# Patient Record
Sex: Male | Born: 1975 | Race: Black or African American | Hispanic: No | Marital: Married | Smoking: Former smoker
Health system: Southern US, Community
[De-identification: ages and names within clinical notes are randomized; demographics above are authoritative.]

## PROBLEM LIST (undated history)

## (undated) DIAGNOSIS — N189 Chronic kidney disease, unspecified: Secondary | ICD-10-CM

## (undated) DIAGNOSIS — M549 Dorsalgia, unspecified: Secondary | ICD-10-CM

## (undated) DIAGNOSIS — I499 Cardiac arrhythmia, unspecified: Secondary | ICD-10-CM

## (undated) DIAGNOSIS — E11621 Type 2 diabetes mellitus with foot ulcer: Secondary | ICD-10-CM

## (undated) DIAGNOSIS — G4730 Sleep apnea, unspecified: Secondary | ICD-10-CM

## (undated) DIAGNOSIS — R51 Headache: Secondary | ICD-10-CM

## (undated) DIAGNOSIS — H409 Unspecified glaucoma: Secondary | ICD-10-CM

## (undated) DIAGNOSIS — I10 Essential (primary) hypertension: Secondary | ICD-10-CM

## (undated) DIAGNOSIS — G709 Myoneural disorder, unspecified: Secondary | ICD-10-CM

## (undated) DIAGNOSIS — K219 Gastro-esophageal reflux disease without esophagitis: Secondary | ICD-10-CM

## (undated) DIAGNOSIS — E785 Hyperlipidemia, unspecified: Secondary | ICD-10-CM

## (undated) DIAGNOSIS — M1990 Unspecified osteoarthritis, unspecified site: Secondary | ICD-10-CM

## (undated) HISTORY — DX: Essential (primary) hypertension: I10

## (undated) HISTORY — DX: Dorsalgia, unspecified: M54.9

## (undated) HISTORY — DX: Myoneural disorder, unspecified: G70.9

## (undated) HISTORY — DX: Type 2 diabetes mellitus with foot ulcer: E11.621

## (undated) HISTORY — DX: Hyperlipidemia, unspecified: E78.5

## (undated) HISTORY — DX: Sleep apnea, unspecified: G47.30

## (undated) HISTORY — DX: Unspecified glaucoma: H40.9

## (undated) HISTORY — PX: OTHER SURGICAL HISTORY: SHX169

## (undated) HISTORY — DX: Chronic kidney disease, unspecified: N18.9

## (undated) HISTORY — DX: Headache: R51

## (undated) HISTORY — DX: Cardiac arrhythmia, unspecified: I49.9

## (undated) HISTORY — DX: Unspecified osteoarthritis, unspecified site: M19.90

## (undated) HISTORY — DX: Gastro-esophageal reflux disease without esophagitis: K21.9

---

## 1998-02-25 ENCOUNTER — Emergency Department (HOSPITAL_COMMUNITY): Admission: EM | Admit: 1998-02-25 | Discharge: 1998-02-25 | Payer: Self-pay | Admitting: Emergency Medicine

## 1998-09-29 ENCOUNTER — Emergency Department (HOSPITAL_COMMUNITY): Admission: EM | Admit: 1998-09-29 | Discharge: 1998-09-29 | Payer: Self-pay | Admitting: Emergency Medicine

## 1999-05-07 ENCOUNTER — Encounter (HOSPITAL_COMMUNITY): Admit: 1999-05-07 | Discharge: 1999-05-09 | Payer: Self-pay | Admitting: Pediatrics

## 1999-06-29 ENCOUNTER — Emergency Department (HOSPITAL_COMMUNITY): Admission: EM | Admit: 1999-06-29 | Discharge: 1999-06-29 | Payer: Self-pay | Admitting: Emergency Medicine

## 1999-10-18 ENCOUNTER — Emergency Department (HOSPITAL_COMMUNITY): Admission: EM | Admit: 1999-10-18 | Discharge: 1999-10-18 | Payer: Self-pay | Admitting: *Deleted

## 1999-10-19 ENCOUNTER — Emergency Department (HOSPITAL_COMMUNITY): Admission: EM | Admit: 1999-10-19 | Discharge: 1999-10-19 | Payer: Self-pay | Admitting: Emergency Medicine

## 2000-01-27 ENCOUNTER — Emergency Department (HOSPITAL_COMMUNITY): Admission: EM | Admit: 2000-01-27 | Discharge: 2000-01-27 | Payer: Self-pay | Admitting: Emergency Medicine

## 2000-02-25 ENCOUNTER — Emergency Department (HOSPITAL_COMMUNITY): Admission: EM | Admit: 2000-02-25 | Discharge: 2000-02-25 | Payer: Self-pay | Admitting: Emergency Medicine

## 2000-02-25 ENCOUNTER — Encounter: Payer: Self-pay | Admitting: Emergency Medicine

## 2000-08-01 ENCOUNTER — Emergency Department (HOSPITAL_COMMUNITY): Admission: EM | Admit: 2000-08-01 | Discharge: 2000-08-02 | Payer: Self-pay | Admitting: Emergency Medicine

## 2000-09-29 ENCOUNTER — Encounter: Payer: Self-pay | Admitting: Emergency Medicine

## 2000-09-29 ENCOUNTER — Emergency Department (HOSPITAL_COMMUNITY): Admission: EM | Admit: 2000-09-29 | Discharge: 2000-09-29 | Payer: Self-pay | Admitting: Emergency Medicine

## 2000-11-25 ENCOUNTER — Emergency Department (HOSPITAL_COMMUNITY): Admission: EM | Admit: 2000-11-25 | Discharge: 2000-11-25 | Payer: Self-pay | Admitting: *Deleted

## 2001-01-30 ENCOUNTER — Emergency Department (HOSPITAL_COMMUNITY): Admission: EM | Admit: 2001-01-30 | Discharge: 2001-01-30 | Payer: Self-pay | Admitting: Emergency Medicine

## 2001-02-01 ENCOUNTER — Emergency Department (HOSPITAL_COMMUNITY): Admission: EM | Admit: 2001-02-01 | Discharge: 2001-02-02 | Payer: Self-pay | Admitting: Emergency Medicine

## 2001-02-01 ENCOUNTER — Encounter: Payer: Self-pay | Admitting: Internal Medicine

## 2001-02-02 ENCOUNTER — Encounter: Payer: Self-pay | Admitting: Internal Medicine

## 2001-09-06 ENCOUNTER — Emergency Department (HOSPITAL_COMMUNITY): Admission: EM | Admit: 2001-09-06 | Discharge: 2001-09-06 | Payer: Self-pay | Admitting: Emergency Medicine

## 2002-01-28 ENCOUNTER — Emergency Department (HOSPITAL_COMMUNITY): Admission: EM | Admit: 2002-01-28 | Discharge: 2002-01-28 | Payer: Self-pay | Admitting: *Deleted

## 2002-11-24 ENCOUNTER — Encounter: Payer: Self-pay | Admitting: Emergency Medicine

## 2002-11-24 ENCOUNTER — Emergency Department (HOSPITAL_COMMUNITY): Admission: EM | Admit: 2002-11-24 | Discharge: 2002-11-24 | Payer: Self-pay | Admitting: Emergency Medicine

## 2003-08-05 ENCOUNTER — Emergency Department (HOSPITAL_COMMUNITY): Admission: EM | Admit: 2003-08-05 | Discharge: 2003-08-05 | Payer: Self-pay | Admitting: Emergency Medicine

## 2003-08-14 ENCOUNTER — Encounter: Admission: RE | Admit: 2003-08-14 | Discharge: 2003-08-14 | Payer: Self-pay | Admitting: Internal Medicine

## 2004-05-16 ENCOUNTER — Emergency Department (HOSPITAL_COMMUNITY): Admission: EM | Admit: 2004-05-16 | Discharge: 2004-05-16 | Payer: Self-pay | Admitting: Emergency Medicine

## 2004-05-18 ENCOUNTER — Ambulatory Visit: Payer: Self-pay | Admitting: Family Medicine

## 2004-05-24 ENCOUNTER — Ambulatory Visit: Payer: Self-pay | Admitting: Family Medicine

## 2004-06-07 ENCOUNTER — Ambulatory Visit: Payer: Self-pay | Admitting: Family Medicine

## 2004-06-21 ENCOUNTER — Encounter: Admission: RE | Admit: 2004-06-21 | Discharge: 2004-09-19 | Payer: Self-pay | Admitting: Family Medicine

## 2004-11-14 ENCOUNTER — Ambulatory Visit: Payer: Self-pay | Admitting: Family Medicine

## 2005-05-26 ENCOUNTER — Ambulatory Visit: Payer: Self-pay | Admitting: Family Medicine

## 2006-01-22 ENCOUNTER — Ambulatory Visit: Payer: Self-pay | Admitting: Family Medicine

## 2006-11-21 ENCOUNTER — Ambulatory Visit: Payer: Self-pay | Admitting: Family Medicine

## 2006-11-21 DIAGNOSIS — S335XXA Sprain of ligaments of lumbar spine, initial encounter: Secondary | ICD-10-CM | POA: Insufficient documentation

## 2006-11-21 DIAGNOSIS — E1140 Type 2 diabetes mellitus with diabetic neuropathy, unspecified: Secondary | ICD-10-CM

## 2006-11-21 DIAGNOSIS — Z794 Long term (current) use of insulin: Secondary | ICD-10-CM

## 2006-11-21 DIAGNOSIS — I10 Essential (primary) hypertension: Secondary | ICD-10-CM

## 2007-06-25 ENCOUNTER — Emergency Department (HOSPITAL_COMMUNITY): Admission: EM | Admit: 2007-06-25 | Discharge: 2007-06-26 | Payer: Self-pay | Admitting: Emergency Medicine

## 2008-04-15 ENCOUNTER — Telehealth: Payer: Self-pay | Admitting: Family Medicine

## 2008-04-17 ENCOUNTER — Ambulatory Visit: Payer: Self-pay | Admitting: Family Medicine

## 2008-04-17 LAB — CONVERTED CEMR LAB
Bilirubin Urine: NEGATIVE
Nitrite: NEGATIVE
Protein, U semiquant: NEGATIVE
Urobilinogen, UA: 0.2

## 2008-04-21 ENCOUNTER — Ambulatory Visit: Payer: Self-pay | Admitting: Family Medicine

## 2008-04-21 DIAGNOSIS — J45909 Unspecified asthma, uncomplicated: Secondary | ICD-10-CM | POA: Insufficient documentation

## 2008-04-21 DIAGNOSIS — R51 Headache: Secondary | ICD-10-CM | POA: Insufficient documentation

## 2008-04-21 DIAGNOSIS — R519 Headache, unspecified: Secondary | ICD-10-CM | POA: Insufficient documentation

## 2008-04-21 DIAGNOSIS — K219 Gastro-esophageal reflux disease without esophagitis: Secondary | ICD-10-CM | POA: Insufficient documentation

## 2008-04-21 LAB — CONVERTED CEMR LAB
ALT: 25 units/L (ref 0–53)
AST: 18 units/L (ref 0–37)
Alkaline Phosphatase: 81 units/L (ref 39–117)
Basophils Absolute: 0 10*3/uL (ref 0.0–0.1)
Bilirubin, Direct: 0.3 mg/dL (ref 0.0–0.3)
CO2: 30 meq/L (ref 19–32)
Chloride: 94 meq/L — ABNORMAL LOW (ref 96–112)
Cholesterol: 137 mg/dL (ref 0–200)
LDL Cholesterol: 71 mg/dL (ref 0–99)
Lymphocytes Relative: 33 % (ref 12.0–46.0)
MCHC: 35.6 g/dL (ref 30.0–36.0)
Microalb, Ur: 0.9 mg/dL (ref 0.0–1.9)
Monocytes Relative: 5.1 % (ref 3.0–12.0)
Neutrophils Relative %: 61.4 % (ref 43.0–77.0)
RBC: 5.27 M/uL (ref 4.22–5.81)
RDW: 11.7 % (ref 11.5–14.6)
Sodium: 134 meq/L — ABNORMAL LOW (ref 135–145)
TSH: 1.38 microintl units/mL (ref 0.35–5.50)
Total Bilirubin: 1.2 mg/dL (ref 0.3–1.2)
Total CHOL/HDL Ratio: 5.2
Triglycerides: 197 mg/dL — ABNORMAL HIGH (ref 0–149)
VLDL: 39 mg/dL (ref 0–40)

## 2008-05-07 ENCOUNTER — Ambulatory Visit: Payer: Self-pay | Admitting: Family Medicine

## 2008-05-07 LAB — CONVERTED CEMR LAB: Blood Glucose, Fingerstick: 381

## 2009-08-09 ENCOUNTER — Emergency Department (HOSPITAL_COMMUNITY): Admission: EM | Admit: 2009-08-09 | Discharge: 2009-08-09 | Payer: Self-pay | Admitting: Emergency Medicine

## 2010-04-15 ENCOUNTER — Telehealth: Payer: Self-pay | Admitting: Hospitalist

## 2010-04-15 NOTE — Telephone Encounter (Signed)
Pt wants to have cpx in march..... Can't wait till next available in April..... Okay to work into schedule?Marland Kitchen... Pts contact # [phone].

## 2010-04-18 ENCOUNTER — Other Ambulatory Visit: Payer: Self-pay | Admitting: Family Medicine

## 2010-04-18 DIAGNOSIS — Z0000 Encounter for general adult medical examination without abnormal findings: Secondary | ICD-10-CM

## 2010-04-18 LAB — POCT URINALYSIS DIPSTICK
Leukocytes, UA: NEGATIVE
Nitrite, UA: NEGATIVE
Urobilinogen, UA: 0.2

## 2010-04-18 LAB — HEPATIC FUNCTION PANEL
ALT: 16 U/L (ref 0–53)
Bilirubin, Direct: 0.2 mg/dL (ref 0.0–0.3)
Total Bilirubin: 0.5 mg/dL (ref 0.3–1.2)

## 2010-04-18 LAB — LIPID PANEL
Total CHOL/HDL Ratio: 4
Triglycerides: 124 mg/dL (ref 0.0–149.0)

## 2010-04-18 LAB — CBC WITH DIFFERENTIAL/PLATELET
Basophils Relative: 0.3 % (ref 0.0–3.0)
Eosinophils Absolute: 0.1 10*3/uL (ref 0.0–0.7)
MCHC: 34 g/dL (ref 30.0–36.0)
MCV: 94.2 fl (ref 78.0–100.0)
Monocytes Absolute: 0.5 10*3/uL (ref 0.1–1.0)
Neutro Abs: 4.9 10*3/uL (ref 1.4–7.7)
Neutrophils Relative %: 50.5 % (ref 43.0–77.0)
RBC: 5.22 Mil/uL (ref 4.22–5.81)

## 2010-04-18 LAB — BASIC METABOLIC PANEL
Chloride: 98 mEq/L (ref 96–112)
Creatinine, Ser: 0.9 mg/dL (ref 0.4–1.5)
Potassium: 4.5 mEq/L (ref 3.5–5.1)
Sodium: 133 mEq/L — ABNORMAL LOW (ref 135–145)

## 2010-04-18 LAB — MICROALBUMIN / CREATININE URINE RATIO: Microalb, Ur: 5.3 mg/dL — ABNORMAL HIGH (ref 0.0–1.9)

## 2010-04-18 LAB — TSH: TSH: 0.99 u[IU]/mL (ref 0.35–5.50)

## 2010-04-18 NOTE — Telephone Encounter (Signed)
Okay to set this up

## 2010-04-21 ENCOUNTER — Encounter: Payer: Self-pay | Admitting: Family Medicine

## 2010-04-22 ENCOUNTER — Ambulatory Visit (INDEPENDENT_AMBULATORY_CARE_PROVIDER_SITE_OTHER): Payer: Self-pay | Admitting: Family Medicine

## 2010-04-22 ENCOUNTER — Encounter: Payer: Self-pay | Admitting: Family Medicine

## 2010-04-22 VITALS — BP 120/84 | Temp 98.2°F | Ht 69.0 in | Wt 238.0 lb

## 2010-04-22 DIAGNOSIS — E119 Type 2 diabetes mellitus without complications: Secondary | ICD-10-CM

## 2010-04-22 DIAGNOSIS — Z0000 Encounter for general adult medical examination without abnormal findings: Secondary | ICD-10-CM

## 2010-04-22 MED ORDER — CYCLOBENZAPRINE HCL 10 MG PO TABS: 10.0000 mg | ORAL_TABLET | Freq: Three times a day (TID) | ORAL | Status: AC | PRN

## 2010-04-22 MED ORDER — METFORMIN HCL 1000 MG PO TABS: 1000.0000 mg | ORAL_TABLET | Freq: Two times a day (BID) | ORAL | Status: DC

## 2010-04-22 MED ORDER — GLIPIZIDE 10 MG PO TABS: 10.0000 mg | ORAL_TABLET | Freq: Two times a day (BID) | ORAL | Status: DC

## 2010-04-22 NOTE — Progress Notes (Signed)
  Subjective:    Patient ID: Danny Fuller, male    DOB: January 09, 1976, 35 y.o.   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 I have not seen him for 2 years. At our last visit his DM was out of control, and his A1c on 05-07-08 was 13.6. He was not following any sort of diet at that time. He now wants to re-establish with [name] and to work hard to get his diabetes under control. His labs on 04-18-10 showed an A1c of 12.6, although otherwise they were normal. He has made some dietary changes with the help[ of his wife, and he has lost a lot of weight in the past 6 months. He feels fine. He has not taken any meds except Flexeril for the past 2 years.    Review of Systems  Constitutional: Negative.   HENT: Negative.   Eyes: Negative.   Respiratory: Negative.   Cardiovascular: Negative.   Gastrointestinal: Negative.   Genitourinary: Negative.   Musculoskeletal: Negative.   Skin: Negative.   Neurological: Negative.   Hematological: Negative.   Psychiatric/Behavioral: Negative.        Objective:   Physical Exam  Constitutional: He is oriented to person, place, and time. He appears well-developed and well-nourished. No distress.  HENT:  Head: Normocephalic and atraumatic.  Right Ear: External ear normal.  Left Ear: External ear normal.  Nose: Nose normal.  Mouth/Throat: Oropharynx is clear and moist. No oropharyngeal exudate.  Eyes: Conjunctivae and EOM are normal. Pupils are equal, round, and reactive to light. Right eye exhibits no discharge. Left eye exhibits no discharge. No scleral icterus.  Neck: Neck supple. No JVD present. No tracheal deviation present. No thyromegaly present.  Cardiovascular: Normal rate, regular rhythm, normal heart sounds and intact distal pulses.  Exam reveals no gallop and no friction rub.   No murmur heard. Pulmonary/Chest: Effort normal and breath sounds normal. No respiratory distress. He has no wheezes. He has no  rales. He exhibits no tenderness.  Abdominal: Soft. Bowel sounds are normal. He exhibits no distension and no mass. There is no tenderness. There is no rebound and no guarding.  Musculoskeletal: Normal range of motion. He exhibits no edema and no tenderness.  Lymphadenopathy:    He has no cervical adenopathy.  Neurological: He is alert and oriented to person, place, and time. He has normal reflexes. No cranial nerve deficit. He exhibits normal muscle tone. Coordination normal.  Skin: Skin is warm and dry. No rash noted. He is not diaphoretic. No erythema. No pallor.  Psychiatric: He has a normal mood and affect. His behavior is normal. Judgment and thought content normal.          Assessment & Plan:  He is passed for 2 years on the DOT certificate. He will continue diet and exercise. We will start on metformin and glipizide. Recheck in one month

## 2010-05-01 LAB — URINE CULTURE
Colony Count: NO GROWTH
Culture: NO GROWTH

## 2010-05-01 LAB — URINE MICROSCOPIC-ADD ON

## 2010-05-01 LAB — URINALYSIS, ROUTINE W REFLEX MICROSCOPIC
Glucose, UA: >1000 mg/dL — AB
Hgb urine dipstick: NEGATIVE
Leukocytes, UA: NEGATIVE
Specific Gravity, Urine: >1.046 — ABNORMAL HIGH (ref 1.005–1.030)
pH: 6 (ref 5.0–8.0)

## 2010-05-27 ENCOUNTER — Ambulatory Visit: Payer: Self-pay | Admitting: Family Medicine

## 2010-05-31 ENCOUNTER — Ambulatory Visit (INDEPENDENT_AMBULATORY_CARE_PROVIDER_SITE_OTHER): Payer: Self-pay | Admitting: Family Medicine

## 2010-05-31 ENCOUNTER — Encounter: Payer: Self-pay | Admitting: Family Medicine

## 2010-05-31 VITALS — BP 140/96 | HR 98 | Temp 98.4°F | Wt 241.0 lb

## 2010-05-31 DIAGNOSIS — E119 Type 2 diabetes mellitus without complications: Secondary | ICD-10-CM

## 2010-05-31 NOTE — Progress Notes (Signed)
  Subjective:    Patient ID: Danny Fuller, male    DOB: [birth year], 35 y.o.   MRN: [id number]  HPI Here to follow up on diabetes. We saw him 6 weeks ago after an absence of 2 years. His A1c was 12.6. We started him on Metformin and Glipizide at that time. He has been taking these regularly. He has not been able to afford a glucometer, so his glucose has not been checked since he was here. He feels fine except for some sleepiness at times. After talking to him, he has not made any real changes to his diet.    Review of Systems  Constitutional: Negative.   Respiratory: Negative.   Cardiovascular: Negative.        Objective:   Physical Exam  Constitutional: He appears well-developed and well-nourished.  Cardiovascular: Normal rate, regular rhythm, normal heart sounds and intact distal pulses.   Pulmonary/Chest: Effort normal and breath sounds normal.          Assessment & Plan:  I spent 30 minutes talking to him about the serious nature of his diabetes. He needs to make some major changes in his diet and exercise. 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 He will work on this and see [name] in 2 months.

## 2010-08-05 ENCOUNTER — Ambulatory Visit: Payer: Self-pay | Admitting: Family Medicine

## 2010-09-02 ENCOUNTER — Ambulatory Visit: Payer: Self-pay | Admitting: Family Medicine

## 2010-09-02 DIAGNOSIS — Z0289 Encounter for other administrative examinations: Secondary | ICD-10-CM

## 2010-11-09 ENCOUNTER — Emergency Department (HOSPITAL_COMMUNITY)
Admission: EM | Admit: 2010-11-09 | Discharge: 2010-11-09 | Disposition: A | Discharge status: Home / Self Care | Payer: Self-pay | Attending: Emergency Medicine | Admitting: Emergency Medicine

## 2010-11-09 ENCOUNTER — Emergency Department (HOSPITAL_COMMUNITY): Payer: Self-pay

## 2010-11-09 DIAGNOSIS — IMO0002 Reserved for concepts with insufficient information to code with codable children: Secondary | ICD-10-CM | POA: Insufficient documentation

## 2010-11-09 DIAGNOSIS — X500XXA Overexertion from strenuous movement or load, initial encounter: Secondary | ICD-10-CM | POA: Insufficient documentation

## 2011-07-05 ENCOUNTER — Telehealth: Payer: Self-pay | Admitting: Family Medicine

## 2011-07-07 ENCOUNTER — Encounter: Payer: Self-pay | Admitting: Family Medicine

## 2011-07-07 ENCOUNTER — Ambulatory Visit (INDEPENDENT_AMBULATORY_CARE_PROVIDER_SITE_OTHER): Payer: Self-pay | Admitting: Family Medicine

## 2011-07-07 VITALS — BP 146/86 | HR 105 | Temp 98.2°F | Wt 232.0 lb

## 2011-07-07 DIAGNOSIS — M79609 Pain in unspecified limb: Secondary | ICD-10-CM

## 2011-07-07 DIAGNOSIS — E119 Type 2 diabetes mellitus without complications: Secondary | ICD-10-CM

## 2011-07-07 DIAGNOSIS — M79673 Pain in unspecified foot: Secondary | ICD-10-CM

## 2011-07-07 NOTE — Progress Notes (Signed)
  Subjective:    Patient ID: Danny Fuller, male    DOB: 1975/09/18, 36 y.o.   MRN: [id number]  HPI Here for painful feet, the worse one being the left foot. This has bothered him for a year or more. He has several painful spots on the sole of the foot, and it hurts to walk on it. OTC arch supports do not help. He has not had labs to check his DM for over a year.    Review of Systems  Constitutional: Negative.   Respiratory: Negative.   Cardiovascular: Negative.        Objective:   Physical Exam  Constitutional: He appears well-developed and well-nourished.  Cardiovascular: Normal rate, regular rhythm, normal heart sounds and intact distal pulses.   Pulmonary/Chest: Effort normal and breath sounds normal.  Musculoskeletal:       The left foot has a tender bunion under the 1st metatarsal head as well as a tender wart near the heel           Assessment & Plan:  Refer to Podiatry. Set up fasting labs soon

## 2011-07-14 ENCOUNTER — Other Ambulatory Visit (INDEPENDENT_AMBULATORY_CARE_PROVIDER_SITE_OTHER): Payer: Self-pay

## 2011-07-14 DIAGNOSIS — E119 Type 2 diabetes mellitus without complications: Secondary | ICD-10-CM

## 2011-07-14 LAB — MICROALBUMIN / CREATININE URINE RATIO
Creatinine,U: 156.2 mg/dL
Microalb, Ur: 2.9 mg/dL — ABNORMAL HIGH (ref 0.0–1.9)

## 2011-07-14 LAB — HEPATIC FUNCTION PANEL
AST: 13 U/L (ref 0–37)
Albumin: 3.9 g/dL (ref 3.5–5.2)
Alkaline Phosphatase: 72 U/L (ref 39–117)
Bilirubin, Direct: 0.1 mg/dL (ref 0.0–0.3)
Total Protein: 7.1 g/dL (ref 6.0–8.3)

## 2011-07-14 LAB — CBC WITH DIFFERENTIAL/PLATELET
Basophils Absolute: 0 10*3/uL (ref 0.0–0.1)
Basophils Relative: 0.4 % (ref 0.0–3.0)
Eosinophils Absolute: 0.1 10*3/uL (ref 0.0–0.7)
Lymphocytes Relative: 47.7 % — ABNORMAL HIGH (ref 12.0–46.0)
MCHC: 33.3 g/dL (ref 30.0–36.0)
MCV: 92.6 fl (ref 78.0–100.0)
Monocytes Absolute: 0.5 10*3/uL (ref 0.1–1.0)
Neutro Abs: 4.2 10*3/uL (ref 1.4–7.7)
Neutrophils Relative %: 45.9 % (ref 43.0–77.0)
RBC: 5.02 Mil/uL (ref 4.22–5.81)
RDW: 12.3 % (ref 11.5–14.6)

## 2011-07-14 LAB — LIPID PANEL: Total CHOL/HDL Ratio: 4

## 2011-07-14 LAB — BASIC METABOLIC PANEL
CO2: 26 mEq/L (ref 19–32)
Chloride: 103 mEq/L (ref 96–112)
Glucose, Bld: 315 mg/dL — ABNORMAL HIGH (ref 70–99)
Potassium: 3.9 mEq/L (ref 3.5–5.1)
Sodium: 137 mEq/L (ref 135–145)

## 2011-07-17 NOTE — Progress Notes (Signed)
Quick Note:  I left voice message for pt to return my call. ______ 

## 2011-07-18 ENCOUNTER — Encounter: Payer: Self-pay | Admitting: Family Medicine

## 2011-07-18 MED ORDER — LISINOPRIL 10 MG PO TABS: 10.0000 mg | ORAL_TABLET | Freq: Every day | ORAL | Status: DC

## 2011-07-18 MED ORDER — GLIPIZIDE 10 MG PO TABS: 10.0000 mg | ORAL_TABLET | Freq: Two times a day (BID) | ORAL | Status: DC

## 2011-07-18 NOTE — Progress Notes (Signed)
Addended by: Aniceto Boss A on: 07/18/2011 05:08 PM   Modules accepted: Orders

## 2011-07-18 NOTE — Progress Notes (Signed)
Quick Note:  I tried to reach pt again today, no answer. I left a voice message with below information, sent in 2 scripts e-scribe, put a copy of results in mail to pt and advised him to call us when he gets this in the mail and we can go over this information. ______

## 2011-07-19 ENCOUNTER — Telehealth: Payer: Self-pay | Admitting: Family Medicine

## 2011-07-19 NOTE — Telephone Encounter (Signed)
Pt wanted to change pharmacies in computer to [facility], which I did.

## 2011-07-24 ENCOUNTER — Encounter (HOSPITAL_COMMUNITY): Payer: Self-pay | Admitting: *Deleted

## 2011-07-24 ENCOUNTER — Emergency Department (HOSPITAL_COMMUNITY)
Admission: EM | Admit: 2011-07-24 | Discharge: 2011-07-25 | Disposition: A | Discharge status: Home / Self Care | Payer: Self-pay | Attending: Emergency Medicine | Admitting: Emergency Medicine

## 2011-07-24 ENCOUNTER — Emergency Department (HOSPITAL_COMMUNITY): Payer: Self-pay

## 2011-07-24 DIAGNOSIS — S6980XA Other specified injuries of unspecified wrist, hand and finger(s), initial encounter: Secondary | ICD-10-CM | POA: Insufficient documentation

## 2011-07-24 DIAGNOSIS — Z79899 Other long term (current) drug therapy: Secondary | ICD-10-CM | POA: Insufficient documentation

## 2011-07-24 DIAGNOSIS — Y92009 Unspecified place in unspecified non-institutional (private) residence as the place of occurrence of the external cause: Secondary | ICD-10-CM | POA: Insufficient documentation

## 2011-07-24 DIAGNOSIS — I10 Essential (primary) hypertension: Secondary | ICD-10-CM | POA: Insufficient documentation

## 2011-07-24 DIAGNOSIS — S6992XA Unspecified injury of left wrist, hand and finger(s), initial encounter: Secondary | ICD-10-CM

## 2011-07-24 DIAGNOSIS — IMO0002 Reserved for concepts with insufficient information to code with codable children: Secondary | ICD-10-CM | POA: Insufficient documentation

## 2011-07-24 DIAGNOSIS — F17200 Nicotine dependence, unspecified, uncomplicated: Secondary | ICD-10-CM | POA: Insufficient documentation

## 2011-07-24 DIAGNOSIS — E119 Type 2 diabetes mellitus without complications: Secondary | ICD-10-CM | POA: Insufficient documentation

## 2011-07-24 DIAGNOSIS — S6990XA Unspecified injury of unspecified wrist, hand and finger(s), initial encounter: Secondary | ICD-10-CM | POA: Insufficient documentation

## 2011-07-24 DIAGNOSIS — K219 Gastro-esophageal reflux disease without esophagitis: Secondary | ICD-10-CM | POA: Insufficient documentation

## 2011-07-24 NOTE — ED Notes (Signed)
The pt injured his lt thumb earlier today on a paper towel rack.  He has had a break in that thumb 2 years ago

## 2011-07-25 ENCOUNTER — Emergency Department (HOSPITAL_COMMUNITY): Payer: Self-pay

## 2011-07-25 MED ORDER — IBUPROFEN 800 MG PO TABS
800.0000 mg | ORAL_TABLET | Freq: Once | ORAL | Status: AC
  Administered 2011-07-25: 800 mg via ORAL
  Filled 2011-07-25: qty 1

## 2011-07-25 MED ORDER — HYDROCODONE-ACETAMINOPHEN 5-325 MG PO TABS: 1.0000 | ORAL_TABLET | ORAL | Status: AC | PRN

## 2011-07-25 MED ORDER — HYDROCODONE-ACETAMINOPHEN 5-325 MG PO TABS
1.0000 | ORAL_TABLET | Freq: Once | ORAL | Status: AC
  Administered 2011-07-25: 1 via ORAL
  Filled 2011-07-25: qty 1

## 2011-07-25 NOTE — ED Notes (Signed)
Pt discharge home.Pain improved but still present.Discharge instruction given.GCS 15.Ambulating well.

## 2011-07-25 NOTE — ED Notes (Signed)
Pt presented to ED with injury of the left thumb.GCS 15

## 2011-07-25 NOTE — Discharge Instructions (Signed)
Wear splint for the next week. Followup with orthopedist listed above if you have continued pain after one week.

## 2011-07-26 NOTE — ED Provider Notes (Signed)
History     CSN: [id number]  Arrival date & time 07/24/11  2224   First MD Initiated Contact with Patient 07/25/11 0009      Chief Complaint  Patient presents with  . thumb injury     (Consider location/radiation/quality/duration/timing/severity/associated sxs/prior treatment) HPI 36 year old male presents to emergency room complaining of left thumb pain. Patient reports he struck it earlier on the side of a door, now is having pain to index finger as well. Patient is right-hand dominant. He reports previous fracture to his left thumb. No other injury or complaints at this time  Past Medical History  Diagnosis Date  . Diabetes mellitus   . Hypertension   . Asthma   . GERD (gastroesophageal reflux disease)   . Headache     Past Surgical History  Procedure Date  . Bilateral hip pins placed     Family History  Problem Relation Age of Onset  . Arthritis    . Diabetes    . Hypertension    . Hyperlipidemia    . Stroke    . Sudden death      History  Substance Use Topics  . Smoking status: Current Everyday Smoker -- 0.5 packs/day    Types: Cigarettes  . Smokeless tobacco: Never Used  . Alcohol Use: Yes     once or twice a month      Review of Systems  All other systems reviewed and are negative.   other than listed in history of present illness  Allergies  Review of patient's allergies indicates no known allergies.  Home Medications   Current Outpatient Rx  Name Route Sig Dispense Refill  . IBUPROFEN 200 MG PO TABS Oral Take 600 mg by mouth every 6 (six) hours as needed. As needed for pain.    Marland Kitchen METFORMIN HCL 1000 MG PO TABS Oral Take 1 tablet (1,000 mg total) by mouth 2 (two) times daily with a meal. 60 tablet 11  . GLIPIZIDE 10 MG PO TABS Oral Take 1 tablet (10 mg total) by mouth 2 (two) times daily. 60 tablet 11  . HYDROCODONE-ACETAMINOPHEN 5-325 MG PO TABS Oral Take 1 tablet by mouth every 4 (four) hours as needed for pain. 15 tablet 0    BP 131/90   Pulse 86  Temp 99.6 F (37.6 C) (Oral)  Resp 18  SpO2 99%  Physical Exam  Constitutional: He is oriented to person, place, and time. He appears well-developed and well-nourished. Distressed: uncomfortable-appearing.  HENT:  Head: Normocephalic and atraumatic.  Musculoskeletal: Normal range of motion. He exhibits tenderness (tenderness with palp over right thenar eminence, also along metacarpal joint and carpal joint. No snuffbox tenderness. Pt also w tenderness to palp over index finger, MCP and carpal joints of first finger. No effusion, crepitus or deformity noted). He exhibits no edema.  Neurological: He is alert and oriented to person, place, and time.  Skin: Skin is warm and dry. No rash noted. No erythema. No pallor.  Psychiatric: He has a normal mood and affect. His behavior is normal. Judgment and thought content normal.    ED Course  Procedures (including critical care time)  Labs Reviewed - No data to display Dg Hand Complete Left  07/25/2011  *RADIOLOGY REPORT*  Clinical Data: Hand pain  LEFT HAND - COMPLETE 3+ VIEW  Comparison: 07/24/2011 thumb radiographs  Findings: Mild irregularity at the first metacarpal head along the metacarpal phalangeal joint is again noted though less concerning. May be sequelae of prior trauma or  degenerative change.  No acute fracture or dislocation identified.  No aggressive osseous lesion.  IMPRESSION: No acute osseous abnormality identified. If clinical concern for a fracture persists, recommend a repeat radiograph in 5-10 days to evaluate for interval change or callus formation.  Original Report Authenticated By: Waneta Martins, M.D.   Dg Finger Thumb Left  07/25/2011  *RADIOLOGY REPORT*  Clinical Data: Left thumb pain status post trauma.  LEFT THUMB 2+V  Comparison: None.  Findings:  Mild irregularity of the first metacarpal head along its radial margin abutting the metacarpal phalangeal joint as seen on the oblique view. Otherwise, no  definite acute fracture or dislocation identified.  IMPRESSION: Mild irregularity of the first metacarpal head along its radial margin abutting the metacarpal phalangeal joint. This may be secondary to prior trauma however correlate with point tenderness to exclude acute injury.   Original Report Authenticated By: Waneta Martins, M.D.     1. Injury of left thumb       MDM  Suspect contusion or mild strain to left thumb. We'll place in thumb spica for comfort and followup with orthopedics as needed.        Olivia Mackie, MD 07/26/11 [phone]

## 2011-08-30 NOTE — Telephone Encounter (Signed)
test

## 2012-02-08 ENCOUNTER — Emergency Department (HOSPITAL_COMMUNITY): Payer: Self-pay

## 2012-02-08 ENCOUNTER — Emergency Department (HOSPITAL_COMMUNITY)
Admission: EM | Admit: 2012-02-08 | Discharge: 2012-02-08 | Disposition: A | Discharge status: Home / Self Care | Payer: Self-pay | Attending: Emergency Medicine | Admitting: Emergency Medicine

## 2012-02-08 ENCOUNTER — Encounter (HOSPITAL_COMMUNITY): Payer: Self-pay | Admitting: Emergency Medicine

## 2012-02-08 DIAGNOSIS — IMO0001 Reserved for inherently not codable concepts without codable children: Secondary | ICD-10-CM | POA: Insufficient documentation

## 2012-02-08 DIAGNOSIS — J111 Influenza due to unidentified influenza virus with other respiratory manifestations: Secondary | ICD-10-CM | POA: Insufficient documentation

## 2012-02-08 DIAGNOSIS — R0789 Other chest pain: Secondary | ICD-10-CM | POA: Insufficient documentation

## 2012-02-08 DIAGNOSIS — Z79899 Other long term (current) drug therapy: Secondary | ICD-10-CM | POA: Insufficient documentation

## 2012-02-08 DIAGNOSIS — R51 Headache: Secondary | ICD-10-CM | POA: Insufficient documentation

## 2012-02-08 DIAGNOSIS — I10 Essential (primary) hypertension: Secondary | ICD-10-CM | POA: Insufficient documentation

## 2012-02-08 DIAGNOSIS — K219 Gastro-esophageal reflux disease without esophagitis: Secondary | ICD-10-CM | POA: Insufficient documentation

## 2012-02-08 DIAGNOSIS — F17200 Nicotine dependence, unspecified, uncomplicated: Secondary | ICD-10-CM | POA: Insufficient documentation

## 2012-02-08 DIAGNOSIS — J3489 Other specified disorders of nose and nasal sinuses: Secondary | ICD-10-CM | POA: Insufficient documentation

## 2012-02-08 DIAGNOSIS — E119 Type 2 diabetes mellitus without complications: Secondary | ICD-10-CM | POA: Insufficient documentation

## 2012-02-08 DIAGNOSIS — R509 Fever, unspecified: Secondary | ICD-10-CM | POA: Insufficient documentation

## 2012-02-08 DIAGNOSIS — H9209 Otalgia, unspecified ear: Secondary | ICD-10-CM | POA: Insufficient documentation

## 2012-02-08 LAB — COMPREHENSIVE METABOLIC PANEL
Albumin: 3.7 g/dL (ref 3.5–5.2)
Alkaline Phosphatase: 78 U/L (ref 39–117)
BUN: 10 mg/dL (ref 6–23)
Calcium: 9.4 mg/dL (ref 8.4–10.5)
Creatinine, Ser: 0.64 mg/dL (ref 0.50–1.35)
GFR calc Af Amer: >90 mL/min (ref >90)
Glucose, Bld: 275 mg/dL — ABNORMAL HIGH (ref 70–99)
Total Protein: 7.5 g/dL (ref 6.0–8.3)

## 2012-02-08 LAB — CBC WITH DIFFERENTIAL/PLATELET
Basophils Relative: 1 % (ref 0–1)
Eosinophils Absolute: 0 10*3/uL (ref 0.0–0.7)
Eosinophils Relative: 0 % (ref 0–5)
Hemoglobin: 15.6 g/dL (ref 13.0–17.0)
Lymphs Abs: 1.4 10*3/uL (ref 0.7–4.0)
MCH: 30.6 pg (ref 26.0–34.0)
MCHC: 35.8 g/dL (ref 30.0–36.0)
MCV: 85.5 fL (ref 78.0–100.0)
Monocytes Relative: 12 % (ref 3–12)
RBC: 5.1 MIL/uL (ref 4.22–5.81)

## 2012-02-08 MED ORDER — OSELTAMIVIR PHOSPHATE 75 MG PO CAPS: 75.0000 mg | ORAL_CAPSULE | Freq: Two times a day (BID) | ORAL | Status: DC

## 2012-02-08 MED ORDER — ACETAMINOPHEN 325 MG PO TABS
650.0000 mg | ORAL_TABLET | Freq: Once | ORAL | Status: DC
  Filled 2012-02-08: qty 2

## 2012-02-08 NOTE — ED Notes (Signed)
C/o chest and throat pain, states cough but hurts to cough, denies nausea, vomiting and diarrhea, coughing up yellow mucus

## 2012-02-08 NOTE — ED Provider Notes (Addendum)
History     CSN: [id number]  Arrival date & time 02/08/12  1401   First MD Initiated Contact with Patient 02/08/12 1503      Chief Complaint  Patient presents with  . Cough  . Chest Pain  . Fever    (Consider location/radiation/quality/duration/timing/severity/associated sxs/prior treatment) Patient is a 36 y.o. male presenting with cough, chest pain, and fever. The history is provided by the patient.  Cough This is a new problem. The current episode started yesterday. The problem occurs constantly. The cough is non-productive. The maximum temperature recorded prior to his arrival was 101 to 101.9 F. The fever has been present for less than 1 day. Associated symptoms include chest pain, chills, ear pain, headaches, rhinorrhea and myalgias. Pertinent negatives include no shortness of breath. He has tried nothing for the symptoms. The treatment provided no relief. He is a smoker. Past medical history comments: DM.  Chest Pain Primary symptoms include a fever and cough. Pertinent negatives for primary symptoms include no shortness of breath, no nausea and no vomiting. Past medical history comments: DM    Fever Primary symptoms of the febrile illness include fever, headaches, cough and myalgias. Primary symptoms do not include shortness of breath, nausea, vomiting, diarrhea or dysuria.    Past Medical History  Diagnosis Date  . Diabetes mellitus   . Hypertension   . Asthma   . GERD (gastroesophageal reflux disease)   . Headache     Past Surgical History  Procedure Date  . Bilateral hip pins placed     Family History  Problem Relation Age of Onset  . Arthritis    . Diabetes    . Hypertension    . Hyperlipidemia    . Stroke    . Sudden death      History  Substance Use Topics  . Smoking status: Current Every Day Smoker -- 0.5 packs/day    Types: Cigarettes  . Smokeless tobacco: Never Used  . Alcohol Use: Yes     Comment: once or twice a month      Review of  Systems  Constitutional: Positive for fever and chills.  HENT: Positive for ear pain and rhinorrhea.   Respiratory: Positive for cough. Negative for shortness of breath.   Cardiovascular: Positive for chest pain.       Pain in the chest as well as the restof the body.  Pleuritic in nature and worse with coughing  Gastrointestinal: Negative for nausea, vomiting and diarrhea.  Genitourinary: Negative for dysuria.  Musculoskeletal: Positive for myalgias.  Neurological: Positive for headaches.  All other systems reviewed and are negative.    Allergies  Review of patient's allergies indicates no known allergies.  Home Medications   Current Outpatient Rx  Name  Route  Sig  Dispense  Refill  . IBUPROFEN 200 MG PO TABS   Oral   Take 600 mg by mouth every 6 (six) hours as needed. As needed for pain.         Marland Kitchen METFORMIN HCL 1000 MG PO TABS   Oral   Take 1 tablet (1,000 mg total) by mouth 2 (two) times daily with a meal.   60 tablet   11   . GLIPIZIDE 10 MG PO TABS   Oral   Take 1 tablet (10 mg total) by mouth 2 (two) times daily.   60 tablet   11     BP 136/69  Pulse 109  Temp 101.9 F (38.8 C)  Resp 16  SpO2 100%  Physical Exam  Nursing note and vitals reviewed. Constitutional: He is oriented to person, place, and time. He appears well-developed and well-nourished. No distress.  HENT:  Head: Normocephalic and atraumatic.  Right Ear: Tympanic membrane and ear canal normal.  Left Ear: Tympanic membrane and ear canal normal.  Mouth/Throat: Mucous membranes are normal. Posterior oropharyngeal erythema present. No oropharyngeal exudate, posterior oropharyngeal edema or tonsillar abscesses.  Eyes: Conjunctivae normal and EOM are normal. Pupils are equal, round, and reactive to light.  Neck: Normal range of motion. Neck supple. No spinous process tenderness and no muscular tenderness present. No Brudzinski's sign and no Kernig's sign noted.  Cardiovascular: Normal rate,  regular rhythm and intact distal pulses.   No murmur heard. Pulmonary/Chest: Effort normal and breath sounds normal. No respiratory distress. He has no wheezes. He has no rales.  Abdominal: Soft. He exhibits no distension. There is no tenderness. There is no rebound and no guarding.  Musculoskeletal: Normal range of motion. He exhibits no edema and no tenderness.  Lymphadenopathy:    He has no cervical adenopathy.  Neurological: He is alert and oriented to person, place, and time.  Skin: Skin is warm and dry. No rash noted. No erythema.  Psychiatric: He has a normal mood and affect. His behavior is normal.    ED Course  Procedures (including critical care time)   Labs Reviewed  CBC WITH DIFFERENTIAL  COMPREHENSIVE METABOLIC PANEL   Dg Chest 2 View  02/08/2012  *RADIOLOGY REPORT*  Clinical Data: Cough, congestion, fever and weakness.  CHEST - 2 VIEW  Comparison: None.  Findings: The cardiac silhouette, mediastinal and hilar contours are normal.  The lungs are clear.  Streaky band of subsegmental atelectasis noted of the left lung base.  No effusions or edema. The bony thorax is intact.  IMPRESSION: No acute cardiopulmonary findings.   Original Report Authenticated By: Rudie Meyer, M.D.      1. Influenza       MDM   Pt with symptoms consistent with influenza.  Normal exam here but is febrile.  No signs of breathing difficulty  No signs of strep pharyngitis, otitis or abnormal abdominal findings.   CXR wnl .  Will continue antipyretica, tamiflu (due to early onset and hx of DM) and rest and fluids and return for any further problems.         Gwyneth Sprout, MD 02/08/12 1523  Gwyneth Sprout, MD 02/08/12 [phone]

## 2012-02-08 NOTE — ED Notes (Signed)
Pt c/o cough, congestion and chest pain since last pm, denies nausea, vomiting, diarrhea, started last pm

## 2012-04-16 ENCOUNTER — Ambulatory Visit: Payer: Self-pay | Admitting: Family Medicine

## 2012-04-21 ENCOUNTER — Ambulatory Visit: Payer: Self-pay | Admitting: Internal Medicine

## 2012-04-21 VITALS — BP 162/92 | HR 84 | Temp 98.1°F | Resp 16 | Ht 69.5 in | Wt 230.0 lb

## 2012-04-21 DIAGNOSIS — Z0289 Encounter for other administrative examinations: Secondary | ICD-10-CM

## 2012-04-21 DIAGNOSIS — Z6833 Body mass index (BMI) 33.0-33.9, adult: Secondary | ICD-10-CM | POA: Insufficient documentation

## 2012-04-21 NOTE — Progress Notes (Signed)
  Subjective:    Patient ID: UNNAMED ZEIEN, male    DOB: Feb 08, 1976, 37 y.o.   MRN: [id number]  HPI here for DOT exam Patient Active Problem List  Diagnosis  . DIABETES MELLITUS, TYPE II  . HYPERTENSION  . ASTHMA  . GERD  . HEADACHE  . LUMBAR STRAIN  . BMI 33.0-33.9,adult   Dr. Clent Ridges has recently discontinued blood pressure medicine and reduced diabetes medicines to only a low dose of metformin due to his dramatic weight loss from diet control He feels great    Review of Systems Review of systems otherwise negative    Objective:   Physical Exam Physical exam as noted on Department of Transportation form Repeat blood pressure 140/90       Assessment & Plan:  DOT exam-cleared for one year

## 2012-04-22 ENCOUNTER — Encounter: Payer: Self-pay | Admitting: Internal Medicine

## 2013-04-23 ENCOUNTER — Telehealth: Payer: Self-pay | Admitting: Family Medicine

## 2013-04-23 ENCOUNTER — Ambulatory Visit (INDEPENDENT_AMBULATORY_CARE_PROVIDER_SITE_OTHER): Payer: Self-pay | Admitting: Family Medicine

## 2013-04-23 ENCOUNTER — Encounter: Payer: Self-pay | Admitting: Family Medicine

## 2013-04-23 VITALS — BP 150/100 | Temp 98.3°F | Wt 221.0 lb

## 2013-04-23 DIAGNOSIS — I10 Essential (primary) hypertension: Secondary | ICD-10-CM

## 2013-04-23 DIAGNOSIS — E119 Type 2 diabetes mellitus without complications: Secondary | ICD-10-CM

## 2013-04-23 LAB — HEMOGLOBIN A1C: HEMOGLOBIN A1C: 13 % — AB (ref 4.6–6.5)

## 2013-04-23 LAB — CBC WITH DIFFERENTIAL/PLATELET
BASOS PCT: 0.3 % (ref 0.0–3.0)
Basophils Absolute: 0 10*3/uL (ref 0.0–0.1)
EOS ABS: 0.1 10*3/uL (ref 0.0–0.7)
EOS PCT: 0.6 % (ref 0.0–5.0)
HCT: 45.7 % (ref 39.0–52.0)
HEMOGLOBIN: 15.4 g/dL (ref 13.0–17.0)
LYMPHS PCT: 41.1 % (ref 12.0–46.0)
Lymphs Abs: 4.1 10*3/uL — ABNORMAL HIGH (ref 0.7–4.0)
MCHC: 33.8 g/dL (ref 30.0–36.0)
MCV: 90.6 fl (ref 78.0–100.0)
Monocytes Absolute: 0.6 10*3/uL (ref 0.1–1.0)
Monocytes Relative: 6.5 % (ref 3.0–12.0)
NEUTROS ABS: 5.2 10*3/uL (ref 1.4–7.7)
Neutrophils Relative %: 51.5 % (ref 43.0–77.0)
Platelets: 319 10*3/uL (ref 150.0–400.0)
RBC: 5.05 Mil/uL (ref 4.22–5.81)
RDW: 12.7 % (ref 11.5–14.6)
WBC: 10 10*3/uL (ref 4.5–10.5)

## 2013-04-23 LAB — BASIC METABOLIC PANEL
BUN: 11 mg/dL (ref 6–23)
CALCIUM: 9.6 mg/dL (ref 8.4–10.5)
CHLORIDE: 102 meq/L (ref 96–112)
CO2: 26 meq/L (ref 19–32)
Creatinine, Ser: 0.7 mg/dL (ref 0.4–1.5)
GFR: 154.11 mL/min (ref >60.00)
Glucose, Bld: 355 mg/dL — ABNORMAL HIGH (ref 70–99)
Potassium: 3.9 mEq/L (ref 3.5–5.1)
SODIUM: 135 meq/L (ref 135–145)

## 2013-04-23 LAB — POCT URINALYSIS DIPSTICK
Bilirubin, UA: NEGATIVE
Leukocytes, UA: NEGATIVE
NITRITE UA: NEGATIVE
PH UA: 5.5
RBC UA: NEGATIVE
SPEC GRAV UA: 1.015
UROBILINOGEN UA: 0.2

## 2013-04-23 LAB — LIPID PANEL
CHOL/HDL RATIO: 4
Cholesterol: 147 mg/dL (ref 0–200)
HDL: 36.7 mg/dL — ABNORMAL LOW (ref >39.00)
LDL CALC: 93 mg/dL (ref 0–99)
Triglycerides: 89 mg/dL (ref 0.0–149.0)
VLDL: 17.8 mg/dL (ref 0.0–40.0)

## 2013-04-23 LAB — HEPATIC FUNCTION PANEL
ALBUMIN: 4.3 g/dL (ref 3.5–5.2)
ALK PHOS: 75 U/L (ref 39–117)
ALT: 20 U/L (ref 0–53)
AST: 16 U/L (ref 0–37)
BILIRUBIN DIRECT: 0 mg/dL (ref 0.0–0.3)
TOTAL PROTEIN: 7.4 g/dL (ref 6.0–8.3)
Total Bilirubin: 0.7 mg/dL (ref 0.3–1.2)

## 2013-04-23 LAB — TSH: TSH: 1.02 u[IU]/mL (ref 0.35–5.50)

## 2013-04-23 MED ORDER — METFORMIN HCL 1000 MG PO TABS: 1000.0000 mg | ORAL_TABLET | Freq: Two times a day (BID) | ORAL | Status: DC

## 2013-04-23 MED ORDER — LISINOPRIL 20 MG PO TABS: 20.0000 mg | ORAL_TABLET | Freq: Every day | ORAL | Status: DC

## 2013-04-23 NOTE — Progress Notes (Signed)
Pre visit review using our clinic review tool, if applicable. No additional management support is needed unless otherwise documented below in the visit note. 

## 2013-04-23 NOTE — Telephone Encounter (Signed)
Relevant patient education assigned to patient using Emmi. ° °

## 2013-04-23 NOTE — Telephone Encounter (Signed)
Relevant patient education mailed to patient.  

## 2013-04-23 NOTE — Progress Notes (Signed)
   Subjective:    Patient ID: Danny Fuller, male    DOB: Apr 23, 1975, 38 y.o.   MRN: [id number]  HPI Here to follow up after an absence of 2 years. He feels well. He has been off all meds for 7 months and he knows his BP has been running high. He has not checked his glucoses for a long while.    Review of Systems  Constitutional: Negative.   Respiratory: Negative.   Cardiovascular: Negative.        Objective:   Physical Exam  Constitutional: He appears well-developed and well-nourished.  Cardiovascular: Normal rate, regular rhythm, normal heart sounds and intact distal pulses.   Pulmonary/Chest: Effort normal and breath sounds normal.          Assessment & Plan:  Get back on Lisinopril and Metformin. Get labs today.

## 2013-04-29 MED ORDER — GLIPIZIDE 10 MG PO TABS: 10.0000 mg | ORAL_TABLET | Freq: Two times a day (BID) | ORAL | Status: DC

## 2013-04-29 NOTE — Addendum Note (Signed)
Addended by: Aggie Hacker A on: 04/29/2013 01:49 PM   Modules accepted: Orders

## 2013-05-30 ENCOUNTER — Encounter: Payer: Self-pay | Admitting: Family Medicine

## 2013-05-30 ENCOUNTER — Ambulatory Visit (INDEPENDENT_AMBULATORY_CARE_PROVIDER_SITE_OTHER): Payer: Self-pay | Admitting: Family Medicine

## 2013-05-30 VITALS — BP 158/90 | HR 87 | Temp 98.2°F | Ht 69.5 in | Wt 234.0 lb

## 2013-05-30 DIAGNOSIS — I10 Essential (primary) hypertension: Secondary | ICD-10-CM

## 2013-05-30 DIAGNOSIS — E119 Type 2 diabetes mellitus without complications: Secondary | ICD-10-CM

## 2013-05-30 MED ORDER — CYCLOBENZAPRINE HCL 10 MG PO TABS: 10.0000 mg | ORAL_TABLET | Freq: Three times a day (TID) | ORAL | Status: DC | PRN

## 2013-05-30 NOTE — Progress Notes (Signed)
   Subjective:    Patient ID: Danny Fuller, male    DOB: 1975/05/27, 38 y.o.   MRN: [id number]  HPI He has been back on meds for the past month and he feels much better. His BP at home is normal and his glucoses run from 90 to 120 most of this time. He is quitting smoking as well.    Review of Systems  Constitutional: Negative.   Respiratory: Negative.   Cardiovascular: Negative.        Objective:   Physical Exam  Constitutional: He appears well-developed and well-nourished.  Cardiovascular: Normal rate, regular rhythm, normal heart sounds and intact distal pulses.   Pulmonary/Chest: Effort normal and breath sounds normal.          Assessment & Plan:  Recheck in 2 months

## 2013-05-30 NOTE — Progress Notes (Signed)
Pre visit review using our clinic review tool, if applicable. No additional management support is needed unless otherwise documented below in the visit note. 

## 2013-06-11 ENCOUNTER — Telehealth: Payer: Self-pay

## 2013-06-11 ENCOUNTER — Telehealth: Payer: Self-pay | Admitting: Family Medicine

## 2013-06-11 NOTE — Telephone Encounter (Signed)
Relevant patient education mailed to patient.  

## 2013-06-11 NOTE — Telephone Encounter (Signed)
I left a voice message for pt to return my call.  

## 2013-06-11 NOTE — Telephone Encounter (Signed)
Pleas get more info. Who is this note going to? And what is it for?

## 2013-06-11 NOTE — Telephone Encounter (Signed)
Pt need a letter stating that his blood pressure  and diabetes is controlled by medication

## 2013-06-12 NOTE — Telephone Encounter (Signed)
I spoke with pt and he needs the letter for [facility], this is where pt is getting his check up for his DOT card, please call pt when ready for pick up.

## 2013-06-13 NOTE — Telephone Encounter (Signed)
Note is ready for pick up and I left a voice message for pt.

## 2013-06-13 NOTE — Telephone Encounter (Signed)
The note is ready to be picked up

## 2013-07-15 ENCOUNTER — Ambulatory Visit (INDEPENDENT_AMBULATORY_CARE_PROVIDER_SITE_OTHER): Payer: 59 | Admitting: Family Medicine

## 2013-07-15 ENCOUNTER — Encounter: Payer: Self-pay | Admitting: Family Medicine

## 2013-07-15 VITALS — BP 153/121 | HR 92 | Temp 98.2°F | Ht 69.5 in | Wt 232.0 lb

## 2013-07-15 DIAGNOSIS — E119 Type 2 diabetes mellitus without complications: Secondary | ICD-10-CM

## 2013-07-15 DIAGNOSIS — I10 Essential (primary) hypertension: Secondary | ICD-10-CM

## 2013-07-15 LAB — HEMOGLOBIN A1C: HEMOGLOBIN A1C: 9.2 % — AB (ref 4.6–6.5)

## 2013-07-15 MED ORDER — AMLODIPINE BESYLATE 5 MG PO TABS: 5.0000 mg | ORAL_TABLET | Freq: Every day | ORAL | Status: DC

## 2013-07-15 NOTE — Progress Notes (Signed)
Pre visit review using our clinic review tool, if applicable. No additional management support is needed unless otherwise documented below in the visit note. 

## 2013-07-15 NOTE — Progress Notes (Signed)
   Subjective:    Patient ID: Danny Fuller, male    DOB: 12-14-1975, 38 y.o.   MRN: [id number]  HPI Here to follow up. He feels great and has quit smoking. He did not pass his recent DOT exam however. They said he needs to submit another A1c result and he needs to get the BP down.    Review of Systems  Constitutional: Negative.   Respiratory: Negative.   Cardiovascular: Negative.        Objective:   Physical Exam  Constitutional: He appears well-developed and well-nourished.  Cardiovascular: Normal rate, regular rhythm, normal heart sounds and intact distal pulses.   Pulmonary/Chest: Effort normal and breath sounds normal.          Assessment & Plan:  Get an A1c today. Add Amlodipine 5 mg daily.

## 2013-07-21 MED ORDER — SITAGLIPTIN PHOSPHATE 100 MG PO TABS: 100.0000 mg | ORAL_TABLET | Freq: Every day | ORAL | Status: DC

## 2013-07-21 NOTE — Addendum Note (Signed)
Addended by: Aggie Hacker A on: 07/21/2013 12:19 PM   Modules accepted: Orders

## 2013-07-22 ENCOUNTER — Telehealth: Payer: Self-pay | Admitting: Family Medicine

## 2013-07-22 NOTE — Telephone Encounter (Signed)
[employer] [facility] denied Januvia.  Pt must have a history of a 3 month trial of, contraindication to, or intolerance to all of the alternatives covered by their plan:  Onglyza, Tradjenta, Nesina.

## 2013-07-22 NOTE — Telephone Encounter (Signed)
Okay. Stop Januvia and instead send in Onglyza 5 mg daily, #90 with 3 rf

## 2013-07-23 NOTE — Telephone Encounter (Signed)
I left a voice message for pt to return my call. Does pt want this to go to a mail order or local pharmacy?

## 2013-07-25 MED ORDER — SAXAGLIPTIN HCL 5 MG PO TABS: 5.0000 mg | ORAL_TABLET | Freq: Every day | ORAL | Status: DC

## 2013-07-25 NOTE — Telephone Encounter (Signed)
I called pt and left another voice message with the below information.

## 2013-07-25 NOTE — Telephone Encounter (Signed)
I spoke with pt, cancelled the [place] and sent script for Onglyza e-scribe.

## 2013-07-30 ENCOUNTER — Ambulatory Visit: Payer: Self-pay | Admitting: Family Medicine

## 2013-09-08 ENCOUNTER — Other Ambulatory Visit: Payer: Self-pay | Admitting: Family Medicine

## 2013-09-08 DIAGNOSIS — E119 Type 2 diabetes mellitus without complications: Secondary | ICD-10-CM

## 2013-10-14 ENCOUNTER — Ambulatory Visit: Payer: 59 | Admitting: Family Medicine

## 2014-05-12 ENCOUNTER — Encounter (HOSPITAL_COMMUNITY): Payer: Self-pay | Admitting: Emergency Medicine

## 2014-05-12 ENCOUNTER — Emergency Department (HOSPITAL_COMMUNITY): Payer: Self-pay

## 2014-05-12 ENCOUNTER — Emergency Department (HOSPITAL_COMMUNITY)
Admission: EM | Admit: 2014-05-12 | Discharge: 2014-05-12 | Disposition: A | Discharge status: Home / Self Care | Payer: Self-pay | Attending: Emergency Medicine | Admitting: Emergency Medicine

## 2014-05-12 DIAGNOSIS — X58XXXA Exposure to other specified factors, initial encounter: Secondary | ICD-10-CM | POA: Insufficient documentation

## 2014-05-12 DIAGNOSIS — Y998 Other external cause status: Secondary | ICD-10-CM | POA: Insufficient documentation

## 2014-05-12 DIAGNOSIS — I10 Essential (primary) hypertension: Secondary | ICD-10-CM | POA: Insufficient documentation

## 2014-05-12 DIAGNOSIS — Z79899 Other long term (current) drug therapy: Secondary | ICD-10-CM | POA: Insufficient documentation

## 2014-05-12 DIAGNOSIS — Y9289 Other specified places as the place of occurrence of the external cause: Secondary | ICD-10-CM | POA: Insufficient documentation

## 2014-05-12 DIAGNOSIS — Z87891 Personal history of nicotine dependence: Secondary | ICD-10-CM | POA: Insufficient documentation

## 2014-05-12 DIAGNOSIS — S4991XA Unspecified injury of right shoulder and upper arm, initial encounter: Secondary | ICD-10-CM | POA: Insufficient documentation

## 2014-05-12 DIAGNOSIS — Y9389 Activity, other specified: Secondary | ICD-10-CM | POA: Insufficient documentation

## 2014-05-12 DIAGNOSIS — Z8719 Personal history of other diseases of the digestive system: Secondary | ICD-10-CM | POA: Insufficient documentation

## 2014-05-12 DIAGNOSIS — J45909 Unspecified asthma, uncomplicated: Secondary | ICD-10-CM | POA: Insufficient documentation

## 2014-05-12 DIAGNOSIS — M25511 Pain in right shoulder: Secondary | ICD-10-CM

## 2014-05-12 DIAGNOSIS — E119 Type 2 diabetes mellitus without complications: Secondary | ICD-10-CM | POA: Insufficient documentation

## 2014-05-12 MED ORDER — OXYCODONE-ACETAMINOPHEN 5-325 MG PO TABS
1.0000 | ORAL_TABLET | Freq: Once | ORAL | Status: AC
  Administered 2014-05-12: 1 via ORAL
  Filled 2014-05-12: qty 1

## 2014-05-12 MED ORDER — OXYCODONE-ACETAMINOPHEN 5-325 MG PO TABS: 1.0000 | ORAL_TABLET | ORAL | Status: DC | PRN

## 2014-05-12 MED ORDER — NAPROXEN 250 MG PO TABS
500.0000 mg | ORAL_TABLET | Freq: Once | ORAL | Status: AC
  Administered 2014-05-12: 500 mg via ORAL
  Filled 2014-05-12: qty 2

## 2014-05-12 MED ORDER — NAPROXEN 500 MG PO TABS: 500.0000 mg | ORAL_TABLET | Freq: Two times a day (BID) | ORAL | Status: DC

## 2014-05-12 NOTE — ED Notes (Signed)
Pt c/o R shoulder pain x 1.5 weeks. 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 Pain increases in axillae on movement, described as sharp pains. Pt hs tried ibuprofen and muscle relaxers at home without relief

## 2014-05-12 NOTE — Discharge Instructions (Signed)
Please follow the directions provided. Be sure to follow-up with orthopedic doctor for further management of the shoulder pain. Please use the naproxen twice a day to help with inflammation. You may use the Percocet as needed for pain not relieved by the naproxen. Where your sling for comfort be sure to continue to try to move your arm throughout the day to maintain mobility. Don't hesitate to return for any new, worsening, or concerning symptoms.   SEEK MEDICAL CARE IF:  Your pain and swelling increase.  You develop new, unexplained symptoms, especially increased numbness in the hands.

## 2014-05-12 NOTE — ED Notes (Signed)
Pt c/o right shoulder pain x 1 week; pt unsure of injury

## 2014-05-12 NOTE — ED Provider Notes (Signed)
CSN: [id number]     Arrival date & time 05/12/14  1751 History  This chart was scribed for Britt Bottom, NP, working with Orlie Dakin, MD by Starleen Arms, ED Scribe. This patient was seen in room TR06C/TR06C and the patient's care was started at 6:40 PM.   Chief Complaint  Patient presents with  . Shoulder Pain   The history is provided by the patient. No language interpreter was used.   HPI Comments: Danny Fuller is a 39 y.o. male who presents to the Emergency Department complaining of worsening right shoulder pain onset 1 week ago.  He describes the pain is aching and states that within the past 2 days he has developed the inability to move abduct his arm above horizontal without sharp pain when he performs the motion quickly.   He has taken ibuprofen without relief.   Patient denies previous similar episode.  Patient denies injury.  Patient denies fever, chills, n/v, CP, SOB.   Past Medical History  Diagnosis Date  . Diabetes mellitus   . Hypertension   . Asthma   . GERD (gastroesophageal reflux disease)   . GMWNUUVO(536.6)    Past Surgical History  Procedure Laterality Date  . Bilateral hip pins placed     Family History  Problem Relation Age of Onset  . Arthritis    . Diabetes    . Hypertension    . Hyperlipidemia    . Stroke    . Sudden death     History  Substance Use Topics  . Smoking status: Former Smoker    Types: Cigarettes  . Smokeless tobacco: Never Used  . Alcohol Use: Yes     Comment: once or twice a month    Review of Systems  Constitutional: Negative for fever and chills.  Respiratory: Negative for shortness of breath.   Cardiovascular: Negative for chest pain.  Gastrointestinal: Negative for nausea and vomiting.  Musculoskeletal: Positive for arthralgias.      Allergies  Review of patient's allergies indicates no known allergies.  Home Medications   Prior to Admission medications   Medication Sig Start Date End Date Taking?  Authorizing Provider  amLODipine (NORVASC) 5 MG tablet Take 1 tablet (5 mg total) by mouth daily. 07/15/13   [name], MD  cyclobenzaprine (FLEXERIL) 10 MG tablet Take 1 tablet (10 mg total) by mouth 3 (three) times daily as needed for muscle spasms. 05/30/13   [name], MD  glipiZIDE (GLUCOTROL) 10 MG tablet Take 1 tablet (10 mg total) by mouth 2 (two) times daily before a meal. 04/29/13   [name], MD  ibuprofen (ADVIL,MOTRIN) 200 MG tablet Take 600 mg by mouth every 6 (six) hours as needed. As needed for pain.    Historical Provider, MD  lisinopril (PRINIVIL,ZESTRIL) 20 MG tablet Take 1 tablet (20 mg total) by mouth daily. 04/23/13   [name], MD  metFORMIN (GLUCOPHAGE) 1000 MG tablet Take 1 tablet (1,000 mg total) by mouth 2 (two) times daily with a meal. 04/23/13   [name], MD  saxagliptin HCl (ONGLYZA) 5 MG TABS tablet Take 1 tablet (5 mg total) by mouth daily. 07/25/13   [name], MD   BP 147/83 mmHg  Pulse 99  Temp(Src) 98.1 F (36.7 C) (Oral)  Resp 18  SpO2 97% Physical Exam  Constitutional: He is oriented to person, place, and time. He appears well-developed and well-nourished. No distress.  HENT:  Head: Normocephalic and atraumatic.  Eyes:  Conjunctivae and EOM are normal. Right eye exhibits no discharge. Left eye exhibits no discharge. No scleral icterus.  Neck: Neck supple. No tracheal deviation present.  Cardiovascular: Normal rate and intact distal pulses.   Pulmonary/Chest: Effort normal. No respiratory distress.  Musculoskeletal: He exhibits tenderness.       Right shoulder: He exhibits decreased range of motion and tenderness. He exhibits no bony tenderness, no swelling, no deformity and normal strength.  Right shoulder ROM limited to 45 degrees with adduction.  5/5 strength with flexion and extension  Neurological: He is alert and oriented to person, place, and time. Coordination normal.  Skin: Skin is warm and dry. He is not diaphoretic.   Psychiatric: He has a normal mood and affect. His behavior is normal.  Nursing note and vitals reviewed.   ED Course  Procedures (including critical care time)  DIAGNOSTIC STUDIES: Oxygen Saturation is 97% on RA, normal by my interpretation.    COORDINATION OF CARE:  6:48 PM Discussed treatment plan with patient at bedside.  Patient acknowledges and agrees with plan.    Labs Review Labs Reviewed - No data to display  Imaging Review Dg Shoulder Right  05/12/2014   CLINICAL DATA:  39 year old male with right-sided shoulder pain for the past week, with inability to abduct his arm above horizontal past 2 days.  EXAM: RIGHT SHOULDER - 2+ VIEW  COMPARISON:  No priors.  FINDINGS: Multiple views of the right shoulder demonstrate no acute displaced fracture, subluxation, dislocation, or soft tissue abnormality.  IMPRESSION: No acute radiographic abnormality of the right shoulder.   Electronically Signed   By: Vinnie Langton M.D.   On: 05/12/2014 19:42     EKG Interpretation None      MDM   Final diagnoses:  Right shoulder pain   39 yo with shoulder pain and stiffness with possible overuse injury. His x-ray is negative for obvious fracture or dislocation. His pain and mobility improved after meds. He was given a sling for comfort.  Pt advised to follow up with orthopedics if symptoms persist. Conservative therapy recommended and discussed. Pt is well-appearing, in no acute distress and vital signs reviewed and not concerning. He appears safe to be discharged.  Return precautions provided. Patient will be dc home & is agreeable with above plan.   I personally performed the services described in this documentation, which was scribed in my presence. The recorded information has been reviewed and is accurate.  Filed Vitals:   05/12/14 1806 05/12/14 2007  BP: 147/83 128/80  Pulse: 99 74  Temp: 98.1 F (36.7 C) 97.5 F (36.4 C)  TempSrc: Oral Oral  Resp: 18 22  SpO2: 97% 99%    Meds given in ED:  Medications  oxyCODONE-acetaminophen (PERCOCET/ROXICET) 5-325 MG per tablet 1 tablet (1 tablet Oral Given 05/12/14 1856)  naproxen (NAPROSYN) tablet 500 mg (500 mg Oral Given 05/12/14 1856)    Discharge Medication List as of 05/12/2014  7:52 PM    START taking these medications   Details  naproxen (NAPROSYN) 500 MG tablet Take 1 tablet (500 mg total) by mouth 2 (two) times daily., Starting 05/12/2014, Until Discontinued, Print    oxyCODONE-acetaminophen (PERCOCET/ROXICET) 5-325 MG per tablet Take 1 tablet by mouth every 4 (four) hours as needed for severe pain., Starting 05/12/2014, Until Discontinued, Print         Britt Bottom, NP 05/14/14 [place], MD 05/15/14 0111

## 2014-10-18 ENCOUNTER — Emergency Department (HOSPITAL_COMMUNITY)
Admission: EM | Admit: 2014-10-18 | Discharge: 2014-10-18 | Disposition: A | Discharge status: Home / Self Care | Payer: Self-pay | Attending: Emergency Medicine | Admitting: Emergency Medicine

## 2014-10-18 ENCOUNTER — Encounter (HOSPITAL_COMMUNITY): Payer: Self-pay

## 2014-10-18 ENCOUNTER — Emergency Department (HOSPITAL_COMMUNITY): Payer: Self-pay

## 2014-10-18 DIAGNOSIS — J45909 Unspecified asthma, uncomplicated: Secondary | ICD-10-CM | POA: Insufficient documentation

## 2014-10-18 DIAGNOSIS — W228XXA Striking against or struck by other objects, initial encounter: Secondary | ICD-10-CM | POA: Insufficient documentation

## 2014-10-18 DIAGNOSIS — S86811A Strain of other muscle(s) and tendon(s) at lower leg level, right leg, initial encounter: Secondary | ICD-10-CM | POA: Insufficient documentation

## 2014-10-18 DIAGNOSIS — S199XXA Unspecified injury of neck, initial encounter: Secondary | ICD-10-CM | POA: Insufficient documentation

## 2014-10-18 DIAGNOSIS — M25561 Pain in right knee: Secondary | ICD-10-CM

## 2014-10-18 DIAGNOSIS — Y998 Other external cause status: Secondary | ICD-10-CM | POA: Insufficient documentation

## 2014-10-18 DIAGNOSIS — Z79899 Other long term (current) drug therapy: Secondary | ICD-10-CM | POA: Insufficient documentation

## 2014-10-18 DIAGNOSIS — Z8739 Personal history of other diseases of the musculoskeletal system and connective tissue: Secondary | ICD-10-CM | POA: Insufficient documentation

## 2014-10-18 DIAGNOSIS — Z720 Tobacco use: Secondary | ICD-10-CM | POA: Insufficient documentation

## 2014-10-18 DIAGNOSIS — K029 Dental caries, unspecified: Secondary | ICD-10-CM | POA: Insufficient documentation

## 2014-10-18 DIAGNOSIS — I10 Essential (primary) hypertension: Secondary | ICD-10-CM | POA: Insufficient documentation

## 2014-10-18 DIAGNOSIS — K047 Periapical abscess without sinus: Secondary | ICD-10-CM | POA: Insufficient documentation

## 2014-10-18 DIAGNOSIS — E119 Type 2 diabetes mellitus without complications: Secondary | ICD-10-CM | POA: Insufficient documentation

## 2014-10-18 DIAGNOSIS — S8391XA Sprain of unspecified site of right knee, initial encounter: Secondary | ICD-10-CM | POA: Insufficient documentation

## 2014-10-18 DIAGNOSIS — Y9389 Activity, other specified: Secondary | ICD-10-CM | POA: Insufficient documentation

## 2014-10-18 DIAGNOSIS — Y9289 Other specified places as the place of occurrence of the external cause: Secondary | ICD-10-CM | POA: Insufficient documentation

## 2014-10-18 MED ORDER — NAPROXEN 500 MG PO TABS: 500.0000 mg | ORAL_TABLET | Freq: Two times a day (BID) | ORAL | Status: DC | PRN

## 2014-10-18 MED ORDER — HYDROCODONE-ACETAMINOPHEN 5-325 MG PO TABS: 1.0000 | ORAL_TABLET | Freq: Four times a day (QID) | ORAL | Status: DC | PRN

## 2014-10-18 MED ORDER — DOXYCYCLINE HYCLATE 100 MG PO CAPS: 100.0000 mg | ORAL_CAPSULE | Freq: Two times a day (BID) | ORAL | Status: DC

## 2014-10-18 MED ORDER — HYDROCODONE-ACETAMINOPHEN 5-325 MG PO TABS
1.0000 | ORAL_TABLET | Freq: Once | ORAL | Status: AC
  Administered 2014-10-18: 1 via ORAL
  Filled 2014-10-18: qty 1

## 2014-10-18 NOTE — Discharge Instructions (Signed)
Apply warm compresses to jaw throughout the day. Take antibiotic until finished. Take naprosyn and norco as directed, as needed for pain but do not drive or operate machinery with pain medication use. Followup with a dentist is very important for ongoing evaluation and management of recurrent dental pain. Call the dentist listed above, or use the list below to find one. STOP SMOKING!  Wear knee sleeve and use crutches as needed for comfort. Ice and elevate knee throughout the day. Call orthopedic follow up today or tomorrow to schedule followup appointment for recheck of ongoing knee pain in one to two weeks that can be canceled with a 24-48 hour notice if complete resolution of pain. Return to emergency department for emergent changing or worsening symptoms.   Dental Caries Dental caries is tooth decay. This decay can cause a hole in teeth (cavity) that can get bigger and deeper over time. HOME CARE  Brush and floss your teeth. Do this at least two times a day.  Use a fluoride toothpaste.  Use a mouth rinse if told by your dentist or doctor.  Eat less sugary and starchy foods. Drink less sugary drinks.  Avoid snacking often on sugary and starchy foods. Avoid sipping often on sugary drinks.  Keep regular checkups and cleanings with your dentist.  Use fluoride supplements if told by your dentist or doctor.  Allow fluoride to be applied to teeth if told by your dentist or doctor. Document Released: 11/09/2007 Document Revised: 06/16/2013 Document Reviewed: 02/02/2012 [facility] Patient Information 2015 [place], [place]. This information is not intended to replace advice given to you by your health care provider. Make sure you discuss any questions you have with your health care provider.  Dental Abscess A dental abscess is a collection of infected fluid (pus) from a bacterial infection in the inner part of the tooth (pulp). It usually occurs at the end of the tooth's root.  CAUSES   Severe  tooth decay.  Trauma to the tooth that allows bacteria to enter into the pulp, such as a broken or chipped tooth. SYMPTOMS   Severe pain in and around the infected tooth.  Swelling and redness around the abscessed tooth or in the mouth or face.  Tenderness.  Pus drainage.  Bad breath.  Bitter taste in the mouth.  Difficulty swallowing.  Difficulty opening the mouth.  Nausea.  Vomiting.  Chills.  Swollen neck glands. DIAGNOSIS   A medical and dental history will be taken.  An examination will be performed by tapping on the abscessed tooth.  X-rays may be taken of the tooth to identify the abscess. TREATMENT The goal of treatment is to eliminate the infection. You may be prescribed antibiotic medicine to stop the infection from spreading. A root canal may be performed to save the tooth. If the tooth cannot be saved, it may be pulled (extracted) and the abscess may be drained.  HOME CARE INSTRUCTIONS  Only take over-the-counter or prescription medicines for pain, fever, or discomfort as directed by your caregiver.  Rinse your mouth (gargle) often with salt water ( tsp salt in 8 oz [250 ml] of warm water) to relieve pain or swelling.  Do not drive after taking pain medicine (narcotics).  Do not apply heat to the outside of your face.  Return to your dentist for further treatment as directed. SEEK MEDICAL CARE IF:  Your pain is not helped by medicine.  Your pain is getting worse instead of better. SEEK IMMEDIATE MEDICAL CARE IF:  You  have a fever or persistent symptoms for more than 2-3 days.  You have a fever and your symptoms suddenly get worse.  You have chills or a very bad headache.  You have problems breathing or swallowing.  You have trouble opening your mouth.  You have swelling in the neck or around the eye. Document Released: 01/30/2005 Document Revised: 10/25/2011 Document Reviewed: 05/10/2010 [facility] Patient Information 2015 [place], [place].  This information is not intended to replace advice given to you by your health care provider. Make sure you discuss any questions you have with your health care provider.  Knee Sprain A knee sprain is a tear in the strong bands of tissue that connect the bones (ligaments) of your knee. HOME CARE  Raise (elevate) your injured knee to lessen puffiness (swelling).  To ease pain and puffiness, put ice on the injured area.  Put ice in a plastic bag.  Place a towel between your skin and the bag.  Leave the ice on for 20 minutes, 2-3 times a day.  Only take medicine as told by your doctor.  Do not leave your knee unprotected until pain and stiffness go away (usually 4-6 weeks).  If you have a cast or splint, do not get it wet. If your doctor told you to not take it off, cover it with a plastic bag when you shower or bathe. Do not swim.  Your doctor may have you do exercises to prevent or limit permanent weakness and stiffness. GET HELP RIGHT AWAY IF:   Your cast or splint becomes damaged.  Your pain gets worse.  You have a lot of pain, puffiness, or numbness below the cast or splint. MAKE SURE YOU:   Understand these instructions.  Will watch your condition.  Will get help right away if you are not doing well or get worse. Document Released: 01/18/2009 Document Revised: 02/04/2013 Document Reviewed: 10/08/2012 [facility] Patient Information 2015 [place], [place]. This information is not intended to replace advice given to you by your health care provider. Make sure you discuss any questions you have with your health care provider.  Smoking Cessation Quitting smoking is important to your health and has many advantages. However, it is not always easy to quit since nicotine is a very addictive drug. Oftentimes, people try 3 times or more before being able to quit. This document explains the best ways for you to prepare to quit smoking. Quitting takes hard work and a lot of effort, but you  can do it. ADVANTAGES OF QUITTING SMOKING  You will live longer, feel better, and live better.  Your body will feel the impact of quitting smoking almost immediately.  Within 20 minutes, blood pressure decreases. Your pulse returns to its normal level.  After 8 hours, carbon monoxide levels in the blood return to normal. Your oxygen level increases.  After 24 hours, the chance of having a heart attack starts to decrease. Your breath, hair, and body stop smelling like smoke.  After 48 hours, damaged nerve endings begin to recover. Your sense of taste and smell improve.  After 72 hours, the body is virtually free of nicotine. Your bronchial tubes relax and breathing becomes easier.  After 2 to 12 weeks, lungs can hold more air. Exercise becomes easier and circulation improves.  The risk of having a heart attack, stroke, cancer, or lung disease is greatly reduced.  After 1 year, the risk of coronary heart disease is cut in half.  After 5 years, the risk of stroke  falls to the same as a nonsmoker.  After 10 years, the risk of lung cancer is cut in half and the risk of other cancers decreases significantly.  After 15 years, the risk of coronary heart disease drops, usually to the level of a nonsmoker.  If you are pregnant, quitting smoking will improve your chances of having a healthy baby.  The people you live with, especially any children, will be healthier.  You will have extra money to spend on things other than cigarettes. QUESTIONS TO THINK ABOUT BEFORE ATTEMPTING TO QUIT You may want to talk about your answers with your health care provider.  Why do you want to quit?  If you tried to quit in the past, what helped and what did not?  What will be the most difficult situations for you after you quit? How will you plan to handle them?  Who can help you through the tough times? Your family? Friends? A health care provider?  What pleasures do you get from smoking? What ways  can you still get pleasure if you quit? Here are some questions to ask your health care provider:  How can you help me to be successful at quitting?  What medicine do you think would be best for me and how should I take it?  What should I do if I need more help?  What is smoking withdrawal like? How can I get information on withdrawal? GET READY  Set a quit date.  Change your environment by getting rid of all cigarettes, ashtrays, matches, and lighters in your home, car, or work. Do not let people smoke in your home.  Review your past attempts to quit. Think about what worked and what did not. GET SUPPORT AND ENCOURAGEMENT You have a better chance of being successful if you have help. You can get support in many ways.  Tell your family, friends, and coworkers that you are going to quit and need their support. Ask them not to smoke around you.  Get individual, group, or telephone counseling and support. Programs are available at [employer] and health centers. Call your local health department for information about programs in your area.  Spiritual beliefs and practices may help some smokers quit.  Download a "quit meter" on your computer to keep track of quit statistics, such as how long you have gone without smoking, cigarettes not smoked, and money saved.  Get a self-help book about quitting smoking and staying off tobacco. [place] yourself from urges to smoke. Talk to someone, go for a walk, or occupy your time with a task.  Change your normal routine. Take a different route to work. Drink tea instead of coffee. Eat breakfast in a different place.  Reduce your stress. Take a hot bath, exercise, or read a book.  Plan something enjoyable to do every day. Reward yourself for not smoking.  Explore interactive web-based programs that specialize in helping you quit. GET MEDICINE AND USE IT CORRECTLY Medicines can help you stop smoking and  decrease the urge to smoke. Combining medicine with the above behavioral methods and support can greatly increase your chances of successfully quitting smoking.  Nicotine replacement therapy helps deliver nicotine to your body without the negative effects and risks of smoking. Nicotine replacement therapy includes nicotine gum, lozenges, inhalers, nasal sprays, and skin patches. Some may be available over-the-counter and others require a prescription.  Antidepressant medicine helps people abstain from smoking, but how this works is  unknown. This medicine is available by prescription.  Nicotinic receptor partial agonist medicine simulates the effect of nicotine in your brain. This medicine is available by prescription. Ask your health care provider for advice about which medicines to use and how to use them based on your health history. Your health care provider will tell you what side effects to look out for if you choose to be on a medicine or therapy. Carefully read the information on the package. Do not use any other product containing nicotine while using a nicotine replacement product.  RELAPSE OR DIFFICULT SITUATIONS Most relapses occur within the first 3 months after quitting. Do not be discouraged if you start smoking again. Remember, most people try several times before finally quitting. You may have symptoms of withdrawal because your body is used to nicotine. You may crave cigarettes, be irritable, feel very hungry, cough often, get headaches, or have difficulty concentrating. The withdrawal symptoms are only temporary. They are strongest when you first quit, but they will go away within 10-14 days. To reduce the chances of relapse, try to:  Avoid drinking alcohol. Drinking lowers your chances of successfully quitting.  Reduce the amount of caffeine you consume. Once you quit smoking, the amount of caffeine in your body increases and can give you symptoms, such as a rapid heartbeat,  sweating, and anxiety.  Avoid smokers because they can make you want to smoke.  Do not let weight gain distract you. Many smokers will gain weight when they quit, usually less than 10 pounds. Eat a healthy diet and stay active. You can always lose the weight gained after you quit.  Find ways to improve your mood other than smoking. FOR MORE INFORMATION  www.smokefree.gov  Document Released: 01/24/2001 Document Revised: 06/16/2013 Document Reviewed: 05/11/2011 [facility] Patient Information 2015 [place], [place]. This information is not intended to replace advice given to you by your health care provider. Make sure you discuss any questions you have with your health care provider.  Smoking Cessation, Tips for Success If you are ready to quit smoking, congratulations! You have chosen to help yourself be healthier. Cigarettes bring nicotine, tar, carbon monoxide, and other irritants into your body. Your lungs, heart, and blood vessels will be able to work better without these poisons. There are many different ways to quit smoking. Nicotine gum, nicotine patches, a nicotine inhaler, or nicotine nasal spray can help with physical craving. Hypnosis, support groups, and medicines help break the habit of smoking. WHAT THINGS CAN I DO TO MAKE QUITTING EASIER?  Here are some tips to help you quit for good:  Pick a date when you will quit smoking completely. Tell all of your friends and family about your plan to quit on that date.  Do not try to slowly cut down on the number of cigarettes you are smoking. Pick a quit date and quit smoking completely starting on that day.  Throw away all cigarettes.   Clean and remove all ashtrays from your home, work, and car.  On a card, write down your reasons for quitting. Carry the card with you and read it when you get the urge to smoke.  Cleanse your body of nicotine. Drink enough water and fluids to keep your urine clear or pale yellow. Do this after quitting to  flush the nicotine from your body.  Learn to predict your moods. Do not let a bad situation be your excuse to have a cigarette. Some situations in your life might tempt you into wanting  a cigarette.  Never have "just one" cigarette. It leads to wanting another and another. Remind yourself of your decision to quit.  Change habits associated with smoking. If you smoked while driving or when feeling stressed, try other activities to replace smoking. Stand up when drinking your coffee. Brush your teeth after eating. Sit in a different chair when you read the paper. Avoid alcohol while trying to quit, and try to drink fewer caffeinated beverages. Alcohol and caffeine may urge you to smoke.  Avoid foods and drinks that can trigger a desire to smoke, such as sugary or spicy foods and alcohol.  Ask people who smoke not to smoke around you.  Have something planned to do right after eating or having a cup of coffee. For example, plan to take a walk or exercise.  Try a relaxation exercise to calm you down and decrease your stress. Remember, you may be tense and nervous for the first 2 weeks after you quit, but this will pass.  Find new activities to keep your hands busy. Play with a pen, coin, or rubber band. Doodle or draw things on paper.  Brush your teeth right after eating. This will help cut down on the craving for the taste of tobacco after meals. You can also try mouthwash.   Use oral substitutes in place of cigarettes. Try using lemon drops, carrots, cinnamon sticks, or chewing gum. Keep them handy so they are available when you have the urge to smoke.  When you have the urge to smoke, try deep breathing.  Designate your home as a nonsmoking area.  If you are a heavy smoker, ask your health care provider about a prescription for nicotine chewing gum. It can ease your withdrawal from nicotine.  Reward yourself. Set aside the cigarette money you save and buy yourself something nice.  Look  for support from others. Join a support group or smoking cessation program. Ask someone at home or at work to help you with your plan to quit smoking.  Always ask yourself, "Do I need this cigarette or is this just a reflex?" Tell yourself, "Today, I choose not to smoke," or "I do not want to smoke." You are reminding yourself of your decision to quit.  Do not replace cigarette smoking with electronic cigarettes (commonly called e-cigarettes). The safety of e-cigarettes is unknown, and some may contain harmful chemicals.  If you relapse, do not give up! Plan ahead and think about what you will do the next time you get the urge to smoke. HOW WILL I FEEL WHEN I QUIT SMOKING? You may have symptoms of withdrawal because your body is used to nicotine (the addictive substance in cigarettes). You may crave cigarettes, be irritable, feel very hungry, cough often, get headaches, or have difficulty concentrating. The withdrawal symptoms are only temporary. They are strongest when you first quit but will go away within 10-14 days. When withdrawal symptoms occur, stay in control. Think about your reasons for quitting. Remind yourself that these are signs that your body is healing and getting used to being without cigarettes. Remember that withdrawal symptoms are easier to treat than the major diseases that smoking can cause.  Even after the withdrawal is over, expect periodic urges to smoke. However, these cravings are generally short lived and will go away whether you smoke or not. Do not smoke! WHAT RESOURCES ARE AVAILABLE TO HELP ME QUIT SMOKING? Your health care provider can direct you to community resources or hospitals for support, which may  include:  Group support.  Education.  Hypnosis.  Therapy. Document Released: 10/29/2003 Document Revised: 06/16/2013 Document Reviewed: 07/18/2012 [facility] Patient Information 2015 [place], [place]. This information is not intended to replace advice given to you by  your health care provider. Make sure you discuss any questions you have with your health care provider.    Emergency Department Resource Guide 1) Find a Doctor and Pay Out of Pocket Although you won't have to find out who is covered by your insurance plan, it is a good idea to ask around and get recommendations. You will then need to call the office and see if the doctor you have chosen will accept you as a new patient and what types of options they offer for patients who are self-pay. Some doctors offer discounts or will set up payment plans for their patients who do not have insurance, but you will need to ask so you aren't surprised when you get to your appointment.  2) Contact Your Local Health Department Not all health departments have doctors that can see patients for sick visits, but many do, so it is worth a call to see if yours does. If you don't know where your local health department is, you can check in your phone book. The CDC also has a tool to help you locate your state's health department, and many state websites also have listings of all of their local health departments.  3) Find a [facility] If your illness is not likely to be very severe or complicated, you may want to try a walk in clinic. These are popping up all over the country in pharmacies, drugstores, and shopping centers. They're usually staffed by nurse practitioners or physician assistants that have been trained to treat common illnesses and complaints. They're usually fairly quick and inexpensive. However, if you have serious medical issues or chronic medical problems, these are probably not your best option.  No Primary Care Doctor: - Call [facility] Connect at  [phone] - they can help you locate a primary care doctor that  accepts your insurance, provides certain services, etc. - Physician Referral Service- [phone]  Chronic Pain Problems: Organization         Address  Phone   Notes  [facility]  [phone] Patients need to be referred by their primary care doctor.   Medication Assistance: Organization         Address  Phone   Notes  [facility] Medication [facility] [address] [place], [place] [address] [phone] --Must be a resident of [facility] -- Must have NO insurance coverage whatsoever (no Medicaid/ Medicare, etc.) -- The pt. MUST have a primary care doctor that directs their care regularly and follows them in the community   [facility]  [phone]   [place]  [phone]     Dental Care: Organization         Address  Phone  Notes  [facility] Department of [facility] [place] [phone] Accepts children up to age 89 who are enrolled in [place] or [place]; pregnant women with a Medicaid card; and children who have applied for Medicaid or [place] Health Choice, but were declined, whose parents can pay a reduced fee at time of service.  [facility] Department of [facility]  [address], [place] [phone] Accepts children up to age 41 who are enrolled  in Medicaid or [place] Health Choice; pregnant women with a Medicaid card; and children who have applied for Medicaid or [place] Health Choice, but were declined, whose parents can pay a reduced fee at time of service.  [facility]  [place] [phone] Patients are seen by appointment only. Walk-ins are not accepted. [place] will see patients 42 years of age and older. Monday - Tuesday (8am-5pm) Most Wednesdays (8:30-5pm) $30 per visit, cash only  [facility] Adult Dental Access PROGRAM  [address], [facility] [phone] Patients are seen by appointment only. Walk-ins are not accepted. [place] will see patients 85 years of age and older. One Wednesday Evening (Monthly: Volunteer Based).  $30 per visit, cash  only  [place]  [phone] for adults; Children under age 7, call Graduate Pediatric Dentistry at [phone]. Children aged 76-14, please call [phone] to request a pediatric application.  Dental services are provided in all areas of dental care including fillings, crowns and bridges, complete and partial dentures, implants, gum treatment, root canals, and extractions. Preventive care is also provided. Treatment is provided to both adults and children. Patients are selected via a lottery and there is often a waiting list.   [facility] [address] [place]  [phone] [email or web address]   [facility] [address] [place], [place] [phone], Ext. [phone] Second and Fourth Thursday of each month, opens at 6:30 AM; Clinic ends at 9 AM.  Patients are seen on a first-come first-served basis, and a limited number are seen during each clinic.   [facility]  [address] [place]ard Danker [place], [place] [phone]   Eligibility Requirements You must have lived in [place], [place], or [place] counties for at least the last three months.   You cannot be eligible for state or federal sponsored [employer], including [employer], [place], or [employer].   You generally cannot be eligible for healthcare insurance through your employer.    How to apply: Eligibility screenings are held every Tuesday and Wednesday afternoon from 1:00 pm until 4:00 pm. You do not need an appointment for the interview!  [facility] [address] [place], [place]   [place]  [place]  [place]  [phone]

## 2014-10-18 NOTE — ED Notes (Signed)
Patient transported to X-ray 

## 2014-10-18 NOTE — ED Notes (Signed)
He c/o right upper rear toothache for about a week.  He also states he injured his right knee while assisting in pushing a car today.  He is in no distress.

## 2014-10-18 NOTE — ED Provider Notes (Signed)
CSN: [id number]     Arrival date & time 10/18/14  1218 History   First MD Initiated Contact with Patient 10/18/14 1300     Chief Complaint  Patient presents with  . Dental Pain  . Knee Injury     (Consider location/radiation/quality/duration/timing/severity/associated sxs/prior Treatment) HPI Comments: KHRIS JANSSON is a 39 y.o. male with a PMHx of DM2, HTN, asthma, GERD, and chronic headaches, who presents to the ED with complaints of right upper molar pain 4 days. He describes the pain is 6/10 constant throbbing nonradiating pain worse with chewing and somewhat relieved with Anbesol and ibuprofen. Associated symptoms include right-sided facial swelling. He also reports that earlier today he was pushing a car and slipped on some oil, hitting his knee on the concrete, and complains of right knee pain and swelling. This occurred at 10 AM.  He denies any fevers, chills, URI symptoms, ear pain/drainage, neck pain/swelling, gum drainage/bleeding, trismus, drooling, CP, SOB, abd pain, N/V/D/C, hematuria, dysuria, myalgias, numbness, tingling, weakness, or erythema/warmth of R knee.  He does endorse smoking tobacco products, 1/2ppd.   Patient is a 39 y.o. male presenting with tooth pain. The history is provided by the patient. No language interpreter was used.  Dental Pain Location:  Upper Upper teeth location:  2/RU 2nd molar Quality:  Throbbing Severity:  Moderate Onset quality:  Gradual Duration:  4 days Timing:  Constant Progression:  Unchanged Chronicity:  Recurrent Context: dental caries   Relieved by:  Topical anesthetic gel and NSAIDs Worsened by:  Touching Ineffective treatments:  None tried Associated symptoms: facial swelling (R sided)   Associated symptoms: no difficulty swallowing, no drooling, no fever, no gum swelling, no neck pain, no neck swelling, no oral bleeding and no trismus   Risk factors: diabetes and smoking     Past Medical History  Diagnosis Date  .  Diabetes mellitus   . Hypertension   . Asthma   . GERD (gastroesophageal reflux disease)   . MLYYTKPT(465.6)    Past Surgical History  Procedure Laterality Date  . Bilateral hip pins placed     Family History  Problem Relation Age of Onset  . Arthritis    . Diabetes    . Hypertension    . Hyperlipidemia    . Stroke    . Sudden death     Social History  Substance Use Topics  . Smoking status: Former Smoker    Types: Cigarettes  . Smokeless tobacco: Never Used  . Alcohol Use: Yes     Comment: once or twice a month    Review of Systems  Constitutional: Negative for fever and chills.  HENT: Positive for dental problem and facial swelling (R sided). Negative for drooling, ear discharge, ear pain, rhinorrhea, sore throat and trouble swallowing.   Respiratory: Negative for shortness of breath.   Cardiovascular: Negative for chest pain.  Gastrointestinal: Negative for nausea, vomiting, abdominal pain, diarrhea and constipation.  Genitourinary: Negative for dysuria and hematuria.  Musculoskeletal: Positive for joint swelling (R knee) and arthralgias (R knee). Negative for myalgias and neck pain.  Skin: Negative for color change.  Allergic/Immunologic: Positive for immunocompromised state (diabetic).  Neurological: Negative for weakness and numbness.  Psychiatric/Behavioral: Negative for confusion.   10 Systems reviewed and are negative for acute change except as noted in the HPI.    Allergies  Review of patient's allergies indicates no known allergies.  Home Medications   Prior to Admission medications   Medication Sig Start Date End Date  Taking? Authorizing Provider  cyclobenzaprine (FLEXERIL) 10 MG tablet Take 1 tablet (10 mg total) by mouth 3 (three) times daily as needed for muscle spasms. 05/30/13  Yes [name], MD  glipiZIDE (GLUCOTROL) 10 MG tablet Take 1 tablet (10 mg total) by mouth 2 (two) times daily before a meal. 04/29/13  Yes [name], MD  ibuprofen  (ADVIL,MOTRIN) 200 MG tablet Take 600 mg by mouth every 6 (six) hours as needed. As needed for pain.   Yes Historical Provider, MD  lisinopril (PRINIVIL,ZESTRIL) 20 MG tablet Take 1 tablet (20 mg total) by mouth daily. 04/23/13  Yes [name], MD  metFORMIN (GLUCOPHAGE) 1000 MG tablet Take 1 tablet (1,000 mg total) by mouth 2 (two) times daily with a meal. 04/23/13  Yes [name], MD  saxagliptin HCl (ONGLYZA) 5 MG TABS tablet Take 1 tablet (5 mg total) by mouth daily. 07/25/13  Yes [name], MD  amLODipine (NORVASC) 5 MG tablet Take 1 tablet (5 mg total) by mouth daily. Patient not taking: Reported on 10/18/2014 07/15/13   [name], MD  naproxen (NAPROSYN) 500 MG tablet Take 1 tablet (500 mg total) by mouth 2 (two) times daily. Patient not taking: Reported on 10/18/2014 05/12/14   Britt Bottom, NP  oxyCODONE-acetaminophen (PERCOCET/ROXICET) 5-325 MG per tablet Take 1 tablet by mouth every 4 (four) hours as needed for severe pain. Patient not taking: Reported on 10/18/2014 05/12/14   Britt Bottom, NP   BP 164/98 mmHg  Pulse 89  Temp(Src) 98.1 F (36.7 C) (Oral)  Resp 20  SpO2 100% Physical Exam  Constitutional: He is oriented to person, place, and time. Vital signs are normal. He appears well-developed and well-nourished.  Non-toxic appearance. No distress.  Afebrile, nontoxic, NAD  HENT:  Head: Normocephalic and atraumatic.  Nose: Nose normal.  Mouth/Throat: Uvula is midline, oropharynx is clear and moist and mucous membranes are normal. No trismus in the jaw. Dental abscesses and dental caries present. No uvula swelling.    R upper molar #2 decayed with mild surrounding erythema and gingival swelling, slight facial swelling in this area, no obvious abscess defined yet but appears to be early abscess. Nose clear. Oropharynx clear and moist, without uvular swelling or deviation, no trismus or drooling, no tonsillar swelling or erythema, no exudates.    Eyes: Conjunctivae  and EOM are normal. Right eye exhibits no discharge. Left eye exhibits no discharge.  Neck: Normal range of motion. Neck supple.  Cardiovascular: Normal rate, regular rhythm, normal heart sounds and intact distal pulses.  Exam reveals no gallop and no friction rub.   No murmur heard. Pulmonary/Chest: Effort normal and breath sounds normal. No respiratory distress. He has no decreased breath sounds. He has no wheezes. He has no rhonchi. He has no rales.  Abdominal: Soft. Normal appearance and bowel sounds are normal. He exhibits no distension. There is no tenderness. There is no rigidity, no rebound and no guarding.  Musculoskeletal:       Right knee: He exhibits decreased range of motion (due to pain). He exhibits no swelling, no effusion, no deformity, no laceration, no erythema, normal alignment, no LCL laxity, normal patellar mobility and no MCL laxity. Tenderness found. Medial joint line tenderness noted.  R knee with mildly diminished ROM due to pain, mild medial joint line TTP, no swelling/effusion/deformity, no bruising or abrasions, no abnormal alignment or patellar mobility, no varus/valgus laxity, neg anterior drawer test, no crepitus. Strength and sensation grossly intact,  distal pulses intact.   Lymphadenopathy:       Head (right side): Submandibular adenopathy present.    He has cervical adenopathy.       Right cervical: Superficial cervical adenopathy present.  Mild R sided submandibular and superficial cervical LAD with mild TTP, no other LAD  Neurological: He is alert and oriented to person, place, and time. He has normal strength. No sensory deficit.  Skin: Skin is warm, dry and intact. No rash noted.  Psychiatric: He has a normal mood and affect.  Nursing note and vitals reviewed.   ED Course  Procedures (including critical care time) Labs Review Labs Reviewed - No data to display  Imaging Review Dg Knee Complete 4 Views Right  10/18/2014   CLINICAL DATA:  39 year old male  with history of trauma from a fall earlier today complaining of medial right knee pain.  EXAM: RIGHT KNEE - COMPLETE 4+ VIEW  COMPARISON:  11/09/2010.  FINDINGS: Multiple views of the right knee demonstrate no acute displaced fracture, subluxation, dislocation, or soft tissue abnormality. Mild joint space narrowing, subchondral sclerosis and osteophyte formation, most evident in the lateral compartment, compatible with mild osteoarthritis.  IMPRESSION: No acute radiographic abnormality of the right knee.   Electronically Signed   By: Vinnie Langton M.D.   On: 10/18/2014 14:01   I have personally reviewed and evaluated these images and lab results as part of my medical decision-making.   EKG Interpretation None      MDM   Final diagnoses:  Pain due to dental caries  Dental abscess  Right knee pain  Knee sprain and strain, right, initial encounter  HTN (hypertension), benign  Tobacco abuse    39 y.o. male here with Dental pain associated with dental decay and possible dental infection with patient afebrile, non toxic appearing and swallowing secretions well. Will give pain meds. Will give patient referral to dentist and discussed the importance of dental follow up for ultimate management of dental pain.  I have also discussed reasons to return immediately to the ER.  Patient expresses understanding and agrees with plan.  I will also give doxycycline and pain control to go home with.   Also complains of R knee pain after falling on it this morning, mild tenderness to medial joint line. NVI with soft compartments. Will obtain imaging. Will give pain meds and reassess.  2:23 PM Pain improving. Xray neg. Will apply knee sleeve and give crutches. Discussed RICE therapy, and pain meds given. Discussed dental f/up in 1-2 days, and ortho f/up in 1-2wks for ongoing pain. Smoking cessation discussed. I explained the diagnosis and have given explicit precautions to return to the ER including for any  other new or worsening symptoms. The patient understands and accepts the medical plan as it's been dictated and I have answered their questions. Discharge instructions concerning home care and prescriptions have been given. The patient is STABLE and is discharged to home in good condition.  BP 164/98 mmHg  Pulse 89  Temp(Src) 98.1 F (36.7 C) (Oral)  Resp 20  SpO2 100%  Meds ordered this encounter  Medications  . HYDROcodone-acetaminophen (NORCO/VICODIN) 5-325 MG per tablet 1 tablet    Sig:   . naproxen (NAPROSYN) 500 MG tablet    Sig: Take 1 tablet (500 mg total) by mouth 2 (two) times daily as needed for mild pain, moderate pain or headache (TAKE WITH MEALS.).    Dispense:  20 tablet    Refill:  0  Order Specific Question:  Supervising Provider    Answer:  Noemi Chapel [[id number]]  . HYDROcodone-acetaminophen (NORCO) 5-325 MG per tablet    Sig: Take 1 tablet by mouth every 6 (six) hours as needed for severe pain.    Dispense:  6 tablet    Refill:  0    Order Specific Question:  Supervising Provider    Answer:  MILLER, BRIAN [[id number]]  . doxycycline (VIBRAMYCIN) 100 MG capsule    Sig: Take 1 capsule (100 mg total) by mouth 2 (two) times daily. One po bid x 7 days    Dispense:  14 capsule    Refill:  0    Order Specific Question:  Supervising Provider    Answer:  Noemi Chapel [[id number]]     Hannalee Castor Camprubi-Soms, PA-C 10/18/14 1426  Daleen Bo, MD 10/18/14 1553

## 2015-03-26 ENCOUNTER — Encounter: Payer: Self-pay | Admitting: Family Medicine

## 2015-03-26 ENCOUNTER — Ambulatory Visit (INDEPENDENT_AMBULATORY_CARE_PROVIDER_SITE_OTHER): Payer: Self-pay | Admitting: Family Medicine

## 2015-03-26 VITALS — BP 161/90 | HR 93 | Temp 98.3°F | Ht 69.5 in | Wt 224.0 lb

## 2015-03-26 DIAGNOSIS — E119 Type 2 diabetes mellitus without complications: Secondary | ICD-10-CM

## 2015-03-26 DIAGNOSIS — I10 Essential (primary) hypertension: Secondary | ICD-10-CM

## 2015-03-26 MED ORDER — METFORMIN HCL 1000 MG PO TABS: 1000.0000 mg | ORAL_TABLET | Freq: Two times a day (BID) | ORAL | Status: DC

## 2015-03-26 MED ORDER — GLIPIZIDE 10 MG PO TABS: 10.0000 mg | ORAL_TABLET | Freq: Two times a day (BID) | ORAL | Status: DC

## 2015-03-26 MED ORDER — AMLODIPINE BESYLATE 5 MG PO TABS: 5.0000 mg | ORAL_TABLET | Freq: Every day | ORAL | Status: DC

## 2015-03-26 MED ORDER — SAXAGLIPTIN HCL 5 MG PO TABS: 5.0000 mg | ORAL_TABLET | Freq: Every day | ORAL | Status: DC

## 2015-03-26 MED ORDER — LISINOPRIL 20 MG PO TABS: 20.0000 mg | ORAL_TABLET | Freq: Every day | ORAL | Status: DC

## 2015-03-26 NOTE — Progress Notes (Signed)
Pre visit review using our clinic review tool, if applicable. No additional management support is needed unless otherwise documented below in the visit note. 

## 2015-03-26 NOTE — Progress Notes (Signed)
   Subjective:    Patient ID: Danny Fuller, male    DOB: [birth year], 40 y.o.   MRN: [id number]  HPI Here to follow up on HTN and diabetes. He has been very non-compliant in the past with medications and continues to be so. We added Amlodipine to his Lisinopril about one and 1/2 years ago, but apparently he never took it. He stayed on Lisinopril until he ran out of this about 2 weeks ago. He was also to have been taking Onglyza along with Glipizide and Metformin, but he never took the [place]. 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 He was then told to see me to be cleared as to whether he can work. He feels fine and has no complaints. He has not checked a glucose for many months.    Review of Systems  Constitutional: Negative.   Respiratory: Negative.   Cardiovascular: Negative.   Endocrine: Negative.   Neurological: Negative.        Objective:   Physical Exam  Constitutional: He is oriented to person, place, and time. He appears well-developed and well-nourished.  Neck: No thyromegaly present.  Cardiovascular: Normal rate, regular rhythm, normal heart sounds and intact distal pulses.   Pulmonary/Chest: Effort normal and breath sounds normal.  Lymphadenopathy:    He has no cervical adenopathy.  Neurological: He is alert and oriented to person, place, and time. No cranial nerve deficit. He exhibits normal muscle tone. Coordination normal.          Assessment & Plan:  His HTN is poorly controlled, so I wrote for him to start on Amlodipine 5 mg daily along with the Lisinopril. His diabetes is most likely poorly controlled, so I wrote for him to start on Onglyza 5 mg daily along with the Glipizide and the Metformin. We will check a BMET and an A1c today. He will return for a recheck in 3-4 weeks and also for a cpx.

## 2015-03-27 LAB — BASIC METABOLIC PANEL
BUN: 9 mg/dL (ref 7–25)
CO2: 26 mmol/L (ref 20–31)
Calcium: 9.7 mg/dL (ref 8.6–10.3)
Chloride: 102 mmol/L (ref 98–110)
Creat: 0.82 mg/dL (ref 0.60–1.35)
GLUCOSE: 319 mg/dL — AB (ref 65–99)
POTASSIUM: 4.2 mmol/L (ref 3.5–5.3)
Sodium: 136 mmol/L (ref 135–146)

## 2015-03-27 LAB — HEMOGLOBIN A1C
Hgb A1c MFr Bld: 12.9 % — ABNORMAL HIGH (ref <5.7)
Mean Plasma Glucose: 324 mg/dL — ABNORMAL HIGH (ref <117)

## 2015-04-23 ENCOUNTER — Ambulatory Visit (INDEPENDENT_AMBULATORY_CARE_PROVIDER_SITE_OTHER): Payer: Self-pay | Admitting: Family Medicine

## 2015-04-23 ENCOUNTER — Encounter: Payer: Self-pay | Admitting: Family Medicine

## 2015-04-23 VITALS — BP 144/91 | HR 87 | Temp 98.2°F | Ht 69.5 in | Wt 222.0 lb

## 2015-04-23 DIAGNOSIS — N521 Erectile dysfunction due to diseases classified elsewhere: Secondary | ICD-10-CM | POA: Insufficient documentation

## 2015-04-23 DIAGNOSIS — E119 Type 2 diabetes mellitus without complications: Secondary | ICD-10-CM

## 2015-04-23 DIAGNOSIS — I10 Essential (primary) hypertension: Secondary | ICD-10-CM

## 2015-04-23 MED ORDER — SILDENAFIL CITRATE 100 MG PO TABS: 100.0000 mg | ORAL_TABLET | Freq: Every day | ORAL | Status: DC | PRN

## 2015-04-23 NOTE — Progress Notes (Signed)
   Subjective:    Patient ID: Danny Fuller, male    DOB: March 12, 1975, 40 y.o.   MRN: [id number]  HPI Here to follow up. He feels better in general and he is proud to say he quit smoking 5 days ago. His BP is coming down. His fasting glucoses have come down to the 120-140 range. His A1c a month ago was 12.9. He also complains of problems getting and maintaining erections. His libido remains high.    Review of Systems  Constitutional: Negative.   Respiratory: Negative.   Cardiovascular: Negative.   Genitourinary: Negative.   Neurological: Negative.        Objective:   Physical Exam  Constitutional: He is oriented to person, place, and time. He appears well-developed and well-nourished.  Neck: No thyromegaly present.  Cardiovascular: Normal rate, regular rhythm, normal heart sounds and intact distal pulses.   Pulmonary/Chest: Effort normal and breath sounds normal.  Lymphadenopathy:    He has no cervical adenopathy.  Neurological: He is alert and oriented to person, place, and time.          Assessment & Plan:  His HTN is improving and his diabetic control is improving. Try Viagra for ED. Recheck with another A1c in 2 months

## 2015-04-23 NOTE — Progress Notes (Signed)
Pre visit review using our clinic review tool, if applicable. No additional management support is needed unless otherwise documented below in the visit note. 

## 2015-04-26 ENCOUNTER — Telehealth: Payer: Self-pay | Admitting: Family Medicine

## 2015-04-26 NOTE — Telephone Encounter (Signed)
Pt states his insurance will pay for cialis. Pt needs a new rx sent to: [facility]/ [address]

## 2015-04-27 MED ORDER — TADALAFIL 20 MG PO TABS: 20.0000 mg | ORAL_TABLET | Freq: Every day | ORAL | Status: DC | PRN

## 2015-04-27 NOTE — Telephone Encounter (Signed)
I sent script e-scribe. 

## 2015-04-27 NOTE — Telephone Encounter (Signed)
Call in Cialis 20 mg prn, #10 with 11 rf 

## 2015-06-13 ENCOUNTER — Encounter (HOSPITAL_COMMUNITY): Payer: Self-pay | Admitting: *Deleted

## 2015-06-13 ENCOUNTER — Emergency Department (HOSPITAL_COMMUNITY)
Admission: EM | Admit: 2015-06-13 | Discharge: 2015-06-13 | Disposition: A | Discharge status: Home / Self Care | Payer: Self-pay | Attending: Emergency Medicine | Admitting: Emergency Medicine

## 2015-06-13 DIAGNOSIS — J45909 Unspecified asthma, uncomplicated: Secondary | ICD-10-CM | POA: Insufficient documentation

## 2015-06-13 DIAGNOSIS — M62830 Muscle spasm of back: Secondary | ICD-10-CM | POA: Insufficient documentation

## 2015-06-13 DIAGNOSIS — Z8719 Personal history of other diseases of the digestive system: Secondary | ICD-10-CM | POA: Insufficient documentation

## 2015-06-13 DIAGNOSIS — I10 Essential (primary) hypertension: Secondary | ICD-10-CM | POA: Insufficient documentation

## 2015-06-13 DIAGNOSIS — Z7984 Long term (current) use of oral hypoglycemic drugs: Secondary | ICD-10-CM | POA: Insufficient documentation

## 2015-06-13 DIAGNOSIS — M25511 Pain in right shoulder: Secondary | ICD-10-CM | POA: Insufficient documentation

## 2015-06-13 DIAGNOSIS — Z79899 Other long term (current) drug therapy: Secondary | ICD-10-CM | POA: Insufficient documentation

## 2015-06-13 DIAGNOSIS — Z87891 Personal history of nicotine dependence: Secondary | ICD-10-CM | POA: Insufficient documentation

## 2015-06-13 DIAGNOSIS — E119 Type 2 diabetes mellitus without complications: Secondary | ICD-10-CM | POA: Insufficient documentation

## 2015-06-13 MED ORDER — DIAZEPAM 5 MG/ML IJ SOLN
5.0000 mg | Freq: Once | INTRAMUSCULAR | Status: AC
  Administered 2015-06-13: 5 mg via INTRAMUSCULAR
  Filled 2015-06-13: qty 2

## 2015-06-13 MED ORDER — HYDROCODONE-ACETAMINOPHEN 5-325 MG PO TABS
2.0000 | ORAL_TABLET | Freq: Once | ORAL | Status: AC
  Administered 2015-06-13: 2 via ORAL
  Filled 2015-06-13: qty 2

## 2015-06-13 MED ORDER — METHOCARBAMOL 500 MG PO TABS: 500.0000 mg | ORAL_TABLET | Freq: Two times a day (BID) | ORAL | Status: DC

## 2015-06-13 NOTE — Discharge Instructions (Signed)

## 2015-06-13 NOTE — ED Provider Notes (Signed)
CSN: [id number]     Arrival date & time 06/13/15  2024 History   First MD Initiated Contact with Patient 06/13/15 2158     Chief Complaint  Patient presents with  . Neck Pain  . Shoulder Pain     (Consider location/radiation/quality/duration/timing/severity/associated sxs/prior Treatment) HPI Comments: Patient presents to the emergency department with chief complaint of upper back and shoulder stiffness. He states that he first noticed the symptoms when he was bending to get into the car 2 days ago. He states that his symptoms are worsened with movement and palpation. He is tried using muscle rub with no relief. He denies any fevers or chills. Denies any other mechanism of injury. Denies any other symptoms.  The history is provided by the patient. No language interpreter was used.    Past Medical History  Diagnosis Date  . Diabetes mellitus   . Hypertension   . Asthma   . GERD (gastroesophageal reflux disease)   . [id number]    Past Surgical History  Procedure Laterality Date  . Bilateral hip pins placed     Family History  Problem Relation Age of Onset  . Arthritis    . Diabetes    . Hypertension    . Hyperlipidemia    . Stroke    . Sudden death     Social History  Substance Use Topics  . Smoking status: Former Smoker    Types: Cigarettes  . Smokeless tobacco: Never Used     Comment: quit 5 days ago  . Alcohol Use: 0.0 oz/week    0 Standard drinks or equivalent per week     Comment: once or twice a month    Review of Systems  Constitutional: Negative for fever and chills.  Gastrointestinal:       No bowel incontinence  Genitourinary:       No urinary incontinence  Musculoskeletal: Positive for myalgias and back pain.  Neurological:       No saddle anesthesia  All other systems reviewed and are negative.     Allergies  Review of patient's allergies indicates no known allergies.  Home Medications   Prior to Admission medications   Medication Sig  Start Date End Date Taking? Authorizing Provider  amLODipine (NORVASC) 5 MG tablet Take 1 tablet (5 mg total) by mouth daily. 03/26/15  Yes [name], MD  glipiZIDE (GLUCOTROL) 10 MG tablet Take 1 tablet (10 mg total) by mouth 2 (two) times daily before a meal. 03/26/15  Yes [name], MD  lisinopril (PRINIVIL,ZESTRIL) 20 MG tablet Take 1 tablet (20 mg total) by mouth daily. 03/26/15  Yes [name], MD  metFORMIN (GLUCOPHAGE) 1000 MG tablet Take 1 tablet (1,000 mg total) by mouth 2 (two) times daily with a meal. 03/26/15  Yes [name], MD  sildenafil (VIAGRA) 100 MG tablet Take 1 tablet (100 mg total) by mouth daily as needed for erectile dysfunction. Patient not taking: Reported on 06/13/2015 04/23/15   [name], MD  tadalafil (CIALIS) 20 MG tablet Take 1 tablet (20 mg total) by mouth daily as needed for erectile dysfunction. Patient not taking: Reported on 06/13/2015 04/27/15   [name], MD   BP 142/92 mmHg  Pulse 110  Temp(Src) 98.2 F (36.8 C) (Oral)  Resp 20  SpO2 100% Physical Exam  Constitutional: He is oriented to person, place, and time. He appears well-developed and well-nourished. No distress.  HENT:  Head: Normocephalic and atraumatic.  Eyes: Conjunctivae  and EOM are normal. Right eye exhibits no discharge. Left eye exhibits no discharge. No scleral icterus.  Neck: Normal range of motion. Neck supple. No tracheal deviation present.  Cardiovascular: Normal rate, regular rhythm and normal heart sounds.  Exam reveals no gallop and no friction rub.   No murmur heard. Pulmonary/Chest: Effort normal and breath sounds normal. No respiratory distress. He has no wheezes.  Abdominal: Soft. He exhibits no distension. There is no tenderness.  Musculoskeletal: Normal range of motion.  Left upper trap and rhomboids tender to palpation, no bony tenderness, step-offs, or gross abnormality or deformity of spine, patient is able to ambulate, moves all extremities     Neurological: He is alert and oriented to person, place, and time.  Sensation and strength intact bilaterally   Skin: Skin is warm. He is not diaphoretic.  Psychiatric: He has a normal mood and affect. His behavior is normal. Judgment and thought content normal.  Nursing note and vitals reviewed.   ED Course  Procedures (including critical care time)   MDM   Final diagnoses:  Muscle spasm of back    Patient with symptoms consistent with muscle spasm versus strain of left upper rhomboid/trapezius. Lungs are clear.   Will treat pain in the ED, and discharge home with a muscle relaxer. Recommend stretching, and rice therapy. Primary care follow-up. Vital signs stable.    Montine Circle, PA-C 06/13/15 2248  Leo Grosser, MD 06/14/15 [phone]

## 2015-06-13 NOTE — ED Notes (Signed)
Pt complains of neck/shoulder pain and stiffness for the past 2 days. Pt denies injury to neck. Pt states the pain is worse on his left side. Pt tried muscle rub with no relief.

## 2015-06-23 ENCOUNTER — Telehealth: Payer: Self-pay | Admitting: Family Medicine

## 2015-06-23 ENCOUNTER — Ambulatory Visit: Payer: Self-pay | Admitting: Family Medicine

## 2015-06-23 DIAGNOSIS — Z0289 Encounter for other administrative examinations: Secondary | ICD-10-CM

## 2015-06-23 NOTE — Telephone Encounter (Signed)
Pt was on schedule to see Dr. Sarajane Jews today. I called and spoke with pt, he forgot about appointment, he will reschedule this.

## 2015-07-20 ENCOUNTER — Emergency Department (HOSPITAL_COMMUNITY)
Admission: EM | Admit: 2015-07-20 | Discharge: 2015-07-20 | Disposition: A | Discharge status: Home / Self Care | Payer: BC Managed Care – PPO

## 2015-07-23 ENCOUNTER — Emergency Department (HOSPITAL_COMMUNITY): Payer: BC Managed Care – PPO

## 2015-07-23 ENCOUNTER — Inpatient Hospital Stay (HOSPITAL_COMMUNITY)
Admission: EM | Admit: 2015-07-23 | Discharge: 2015-07-29 | DRG: 854 | Disposition: A | Discharge status: Home / Self Care | Payer: BC Managed Care – PPO | Attending: Internal Medicine | Admitting: Internal Medicine

## 2015-07-23 ENCOUNTER — Encounter (HOSPITAL_COMMUNITY): Payer: Self-pay

## 2015-07-23 DIAGNOSIS — E1165 Type 2 diabetes mellitus with hyperglycemia: Secondary | ICD-10-CM | POA: Diagnosis present

## 2015-07-23 DIAGNOSIS — L0390 Cellulitis, unspecified: Secondary | ICD-10-CM | POA: Diagnosis present

## 2015-07-23 DIAGNOSIS — E669 Obesity, unspecified: Secondary | ICD-10-CM | POA: Diagnosis present

## 2015-07-23 DIAGNOSIS — E08621 Diabetes mellitus due to underlying condition with foot ulcer: Secondary | ICD-10-CM | POA: Diagnosis not present

## 2015-07-23 DIAGNOSIS — E11621 Type 2 diabetes mellitus with foot ulcer: Secondary | ICD-10-CM | POA: Diagnosis present

## 2015-07-23 DIAGNOSIS — I10 Essential (primary) hypertension: Secondary | ICD-10-CM | POA: Diagnosis present

## 2015-07-23 DIAGNOSIS — E11622 Type 2 diabetes mellitus with other skin ulcer: Secondary | ICD-10-CM | POA: Diagnosis present

## 2015-07-23 DIAGNOSIS — K219 Gastro-esophageal reflux disease without esophagitis: Secondary | ICD-10-CM | POA: Diagnosis present

## 2015-07-23 DIAGNOSIS — M869 Osteomyelitis, unspecified: Secondary | ICD-10-CM | POA: Diagnosis not present

## 2015-07-23 DIAGNOSIS — L97529 Non-pressure chronic ulcer of other part of left foot with unspecified severity: Secondary | ICD-10-CM | POA: Diagnosis present

## 2015-07-23 DIAGNOSIS — E1140 Type 2 diabetes mellitus with diabetic neuropathy, unspecified: Secondary | ICD-10-CM | POA: Diagnosis present

## 2015-07-23 DIAGNOSIS — E43 Unspecified severe protein-calorie malnutrition: Secondary | ICD-10-CM | POA: Diagnosis not present

## 2015-07-23 DIAGNOSIS — E119 Type 2 diabetes mellitus without complications: Secondary | ICD-10-CM

## 2015-07-23 DIAGNOSIS — M86679 Other chronic osteomyelitis, unspecified ankle and foot: Secondary | ICD-10-CM | POA: Diagnosis present

## 2015-07-23 DIAGNOSIS — R519 Headache, unspecified: Secondary | ICD-10-CM | POA: Diagnosis present

## 2015-07-23 DIAGNOSIS — M79609 Pain in unspecified limb: Secondary | ICD-10-CM | POA: Diagnosis not present

## 2015-07-23 DIAGNOSIS — Z7984 Long term (current) use of oral hypoglycemic drugs: Secondary | ICD-10-CM | POA: Diagnosis not present

## 2015-07-23 DIAGNOSIS — L97509 Non-pressure chronic ulcer of other part of unspecified foot with unspecified severity: Secondary | ICD-10-CM | POA: Diagnosis not present

## 2015-07-23 DIAGNOSIS — R739 Hyperglycemia, unspecified: Secondary | ICD-10-CM

## 2015-07-23 DIAGNOSIS — Z794 Long term (current) use of insulin: Secondary | ICD-10-CM | POA: Diagnosis not present

## 2015-07-23 DIAGNOSIS — J45909 Unspecified asthma, uncomplicated: Secondary | ICD-10-CM | POA: Diagnosis present

## 2015-07-23 DIAGNOSIS — Z6834 Body mass index (BMI) 34.0-34.9, adult: Secondary | ICD-10-CM | POA: Diagnosis not present

## 2015-07-23 DIAGNOSIS — M79672 Pain in left foot: Secondary | ICD-10-CM | POA: Diagnosis present

## 2015-07-23 DIAGNOSIS — M86172 Other acute osteomyelitis, left ankle and foot: Secondary | ICD-10-CM

## 2015-07-23 DIAGNOSIS — R609 Edema, unspecified: Secondary | ICD-10-CM

## 2015-07-23 DIAGNOSIS — Z87891 Personal history of nicotine dependence: Secondary | ICD-10-CM

## 2015-07-23 DIAGNOSIS — IMO0001 Reserved for inherently not codable concepts without codable children: Secondary | ICD-10-CM

## 2015-07-23 DIAGNOSIS — K5900 Constipation, unspecified: Secondary | ICD-10-CM | POA: Diagnosis present

## 2015-07-23 DIAGNOSIS — A419 Sepsis, unspecified organism: Principal | ICD-10-CM | POA: Diagnosis present

## 2015-07-23 DIAGNOSIS — E871 Hypo-osmolality and hyponatremia: Secondary | ICD-10-CM | POA: Diagnosis present

## 2015-07-23 DIAGNOSIS — E1169 Type 2 diabetes mellitus with other specified complication: Secondary | ICD-10-CM | POA: Diagnosis present

## 2015-07-23 DIAGNOSIS — G8929 Other chronic pain: Secondary | ICD-10-CM | POA: Diagnosis present

## 2015-07-23 DIAGNOSIS — R51 Headache: Secondary | ICD-10-CM

## 2015-07-23 DIAGNOSIS — L03116 Cellulitis of left lower limb: Secondary | ICD-10-CM | POA: Diagnosis present

## 2015-07-23 DIAGNOSIS — B9689 Other specified bacterial agents as the cause of diseases classified elsewhere: Secondary | ICD-10-CM | POA: Diagnosis not present

## 2015-07-23 LAB — CBC WITH DIFFERENTIAL/PLATELET
BASOS ABS: 0 10*3/uL (ref 0.0–0.1)
BASOS PCT: 0 %
EOS PCT: 0 %
Eosinophils Absolute: 0 10*3/uL (ref 0.0–0.7)
HCT: 39.9 % (ref 39.0–52.0)
Hemoglobin: 14.2 g/dL (ref 13.0–17.0)
Lymphocytes Relative: 14 %
Lymphs Abs: 2.5 10*3/uL (ref 0.7–4.0)
MCH: 30.1 pg (ref 26.0–34.0)
MCHC: 35.6 g/dL (ref 30.0–36.0)
MCV: 84.7 fL (ref 78.0–100.0)
MONO ABS: 1.3 10*3/uL — AB (ref 0.1–1.0)
Monocytes Relative: 7 %
Neutro Abs: 13.8 10*3/uL — ABNORMAL HIGH (ref 1.7–7.7)
Neutrophils Relative %: 79 %
PLATELETS: 330 10*3/uL (ref 150–400)
RBC: 4.71 MIL/uL (ref 4.22–5.81)
RDW: 11.8 % (ref 11.5–15.5)
WBC: 17.6 10*3/uL — AB (ref 4.0–10.5)

## 2015-07-23 LAB — COMPREHENSIVE METABOLIC PANEL
ALT: 14 U/L — AB (ref 17–63)
ANION GAP: 14 (ref 5–15)
AST: 18 U/L (ref 15–41)
Albumin: 3.7 g/dL (ref 3.5–5.0)
Alkaline Phosphatase: 99 U/L (ref 38–126)
BUN: 10 mg/dL (ref 6–20)
CALCIUM: 9.6 mg/dL (ref 8.9–10.3)
CO2: 21 mmol/L — ABNORMAL LOW (ref 22–32)
CREATININE: 0.95 mg/dL (ref 0.61–1.24)
Chloride: 93 mmol/L — ABNORMAL LOW (ref 101–111)
GFR calc Af Amer: >60 mL/min (ref >60)
Glucose, Bld: 443 mg/dL — ABNORMAL HIGH (ref 65–99)
Potassium: 4 mmol/L (ref 3.5–5.1)
Sodium: 128 mmol/L — ABNORMAL LOW (ref 135–145)
Total Bilirubin: 1.2 mg/dL (ref 0.3–1.2)
Total Protein: 7.9 g/dL (ref 6.5–8.1)

## 2015-07-23 LAB — URINALYSIS, ROUTINE W REFLEX MICROSCOPIC
Bilirubin Urine: NEGATIVE
KETONES UR: 15 mg/dL — AB
Leukocytes, UA: NEGATIVE
Nitrite: NEGATIVE
PH: 5.5 (ref 5.0–8.0)
PROTEIN: 30 mg/dL — AB
Specific Gravity, Urine: 1.045 — ABNORMAL HIGH (ref 1.005–1.030)

## 2015-07-23 LAB — URINE MICROSCOPIC-ADD ON: BACTERIA UA: NONE SEEN

## 2015-07-23 LAB — I-STAT CG4 LACTIC ACID, ED
Lactic Acid, Venous: 2.54 mmol/L (ref 0.5–2.0)
Lactic Acid, Venous: 1.96 mmol/L (ref 0.5–2.0)
Lactic Acid, Venous: 1.18 mmol/L (ref 0.5–2.0)

## 2015-07-23 LAB — GLUCOSE, CAPILLARY: Glucose-Capillary: 297 mg/dL — ABNORMAL HIGH (ref 65–99)

## 2015-07-23 MED ORDER — ENOXAPARIN SODIUM 40 MG/0.4ML ~~LOC~~ SOLN
40.0000 mg | Freq: Every day | SUBCUTANEOUS | Status: DC
  Administered 2015-07-24 – 2015-07-29 (×6): 40 mg via SUBCUTANEOUS
  Filled 2015-07-23 (×7): qty 0.4

## 2015-07-23 MED ORDER — HYDROCODONE-ACETAMINOPHEN 5-325 MG PO TABS
1.0000 | ORAL_TABLET | ORAL | Status: DC | PRN
  Administered 2015-07-24 – 2015-07-29 (×15): 2 via ORAL
  Filled 2015-07-23 (×6): qty 2
  Filled 2015-07-23: qty 1
  Filled 2015-07-23 (×10): qty 2

## 2015-07-23 MED ORDER — ACETAMINOPHEN 325 MG PO TABS
650.0000 mg | ORAL_TABLET | Freq: Four times a day (QID) | ORAL | Status: DC | PRN
  Administered 2015-07-25: 650 mg via ORAL
  Filled 2015-07-23: qty 2

## 2015-07-23 MED ORDER — ACETAMINOPHEN 650 MG RE SUPP: 650.0000 mg | Freq: Four times a day (QID) | RECTAL | Status: DC | PRN

## 2015-07-23 MED ORDER — PIPERACILLIN-TAZOBACTAM 3.375 G IVPB 30 MIN
3.3750 g | Freq: Once | INTRAVENOUS | Status: AC
  Administered 2015-07-23: 3.375 g via INTRAVENOUS
  Filled 2015-07-23: qty 50

## 2015-07-23 MED ORDER — VANCOMYCIN HCL 10 G IV SOLR
2000.0000 mg | Freq: Once | INTRAVENOUS | Status: AC
  Administered 2015-07-23: 2000 mg via INTRAVENOUS
  Filled 2015-07-23: qty 2000

## 2015-07-23 MED ORDER — POLYETHYLENE GLYCOL 3350 17 G PO PACK
17.0000 g | PACK | Freq: Every day | ORAL | Status: DC | PRN
  Administered 2015-07-28: 17 g via ORAL
  Filled 2015-07-23 (×2): qty 1

## 2015-07-23 MED ORDER — SODIUM CHLORIDE 0.9% FLUSH
3.0000 mL | Freq: Two times a day (BID) | INTRAVENOUS | Status: DC
  Administered 2015-07-23 – 2015-07-29 (×10): 3 mL via INTRAVENOUS

## 2015-07-23 MED ORDER — SODIUM CHLORIDE 0.9 % IV BOLUS (SEPSIS)
1000.0000 mL | Freq: Once | INTRAVENOUS | Status: AC
  Administered 2015-07-23: 1000 mL via INTRAVENOUS

## 2015-07-23 MED ORDER — HYDRALAZINE HCL 20 MG/ML IJ SOLN
10.0000 mg | Freq: Three times a day (TID) | INTRAMUSCULAR | Status: DC | PRN
Start: 2015-07-23 — End: 2015-07-29

## 2015-07-23 MED ORDER — SODIUM CHLORIDE 0.9 % IV SOLN
INTRAVENOUS | Status: AC
  Administered 2015-07-23 – 2015-07-24 (×2): via INTRAVENOUS

## 2015-07-23 MED ORDER — PIPERACILLIN-TAZOBACTAM 3.375 G IVPB
3.3750 g | Freq: Three times a day (TID) | INTRAVENOUS | Status: DC
  Administered 2015-07-24 – 2015-07-25 (×4): 3.375 g via INTRAVENOUS
  Filled 2015-07-23 (×6): qty 50

## 2015-07-23 MED ORDER — VANCOMYCIN HCL IN DEXTROSE 1-5 GM/200ML-% IV SOLN: 1000.0000 mg | Freq: Once | INTRAVENOUS | Status: DC

## 2015-07-23 MED ORDER — VANCOMYCIN HCL IN DEXTROSE 1-5 GM/200ML-% IV SOLN
1000.0000 mg | Freq: Three times a day (TID) | INTRAVENOUS | Status: DC
Start: 2015-07-24 — End: 2015-07-25
  Administered 2015-07-24 – 2015-07-25 (×6): 1000 mg via INTRAVENOUS
  Filled 2015-07-23 (×7): qty 200

## 2015-07-23 MED ORDER — AMLODIPINE BESYLATE 5 MG PO TABS
5.0000 mg | ORAL_TABLET | Freq: Every day | ORAL | Status: DC
  Administered 2015-07-24 – 2015-07-29 (×6): 5 mg via ORAL
  Filled 2015-07-23 (×8): qty 1

## 2015-07-23 MED ORDER — TRAZODONE HCL 50 MG PO TABS
25.0000 mg | ORAL_TABLET | Freq: Every evening | ORAL | Status: DC | PRN
  Filled 2015-07-23: qty 1

## 2015-07-23 MED ORDER — LISINOPRIL 20 MG PO TABS
20.0000 mg | ORAL_TABLET | Freq: Every day | ORAL | Status: DC
  Administered 2015-07-24 – 2015-07-29 (×6): 20 mg via ORAL
  Filled 2015-07-23 (×6): qty 1

## 2015-07-23 MED ORDER — SODIUM CHLORIDE 0.9 % IV BOLUS (SEPSIS)
500.0000 mL | Freq: Once | INTRAVENOUS | Status: AC
  Administered 2015-07-23: 500 mL via INTRAVENOUS

## 2015-07-23 MED ORDER — MORPHINE SULFATE (PF) 4 MG/ML IV SOLN
4.0000 mg | Freq: Once | INTRAVENOUS | Status: AC
  Administered 2015-07-23: 4 mg via INTRAVENOUS
  Filled 2015-07-23: qty 1

## 2015-07-23 NOTE — ED Notes (Signed)
Lt. Foot pedal pulse +by doppler.

## 2015-07-23 NOTE — ED Notes (Signed)
Pt provided with urinal to give urine specimen

## 2015-07-23 NOTE — H&P (Signed)
History and Physical    Danny Fuller [id number] DOB: [birth year] DOA: 07/23/2015  PCP: [name], MD  Patient coming from: home  Chief Complaint: "My foot just got real bad and swollen over the last 2 days."  HPI: Danny Fuller is a 40 y.o. male with medical history significant diabetes, hypertension, asthma, reflux and chronic headache presents to emergency room with chief complaint of left foot wound and foot swelling.Patient states having about a week ago he was trying to get a callus off of his foot  Using a callus shaving device. It appeared to work well.. A few days after doing this he noticed there was some weeping coming from the wound.  Patient tried a doctorate at home but this did not work. Patient noticed that he would have swelling in his foot during the day with lots of pain. Patient denies fevers but affirms chills. Patient denies any nausea vomiting.. Patient denies any rashes.  Patient has a recent tetanus shot.  ED Course: patient was found to be septic emergency room. He was bolused fluid and started on vancomycin and Zosyn.hospitalists were called for admission.  Review of Systems: As per HPI otherwise 10 point review of systems negative.    Past Medical History  Diagnosis Date  . Diabetes mellitus   . Hypertension   . Asthma   . GERD (gastroesophageal reflux disease)   . [id number])     Past Surgical History  Procedure Laterality Date  . Bilateral hip pins placed       reports that he has quit smoking. His smoking use included Cigarettes. He has never used smokeless tobacco. He reports that he drinks alcohol. He reports that he does not use illicit drugs.  No Known Allergies  Family History  Problem Relation Age of Onset  . Arthritis    . Diabetes    . Hypertension    . Hyperlipidemia    . Stroke    . Sudden death       Prior to Admission medications   Medication Sig Start Date End Date Taking? Authorizing Provider  amLODipine  (NORVASC) 5 MG tablet Take 1 tablet (5 mg total) by mouth daily. 03/26/15  Yes [name], MD  glipiZIDE (GLUCOTROL) 10 MG tablet Take 1 tablet (10 mg total) by mouth 2 (two) times daily before a meal. 03/26/15  Yes [name], MD  lisinopril (PRINIVIL,ZESTRIL) 20 MG tablet Take 1 tablet (20 mg total) by mouth daily. 03/26/15  Yes [name], MD  metFORMIN (GLUCOPHAGE) 1000 MG tablet Take 1 tablet (1,000 mg total) by mouth 2 (two) times daily with a meal. 03/26/15  Yes [name], MD  methocarbamol (ROBAXIN) 500 MG tablet Take 1 tablet (500 mg total) by mouth 2 (two) times daily. Patient not taking: Reported on 07/23/2015 06/13/15   Montine Circle, PA-C  sildenafil (VIAGRA) 100 MG tablet Take 1 tablet (100 mg total) by mouth daily as needed for erectile dysfunction. Patient not taking: Reported on 06/13/2015 04/23/15   [name], MD  tadalafil (CIALIS) 20 MG tablet Take 1 tablet (20 mg total) by mouth daily as needed for erectile dysfunction. Patient not taking: Reported on 06/13/2015 04/27/15   [name], MD    Physical Exam:  Constitutional: NAD, calm, comfortable Filed Vitals:   07/23/15 1930 07/23/15 1945 07/23/15 2000 07/23/15 2102  BP: 162/95 150/85 162/94 151/91  Pulse: 105 106 106 103  Temp:      TempSrc:  Resp: 23 21 25 19   Height:      Weight:      SpO2: 99% 91% 99% 99%   Eyes: PERRL, lids and conjunctivae normal ENMT: Mucous membranes are moist. Posterior pharynx clear of any exudate or lesions.Normal dentition.  Neck: normal, supple, no masses, no thyromegaly Respiratory: clear to auscultation bilaterally, no wheezing, no crackles. Normal respiratory effort. No accessory muscle use.  Cardiovascular: tachycardia rate regular rhythm, no murmurs / rubs / gallops. No extremity edema. 2+ pedal pulses. No carotid bruits.  Abdomen: no tenderness, no masses palpated. No hepatosplenomegaly. Bowel sounds positive.  Musculoskeletal: no clubbing / cyanosis. No joint  deformity upper and lower extremities. Good ROM, no contractures. Normal muscle tone.  Skin: lateral distal plantar ulceration. Minimal weeping that is clear. Appears to be very superficial. Foul odor. Erythema on the dorsum of the foot.  Foot is fairly indurated. Patient has good sensation in the left foot. Neurologic: CN 2-12 grossly intact. Sensation intact, DTR normal. Strength 5/5 in all 4.  Psychiatric: Normal judgment and insight. Alert and oriented x 3. Normal mood.    Labs on Admission: I have personally reviewed following labs and imaging studies  CBC:  Recent Labs Lab 07/23/15 1731  WBC 17.6*  NEUTROABS 13.8*  HGB 14.2  HCT 39.9  MCV 84.7  PLT [id number]   Basic Metabolic Panel:  Recent Labs Lab 07/23/15 1731  NA 128*  K 4.0  CL 93*  CO2 21*  GLUCOSE 443*  BUN 10  CREATININE 0.95  CALCIUM 9.6   GFR: Estimated Creatinine Clearance: 123.9 mL/min (by C-G formula based on Cr of 0.95). Liver Function Tests:  Recent Labs Lab 07/23/15 1731  AST 18  ALT 14*  ALKPHOS 99  BILITOT 1.2  PROT 7.9  ALBUMIN 3.7   No results for input(s): LIPASE, AMYLASE in the last 168 hours. No results for input(s): AMMONIA in the last 168 hours. Coagulation Profile: No results for input(s): INR, PROTIME in the last 168 hours. Cardiac Enzymes: No results for input(s): CKTOTAL, CKMB, CKMBINDEX, TROPONINI in the last 168 hours. BNP (last 3 results) No results for input(s): PROBNP in the last 8760 hours. HbA1C: No results for input(s): HGBA1C in the last 72 hours. CBG: No results for input(s): GLUCAP in the last 168 hours. Lipid Profile: No results for input(s): CHOL, HDL, LDLCALC, TRIG, CHOLHDL, LDLDIRECT in the last 72 hours. Thyroid Function Tests: No results for input(s): TSH, T4TOTAL, FREET4, T3FREE, THYROIDAB in the last 72 hours. Anemia Panel: No results for input(s): VITAMINB12, FOLATE, FERRITIN, TIBC, IRON, RETICCTPCT in the last 72 hours. Urine analysis:    Component  Value Date/Time   COLORURINE YELLOW 07/23/2015 1926   APPEARANCEUR CLEAR 07/23/2015 1926   LABSPEC 1.045* 07/23/2015 1926   PHURINE 5.5 07/23/2015 1926   GLUCOSEU >1000* 07/23/2015 1926   HGBUR TRACE* 07/23/2015 1926   HGBUR negative 04/17/2008 0929   BILIRUBINUR NEGATIVE 07/23/2015 1926   BILIRUBINUR n 04/23/2013 1350   KETONESUR 15* 07/23/2015 1926   PROTEINUR 30* 07/23/2015 1926   PROTEINUR trace 04/23/2013 1350   UROBILINOGEN 0.2 04/23/2013 1350   UROBILINOGEN 4.0* 08/09/2009 0938   NITRITE NEGATIVE 07/23/2015 1926   NITRITE n 04/23/2013 1350   LEUKOCYTESUR NEGATIVE 07/23/2015 1926   Sepsis Labs: !!!!!!!!!!!!!!!!!!!!!!!!!!!!!!!!!!!!!!!!!!!! @LABRCNTIP (procalcitonin:4,lacticidven:4) )No results found for this or any previous visit (from the past 240 hour(s)).   Radiological Exams on Admission: Dg Foot Complete Left  07/23/2015  CLINICAL DATA:  Left foot pain edema redness wound lateral plantar  surface 2 weeks EXAM: LEFT FOOT - COMPLETE 3+ VIEW COMPARISON:  None FINDINGS: No fracture or dislocation. Moderate soft tissue swelling laterally over the mid to distal fifth metatarsal and metatarsophalangeal joint with mild soft tissue head origin 80 suggesting edematous change. No underlying periosteal reaction or cortical destruction. Mild diffuse small vessel calcification. Small heel spur. IMPRESSION: Nonspecific soft tissue changes suggesting cellulitis. No definite evidence of osteomyelitis. Electronically Signed   By: Skipper Cliche M.D.   On: 07/23/2015 20:39    EKG: pending  Assessment/Plan Principal Problem:   Diabetic foot ulcer ([place]) Active Problems:   Diabetes mellitus without complication ([facility])   Essential hypertension   GERD   Asthma   Chronic headache   Cellulitis   Sepsis ([place])   Hyponatremia  Diabetic foot ulcer/sepsis Vancomycin and Zosyn started in emergency room Imaging consistent with cellulitis no evidence of osteomyelitis Patient may benefit from  MRI Blood culture sent  2 Initial lactic acid 2.54r repeat lactic acid less than 2  Hyponatremia Partially  Due to losses from sepsis and partially pseudohyponatremia from elevated blood glucose We'll recheck in a.m., patient has received generous fluids  Diabetes Sliding scale insulin Holding glipizide Holding metformin checking an A1c  Hypertension When necessary hydralazine 10 mg IV as needed for severe blood pressure Continuing outpatient 20 mg by mouth daily lisinopril  Reflux When necessary Pepcid  Asthma Albuterol prn  Chronic HA Prn tylenol   DVT prophylaxis: Lovenox  Code Status: full code  Family Communication: wife at bedside  Disposition Plan: home  Consults called: none Admission status: inpatient telemetry    Elwin Mocha MD [facility] Pager [phone]  If 7PM-7AM, please contact night-coverage [email or web address] Password [facility]  07/23/2015, 9:57 PM

## 2015-07-23 NOTE — ED Notes (Addendum)
Lt. Foot is red, swollen and warm to touch. Pt. Also has a  Low grade fever  Pt. Is a diabetic and was cleaning the bottom of the foot, he developed sore under his lt. 5th toe.  He works full time and has been unable to rest the foot.  Pt. Has not been feeling well, very tired and weak.

## 2015-07-23 NOTE — Progress Notes (Signed)
Pharmacy Antibiotic Note  Danny Fuller is a 40 y.o. male admitted on 07/23/2015 with cellulitis.  Pharmacy has been consulted for vancomycin and zosyn dosing. Patient received Vanc 2g*1 in the ED and zosyn 3.375g *1 in the ED.  Plan: Vancomycin 1000 IV every 8 hours.  Goal trough 10-15 mcg/mL. Zosyn 3.375g IV q8h (4 hour infusion).  Daily CBC and follow culture results and renal function for antibiotic adjustment  Height: 5\' 9"  (175.3 cm) Weight: 224 lb (101.606 kg) IBW/kg (Calculated) : 70.7  Temp (24hrs), Avg:99.3 F (37.4 C), Min:99.3 F (37.4 C), Max:99.3 F (37.4 C)   Recent Labs Lab 07/23/15 1731 07/23/15 1743 07/23/15 1843  WBC 17.6*  --   --   CREATININE 0.95  --   --   LATICACIDVEN  --  2.54* 1.96    Estimated Creatinine Clearance: 123.9 mL/min (by C-G formula based on Cr of 0.95).    No Known Allergies  Antimicrobials this admission: 6/9 vanc >>  6/9 zosyn >>   Microbiology results: 6/9 BCx:   Thank you for allowing pharmacy to be a part of this patient's care.  Melburn Popper, PharmD Clinical Pharmacy Resident Pager: [phone] 07/23/2015 6:57 PM

## 2015-07-23 NOTE — ED Provider Notes (Signed)
CSN: [id number]     Arrival date & time 07/23/15  1645 History   First MD Initiated Contact with Patient 07/23/15 1758     Chief Complaint  Patient presents with  . Foot Pain     (Consider location/radiation/quality/duration/timing/severity/associated sxs/prior Treatment) HPI Comments: Patient presents to the emergency department with chief complaint of left foot pain and swelling. He reports a history of hypertension and diabetes. States that he was picking a callus off of the bottom of his left foot and accidentally stabbed himself ball of the foot with a tool he was using to clean the callus. He states that he has had slowly worsening pain and swelling to the left foot. It is worsened with movement and palpation. He denies any fevers or chills, but states that he has been constantly taking ibuprofen. He denies any allergies to any medications. Denies any other associated symptoms.  The history is provided by the patient. No language interpreter was used.    Past Medical History  Diagnosis Date  . Diabetes mellitus   . Hypertension   . Asthma   . GERD (gastroesophageal reflux disease)   . [id number])    Past Surgical History  Procedure Laterality Date  . Bilateral hip pins placed     Family History  Problem Relation Age of Onset  . Arthritis    . Diabetes    . Hypertension    . Hyperlipidemia    . Stroke    . Sudden death     Social History  Substance Use Topics  . Smoking status: Former Smoker    Types: Cigarettes  . Smokeless tobacco: Never Used     Comment: quit 5 days ago  . Alcohol Use: 0.0 oz/week    0 Standard drinks or equivalent per week     Comment: once or twice a month    Review of Systems  Constitutional: Negative for fever and chills.  Respiratory: Negative for shortness of breath.   Cardiovascular: Negative for chest pain.  Gastrointestinal: Negative for nausea, vomiting, diarrhea and constipation.  Genitourinary: Negative for dysuria.  Skin:  Positive for color change and wound.  All other systems reviewed and are negative.     Allergies  Review of patient's allergies indicates no known allergies.  Home Medications   Prior to Admission medications   Medication Sig Start Date End Date Taking? Authorizing Provider  amLODipine (NORVASC) 5 MG tablet Take 1 tablet (5 mg total) by mouth daily. 03/26/15   [name], MD  glipiZIDE (GLUCOTROL) 10 MG tablet Take 1 tablet (10 mg total) by mouth 2 (two) times daily before a meal. 03/26/15   [name], MD  lisinopril (PRINIVIL,ZESTRIL) 20 MG tablet Take 1 tablet (20 mg total) by mouth daily. 03/26/15   [name], MD  metFORMIN (GLUCOPHAGE) 1000 MG tablet Take 1 tablet (1,000 mg total) by mouth 2 (two) times daily with a meal. 03/26/15   [name], MD  methocarbamol (ROBAXIN) 500 MG tablet Take 1 tablet (500 mg total) by mouth 2 (two) times daily. 06/13/15   Montine Circle, PA-C  sildenafil (VIAGRA) 100 MG tablet Take 1 tablet (100 mg total) by mouth daily as needed for erectile dysfunction. Patient not taking: Reported on 06/13/2015 04/23/15   [name], MD  tadalafil (CIALIS) 20 MG tablet Take 1 tablet (20 mg total) by mouth daily as needed for erectile dysfunction. Patient not taking: Reported on 06/13/2015 04/27/15   [name], MD  BP 143/87 mmHg  Pulse 125  Temp(Src) 99.3 F (37.4 C) (Oral)  Ht 5\' 9"  (1.753 m)  Wt 101.606 kg  BMI 33.06 kg/m2  SpO2 98% Physical Exam  Constitutional: He is oriented to person, place, and time. He appears well-developed and well-nourished.  HENT:  Head: Normocephalic and atraumatic.  Eyes: Conjunctivae and EOM are normal. Pupils are equal, round, and reactive to light. Right eye exhibits no discharge. Left eye exhibits no discharge. No scleral icterus.  Neck: Normal range of motion. Neck supple. No JVD present.  Cardiovascular: Regular rhythm and normal heart sounds.  Exam reveals no gallop and no friction rub.   No murmur  heard. Tachycardic  Pulmonary/Chest: Effort normal and breath sounds normal. No respiratory distress. He has no wheezes. He has no rales. He exhibits no tenderness.  Abdominal: Soft. He exhibits no distension and no mass. There is no tenderness. There is no rebound and no guarding.  Musculoskeletal: Normal range of motion. He exhibits no edema or tenderness.  Neurological: He is alert and oriented to person, place, and time.  Skin: Skin is warm and dry.  Left foot is erythematous, with small ulceration on ball of foot near the fifth toe, no evidence of abscess or foreign body  Psychiatric: He has a normal mood and affect. His behavior is normal. Judgment and thought content normal.  Nursing note and vitals reviewed.   ED Course  Procedures (including critical care time) Results for orders placed or performed during the hospital encounter of 07/23/15  Comprehensive metabolic panel  Result Value Ref Range   Sodium 128 (L) 135 - 145 mmol/L   Potassium 4.0 3.5 - 5.1 mmol/L   Chloride 93 (L) 101 - 111 mmol/L   CO2 21 (L) 22 - 32 mmol/L   Glucose, Bld 443 (H) 65 - 99 mg/dL   BUN 10 6 - 20 mg/dL   Creatinine, Ser 0.95 0.61 - 1.24 mg/dL   Calcium 9.6 8.9 - 10.3 mg/dL   Total Protein 7.9 6.5 - 8.1 g/dL   Albumin 3.7 3.5 - 5.0 g/dL   AST 18 15 - 41 U/L   ALT 14 (L) 17 - 63 U/L   Alkaline Phosphatase 99 38 - 126 U/L   Total Bilirubin 1.2 0.3 - 1.2 mg/dL   GFR calc non Af Amer >60 >60 mL/min   GFR calc Af Amer >60 >60 mL/min   Anion gap 14 5 - 15  CBC with Differential  Result Value Ref Range   WBC 17.6 (H) 4.0 - 10.5 K/uL   RBC 4.71 4.22 - 5.81 MIL/uL   Hemoglobin 14.2 13.0 - 17.0 g/dL   HCT 39.9 39.0 - 52.0 %   MCV 84.7 78.0 - 100.0 fL   MCH 30.1 26.0 - 34.0 pg   MCHC 35.6 30.0 - 36.0 g/dL   RDW 11.8 11.5 - 15.5 %   Platelets 330 150 - 400 K/uL   Neutrophils Relative % 79 %   Neutro Abs 13.8 (H) 1.7 - 7.7 K/uL   Lymphocytes Relative 14 %   Lymphs Abs 2.5 0.7 - 4.0 K/uL    Monocytes Relative 7 %   Monocytes Absolute 1.3 (H) 0.1 - 1.0 K/uL   Eosinophils Relative 0 %   Eosinophils Absolute 0.0 0.0 - 0.7 K/uL   Basophils Relative 0 %   Basophils Absolute 0.0 0.0 - 0.1 K/uL  Urinalysis, Routine w reflex microscopic  Result Value Ref Range   Color, Urine YELLOW YELLOW   APPearance  CLEAR CLEAR   Specific Gravity, Urine 1.045 (H) 1.005 - 1.030   pH 5.5 5.0 - 8.0   Glucose, UA >1000 (A) NEGATIVE mg/dL   Hgb urine dipstick TRACE (A) NEGATIVE   Bilirubin Urine NEGATIVE NEGATIVE   Ketones, ur 15 (A) NEGATIVE mg/dL   Protein, ur 30 (A) NEGATIVE mg/dL   Nitrite NEGATIVE NEGATIVE   Leukocytes, UA NEGATIVE NEGATIVE  Urine microscopic-add on  Result Value Ref Range   Squamous Epithelial / LPF 0-5 (A) NONE SEEN   WBC, UA 0-5 0 - 5 WBC/hpf   RBC / HPF 0-5 0 - 5 RBC/hpf   Bacteria, UA NONE SEEN NONE SEEN  I-Stat CG4 Lactic Acid, ED  Result Value Ref Range   Lactic Acid, Venous 2.54 (HH) 0.5 - 2.0 mmol/L   Comment NOTIFIED PHYSICIAN   I-Stat CG4 Lactic Acid, ED  (not at  [facility])  Result Value Ref Range   Lactic Acid, Venous 1.96 0.5 - 2.0 mmol/L  I-Stat CG4 Lactic Acid, ED  Result Value Ref Range   Lactic Acid, Venous 1.18 0.5 - 2.0 mmol/L   Dg Foot Complete Left  07/23/2015  CLINICAL DATA:  Left foot pain edema redness wound lateral plantar surface 2 weeks EXAM: LEFT FOOT - COMPLETE 3+ VIEW COMPARISON:  None FINDINGS: No fracture or dislocation. Moderate soft tissue swelling laterally over the mid to distal fifth metatarsal and metatarsophalangeal joint with mild soft tissue head origin 80 suggesting edematous change. No underlying periosteal reaction or cortical destruction. Mild diffuse small vessel calcification. Small heel spur. IMPRESSION: Nonspecific soft tissue changes suggesting cellulitis. No definite evidence of osteomyelitis. Electronically Signed   By: Skipper Cliche M.D.   On: 07/23/2015 20:39    I have personally reviewed and evaluated these images and  lab results as part of my medical decision-making.    MDM   Final diagnoses:  Cellulitis of left lower extremity  Hyperglycemia  Sepsis, due to unspecified organism [facility])    Patient with swollen and erythematous left foot. Appears consistent with cellulitis. He is diabetic.  Meet sepsis criteria.  Leukocytosis to 17.6. Mildly hyponatremic to 128. Glucose is 443. Lactic acid 2.54. Will give weight-based fluid resuscitation, and start empiric antibiotics.  Patient is up-to-date on his tetanus shot.  Appreciate hospitalist team for admitting the patient.    Medications  piperacillin-tazobactam (ZOSYN) IVPB 3.375 g (not administered)  vancomycin (VANCOCIN) IVPB 1000 mg/200 mL premix (not administered)  piperacillin-tazobactam (ZOSYN) IVPB 3.375 g (0 g Intravenous Stopped 07/23/15 1911)  sodium chloride 0.9 % bolus 1,000 mL (0 mLs Intravenous Stopped 07/23/15 1904)    And  sodium chloride 0.9 % bolus 1,000 mL (0 mLs Intravenous Stopped 07/23/15 1911)    And  sodium chloride 0.9 % bolus 1,000 mL (0 mLs Intravenous Stopped 07/23/15 1951)    And  sodium chloride 0.9 % bolus 500 mL (0 mLs Intravenous Stopped 07/23/15 1937)  vancomycin (VANCOCIN) 2,000 mg in sodium chloride 0.9 % 500 mL IVPB (2,000 mg Intravenous New Bag/Given 07/23/15 1841)  morphine 4 MG/ML injection 4 mg (4 mg Intravenous Given 07/23/15 2020)   CRITICAL CARE Performed by: Montine Circle   Total critical care time: 35 minutes  Critical care time was exclusive of separately billable procedures and treating other patients.  Critical care was necessary to treat or prevent imminent or life-threatening deterioration.  Critical care was time spent personally by me on the following activities: development of treatment plan with patient and/or surrogate as well as nursing,  discussions with consultants, evaluation of patient's response to treatment, examination of patient, obtaining history from patient or surrogate, ordering and  performing treatments and interventions, ordering and review of laboratory studies, ordering and review of radiographic studies, pulse oximetry and re-evaluation of patient's condition.     Montine Circle, PA-C 07/23/15 2151  Daleen Bo, MD 07/24/15 [phone]

## 2015-07-24 ENCOUNTER — Inpatient Hospital Stay (HOSPITAL_COMMUNITY): Payer: BC Managed Care – PPO

## 2015-07-24 DIAGNOSIS — E871 Hypo-osmolality and hyponatremia: Secondary | ICD-10-CM | POA: Diagnosis present

## 2015-07-24 DIAGNOSIS — L97509 Non-pressure chronic ulcer of other part of unspecified foot with unspecified severity: Secondary | ICD-10-CM

## 2015-07-24 DIAGNOSIS — E11621 Type 2 diabetes mellitus with foot ulcer: Secondary | ICD-10-CM | POA: Diagnosis present

## 2015-07-24 LAB — CBC
HEMATOCRIT: 34.2 % — AB (ref 39.0–52.0)
HEMOGLOBIN: 11.7 g/dL — AB (ref 13.0–17.0)
MCH: 29.8 pg (ref 26.0–34.0)
MCHC: 34.2 g/dL (ref 30.0–36.0)
MCV: 87.2 fL (ref 78.0–100.0)
Platelets: 302 10*3/uL (ref 150–400)
RBC: 3.92 MIL/uL — ABNORMAL LOW (ref 4.22–5.81)
RDW: 12.3 % (ref 11.5–15.5)
WBC: 16.7 10*3/uL — ABNORMAL HIGH (ref 4.0–10.5)

## 2015-07-24 LAB — BASIC METABOLIC PANEL
Anion gap: 9 (ref 5–15)
BUN: 8 mg/dL (ref 6–20)
CHLORIDE: 100 mmol/L — AB (ref 101–111)
CO2: 23 mmol/L (ref 22–32)
Calcium: 8.4 mg/dL — ABNORMAL LOW (ref 8.9–10.3)
Creatinine, Ser: 0.76 mg/dL (ref 0.61–1.24)
GFR calc Af Amer: >60 mL/min (ref >60)
GLUCOSE: 349 mg/dL — AB (ref 65–99)
POTASSIUM: 4.2 mmol/L (ref 3.5–5.1)
Sodium: 132 mmol/L — ABNORMAL LOW (ref 135–145)

## 2015-07-24 LAB — GLUCOSE, CAPILLARY
GLUCOSE-CAPILLARY: 351 mg/dL — AB (ref 65–99)
Glucose-Capillary: 299 mg/dL — ABNORMAL HIGH (ref 65–99)
Glucose-Capillary: 313 mg/dL — ABNORMAL HIGH (ref 65–99)

## 2015-07-24 LAB — URINE CULTURE: Culture: NO GROWTH

## 2015-07-24 MED ORDER — FAMOTIDINE 20 MG PO TABS: 10.0000 mg | ORAL_TABLET | Freq: Every day | ORAL | Status: DC | PRN

## 2015-07-24 MED ORDER — INSULIN ASPART 100 UNIT/ML ~~LOC~~ SOLN
0.0000 [IU] | Freq: Every day | SUBCUTANEOUS | Status: DC
  Administered 2015-07-24: 3 [IU] via SUBCUTANEOUS
  Administered 2015-07-25: 4 [IU] via SUBCUTANEOUS
  Administered 2015-07-27: 2 [IU] via SUBCUTANEOUS

## 2015-07-24 MED ORDER — ALBUTEROL SULFATE (2.5 MG/3ML) 0.083% IN NEBU
3.0000 mL | INHALATION_SOLUTION | Freq: Four times a day (QID) | RESPIRATORY_TRACT | Status: DC | PRN
Start: 2015-07-24 — End: 2015-07-29

## 2015-07-24 MED ORDER — GADOBENATE DIMEGLUMINE 529 MG/ML IV SOLN
20.0000 mL | Freq: Once | INTRAVENOUS | Status: AC | PRN
  Administered 2015-07-24: 20 mL via INTRAVENOUS

## 2015-07-24 MED ORDER — INSULIN GLARGINE 100 UNIT/ML ~~LOC~~ SOLN
5.0000 [IU] | SUBCUTANEOUS | Status: DC
  Administered 2015-07-24: 5 [IU] via SUBCUTANEOUS
  Filled 2015-07-24 (×2): qty 0.05

## 2015-07-24 MED ORDER — INSULIN ASPART 100 UNIT/ML ~~LOC~~ SOLN
0.0000 [IU] | Freq: Three times a day (TID) | SUBCUTANEOUS | Status: DC
Start: 2015-07-24 — End: 2015-07-27
  Administered 2015-07-24: 9 [IU] via SUBCUTANEOUS
  Administered 2015-07-25 (×3): 5 [IU] via SUBCUTANEOUS
  Administered 2015-07-26 (×2): 3 [IU] via SUBCUTANEOUS
  Administered 2015-07-26: 2 [IU] via SUBCUTANEOUS
  Administered 2015-07-27: 3 [IU] via SUBCUTANEOUS
  Administered 2015-07-27: 5 [IU] via SUBCUTANEOUS

## 2015-07-24 NOTE — Progress Notes (Signed)
PROGRESS NOTE                                                                                                                                                                                                             Patient Demographics:    Danny Fuller, is a 40 y.o. male, DOB - 12-31-1975, [id number]  Admit date - 07/23/2015   Admitting Physician Elwin Mocha, MD  Outpatient Primary MD for the patient is [name], MD  LOS - 1  Chief Complaint  Patient presents with  . Foot Pain       Brief Narrative   40 y.o. male with medical history significant diabetes, hypertension, asthma, reflux and chronic headache presents infected left foot diabetic ulcer.  Subjective:    Danny Fuller today has, No headache, No chest pain, No abdominal pain , Reports left foot pain significantly subsided.  Assessment  & Plan :    Principal Problem:   Diabetic foot ulcer ([place]) Active Problems:   Diabetes mellitus without complication ([place])   Essential hypertension   GERD   Asthma   Chronic headache   Cellulitis   Sepsis ([place])   Hyponatremia  Infected diabetic foot ulcer - Afebrile, but significant leukocytosis - X-ray with no evidence of myelitis, but with significant swelling and erythema, will obtain MRI of foot to rule out osteomyelitis - Continue with broad-spectrum antibiotics vancomycin and Zosyn, follow on blood cultures  Hyponatremia/pseudohyponatremia - This is pseudohyponatremia secondary to hyperglycemia, corrected would be 136  Diabetes mellitus - Continue with insulin sliding scale, hold glipizide and [place], will start on low dose Lantus  Hypertension - Continue with lisinopril and amlodipine  Asthma - When necessary nebs  Code Status : Full  Family Communication  : none at bedside  Disposition Plan  : Home in 2-3 days.  Consults  :  none  Procedures  : none  DVT Prophylaxis  :  Lovenox  Lab Results   Component Value Date   PLT 302 07/24/2015    Antibiotics  :   Anti-infectives    Start     Dose/Rate Route Frequency Ordered Stop   07/24/15 0200  piperacillin-tazobactam (ZOSYN) IVPB 3.375 g     3.375 g 12.5 mL/hr over 240 Minutes Intravenous Every 8 hours 07/23/15 1853     07/24/15 0200  vancomycin (VANCOCIN)  IVPB 1000 mg/200 mL premix     1,000 mg 200 mL/hr over 60 Minutes Intravenous Every 8 hours 07/23/15 1853     07/23/15 1845  vancomycin (VANCOCIN) 2,000 mg in sodium chloride 0.9 % 500 mL IVPB     2,000 mg 250 mL/hr over 120 Minutes Intravenous  Once 07/23/15 1834 07/23/15 2041   07/23/15 1815  piperacillin-tazobactam (ZOSYN) IVPB 3.375 g     3.375 g 100 mL/hr over 30 Minutes Intravenous  Once 07/23/15 1810 07/23/15 1911   07/23/15 1815  vancomycin (VANCOCIN) IVPB 1000 mg/200 mL premix  Status:  Discontinued     1,000 mg 200 mL/hr over 60 Minutes Intravenous  Once 07/23/15 1810 07/23/15 1834        Objective:   Filed Vitals:   07/23/15 2245 07/23/15 2332 07/24/15 0534 07/24/15 0859  BP: 141/76 160/89 116/65 142/81  Pulse: 101 105 85 92  Temp:  98.6 F (37 C) 98.2 F (36.8 C) 98.1 F (36.7 C)  TempSrc:  Oral Oral Oral  Resp: 17 18 20 18   Height:  5\' 10"  (1.778 m)    Weight:  97.523 kg (215 lb)    SpO2: 97% 99% 100% 100%    Wt Readings from Last 3 Encounters:  07/23/15 97.523 kg (215 lb)  04/23/15 100.699 kg (222 lb)  03/26/15 101.606 kg (224 lb)     Intake/Output Summary (Last 24 hours) at 07/24/15 1346 Last data filed at 07/24/15 0859  Gross per 24 hour  Intake   5677 ml  Output   1600 ml  Net   4077 ml     Physical Exam  Awake Alert, Oriented X 3, No new F.N deficits, Normal affect [place].AT,PERRAL Supple Neck,No JVD, No cervical lymphadenopathy appriciated.  Symmetrical Chest wall movement, Good air movement bilaterally, CTAB RRR,No Gallops,Rubs or new Murmurs, No Parasternal Heave +ve B.Sounds, Abd Soft, No tenderness, No organomegaly  appriciated, No rebound - guarding or rigidity. No Cyanosis, Clubbing , Lateral distal plantar ulceration, with minimal discharge, left foot swollen with mild erythema    Data Review:    CBC  Recent Labs Lab 07/23/15 1731 07/24/15 0433  WBC 17.6* 16.7*  HGB 14.2 11.7*  HCT 39.9 34.2*  PLT 330 302  MCV 84.7 87.2  MCH 30.1 29.8  MCHC 35.6 34.2  RDW 11.8 12.3  LYMPHSABS 2.5  --   MONOABS 1.3*  --   EOSABS 0.0  --   BASOSABS 0.0  --     Chemistries   Recent Labs Lab 07/23/15 1731 07/24/15 0433  NA 128* 132*  K 4.0 4.2  CL 93* 100*  CO2 21* 23  GLUCOSE 443* 349*  BUN 10 8  CREATININE 0.95 0.76  CALCIUM 9.6 8.4*  AST 18  --   ALT 14*  --   ALKPHOS 99  --   BILITOT 1.2  --    ------------------------------------------------------------------------------------------------------------------ No results for input(s): CHOL, HDL, LDLCALC, TRIG, CHOLHDL, LDLDIRECT in the last 72 hours.  Lab Results  Component Value Date   HGBA1C 12.9* 03/26/2015   ------------------------------------------------------------------------------------------------------------------ No results for input(s): TSH, T4TOTAL, T3FREE, THYROIDAB in the last 72 hours.  Invalid input(s): FREET3 ------------------------------------------------------------------------------------------------------------------ No results for input(s): VITAMINB12, FOLATE, FERRITIN, TIBC, IRON, RETICCTPCT in the last 72 hours.  Coagulation profile No results for input(s): INR, PROTIME in the last 168 hours.  No results for input(s): DDIMER in the last 72 hours.  Cardiac Enzymes No results for input(s): CKMB, TROPONINI, MYOGLOBIN in the last 168 hours.  Invalid input(s): CK ------------------------------------------------------------------------------------------------------------------ No results found for: BNP  Inpatient Medications  Scheduled Meds: . amLODipine  5 mg Oral Daily  . enoxaparin (LOVENOX)  injection  40 mg Subcutaneous Daily  . insulin aspart  0-5 Units Subcutaneous QHS  . insulin aspart  0-9 Units Subcutaneous TID WC  . lisinopril  20 mg Oral Daily  . piperacillin-tazobactam (ZOSYN)  IV  3.375 g Intravenous Q8H  . sodium chloride flush  3 mL Intravenous Q12H  . vancomycin  1,000 mg Intravenous Q8H   Continuous Infusions:  PRN Meds:.acetaminophen **OR** acetaminophen, albuterol, famotidine, hydrALAZINE, HYDROcodone-acetaminophen, polyethylene glycol, traZODone  Micro Results No results found for this or any previous visit (from the past 240 hour(s)).  Radiology Reports Dg Foot Complete Left  07/23/2015  CLINICAL DATA:  Left foot pain edema redness wound lateral plantar surface 2 weeks EXAM: LEFT FOOT - COMPLETE 3+ VIEW COMPARISON:  None FINDINGS: No fracture or dislocation. Moderate soft tissue swelling laterally over the mid to distal fifth metatarsal and metatarsophalangeal joint with mild soft tissue head origin 80 suggesting edematous change. No underlying periosteal reaction or cortical destruction. Mild diffuse small vessel calcification. Small heel spur. IMPRESSION: Nonspecific soft tissue changes suggesting cellulitis. No definite evidence of osteomyelitis. Electronically Signed   By: Skipper Cliche M.D.   On: 07/23/2015 20:39     Akeya Ryther M.D on 07/24/2015 at 1:46 PM  Between 7am to 7pm - Pager - [phone]  After 7pm go to [email or web address] - password [facility]  [facility] -  Office  [phone]

## 2015-07-24 NOTE — Progress Notes (Signed)
Patient arrived on unit via stretcher. Patient alert and oriented x4. Patient oriented to room, unit and staff. Patient's wife at the bedside. Patient placed on telemetry, [place] notified. Skin assessment completed with Kathyrn Drown, check flowsheets. Patient rates pain 9/10, pain medication administered. Safety Fall Prevention Plan was discussed with patient. Orders have been reviewed and implemented. Will continue to monitor the patient. Call light has been placed within reach.  Nena Polio BSN, RN  Phone Number: [phone]

## 2015-07-25 DIAGNOSIS — E871 Hypo-osmolality and hyponatremia: Secondary | ICD-10-CM

## 2015-07-25 DIAGNOSIS — K0889 Other specified disorders of teeth and supporting structures: Secondary | ICD-10-CM

## 2015-07-25 DIAGNOSIS — L97529 Non-pressure chronic ulcer of other part of left foot with unspecified severity: Secondary | ICD-10-CM

## 2015-07-25 DIAGNOSIS — E11621 Type 2 diabetes mellitus with foot ulcer: Secondary | ICD-10-CM

## 2015-07-25 DIAGNOSIS — E1169 Type 2 diabetes mellitus with other specified complication: Secondary | ICD-10-CM

## 2015-07-25 DIAGNOSIS — A419 Sepsis, unspecified organism: Principal | ICD-10-CM

## 2015-07-25 LAB — CBC
HEMATOCRIT: 33.4 % — AB (ref 39.0–52.0)
Hemoglobin: 11.2 g/dL — ABNORMAL LOW (ref 13.0–17.0)
MCH: 28.6 pg (ref 26.0–34.0)
MCHC: 33.5 g/dL (ref 30.0–36.0)
MCV: 85.4 fL (ref 78.0–100.0)
PLATELETS: 287 10*3/uL (ref 150–400)
RBC: 3.91 MIL/uL — ABNORMAL LOW (ref 4.22–5.81)
RDW: 12 % (ref 11.5–15.5)
WBC: 13.7 10*3/uL — ABNORMAL HIGH (ref 4.0–10.5)

## 2015-07-25 LAB — SEDIMENTATION RATE: SED RATE: 68 mm/h — AB (ref 0–16)

## 2015-07-25 LAB — BASIC METABOLIC PANEL
ANION GAP: 8 (ref 5–15)
BUN: 9 mg/dL (ref 6–20)
CALCIUM: 8.6 mg/dL — AB (ref 8.9–10.3)
CO2: 22 mmol/L (ref 22–32)
Chloride: 101 mmol/L (ref 101–111)
Creatinine, Ser: 0.61 mg/dL (ref 0.61–1.24)
GFR calc Af Amer: >60 mL/min (ref >60)
GLUCOSE: 332 mg/dL — AB (ref 65–99)
POTASSIUM: 3.8 mmol/L (ref 3.5–5.1)
Sodium: 131 mmol/L — ABNORMAL LOW (ref 135–145)

## 2015-07-25 LAB — GLUCOSE, CAPILLARY
GLUCOSE-CAPILLARY: 254 mg/dL — AB (ref 65–99)
Glucose-Capillary: 298 mg/dL — ABNORMAL HIGH (ref 65–99)
Glucose-Capillary: 299 mg/dL — ABNORMAL HIGH (ref 65–99)

## 2015-07-25 LAB — PREALBUMIN: Prealbumin: 8 mg/dL — ABNORMAL LOW (ref 18–38)

## 2015-07-25 LAB — C-REACTIVE PROTEIN: CRP: 19.7 mg/dL — ABNORMAL HIGH (ref <1.0)

## 2015-07-25 LAB — VANCOMYCIN, TROUGH

## 2015-07-25 MED ORDER — VANCOMYCIN HCL 10 G IV SOLR
1500.0000 mg | Freq: Three times a day (TID) | INTRAVENOUS | Status: DC
  Administered 2015-07-26 – 2015-07-29 (×11): 1500 mg via INTRAVENOUS
  Filled 2015-07-25 (×13): qty 1500

## 2015-07-25 MED ORDER — INSULIN GLARGINE 100 UNIT/ML ~~LOC~~ SOLN
12.0000 [IU] | SUBCUTANEOUS | Status: DC
Start: 2015-07-25 — End: 2015-07-25
  Filled 2015-07-25: qty 0.12

## 2015-07-25 MED ORDER — METRONIDAZOLE IN NACL 5-0.79 MG/ML-% IV SOLN
500.0000 mg | Freq: Three times a day (TID) | INTRAVENOUS | Status: DC
  Administered 2015-07-25 – 2015-07-29 (×13): 500 mg via INTRAVENOUS
  Filled 2015-07-25 (×12): qty 100

## 2015-07-25 MED ORDER — VANCOMYCIN HCL IN DEXTROSE 1-5 GM/200ML-% IV SOLN
1000.0000 mg | INTRAVENOUS | Status: DC
  Filled 2015-07-25: qty 200

## 2015-07-25 MED ORDER — VANCOMYCIN HCL IN DEXTROSE 1-5 GM/200ML-% IV SOLN
1000.0000 mg | INTRAVENOUS | Status: AC
  Administered 2015-07-25: 1000 mg via INTRAVENOUS
  Filled 2015-07-25: qty 200

## 2015-07-25 MED ORDER — VANCOMYCIN HCL 10 G IV SOLR
2000.0000 mg | INTRAVENOUS | Status: DC
  Filled 2015-07-25: qty 2000

## 2015-07-25 MED ORDER — INSULIN GLARGINE 100 UNIT/ML ~~LOC~~ SOLN: 10.0000 [IU] | SUBCUTANEOUS | Status: DC

## 2015-07-25 MED ORDER — INSULIN GLARGINE 100 UNIT/ML ~~LOC~~ SOLN
12.0000 [IU] | Freq: Every day | SUBCUTANEOUS | Status: DC
  Administered 2015-07-25 – 2015-07-27 (×3): 12 [IU] via SUBCUTANEOUS
  Filled 2015-07-25 (×3): qty 0.12

## 2015-07-25 MED ORDER — DEXTROSE 5 % IV SOLN
1.0000 g | Freq: Three times a day (TID) | INTRAVENOUS | Status: DC
  Administered 2015-07-25 – 2015-07-27 (×6): 1 g via INTRAVENOUS
  Filled 2015-07-25 (×8): qty 1

## 2015-07-25 NOTE — Progress Notes (Addendum)
Pharmacy Antibiotic Note  Danny Fuller is a 40 y.o. male admitted on 07/23/2015 with osteomyelitis.  Pharmacy has been consulted for vancomycin and zosyn dosing. A vancomycin trough was check today and is undetectable.   Plan: - Give additional 1gm vancomycin now then increase to 1500mg  IV Q8H - F/u renal fxn, C&S, clinical status and trough at SS  Height: 5\' 10"  (177.8 cm) Weight: 215 lb (97.523 kg) IBW/kg (Calculated) : 73  Temp (24hrs), Avg:98.9 F (37.2 C), Min:98 F (36.7 C), Max:100.1 F (37.8 C)   Recent Labs Lab 07/23/15 1731 07/23/15 1743 07/23/15 1843 07/23/15 2116 07/24/15 0433 07/25/15 0502 07/25/15 1750  WBC 17.6*  --   --   --  16.7* 13.7*  --   CREATININE 0.95  --   --   --  0.76 0.61  --   LATICACIDVEN  --  2.54* 1.96 1.18  --   --   --   VANCOTROUGH  --   --   --   --   --   --  <4*    Estimated Creatinine Clearance: 146.6 mL/min (by C-G formula based on Cr of 0.61).    No Known Allergies  Antimicrobials this admission: 6/9 vanc >>  6/9 zosyn >>   Microbiology results: 6/9 BCx:   Thank you for allowing pharmacy to be a part of this patient's care.  Salome Arnt, PharmD, BCPS Pager # [phone] 07/25/2015 7:14 PM

## 2015-07-25 NOTE — Progress Notes (Signed)
PROGRESS NOTE                                                                                                                                                                                                             Patient Demographics:    Danny Fuller, is a 40 y.o. male, DOB - 04-22-1975, [id number]  Admit date - 07/23/2015   Admitting Physician Elwin Mocha, MD  Outpatient Primary MD for the patient is [name], MD  LOS - 2  Chief Complaint  Patient presents with  . Foot Pain       Brief Narrative   40 y.o. male with medical history significant diabetes, hypertension, asthma, reflux and chronic headache presents infected left foot diabetic ulcer, MRI foot significant for osteomyelitis.  Subjective:    Danny Fuller today has, No headache, No chest pain, No abdominal pain , Reports left foot pain significantly subsided.  Assessment  & Plan :    Principal Problem:   Diabetic foot ulcer ([place]) Active Problems:   Diabetes mellitus without complication ([place])   Essential hypertension   GERD   Asthma   Chronic headache   Cellulitis   Sepsis ([place])   Hyponatremia  Infected diabetic foot ulcer - Afebrile, but significant leukocytosis - MRI foot significant for left foot fifth head metatarsal osteomyelitis. - ID consult greatly appreciated, antibiotics per ID, on  IV vancomycin, flagyl and cefepime, follow on wound swab. - orthopedic consult greatly appreciated, plan for fifth ray amputation in a.m. [facility] consult vascular surgery in a.m., follow on ABI  Hyponatremia/pseudohyponatremia - This is pseudohyponatremia secondary to hyperglycemia, corrected would be 136  Diabetes mellitu - Continue with insulin sliding scale, hold glipizide and Metformin , started on Lantus, we'll increase today from 5-12 given poorly controlled CBG, continue with insulin sliding scale.  Hypertension - Continue with lisinopril and  amlodipine  Asthma - When necessary nebs  Code Status : Full  Family Communication  : Cousin at bedside  Disposition Plan  : Pending further work up in surgery  Consults  : ID,orthopedic  Procedures  : none  DVT Prophylaxis  :  Lovenox  Lab Results  Component Value Date   PLT 287 07/25/2015    Antibiotics  :   Anti-infectives    Start     Dose/Rate Route Frequency Ordered Stop  07/25/15 1400  ceFEPIme (MAXIPIME) 1 g in dextrose 5 % 50 mL IVPB     1 g 100 mL/hr over 30 Minutes Intravenous Every 8 hours 07/25/15 1150     07/25/15 1200  metroNIDAZOLE (FLAGYL) IVPB 500 mg     500 mg 100 mL/hr over 60 Minutes Intravenous Every 8 hours 07/25/15 1150     07/24/15 0200  piperacillin-tazobactam (ZOSYN) IVPB 3.375 g  Status:  Discontinued     3.375 g 12.5 mL/hr over 240 Minutes Intravenous Every 8 hours 07/23/15 1853 07/25/15 1150   07/24/15 0200  vancomycin (VANCOCIN) IVPB 1000 mg/200 mL premix     1,000 mg 200 mL/hr over 60 Minutes Intravenous Every 8 hours 07/23/15 1853     07/23/15 1845  vancomycin (VANCOCIN) 2,000 mg in sodium chloride 0.9 % 500 mL IVPB     2,000 mg 250 mL/hr over 120 Minutes Intravenous  Once 07/23/15 1834 07/23/15 2041   07/23/15 1815  piperacillin-tazobactam (ZOSYN) IVPB 3.375 g     3.375 g 100 mL/hr over 30 Minutes Intravenous  Once 07/23/15 1810 07/23/15 1911   07/23/15 1815  vancomycin (VANCOCIN) IVPB 1000 mg/200 mL premix  Status:  Discontinued     1,000 mg 200 mL/hr over 60 Minutes Intravenous  Once 07/23/15 1810 07/23/15 1834        Objective:   Filed Vitals:   07/24/15 1725 07/24/15 2123 07/25/15 0454 07/25/15 0754  BP: 149/80 124/71 130/75 160/87  Pulse: 101 98 86 93  Temp: 99.9 F (37.7 C) 98.9 F (37.2 C) 98 F (36.7 C) 98.4 F (36.9 C)  TempSrc: Oral Oral Oral Oral  Resp: 19 18 16 18   Height:      Weight:      SpO2: 100% 99% 100% 100%    Wt Readings from Last 3 Encounters:  07/23/15 97.523 kg (215 lb)  04/23/15 100.699  kg (222 lb)  03/26/15 101.606 kg (224 lb)     Intake/Output Summary (Last 24 hours) at 07/25/15 1351 Last data filed at 07/25/15 1200  Gross per 24 hour  Intake    603 ml  Output   1300 ml  Net   -697 ml     Physical Exam  Awake Alert, Oriented X 3, No new F.N deficits, Normal affect [place].AT,PERRAL Supple Neck,No JVD, No cervical lymphadenopathy appriciated.  Symmetrical Chest wall movement, Good air movement bilaterally, CTAB RRR,No Gallops,Rubs or new Murmurs, No Parasternal Heave +ve B.Sounds, Abd Soft, No tenderness, No organomegaly appriciated, No rebound - guarding or rigidity. No Cyanosis, Clubbing , Lateral distal plantar ulceration, with minimal discharge, left foot swollen with mild erythema    Data Review:    CBC  Recent Labs Lab 07/23/15 1731 07/24/15 0433 07/25/15 0502  WBC 17.6* 16.7* 13.7*  HGB 14.2 11.7* 11.2*  HCT 39.9 34.2* 33.4*  PLT 330 302 287  MCV 84.7 87.2 85.4  MCH 30.1 29.8 28.6  MCHC 35.6 34.2 33.5  RDW 11.8 12.3 12.0  LYMPHSABS 2.5  --   --   MONOABS 1.3*  --   --   EOSABS 0.0  --   --   BASOSABS 0.0  --   --     Chemistries   Recent Labs Lab 07/23/15 1731 07/24/15 0433 07/25/15 0502  NA 128* 132* 131*  K 4.0 4.2 3.8  CL 93* 100* 101  CO2 21* 23 22  GLUCOSE 443* 349* 332*  BUN 10 8 9   CREATININE 0.95 0.76 0.61  CALCIUM 9.6 8.4*  8.6*  AST 18  --   --   ALT 14*  --   --   ALKPHOS 99  --   --   BILITOT 1.2  --   --    ------------------------------------------------------------------------------------------------------------------ No results for input(s): CHOL, HDL, LDLCALC, TRIG, CHOLHDL, LDLDIRECT in the last 72 hours.  Lab Results  Component Value Date   HGBA1C 12.9* 03/26/2015   ------------------------------------------------------------------------------------------------------------------ No results for input(s): TSH, T4TOTAL, T3FREE, THYROIDAB in the last 72 hours.  Invalid input(s):  FREET3 ------------------------------------------------------------------------------------------------------------------ No results for input(s): VITAMINB12, FOLATE, FERRITIN, TIBC, IRON, RETICCTPCT in the last 72 hours.  Coagulation profile No results for input(s): INR, PROTIME in the last 168 hours.  No results for input(s): DDIMER in the last 72 hours.  Cardiac Enzymes No results for input(s): CKMB, TROPONINI, MYOGLOBIN in the last 168 hours.  Invalid input(s): CK ------------------------------------------------------------------------------------------------------------------ No results found for: BNP  Inpatient Medications  Scheduled Meds: . amLODipine  5 mg Oral Daily  . ceFEPime (MAXIPIME) IV  1 g Intravenous Q8H  . enoxaparin (LOVENOX) injection  40 mg Subcutaneous Daily  . insulin aspart  0-5 Units Subcutaneous QHS  . insulin aspart  0-9 Units Subcutaneous TID WC  . insulin glargine  12 Units Subcutaneous Q24H  . lisinopril  20 mg Oral Daily  . metronidazole  500 mg Intravenous Q8H  . sodium chloride flush  3 mL Intravenous Q12H  . vancomycin  1,000 mg Intravenous Q8H   Continuous Infusions:  PRN Meds:.acetaminophen **OR** acetaminophen, albuterol, famotidine, hydrALAZINE, HYDROcodone-acetaminophen, polyethylene glycol, traZODone  Micro Results Recent Results (from the past 240 hour(s))  Culture, blood (Routine x 2)     Status: None (Preliminary result)   Collection Time: 07/23/15  5:32 PM  Result Value Ref Range Status   Specimen Description BLOOD RIGHT ANTECUBITAL  Final   Special Requests BOTTLES DRAWN AEROBIC AND ANAEROBIC 5CC  Final   Culture NO GROWTH 2 DAYS  Final   Report Status PENDING  Incomplete  Culture, blood (Routine x 2)     Status: None (Preliminary result)   Collection Time: 07/23/15  6:34 PM  Result Value Ref Range Status   Specimen Description BLOOD LEFT ANTECUBITAL  Final   Special Requests BOTTLES DRAWN AEROBIC AND ANAEROBIC 5CC  Final    Culture NO GROWTH 2 DAYS  Final   Report Status PENDING  Incomplete  Urine culture     Status: None   Collection Time: 07/23/15  7:25 PM  Result Value Ref Range Status   Specimen Description URINE, CLEAN CATCH  Final   Special Requests NONE  Final   Culture NO GROWTH  Final   Report Status 07/24/2015 FINAL  Final  Aerobic Culture (superficial specimen)     Status: None (Preliminary result)   Collection Time: 07/25/15 11:41 AM  Result Value Ref Range Status   Specimen Description WOUND LEFT TOE  Final   Special Requests L 5TH TOE  Final   Gram Stain   Final    ABUNDANT WBC PRESENT, PREDOMINANTLY PMN NO SQUAMOUS EPITHELIAL CELLS SEEN MODERATE GRAM POSITIVE COCCI IN PAIRS FEW GRAM NEGATIVE COCCOBACILLI    Culture PENDING  Incomplete   Report Status PENDING  Incomplete    Radiology Reports Mr Foot Left W Wo Contrast  07/24/2015  CLINICAL DATA:  Left foot wound and swelling. Weeping at the site of a shaved callus of the foot reportedly along the lateral distal plantar foot. EXAM: MRI OF THE LEFT FOREFOOT WITHOUT AND WITH CONTRAST TECHNIQUE: Multiplanar,  multisequence MR imaging was performed both before and after administration of intravenous contrast. CONTRAST:  47mL MULTIHANCE GADOBENATE DIMEGLUMINE 529 MG/ML IV SOLN COMPARISON:  07/23/2015 radiographs FINDINGS: Osteomyelitis in the head and distal metaphysis of the fifth metatarsal with underlying ulceration along the lateral plantar subcutaneous tissues, and hypoenhancing tissues extending lateral to the fifth metatarsal and fifth MTP joint suspicious for possible soft tissue necrosis. This tracks dorsal to the lateral metatarsals as shown on images 2 through 7 series 13. There is abnormal edema and enhancement along the plantar musculature of the foot with the exception of the abductor hallucis muscle, this may reflect myositis. Osteomyelitis of the proximal phalanx fifth toe noted. No other osteomyelitis is observed. Marrow edema in the  plantar portions of the middle cuneiform and second and third metatarsals likely due to arthropathy. Lisfranc ligament intact. Edema tracks in the dorsal subcutaneous tissues the foot and may reflect cellulitis. IMPRESSION: 1. Osteomyelitis involving the distal head and adjacent metaphysis of the fifth metatarsal, and in the proximal phalanx of the fifth toe. There is adjacent plantar -lateral skin ulceration as well as a region of hypoenhancing tissue lateral to the fifth MTP joint and tracking proximally dorsal to the fourth and fifth metatarsals, potentially reflecting early tissue necrosis. 2. Diffuse regional cellulitis.  Probable plantar myositis. 3. Patchy edema in the midfoot and hindfoot likely due to arthropathy. Electronically Signed   By: Van Clines M.D.   On: 07/24/2015 17:16   Dg Foot Complete Left  07/23/2015  CLINICAL DATA:  Left foot pain edema redness wound lateral plantar surface 2 weeks EXAM: LEFT FOOT - COMPLETE 3+ VIEW COMPARISON:  None FINDINGS: No fracture or dislocation. Moderate soft tissue swelling laterally over the mid to distal fifth metatarsal and metatarsophalangeal joint with mild soft tissue head origin 80 suggesting edematous change. No underlying periosteal reaction or cortical destruction. Mild diffuse small vessel calcification. Small heel spur. IMPRESSION: Nonspecific soft tissue changes suggesting cellulitis. No definite evidence of osteomyelitis. Electronically Signed   By: Skipper Cliche M.D.   On: 07/23/2015 20:39     [facility], Venecia Mehl M.D on 07/25/2015 at 1:51 PM  Between 7am to 7pm - Pager - [phone]  After 7pm go to [email or web address] - password [facility]  [facility] -  Office  [phone]

## 2015-07-25 NOTE — Consult Note (Addendum)
WOC consult requested for foot wound.  Pt has osteomyelitis, according to MRI results. This complex medical condition is beyond the scope of [place] nursing.  Please consult ortho service for further plan of care. Please re-consult if further assistance is needed.  Thank-you,  Julien Girt MSN, [place], [place], [place], [place]

## 2015-07-25 NOTE — Consult Note (Signed)
[facility]  Date of Admission:  07/23/2015  Date of Consult:  07/25/2015  Reason for Consult:Diabetic Foot wound, osteomyelitis.  Referring Physician: Elgergawy  Impression/Recommendation Osteomyelitis Diabetic foot wound DM2 Sepsis (elevated WBC, lactic acid) HypoNatremia Poor dentition  Check HIV I have swabbed his wound with ETOH and sent a Cx of fresh d/c that was expressed.  hopefully will be able to narrow his abtx Have ortho eval Have vascular eval Diabetic foot order set ABIs Diabetic control to promote healing He had Tet vaccine Feb 2017 (by his hx).   Thank you so much for this interesting consult,   Bobby Rumpf (pager) [phone] [email or web address]  Danny Fuller is an 40 y.o. male.  HPI: 40 yo M with hx of DM2 for 17 years. Roughly one week ago, he had a callus on his L foot (near 5th toe) which he tried to remove.  This progressively became more swollen and tender and began to weep. He states the fluid was yellow and foul smelling. He took anbx which he had for a previous infection (? Name).  He was found to be septic in ED on 6-9 and was started on vanco/zosyn.  He underwent plain film which showed non-specific changes of cellulitis.  He then underwent MRI 6-10 which showed: 1. Osteomyelitis involving the distal head and adjacent metaphysis of the fifth metatarsal, and in the proximal phalanx of the fifth toe. There is adjacent plantar -lateral skin ulceration as well as a region of hypoenhancing tissue lateral to the fifth MTP joint and tracking proximally dorsal to the fourth and fifth metatarsals, potentially reflecting early tissue necrosis. 2. Diffuse regional cellulitis. Probable plantar myositis. 3. Patchy edema in the midfoot and hindfoot likely due to arthropathy.  Past Medical History  Diagnosis Date  . Diabetes mellitus   . Hypertension   . Asthma   . GERD (gastroesophageal reflux disease)   .  LHTDSKAJ(681.1)     Past Surgical History  Procedure Laterality Date  . Bilateral hip pins placed       No Known Allergies  Medications:  Scheduled: . amLODipine  5 mg Oral Daily  . enoxaparin (LOVENOX) injection  40 mg Subcutaneous Daily  . insulin aspart  0-5 Units Subcutaneous QHS  . insulin aspart  0-9 Units Subcutaneous TID WC  . insulin glargine  12 Units Subcutaneous Q24H  . lisinopril  20 mg Oral Daily  . piperacillin-tazobactam (ZOSYN)  IV  3.375 g Intravenous Q8H  . sodium chloride flush  3 mL Intravenous Q12H  . vancomycin  1,000 mg Intravenous Q8H    Abtx:  Anti-infectives    Start     Dose/Rate Route Frequency Ordered Stop   07/24/15 0200  piperacillin-tazobactam (ZOSYN) IVPB 3.375 g     3.375 g 12.5 mL/hr over 240 Minutes Intravenous Every 8 hours 07/23/15 1853     07/24/15 0200  vancomycin (VANCOCIN) IVPB 1000 mg/200 mL premix     1,000 mg 200 mL/hr over 60 Minutes Intravenous Every 8 hours 07/23/15 1853     07/23/15 1845  vancomycin (VANCOCIN) 2,000 mg in sodium chloride 0.9 % 500 mL IVPB     2,000 mg 250 mL/hr over 120 Minutes Intravenous  Once 07/23/15 1834 07/23/15 2041   07/23/15 1815  piperacillin-tazobactam (ZOSYN) IVPB 3.375 g     3.375 g 100 mL/hr over 30 Minutes Intravenous  Once 07/23/15 1810 07/23/15 1911   07/23/15 1815  vancomycin (VANCOCIN) IVPB 1000 mg/200 mL premix  Status:  Discontinued     1,000 mg 200 mL/hr over 60 Minutes Intravenous  Once 07/23/15 1810 07/23/15 1834      Total days of antibiotics: 2 vanco/zosyn          Social History:  reports that he has quit smoking. His smoking use included Cigarettes. He has never used smokeless tobacco. He reports that he drinks alcohol. He reports that he does not use illicit drugs.  Family History  Problem Relation Age of Onset  . Arthritis    . Diabetes    . Hypertension    . Hyperlipidemia    . Stroke    . Sudden death      General ROS: no vision change (had ophtho March 2017),  chills one day, no fever, FSG 150-250, no numbness, normal BM, normal urination. see HPI  Blood pressure 160/87, pulse 93, temperature 98.4 F (36.9 C), temperature source Oral, resp. rate 18, height _0  (1.778 m), weight 97.523 kg (215 lb), SpO2 100 %. General appearance: alert, cooperative and no distress Eyes: negative findings: conjunctivae and sclerae normal and pupils equal, round, reactive to light and accomodation Throat: abnormal findings: dentition: poor Neck: no adenopathy and supple, symmetrical, trachea midline Lungs: clear to auscultation bilaterally Heart: regular rate and rhythm Abdomen: normal findings: bowel sounds normal and soft, non-tender Extremities: edema LLE swollen, tender at ankle. No RLE wounds. 3+ DP pulses bilaterally. ~1x2 cm wound over 5th MT head. purulence of yellow-green easily expressable. .  and grossly normal light touch BLE.    Results for orders placed or performed during the hospital encounter of 07/23/15 (from the past 48 hour(s))  Comprehensive metabolic panel     Status: Abnormal   Collection Time: 07/23/15  5:31 PM  Result Value Ref Range   Sodium 128 (L) 135 - 145 mmol/L   Potassium 4.0 3.5 - 5.1 mmol/L   Chloride 93 (L) 101 - 111 mmol/L   CO2 21 (L) 22 - 32 mmol/L   Glucose, Bld 443 (H) 65 - 99 mg/dL   BUN 10 6 - 20 mg/dL   Creatinine, Ser 0.95 0.61 - 1.24 mg/dL   Calcium 9.6 8.9 - 10.3 mg/dL   Total Protein 7.9 6.5 - 8.1 g/dL   Albumin 3.7 3.5 - 5.0 g/dL   AST 18 15 - 41 U/L   ALT 14 (L) 17 - 63 U/L   Alkaline Phosphatase 99 38 - 126 U/L   Total Bilirubin 1.2 0.3 - 1.2 mg/dL   GFR calc non Af Amer >60 >60 mL/min   GFR calc Af Amer >60 >60 mL/min    Comment: (NOTE) The eGFR has been calculated using the CKD EPI equation. This calculation has not been validated in all clinical situations. eGFR's persistently <60 mL/min signify possible Chronic Kidney Disease.    Anion gap 14 5 - 15  CBC with Differential     Status: Abnormal    Collection Time: 07/23/15  5:31 PM  Result Value Ref Range   WBC 17.6 (H) 4.0 - 10.5 K/uL   RBC 4.71 4.22 - 5.81 MIL/uL   Hemoglobin 14.2 13.0 - 17.0 g/dL   HCT 39.9 39.0 - 52.0 %   MCV 84.7 78.0 - 100.0 fL   MCH 30.1 26.0 - 34.0 pg   MCHC 35.6 30.0 - 36.0 g/dL   RDW 11.8 11.5 - 15.5 %   Platelets 330 150 - 400 K/uL   Neutrophils Relative % 79 %   Neutro Abs 13.8 (H)  1.7 - 7.7 K/uL   Lymphocytes Relative 14 %   Lymphs Abs 2.5 0.7 - 4.0 K/uL   Monocytes Relative 7 %   Monocytes Absolute 1.3 (H) 0.1 - 1.0 K/uL   Eosinophils Relative 0 %   Eosinophils Absolute 0.0 0.0 - 0.7 K/uL   Basophils Relative 0 %   Basophils Absolute 0.0 0.0 - 0.1 K/uL  Culture, blood (Routine x 2)     Status: None (Preliminary result)   Collection Time: 07/23/15  5:32 PM  Result Value Ref Range   Specimen Description BLOOD RIGHT ANTECUBITAL    Special Requests BOTTLES DRAWN AEROBIC AND ANAEROBIC 5CC    Culture NO GROWTH < 24 HOURS    Report Status PENDING   I-Stat CG4 Lactic Acid, ED     Status: Abnormal   Collection Time: 07/23/15  5:43 PM  Result Value Ref Range   Lactic Acid, Venous 2.54 (HH) 0.5 - 2.0 mmol/L   Comment NOTIFIED PHYSICIAN   Culture, blood (Routine x 2)     Status: None (Preliminary result)   Collection Time: 07/23/15  6:34 PM  Result Value Ref Range   Specimen Description BLOOD LEFT ANTECUBITAL    Special Requests BOTTLES DRAWN AEROBIC AND ANAEROBIC 5CC    Culture NO GROWTH < 24 HOURS    Report Status PENDING   I-Stat CG4 Lactic Acid, ED  (not at  [facility])     Status: None   Collection Time: 07/23/15  6:43 PM  Result Value Ref Range   Lactic Acid, Venous 1.96 0.5 - 2.0 mmol/L  Urine culture     Status: None   Collection Time: 07/23/15  7:25 PM  Result Value Ref Range   Specimen Description URINE, CLEAN CATCH    Special Requests NONE    Culture NO GROWTH    Report Status 07/24/2015 FINAL   Urinalysis, Routine w reflex microscopic     Status: Abnormal   Collection Time: 07/23/15   7:26 PM  Result Value Ref Range   Color, Urine YELLOW YELLOW   APPearance CLEAR CLEAR   Specific Gravity, Urine 1.045 (H) 1.005 - 1.030   pH 5.5 5.0 - 8.0   Glucose, UA >1000 (A) NEGATIVE mg/dL   Hgb urine dipstick TRACE (A) NEGATIVE   Bilirubin Urine NEGATIVE NEGATIVE   Ketones, ur 15 (A) NEGATIVE mg/dL   Protein, ur 30 (A) NEGATIVE mg/dL   Nitrite NEGATIVE NEGATIVE   Leukocytes, UA NEGATIVE NEGATIVE  Urine microscopic-add on     Status: Abnormal   Collection Time: 07/23/15  7:26 PM  Result Value Ref Range   Squamous Epithelial / LPF 0-5 (A) NONE SEEN   WBC, UA 0-5 0 - 5 WBC/hpf   RBC / HPF 0-5 0 - 5 RBC/hpf   Bacteria, UA NONE SEEN NONE SEEN  I-Stat CG4 Lactic Acid, ED     Status: None   Collection Time: 07/23/15  9:16 PM  Result Value Ref Range   Lactic Acid, Venous 1.18 0.5 - 2.0 mmol/L  Glucose, capillary     Status: Abnormal   Collection Time: 07/23/15 11:13 PM  Result Value Ref Range   Glucose-Capillary 297 (H) 65 - 99 mg/dL  Basic metabolic panel     Status: Abnormal   Collection Time: 07/24/15  4:33 AM  Result Value Ref Range   Sodium 132 (L) 135 - 145 mmol/L   Potassium 4.2 3.5 - 5.1 mmol/L   Chloride 100 (L) 101 - 111 mmol/L   CO2 23 22 -  32 mmol/L   Glucose, Bld 349 (H) 65 - 99 mg/dL   BUN 8 6 - 20 mg/dL   Creatinine, Ser 0.76 0.61 - 1.24 mg/dL   Calcium 8.4 (L) 8.9 - 10.3 mg/dL   GFR calc non Af Amer >60 >60 mL/min   GFR calc Af Amer >60 >60 mL/min    Comment: (NOTE) The eGFR has been calculated using the CKD EPI equation. This calculation has not been validated in all clinical situations. eGFR's persistently <60 mL/min signify possible Chronic Kidney Disease.    Anion gap 9 5 - 15  CBC     Status: Abnormal   Collection Time: 07/24/15  4:33 AM  Result Value Ref Range   WBC 16.7 (H) 4.0 - 10.5 K/uL   RBC 3.92 (L) 4.22 - 5.81 MIL/uL   Hemoglobin 11.7 (L) 13.0 - 17.0 g/dL   HCT 34.2 (L) 39.0 - 52.0 %   MCV 87.2 78.0 - 100.0 fL   MCH 29.8 26.0 - 34.0  pg   MCHC 34.2 30.0 - 36.0 g/dL   RDW 12.3 11.5 - 15.5 %   Platelets 302 150 - 400 K/uL  Glucose, capillary     Status: Abnormal   Collection Time: 07/24/15  5:29 PM  Result Value Ref Range   Glucose-Capillary 351 (H) 65 - 99 mg/dL  Glucose, capillary     Status: Abnormal   Collection Time: 07/24/15  7:44 PM  Result Value Ref Range   Glucose-Capillary 299 (H) 65 - 99 mg/dL  Glucose, capillary     Status: Abnormal   Collection Time: 07/24/15 11:23 PM  Result Value Ref Range   Glucose-Capillary 313 (H) 65 - 99 mg/dL  CBC     Status: Abnormal   Collection Time: 07/25/15  5:02 AM  Result Value Ref Range   WBC 13.7 (H) 4.0 - 10.5 K/uL   RBC 3.91 (L) 4.22 - 5.81 MIL/uL   Hemoglobin 11.2 (L) 13.0 - 17.0 g/dL   HCT 33.4 (L) 39.0 - 52.0 %   MCV 85.4 78.0 - 100.0 fL   MCH 28.6 26.0 - 34.0 pg   MCHC 33.5 30.0 - 36.0 g/dL   RDW 12.0 11.5 - 15.5 %   Platelets 287 150 - 400 K/uL  Basic metabolic panel     Status: Abnormal   Collection Time: 07/25/15  5:02 AM  Result Value Ref Range   Sodium 131 (L) 135 - 145 mmol/L   Potassium 3.8 3.5 - 5.1 mmol/L   Chloride 101 101 - 111 mmol/L   CO2 22 22 - 32 mmol/L   Glucose, Bld 332 (H) 65 - 99 mg/dL   BUN 9 6 - 20 mg/dL   Creatinine, Ser 0.61 0.61 - 1.24 mg/dL   Calcium 8.6 (L) 8.9 - 10.3 mg/dL   GFR calc non Af Amer >60 >60 mL/min   GFR calc Af Amer >60 >60 mL/min    Comment: (NOTE) The eGFR has been calculated using the CKD EPI equation. This calculation has not been validated in all clinical situations. eGFR's persistently <60 mL/min signify possible Chronic Kidney Disease.    Anion gap 8 5 - 15  Glucose, capillary     Status: Abnormal   Collection Time: 07/25/15  7:53 AM  Result Value Ref Range   Glucose-Capillary 298 (H) 65 - 99 mg/dL      Component Value Date/Time   SDES URINE, CLEAN CATCH 07/23/2015 1925   SPECREQUEST NONE 07/23/2015 1925   CULT NO GROWTH 07/23/2015  1925   REPTSTATUS 07/24/2015 FINAL 07/23/2015 1925   Mr  Foot Left W Wo Contrast  07/24/2015  CLINICAL DATA:  Left foot wound and swelling. Weeping at the site of a shaved callus of the foot reportedly along the lateral distal plantar foot. EXAM: MRI OF THE LEFT FOREFOOT WITHOUT AND WITH CONTRAST TECHNIQUE: Multiplanar, multisequence MR imaging was performed both before and after administration of intravenous contrast. CONTRAST:  46m MULTIHANCE GADOBENATE DIMEGLUMINE 529 MG/ML IV SOLN COMPARISON:  07/23/2015 radiographs FINDINGS: Osteomyelitis in the head and distal metaphysis of the fifth metatarsal with underlying ulceration along the lateral plantar subcutaneous tissues, and hypoenhancing tissues extending lateral to the fifth metatarsal and fifth MTP joint suspicious for possible soft tissue necrosis. This tracks dorsal to the lateral metatarsals as shown on images 2 through 7 series 13. There is abnormal edema and enhancement along the plantar musculature of the foot with the exception of the abductor hallucis muscle, this may reflect myositis. Osteomyelitis of the proximal phalanx fifth toe noted. No other osteomyelitis is observed. Marrow edema in the plantar portions of the middle cuneiform and second and third metatarsals likely due to arthropathy. Lisfranc ligament intact. Edema tracks in the dorsal subcutaneous tissues the foot and may reflect cellulitis. IMPRESSION: 1. Osteomyelitis involving the distal head and adjacent metaphysis of the fifth metatarsal, and in the proximal phalanx of the fifth toe. There is adjacent plantar -lateral skin ulceration as well as a region of hypoenhancing tissue lateral to the fifth MTP joint and tracking proximally dorsal to the fourth and fifth metatarsals, potentially reflecting early tissue necrosis. 2. Diffuse regional cellulitis.  Probable plantar myositis. 3. Patchy edema in the midfoot and hindfoot likely due to arthropathy. Electronically Signed   By: WVan ClinesM.D.   On: 07/24/2015 17:16   Dg Foot  Complete Left  07/23/2015  CLINICAL DATA:  Left foot pain edema redness wound lateral plantar surface 2 weeks EXAM: LEFT FOOT - COMPLETE 3+ VIEW COMPARISON:  None FINDINGS: No fracture or dislocation. Moderate soft tissue swelling laterally over the mid to distal fifth metatarsal and metatarsophalangeal joint with mild soft tissue head origin 80 suggesting edematous change. No underlying periosteal reaction or cortical destruction. Mild diffuse small vessel calcification. Small heel spur. IMPRESSION: Nonspecific soft tissue changes suggesting cellulitis. No definite evidence of osteomyelitis. Electronically Signed   By: RSkipper ClicheM.D.   On: 07/23/2015 20:39   Recent Results (from the past 240 hour(s))  Culture, blood (Routine x 2)     Status: None (Preliminary result)   Collection Time: 07/23/15  5:32 PM  Result Value Ref Range Status   Specimen Description BLOOD RIGHT ANTECUBITAL  Final   Special Requests BOTTLES DRAWN AEROBIC AND ANAEROBIC 5CC  Final   Culture NO GROWTH < 24 HOURS  Final   Report Status PENDING  Incomplete  Culture, blood (Routine x 2)     Status: None (Preliminary result)   Collection Time: 07/23/15  6:34 PM  Result Value Ref Range Status   Specimen Description BLOOD LEFT ANTECUBITAL  Final   Special Requests BOTTLES DRAWN AEROBIC AND ANAEROBIC 5CC  Final   Culture NO GROWTH < 24 HOURS  Final   Report Status PENDING  Incomplete  Urine culture     Status: None   Collection Time: 07/23/15  7:25 PM  Result Value Ref Range Status   Specimen Description URINE, CLEAN CATCH  Final   Special Requests NONE  Final   Culture NO GROWTH  Final  Report Status 07/24/2015 FINAL  Final      07/25/2015, 11:17 AM     LOS: 2 days    Records and images were personally reviewed where available.

## 2015-07-25 NOTE — Consult Note (Signed)
ORTHOPAEDIC CONSULTATION  REQUESTING PHYSICIAN: Albertine Patricia, MD  Chief Complaint: Osteomyelitis ulceration left foot fifth metatarsal head.  HPI: Danny Fuller is a 40 y.o. 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  Past Medical History  Diagnosis Date  . Diabetes mellitus   . Hypertension   . Asthma   . GERD (gastroesophageal reflux disease)   . [id number])    Past Surgical History  Procedure Laterality Date  . Bilateral hip pins placed     Social History   Social History  . Marital Status: Married    Spouse Name: N/A  . Number of Children: N/A  . Years of Education: N/A   Social History Main Topics  . Smoking status: Former Smoker    Types: Cigarettes  . Smokeless tobacco: Never Used     Comment: quit 5 days ago  . Alcohol Use: 0.0 oz/week    0 Standard drinks or equivalent per week     Comment: once or twice a month  . Drug Use: No  . Sexual Activity: Not Asked   Other Topics Concern  . None   Social History Narrative   Family History  Problem Relation Age of Onset  . Arthritis    . Diabetes    . Hypertension    . Hyperlipidemia    . Stroke    . Sudden death     - negative except otherwise stated in the family history section No Known Allergies Prior to Admission medications   Medication Sig Start Date End Date Taking? Authorizing Provider  amLODipine (NORVASC) 5 MG tablet Take 1 tablet (5 mg total) by mouth daily. 03/26/15  Yes [name], MD  glipiZIDE (GLUCOTROL) 10 MG tablet Take 1 tablet (10 mg total) by mouth 2 (two) times daily before a meal. 03/26/15  Yes [name], MD  lisinopril (PRINIVIL,ZESTRIL) 20 MG tablet Take 1 tablet (20 mg total) by mouth daily. 03/26/15  Yes [name], MD  metFORMIN (GLUCOPHAGE) 1000 MG tablet Take 1 tablet (1,000 mg total) by mouth 2 (two) times daily with a meal.  03/26/15  Yes [name], MD  methocarbamol (ROBAXIN) 500 MG tablet Take 1 tablet (500 mg total) by mouth 2 (two) times daily. Patient not taking: Reported on 07/23/2015 06/13/15   Montine Circle, PA-C  sildenafil (VIAGRA) 100 MG tablet Take 1 tablet (100 mg total) by mouth daily as needed for erectile dysfunction. Patient not taking: Reported on 06/13/2015 04/23/15   [name], MD  tadalafil (CIALIS) 20 MG tablet Take 1 tablet (20 mg total) by mouth daily as needed for erectile dysfunction. Patient not taking: Reported on 06/13/2015 04/27/15   [name], MD   Mr Foot Left W Wo Contrast  07/24/2015  CLINICAL DATA:  Left foot wound and swelling. Weeping at the site of a shaved callus of the foot reportedly along the lateral distal plantar foot. EXAM: MRI OF THE LEFT FOREFOOT WITHOUT AND WITH CONTRAST TECHNIQUE: Multiplanar, multisequence MR imaging was performed both before and after administration of intravenous contrast. CONTRAST:  95mL MULTIHANCE GADOBENATE DIMEGLUMINE 529 MG/ML IV SOLN COMPARISON:  07/23/2015 radiographs FINDINGS: Osteomyelitis in the head and distal metaphysis of the fifth metatarsal with underlying ulceration along the lateral plantar subcutaneous tissues, and hypoenhancing tissues extending lateral to the fifth metatarsal and fifth MTP joint suspicious for possible soft tissue  necrosis. This tracks dorsal to the lateral metatarsals as shown on images 2 through 7 series 13. There is abnormal edema and enhancement along the plantar musculature of the foot with the exception of the abductor hallucis muscle, this may reflect myositis. Osteomyelitis of the proximal phalanx fifth toe noted. No other osteomyelitis is observed. Marrow edema in the plantar portions of the middle cuneiform and second and third metatarsals likely due to arthropathy. Lisfranc ligament intact. Edema tracks in the dorsal subcutaneous tissues the foot and may reflect cellulitis. IMPRESSION: 1. Osteomyelitis  involving the distal head and adjacent metaphysis of the fifth metatarsal, and in the proximal phalanx of the fifth toe. There is adjacent plantar -lateral skin ulceration as well as a region of hypoenhancing tissue lateral to the fifth MTP joint and tracking proximally dorsal to the fourth and fifth metatarsals, potentially reflecting early tissue necrosis. 2. Diffuse regional cellulitis.  Probable plantar myositis. 3. Patchy edema in the midfoot and hindfoot likely due to arthropathy. Electronically Signed   By: Van Clines M.D.   On: 07/24/2015 17:16   Dg Foot Complete Left  07/23/2015  CLINICAL DATA:  Left foot pain edema redness wound lateral plantar surface 2 weeks EXAM: LEFT FOOT - COMPLETE 3+ VIEW COMPARISON:  None FINDINGS: No fracture or dislocation. Moderate soft tissue swelling laterally over the mid to distal fifth metatarsal and metatarsophalangeal joint with mild soft tissue head origin 80 suggesting edematous change. No underlying periosteal reaction or cortical destruction. Mild diffuse small vessel calcification. Small heel spur. IMPRESSION: Nonspecific soft tissue changes suggesting cellulitis. No definite evidence of osteomyelitis. Electronically Signed   By: Skipper Cliche M.D.   On: 07/23/2015 20:39   - pertinent xrays, CT, MRI studies were reviewed and independently interpreted  Positive ROS: All other systems have been reviewed and were otherwise negative with the exception of those mentioned in the HPI and as above.  Physical Exam: General: Alert, no acute distress Cardiovascular: Venous edema bilateral lower extremities Respiratory: No cyanosis, no use of accessory musculature GI: No organomegaly, abdomen is soft and non-tender Skin: Ulceration fifth metatarsal head 15 mm in diameter 1 mm deep with fibrinous exudative tissue. Neurologic: Patient does not have protective sensation. Psychiatric: Patient is competent for consent with normal mood and affect Lymphatic: No  axillary or cervical lymphadenopathy  Pertinent laboratory studies: Patient states his most recent hemoglobin A1c was 13.0 he has pre-albumin 8.0 and C-reactive protein 19.7 and white blood cell count of 16.7.  MUSCULOSKELETAL:  On examination patient has a strong dorsalis pedis pulse. He does not have protective sensation he has some heel cord tightness venous edema. There is no ascending cellulitis. Review of the radiographs shows destructive changes of the fifth metatarsal head consistent with chronic osteomyelitis. There is some calcification of the digital vessels consistent with peripheral vascular disease. Review the MRI scan shows increased edema in the fifth metatarsal head as well as the base of the proximal phalanx of the fifth toe consistent with osteomyelitis.  Assessment: Assessment: Poorly controlled Diabetic insensate neuropathy with severe protein caloric malnutrition with elevated white blood cell count elevated C-reactive protein with osteomyelitis of the fifth metatarsal head left foot.  Plan: Plan: We will plan for a fifth ray amputation as add-on tomorrow. Risks and benefits of surgery were discussed with the patient including risk of the wound not healing need for additional surgery. Patient states he understands he will discuss this with his wife anticipate surgery tomorrow afternoon.  Thank you for the consult  and the opportunity to see Mr. Lina Sayre, MD [place] [phone] 1:30 PM

## 2015-07-26 ENCOUNTER — Inpatient Hospital Stay (HOSPITAL_COMMUNITY): Payer: BC Managed Care – PPO

## 2015-07-26 ENCOUNTER — Inpatient Hospital Stay (HOSPITAL_COMMUNITY): Payer: BC Managed Care – PPO | Admitting: Anesthesiology

## 2015-07-26 ENCOUNTER — Encounter (HOSPITAL_COMMUNITY)
Admission: EM | Disposition: A | Discharge status: Home / Self Care | Payer: Self-pay | Source: Home / Self Care | Attending: Internal Medicine

## 2015-07-26 DIAGNOSIS — M869 Osteomyelitis, unspecified: Secondary | ICD-10-CM

## 2015-07-26 DIAGNOSIS — E1169 Type 2 diabetes mellitus with other specified complication: Secondary | ICD-10-CM

## 2015-07-26 DIAGNOSIS — M79609 Pain in unspecified limb: Secondary | ICD-10-CM

## 2015-07-26 DIAGNOSIS — E119 Type 2 diabetes mellitus without complications: Secondary | ICD-10-CM

## 2015-07-26 DIAGNOSIS — M86172 Other acute osteomyelitis, left ankle and foot: Secondary | ICD-10-CM

## 2015-07-26 DIAGNOSIS — B9689 Other specified bacterial agents as the cause of diseases classified elsewhere: Secondary | ICD-10-CM

## 2015-07-26 DIAGNOSIS — L97509 Non-pressure chronic ulcer of other part of unspecified foot with unspecified severity: Secondary | ICD-10-CM

## 2015-07-26 DIAGNOSIS — E11621 Type 2 diabetes mellitus with foot ulcer: Secondary | ICD-10-CM

## 2015-07-26 HISTORY — PX: AMPUTATION: SHX166

## 2015-07-26 LAB — GLUCOSE, CAPILLARY
GLUCOSE-CAPILLARY: 316 mg/dL — AB (ref 65–99)
GLUCOSE-CAPILLARY: 202 mg/dL — AB (ref 65–99)
GLUCOSE-CAPILLARY: 205 mg/dL — AB (ref 65–99)
GLUCOSE-CAPILLARY: 153 mg/dL — AB (ref 65–99)
Glucose-Capillary: 239 mg/dL — ABNORMAL HIGH (ref 65–99)
Glucose-Capillary: 145 mg/dL — ABNORMAL HIGH (ref 65–99)
Glucose-Capillary: 152 mg/dL — ABNORMAL HIGH (ref 65–99)
Glucose-Capillary: 157 mg/dL — ABNORMAL HIGH (ref 65–99)

## 2015-07-26 LAB — HEMOGLOBIN A1C
HEMOGLOBIN A1C: 13.5 % — AB (ref 4.8–5.6)
Hgb A1c MFr Bld: 13.6 % — ABNORMAL HIGH (ref 4.8–5.6)
MEAN PLASMA GLUCOSE: 344 mg/dL
Mean Plasma Glucose: 341 mg/dL

## 2015-07-26 LAB — CBC
HEMATOCRIT: 33.7 % — AB (ref 39.0–52.0)
HEMOGLOBIN: 11.6 g/dL — AB (ref 13.0–17.0)
MCH: 29.1 pg (ref 26.0–34.0)
MCHC: 34.4 g/dL (ref 30.0–36.0)
MCV: 84.7 fL (ref 78.0–100.0)
Platelets: 316 10*3/uL (ref 150–400)
RBC: 3.98 MIL/uL — ABNORMAL LOW (ref 4.22–5.81)
RDW: 11.7 % (ref 11.5–15.5)
WBC: 13.2 10*3/uL — ABNORMAL HIGH (ref 4.0–10.5)

## 2015-07-26 LAB — BASIC METABOLIC PANEL
Anion gap: 10 (ref 5–15)
BUN: 6 mg/dL (ref 6–20)
CALCIUM: 8.4 mg/dL — AB (ref 8.9–10.3)
CHLORIDE: 99 mmol/L — AB (ref 101–111)
CO2: 22 mmol/L (ref 22–32)
Creatinine, Ser: 0.6 mg/dL — ABNORMAL LOW (ref 0.61–1.24)
GFR calc Af Amer: >60 mL/min (ref >60)
Glucose, Bld: 257 mg/dL — ABNORMAL HIGH (ref 65–99)
Potassium: 3.5 mmol/L (ref 3.5–5.1)
Sodium: 131 mmol/L — ABNORMAL LOW (ref 135–145)

## 2015-07-26 LAB — SURGICAL PCR SCREEN
MRSA, PCR: NEGATIVE
STAPHYLOCOCCUS AUREUS: NEGATIVE

## 2015-07-26 LAB — HIV ANTIBODY (ROUTINE TESTING W REFLEX): HIV SCREEN 4TH GENERATION: NONREACTIVE

## 2015-07-26 SURGERY — AMPUTATION, FOOT, RAY
Anesthesia: General | Site: Foot | Laterality: Left

## 2015-07-26 MED ORDER — 0.9 % SODIUM CHLORIDE (POUR BTL) OPTIME
TOPICAL | Status: DC | PRN
  Administered 2015-07-26: 1000 mL

## 2015-07-26 MED ORDER — SODIUM CHLORIDE 0.9 % IV SOLN: INTRAVENOUS | Status: DC

## 2015-07-26 MED ORDER — METHOCARBAMOL 500 MG PO TABS
500.0000 mg | ORAL_TABLET | Freq: Four times a day (QID) | ORAL | Status: DC | PRN
  Filled 2015-07-26: qty 1

## 2015-07-26 MED ORDER — SODIUM CHLORIDE 0.45 % IV SOLN
INTRAVENOUS | Status: DC
  Administered 2015-07-26: 06:00:00 via INTRAVENOUS

## 2015-07-26 MED ORDER — LIDOCAINE 2% (20 MG/ML) 5 ML SYRINGE
INTRAMUSCULAR | Status: AC
  Filled 2015-07-26: qty 5

## 2015-07-26 MED ORDER — SODIUM CHLORIDE 0.9 % IV SOLN
INTRAVENOUS | Status: DC
  Administered 2015-07-26 – 2015-07-28 (×3): via INTRAVENOUS
  Filled 2015-07-26 (×12): qty 1000

## 2015-07-26 MED ORDER — LACTATED RINGERS IV SOLN
INTRAVENOUS | Status: DC | PRN
  Administered 2015-07-26: 16:00:00 via INTRAVENOUS

## 2015-07-26 MED ORDER — PROPOFOL 10 MG/ML IV BOLUS
INTRAVENOUS | Status: AC
  Filled 2015-07-26: qty 20

## 2015-07-26 MED ORDER — PROPOFOL 10 MG/ML IV BOLUS
INTRAVENOUS | Status: DC | PRN
  Administered 2015-07-26: 200 mg via INTRAVENOUS

## 2015-07-26 MED ORDER — METOCLOPRAMIDE HCL 5 MG PO TABS: 5.0000 mg | ORAL_TABLET | Freq: Three times a day (TID) | ORAL | Status: DC | PRN

## 2015-07-26 MED ORDER — HYDROMORPHONE HCL 1 MG/ML IJ SOLN
INTRAMUSCULAR | Status: AC
  Filled 2015-07-26: qty 1

## 2015-07-26 MED ORDER — POTASSIUM CHLORIDE CRYS ER 20 MEQ PO TBCR
20.0000 meq | EXTENDED_RELEASE_TABLET | Freq: Once | ORAL | Status: AC
  Administered 2015-07-26: 20 meq via ORAL
  Filled 2015-07-26: qty 1

## 2015-07-26 MED ORDER — ACETAMINOPHEN 325 MG PO TABS: 650.0000 mg | ORAL_TABLET | Freq: Four times a day (QID) | ORAL | Status: DC | PRN

## 2015-07-26 MED ORDER — LIDOCAINE HCL (CARDIAC) 20 MG/ML IV SOLN
INTRAVENOUS | Status: DC | PRN
  Administered 2015-07-26: 60 mg via INTRAVENOUS

## 2015-07-26 MED ORDER — ACETAMINOPHEN 650 MG RE SUPP: 650.0000 mg | Freq: Four times a day (QID) | RECTAL | Status: DC | PRN

## 2015-07-26 MED ORDER — ONDANSETRON HCL 4 MG/2ML IJ SOLN
INTRAMUSCULAR | Status: DC | PRN
  Administered 2015-07-26: 4 mg via INTRAVENOUS

## 2015-07-26 MED ORDER — CHLORHEXIDINE GLUCONATE 4 % EX LIQD
60.0000 mL | Freq: Once | CUTANEOUS | Status: AC
  Administered 2015-07-26: 4 via TOPICAL
  Filled 2015-07-26: qty 60

## 2015-07-26 MED ORDER — ROCURONIUM BROMIDE 50 MG/5ML IV SOLN
INTRAVENOUS | Status: AC
  Filled 2015-07-26: qty 1

## 2015-07-26 MED ORDER — ONDANSETRON HCL 4 MG PO TABS: 4.0000 mg | ORAL_TABLET | Freq: Four times a day (QID) | ORAL | Status: DC | PRN

## 2015-07-26 MED ORDER — METHOCARBAMOL 1000 MG/10ML IJ SOLN
500.0000 mg | Freq: Four times a day (QID) | INTRAVENOUS | Status: DC | PRN
  Filled 2015-07-26: qty 5

## 2015-07-26 MED ORDER — METOCLOPRAMIDE HCL 5 MG/ML IJ SOLN: 5.0000 mg | Freq: Three times a day (TID) | INTRAMUSCULAR | Status: DC | PRN

## 2015-07-26 MED ORDER — ADULT MULTIVITAMIN W/MINERALS CH
1.0000 | ORAL_TABLET | Freq: Every day | ORAL | Status: DC
  Administered 2015-07-27 – 2015-07-29 (×3): 1 via ORAL
  Filled 2015-07-26 (×4): qty 1

## 2015-07-26 MED ORDER — MIDAZOLAM HCL 2 MG/2ML IJ SOLN
INTRAMUSCULAR | Status: AC
  Filled 2015-07-26: qty 2

## 2015-07-26 MED ORDER — HYDROMORPHONE HCL 1 MG/ML IJ SOLN
1.0000 mg | INTRAMUSCULAR | Status: DC | PRN
  Administered 2015-07-26 – 2015-07-28 (×8): 1 mg via INTRAVENOUS
  Filled 2015-07-26 (×8): qty 1

## 2015-07-26 MED ORDER — HYDROMORPHONE HCL 1 MG/ML IJ SOLN
0.2500 mg | INTRAMUSCULAR | Status: DC | PRN
  Administered 2015-07-26: 0.5 mg via INTRAVENOUS

## 2015-07-26 MED ORDER — SUCCINYLCHOLINE CHLORIDE 200 MG/10ML IV SOSY
PREFILLED_SYRINGE | INTRAVENOUS | Status: AC
  Filled 2015-07-26: qty 10

## 2015-07-26 MED ORDER — OXYCODONE HCL 5 MG PO TABS: 5.0000 mg | ORAL_TABLET | Freq: Once | ORAL | Status: DC | PRN

## 2015-07-26 MED ORDER — FENTANYL CITRATE (PF) 100 MCG/2ML IJ SOLN
INTRAMUSCULAR | Status: DC | PRN
  Administered 2015-07-26 (×3): 50 ug via INTRAVENOUS

## 2015-07-26 MED ORDER — LACTATED RINGERS IV SOLN
INTRAVENOUS | Status: DC
  Administered 2015-07-26: 16:00:00 via INTRAVENOUS

## 2015-07-26 MED ORDER — ONDANSETRON HCL 4 MG/2ML IJ SOLN
INTRAMUSCULAR | Status: AC
  Filled 2015-07-26: qty 2

## 2015-07-26 MED ORDER — MIDAZOLAM HCL 5 MG/5ML IJ SOLN
INTRAMUSCULAR | Status: DC | PRN
  Administered 2015-07-26: 2 mg via INTRAVENOUS

## 2015-07-26 MED ORDER — OXYCODONE HCL 5 MG PO TABS
5.0000 mg | ORAL_TABLET | ORAL | Status: DC | PRN
  Administered 2015-07-28: 10 mg via ORAL
  Filled 2015-07-26: qty 2

## 2015-07-26 MED ORDER — ONDANSETRON HCL 4 MG/2ML IJ SOLN: 4.0000 mg | Freq: Four times a day (QID) | INTRAMUSCULAR | Status: DC | PRN

## 2015-07-26 MED ORDER — FENTANYL CITRATE (PF) 250 MCG/5ML IJ SOLN
INTRAMUSCULAR | Status: AC
  Filled 2015-07-26: qty 5

## 2015-07-26 MED ORDER — ONDANSETRON HCL 4 MG/2ML IJ SOLN
4.0000 mg | Freq: Four times a day (QID) | INTRAMUSCULAR | Status: DC | PRN
  Administered 2015-07-28: 4 mg via INTRAVENOUS
  Filled 2015-07-26: qty 2

## 2015-07-26 MED ORDER — OXYCODONE HCL 5 MG/5ML PO SOLN: 5.0000 mg | Freq: Once | ORAL | Status: DC | PRN

## 2015-07-26 SURGICAL SUPPLY — 31 items
BLADE SAW SGTL MED 73X18.5 STR (BLADE) IMPLANT
BNDG COHESIVE 4X5 TAN STRL (GAUZE/BANDAGES/DRESSINGS) ×3 IMPLANT
BNDG GAUZE ELAST 4 BULKY (GAUZE/BANDAGES/DRESSINGS) ×3 IMPLANT
COVER SURGICAL LIGHT HANDLE (MISCELLANEOUS) ×6 IMPLANT
DRAPE U-SHAPE 47X51 STRL (DRAPES) ×6 IMPLANT
DRSG ADAPTIC 3X8 NADH LF (GAUZE/BANDAGES/DRESSINGS) ×3 IMPLANT
DRSG PAD ABDOMINAL 8X10 ST (GAUZE/BANDAGES/DRESSINGS) ×6 IMPLANT
DURAPREP 26ML APPLICATOR (WOUND CARE) ×3 IMPLANT
ELECT REM PT RETURN 9FT ADLT (ELECTROSURGICAL) ×3
ELECTRODE REM PT RTRN 9FT ADLT (ELECTROSURGICAL) ×1 IMPLANT
GAUZE SPONGE 4X4 12PLY STRL (GAUZE/BANDAGES/DRESSINGS) ×3 IMPLANT
GLOVE BIOGEL PI IND STRL 9 (GLOVE) ×1 IMPLANT
GLOVE BIOGEL PI INDICATOR 9 (GLOVE) ×2
GLOVE SURG ORTHO 9.0 STRL STRW (GLOVE) ×3 IMPLANT
GOWN STRL REUS W/ TWL LRG LVL3 (GOWN DISPOSABLE) ×1 IMPLANT
GOWN STRL REUS W/ TWL XL LVL3 (GOWN DISPOSABLE) ×2 IMPLANT
GOWN STRL REUS W/TWL LRG LVL3 (GOWN DISPOSABLE) ×3
GOWN STRL REUS W/TWL XL LVL3 (GOWN DISPOSABLE) ×6
KIT BASIN OR (CUSTOM PROCEDURE TRAY) ×3 IMPLANT
KIT ROOM TURNOVER OR (KITS) ×3 IMPLANT
NS IRRIG 1000ML POUR BTL (IV SOLUTION) ×3 IMPLANT
PACK ORTHO EXTREMITY (CUSTOM PROCEDURE TRAY) ×3 IMPLANT
PAD ARMBOARD 7.5X6 YLW CONV (MISCELLANEOUS) ×6 IMPLANT
SPONGE GAUZE 4X4 12PLY STER LF (GAUZE/BANDAGES/DRESSINGS) ×2 IMPLANT
SPONGE LAP 18X18 X RAY DECT (DISPOSABLE) ×3 IMPLANT
STOCKINETTE IMPERVIOUS LG (DRAPES) IMPLANT
SUT ETHILON 2 0 PSLX (SUTURE) ×6 IMPLANT
TOWEL OR 17X24 6PK STRL BLUE (TOWEL DISPOSABLE) ×1 IMPLANT
TOWEL OR 17X26 10 PK STRL BLUE (TOWEL DISPOSABLE) ×3 IMPLANT
UNDERPAD 30X30 INCONTINENT (UNDERPADS AND DIAPERS) ×3 IMPLANT
WATER STERILE IRR 1000ML POUR (IV SOLUTION) ×1 IMPLANT

## 2015-07-26 NOTE — Op Note (Signed)
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 male with a diagnosis of [place] who failed conservative measures and elected for surgical management.    The risks benefits and alternatives were discussed with the patient preoperatively including but not limited to the risks of infection, bleeding, nerve injury, cardiopulmonary complications, the need for revision surgery, among others, and the patient was willing to proceed.  OPERATIVE IMPLANTS: None  OPERATIVE FINDINGS: Patient had expansile abscess which now involve the entire dorsum of his foot.  OPERATIVE PROCEDURE: Patient is a 40 year old gentleman with uncontrolled diabetes. 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 Patient had rapid progression of the infection despite being on to IV antibiotics.  Patient brought the operating room and underwent a general anesthetic. After adequate levels anesthesia obtained patient's left lower extremity was prepped using DuraPrep draped into a sterile field a timeout was called. A racquet incision was made around the toe and ulcerative tissue carried back to the base of the fifth metatarsal. The fifth metatarsal was resected through the base. Patient had a large dissecting abscess which was involving the entire dorsum of the foot. This area was irrigated with normal saline necrotic muscle and tissue  was excised. The remaining tissue was healthy and viable. Local tissue rearrangement was used for wound closure of a wound 4 x 8 cm. A sterile compressive dressing was applied patient was extubated taken the PACU in stable condition.  Would recommend at least 3 additional days of IV antibiotics and discharged on oral antibiotics for 1 month.

## 2015-07-26 NOTE — Transfer of Care (Signed)
Immediate Anesthesia Transfer of Care Note  Patient: Danny Fuller  Procedure(s) Performed: Procedure(s): AMPUTATION LEFT FIFTH RAY (Left)  Patient Location: PACU  Anesthesia Type:General  Level of Consciousness: awake, alert , sedated and patient cooperative  Airway & Oxygen Therapy: Patient Spontanous Breathing and Patient connected to nasal cannula oxygen  Post-op Assessment: Report given to RN, Post -op Vital signs reviewed and stable and Patient moving all extremities  Post vital signs: Reviewed and stable  Last Vitals:  Filed Vitals:   07/26/15 0607 07/26/15 0753  BP: 148/84 157/84  Pulse: 96 97  Temp: 37.6 C 37.4 C  Resp: 20 19    Last Pain:  Filed Vitals:   07/26/15 0803  PainSc: 10-Worst pain ever      Patients Stated Pain Goal: 2 ([id number])  Complications: No apparent anesthesia complications

## 2015-07-26 NOTE — Anesthesia Preprocedure Evaluation (Signed)
Anesthesia Evaluation  Patient identified by MRN, date of birth, ID band Patient awake    Reviewed: Allergy & Precautions, NPO status , Patient's Chart, lab work & pertinent test results  Airway Mallampati: II   Neck ROM: full    Dental   Pulmonary asthma , former smoker,    breath sounds clear to auscultation       Cardiovascular hypertension,  Rhythm:regular Rate:Normal     Neuro/Psych  Headaches,    GI/Hepatic GERD  ,  Endo/Other  diabetes, Type 2obese  Renal/GU      Musculoskeletal   Abdominal   Peds  Hematology   Anesthesia Other Findings   Reproductive/Obstetrics                             Anesthesia Physical Anesthesia Plan  ASA: III  Anesthesia Plan: General   Post-op Pain Management:    Induction: Intravenous  Airway Management Planned: LMA  Additional Equipment:   Intra-op Plan:   Post-operative Plan:   Informed Consent: I have reviewed the patients History and Physical, chart, labs and discussed the procedure including the risks, benefits and alternatives for the proposed anesthesia with the patient or authorized representative who has indicated his/her understanding and acceptance.     Plan Discussed with: CRNA, Anesthesiologist and Surgeon  Anesthesia Plan Comments:         Anesthesia Quick Evaluation

## 2015-07-26 NOTE — Anesthesia Procedure Notes (Signed)
Procedure Name: LMA Insertion Date/Time: 07/26/2015 4:25 PM Performed by: Izora Gala Pre-anesthesia Checklist: Patient identified, Emergency Drugs available, Suction available and Patient being monitored Patient Re-evaluated:Patient Re-evaluated prior to inductionOxygen Delivery Method: Circle system utilized Preoxygenation: Pre-oxygenation with 100% oxygen Intubation Type: IV induction Ventilation: Mask ventilation without difficulty LMA: LMA inserted LMA Size: 5.0 Number of attempts: 1 Placement Confirmation: positive ETCO2 and breath sounds checked- equal and bilateral Tube secured with: Tape Dental Injury: Teeth and Oropharynx as per pre-operative assessment

## 2015-07-26 NOTE — Progress Notes (Signed)
PROGRESS NOTE                                                                                                                                                                                                             Patient Demographics:    Danny Fuller, is a 40 y.o. male, DOB - May 19, 1975, [id number]  Admit date - 07/23/2015   Admitting Physician Elwin Mocha, MD  Outpatient Primary MD for the patient is [name], MD  LOS - 3  Chief Complaint  Patient presents with  . Foot Pain       Brief Narrative   40 y.o. male with medical history significant diabetes, hypertension, asthma, reflux and chronic headache presents infected left foot diabetic ulcer, MRI foot significant for osteomyelitis.  Subjective:    Danny Fuller today has, No headache, No chest pain, No abdominal pain , Reports left foot pain is controlled.  Assessment  & Plan :    Principal Problem:   Diabetic foot ulcer ([place]) Active Problems:   Diabetes mellitus without complication ([place])   Essential hypertension   GERD   Asthma   Chronic headache   Cellulitis   Sepsis ([place])   Hyponatremia  Infected diabetic foot ulcer - Afebrile, but significant leukocytosis - MRI foot significant for left foot fifth head metatarsal osteomyelitis. - ID consult greatly appreciated, antibiotics per ID, on  IV vancomycin, flagyl and cefepime, follow on wound swab. - orthopedic consult greatly appreciated, plan for fifth ray amputation Today - ABI and Doppler waveforms within normal limits, will discuss with vascular surgery. - HIV test pending  Hyponatremia - Continue half-normal saline .  Diabetes mellitus - Continue with insulin sliding scale, hold glipizide and Metformin , remains uncontrolled , will hold an increasing Lantus giving patient is nothing by mouth for surgery . - Follow on hemoglobin A1c  Hypertension - Continue with lisinopril and amlodipine  Asthma -  When necessary nebs  Code Status : Full  Family Communication  : none at bedside  Disposition Plan  : Pending further work up after surgery.  Consults  : ID,orthopedic  Procedures  : none  DVT Prophylaxis  :  Lovenox  Lab Results  Component Value Date   PLT 316 07/26/2015    Antibiotics  :   Anti-infectives    Start  Dose/Rate Route Frequency Ordered Stop   07/26/15 0400  vancomycin (VANCOCIN) 1,500 mg in sodium chloride 0.9 % 500 mL IVPB     1,500 mg 250 mL/hr over 120 Minutes Intravenous Every 8 hours 07/25/15 1912     07/25/15 1945  vancomycin (VANCOCIN) IVPB 1000 mg/200 mL premix     1,000 mg 200 mL/hr over 60 Minutes Intravenous STAT 07/25/15 1930 07/25/15 2358   07/25/15 1915  vancomycin (VANCOCIN) IVPB 1000 mg/200 mL premix  Status:  Discontinued     1,000 mg 200 mL/hr over 60 Minutes Intravenous STAT 07/25/15 1908 07/25/15 1912   07/25/15 1915  vancomycin (VANCOCIN) 2,000 mg in sodium chloride 0.9 % 500 mL IVPB  Status:  Discontinued     2,000 mg 250 mL/hr over 120 Minutes Intravenous STAT 07/25/15 1912 07/25/15 1930   07/25/15 1400  ceFEPIme (MAXIPIME) 1 g in dextrose 5 % 50 mL IVPB     1 g 100 mL/hr over 30 Minutes Intravenous Every 8 hours 07/25/15 1150     07/25/15 1200  metroNIDAZOLE (FLAGYL) IVPB 500 mg     500 mg 100 mL/hr over 60 Minutes Intravenous Every 8 hours 07/25/15 1150     07/24/15 0200  piperacillin-tazobactam (ZOSYN) IVPB 3.375 g  Status:  Discontinued     3.375 g 12.5 mL/hr over 240 Minutes Intravenous Every 8 hours 07/23/15 1853 07/25/15 1150   07/24/15 0200  vancomycin (VANCOCIN) IVPB 1000 mg/200 mL premix  Status:  Discontinued     1,000 mg 200 mL/hr over 60 Minutes Intravenous Every 8 hours 07/23/15 1853 07/25/15 1908   07/23/15 1845  vancomycin (VANCOCIN) 2,000 mg in sodium chloride 0.9 % 500 mL IVPB     2,000 mg 250 mL/hr over 120 Minutes Intravenous  Once 07/23/15 1834 07/23/15 2041   07/23/15 1815  piperacillin-tazobactam (ZOSYN)  IVPB 3.375 g     3.375 g 100 mL/hr over 30 Minutes Intravenous  Once 07/23/15 1810 07/23/15 1911   07/23/15 1815  vancomycin (VANCOCIN) IVPB 1000 mg/200 mL premix  Status:  Discontinued     1,000 mg 200 mL/hr over 60 Minutes Intravenous  Once 07/23/15 1810 07/23/15 1834        Objective:   Filed Vitals:   07/25/15 1705 07/25/15 2032 07/26/15 0607 07/26/15 0753  BP: 143/85 138/78 148/84 157/84  Pulse: 102 101 96 97  Temp: 100.1 F (37.8 C) 99.7 F (37.6 C) 99.6 F (37.6 C) 99.3 F (37.4 C)  TempSrc: Oral   Oral  Resp: 18 18 20 19   Height:      Weight:   100.699 kg (222 lb)   SpO2: 100% 99% 98% 99%    Wt Readings from Last 3 Encounters:  07/26/15 100.699 kg (222 lb)  04/23/15 100.699 kg (222 lb)  03/26/15 101.606 kg (224 lb)     Intake/Output Summary (Last 24 hours) at 07/26/15 1155 Last data filed at 07/26/15 1013  Gross per 24 hour  Intake    683 ml  Output   2175 ml  Net  -1492 ml     Physical Exam  Awake Alert, Oriented X 3, No new F.N deficits, Normal affect [place].AT,PERRAL Supple Neck,No JVD, No cervical lymphadenopathy appriciated.  Symmetrical Chest wall movement, Good air movement bilaterally, CTAB RRR,No Gallops,Rubs or new Murmurs, No Parasternal Heave +ve B.Sounds, Abd Soft, No tenderness, No organomegaly appriciated, No rebound - guarding or rigidity. No Cyanosis, Clubbing , Lateral distal plantar ulceration, with minimal discharge, left foot swollen with mild erythema  Data Review:    CBC  Recent Labs Lab 07/23/15 1731 07/24/15 0433 07/25/15 0502 07/26/15 0406  WBC 17.6* 16.7* 13.7* 13.2*  HGB 14.2 11.7* 11.2* 11.6*  HCT 39.9 34.2* 33.4* 33.7*  PLT 330 302 287 316  MCV 84.7 87.2 85.4 84.7  MCH 30.1 29.8 28.6 29.1  MCHC 35.6 34.2 33.5 34.4  RDW 11.8 12.3 12.0 11.7  LYMPHSABS 2.5  --   --   --   MONOABS 1.3*  --   --   --   EOSABS 0.0  --   --   --   BASOSABS 0.0  --   --   --     Chemistries   Recent Labs Lab 07/23/15 1731  07/24/15 0433 07/25/15 0502 07/26/15 0406  NA 128* 132* 131* 131*  K 4.0 4.2 3.8 3.5  CL 93* 100* 101 99*  CO2 21* 23 22 22   GLUCOSE 443* 349* 332* 257*  BUN 10 8 9 6   CREATININE 0.95 0.76 0.61 0.60*  CALCIUM 9.6 8.4* 8.6* 8.4*  AST 18  --   --   --   ALT 14*  --   --   --   ALKPHOS 99  --   --   --   BILITOT 1.2  --   --   --    ------------------------------------------------------------------------------------------------------------------ No results for input(s): CHOL, HDL, LDLCALC, TRIG, CHOLHDL, LDLDIRECT in the last 72 hours.  Lab Results  Component Value Date   HGBA1C 13.6* 07/24/2015   ------------------------------------------------------------------------------------------------------------------ No results for input(s): TSH, T4TOTAL, T3FREE, THYROIDAB in the last 72 hours.  Invalid input(s): FREET3 ------------------------------------------------------------------------------------------------------------------ No results for input(s): VITAMINB12, FOLATE, FERRITIN, TIBC, IRON, RETICCTPCT in the last 72 hours.  Coagulation profile No results for input(s): INR, PROTIME in the last 168 hours.  No results for input(s): DDIMER in the last 72 hours.  Cardiac Enzymes No results for input(s): CKMB, TROPONINI, MYOGLOBIN in the last 168 hours.  Invalid input(s): CK ------------------------------------------------------------------------------------------------------------------ No results found for: BNP  Inpatient Medications  Scheduled Meds: . amLODipine  5 mg Oral Daily  . ceFEPime (MAXIPIME) IV  1 g Intravenous Q8H  . enoxaparin (LOVENOX) injection  40 mg Subcutaneous Daily  . insulin aspart  0-5 Units Subcutaneous QHS  . insulin aspart  0-9 Units Subcutaneous TID WC  . insulin glargine  12 Units Subcutaneous Daily  . lisinopril  20 mg Oral Daily  . metronidazole  500 mg Intravenous Q8H  . sodium chloride flush  3 mL Intravenous Q12H  . vancomycin  1,500 mg  Intravenous Q8H   Continuous Infusions: . 0.9 % sodium chloride with kcl 125 mL/hr at 07/26/15 1010   PRN Meds:.acetaminophen **OR** acetaminophen, albuterol, famotidine, hydrALAZINE, HYDROcodone-acetaminophen, polyethylene glycol, traZODone  Micro Results Recent Results (from the past 240 hour(s))  Culture, blood (Routine x 2)     Status: None (Preliminary result)   Collection Time: 07/23/15  5:32 PM  Result Value Ref Range Status   Specimen Description BLOOD RIGHT ANTECUBITAL  Final   Special Requests BOTTLES DRAWN AEROBIC AND ANAEROBIC 5CC  Final   Culture NO GROWTH 2 DAYS  Final   Report Status PENDING  Incomplete  Culture, blood (Routine x 2)     Status: None (Preliminary result)   Collection Time: 07/23/15  6:34 PM  Result Value Ref Range Status   Specimen Description BLOOD LEFT ANTECUBITAL  Final   Special Requests BOTTLES DRAWN AEROBIC AND ANAEROBIC 5CC  Final   Culture NO GROWTH 2 DAYS  Final   Report Status PENDING  Incomplete  Urine culture     Status: None   Collection Time: 07/23/15  7:25 PM  Result Value Ref Range Status   Specimen Description URINE, CLEAN CATCH  Final   Special Requests NONE  Final   Culture NO GROWTH  Final   Report Status 07/24/2015 FINAL  Final  Aerobic Culture (superficial specimen)     Status: None (Preliminary result)   Collection Time: 07/25/15 11:41 AM  Result Value Ref Range Status   Specimen Description WOUND LEFT TOE  Final   Special Requests L 5TH TOE  Final   Gram Stain   Final    ABUNDANT WBC PRESENT, PREDOMINANTLY PMN NO SQUAMOUS EPITHELIAL CELLS SEEN MODERATE GRAM POSITIVE COCCI IN PAIRS FEW GRAM NEGATIVE COCCOBACILLI    Culture PENDING  Incomplete   Report Status PENDING  Incomplete    Radiology Reports Mr Foot Left W Wo Contrast  07/24/2015  CLINICAL DATA:  Left foot wound and swelling. Weeping at the site of a shaved callus of the foot reportedly along the lateral distal plantar foot. EXAM: MRI OF THE LEFT FOREFOOT  WITHOUT AND WITH CONTRAST TECHNIQUE: Multiplanar, multisequence MR imaging was performed both before and after administration of intravenous contrast. CONTRAST:  77mL MULTIHANCE GADOBENATE DIMEGLUMINE 529 MG/ML IV SOLN COMPARISON:  07/23/2015 radiographs FINDINGS: Osteomyelitis in the head and distal metaphysis of the fifth metatarsal with underlying ulceration along the lateral plantar subcutaneous tissues, and hypoenhancing tissues extending lateral to the fifth metatarsal and fifth MTP joint suspicious for possible soft tissue necrosis. This tracks dorsal to the lateral metatarsals as shown on images 2 through 7 series 13. There is abnormal edema and enhancement along the plantar musculature of the foot with the exception of the abductor hallucis muscle, this may reflect myositis. Osteomyelitis of the proximal phalanx fifth toe noted. No other osteomyelitis is observed. Marrow edema in the plantar portions of the middle cuneiform and second and third metatarsals likely due to arthropathy. Lisfranc ligament intact. Edema tracks in the dorsal subcutaneous tissues the foot and may reflect cellulitis. IMPRESSION: 1. Osteomyelitis involving the distal head and adjacent metaphysis of the fifth metatarsal, and in the proximal phalanx of the fifth toe. There is adjacent plantar -lateral skin ulceration as well as a region of hypoenhancing tissue lateral to the fifth MTP joint and tracking proximally dorsal to the fourth and fifth metatarsals, potentially reflecting early tissue necrosis. 2. Diffuse regional cellulitis.  Probable plantar myositis. 3. Patchy edema in the midfoot and hindfoot likely due to arthropathy. Electronically Signed   By: Van Clines M.D.   On: 07/24/2015 17:16   Dg Foot Complete Left  07/23/2015  CLINICAL DATA:  Left foot pain edema redness wound lateral plantar surface 2 weeks EXAM: LEFT FOOT - COMPLETE 3+ VIEW COMPARISON:  None FINDINGS: No fracture or dislocation. Moderate soft tissue  swelling laterally over the mid to distal fifth metatarsal and metatarsophalangeal joint with mild soft tissue head origin 80 suggesting edematous change. No underlying periosteal reaction or cortical destruction. Mild diffuse small vessel calcification. Small heel spur. IMPRESSION: Nonspecific soft tissue changes suggesting cellulitis. No definite evidence of osteomyelitis. Electronically Signed   By: Skipper Cliche M.D.   On: 07/23/2015 20:39     [facility], Nichole Neyer M.D on 07/26/2015 at 11:55 AM  Between 7am to 7pm - Pager - [phone]  After 7pm go to [email or web address] - password [facility]  [facility] -  Office  [phone]

## 2015-07-26 NOTE — Anesthesia Postprocedure Evaluation (Signed)
Anesthesia Post Note  Patient: Danny Fuller  Procedure(s) Performed: Procedure(s) (LRB): AMPUTATION LEFT FIFTH RAY (Left)  Patient location during evaluation: PACU Anesthesia Type: General Level of consciousness: awake and alert and patient cooperative Pain management: pain level controlled Vital Signs Assessment: post-procedure vital signs reviewed and stable Respiratory status: spontaneous breathing and respiratory function stable Cardiovascular status: stable Anesthetic complications: no    Last Vitals:  Filed Vitals:   07/26/15 1730 07/26/15 1735  BP: 136/93   Pulse: 108   Temp:  36.7 C  Resp: 17     Last Pain:  Filed Vitals:   07/26/15 1736  PainSc: 4     LLE Motor Response: Purposeful movement;Responds to commands (07/26/15 1730) LLE Sensation: Full sensation (07/26/15 1730)          [place]

## 2015-07-26 NOTE — Interval H&P Note (Signed)
History and Physical Interval Note:  07/26/2015 6:08 AM  Danny Fuller  has presented today for surgery, with the diagnosis of OSTEOMYELITIS 5TH METATARSAL HEAD  The various methods of treatment have been discussed with the patient and family. After consideration of risks, benefits and other options for treatment, the patient has consented to  Procedure(s): AMPUTATION RAY (Left) as a surgical intervention .  The patient's history has been reviewed, patient examined, no change in status, stable for surgery.  I have reviewed the patient's chart and labs.  Questions were answered to the patient's satisfaction.     [name]

## 2015-07-26 NOTE — Progress Notes (Signed)
    [facility]   Reason for visit: Follow up on osteomyelitis  Interval History: getting amputation today.  No fever, swollen.   Physical Exam: Constitutional:  Filed Vitals:   07/26/15 0607 07/26/15 0753  BP: 148/84 157/84  Pulse: 96 97  Temp: 99.6 F (37.6 C) 99.3 F (37.4 C)  Resp: 20 19   patient appears in NAD Respiratory: Normal respiratory effort; CTA B Cardiovascular: RRR MS: left foot with edema after being upright, small amount of pus-like drainage  Review of Systems: Constitutional: negative for fevers and chills Gastrointestinal: negative for diarrhea  Lab Results  Component Value Date   WBC 13.2* 07/26/2015   HGB 11.6* 07/26/2015   HCT 33.7* 07/26/2015   MCV 84.7 07/26/2015   PLT 316 07/26/2015    Lab Results  Component Value Date   CREATININE 0.60* 07/26/2015   BUN 6 07/26/2015   NA 131* 07/26/2015   K 3.5 07/26/2015   CL 99* 07/26/2015   CO2 22 07/26/2015    Lab Results  Component Value Date   ALT 14* 07/23/2015   AST 18 07/23/2015   ALKPHOS 99 07/23/2015     Microbiology: Recent Results (from the past 240 hour(s))  Culture, blood (Routine x 2)     Status: None (Preliminary result)   Collection Time: 07/23/15  5:32 PM  Result Value Ref Range Status   Specimen Description BLOOD RIGHT ANTECUBITAL  Final   Special Requests BOTTLES DRAWN AEROBIC AND ANAEROBIC 5CC  Final   Culture NO GROWTH 2 DAYS  Final   Report Status PENDING  Incomplete  Culture, blood (Routine x 2)     Status: None (Preliminary result)   Collection Time: 07/23/15  6:34 PM  Result Value Ref Range Status   Specimen Description BLOOD LEFT ANTECUBITAL  Final   Special Requests BOTTLES DRAWN AEROBIC AND ANAEROBIC 5CC  Final   Culture NO GROWTH 2 DAYS  Final   Report Status PENDING  Incomplete  Urine culture     Status: None   Collection Time: 07/23/15  7:25 PM  Result Value Ref Range Status   Specimen Description URINE, CLEAN CATCH  Final   Special Requests NONE  Final   Culture NO GROWTH  Final   Report Status 07/24/2015 FINAL  Final  Aerobic Culture (superficial specimen)     Status: None (Preliminary result)   Collection Time: 07/25/15 11:41 AM  Result Value Ref Range Status   Specimen Description WOUND LEFT TOE  Final   Special Requests L 5TH TOE  Final   Gram Stain   Final    ABUNDANT WBC PRESENT, PREDOMINANTLY PMN NO SQUAMOUS EPITHELIAL CELLS SEEN MODERATE GRAM POSITIVE COCCI IN PAIRS FEW GRAM NEGATIVE COCCOBACILLI    Culture PENDING  Incomplete   Report Status PENDING  Incomplete    Impression/Plan:  1. Osteomyelitis - amputation today.  Will check path and OP note to verify margins good.  Will not need prolonged antibiotics or IV likely.  2. DM - needs good control.

## 2015-07-26 NOTE — H&P (View-Only) (Signed)
ORTHOPAEDIC CONSULTATION  REQUESTING PHYSICIAN: Albertine Patricia, MD  Chief Complaint: Osteomyelitis ulceration left foot fifth metatarsal head.  HPI: Danny Fuller is a 40 y.o. 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  Past Medical History  Diagnosis Date  . Diabetes mellitus   . Hypertension   . Asthma   . GERD (gastroesophageal reflux disease)   . [id number])    Past Surgical History  Procedure Laterality Date  . Bilateral hip pins placed     Social History   Social History  . Marital Status: Married    Spouse Name: N/A  . Number of Children: N/A  . Years of Education: N/A   Social History Main Topics  . Smoking status: Former Smoker    Types: Cigarettes  . Smokeless tobacco: Never Used     Comment: quit 5 days ago  . Alcohol Use: 0.0 oz/week    0 Standard drinks or equivalent per week     Comment: once or twice a month  . Drug Use: No  . Sexual Activity: Not Asked   Other Topics Concern  . None   Social History Narrative   Family History  Problem Relation Age of Onset  . Arthritis    . Diabetes    . Hypertension    . Hyperlipidemia    . Stroke    . Sudden death     - negative except otherwise stated in the family history section No Known Allergies Prior to Admission medications   Medication Sig Start Date End Date Taking? Authorizing Provider  amLODipine (NORVASC) 5 MG tablet Take 1 tablet (5 mg total) by mouth daily. 03/26/15  Yes [name], MD  glipiZIDE (GLUCOTROL) 10 MG tablet Take 1 tablet (10 mg total) by mouth 2 (two) times daily before a meal. 03/26/15  Yes [name], MD  lisinopril (PRINIVIL,ZESTRIL) 20 MG tablet Take 1 tablet (20 mg total) by mouth daily. 03/26/15  Yes [name], MD  metFORMIN (GLUCOPHAGE) 1000 MG tablet Take 1 tablet (1,000 mg total) by mouth 2 (two) times daily with a meal.  03/26/15  Yes [name], MD  methocarbamol (ROBAXIN) 500 MG tablet Take 1 tablet (500 mg total) by mouth 2 (two) times daily. Patient not taking: Reported on 07/23/2015 06/13/15   Montine Circle, PA-C  sildenafil (VIAGRA) 100 MG tablet Take 1 tablet (100 mg total) by mouth daily as needed for erectile dysfunction. Patient not taking: Reported on 06/13/2015 04/23/15   [name], MD  tadalafil (CIALIS) 20 MG tablet Take 1 tablet (20 mg total) by mouth daily as needed for erectile dysfunction. Patient not taking: Reported on 06/13/2015 04/27/15   [name], MD   Mr Foot Left W Wo Contrast  07/24/2015  CLINICAL DATA:  Left foot wound and swelling. Weeping at the site of a shaved callus of the foot reportedly along the lateral distal plantar foot. EXAM: MRI OF THE LEFT FOREFOOT WITHOUT AND WITH CONTRAST TECHNIQUE: Multiplanar, multisequence MR imaging was performed both before and after administration of intravenous contrast. CONTRAST:  59mL MULTIHANCE GADOBENATE DIMEGLUMINE 529 MG/ML IV SOLN COMPARISON:  07/23/2015 radiographs FINDINGS: Osteomyelitis in the head and distal metaphysis of the fifth metatarsal with underlying ulceration along the lateral plantar subcutaneous tissues, and hypoenhancing tissues extending lateral to the fifth metatarsal and fifth MTP joint suspicious for possible soft tissue  necrosis. This tracks dorsal to the lateral metatarsals as shown on images 2 through 7 series 13. There is abnormal edema and enhancement along the plantar musculature of the foot with the exception of the abductor hallucis muscle, this may reflect myositis. Osteomyelitis of the proximal phalanx fifth toe noted. No other osteomyelitis is observed. Marrow edema in the plantar portions of the middle cuneiform and second and third metatarsals likely due to arthropathy. Lisfranc ligament intact. Edema tracks in the dorsal subcutaneous tissues the foot and may reflect cellulitis. IMPRESSION: 1. Osteomyelitis  involving the distal head and adjacent metaphysis of the fifth metatarsal, and in the proximal phalanx of the fifth toe. There is adjacent plantar -lateral skin ulceration as well as a region of hypoenhancing tissue lateral to the fifth MTP joint and tracking proximally dorsal to the fourth and fifth metatarsals, potentially reflecting early tissue necrosis. 2. Diffuse regional cellulitis.  Probable plantar myositis. 3. Patchy edema in the midfoot and hindfoot likely due to arthropathy. Electronically Signed   By: Van Clines M.D.   On: 07/24/2015 17:16   Dg Foot Complete Left  07/23/2015  CLINICAL DATA:  Left foot pain edema redness wound lateral plantar surface 2 weeks EXAM: LEFT FOOT - COMPLETE 3+ VIEW COMPARISON:  None FINDINGS: No fracture or dislocation. Moderate soft tissue swelling laterally over the mid to distal fifth metatarsal and metatarsophalangeal joint with mild soft tissue head origin 80 suggesting edematous change. No underlying periosteal reaction or cortical destruction. Mild diffuse small vessel calcification. Small heel spur. IMPRESSION: Nonspecific soft tissue changes suggesting cellulitis. No definite evidence of osteomyelitis. Electronically Signed   By: Skipper Cliche M.D.   On: 07/23/2015 20:39   - pertinent xrays, CT, MRI studies were reviewed and independently interpreted  Positive ROS: All other systems have been reviewed and were otherwise negative with the exception of those mentioned in the HPI and as above.  Physical Exam: General: Alert, no acute distress Cardiovascular: Venous edema bilateral lower extremities Respiratory: No cyanosis, no use of accessory musculature GI: No organomegaly, abdomen is soft and non-tender Skin: Ulceration fifth metatarsal head 15 mm in diameter 1 mm deep with fibrinous exudative tissue. Neurologic: Patient does not have protective sensation. Psychiatric: Patient is competent for consent with normal mood and affect Lymphatic: No  axillary or cervical lymphadenopathy  Pertinent laboratory studies: Patient states his most recent hemoglobin A1c was 13.0 he has pre-albumin 8.0 and C-reactive protein 19.7 and white blood cell count of 16.7.  MUSCULOSKELETAL:  On examination patient has a strong dorsalis pedis pulse. He does not have protective sensation he has some heel cord tightness venous edema. There is no ascending cellulitis. Review of the radiographs shows destructive changes of the fifth metatarsal head consistent with chronic osteomyelitis. There is some calcification of the digital vessels consistent with peripheral vascular disease. Review the MRI scan shows increased edema in the fifth metatarsal head as well as the base of the proximal phalanx of the fifth toe consistent with osteomyelitis.  Assessment: Assessment: Poorly controlled Diabetic insensate neuropathy with severe protein caloric malnutrition with elevated white blood cell count elevated C-reactive protein with osteomyelitis of the fifth metatarsal head left foot.  Plan: Plan: We will plan for a fifth ray amputation as add-on tomorrow. Risks and benefits of surgery were discussed with the patient including risk of the wound not healing need for additional surgery. Patient states he understands he will discuss this with his wife anticipate surgery tomorrow afternoon.  Thank you for the consult  and the opportunity to see Mr. Lina Sayre, MD [place] [phone] 1:30 PM

## 2015-07-26 NOTE — Progress Notes (Signed)
Inpatient Diabetes Program Recommendations  AACE/ADA: New Consensus Statement on Inpatient Glycemic Control (2015)  Target Ranges:  Prepandial:   less than 140 mg/dL      Peak postprandial:   less than 180 mg/dL (1-2 hours)      Critically ill patients:  140 - 180 mg/dL   Results for KARDIER, FRINK (MRN [id number]) as of 07/26/2015 09:15  Ref. Range 07/25/2015 07:53 07/25/2015 11:36 07/25/2015 17:03 07/25/2015 23:05 07/26/2015 07:52  Glucose-Capillary Latest Ref Range: 65-99 mg/dL 298 (H) 299 (H) 254 (H) 316 (H) 239 (H)   Review of Glycemic Control  Diabetes history: DM 2, uncontrolled Outpatient Diabetes medications: Glipizide 10 mg BID, Metformin 1,000 mg BID Current orders for Inpatient glycemic control: Lantus 12 units Daily, Novolog Sensitive TID + HS  Inpatient Diabetes Program Recommendations: Insulin - Basal: Fasting glucose 239 this am. Please consider increasing basal insulin again to Lantus 18 units Daily.  A1c pending. Will speak with patient once it results.  Thanks,  Tama Headings RN, MSN, [facility] Inpatient Diabetes Coordinator Team Pager [phone] (8a-5p)

## 2015-07-26 NOTE — Progress Notes (Signed)
Initial Nutrition Assessment  DOCUMENTATION CODES:   Obesity unspecified  INTERVENTION:   -MVI daily -RD will follow for diet advancement and supplement diet as appropriate  NUTRITION DIAGNOSIS:   Increased nutrient needs related to wound healing as evidenced by estimated needs.  GOAL:   Patient will meet greater than or equal to 90% of their needs  MONITOR:   PO intake, Supplement acceptance, Diet advancement, Labs, Weight trends, Skin, I & O's  REASON FOR ASSESSMENT:   Consult Wound healing  ASSESSMENT:   Danny Fuller is a 40 y.o. 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  Pt admitted with lt foot osteomyelitis.  RD consulted for wound healing and diet education. Noted Hgb A1c 13.6 on 07/24/15. Home DM regimen 10 mg glipizide BID and 1000 mg Metformin BID.    Pt to undergo PROCEDURE on 07/26/15: [place] with local tissue rearrangement for wound closure 4 x 8 cm  Attempted to examine pt x 2, however, pt either in with MD or receiving nursing care at time of visit. Pt about to be taken to OR at time of visit.   Unable to complete Nutrition-Focused physical exam at this time.   Wt hx reviewed. Wt has been stable over the past year.   Pt with increased nutritional needs related to wound healing. RD will supplement diet as appropriate once diet is advanced. RD will also address education needs once pt is closer to discharge.   Labs reviewed: CBGS: 202-205.   Diet Order:  Diet heart healthy/carb modified Room service appropriate?: Yes; Fluid consistency:: Thin Diet NPO time specified  Skin:  Wound (see comment) (lt lateral foot wound)  Last BM:  PTA  Height:   Ht Readings from Last 1 Encounters:  07/23/15 5\' 10"  (1.778 m)    Weight:   Wt Readings from Last 1 Encounters:  07/26/15 222 lb (100.699 kg)    Ideal  Body Weight:  75.5 kg  BMI:  Body mass index is 31.85 kg/(m^2).  Estimated Nutritional Needs:   Kcal:  1800-2000  Protein:  100-115 grams  Fluid:  1.8-2.0 L  EDUCATION NEEDS:   No education needs identified at this time  Juniel Groene A. Jimmye Norman, RD, LDN, CDE Pager: [phone] After hours Pager: [phone]

## 2015-07-26 NOTE — Progress Notes (Signed)
VASCULAR LAB PRELIMINARY  ARTERIAL  ABI completed:    RIGHT    LEFT    PRESSURE WAVEFORM  PRESSURE WAVEFORM  BRACHIAL 203 Triphasic BRACHIAL 192 Triphasic  DP 208 Triphasic DP 190 Triphasic  PT 211 Triphasic PT 180 Triphasic    RIGHT LEFT  ABI 1.04 0.94   ABIs and Doppler waveforms are within normal limits bilaterally at rest.  Maram Bently, RVS 07/26/2015, 11:29 AM

## 2015-07-27 ENCOUNTER — Encounter (HOSPITAL_COMMUNITY): Payer: Self-pay | Admitting: Orthopedic Surgery

## 2015-07-27 DIAGNOSIS — E08621 Diabetes mellitus due to underlying condition with foot ulcer: Secondary | ICD-10-CM

## 2015-07-27 DIAGNOSIS — M869 Osteomyelitis, unspecified: Secondary | ICD-10-CM

## 2015-07-27 LAB — GLUCOSE, CAPILLARY
GLUCOSE-CAPILLARY: 227 mg/dL — AB (ref 65–99)
Glucose-Capillary: 270 mg/dL — ABNORMAL HIGH (ref 65–99)
Glucose-Capillary: 264 mg/dL — ABNORMAL HIGH (ref 65–99)

## 2015-07-27 LAB — HEMOGLOBIN A1C
Hgb A1c MFr Bld: 13.7 % — ABNORMAL HIGH (ref 4.8–5.6)
Mean Plasma Glucose: 346 mg/dL

## 2015-07-27 LAB — CBC
HEMATOCRIT: 33.2 % — AB (ref 39.0–52.0)
Hemoglobin: 11.4 g/dL — ABNORMAL LOW (ref 13.0–17.0)
MCH: 29.3 pg (ref 26.0–34.0)
MCHC: 34.3 g/dL (ref 30.0–36.0)
MCV: 85.3 fL (ref 78.0–100.0)
PLATELETS: 374 10*3/uL (ref 150–400)
RBC: 3.89 MIL/uL — ABNORMAL LOW (ref 4.22–5.81)
RDW: 12 % (ref 11.5–15.5)
WBC: 15.6 10*3/uL — AB (ref 4.0–10.5)

## 2015-07-27 LAB — BASIC METABOLIC PANEL
Anion gap: 9 (ref 5–15)
CALCIUM: 8.6 mg/dL — AB (ref 8.9–10.3)
CO2: 23 mmol/L (ref 22–32)
CREATININE: 0.67 mg/dL (ref 0.61–1.24)
Chloride: 99 mmol/L — ABNORMAL LOW (ref 101–111)
GFR calc Af Amer: >60 mL/min (ref >60)
GLUCOSE: 218 mg/dL — AB (ref 65–99)
Potassium: 3.8 mmol/L (ref 3.5–5.1)
Sodium: 131 mmol/L — ABNORMAL LOW (ref 135–145)

## 2015-07-27 LAB — VANCOMYCIN, TROUGH: Vancomycin Tr: 14 ug/mL (ref 10.0–20.0)

## 2015-07-27 MED ORDER — INSULIN GLARGINE 100 UNIT/ML ~~LOC~~ SOLN
8.0000 [IU] | SUBCUTANEOUS | Status: AC
Start: 2015-07-27 — End: 2015-07-27
  Administered 2015-07-27: 8 [IU] via SUBCUTANEOUS
  Filled 2015-07-27: qty 0.08

## 2015-07-27 MED ORDER — METFORMIN HCL 500 MG PO TABS
1000.0000 mg | ORAL_TABLET | Freq: Two times a day (BID) | ORAL | Status: DC
  Administered 2015-07-27 – 2015-07-29 (×4): 1000 mg via ORAL
  Filled 2015-07-27 (×4): qty 2

## 2015-07-27 MED ORDER — INSULIN ASPART 100 UNIT/ML ~~LOC~~ SOLN
0.0000 [IU] | Freq: Three times a day (TID) | SUBCUTANEOUS | Status: DC
  Administered 2015-07-27: 8 [IU] via SUBCUTANEOUS
  Administered 2015-07-28 – 2015-07-29 (×4): 3 [IU] via SUBCUTANEOUS

## 2015-07-27 MED ORDER — CEFTRIAXONE SODIUM 2 G IJ SOLR
2.0000 g | INTRAMUSCULAR | Status: DC
  Administered 2015-07-27 – 2015-07-29 (×3): 2 g via INTRAVENOUS
  Filled 2015-07-27 (×3): qty 2

## 2015-07-27 MED ORDER — INSULIN GLARGINE 100 UNIT/ML ~~LOC~~ SOLN
20.0000 [IU] | Freq: Every day | SUBCUTANEOUS | Status: DC
  Administered 2015-07-28 – 2015-07-29 (×2): 20 [IU] via SUBCUTANEOUS
  Filled 2015-07-27 (×2): qty 0.2

## 2015-07-27 MED ORDER — INSULIN STARTER KIT- PEN NEEDLES (ENGLISH)
1.0000 | Freq: Once | Status: AC
  Administered 2015-07-28: 1
  Filled 2015-07-27 (×2): qty 1

## 2015-07-27 NOTE — Progress Notes (Signed)
Orthopedic Tech Progress Note Patient Details:  Danny Fuller 11-20-1975 [id number]  Ortho Devices Type of Ortho Device: Postop shoe/boot Ortho Device/Splint Interventions: Application   [name] 07/27/2015, 9:00 AM

## 2015-07-27 NOTE — Care Management Note (Signed)
Case Management Note  Patient Details  Name: MAC DEFORE MRN: [id number] Date of Birth: 07-10-1975  Subjective/Objective:          CM following for progression and d/c planning.          Action/Plan: 07/27/2015 Following for progression, noted that PT eval is recommending outpatient PT to be arranged at the time of MD followup visit. Pt is using crutches therefore no DME needs identified.  Will continue to follow for any needs. Pt has insurance so meds should be covered.   Expected Discharge Date:                  Expected Discharge Plan:  Home/Self Care  In-House Referral:  NA  Discharge planning Services  CM Consult  Post Acute Care Choice:  NA Choice offered to:  Patient  DME Arranged:    DME Agency:     HH Arranged:    [place] Agency:     Status of Service:  In process, will continue to follow  Medicare Important Message Given:    Date Medicare IM Given:    Medicare IM give by:    Date Additional Medicare IM Given:    Additional Medicare Important Message give by:     If discussed at [place] of Stay Meetings, dates discussed:    Additional Comments:  Adron Bene, RN 07/27/2015, 2:04 PM

## 2015-07-27 NOTE — Evaluation (Signed)
Physical Therapy Evaluation Patient Details Name: Danny Fuller MRN: [id number] DOB: 1975-11-25 Today's Date: 07/27/2015   History of Present Illness  HPI: Danny Fuller is a 40 y.o. male with medical history significant diabetes, hypertension, asthma, reflux and chronic headache presents to emergency room with chief complaint of left foot wound and foot swelling. Now s/p L fifth ray amputation  Clinical Impression   Patient is s/p above surgery resulting in functional limitations due to the deficits listed below (see PT Problem List).  Patient will benefit from skilled PT to increase their independence and safety with mobility to allow discharge to the venue listed below.       Follow Up Recommendations Outpatient PT;Supervision - Intermittent  The potential need for Outpatient PT can be addressed at Ortho follow-up appointments.     Equipment Recommendations  None recommended by PT    Recommendations for Other Services       Precautions / Restrictions Restrictions LLE Weight Bearing: Non weight bearing      Mobility  Bed Mobility Overal bed mobility: Modified Independent             General bed mobility comments: Moves slowly and quite hesitant to let L foot off of the bed  Transfers Overall transfer level: Needs assistance Equipment used: Crutches Transfers: Sit to/from Stand Sit to Stand: Min guard         General transfer comment: Cues for hand placement and crutch management  Ambulation/Gait Ambulation/Gait assistance: Min guard Ambulation Distance (Feet): 100 Feet Assistive device: Crutches Gait Pattern/deviations: Step-through pattern     General Gait Details: Excellent maintenance of NWB LLE  Stairs            Wheelchair Mobility    Modified Rankin (Stroke Patients Only)       Balance                                             Pertinent Vitals/Pain Pain Assessment: Faces Faces Pain Scale: Hurts whole  lot Pain Location: L foot when in dependent position Pain Descriptors / Indicators: Aching;Pounding;Pressure;Throbbing Pain Intervention(s): Limited activity within patient's tolerance;Monitored during session;Repositioned    Home Living Family/patient expects to be discharged to:: Private residence Living Arrangements: Spouse/significant other Available Help at Discharge: Family;Available PRN/intermittently Type of Home: House Home Access: Stairs to enter   Entrance Stairs-Number of Steps: 2 Home Layout: One level Home Equipment: None      Prior Function Level of Independence: Independent               Hand Dominance        Extremity/Trunk Assessment   Upper Extremity Assessment: Overall WFL for tasks assessed           Lower Extremity Assessment: LLE deficits/detail   LLE Deficits / Details: HIp, knee WFL; good maintenance of NWB LLE  Cervical / Trunk Assessment: Normal  Communication   Communication: No difficulties  Cognition Arousal/Alertness: Awake/alert Behavior During Therapy: WFL for tasks assessed/performed Overall Cognitive Status: Within Functional Limits for tasks assessed                      General Comments      Exercises        Assessment/Plan    PT Assessment Patient needs continued PT services  PT Diagnosis Difficulty walking;Acute pain   PT Problem  List Decreased activity tolerance;Decreased balance;Decreased mobility;Decreased knowledge of use of DME;Decreased knowledge of precautions;Pain  PT Treatment Interventions DME instruction;Gait training;Stair training;Functional mobility training;Therapeutic activities;Therapeutic exercise;Balance training;Patient/family education   PT Goals (Current goals can be found in the Care Plan section) Acute Rehab PT Goals Patient Stated Goal: heal PT Goal Formulation: With patient Time For Goal Achievement: 08/03/15 Potential to Achieve Goals: Good    Frequency Min 5X/week    Barriers to discharge        Co-evaluation               End of Session Equipment Utilized During Treatment: Gait belt Activity Tolerance: Patient tolerated treatment well Patient left: in chair;with call bell/phone within reach Nurse Communication: Mobility status         Time: 1120-1153 (minus 10 minutes for finding crutches and chaplain visit) PT Time Calculation (min) (ACUTE ONLY): 33 min   Charges:   PT Evaluation $PT Eval Low Complexity: 1 Procedure PT Treatments $Gait Training: 8-22 mins   PT G Codes:        Quin Hoop 07/27/2015, 1:44 PM  Roney Marion, [place] Pager [phone] Office [phone]

## 2015-07-27 NOTE — Progress Notes (Signed)
Pharmacy Antibiotic Note  Danny Fuller is a 40 y.o. male admitted on 07/23/2015 with osteomyelitis. MRI shows osteomyelitis involving the distal head and metatarsal with diffuse cellulitis. S/p 5th ray amputation 6/12. Pharmacy has been consulted for vancomycin dosing. Tmax/24h 100.1, WBC up 15.6, SCr stable, CrCl>100.  Repeat VT today is ~therapeutic (14) on 1500mg  IV q8h.  Plan: - Continue vancomycin 1.5g IV q8h (Goal trough 15-20) - Flagyl 500 mg IV q8h - CTX 2g IV q24h - Continue IV abx 3 days post-op per Ortho, then PO x 72mo - Monitor C&S and renal function  Height: 5\' 10"  (177.8 cm) Weight: 226 lb 8 oz (102.74 kg) IBW/kg (Calculated) : 73  Temp (24hrs), Avg:98.9 F (37.2 C), Min:98 F (36.7 C), Max:100.1 F (37.8 C)   Recent Labs Lab 07/23/15 1731 07/23/15 1743 07/23/15 1843 07/23/15 2116 07/24/15 0433 07/25/15 0502 07/25/15 1750 07/26/15 0406 07/27/15 0325 07/27/15 1205  WBC 17.6*  --   --   --  16.7* 13.7*  --  13.2* 15.6*  --   CREATININE 0.95  --   --   --  0.76 0.61  --  0.60* 0.67  --   LATICACIDVEN  --  2.54* 1.96 1.18  --   --   --   --   --   --   VANCOTROUGH  --   --   --   --   --   --  <4*  --   --  14    Estimated Creatinine Clearance: 150.3 mL/min (by C-G formula based on Cr of 0.67).    No Known Allergies  Antimicrobials this admission: Vanc 6/9 >> Zosyn 6/9>>6/11 Flagyl 6/11>> Cefepime 6/11>>6/13 6/13 CTX>>  Drug Levels: 6/11 VT <0.4 - 1g now then incr to 1.5g q8h 6/13 VT 14 - no change  Microbiology results: 6/9 BCx - ngtd 6/9 UA -  >1000 glucose, 0-5 WBC 6/9 UCx - ngf 6/11 L 5th toe - reinc   Elicia Lamp, PharmD, BCPS Clinical Pharmacist Pager [phone] 07/27/2015 1:30 PM

## 2015-07-27 NOTE — Progress Notes (Signed)
07/27/2015 3:48 PM  Pt does not wish to watch diabetes videos or do any diabetic education at this time, as he states he is sleepy and wants to rest because his foot is hurting.  This RN did request an insulin starter kit, however, since pharmacy is out of the kits this will have to come from the diabetic coordinator tomorrow.  Will continue to attempt diabetic education with patient. Princella Pellegrini

## 2015-07-27 NOTE — Progress Notes (Signed)
Given permission by Donella Stade, [facility] in pharmacy to administer 1200 dose of vancomycin as prescribed (trough results have posted).  Hung per order.  Will monitor. Princella Pellegrini

## 2015-07-27 NOTE — Progress Notes (Signed)
   07/27/15 1100  Clinical Encounter Type  Visited With Patient  Visit Type Spiritual support  Referral From Nurse  Spiritual Encounters  Spiritual Needs Prayer;Emotional  Stress Factors  Patient Stress Factors Health changes;Major life changes  Chaplain visited providing active listening, encouraged compliance with POC r/t dietary needs, emotional support, ministry of presence and prayer. Also provided support and prayer to staff.

## 2015-07-27 NOTE — Progress Notes (Signed)
Inpatient Diabetes Program Recommendations  AACE/ADA: New Consensus Statement on Inpatient Glycemic Control (2015)  Target Ranges:  Prepandial:   less than 140 mg/dL      Peak postprandial:   less than 180 mg/dL (1-2 hours)      Critically ill patients:  140 - 180 mg/dL   Lab Results  Component Value Date   GLUCAP 270* 07/27/2015   HGBA1C 13.7* 07/25/2015   Review of Glycemic Control  Diabetes history: DM 2 Outpatient Diabetes medications: Metformin 1,000 mg BID, Glipizide 10 mg BID Current orders for Inpatient glycemic control: Lantus 20 units, Metformin 1,000 mg BID, Novolog Moderate TID + HS   Spoke with patient about diabetes and home regimen for diabetes control. Patient reports that he is followed by Dr. [name] with [facility] for diabetes management.  Inquired about prior A1C and patient reports that his last A1c was around 13%. Discussed A1C results. Discussed glucose and A1C goals. Discussed importance of checking CBGs and maintaining good CBG control to prevent long-term and short-term complications. 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 He says his PCP has been wanting to start insulin for awhile now. He states that his PCP is not going to be happy with him. Explained how hyperglycemia leads to damage within blood vessels which lead to the common complications seen with uncontrolled diabetes and poor wound healing. Discussed carbohydrates, carbohydrate goals per day and meal, along with portion sizes. Patient takes his own lunch to work and eats mainly healthy foods. He does not eat many sweets or a lot of breads. Encouraged patient to check his glucose during the day patient reports needing a glucose meter at discharge. Patient is willing to go on insulin now for glucose control. Patient feels like he is having a hypoglycemia episode in the 130-140 range. For patient safety on discharge, may want to be more conservative on  bringing glucose down due to the level of hyperglycemia he was at on admission. Patient has children at home he drive around. He is concerned about feeling low and driving with his kids. Consider patient goal on the higher end of normal in the 180's. Patient also requests to have an apple juice during the day to help him have a bowel movement while inpatient. Patient verbalized understanding of information discussed and he states that he has no further questions at this time related to diabetes.  MD at time of discharge please order: Glucose meter kit (order # [id number]) Basal insulin pen Insulin pen needles (order # [id number])  Discussed briefly on how to work an insulin pen. RN to give patient insulin pen from the teaching demo kit and place the insulin pen video on for patient to watch (video # 511) may also want to show vial and syringe method with next administration.   Thanks, Tama Headings RN, MSN, [facility] Inpatient Diabetes Coordinator Team Pager [phone] (8a-5p)

## 2015-07-27 NOTE — Progress Notes (Signed)
PROGRESS NOTE                                                                                                                                                                                                             Patient Demographics:    Danny Fuller, is a 40 y.o. male, DOB - 10/28/1975, [id number]  Admit date - 07/23/2015   Admitting Physician Elwin Mocha, MD  Outpatient Primary MD for the patient is [name], MD  LOS - 4  Chief Complaint  Patient presents with  . Foot Pain       Brief Narrative   40 y.o. male with medical history significant diabetes, hypertension, asthma, reflux and chronic headache presents infected left foot diabetic ulcer, MRI foot significant for osteomyelitis, Seen by ID, and Dr. Sharol Given, status post amputation left fifth ray and abscess drainage on 6/12.  Subjective:    [name] today has, No headache, No chest pain, No abdominal pain , Reports left foot pain is controlled.  Assessment  & Plan :    Principal Problem:   Diabetic foot ulcer ([place]) Active Problems:   Diabetes mellitus without complication ([place])   Essential hypertension   GERD   Asthma   Chronic headache   Cellulitis   Sepsis ([place])   Hyponatremia   Diabetic foot ulcer with osteomyelitis ([place])  Infected diabetic foot ulcer With osteomyelitis - Afebrile, but significant leukocytosis - MRI foot significant for left foot fifth head metatarsal osteomyelitis. - ID consult greatly appreciated, antibiotics per ID, initially on on  IV vancomycin, flagyl and cefepime, position to vancomycin and Rocephin on 6/13, will need 3 days of IV antibiotics postoperatively per orthopedic recommendation, and be transitioned on discharge to oral doxyxycline 100 mg bid and Augmentin bid or keflex qid for 1 month. - orthopedic consult greatly appreciated,  status post amputation left fifth ray and abscess drainage on 6/12 - ABI and Doppler waveforms  within normal limits, discussed with Dr. Donnetta Hutching from vascular surgery, no further workup at this point.   Diabetes mellitus -  remains uncontrolled , increase Lantus from 12-20 units a day, will increase sliding scale from sensitive to moderate, very likely will need insulin on discharge giving very poorly controlled diabetes mellitus on oral agents with A1c of 13.7. - We'll resume on  metformin, continue to hold glipizide.  Hypertension - Continue with lisinopril and amlodipine  Asthma - When necessary nebs  Code Status : Full  Family Communication  : Wife at bedside  Disposition Plan  : will 3 days of IV antibiotics after surgery, anticipated discharge on 6/15  Consults  : ID,orthopedic  Procedures  :  amputation left fifth ray and abscess drainage on 6/12 by Dr. Sharol Given  DVT Prophylaxis  :  Lovenox  Lab Results  Component Value Date   PLT 374 07/27/2015    Antibiotics  :   Anti-infectives    Start     Dose/Rate Route Frequency Ordered Stop   07/27/15 1130  cefTRIAXone (ROCEPHIN) 2 g in dextrose 5 % 50 mL IVPB     2 g 100 mL/hr over 30 Minutes Intravenous Every 24 hours 07/27/15 1034     07/26/15 0400  vancomycin (VANCOCIN) 1,500 mg in sodium chloride 0.9 % 500 mL IVPB     1,500 mg 250 mL/hr over 120 Minutes Intravenous Every 8 hours 07/25/15 1912     07/25/15 1945  vancomycin (VANCOCIN) IVPB 1000 mg/200 mL premix     1,000 mg 200 mL/hr over 60 Minutes Intravenous STAT 07/25/15 1930 07/25/15 2358   07/25/15 1915  vancomycin (VANCOCIN) IVPB 1000 mg/200 mL premix  Status:  Discontinued     1,000 mg 200 mL/hr over 60 Minutes Intravenous STAT 07/25/15 1908 07/25/15 1912   07/25/15 1915  vancomycin (VANCOCIN) 2,000 mg in sodium chloride 0.9 % 500 mL IVPB  Status:  Discontinued     2,000 mg 250 mL/hr over 120 Minutes Intravenous STAT 07/25/15 1912 07/25/15 1930   07/25/15 1400  ceFEPIme (MAXIPIME) 1 g in dextrose 5 % 50 mL IVPB  Status:  Discontinued     1 g 100 mL/hr over  30 Minutes Intravenous Every 8 hours 07/25/15 1150 07/27/15 1034   07/25/15 1200  metroNIDAZOLE (FLAGYL) IVPB 500 mg     500 mg 100 mL/hr over 60 Minutes Intravenous Every 8 hours 07/25/15 1150     07/24/15 0200  piperacillin-tazobactam (ZOSYN) IVPB 3.375 g  Status:  Discontinued     3.375 g 12.5 mL/hr over 240 Minutes Intravenous Every 8 hours 07/23/15 1853 07/25/15 1150   07/24/15 0200  vancomycin (VANCOCIN) IVPB 1000 mg/200 mL premix  Status:  Discontinued     1,000 mg 200 mL/hr over 60 Minutes Intravenous Every 8 hours 07/23/15 1853 07/25/15 1908   07/23/15 1845  vancomycin (VANCOCIN) 2,000 mg in sodium chloride 0.9 % 500 mL IVPB     2,000 mg 250 mL/hr over 120 Minutes Intravenous  Once 07/23/15 1834 07/23/15 2041   07/23/15 1815  piperacillin-tazobactam (ZOSYN) IVPB 3.375 g     3.375 g 100 mL/hr over 30 Minutes Intravenous  Once 07/23/15 1810 07/23/15 1911   07/23/15 1815  vancomycin (VANCOCIN) IVPB 1000 mg/200 mL premix  Status:  Discontinued     1,000 mg 200 mL/hr over 60 Minutes Intravenous  Once 07/23/15 1810 07/23/15 1834        Objective:   Filed Vitals:   07/26/15 1811 07/26/15 2215 07/27/15 0429 07/27/15 0950  BP: 146/84 133/66 154/82 157/84  Pulse: 108 94 105 98  Temp: 98.6 F (37 C) 98.5 F (36.9 C) 100.1 F (37.8 C) 99.2 F (37.3 C)  TempSrc: Oral Oral Oral Oral  Resp: 18 17 17 18   Height:      Weight:   102.74 kg (226 lb 8 oz)   SpO2: 99% 98% 98% 99%  Wt Readings from Last 3 Encounters:  07/27/15 102.74 kg (226 lb 8 oz)  04/23/15 100.699 kg (222 lb)  03/26/15 101.606 kg (224 lb)     Intake/Output Summary (Last 24 hours) at 07/27/15 1311 Last data filed at 07/27/15 1045  Gross per 24 hour  Intake   1620 ml  Output   2720 ml  Net  -1100 ml     Physical Exam  Awake Alert, Oriented X 3, No new F.N deficits, Normal affect [place].AT,PERRAL Supple Neck,No JVD, No cervical lymphadenopathy appriciated.  Symmetrical Chest wall movement, Good air  movement bilaterally, CTAB RRR,No Gallops,Rubs or new Murmurs, No Parasternal Heave +ve B.Sounds, Abd Soft, No tenderness, No organomegaly appriciated, No rebound - guarding or rigidity. Left foot bandaged    Data Review:    CBC  Recent Labs Lab 07/23/15 1731 07/24/15 0433 07/25/15 0502 07/26/15 0406 07/27/15 0325  WBC 17.6* 16.7* 13.7* 13.2* 15.6*  HGB 14.2 11.7* 11.2* 11.6* 11.4*  HCT 39.9 34.2* 33.4* 33.7* 33.2*  PLT 330 302 287 316 374  MCV 84.7 87.2 85.4 84.7 85.3  MCH 30.1 29.8 28.6 29.1 29.3  MCHC 35.6 34.2 33.5 34.4 34.3  RDW 11.8 12.3 12.0 11.7 12.0  LYMPHSABS 2.5  --   --   --   --   MONOABS 1.3*  --   --   --   --   EOSABS 0.0  --   --   --   --   BASOSABS 0.0  --   --   --   --     Chemistries   Recent Labs Lab 07/23/15 1731 07/24/15 0433 07/25/15 0502 07/26/15 0406 07/27/15 0325  NA 128* 132* 131* 131* 131*  K 4.0 4.2 3.8 3.5 3.8  CL 93* 100* 101 99* 99*  CO2 21* 23 22 22 23   GLUCOSE 443* 349* 332* 257* 218*  BUN 10 8 9 6  <5*  CREATININE 0.95 0.76 0.61 0.60* 0.67  CALCIUM 9.6 8.4* 8.6* 8.4* 8.6*  AST 18  --   --   --   --   ALT 14*  --   --   --   --   ALKPHOS 99  --   --   --   --   BILITOT 1.2  --   --   --   --    ------------------------------------------------------------------------------------------------------------------ No results for input(s): CHOL, HDL, LDLCALC, TRIG, CHOLHDL, LDLDIRECT in the last 72 hours.  Lab Results  Component Value Date   HGBA1C 13.7* 07/25/2015   ------------------------------------------------------------------------------------------------------------------ No results for input(s): TSH, T4TOTAL, T3FREE, THYROIDAB in the last 72 hours.  Invalid input(s): FREET3 ------------------------------------------------------------------------------------------------------------------ No results for input(s): VITAMINB12, FOLATE, FERRITIN, TIBC, IRON, RETICCTPCT in the last 72 hours.  Coagulation profile No  results for input(s): INR, PROTIME in the last 168 hours.  No results for input(s): DDIMER in the last 72 hours.  Cardiac Enzymes No results for input(s): CKMB, TROPONINI, MYOGLOBIN in the last 168 hours.  Invalid input(s): CK ------------------------------------------------------------------------------------------------------------------ No results found for: BNP  Inpatient Medications  Scheduled Meds: . amLODipine  5 mg Oral Daily  . cefTRIAXone (ROCEPHIN)  IV  2 g Intravenous Q24H  . enoxaparin (LOVENOX) injection  40 mg Subcutaneous Daily  . insulin aspart  0-15 Units Subcutaneous TID WC  . insulin aspart  0-5 Units Subcutaneous QHS  . [START ON 07/28/2015] insulin glargine  20 Units Subcutaneous Daily  . insulin glargine  8 Units Subcutaneous NOW  . lisinopril  20 mg  Oral Daily  . metronidazole  500 mg Intravenous Q8H  . multivitamin with minerals  1 tablet Oral Daily  . sodium chloride flush  3 mL Intravenous Q12H  . vancomycin  1,500 mg Intravenous Q8H   Continuous Infusions: . sodium chloride    . lactated ringers 10 mL/hr at 07/26/15 1552  . 0.9 % sodium chloride with kcl 125 mL/hr at 07/26/15 1010   PRN Meds:.acetaminophen **OR** acetaminophen, albuterol, famotidine, hydrALAZINE, HYDROcodone-acetaminophen, HYDROmorphone (DILAUDID) injection, methocarbamol **OR** methocarbamol (ROBAXIN)  IV, metoCLOPramide **OR** metoCLOPramide (REGLAN) injection, ondansetron **OR** ondansetron (ZOFRAN) IV, oxyCODONE, polyethylene glycol, traZODone  Micro Results Recent Results (from the past 240 hour(s))  Culture, blood (Routine x 2)     Status: None (Preliminary result)   Collection Time: 07/23/15  5:32 PM  Result Value Ref Range Status   Specimen Description BLOOD RIGHT ANTECUBITAL  Final   Special Requests BOTTLES DRAWN AEROBIC AND ANAEROBIC 5CC  Final   Culture NO GROWTH 3 DAYS  Final   Report Status PENDING  Incomplete  Culture, blood (Routine x 2)     Status: None  (Preliminary result)   Collection Time: 07/23/15  6:34 PM  Result Value Ref Range Status   Specimen Description BLOOD LEFT ANTECUBITAL  Final   Special Requests BOTTLES DRAWN AEROBIC AND ANAEROBIC 5CC  Final   Culture NO GROWTH 3 DAYS  Final   Report Status PENDING  Incomplete  Urine culture     Status: None   Collection Time: 07/23/15  7:25 PM  Result Value Ref Range Status   Specimen Description URINE, CLEAN CATCH  Final   Special Requests NONE  Final   Culture NO GROWTH  Final   Report Status 07/24/2015 FINAL  Final  Aerobic Culture (superficial specimen)     Status: None (Preliminary result)   Collection Time: 07/25/15 11:41 AM  Result Value Ref Range Status   Specimen Description WOUND LEFT TOE  Final   Special Requests L 5TH TOE  Final   Gram Stain   Final    ABUNDANT WBC PRESENT, PREDOMINANTLY PMN NO SQUAMOUS EPITHELIAL CELLS SEEN MODERATE GRAM POSITIVE COCCI IN PAIRS FEW GRAM NEGATIVE COCCOBACILLI    Culture   Final    ABUNDANT GRAM POSITIVE COCCI IDENTIFICATION AND SUSCEPTIBILITIES TO FOLLOW    Report Status PENDING  Incomplete  Surgical pcr screen     Status: None   Collection Time: 07/26/15  2:42 PM  Result Value Ref Range Status   MRSA, PCR NEGATIVE NEGATIVE Final   Staphylococcus aureus NEGATIVE NEGATIVE Final    Comment:        The Xpert SA Assay (FDA approved for NASAL specimens in patients over 7 years of age), is one component of a comprehensive surveillance program.  Test performance has been validated by [facility] for patients greater than or equal to 71 year old. It is not intended to diagnose infection nor to guide or monitor treatment.     Radiology Reports Mr Foot Left W Wo Contrast  07/24/2015  CLINICAL DATA:  Left foot wound and swelling. Weeping at the site of a shaved callus of the foot reportedly along the lateral distal plantar foot. EXAM: MRI OF THE LEFT FOREFOOT WITHOUT AND WITH CONTRAST TECHNIQUE: Multiplanar, multisequence MR  imaging was performed both before and after administration of intravenous contrast. CONTRAST:  46mL MULTIHANCE GADOBENATE DIMEGLUMINE 529 MG/ML IV SOLN COMPARISON:  07/23/2015 radiographs FINDINGS: Osteomyelitis in the head and distal metaphysis of the fifth metatarsal with underlying ulceration along  the lateral plantar subcutaneous tissues, and hypoenhancing tissues extending lateral to the fifth metatarsal and fifth MTP joint suspicious for possible soft tissue necrosis. This tracks dorsal to the lateral metatarsals as shown on images 2 through 7 series 13. There is abnormal edema and enhancement along the plantar musculature of the foot with the exception of the abductor hallucis muscle, this may reflect myositis. Osteomyelitis of the proximal phalanx fifth toe noted. No other osteomyelitis is observed. Marrow edema in the plantar portions of the middle cuneiform and second and third metatarsals likely due to arthropathy. Lisfranc ligament intact. Edema tracks in the dorsal subcutaneous tissues the foot and may reflect cellulitis. IMPRESSION: 1. Osteomyelitis involving the distal head and adjacent metaphysis of the fifth metatarsal, and in the proximal phalanx of the fifth toe. There is adjacent plantar -lateral skin ulceration as well as a region of hypoenhancing tissue lateral to the fifth MTP joint and tracking proximally dorsal to the fourth and fifth metatarsals, potentially reflecting early tissue necrosis. 2. Diffuse regional cellulitis.  Probable plantar myositis. 3. Patchy edema in the midfoot and hindfoot likely due to arthropathy. Electronically Signed   By: Van Clines M.D.   On: 07/24/2015 17:16   Dg Foot Complete Left  07/23/2015  CLINICAL DATA:  Left foot pain edema redness wound lateral plantar surface 2 weeks EXAM: LEFT FOOT - COMPLETE 3+ VIEW COMPARISON:  None FINDINGS: No fracture or dislocation. Moderate soft tissue swelling laterally over the mid to distal fifth metatarsal and  metatarsophalangeal joint with mild soft tissue head origin 80 suggesting edematous change. No underlying periosteal reaction or cortical destruction. Mild diffuse small vessel calcification. Small heel spur. IMPRESSION: Nonspecific soft tissue changes suggesting cellulitis. No definite evidence of osteomyelitis. Electronically Signed   By: Skipper Cliche M.D.   On: 07/23/2015 20:39     [facility], Jaelyne Deeg M.D on 07/27/2015 at 1:11 PM  Between 7am to 7pm - Pager - [phone]  After 7pm go to [email or web address] - password [email or web address]  [facility] -  Office  [phone]

## 2015-07-27 NOTE — Progress Notes (Signed)
Patient ID: Danny Fuller, male   DOB: [birth year], 40 y.o.   MRN: [id number] Postoperative day 1 left foot fifth ray amputation. Patient had a large abscess which involved almost the entire dorsum of his foot. Would recommend 3 days of IV antibiotics postoperatively with discharge to home on oral antibiotics for a month. I'll follow-up in the office in 1 week. Discussed with the patient the he has increased risk of recurrent infection increased risk of the dorsal skin flap developing ischemic changes. Nonweightbearing left lower extremity.

## 2015-07-27 NOTE — Progress Notes (Signed)
[facility] for Infectious Disease   Reason for visit: Follow up on osteomyelitis  Interval History: getting amputation yesterday.  No fever.  Constipated  Physical Exam: Constitutional:  Filed Vitals:   07/27/15 0429 07/27/15 0950  BP: 154/82 157/84  Pulse: 105 98  Temp: 100.1 F (37.8 C) 99.2 F (37.3 C)  Resp: 17 18   patient appears in NAD Respiratory: Normal respiratory effort; CTA B Cardiovascular: RRR MS: left foot wrapped  Review of Systems: Constitutional: negative for fevers and chills Gastrointestinal: negative for diarrhea  Lab Results  Component Value Date   WBC 15.6* 07/27/2015   HGB 11.4* 07/27/2015   HCT 33.2* 07/27/2015   MCV 85.3 07/27/2015   PLT 374 07/27/2015    Lab Results  Component Value Date   CREATININE 0.67 07/27/2015   BUN <5* 07/27/2015   NA 131* 07/27/2015   K 3.8 07/27/2015   CL 99* 07/27/2015   CO2 23 07/27/2015    Lab Results  Component Value Date   ALT 14* 07/23/2015   AST 18 07/23/2015   ALKPHOS 99 07/23/2015     Microbiology: Recent Results (from the past 240 hour(s))  Culture, blood (Routine x 2)     Status: None (Preliminary result)   Collection Time: 07/23/15  5:32 PM  Result Value Ref Range Status   Specimen Description BLOOD RIGHT ANTECUBITAL  Final   Special Requests BOTTLES DRAWN AEROBIC AND ANAEROBIC 5CC  Final   Culture NO GROWTH 3 DAYS  Final   Report Status PENDING  Incomplete  Culture, blood (Routine x 2)     Status: None (Preliminary result)   Collection Time: 07/23/15  6:34 PM  Result Value Ref Range Status   Specimen Description BLOOD LEFT ANTECUBITAL  Final   Special Requests BOTTLES DRAWN AEROBIC AND ANAEROBIC 5CC  Final   Culture NO GROWTH 3 DAYS  Final   Report Status PENDING  Incomplete  Urine culture     Status: None   Collection Time: 07/23/15  7:25 PM  Result Value Ref Range Status   Specimen Description URINE, CLEAN CATCH  Final   Special Requests NONE  Final   Culture NO GROWTH   Final   Report Status 07/24/2015 FINAL  Final  Aerobic Culture (superficial specimen)     Status: None (Preliminary result)   Collection Time: 07/25/15 11:41 AM  Result Value Ref Range Status   Specimen Description WOUND LEFT TOE  Final   Special Requests L 5TH TOE  Final   Gram Stain   Final    ABUNDANT WBC PRESENT, PREDOMINANTLY PMN NO SQUAMOUS EPITHELIAL CELLS SEEN MODERATE GRAM POSITIVE COCCI IN PAIRS FEW GRAM NEGATIVE COCCOBACILLI    Culture CULTURE REINCUBATED FOR BETTER GROWTH  Final   Report Status PENDING  Incomplete  Surgical pcr screen     Status: None   Collection Time: 07/26/15  2:42 PM  Result Value Ref Range Status   MRSA, PCR NEGATIVE NEGATIVE Final   Staphylococcus aureus NEGATIVE NEGATIVE Final    Comment:        The Xpert SA Assay (FDA approved for NASAL specimens in patients over 8 years of age), is one component of a comprehensive surveillance program.  Test performance has been validated by [facility] for patients greater than or equal to 16 year old. It is not intended to diagnose infection nor to guide or monitor treatment.     Impression/Plan:  1. Osteomyelitis - amputation done.  Gross margins reported as good/remaining  tissue viable and healthy.   Agree with continuing IV antibiotics until discharge then oral doxyxycline 100 mg bid and Augmentin bid or keflex qid for 1 month. I will narrow here as well 2. DM - needs good control.   Thanks for consult

## 2015-07-27 NOTE — Progress Notes (Signed)
07/27/2015 3:00 PM  Pt correctly self-administered 10 am lantus dose this am without RN intervention.  Demonstrated appropriate technique, no questions at this time as to how to self-administer insulin.  Danny Fuller

## 2015-07-28 DIAGNOSIS — Z794 Long term (current) use of insulin: Secondary | ICD-10-CM

## 2015-07-28 DIAGNOSIS — I10 Essential (primary) hypertension: Secondary | ICD-10-CM

## 2015-07-28 LAB — CULTURE, BLOOD (ROUTINE X 2)
Culture: NO GROWTH
Culture: NO GROWTH

## 2015-07-28 LAB — GLUCOSE, CAPILLARY
GLUCOSE-CAPILLARY: 159 mg/dL — AB (ref 65–99)
GLUCOSE-CAPILLARY: 181 mg/dL — AB (ref 65–99)
Glucose-Capillary: 241 mg/dL — ABNORMAL HIGH (ref 65–99)
Glucose-Capillary: 190 mg/dL — ABNORMAL HIGH (ref 65–99)
Glucose-Capillary: 191 mg/dL — ABNORMAL HIGH (ref 65–99)

## 2015-07-28 LAB — CBC
HEMATOCRIT: 31.7 % — AB (ref 39.0–52.0)
HEMOGLOBIN: 10.8 g/dL — AB (ref 13.0–17.0)
MCH: 29.6 pg (ref 26.0–34.0)
MCHC: 34.1 g/dL (ref 30.0–36.0)
MCV: 86.8 fL (ref 78.0–100.0)
Platelets: 400 10*3/uL (ref 150–400)
RBC: 3.65 MIL/uL — AB (ref 4.22–5.81)
RDW: 12.3 % (ref 11.5–15.5)
WBC: 14.3 10*3/uL — ABNORMAL HIGH (ref 4.0–10.5)

## 2015-07-28 LAB — BASIC METABOLIC PANEL
Anion gap: 9 (ref 5–15)
BUN: 5 mg/dL — AB (ref 6–20)
CHLORIDE: 100 mmol/L — AB (ref 101–111)
CO2: 23 mmol/L (ref 22–32)
Calcium: 8.6 mg/dL — ABNORMAL LOW (ref 8.9–10.3)
Creatinine, Ser: 0.61 mg/dL (ref 0.61–1.24)
GFR calc Af Amer: >60 mL/min (ref >60)
GFR calc non Af Amer: >60 mL/min (ref >60)
GLUCOSE: 186 mg/dL — AB (ref 65–99)
POTASSIUM: 4.3 mmol/L (ref 3.5–5.1)
Sodium: 132 mmol/L — ABNORMAL LOW (ref 135–145)

## 2015-07-28 LAB — AEROBIC CULTURE  (SUPERFICIAL SPECIMEN)

## 2015-07-28 LAB — AEROBIC CULTURE W GRAM STAIN (SUPERFICIAL SPECIMEN)

## 2015-07-28 NOTE — Progress Notes (Signed)
Physical Therapy Treatment Patient Details Name: Danny Fuller MRN: [id number] DOB: 11/24/1975 Today's Date: 07/28/2015    History of Present Illness HPI: Danny Fuller is a 40 y.o. male with medical history significant diabetes, hypertension, asthma, reflux and chronic headache presents to emergency room with chief complaint of left foot wound and foot swelling. Now s/p L fifth ray amputation    PT Comments    Pain was quite a limiting factor today, but pt willing to work on stair negotiation; Rolled to steps in recliner, then performed steps training; Had received IV pain meds prior to getting up, and it is possible that they affected Danny Fuller's balance while working on stairs today; pt opted not to progressively ambulate due to pain; Would benefit from another PT session while in hospital to continue gait and stair training  Follow Up Recommendations  Outpatient PT;Supervision - Intermittent     Equipment Recommendations  None recommended by PT    Recommendations for Other Services       Precautions / Restrictions Restrictions LLE Weight Bearing: Non weight bearing    Mobility  Bed Mobility Overal bed mobility: Modified Independent             General bed mobility comments: Moves slowly and quite hesitant to let L foot off of the bed  Transfers Overall transfer level: Needs assistance Equipment used: Crutches Transfers: Sit to/from Stand Sit to Stand: Supervision         General transfer comment: Cues for hand placement and crutch management  Ambulation/Gait Ambulation/Gait assistance: Min guard Ambulation Distance (Feet): 10 Feet (recliner to stairs then back to recliner post stair trng) Assistive device: Crutches Gait Pattern/deviations: Step-through pattern     General Gait Details: Excellent maintenance of NWB LLE   Stairs Stairs: Yes Stairs assistance: Min assist Stair Management: No rails;Step to pattern;Forwards;With crutches Number of Stairs:  2 (x2) General stair comments: Cues for technique; at one point, pt's L foot touched down to step, increasing pain, and causing pt to lose his balance forwards; he was abl eto catch himself on the step in front of him without falling; min assist to recover to stnd and continue stair training  Wheelchair Mobility    Modified Rankin (Stroke Patients Only)       Balance Overall balance assessment: Needs assistance             Standing balance comment: Noted his balance was off today compared to yesterday -- this is attributable to having just received IV pain meds                     Cognition Arousal/Alertness: Awake/alert Behavior During Therapy: WFL for tasks assessed/performed Overall Cognitive Status: Within Functional Limits for tasks assessed                      Exercises      General Comments        Pertinent Vitals/Pain Pain Assessment: Faces Faces Pain Scale: Hurts whole lot Pain Location: L foot in dependent position; pain management is difficult as pt is declining meds due to nausea Pain Descriptors / Indicators: Aching Pain Intervention(s): Limited activity within patient's tolerance;Monitored during session;RN gave pain meds during session    Home Living                      Prior Function            PT Goals (current goals  can now be found in the care plan section) Acute Rehab PT Goals Patient Stated Goal: heal PT Goal Formulation: With patient Time For Goal Achievement: 08/03/15 Potential to Achieve Goals: Good Progress towards PT goals: Progressing toward goals    Frequency  Min 5X/week    PT Plan Current plan remains appropriate    Co-evaluation             End of Session Equipment Utilized During Treatment: Gait belt Activity Tolerance: Patient limited by pain Patient left: in chair;with call bell/phone within reach;with family/visitor present     Time: [id number] PT Time Calculation (min) (ACUTE  ONLY): 35 min  Charges:  $Gait Training: 23-37 mins                    G Codes:      Danny Fuller 07/28/2015, 1:33 PM  Danny Fuller, [place] Pager [phone] Office [phone]

## 2015-07-28 NOTE — Progress Notes (Signed)
07/28/2015 12:16 PM  Pt checked his lunch time blood sugar himself, demonstrating proper technique with very little guidance.  Main issue was figuring out how to work the lancet.  Pt verbalized understanding of education done at this time.  Still does not want to watch diabetic videos at this time.  Insulin starter kit ordered from pharmacy.  Will monitor. Princella Pellegrini

## 2015-07-28 NOTE — Progress Notes (Signed)
07/28/2015 9:58 AM  Observed patient correctly give himself his lantus injection this am.  Pt states his sister and his mother are diabetics, so he knows how to draw up insulin and how to administer it, as well as check blood sugars.  I offered to start the diabetes videos for him, however, he declined to watch those at this time.  Will leave video guide at bedside with diabetes videos marked incase he changes his mind.  Will monitor. Princella Pellegrini

## 2015-07-28 NOTE — Progress Notes (Signed)
07/28/2015 12:21 PM  Pt CBG at lunchtime was 159.  Pt is sleepy but arousable, and when awake is oriented fully and answers questions appropriately.  Pt also just received IV pain medication prior to working with PT.  Pt is refusing to take sliding scale insulin coverage at this time, as this CBG is a lower than normal value for him.  Dr. Erlinda Hong notified.  No orders received.  Will continue to monitor. Princella Pellegrini

## 2015-07-28 NOTE — Progress Notes (Signed)
PROGRESS NOTE                                                                                                                                                                                                             Patient Demographics:    Danny Fuller, is a 40 y.o. male, DOB - February 23, 1975, AQT:[id number]  Admit date - 07/23/2015   Admitting Physician Elwin Mocha, MD  Outpatient Primary MD for the patient is [name], MD  LOS - 5  Chief Complaint  Patient presents with  . Foot Pain       Brief Narrative   40 y.o. male with medical history significant diabetes, hypertension, asthma, reflux and chronic headache presents infected left foot diabetic ulcer, MRI foot significant for osteomyelitis, Seen by ID, and Dr. Sharol Given, status post amputation left fifth ray and abscess drainage on 6/12.  Subjective:    Danny Fuller today report feeling better , Reports left foot pain is controlled. Walked with PT  Assessment  & Plan :    Principal Problem:   Diabetic foot ulcer ([place]) Active Problems:   Diabetes mellitus without complication ([place])   Essential hypertension   GERD   Asthma   Chronic headache   Cellulitis   Sepsis ([place])   Hyponatremia   Diabetic foot ulcer with osteomyelitis ([place])  Infected diabetic foot ulcer With osteomyelitis - Afebrile, but significant leukocytosis - MRI foot significant for left foot fifth head metatarsal osteomyelitis. - ID consult greatly appreciated, antibiotics per ID, initially on on  IV vancomycin, flagyl and cefepime, position to vancomycin and Rocephin on 6/13, will need 3 days of IV antibiotics postoperatively per orthopedic recommendation, and be transitioned on discharge to oral doxyxycline 100 mg bid and Augmentin bid or keflex qid for 1 month. - orthopedic consult greatly appreciated,  status post amputation left fifth ray and abscess drainage on 6/12 - ABI and Doppler waveforms within normal  limits, discussed with Dr. Donnetta Hutching from vascular surgery, no further workup at this point.   Insulin dependent Diabetes mellitus -  remains uncontrolled , increase Lantus from 12-20 units a day, will increase sliding scale from sensitive to moderate, very likely will need insulin on discharge giving very poorly controlled diabetes mellitus on oral agents with A1c of 13.7. - We'll resume on  metformin, continue to hold glipizide. -needs  ongoing education  Hypertension - Continue with lisinopril and amlodipine  Asthma - When necessary nebs  Code Status : Full  Family Communication  : patient   Disposition Plan  :  anticipated discharge on 6/15  Consults  : ID,orthopedic  Procedures  :  amputation left fifth ray and abscess drainage on 6/12 by Dr. Sharol Given  DVT Prophylaxis  :  Lovenox  Lab Results  Component Value Date   PLT 400 07/28/2015    Antibiotics  :   Anti-infectives    Start     Dose/Rate Route Frequency Ordered Stop   07/27/15 1130  cefTRIAXone (ROCEPHIN) 2 g in dextrose 5 % 50 mL IVPB     2 g 100 mL/hr over 30 Minutes Intravenous Every 24 hours 07/27/15 1034     07/26/15 0400  vancomycin (VANCOCIN) 1,500 mg in sodium chloride 0.9 % 500 mL IVPB     1,500 mg 250 mL/hr over 120 Minutes Intravenous Every 8 hours 07/25/15 1912     07/25/15 1945  vancomycin (VANCOCIN) IVPB 1000 mg/200 mL premix     1,000 mg 200 mL/hr over 60 Minutes Intravenous STAT 07/25/15 1930 07/25/15 2358   07/25/15 1915  vancomycin (VANCOCIN) IVPB 1000 mg/200 mL premix  Status:  Discontinued     1,000 mg 200 mL/hr over 60 Minutes Intravenous STAT 07/25/15 1908 07/25/15 1912   07/25/15 1915  vancomycin (VANCOCIN) 2,000 mg in sodium chloride 0.9 % 500 mL IVPB  Status:  Discontinued     2,000 mg 250 mL/hr over 120 Minutes Intravenous STAT 07/25/15 1912 07/25/15 1930   07/25/15 1400  ceFEPIme (MAXIPIME) 1 g in dextrose 5 % 50 mL IVPB  Status:  Discontinued     1 g 100 mL/hr over 30 Minutes  Intravenous Every 8 hours 07/25/15 1150 07/27/15 1034   07/25/15 1200  metroNIDAZOLE (FLAGYL) IVPB 500 mg     500 mg 100 mL/hr over 60 Minutes Intravenous Every 8 hours 07/25/15 1150     07/24/15 0200  piperacillin-tazobactam (ZOSYN) IVPB 3.375 g  Status:  Discontinued     3.375 g 12.5 mL/hr over 240 Minutes Intravenous Every 8 hours 07/23/15 1853 07/25/15 1150   07/24/15 0200  vancomycin (VANCOCIN) IVPB 1000 mg/200 mL premix  Status:  Discontinued     1,000 mg 200 mL/hr over 60 Minutes Intravenous Every 8 hours 07/23/15 1853 07/25/15 1908   07/23/15 1845  vancomycin (VANCOCIN) 2,000 mg in sodium chloride 0.9 % 500 mL IVPB     2,000 mg 250 mL/hr over 120 Minutes Intravenous  Once 07/23/15 1834 07/23/15 2041   07/23/15 1815  piperacillin-tazobactam (ZOSYN) IVPB 3.375 g     3.375 g 100 mL/hr over 30 Minutes Intravenous  Once 07/23/15 1810 07/23/15 1911   07/23/15 1815  vancomycin (VANCOCIN) IVPB 1000 mg/200 mL premix  Status:  Discontinued     1,000 mg 200 mL/hr over 60 Minutes Intravenous  Once 07/23/15 1810 07/23/15 1834        Objective:   Filed Vitals:   07/27/15 1621 07/27/15 2020 07/28/15 0553 07/28/15 0813  BP: 137/76 138/75 148/84 132/74  Pulse: 97 102 90 92  Temp: 99 F (37.2 C) 98.7 F (37.1 C) 98.6 F (37 C) 98.6 F (37 C)  TempSrc: Oral Oral Oral Oral  Resp: _0 Height:      Weight:  108.047 kg (238 lb 3.2 oz)    SpO2: 98% 99% 99% 100%    Wt Readings from Last  3 Encounters:  07/27/15 108.047 kg (238 lb 3.2 oz)  04/23/15 100.699 kg (222 lb)  03/26/15 101.606 kg (224 lb)     Intake/Output Summary (Last 24 hours) at 07/28/15 0903 Last data filed at 07/28/15 0644  Gross per 24 hour  Intake 8020.84 ml  Output   1100 ml  Net 6920.84 ml     Physical Exam  Awake Alert, Oriented X 3, No new F.N deficits, Normal affect [place].AT,PERRAL Supple Neck,No JVD, No cervical lymphadenopathy appriciated.  Symmetrical Chest wall movement, Good air movement  bilaterally, CTAB RRR,No Gallops,Rubs or new Murmurs, No Parasternal Heave +ve B.Sounds, Abd Soft, No tenderness, No organomegaly appriciated, No rebound - guarding or rigidity. Left foot bandaged    Data Review:    CBC  Recent Labs Lab 07/23/15 1731 07/24/15 0433 07/25/15 0502 07/26/15 0406 07/27/15 0325 07/28/15 0653  WBC 17.6* 16.7* 13.7* 13.2* 15.6* 14.3*  HGB 14.2 11.7* 11.2* 11.6* 11.4* 10.8*  HCT 39.9 34.2* 33.4* 33.7* 33.2* 31.7*  PLT 330 302 287 316 374 400  MCV 84.7 87.2 85.4 84.7 85.3 86.8  MCH 30.1 29.8 28.6 29.1 29.3 29.6  MCHC 35.6 34.2 33.5 34.4 34.3 34.1  RDW 11.8 12.3 12.0 11.7 12.0 12.3  LYMPHSABS 2.5  --   --   --   --   --   MONOABS 1.3*  --   --   --   --   --   EOSABS 0.0  --   --   --   --   --   BASOSABS 0.0  --   --   --   --   --     Chemistries   Recent Labs Lab 07/23/15 1731 07/24/15 0433 07/25/15 0502 07/26/15 0406 07/27/15 0325 07/28/15 0653  NA 128* 132* 131* 131* 131* 132*  K 4.0 4.2 3.8 3.5 3.8 4.3  CL 93* 100* 101 99* 99* 100*  CO2 21* _0 GLUCOSE 443* 349* 332* 257* 218* 186*  BUN _1 <5* 5*  CREATININE 0.95 0.76 0.61 0.60* 0.67 0.61  CALCIUM 9.6 8.4* 8.6* 8.4* 8.6* 8.6*  AST 18  --   --   --   --   --   ALT 14*  --   --   --   --   --   ALKPHOS 99  --   --   --   --   --   BILITOT 1.2  --   --   --   --   --    ------------------------------------------------------------------------------------------------------------------ No results for input(s): CHOL, HDL, LDLCALC, TRIG, CHOLHDL, LDLDIRECT in the last 72 hours.  Lab Results  Component Value Date   HGBA1C 13.7* 07/25/2015   ------------------------------------------------------------------------------------------------------------------ No results for input(s): TSH, T4TOTAL, T3FREE, THYROIDAB in the last 72 hours.  Invalid input(s):  FREET3 ------------------------------------------------------------------------------------------------------------------ No results for input(s): VITAMINB12, FOLATE, FERRITIN, TIBC, IRON, RETICCTPCT in the last 72 hours.  Coagulation profile No results for input(s): INR, PROTIME in the last 168 hours.  No results for input(s): DDIMER in the last 72 hours.  Cardiac Enzymes No results for input(s): CKMB, TROPONINI, MYOGLOBIN in the last 168 hours.  Invalid input(s): CK ------------------------------------------------------------------------------------------------------------------ No results found for: BNP  Inpatient Medications  Scheduled Meds: . amLODipine  5 mg Oral Daily  . cefTRIAXone (ROCEPHIN)  IV  2 g Intravenous Q24H  . enoxaparin (LOVENOX) injection  40 mg Subcutaneous Daily  . insulin aspart  0-15 Units Subcutaneous TID  WC  . insulin aspart  0-5 Units Subcutaneous QHS  . insulin glargine  20 Units Subcutaneous Daily  . insulin starter kit- pen needles  1 kit Other Once  . lisinopril  20 mg Oral Daily  . metFORMIN  1,000 mg Oral BID WC  . metronidazole  500 mg Intravenous Q8H  . multivitamin with minerals  1 tablet Oral Daily  . sodium chloride flush  3 mL Intravenous Q12H  . vancomycin  1,500 mg Intravenous Q8H   Continuous Infusions: . sodium chloride    . lactated ringers 10 mL/hr at 07/26/15 1552  . 0.9 % sodium chloride with kcl 125 mL/hr at 07/28/15 0126   PRN Meds:.acetaminophen **OR** acetaminophen, albuterol, famotidine, hydrALAZINE, HYDROcodone-acetaminophen, HYDROmorphone (DILAUDID) injection, methocarbamol **OR** methocarbamol (ROBAXIN)  IV, metoCLOPramide **OR** metoCLOPramide (REGLAN) injection, ondansetron **OR** ondansetron (ZOFRAN) IV, oxyCODONE, polyethylene glycol, traZODone  Micro Results Recent Results (from the past 240 hour(s))  Culture, blood (Routine x 2)     Status: None (Preliminary result)   Collection Time: 07/23/15  5:32 PM  Result  Value Ref Range Status   Specimen Description BLOOD RIGHT ANTECUBITAL  Final   Special Requests BOTTLES DRAWN AEROBIC AND ANAEROBIC 5CC  Final   Culture NO GROWTH 4 DAYS  Final   Report Status PENDING  Incomplete  Culture, blood (Routine x 2)     Status: None (Preliminary result)   Collection Time: 07/23/15  6:34 PM  Result Value Ref Range Status   Specimen Description BLOOD LEFT ANTECUBITAL  Final   Special Requests BOTTLES DRAWN AEROBIC AND ANAEROBIC 5CC  Final   Culture NO GROWTH 4 DAYS  Final   Report Status PENDING  Incomplete  Urine culture     Status: None   Collection Time: 07/23/15  7:25 PM  Result Value Ref Range Status   Specimen Description URINE, CLEAN CATCH  Final   Special Requests NONE  Final   Culture NO GROWTH  Final   Report Status 07/24/2015 FINAL  Final  Aerobic Culture (superficial specimen)     Status: None (Preliminary result)   Collection Time: 07/25/15 11:41 AM  Result Value Ref Range Status   Specimen Description WOUND LEFT TOE  Final   Special Requests L 5TH TOE  Final   Gram Stain   Final    ABUNDANT WBC PRESENT, PREDOMINANTLY PMN NO SQUAMOUS EPITHELIAL CELLS SEEN MODERATE GRAM POSITIVE COCCI IN PAIRS FEW GRAM NEGATIVE COCCOBACILLI    Culture   Final    ABUNDANT GRAM POSITIVE COCCI IDENTIFICATION AND SUSCEPTIBILITIES TO FOLLOW    Report Status PENDING  Incomplete  Surgical pcr screen     Status: None   Collection Time: 07/26/15  2:42 PM  Result Value Ref Range Status   MRSA, PCR NEGATIVE NEGATIVE Final   Staphylococcus aureus NEGATIVE NEGATIVE Final    Comment:        The Xpert SA Assay (FDA approved for NASAL specimens in patients over 79 years of age), is one component of a comprehensive surveillance program.  Test performance has been validated by [facility] for patients greater than or equal to 51 year old. It is not intended to diagnose infection nor to guide or monitor treatment.     Radiology Reports Mr Foot Left W Wo  Contrast  07/24/2015  CLINICAL DATA:  Left foot wound and swelling. Weeping at the site of a shaved callus of the foot reportedly along the lateral distal plantar foot. EXAM: MRI OF THE LEFT FOREFOOT WITHOUT AND WITH  CONTRAST TECHNIQUE: Multiplanar, multisequence MR imaging was performed both before and after administration of intravenous contrast. CONTRAST:  43m MULTIHANCE GADOBENATE DIMEGLUMINE 529 MG/ML IV SOLN COMPARISON:  07/23/2015 radiographs FINDINGS: Osteomyelitis in the head and distal metaphysis of the fifth metatarsal with underlying ulceration along the lateral plantar subcutaneous tissues, and hypoenhancing tissues extending lateral to the fifth metatarsal and fifth MTP joint suspicious for possible soft tissue necrosis. This tracks dorsal to the lateral metatarsals as shown on images 2 through 7 series 13. There is abnormal edema and enhancement along the plantar musculature of the foot with the exception of the abductor hallucis muscle, this may reflect myositis. Osteomyelitis of the proximal phalanx fifth toe noted. No other osteomyelitis is observed. Marrow edema in the plantar portions of the middle cuneiform and second and third metatarsals likely due to arthropathy. Lisfranc ligament intact. Edema tracks in the dorsal subcutaneous tissues the foot and may reflect cellulitis. IMPRESSION: 1. Osteomyelitis involving the distal head and adjacent metaphysis of the fifth metatarsal, and in the proximal phalanx of the fifth toe. There is adjacent plantar -lateral skin ulceration as well as a region of hypoenhancing tissue lateral to the fifth MTP joint and tracking proximally dorsal to the fourth and fifth metatarsals, potentially reflecting early tissue necrosis. 2. Diffuse regional cellulitis.  Probable plantar myositis. 3. Patchy edema in the midfoot and hindfoot likely due to arthropathy. Electronically Signed   By: WVan ClinesM.D.   On: 07/24/2015 17:16   Dg Foot Complete  Left  07/23/2015  CLINICAL DATA:  Left foot pain edema redness wound lateral plantar surface 2 weeks EXAM: LEFT FOOT - COMPLETE 3+ VIEW COMPARISON:  None FINDINGS: No fracture or dislocation. Moderate soft tissue swelling laterally over the mid to distal fifth metatarsal and metatarsophalangeal joint with mild soft tissue head origin 80 suggesting edematous change. No underlying periosteal reaction or cortical destruction. Mild diffuse small vessel calcification. Small heel spur. IMPRESSION: Nonspecific soft tissue changes suggesting cellulitis. No definite evidence of osteomyelitis. Electronically Signed   By: RSkipper ClicheM.D.   On: 07/23/2015 20:39     Naidelyn Parrella M.D PhD on 07/28/2015 at 9:03 AM  Between 7am to 7pm - Pager - [phone] After 7pm go to [email or web address] - password [email or web address] -  Office  [phone]

## 2015-07-29 DIAGNOSIS — E669 Obesity, unspecified: Secondary | ICD-10-CM

## 2015-07-29 LAB — BASIC METABOLIC PANEL
ANION GAP: 7 (ref 5–15)
BUN: <5 mg/dL — ABNORMAL LOW (ref 6–20)
CALCIUM: 8.4 mg/dL — AB (ref 8.9–10.3)
CHLORIDE: 100 mmol/L — AB (ref 101–111)
CO2: 26 mmol/L (ref 22–32)
Creatinine, Ser: 0.58 mg/dL — ABNORMAL LOW (ref 0.61–1.24)
GFR calc non Af Amer: >60 mL/min (ref >60)
GLUCOSE: 170 mg/dL — AB (ref 65–99)
POTASSIUM: 4 mmol/L (ref 3.5–5.1)
Sodium: 133 mmol/L — ABNORMAL LOW (ref 135–145)

## 2015-07-29 LAB — CBC
HEMATOCRIT: 31.2 % — AB (ref 39.0–52.0)
HEMOGLOBIN: 10.4 g/dL — AB (ref 13.0–17.0)
MCH: 28.6 pg (ref 26.0–34.0)
MCHC: 33.3 g/dL (ref 30.0–36.0)
MCV: 85.7 fL (ref 78.0–100.0)
Platelets: 440 10*3/uL — ABNORMAL HIGH (ref 150–400)
RBC: 3.64 MIL/uL — AB (ref 4.22–5.81)
RDW: 12.1 % (ref 11.5–15.5)
WBC: 9.1 10*3/uL (ref 4.0–10.5)

## 2015-07-29 LAB — GLUCOSE, CAPILLARY
Glucose-Capillary: 171 mg/dL — ABNORMAL HIGH (ref 65–99)
Glucose-Capillary: 189 mg/dL — ABNORMAL HIGH (ref 65–99)

## 2015-07-29 LAB — MAGNESIUM: Magnesium: 1.7 mg/dL (ref 1.7–2.4)

## 2015-07-29 MED ORDER — BLOOD GLUCOSE MONITOR KIT: PACK | Status: DC

## 2015-07-29 MED ORDER — HYDROCODONE-ACETAMINOPHEN 5-325 MG PO TABS: 1.0000 | ORAL_TABLET | Freq: Four times a day (QID) | ORAL | Status: DC | PRN

## 2015-07-29 MED ORDER — PEN NEEDLES 31G X 6 MM MISC: Status: DC

## 2015-07-29 MED ORDER — INSULIN GLARGINE 100 UNIT/ML SOLOSTAR PEN: 20.0000 [IU] | PEN_INJECTOR | Freq: Every day | SUBCUTANEOUS | Status: DC

## 2015-07-29 MED ORDER — INSULIN ASPART 100 UNIT/ML FLEXPEN: PEN_INJECTOR | SUBCUTANEOUS | Status: DC

## 2015-07-29 MED ORDER — LISINOPRIL 20 MG PO TABS: 20.0000 mg | ORAL_TABLET | Freq: Every day | ORAL | Status: DC

## 2015-07-29 MED ORDER — AMLODIPINE BESYLATE 5 MG PO TABS: 5.0000 mg | ORAL_TABLET | Freq: Every day | ORAL | Status: DC

## 2015-07-29 MED ORDER — AMOXICILLIN-POT CLAVULANATE 875-125 MG PO TABS: 1.0000 | ORAL_TABLET | Freq: Two times a day (BID) | ORAL | Status: DC

## 2015-07-29 MED ORDER — FLORANEX PO TABS: 1.0000 | ORAL_TABLET | Freq: Three times a day (TID) | ORAL | Status: DC

## 2015-07-29 MED ORDER — GLIPIZIDE 10 MG PO TABS: 10.0000 mg | ORAL_TABLET | Freq: Two times a day (BID) | ORAL | Status: DC

## 2015-07-29 MED ORDER — METFORMIN HCL 1000 MG PO TABS: 1000.0000 mg | ORAL_TABLET | Freq: Two times a day (BID) | ORAL | Status: DC

## 2015-07-29 MED ORDER — DOXYCYCLINE HYCLATE 100 MG PO CAPS: 100.0000 mg | ORAL_CAPSULE | Freq: Two times a day (BID) | ORAL | Status: DC

## 2015-07-29 NOTE — Progress Notes (Signed)
Pharmacy Antibiotic Note Danny Fuller is a 40 y.o. male admitted on 07/23/2015 with osteomyelitis. MRI shows osteomyelitis involving the distal head and metatarsal with diffuse cellulitis. S/p 5th ray amputation 6/12. Currently on Ceftriaxone, Flagyl and vancomycin. Per ortho planning 3 days of abx post procedure and then transition to oral.    Plan: 1. Continue vancomycin 1.5g IV q8h (Goal trough 15-20); last VT on 6/13 2. Flagyl 500 mg IV q8h 3. CTX 2g IV q24h 4. F/u transition to oral abx 6/16 or 6/17   Height: 5\' 10"  (177.8 cm) Weight: 239 lb 9.2 oz (108.67 kg) IBW/kg (Calculated) : 73  Temp (24hrs), Avg:98.5 F (36.9 C), Min:98 F (36.7 C), Max:98.9 F (37.2 C)   Recent Labs Lab 07/23/15 1743 07/23/15 1843 07/23/15 2116 07/24/15 0433 07/25/15 0502 07/25/15 1750 07/26/15 0406 07/27/15 0325 07/27/15 1205 07/28/15 0653 07/29/15 0548  WBC  --   --   --  16.7* 13.7*  --  13.2* 15.6*  --  14.3* 9.1  CREATININE  --   --   --  0.76 0.61  --  0.60* 0.67  --  0.61  --   LATICACIDVEN 2.54* 1.96 1.18  --   --   --   --   --   --   --   --   VANCOTROUGH  --   --   --   --   --  <4*  --   --  14  --   --     Estimated Creatinine Clearance: 154.6 mL/min (by C-G formula based on Cr of 0.61).    No Known Allergies  Antimicrobials this admission: Vanc 6/9 >> Zosyn 6/9>>6/11 Flagyl 6/11>> Cefepime 6/11>>6/13 6/13 CTX>>  Drug Levels: 6/11 VT <0.4 - 1g now then incr to 1.5g q8h 6/13 VT 14 - no change  Microbiology results: 6/9 BCx - ngF 6/9 UA -  >1000 glucose, 0-5 WBC 6/9 UCx - ngf 6/11 L 5th toe - CONS and S.agalactiae    Vincenza Hews, PharmD, BCPS 07/29/2015, 8:21 AM Pager: [phone]

## 2015-07-29 NOTE — Progress Notes (Signed)
PT Cancellation Note  Patient Details Name: Danny Fuller MRN: [id number] DOB: January 23, 1976   Cancelled Treatment:    Reason Eval/Treat Not Completed: Patient declined, no reason specified (Tired from poor sleep but has received pain meds).  He is anticipated to go home and will see tomorrow if plans change.   Ramond Dial 07/29/2015, 1:12 PM   Mee Hives, PT MS Acute Rehab Dept. Number: [place] [facility] [place]

## 2015-07-29 NOTE — Progress Notes (Signed)
07/29/2015 3:01 PM  Pt was able to check his blood sugar, draw up his insulin, and self-administer his insulin independently and correctly this am.  States he feels comfortable in performing skill.  Still declines to watch the diabetes videos.    Princella Pellegrini

## 2015-07-29 NOTE — Discharge Summary (Signed)
Discharge Summary  Danny Fuller TYO:[id number] DOB: 03/19/1975  PCP: Danny Morale, MD  Admit date: 07/23/2015 Discharge date: 07/29/2015  Time spent: >73mns  Recommendations for Outpatient Follow-up:  1. F/u with PMD within a week  for hospital discharge follow up, repeat cbc/bmp at follow up, pmd to continue monitor blood sugar control, a1c13. 2. F/u with orthopedics, Danny Fuller Discharge Diagnoses:  Active Hospital Problems   Diagnosis Date Noted  . Diabetic foot ulcer ([facility] 07/24/2015  . Diabetic foot ulcer with osteomyelitis ([facility] 07/26/2015  . Hyponatremia 07/24/2015  . Asthma 07/23/2015  . Chronic headache 07/23/2015  . Cellulitis 07/23/2015  . Sepsis ([facility] 07/23/2015  . GERD 04/21/2008  . Diabetes mellitus without complication ([facility] 174/14/2395 . Essential hypertension 11/21/2006    Resolved Hospital Problems   Diagnosis Date Noted Date Resolved  No resolved problems to display.    Discharge Condition: stable  Diet recommendation: heart healthy/carb modified  Filed Weights   07/27/15 0429 07/27/15 2020 07/29/15 0620  Weight: 102.74 kg (226 lb 8 oz) 108.047 kg (238 lb 3.2 oz) 108.67 kg (239 lb 9.2 oz)    History of present illness:  Chief Complaint: "My foot just got real bad and swollen over the last 2 days."  HPI: Danny SUITis a 40y.o. male with medical history significant diabetes, hypertension, asthma, reflux and chronic headache presents to emergency room with chief complaint of left foot wound and foot swelling.Patient states having about a week ago he was trying to get a callus off of his foot Using a callus shaving device. It appeared to work well.. A few days after doing this he noticed there was some weeping coming from the wound. Patient tried a doctorate at home but this did not work. Patient noticed that he would have swelling in his foot during the day with lots of pain. Patient denies fevers but affirms chills. Patient denies any nausea  vomiting.. Patient denies any rashes. Patient has a recent tetanus shot.  ED Course: patient was found to be septic emergency room. He was bolused fluid and started on vancomycin and Zosyn.hospitalists were called for admission.  Hospital Course:  Principal Problem:   Diabetic foot ulcer ([facility] Active Problems:   Diabetes mellitus without complication ([facility] [place] [facility]   Essential hypertension   GERD   Asthma   Chronic headache   Cellulitis   Sepsis ([facility]   Hyponatremia   Diabetic foot ulcer with osteomyelitis ([facility] [place]   Infected diabetic foot ulcer With osteomyelitis - Afebrile, but significant leukocytosis on presentation - MRI foot significant for left foot fifth head metatarsal osteomyelitis. - ID consult greatly appreciated, antibiotics per ID, initially on on IV vancomycin, flagyl and cefepime, transitioned to vancomycin and Rocephin on 6/13,  3 days of IV antibiotics postoperatively per orthopedic recommendation, and be transitioned on discharge to oral doxyxycline 100 mg bid and Augmentin bid  for 1 month. - orthopedic consult greatly appreciated, status post amputation left fifth ray and abscess drainage on 6/12 - ABI and Doppler waveforms within normal limits, discussed with Danny. EDonnetta Hutchingfrom vascular surgery, no further workup at this point. -wbc normalized at discharge,  -outpatient follow up with ortho in a week, NWB left foot, post op shoe provided. Patient ambulated with PT who recommended outpatient PT   Insulin dependent Diabetes mellitus -very poorly controlled diabetes mellitus on oral agents with A1c of 13.7. Patient was not on insulin prior to admission, he does not have glucometer.  -resume  metformin and glipizide  at discharge, discharge on insulin (lantus 20qd, , novolog ssi) -needs ongoing education  Hypertension - Continue home meds lisinopril and amlodipine  Asthma - When necessary nebs  Obesity: Body mass index is 34.38 kg/(m^2).   Code Status : Full  Family  Communication : patient   Disposition Plan : discharge on 6/15  Consults : ID,orthopedic  Procedures : amputation left fifth ray and abscess drainage on 6/12 by Danny. Sharol Fuller    Discharge Exam: BP 149/83 mmHg  Pulse 88  Temp(Src) 98.7 F (37.1 C) (Oral)  Resp 16  Ht _0  (1.778 m)  Wt 108.67 kg (239 lb 9.2 oz)  BMI 34.38 kg/m2  SpO2 100%  General: aaox3 Cardiovascular: rrr Respiratory: CTABL Extremity: left foot post op changes, dressing intact  Discharge Instructions You were cared for by a hospitalist during your hospital stay. If you have any questions about your discharge medications or the care you received while you were in the hospital after you are discharged, you can call the unit and asked to speak with the hospitalist on call if the hospitalist that took care of you is not available. Once you are discharged, your primary care physician will handle any further medical issues. Please note that NO REFILLS for any discharge medications will be authorized once you are discharged, as it is imperative that you return to your primary care physician (or establish a relationship with a primary care physician if you do not have one) for your aftercare needs so that they can reassess your need for medications and monitor your lab values.      Discharge Instructions    Ambulatory referral to Physical Therapy    Complete by:  As directed      Diet - low sodium heart healthy    Complete by:  As directed   Carb modified     For home use only DME Glucometer    Complete by:  As directed      Increase activity slowly    Complete by:  As directed      Non weight bearing    Complete by:  As directed   Laterality:  left  Extremity:  Lower     Post-op shoe    Complete by:  As directed             Medication List    STOP taking these medications        methocarbamol 500 MG tablet  Commonly known as:  ROBAXIN     sildenafil 100 MG tablet  Commonly known as:  VIAGRA       tadalafil 20 MG tablet  Commonly known as:  CIALIS      TAKE these medications        amLODipine 5 MG tablet  Commonly known as:  NORVASC  Take 1 tablet (5 mg total) by mouth daily.     amoxicillin-clavulanate 875-125 MG tablet  Commonly known as:  AUGMENTIN  Take 1 tablet by mouth 2 (two) times daily.     blood glucose meter kit and supplies Kit  Dispense based on patient and insurance preference. Use up to four times daily as directed. (FOR ICD-9 250.00, 250.01).     doxycycline 100 MG capsule  Commonly known as:  VIBRAMYCIN  Take 1 capsule (100 mg total) by mouth 2 (two) times daily.     glipiZIDE 10 MG tablet  Commonly known as:  GLUCOTROL  Take 1 tablet (10 mg total) by mouth 2 (two) times  daily before a meal.     HYDROcodone-acetaminophen 5-325 MG tablet  Commonly known as:  NORCO/VICODIN  Take 1 tablet by mouth every 6 (six) hours as needed for moderate pain.     insulin aspart 100 UNIT/ML FlexPen  Commonly known as:  NOVOLOG FLEXPEN  Before each meal 3 times a day, 140-199 - 2 units, 200-250 - 4 units, 251-299 - 6 units,  300-349 - 8 units,  350 or above 10 units. Insulin PEN if approved, provide syringes and needles if needed.     Insulin Glargine 100 UNIT/ML Solostar Pen  Commonly known as:  LANTUS  Inject 20 Units into the skin daily at 10 pm.     lactobacilus acidophilus & bulgar Tabs chewable tablet  Take 1 tablet by mouth 3 (three) times daily with meals.     lisinopril 20 MG tablet  Commonly known as:  PRINIVIL,ZESTRIL  Take 1 tablet (20 mg total) by mouth daily.     metFORMIN 1000 MG tablet  Commonly known as:  GLUCOPHAGE  Take 1 tablet (1,000 mg total) by mouth 2 (two) times daily with a meal.     Pen Needles 31G X 6 MM Misc  For a month supply       No Known Allergies Follow-up Information    Follow up with DUDA,MARCUS V, MD In 1 week.   Specialty:  Orthopedic Surgery   Contact information:   [address] [place]  [id number] [phone]       Follow up with FRY,STEPHEN A, MD In 2 weeks.   Specialty:  Family Medicine   Why:  hospital discharge follow up, diabetes control, repeat basic lab works including cbc and bmp at follow up   Contact information:   [address] [place] [address] [phone]        The results of significant diagnostics from this hospitalization (including imaging, microbiology, ancillary and laboratory) are listed below for reference.    Significant Diagnostic Studies: Mr Foot Left W Wo Contrast  07/24/2015  CLINICAL DATA:  Left foot wound and swelling. Weeping at the site of a shaved callus of the foot reportedly along the lateral distal plantar foot. EXAM: MRI OF THE LEFT FOREFOOT WITHOUT AND WITH CONTRAST TECHNIQUE: Multiplanar, multisequence MR imaging was performed both before and after administration of intravenous contrast. CONTRAST:  36m MULTIHANCE GADOBENATE DIMEGLUMINE 529 MG/ML IV SOLN COMPARISON:  07/23/2015 radiographs FINDINGS: Osteomyelitis in the head and distal metaphysis of the fifth metatarsal with underlying ulceration along the lateral plantar subcutaneous tissues, and hypoenhancing tissues extending lateral to the fifth metatarsal and fifth MTP joint suspicious for possible soft tissue necrosis. This tracks dorsal to the lateral metatarsals as shown on images 2 through 7 series 13. There is abnormal edema and enhancement along the plantar musculature of the foot with the exception of the abductor hallucis muscle, this may reflect myositis. Osteomyelitis of the proximal phalanx fifth toe noted. No other osteomyelitis is observed. Marrow edema in the plantar portions of the middle cuneiform and second and third metatarsals likely due to arthropathy. Lisfranc ligament intact. Edema tracks in the dorsal subcutaneous tissues the foot and may reflect cellulitis. IMPRESSION: 1. Osteomyelitis involving the distal head and adjacent metaphysis of the fifth  metatarsal, and in the proximal phalanx of the fifth toe. There is adjacent plantar -lateral skin ulceration as well as a region of hypoenhancing tissue lateral to the fifth MTP joint and tracking proximally dorsal to the fourth and fifth metatarsals,  potentially reflecting early tissue necrosis. 2. Diffuse regional cellulitis.  Probable plantar myositis. 3. Patchy edema in the midfoot and hindfoot likely due to arthropathy. Electronically Signed   By: Van Clines M.D.   On: 07/24/2015 17:16   Dg Foot Complete Left  07/23/2015  CLINICAL DATA:  Left foot pain edema redness wound lateral plantar surface 2 weeks EXAM: LEFT FOOT - COMPLETE 3+ VIEW COMPARISON:  None FINDINGS: No fracture or dislocation. Moderate soft tissue swelling laterally over the mid to distal fifth metatarsal and metatarsophalangeal joint with mild soft tissue head origin 80 suggesting edematous change. No underlying periosteal reaction or cortical destruction. Mild diffuse small vessel calcification. Small heel spur. IMPRESSION: Nonspecific soft tissue changes suggesting cellulitis. No definite evidence of osteomyelitis. Electronically Signed   By: Skipper Cliche M.D.   On: 07/23/2015 20:39    Microbiology: Recent Results (from the past 240 hour(s))  Culture, blood (Routine x 2)     Status: None   Collection Time: 07/23/15  5:32 PM  Result Value Ref Range Status   Specimen Description BLOOD RIGHT ANTECUBITAL  Final   Special Requests BOTTLES DRAWN AEROBIC AND ANAEROBIC 5CC  Final   Culture NO GROWTH 5 DAYS  Final   Report Status 07/28/2015 FINAL  Final  Culture, blood (Routine x 2)     Status: None   Collection Time: 07/23/15  6:34 PM  Result Value Ref Range Status   Specimen Description BLOOD LEFT ANTECUBITAL  Final   Special Requests BOTTLES DRAWN AEROBIC AND ANAEROBIC 5CC  Final   Culture NO GROWTH 5 DAYS  Final   Report Status 07/28/2015 FINAL  Final  Urine culture     Status: None   Collection Time: 07/23/15   7:25 PM  Result Value Ref Range Status   Specimen Description URINE, CLEAN CATCH  Final   Special Requests NONE  Final   Culture NO GROWTH  Final   Report Status 07/24/2015 FINAL  Final  Aerobic Culture (superficial specimen)     Status: None   Collection Time: 07/25/15 11:41 AM  Result Value Ref Range Status   Specimen Description WOUND LEFT TOE  Final   Special Requests L 5TH TOE  Final   Gram Stain   Final    ABUNDANT WBC PRESENT, PREDOMINANTLY PMN NO SQUAMOUS EPITHELIAL CELLS SEEN MODERATE GRAM POSITIVE COCCI IN PAIRS FEW GRAM NEGATIVE COCCOBACILLI    Culture   Final    ABUNDANT STREPTOCOCCUS AGALACTIAE ABUNDANT STAPHYLOCOCCUS SPECIES (COAGULASE NEGATIVE)    Report Status 07/28/2015 FINAL  Final   Organism ID, Bacteria STREPTOCOCCUS AGALACTIAE  Final   Organism ID, Bacteria STAPHYLOCOCCUS SPECIES (COAGULASE NEGATIVE)  Final      Susceptibility   Streptococcus agalactiae - MIC*    CLINDAMYCIN <=0.25 SENSITIVE Sensitive     AMPICILLIN <=0.25 SENSITIVE Sensitive     ERYTHROMYCIN <=0.12 SENSITIVE Sensitive     VANCOMYCIN 0.5 SENSITIVE Sensitive     CEFTRIAXONE <=0.12 SENSITIVE Sensitive     LEVOFLOXACIN 1 SENSITIVE Sensitive     * ABUNDANT STREPTOCOCCUS AGALACTIAE   Staphylococcus species (coagulase negative) - MIC*    CIPROFLOXACIN <=0.5 SENSITIVE Sensitive     ERYTHROMYCIN >=8 RESISTANT Resistant     GENTAMICIN <=0.5 SENSITIVE Sensitive     OXACILLIN >=4 RESISTANT Resistant     TETRACYCLINE 2 SENSITIVE Sensitive     VANCOMYCIN 2 SENSITIVE Sensitive     TRIMETH/SULFA <=10 SENSITIVE Sensitive     CLINDAMYCIN <=0.25 SENSITIVE Sensitive     RIFAMPIN <=0.5  SENSITIVE Sensitive     Inducible Clindamycin NEGATIVE Sensitive     * ABUNDANT STAPHYLOCOCCUS SPECIES (COAGULASE NEGATIVE)  Surgical pcr screen     Status: None   Collection Time: 07/26/15  2:42 PM  Result Value Ref Range Status   MRSA, PCR NEGATIVE NEGATIVE Final   Staphylococcus aureus NEGATIVE NEGATIVE Final     Comment:        The Xpert SA Assay (FDA approved for NASAL specimens in patients over 57 years of age), is one component of a comprehensive surveillance program.  Test performance has been validated by [facility] for patients greater than or equal to 41 year old. It is not intended to diagnose infection nor to guide or monitor treatment.      Labs: Basic Metabolic Panel:  Recent Labs Lab 07/25/15 0502 07/26/15 0406 07/27/15 0325 07/28/15 0653 07/29/15 0548  NA 131* 131* 131* 132* 133*  K 3.8 3.5 3.8 4.3 4.0  CL 101 99* 99* 100* 100*  CO2 _0 GLUCOSE 332* 257* 218* 186* 170*  BUN 9 6 <5* 5* <5*  CREATININE 0.61 0.60* 0.67 0.61 0.58*  CALCIUM 8.6* 8.4* 8.6* 8.6* 8.4*  MG  --   --   --   --  1.7   Liver Function Tests:  Recent Labs Lab 07/23/15 1731  AST 18  ALT 14*  ALKPHOS 99  BILITOT 1.2  PROT 7.9  ALBUMIN 3.7   No results for input(s): LIPASE, AMYLASE in the last 168 hours. No results for input(s): AMMONIA in the last 168 hours. CBC:  Recent Labs Lab 07/23/15 1731  07/25/15 0502 07/26/15 0406 07/27/15 0325 07/28/15 0653 07/29/15 0548  WBC 17.6*  < > 13.7* 13.2* 15.6* 14.3* 9.1  NEUTROABS 13.8*  --   --   --   --   --   --   HGB 14.2  < > 11.2* 11.6* 11.4* 10.8* 10.4*  HCT 39.9  < > 33.4* 33.7* 33.2* 31.7* 31.2*  MCV 84.7  < > 85.4 84.7 85.3 86.8 85.7  PLT 330  < > 287 316 374 400 440*  < > = values in this interval not displayed. Cardiac Enzymes: No results for input(s): CKTOTAL, CKMB, CKMBINDEX, TROPONINI in the last 168 hours. BNP: BNP (last 3 results) No results for input(s): BNP in the last 8760 hours.  ProBNP (last 3 results) No results for input(s): PROBNP in the last 8760 hours.  CBG:  Recent Labs Lab 07/28/15 0744 07/28/15 1139 07/28/15 1713 07/28/15 2141 07/29/15 0754  GLUCAP 190* 159* 181* 191* 171*       Signed:  Qiara Minetti MD, PhD  [facility] 07/29/2015, 9:45 AM

## 2015-07-29 NOTE — Progress Notes (Signed)
07/29/2015 4:44 PM  Reviewed how to use an insulin pen with the patient and his wife.  Reviewed his sliding scale, how to attach the needles, dial the pen, and how to self-inject.  Demonstration was done at the bedside.  Pt and wife verbalized understanding.  IV removed.  Reviewed discharge instructions with patient and his wife, including follow-up appointments, medications, instructions, etc., to which they verbalized understanding.  Belongings packed up and taken downstairs along with patient--wife taking him home.  Princella Pellegrini

## 2015-08-16 ENCOUNTER — Ambulatory Visit (INDEPENDENT_AMBULATORY_CARE_PROVIDER_SITE_OTHER): Payer: BC Managed Care – PPO | Admitting: Family Medicine

## 2015-08-16 ENCOUNTER — Encounter: Payer: Self-pay | Admitting: Family Medicine

## 2015-08-16 VITALS — BP 134/89 | HR 109 | Temp 98.4°F

## 2015-08-16 DIAGNOSIS — E11621 Type 2 diabetes mellitus with foot ulcer: Secondary | ICD-10-CM | POA: Diagnosis not present

## 2015-08-16 DIAGNOSIS — E1169 Type 2 diabetes mellitus with other specified complication: Secondary | ICD-10-CM

## 2015-08-16 DIAGNOSIS — A419 Sepsis, unspecified organism: Secondary | ICD-10-CM | POA: Diagnosis not present

## 2015-08-16 DIAGNOSIS — M869 Osteomyelitis, unspecified: Secondary | ICD-10-CM

## 2015-08-16 DIAGNOSIS — I10 Essential (primary) hypertension: Secondary | ICD-10-CM

## 2015-08-16 DIAGNOSIS — E119 Type 2 diabetes mellitus without complications: Secondary | ICD-10-CM

## 2015-08-16 DIAGNOSIS — L97509 Non-pressure chronic ulcer of other part of unspecified foot with unspecified severity: Secondary | ICD-10-CM

## 2015-08-16 MED ORDER — OXYCODONE-ACETAMINOPHEN 10-325 MG PO TABS: 1.0000 | ORAL_TABLET | Freq: Four times a day (QID) | ORAL | Status: DC | PRN

## 2015-08-16 MED ORDER — METFORMIN HCL 1000 MG PO TABS: 500.0000 mg | ORAL_TABLET | Freq: Two times a day (BID) | ORAL | Status: DC

## 2015-08-16 NOTE — Progress Notes (Signed)
   Subjective:    Patient ID: Danny Fuller, male    DOB: 1975/02/19, 40 y.o.   MRN: [id number]  HPI Here to follow up on several issues after a hospital stay from 07-23-15 to 07-29-15 for sepsis from osteomyelitis of the left fifth toe. This was identified as Strep agalactiae and coagulase negative Staph. He was treated with IV antibiotics and ended up having a toe ray amputation. ABIs and dopplers of both lower legs show normal arterial flow.  His A1c on admission was 13.9. He admitted to paying very little attention to his diet at first, but he is much more conscious of this now. He has stopped taking Metformin and is still on Glipizide. He was started on insulin and he takes Lantus every night along with a Novolog sliding scale. His glucoses are much improved with ranges of 60 to 150 now. He has stopped smoking. His BP is stable. He asks for a stronger pain medication since the 5 mg Norco does not help much.   Review of Systems  Constitutional: Negative.   Respiratory: Negative.   Cardiovascular: Negative.   Skin: Negative.   Neurological: Negative.        Objective:   Physical Exam  Constitutional: He is oriented to person, place, and time. He appears well-developed and well-nourished.  Walking with crutches   Neck: No thyromegaly present.  Cardiovascular: Normal rate, regular rhythm, normal heart sounds and intact distal pulses.   Pulmonary/Chest: Effort normal and breath sounds normal.  Lymphadenopathy:    He has no cervical adenopathy.  Neurological: He is alert and oriented to person, place, and time.          Assessment & Plan:  He is recovering from a toe ray amputation and will follow up with Dr. Sharol Given soon. He will try Percocet for pain relief. His osteomyelitis seems to be resolved. His HTN is stable. As for the diabetes, we will refer him to Endocrine. In the meantime he will stop the Glipizide and resume Metformin by taking 1/2 tab (500 mg ) bid. He will stay on the  current insulin regimen until he sees Endocrine.  [name], MD

## 2015-08-16 NOTE — Progress Notes (Signed)
Pre visit review using our clinic review tool, if applicable. No additional management support is needed unless otherwise documented below in the visit note. Pt unable to weigh 

## 2015-08-25 ENCOUNTER — Telehealth: Payer: Self-pay | Admitting: Family Medicine

## 2015-08-25 NOTE — Telephone Encounter (Signed)
Pt was given a accu chek meter in hospital and needs new rx accu chek aviva plus test strips #100 w/refills send to [facility] [place]

## 2015-08-27 MED ORDER — GLUCOSE BLOOD VI STRP: ORAL_STRIP | Status: DC

## 2015-08-27 NOTE — Telephone Encounter (Signed)
I sent script e-scribe. 

## 2015-09-07 ENCOUNTER — Telehealth: Payer: Self-pay | Admitting: Family Medicine

## 2015-09-07 NOTE — Telephone Encounter (Signed)
I gave him a good supply just a few weeks ago. He needs to follow up with Dr. Sharol Given. I do not want to cover up a potential problem in the toe or foot

## 2015-09-07 NOTE — Telephone Encounter (Signed)
Pt said oxycodone 10 mg is not help  with the toe pain. Pt would like to try Roxicodone 20 mg.

## 2015-09-08 NOTE — Telephone Encounter (Signed)
I spoke with pt and he is going to get in touch with Dr. Sharol Given.

## 2015-09-16 ENCOUNTER — Ambulatory Visit (INDEPENDENT_AMBULATORY_CARE_PROVIDER_SITE_OTHER): Payer: BC Managed Care – PPO | Admitting: Endocrinology

## 2015-09-16 ENCOUNTER — Encounter: Payer: Self-pay | Admitting: Endocrinology

## 2015-09-16 ENCOUNTER — Other Ambulatory Visit: Payer: Self-pay

## 2015-09-16 VITALS — BP 136/84 | HR 88 | Ht 70.0 in | Wt 216.0 lb

## 2015-09-16 DIAGNOSIS — E1142 Type 2 diabetes mellitus with diabetic polyneuropathy: Secondary | ICD-10-CM

## 2015-09-16 DIAGNOSIS — I10 Essential (primary) hypertension: Secondary | ICD-10-CM

## 2015-09-16 DIAGNOSIS — E1165 Type 2 diabetes mellitus with hyperglycemia: Secondary | ICD-10-CM | POA: Diagnosis not present

## 2015-09-16 LAB — GLUCOSE, POCT (MANUAL RESULT ENTRY): POC GLUCOSE: 174 mg/dL — AB (ref 70–99)

## 2015-09-16 MED ORDER — GLUCOSE BLOOD VI STRP: ORAL_STRIP | 2 refills | Status: DC

## 2015-09-16 NOTE — Progress Notes (Signed)
Patient ID: Danny Fuller, male   DOB: [birth year], 40 y.o.   MRN: [id number]            Reason for Appointment: Consultation for Type 2 Diabetes  Referring physician: Sarajane Jews   History of Present Illness:          Date of diagnosis of type 2 diabetes mellitus: 2007        Background history:   At diagnosis he was relatively asymptomatic and was started on metformin and glipizide He has been on the same oral hypoglycemic regimen for several years. 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  Low with taking metformin and has not taken this  He has with the help of his wife changes diet significantly since he had his toe amputation metastatic back on portions, snacks and high fat foods  By recall his blood sugars are relatively low fasting and may be relatively higher at lunchtime and after supper  He says he feels sleepy and weak when his blood sugars are about 110 or below           Side effects from medications have been: None  Compliance with the medical regimen: Improving Hypoglycemia:   none  Glucose monitoring:  done ?  2 times a day         Glucometer:  generic  Blood Glucose readings by time of day by recall   PREMEAL Breakfast Lunch Dinner Bedtime  Overall   Glucose range: 70-110 160 125 160     Median:        Self-care: The diet that the patient has been following is: tries to limit sugars.     Meal times are:  Breakfast is at 10 am : Dinner: 8 pm  Typical meal intake: Breakfast is Usually toast and boiled eggs.  Usually getting cold cut sandwiches at lunch, grilled chicken and vegetables/potatoes at dinner.  Snacks are usually chips, crackers               Dietician visit, most recent: Never               Exercise:  none  Weight history: His highest weight in the past has been 378 am await has been relatively stable this year, slightly lower recently  Wt Readings from Last 3 Encounters:  09/16/15 216 lb (98 kg)  07/29/15 239 lb 9.2 oz (108.7 kg)  04/23/15 222 lb (100.7 kg)    Glycemic control:   Lab Results  Component Value Date   HGBA1C 13.7 (H) 07/25/2015   HGBA1C 13.6 (H) 07/24/2015   HGBA1C 13.5 (H) 07/23/2015   Lab Results  Component Value Date   MICROALBUR 2.9 (H) 07/14/2011   LDLCALC 93 04/23/2013   CREATININE 0.58 (L)  07/29/2015   Lab Results  Component Value Date   MICRALBCREAT 1.9 07/14/2011         Medication List       Accurate as of 09/16/15 11:59 PM. Always use your most recent med list.          blood glucose meter kit and supplies Kit Dispense based on patient and insurance preference. Use up to four times daily as directed. (FOR ICD-9 250.00, 250.01).   gabapentin 300 MG capsule Commonly known as:  NEURONTIN take 1 capsule by mouth at bedtime for 1 week then 1 capsule twic...  (REFER TO PRESCRIPTION NOTES).   glipiZIDE 10 MG tablet Commonly known as:  GLUCOTROL take 1 tablet by mouth twice a day BEFORE A MEAL   glucose blood test strip Commonly known as:  ONETOUCH VERIO Use to check blood sugar 1 time per day.   insulin aspart 100 UNIT/ML FlexPen Commonly known as:  NOVOLOG FLEXPEN Before each meal 3 times a day, 140-199 - 2 units, 200-250 - 4 units, 251-299 - 6 units,  300-349 - 8 units,  350 or above 10 units. Insulin PEN  if approved, provide syringes and needles if needed.   Insulin Glargine 100 UNIT/ML Solostar Pen Commonly known as:  LANTUS Inject 20 Units into the skin daily at 10 pm.   lactobacilus acidophilus & bulgar Tabs chewable tablet Take 1 tablet by mouth 3 (three) times daily with meals.   lisinopril 20 MG tablet Commonly known as:  PRINIVIL,ZESTRIL Take 1 tablet (20 mg total) by mouth daily.   metFORMIN 1000 MG tablet Commonly known as:  GLUCOPHAGE Take 0.5 tablets (500 mg total) by mouth 2 (two) times daily with a meal.   oxyCODONE-acetaminophen 10-325 MG tablet Commonly known as:  PERCOCET Take 1 tablet by mouth every 6 (six) hours as needed for pain.   Pen Needles 31G X 6 MM Misc For a month supply       Allergies: No Known Allergies  Past Medical History:  Diagnosis Date  . Asthma   . Diabetes mellitus   . GERD (gastroesophageal reflux disease)   . Headache(784.0)   . Hypertension     Past Surgical History:  Procedure Laterality Date  . AMPUTATION Left 07/26/2015   Procedure: AMPUTATION LEFT FIFTH RAY;  Surgeon: [name], MD;  Location: [facility];  Service: Orthopedics;  Laterality: Left;  . bilateral hip pins placed      Family History  Problem Relation Age of Onset  . Arthritis    . Diabetes    . Hypertension    . Hyperlipidemia    . Stroke    . Sudden death    . Diabetes Mother   . Diabetes Sister   . Diabetes Brother     Social History:  reports that he has quit smoking. His smoking use included Cigarettes. He has never used smokeless tobacco. He reports that he does not drink alcohol or use drugs.   Review of Systems   Lipid history:     Lab Results  Component Value Date   CHOL 147 04/23/2013   HDL 36.70 (L) 04/23/2013   LDLCALC 93 04/23/2013   TRIG 89.0 04/23/2013   CHOLHDL 4 04/23/2013           Hypertension: On Rx yrs  BP Readings from Last 3 Encounters:  09/16/15 136/84  08/16/15 134/89  07/29/15 (!) 148/83    Most recent  eye exam was yrs  Most recent foot exam:  8/17    LABS:  Office Visit on 09/16/2015  Component Date Value Ref Range Status  . POC Glucose 09/16/2015 174* 70 - 99 mg/dl Final    Physical Examination:  BP 136/84 (BP Location: Left Arm, Patient Position: Sitting, Cuff Size: Normal)   Pulse 88   Ht 5' 10"  (1.778 m)   Wt 216 lb (98 kg)   SpO2 98%   BMI 30.99 kg/m   GENERAL:         Patient has generalized obesity.   HEENT:         Eye exam shows normal external appearance. Fundus exam shows no retinopathy. Oral exam shows normal mucosa .  NECK:   There is no lymphadenopathy Thyroid is not enlarged and no nodules felt.  Carotids are normal to palpation and no bruit heard LUNGS:         Chest is symmetrical. Lungs are clear to auscultation.Marland Kitchen   HEART:         Heart sounds:  S1 and S2 are normal. No murmur or click heard., no S3 or S4.   ABDOMEN:   There is no distention present. Liver and spleen are not palpable. No other mass or tenderness present.   NEUROLOGICAL:   Ankle jerks are absent bilaterally.    Diabetic Foot Exam - Simple   Simple Foot Form Diabetic Foot exam was performed with the following findings:  Yes   Visual Inspection See comments:  Yes Sensation Testing Intact to touch and monofilament testing bilaterally:  Yes Pulse Check Posterior Tibialis and Dorsalis pulse intact bilaterally:  Yes Comments Only right foot examination done, left foot bandaged postoperatively            Vibration sense is Moderately reduced in distal first toes, tested on the right. MUSCULOSKELETAL:  There is no swelling or deformity of the peripheral joints. Spine is normal to inspection.   EXTREMITIES:     There is no edema. No skin lesions present.Marland Kitchen SKIN:       No rash or lesions of concern.        ASSESSMENT:  Diabetes type 2, uncontrolled With BMI 31 He has had long-standing diabetes with markedly increased A1c consistently for several years, more recently over 13% Although he  had been severely obese in the past his weight is better more recently With his recent treatment with insulin he appears to be having significantly better blood sugar control and also has changed his dietary habits significantly Currently taking only 20 units of basal insulin he has low normal blood sugars reportedly in the mornings    Complications of diabetes: Amputation of toe, Neuropathy  Currently having painful neuropathy with inadequate control of symptoms and is taking only 300 mg a day  HYPERTENSION: Blood pressure is persistently high on 20 mg of lisinopril alone No peripheral edema  PLAN:     Since he is afraid of hypoglycemia and appears to be significantly better blood sugars now with relief of glucose toxicity and improving his diet significantly well see if he can taper off his insulin  He will start back on metformin 1 g twice a day  Start taking 10 units of Lantus instead of 20 for now and given him a titration she to adjust the dose based on his fasting blood sugar every 3 days.  For now will continue glipizide but consider using a GLP-1 drug for consistent control and avoiding potentially hypoglycemia.  This may also facilitate stopping insulin  Consultation with dietitian  He will start using a brand name glucose monitor using the One Touch Verio that can be downloaded  Discussed when to check his blood sugars including some readings after meals  Discussed blood sugar targets at various times but since he is dramatic with low normal blood sugars he will target his fasting readings 120-140 for now  Increase gabapentin to as much as 1200 mg a day for symptomatic relief  For his blood pressure he will increase lisinopril to 40 mg  Counseling time on subjects discussed above is over 50% of today's 60 minute visit   Patient Instructions  Check blood sugars on waking up  daily  Also check blood sugars about 2 hours after a meal and do this after different meals by  rotation  Recommended blood sugar levels on waking up is 90-130 and about 2 hours after meal is 130-160  Please bring your blood sugar monitor to each visit, thank you  Metformin 1 pill twice daily  Lantus 10, adjust as told  Lisinopril 2 daily  Gabapentin upto 4 daily     Claritza July 09/17/2015, 8:41 AM   Note: This office note was prepared with Estate agent. Any transcriptional errors that result from this process are unintentional.

## 2015-09-16 NOTE — Patient Instructions (Addendum)
Check blood sugars on waking up  daily  Also check blood sugars about 2 hours after a meal and do this after different meals by rotation  Recommended blood sugar levels on waking up is 90-130 and about 2 hours after meal is 130-160  Please bring your blood sugar monitor to each visit, thank you  Metformin 1 pill twice daily  Lantus 10, adjust as told  Lisinopril 2 daily  Gabapentin upto 4 daily

## 2015-10-08 ENCOUNTER — Encounter: Payer: BC Managed Care – PPO | Admitting: Dietician

## 2015-10-10 ENCOUNTER — Encounter (HOSPITAL_COMMUNITY): Payer: Self-pay | Admitting: *Deleted

## 2015-10-10 ENCOUNTER — Inpatient Hospital Stay (HOSPITAL_COMMUNITY)
Admission: EM | Admit: 2015-10-10 | Discharge: 2015-10-15 | DRG: 863 | Disposition: A | Discharge status: Home Health | Payer: BC Managed Care – PPO | Attending: Internal Medicine | Admitting: Internal Medicine

## 2015-10-10 ENCOUNTER — Emergency Department (HOSPITAL_COMMUNITY): Payer: BC Managed Care – PPO

## 2015-10-10 DIAGNOSIS — K219 Gastro-esophageal reflux disease without esophagitis: Secondary | ICD-10-CM | POA: Diagnosis present

## 2015-10-10 DIAGNOSIS — E1169 Type 2 diabetes mellitus with other specified complication: Secondary | ICD-10-CM | POA: Diagnosis present

## 2015-10-10 DIAGNOSIS — N189 Chronic kidney disease, unspecified: Secondary | ICD-10-CM | POA: Diagnosis present

## 2015-10-10 DIAGNOSIS — E1140 Type 2 diabetes mellitus with diabetic neuropathy, unspecified: Secondary | ICD-10-CM | POA: Diagnosis present

## 2015-10-10 DIAGNOSIS — Z89422 Acquired absence of other left toe(s): Secondary | ICD-10-CM

## 2015-10-10 DIAGNOSIS — E1165 Type 2 diabetes mellitus with hyperglycemia: Secondary | ICD-10-CM | POA: Diagnosis present

## 2015-10-10 DIAGNOSIS — Z794 Long term (current) use of insulin: Secondary | ICD-10-CM

## 2015-10-10 DIAGNOSIS — T148XXA Other injury of unspecified body region, initial encounter: Secondary | ICD-10-CM

## 2015-10-10 DIAGNOSIS — L089 Local infection of the skin and subcutaneous tissue, unspecified: Secondary | ICD-10-CM

## 2015-10-10 DIAGNOSIS — S91302A Unspecified open wound, left foot, initial encounter: Secondary | ICD-10-CM | POA: Diagnosis present

## 2015-10-10 DIAGNOSIS — Z6832 Body mass index (BMI) 32.0-32.9, adult: Secondary | ICD-10-CM

## 2015-10-10 DIAGNOSIS — Z87891 Personal history of nicotine dependence: Secondary | ICD-10-CM

## 2015-10-10 DIAGNOSIS — M869 Osteomyelitis, unspecified: Secondary | ICD-10-CM | POA: Diagnosis present

## 2015-10-10 DIAGNOSIS — N179 Acute kidney failure, unspecified: Secondary | ICD-10-CM | POA: Diagnosis present

## 2015-10-10 DIAGNOSIS — Z79899 Other long term (current) drug therapy: Secondary | ICD-10-CM

## 2015-10-10 DIAGNOSIS — Z833 Family history of diabetes mellitus: Secondary | ICD-10-CM

## 2015-10-10 DIAGNOSIS — L03116 Cellulitis of left lower limb: Secondary | ICD-10-CM | POA: Diagnosis present

## 2015-10-10 DIAGNOSIS — I129 Hypertensive chronic kidney disease with stage 1 through stage 4 chronic kidney disease, or unspecified chronic kidney disease: Secondary | ICD-10-CM | POA: Diagnosis present

## 2015-10-10 DIAGNOSIS — E669 Obesity, unspecified: Secondary | ICD-10-CM | POA: Diagnosis present

## 2015-10-10 DIAGNOSIS — T814XXA Infection following a procedure, initial encounter: Principal | ICD-10-CM | POA: Diagnosis present

## 2015-10-10 DIAGNOSIS — A4901 Methicillin susceptible Staphylococcus aureus infection, unspecified site: Secondary | ICD-10-CM | POA: Diagnosis present

## 2015-10-10 DIAGNOSIS — I10 Essential (primary) hypertension: Secondary | ICD-10-CM | POA: Diagnosis present

## 2015-10-10 DIAGNOSIS — E1122 Type 2 diabetes mellitus with diabetic chronic kidney disease: Secondary | ICD-10-CM | POA: Diagnosis present

## 2015-10-10 DIAGNOSIS — E11628 Type 2 diabetes mellitus with other skin complications: Secondary | ICD-10-CM | POA: Diagnosis present

## 2015-10-10 LAB — CBC WITH DIFFERENTIAL/PLATELET
Basophils Absolute: 0.1 10*3/uL (ref 0.0–0.1)
Basophils Relative: 1 %
EOS ABS: 0.1 10*3/uL (ref 0.0–0.7)
Eosinophils Relative: 1 %
HCT: 40.8 % (ref 39.0–52.0)
HEMOGLOBIN: 13.9 g/dL (ref 13.0–17.0)
LYMPHS ABS: 4 10*3/uL (ref 0.7–4.0)
Lymphocytes Relative: 48 %
MCH: 29.5 pg (ref 26.0–34.0)
MCHC: 34.1 g/dL (ref 30.0–36.0)
MCV: 86.6 fL (ref 78.0–100.0)
MONO ABS: 0.6 10*3/uL (ref 0.1–1.0)
MONOS PCT: 7 %
NEUTROS PCT: 43 %
Neutro Abs: 3.6 10*3/uL (ref 1.7–7.7)
Platelets: 340 10*3/uL (ref 150–400)
RBC: 4.71 MIL/uL (ref 4.22–5.81)
RDW: 13.4 % (ref 11.5–15.5)
WBC: 8.3 10*3/uL (ref 4.0–10.5)

## 2015-10-10 LAB — COMPREHENSIVE METABOLIC PANEL
ALK PHOS: 83 U/L (ref 38–126)
ALT: 16 U/L — ABNORMAL LOW (ref 17–63)
ANION GAP: 9 (ref 5–15)
AST: 16 U/L (ref 15–41)
Albumin: 4.2 g/dL (ref 3.5–5.0)
BILIRUBIN TOTAL: 1.2 mg/dL (ref 0.3–1.2)
BUN: 10 mg/dL (ref 6–20)
CALCIUM: 9.1 mg/dL (ref 8.9–10.3)
CO2: 23 mmol/L (ref 22–32)
Chloride: 107 mmol/L (ref 101–111)
Creatinine, Ser: 0.82 mg/dL (ref 0.61–1.24)
GFR calc non Af Amer: >60 mL/min (ref >60)
Glucose, Bld: 136 mg/dL — ABNORMAL HIGH (ref 65–99)
Potassium: 4 mmol/L (ref 3.5–5.1)
SODIUM: 139 mmol/L (ref 135–145)
TOTAL PROTEIN: 7.8 g/dL (ref 6.5–8.1)

## 2015-10-10 LAB — C-REACTIVE PROTEIN: CRP: 0.6 mg/dL (ref <1.0)

## 2015-10-10 MED ORDER — VANCOMYCIN HCL 10 G IV SOLR
2000.0000 mg | INTRAVENOUS | Status: AC
  Administered 2015-10-10: 2000 mg via INTRAVENOUS
  Filled 2015-10-10: qty 2000

## 2015-10-10 MED ORDER — PIPERACILLIN-TAZOBACTAM 3.375 G IVPB 30 MIN
3.3750 g | Freq: Once | INTRAVENOUS | Status: AC
  Administered 2015-10-10: 3.375 g via INTRAVENOUS
  Filled 2015-10-10: qty 50

## 2015-10-10 NOTE — ED Provider Notes (Signed)
[facility] DEPT Provider Note   CSN: [id number] Arrival date & time: 10/10/15  1935     History   Chief Complaint Chief Complaint  Patient presents with  . Foot Pain    HPI Danny Fuller is a 40 y.o. male.  Danny Fuller is a 40 y.o. Male who presents to the emergency department complaining of low wound infection to his left foot following a left fifth toe amputation in June. Patient reports he had his left fifth toe amputated in 07/23/15 by Dr. Sharol Given. He reports over the past 3 weeks he has had increasing pain and discharge from his left foot. He was seen in the orthopedic surgeon's office a few weeks ago and started on doxycycline. He reports he just started doxycycline one week ago and is still has several doses left. He reports this has not been helping his symptoms, and in fact it has worsened. He reports malodor and discharge from his left foot. He reports pain to his foot that radiates up into his thigh. He reports this is different from his neuropathic pain. He reports having some chills over the past several days but no known fever. Reports a loose stool today. He reports difficulty controlling his blood sugars recently. He is insulin-dependent. Patient denies fevers, abdominal pain, vomiting, coughing, chest pain, shortness of breath.    The history is provided by the patient and medical records. No language interpreter was used.    Past Medical History:  Diagnosis Date  . Asthma   . Diabetes mellitus   . GERD (gastroesophageal reflux disease)   . Headache(784.0)   . Hypertension     Patient Active Problem List   Diagnosis Date Noted  . Wound infection ([place]) 10/11/2015  . Diabetic foot ulcer with osteomyelitis ([place]) 07/26/2015  . Diabetic foot ulcer ([place]) 07/24/2015  . Hyponatremia 07/24/2015  . Asthma 07/23/2015  . Chronic headache 07/23/2015  . Cellulitis 07/23/2015  . Sepsis ([place]) 07/23/2015  . Erectile disorder due to medical condition in male  04/23/2015  . BMI 33.0-33.9,adult 04/21/2012  . ASTHMA 04/21/2008  . GERD 04/21/2008  . HEADACHE 04/21/2008  . Diabetes mellitus without complication ([place]) 76/80/8811  . Essential hypertension 11/21/2006  . LUMBAR STRAIN 11/21/2006    Past Surgical History:  Procedure Laterality Date  . AMPUTATION Left 07/26/2015   Procedure: AMPUTATION LEFT FIFTH RAY;  Surgeon: [name], MD;  Location: [facility];  Service: Orthopedics;  Laterality: Left;  . bilateral hip pins placed         Home Medications    Prior to Admission medications   Medication Sig Start Date End Date Taking? Authorizing Provider  blood glucose meter kit and supplies KIT Dispense based on patient and insurance preference. Use up to four times daily as directed. (FOR ICD-9 250.00, 250.01). 07/29/15   [name], MD  gabapentin (NEURONTIN) 300 MG capsule take 1 capsule by mouth at bedtime for 1 week then 1 capsule twic...  (REFER TO PRESCRIPTION NOTES). 09/07/15   Historical Provider, MD  glipiZIDE (GLUCOTROL) 10 MG tablet take 1 tablet by mouth twice a day BEFORE A MEAL 07/29/15   Historical Provider, MD  glucose blood (ONETOUCH VERIO) test strip Use to check blood sugar 1 time per day. 09/16/15   [name], MD  insulin aspart (NOVOLOG FLEXPEN) 100 UNIT/ML FlexPen Before each meal 3 times a day, 140-199 - 2 units, 200-250 - 4 units, 251-299 - 6 units,  300-349 - 8 units,  350 or above  10 units. Insulin PEN if approved, provide syringes and needles if needed. Patient not taking: Reported on 09/16/2015 07/29/15   [name], MD  Insulin Glargine (LANTUS) 100 UNIT/ML Solostar Pen Inject 20 Units into the skin daily at 10 pm. 07/29/15   [name], MD  Insulin Pen Needle (PEN NEEDLES) 31G X 6 MM MISC For a month supply 07/29/15   [name], MD  lactobacilus acidophilus & bulgar [facility]) TABS chewable tablet Take 1 tablet by mouth 3 (three) times daily with meals. Patient not taking: Reported on 08/16/2015 07/29/15   [name], MD  lisinopril  (PRINIVIL,ZESTRIL) 20 MG tablet Take 1 tablet (20 mg total) by mouth daily. 07/29/15   [name], MD  metFORMIN (GLUCOPHAGE) 1000 MG tablet Take 0.5 tablets (500 mg total) by mouth 2 (two) times daily with a meal. Patient not taking: Reported on 09/16/2015 08/16/15   [name], MD  oxyCODONE-acetaminophen (PERCOCET) 10-325 MG tablet Take 1 tablet by mouth every 6 (six) hours as needed for pain. 08/16/15   [name], MD    Family History Family History  Problem Relation Age of Onset  . Arthritis    . Diabetes    . Hypertension    . Hyperlipidemia    . Stroke    . Sudden death    . Diabetes Mother   . Diabetes Sister   . Diabetes Brother     Social History Social History  Substance Use Topics  . Smoking status: Former Smoker    Types: Cigarettes  . Smokeless tobacco: Never Used     Comment: quit 5 days ago  . Alcohol use No     Allergies   Review of patient's allergies indicates no known allergies.   Review of Systems Review of Systems  Constitutional: Positive for chills. Negative for fever.  HENT: Negative for congestion and sore throat.   Eyes: Negative for visual disturbance.  Respiratory: Negative for cough and shortness of breath.   Cardiovascular: Negative for chest pain.  Gastrointestinal: Positive for diarrhea. Negative for abdominal pain, nausea and vomiting.  Genitourinary: Negative for dysuria.  Musculoskeletal: Positive for arthralgias. Negative for back pain and neck pain.  Skin: Positive for color change, rash and wound.  Neurological: Negative for headaches.     Physical Exam Updated Vital Signs BP (!) 158/103   Pulse 83   Temp 97.7 F (36.5 C) (Oral)   Resp 18   Ht _0  (1.753 m)   Wt 100.4 kg   SpO2 100%   BMI 32.68 kg/m   Physical Exam  Constitutional: He appears well-developed and well-nourished. No distress.  HENT:  Head: Normocephalic and atraumatic.  Mouth/Throat: Oropharynx is clear and moist.  Eyes: Conjunctivae are normal.  Pupils are equal, round, and reactive to light. Right eye exhibits no discharge. Left eye exhibits no discharge.  Neck: Neck supple.  Cardiovascular: Normal rate, regular rhythm, normal heart sounds and intact distal pulses.  Exam reveals no gallop and no friction rub.   No murmur heard. Pulmonary/Chest: Effort normal and breath sounds normal. No respiratory distress. He has no wheezes. He has no rales.  Abdominal: Soft. There is no tenderness.  Musculoskeletal: He exhibits tenderness. He exhibits no edema.  Patient has a large open wound to his left lateral foot. There is malodorous discharge and surrounding erythema. He reports pain with palpation. Good capillary refill to his left toes. See attached picture.   Lymphadenopathy:    He has no cervical adenopathy.  Neurological: He is alert. Coordination normal.  Skin: Skin is warm and dry. Rash noted. He is not diaphoretic. No erythema. No pallor.  See musc and picture.   Psychiatric: He has a normal mood and affect. His behavior is normal.  Nursing note and vitals reviewed.      ED Treatments / Results  Labs (all labs ordered are listed, but only abnormal results are displayed) Labs Reviewed  COMPREHENSIVE METABOLIC PANEL - Abnormal; Notable for the following:       Result Value   Glucose, Bld 136 (*)    ALT 16 (*)    All other components within normal limits  AEROBIC CULTURE (SUPERFICIAL SPECIMEN)  CULTURE, BLOOD (ROUTINE X 2)  CULTURE, BLOOD (ROUTINE X 2)  CBC WITH DIFFERENTIAL/PLATELET  SEDIMENTATION RATE  C-REACTIVE PROTEIN    EKG  EKG Interpretation None       Radiology Dg Foot Complete Left  Result Date: 10/10/2015 CLINICAL DATA:  Pain around the amputated region of left fifth metatarsal for 4 days. Bad smell and pus. EXAM: LEFT FOOT - COMPLETE 3+ VIEW COMPARISON:  MRI left foot 07/24/2015.  Left foot 07/23/2015 FINDINGS: Since the previous study, there has been interval amputation of the left fifth ray at the  level of the proximal metatarsal. Margin of resection appears intact without evidence of significant erosion or cortical loss. Soft tissue swelling around the left forefoot at the metatarsal phalangeal region. No radiopaque soft tissue foreign bodies or gas collections. No acute fractures identified. Vascular calcifications. IMPRESSION: Soft tissue swelling. Interval amputation of the left fifth toe at the level of the proximal metatarsal. No acute bony abnormalities. Electronically Signed   By: Lucienne Capers M.D.   On: 10/10/2015 20:52    Procedures Procedures (including critical care time)  Medications Ordered in ED Medications  vancomycin (VANCOCIN) 2,000 mg in sodium chloride 0.9 % 500 mL IVPB (2,000 mg Intravenous New Bag/Given 10/10/15 2340)  piperacillin-tazobactam (ZOSYN) IVPB 3.375 g (0 g Intravenous Stopped 10/11/15 0010)     Initial Impression / Assessment and Plan / ED Course  I have reviewed the triage vital signs and the nursing notes.  Pertinent labs & imaging results that were available during my care of the patient were reviewed by me and considered in my medical decision making (see chart for details).  Clinical Course   This  is a 40 y.o. Male who presents to the emergency department complaining of low wound infection to his left foot following a left fifth toe amputation in June. Patient reports he had his left fifth toe amputated in 07/23/15 by Dr. Sharol Given. He reports over the past 3 weeks he has had increasing pain and discharge from his left foot. He was seen in the orthopedic surgeon's office a few weeks ago and started on doxycycline. He reports he just started doxycycline one week ago and is still has several doses left. He reports this has not been helping his symptoms, and in fact it has worsened. He reports malodor and discharge from his left foot. He reports pain to his foot that radiates up into his thigh. He reports this is different from his neuropathic pain. He  reports having some chills over the past several days but no known fever. On exam the patient is afebrile nontoxic appearing. Patient has large wound to left lateral foot that is draining purulent material. It is very malodorous. Concern for possible osteomyelitis. No white count on CBC. CMP is unremarkable. Plain films indicate soft tissue swelling  but no acute bony abnormality. As the patient has failed outpatient treatment with oral doxycycline will start on Zosyn and vancomycin. MRI foot ordered to rule out osteomyelitis.  MRI pending. Will consult for admission for continued IV antibiotics. Patient is in agreement with plan for admission.   I consulted with Dr. Loleta Books who accepted the patient and requested temporary orders for med surg bed observation.    Final Clinical Impressions(s) / ED Diagnoses   Final diagnoses:  Foot infection  Wound infection [facility])    New Prescriptions New Prescriptions   No medications on file     Waynetta Pean, PA-C 10/11/15 0101    Charlesetta Shanks, MD 11/02/15 1827

## 2015-10-10 NOTE — ED Triage Notes (Signed)
Patient presents stating he had a toe amputation on the left foot in June and has been changing the dressing.  Did notice an odor and seen by MD and given Doxy.  Today noticed the odor is stronger and yellowish gray drainage.  Also has been having pain in the leg which is different from the neuropathy that he has

## 2015-10-11 ENCOUNTER — Encounter (HOSPITAL_COMMUNITY): Payer: Self-pay | Admitting: Family Medicine

## 2015-10-11 ENCOUNTER — Observation Stay (HOSPITAL_COMMUNITY): Payer: BC Managed Care – PPO

## 2015-10-11 ENCOUNTER — Other Ambulatory Visit: Payer: BC Managed Care – PPO

## 2015-10-11 DIAGNOSIS — Z79899 Other long term (current) drug therapy: Secondary | ICD-10-CM | POA: Diagnosis not present

## 2015-10-11 DIAGNOSIS — N189 Chronic kidney disease, unspecified: Secondary | ICD-10-CM | POA: Diagnosis present

## 2015-10-11 DIAGNOSIS — N179 Acute kidney failure, unspecified: Secondary | ICD-10-CM | POA: Diagnosis present

## 2015-10-11 DIAGNOSIS — E11621 Type 2 diabetes mellitus with foot ulcer: Secondary | ICD-10-CM | POA: Diagnosis not present

## 2015-10-11 DIAGNOSIS — M86172 Other acute osteomyelitis, left ankle and foot: Secondary | ICD-10-CM | POA: Diagnosis not present

## 2015-10-11 DIAGNOSIS — T148 Other injury of unspecified body region: Secondary | ICD-10-CM

## 2015-10-11 DIAGNOSIS — Z794 Long term (current) use of insulin: Secondary | ICD-10-CM | POA: Diagnosis not present

## 2015-10-11 DIAGNOSIS — Z89422 Acquired absence of other left toe(s): Secondary | ICD-10-CM | POA: Diagnosis not present

## 2015-10-11 DIAGNOSIS — T814XXA Infection following a procedure, initial encounter: Secondary | ICD-10-CM | POA: Diagnosis present

## 2015-10-11 DIAGNOSIS — E11628 Type 2 diabetes mellitus with other skin complications: Secondary | ICD-10-CM | POA: Diagnosis present

## 2015-10-11 DIAGNOSIS — E1140 Type 2 diabetes mellitus with diabetic neuropathy, unspecified: Secondary | ICD-10-CM | POA: Diagnosis present

## 2015-10-11 DIAGNOSIS — E1122 Type 2 diabetes mellitus with diabetic chronic kidney disease: Secondary | ICD-10-CM | POA: Diagnosis present

## 2015-10-11 DIAGNOSIS — L0889 Other specified local infections of the skin and subcutaneous tissue: Secondary | ICD-10-CM | POA: Diagnosis not present

## 2015-10-11 DIAGNOSIS — I129 Hypertensive chronic kidney disease with stage 1 through stage 4 chronic kidney disease, or unspecified chronic kidney disease: Secondary | ICD-10-CM | POA: Diagnosis present

## 2015-10-11 DIAGNOSIS — B9689 Other specified bacterial agents as the cause of diseases classified elsewhere: Secondary | ICD-10-CM | POA: Diagnosis not present

## 2015-10-11 DIAGNOSIS — S91302A Unspecified open wound, left foot, initial encounter: Secondary | ICD-10-CM | POA: Diagnosis present

## 2015-10-11 DIAGNOSIS — I10 Essential (primary) hypertension: Secondary | ICD-10-CM | POA: Diagnosis not present

## 2015-10-11 DIAGNOSIS — L97529 Non-pressure chronic ulcer of other part of left foot with unspecified severity: Secondary | ICD-10-CM | POA: Diagnosis not present

## 2015-10-11 DIAGNOSIS — Z87891 Personal history of nicotine dependence: Secondary | ICD-10-CM | POA: Diagnosis not present

## 2015-10-11 DIAGNOSIS — K219 Gastro-esophageal reflux disease without esophagitis: Secondary | ICD-10-CM | POA: Diagnosis present

## 2015-10-11 DIAGNOSIS — Z6832 Body mass index (BMI) 32.0-32.9, adult: Secondary | ICD-10-CM | POA: Diagnosis not present

## 2015-10-11 DIAGNOSIS — L089 Local infection of the skin and subcutaneous tissue, unspecified: Secondary | ICD-10-CM | POA: Diagnosis not present

## 2015-10-11 DIAGNOSIS — A4901 Methicillin susceptible Staphylococcus aureus infection, unspecified site: Secondary | ICD-10-CM | POA: Diagnosis present

## 2015-10-11 DIAGNOSIS — E1165 Type 2 diabetes mellitus with hyperglycemia: Secondary | ICD-10-CM | POA: Diagnosis present

## 2015-10-11 DIAGNOSIS — E1169 Type 2 diabetes mellitus with other specified complication: Secondary | ICD-10-CM | POA: Diagnosis present

## 2015-10-11 DIAGNOSIS — L03116 Cellulitis of left lower limb: Secondary | ICD-10-CM | POA: Diagnosis present

## 2015-10-11 DIAGNOSIS — M869 Osteomyelitis, unspecified: Secondary | ICD-10-CM | POA: Diagnosis present

## 2015-10-11 DIAGNOSIS — E669 Obesity, unspecified: Secondary | ICD-10-CM | POA: Diagnosis present

## 2015-10-11 DIAGNOSIS — Z833 Family history of diabetes mellitus: Secondary | ICD-10-CM | POA: Diagnosis not present

## 2015-10-11 LAB — BASIC METABOLIC PANEL
Anion gap: 8 (ref 5–15)
BUN: 15 mg/dL (ref 6–20)
CALCIUM: 8.9 mg/dL (ref 8.9–10.3)
CO2: 24 mmol/L (ref 22–32)
CREATININE: 0.91 mg/dL (ref 0.61–1.24)
Chloride: 106 mmol/L (ref 101–111)
GFR calc Af Amer: >60 mL/min (ref >60)
GLUCOSE: 147 mg/dL — AB (ref 65–99)
Potassium: 3.7 mmol/L (ref 3.5–5.1)
SODIUM: 138 mmol/L (ref 135–145)

## 2015-10-11 LAB — GLUCOSE, CAPILLARY
GLUCOSE-CAPILLARY: 126 mg/dL — AB (ref 65–99)
Glucose-Capillary: 133 mg/dL — ABNORMAL HIGH (ref 65–99)
Glucose-Capillary: 156 mg/dL — ABNORMAL HIGH (ref 65–99)
Glucose-Capillary: 147 mg/dL — ABNORMAL HIGH (ref 65–99)
Glucose-Capillary: 151 mg/dL — ABNORMAL HIGH (ref 65–99)

## 2015-10-11 LAB — CBC
HCT: 37.1 % — ABNORMAL LOW (ref 39.0–52.0)
Hemoglobin: 12.2 g/dL — ABNORMAL LOW (ref 13.0–17.0)
MCH: 28.8 pg (ref 26.0–34.0)
MCHC: 32.9 g/dL (ref 30.0–36.0)
MCV: 87.5 fL (ref 78.0–100.0)
PLATELETS: 308 10*3/uL (ref 150–400)
RBC: 4.24 MIL/uL (ref 4.22–5.81)
RDW: 13.5 % (ref 11.5–15.5)
WBC: 7.9 10*3/uL (ref 4.0–10.5)

## 2015-10-11 LAB — SEDIMENTATION RATE: Sed Rate: 6 mm/hr (ref 0–16)

## 2015-10-11 MED ORDER — INSULIN ASPART 100 UNIT/ML ~~LOC~~ SOLN
0.0000 [IU] | Freq: Every day | SUBCUTANEOUS | Status: DC
  Administered 2015-10-12: 2 [IU] via SUBCUTANEOUS

## 2015-10-11 MED ORDER — ACETAMINOPHEN 650 MG RE SUPP: 650.0000 mg | Freq: Four times a day (QID) | RECTAL | Status: DC | PRN

## 2015-10-11 MED ORDER — HYDRALAZINE HCL 20 MG/ML IJ SOLN
5.0000 mg | Freq: Once | INTRAMUSCULAR | Status: AC
  Administered 2015-10-11: 5 mg via INTRAVENOUS
  Filled 2015-10-11: qty 1

## 2015-10-11 MED ORDER — COLLAGENASE 250 UNIT/GM EX OINT: 1.0000 "application " | TOPICAL_OINTMENT | Freq: Every day | CUTANEOUS | 0 refills | Status: DC

## 2015-10-11 MED ORDER — ENOXAPARIN SODIUM 40 MG/0.4ML ~~LOC~~ SOLN
40.0000 mg | SUBCUTANEOUS | Status: DC
  Administered 2015-10-11 – 2015-10-15 (×5): 40 mg via SUBCUTANEOUS
  Filled 2015-10-11 (×5): qty 0.4

## 2015-10-11 MED ORDER — ACETAMINOPHEN 325 MG PO TABS
650.0000 mg | ORAL_TABLET | Freq: Four times a day (QID) | ORAL | Status: DC | PRN
Start: 2015-10-11 — End: 2015-10-15
  Administered 2015-10-11 (×2): 650 mg via ORAL
  Filled 2015-10-11 (×2): qty 2

## 2015-10-11 MED ORDER — INSULIN ASPART 100 UNIT/ML ~~LOC~~ SOLN
0.0000 [IU] | Freq: Three times a day (TID) | SUBCUTANEOUS | Status: DC
  Administered 2015-10-11: 3 [IU] via SUBCUTANEOUS
  Administered 2015-10-11 – 2015-10-12 (×3): 2 [IU] via SUBCUTANEOUS
  Administered 2015-10-12 – 2015-10-13 (×3): 3 [IU] via SUBCUTANEOUS
  Administered 2015-10-13: 5 [IU] via SUBCUTANEOUS
  Administered 2015-10-13 – 2015-10-14 (×2): 3 [IU] via SUBCUTANEOUS
  Administered 2015-10-14: 2 [IU] via SUBCUTANEOUS
  Administered 2015-10-15 (×2): 3 [IU] via SUBCUTANEOUS

## 2015-10-11 MED ORDER — LISINOPRIL 40 MG PO TABS
40.0000 mg | ORAL_TABLET | Freq: Every day | ORAL | Status: DC
  Administered 2015-10-11 – 2015-10-15 (×5): 40 mg via ORAL
  Filled 2015-10-11 (×5): qty 1

## 2015-10-11 MED ORDER — GABAPENTIN 300 MG PO CAPS
300.0000 mg | ORAL_CAPSULE | Freq: Two times a day (BID) | ORAL | Status: DC
  Administered 2015-10-11 – 2015-10-15 (×9): 300 mg via ORAL
  Filled 2015-10-11 (×9): qty 1

## 2015-10-11 MED ORDER — NICOTINE POLACRILEX 2 MG MT GUM
2.0000 mg | CHEWING_GUM | OROMUCOSAL | Status: DC | PRN
  Filled 2015-10-11: qty 1

## 2015-10-11 MED ORDER — INSULIN GLARGINE 100 UNIT/ML ~~LOC~~ SOLN
10.0000 [IU] | Freq: Every day | SUBCUTANEOUS | Status: DC
  Administered 2015-10-11 – 2015-10-12 (×2): 10 [IU] via SUBCUTANEOUS
  Filled 2015-10-11 (×3): qty 0.1

## 2015-10-11 MED ORDER — PIPERACILLIN-TAZOBACTAM 3.375 G IVPB
3.3750 g | Freq: Three times a day (TID) | INTRAVENOUS | Status: DC
  Administered 2015-10-11 – 2015-10-14 (×9): 3.375 g via INTRAVENOUS
  Filled 2015-10-11 (×11): qty 50

## 2015-10-11 MED ORDER — ONDANSETRON HCL 4 MG PO TABS: 4.0000 mg | ORAL_TABLET | Freq: Four times a day (QID) | ORAL | Status: DC | PRN

## 2015-10-11 MED ORDER — ONDANSETRON HCL 4 MG/2ML IJ SOLN
4.0000 mg | Freq: Four times a day (QID) | INTRAMUSCULAR | Status: DC | PRN
Start: 2015-10-11 — End: 2015-10-15

## 2015-10-11 MED ORDER — OXYCODONE HCL 5 MG PO TABS
10.0000 mg | ORAL_TABLET | Freq: Four times a day (QID) | ORAL | Status: DC | PRN
  Administered 2015-10-11 – 2015-10-15 (×9): 10 mg via ORAL
  Filled 2015-10-11 (×10): qty 2

## 2015-10-11 MED ORDER — SODIUM CHLORIDE 0.9 % IV SOLN
INTRAVENOUS | Status: DC | PRN
  Administered 2015-10-11: 1 mL via INTRAVENOUS

## 2015-10-11 MED ORDER — METRONIDAZOLE 500 MG PO TABS: 500.0000 mg | ORAL_TABLET | Freq: Three times a day (TID) | ORAL | Status: DC

## 2015-10-11 MED ORDER — COLLAGENASE 250 UNIT/GM EX OINT
TOPICAL_OINTMENT | Freq: Every day | CUTANEOUS | Status: DC
  Administered 2015-10-12 – 2015-10-15 (×4): via TOPICAL
  Filled 2015-10-11 (×2): qty 30

## 2015-10-11 MED ORDER — VANCOMYCIN HCL 10 G IV SOLR
1250.0000 mg | Freq: Three times a day (TID) | INTRAVENOUS | Status: DC
  Administered 2015-10-11 – 2015-10-12 (×4): 1250 mg via INTRAVENOUS
  Filled 2015-10-11 (×5): qty 1250

## 2015-10-11 MED ORDER — DEXTROSE 5 % IV SOLN
2.0000 g | INTRAVENOUS | Status: DC
  Filled 2015-10-11: qty 2

## 2015-10-11 MED ORDER — GADOBENATE DIMEGLUMINE 529 MG/ML IV SOLN
20.0000 mL | Freq: Once | INTRAVENOUS | Status: AC | PRN
  Administered 2015-10-11: 20 mL via INTRAVENOUS

## 2015-10-11 MED ORDER — SENNOSIDES-DOCUSATE SODIUM 8.6-50 MG PO TABS: 1.0000 | ORAL_TABLET | Freq: Every evening | ORAL | Status: DC | PRN

## 2015-10-11 NOTE — Consult Note (Signed)
WOC consult requested for foot wound.  MRI results indicate: Large ulceration/wound extends all the way down to the dorsal articulation between the bases of the fourth and fifth metatarsals. There is abnormal edema and enhancement in the remaining base of the fifth metatarsal characteristic of osteomyelitis. This complex medical condition is beyond the scope of practice for [facility] nursing.  Please consult ortho team for further plan of care. Discussed with primary team via phone. Please re-consult if further assistance is needed.  Thank-you,  Julien Girt MSN, [place], [place], [place], [place]

## 2015-10-11 NOTE — H&P (Signed)
History and Physical  Patient Name: Danny Fuller     YHC:[id number]    DOB: 04/10/1975    DOA: 10/10/2015 PCP: [name], MD   Patient coming from: Home  Chief Complaint: Foot drainage and pain and odor  HPI: Danny Fuller is a 40 y.o. 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  The patient initially presented in early June with infection spreading from her calloused left fifth digit on his neuropathic feet, which had osteomyelitis by MRI with elevated CRP, normal ABIs, seen by infectious disease and orthopedics, underwent uneventful amputation of the left fifth ray.  Since then he has been doing well. The drainage stopped after surgery, he was dressing the wound at home with his wife, had no significant pain, and hadn't even started putting some weight on the foot occasionally. However over the last several weeks, he has noticed increased pain with weightbearing, increased swelling of his foot and lower leg, increased drainage again, and a foul-smelling odor. He saw his orthopedist for this recently and BNP started him on doxycycline which she has been taking for the last 7 days. However in that time, the pain, foul-smelling discharge, and swelling seemed to have gotten gradually worse and so he came to the ER today.  ED course: -Afebrile, heart rate 100, respirations and blood pressure normal, saturating well on room air -Na 139, K 4.0, Cr 0.8 (baseline), WBC 8.3 K, Hgb 13, CRP normal -Radiograph of the left foot showed only soft tissue swelling -He was given vancomycin and Zosyn and an MR of the left foot was ordered, and then TRH were asked to evaluate for admission     ROS: Review of Systems  Constitutional: Negative for chills, fever and malaise/fatigue.  All other systems reviewed and  are negative.       Past Medical History:  Diagnosis Date  . Asthma   . Diabetes mellitus   . GERD (gastroesophageal reflux disease)   . Headache(784.0)   . Hypertension     Past Surgical History:  Procedure Laterality Date  . AMPUTATION Left 07/26/2015   Procedure: AMPUTATION LEFT FIFTH RAY;  Surgeon: [name], MD;  Location: [facility];  Service: Orthopedics;  Laterality: Left;  . bilateral hip pins placed      Social History: Patient lives with his wife.  The patient walked unassisted before surgery, has been putting weight on leg as tolerated since.  Former smoker, quit at time of amputation, none since.  No alcohol.  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    No Known Allergies  Family history: family history includes Diabetes in his brother, mother, and sister.  Prior to Admission medications   Medication Sig Start Date End Date Taking? Authorizing Provider  blood glucose meter kit and supplies KIT Dispense based on patient and insurance preference. Use up to four times daily as directed. (FOR ICD-9 250.00, 250.01). 07/29/15   [name], MD  gabapentin (NEURONTIN) 300 MG capsule take 1 capsule by mouth at bedtime for 1 week then 1 capsule twic...  (REFER TO PRESCRIPTION NOTES). 09/07/15   Historical Provider, MD  glipiZIDE (GLUCOTROL) 10 MG tablet take 1 tablet by mouth twice a day BEFORE A MEAL 07/29/15   Historical Provider, MD  glucose blood (ONETOUCH VERIO)  test strip Use to check blood sugar 1 time per day. 09/16/15   [name], MD  insulin aspart (NOVOLOG FLEXPEN) 100 UNIT/ML FlexPen Before each meal 3 times a day, 140-199 - 2 units, 200-250 - 4 units, 251-299 - 6 units,  300-349 - 8 units,  350 or above 10 units. Insulin PEN if approved, provide syringes and needles if needed. Patient not taking: Reported on 09/16/2015 07/29/15   [name], MD  Insulin Glargine (LANTUS) 100 UNIT/ML Solostar Pen Inject 20 Units into the skin daily at  10 pm. 07/29/15   [name], MD  Insulin Pen Needle (PEN NEEDLES) 31G X 6 MM MISC For a month supply 07/29/15   [name], MD  lactobacilus acidophilus & bulgar [facility]) TABS chewable tablet Take 1 tablet by mouth 3 (three) times daily with meals. Patient not taking: Reported on 08/16/2015 07/29/15   [name], MD  lisinopril (PRINIVIL,ZESTRIL) 20 MG tablet Take 1 tablet (20 mg total) by mouth daily. 07/29/15   [name], MD  metFORMIN (GLUCOPHAGE) 1000 MG tablet Take 0.5 tablets (500 mg total) by mouth 2 (two) times daily with a meal. Patient not taking: Reported on 09/16/2015 08/16/15   [name], MD  oxyCODONE-acetaminophen (PERCOCET) 10-325 MG tablet Take 1 tablet by mouth every 6 (six) hours as needed for pain. 08/16/15   [name], MD       Physical Exam: BP (!) 158/103   Pulse 83   Temp 97.7 F (36.5 C) (Oral)   Resp 18   Ht 5' 9"  (1.753 m)   Wt 100.4 kg (221 lb 5 oz)   SpO2 100%   BMI 32.68 kg/m  General appearance: Well-developed, obese adult male, alert and in no acute distress.   Eyes: Anicteric, conjunctiva pink, lids and lashes normal.     ENT: No nasal deformity, discharge, or epistaxis.  OP moist without lesions.   Lymph: No cervical or supraclavicular lymphadenopathy. Skin: Warm and dry.  No jaundice.  Foul odor of left foot.  Granulation tissue on base.  Pain to palpation over foot and shin.  No obvious redness.  Mild edema of right lower leg and foot:   Cardiac: RRR, nl S1-S2, no murmurs appreciated.  Capillary refill is brisk.  JVP normal.  Radial pulses 2+ and symmetric. Respiratory: Normal respiratory rate and rhythm.  CTAB without rales or wheezes. GI: Abdomen soft without rigidity.  No TTP. No ascites, distension, hepatosplenomegaly.   MSK: Right 5th toe amputation.  No deformities or effusions.  No clubbing/cyanosis. Neuro: Sensorium intact and responding to questions, attention normal.  Speech is fluent.  Moves all extremities equally and with normal coordination.      Psych: Affect normal.  Judgment and insight appear normal.       Labs on Admission:  I have personally reviewed following labs and imaging studies: CBC:  Recent Labs Lab 10/10/15 2030  WBC 8.3  NEUTROABS 3.6  HGB 13.9  HCT 40.8  MCV 86.6  PLT 423   Basic Metabolic Panel:  Recent Labs Lab 10/10/15 2030  NA 139  K 4.0  CL 107  CO2 23  GLUCOSE 136*  BUN 10  CREATININE 0.82  CALCIUM 9.1   GFR: Estimated Creatinine Clearance: 142.7 mL/min (by C-G formula based on SCr of 0.82 mg/dL).  Liver Function Tests:  Recent Labs Lab 10/10/15 2030  AST 16  ALT 16*  ALKPHOS 83  BILITOT 1.2  PROT 7.8  ALBUMIN 4.2  Radiological Exams on Admission: Personally reviewed: Dg Foot Complete Left  Result Date: 10/10/2015 CLINICAL DATA:  Pain around the amputated region of left fifth metatarsal for 4 days. Bad smell and pus. EXAM: LEFT FOOT - COMPLETE 3+ VIEW COMPARISON:  MRI left foot 07/24/2015.  Left foot 07/23/2015 FINDINGS: Since the previous study, there has been interval amputation of the left fifth ray at the level of the proximal metatarsal. Margin of resection appears intact without evidence of significant erosion or cortical loss. Soft tissue swelling around the left forefoot at the metatarsal phalangeal region. No radiopaque soft tissue foreign bodies or gas collections. No acute fractures identified. Vascular calcifications. IMPRESSION: Soft tissue swelling. Interval amputation of the left fifth toe at the level of the proximal metatarsal. No acute bony abnormalities. Electronically Signed   By: Lucienne Capers M.D.   On: 10/10/2015 20:52       Assessment/Plan 1. Wound infection:  New pain, swelling, foul-smelling drainage from surgical wound.    Culture of expressed pus during last hospitalization grew GBS and coag negative staph only.  MRSA screen negative and no personal history of resistant infections.  Microbiologically would assume this to be similar to  a diabetic foot infection.  ABIs WNL during last hospitalization.  Blood sugars well controlled since last hospitalization.  Has stopped smoking.  If MRI negative for osteomyelitis, will transition to oral antibiotics when HR improves and discharge to home.  If positive, will discuss with Orthopedics.  Unfortunately, he has been on doxycycline for a week and IV antibiotics were given already in the ER, so if MRI is positive for osteo (although this seems unlikely given the history), targeting antibiotics may be difficult -Follow up MRI -Consult wound -Follow blood cultures -Ceftriaxone and metronidazole will be continued if MRI negative    2. IDDM:  -Continue glargine 10 units nightly (decreased by endocrine recently) -SSI as needed -Hold metformin and glipizide in hospital  3. HTN:  -Continue lisinopril 40 mg daily (changed by endocrine recently)  4. Former smoking: -Nicotine gum, PRN   DVT prophylaxis: Lovenox  Code Status: FULL  Family Communication: Wife by phone  Disposition Plan: Anticipate IV antibiotics and follow up MRI.  If MRI negative, likely home when stable and able to take PO.  If MRI shows osteomyelitis, will consult ID and Orthopedics. Consults called: None overnight Admission status: OBS, med surg At the point of initial evaluation, it is my clinical opinion that admission for OBSERVATION is reasonable and necessary because the patient's presenting complaints in the context of their chronic conditions represent sufficient risk of deterioration or significant morbidity to constitute reasonable grounds for close observation in the hospital setting, but that the patient may be medically stable for discharge from the hospital within 24 to 48 hours.    Medical decision making: Patient seen at 12:40 AM on 10/11/2015.  The patient was discussed with Will Dansie, PA-C. What exists of the patient's chart was reviewed in depth.  Clinical condition: stable.         Edwin Dada [facility] Pager [phone]

## 2015-10-11 NOTE — Consult Note (Signed)
ORTHOPAEDIC CONSULTATION  REQUESTING PHYSICIAN: [name], MD  Chief Complaint: Increasing pain left foot status post fifth ray amputation.  HPI: Danny Fuller is a 40 y.o. male who presents with eschar dorsal lateral aspect the left foot status post fifth ray amputation. Patient complained of increased pain was brought to the emergency room and is now on IV antibiotics for treatment of the left foot infection.  Past Medical History:  Diagnosis Date  . Asthma   . Diabetes mellitus   . GERD (gastroesophageal reflux disease)   . Headache(784.0)   . Hypertension    Past Surgical History:  Procedure Laterality Date  . AMPUTATION Left 07/26/2015   Procedure: AMPUTATION LEFT FIFTH RAY;  Surgeon: [name], MD;  Location: [facility];  Service: Orthopedics;  Laterality: Left;  . bilateral hip pins placed     Social History   Social History  . Marital status: Married    Spouse name: N/A  . Number of children: N/A  . Years of education: N/A   Social History Main Topics  . Smoking status: Former Smoker    Types: Cigarettes  . Smokeless tobacco: Never Used     Comment: quit 5 days ago  . Alcohol use No  . Drug use: No  . Sexual activity: Not Asked   Other Topics Concern  . None   Social History Narrative  . None   Family History  Problem Relation Age of Onset  . Arthritis    . Diabetes    . Hypertension    . Hyperlipidemia    . Stroke    . Sudden death    . Diabetes Mother   . Diabetes Sister   . Diabetes Brother    - negative except otherwise stated in the family history section No Known Allergies Prior to Admission medications   Medication Sig Start Date End Date Taking? Authorizing Provider  gabapentin (NEURONTIN) 300 MG capsule Take 300 mg by mouth 2 (two) times daily.   Yes Historical Provider, MD  insulin aspart (NOVOLOG FLEXPEN) 100 UNIT/ML FlexPen Before each meal 3 times a day, 140-199 - 2 units, 200-250 - 4 units, 251-299 - 6 units,  300-349 - 8  units,  350 or above 10 units. Insulin PEN if approved, provide syringes and needles if needed. 07/29/15  Yes [name], MD  Insulin Glargine (LANTUS) 100 UNIT/ML Solostar Pen Inject 20 Units into the skin daily at 10 pm. 07/29/15  Yes [name], MD  lisinopril (PRINIVIL,ZESTRIL) 20 MG tablet Take 1 tablet (20 mg total) by mouth daily. 07/29/15  Yes [name], MD  metFORMIN (GLUCOPHAGE) 1000 MG tablet Take 0.5 tablets (500 mg total) by mouth 2 (two) times daily with a meal. 08/16/15  Yes [name], MD  blood glucose meter kit and supplies KIT Dispense based on patient and insurance preference. Use up to four times daily as directed. (FOR ICD-9 250.00, 250.01). 07/29/15   [name], MD  glucose blood (ONETOUCH VERIO) test strip Use to check blood sugar 1 time per day. 09/16/15   [name], MD  Insulin Pen Needle (PEN NEEDLES) 31G X 6 MM MISC For a month supply 07/29/15   [name], MD  lactobacilus acidophilus & bulgar [facility]) TABS chewable tablet Take 1 tablet by mouth 3 (three) times daily with meals. Patient not taking: Reported on 08/16/2015 07/29/15   [name], MD  oxyCODONE-acetaminophen (PERCOCET) 10-325 MG tablet Take 1 tablet by mouth every 6 (six) hours  as needed for pain. Patient not taking: Reported on 10/11/2015 08/16/15   [name], MD   Mr Foot Left W Wo Contrast  Result Date: 10/11/2015 CLINICAL DATA:  Poorly controlled diabetes. Hypertension. 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 Prior osteomyelitis. Worsening drainage, pain, and swelling along the wound. EXAM: MRI OF THE LEFT FOREFOOT WITHOUT AND WITH CONTRAST TECHNIQUE: Multiplanar, multisequence MR imaging was performed both before and after administration of intravenous contrast. CONTRAST:  65m MULTIHANCE GADOBENATE DIMEGLUMINE 529 MG/ML IV SOLN COMPARISON:  10/10/2015 radiographs and MRI from 07/24/2015 FINDINGS: Large dorsal ulceration of the forefoot extends down to the remaining base of the fifth metatarsal and its articulation with the  fourth metatarsal base as shown on images 6 through 8 of series 9. This ulceration has enhancing margins favoring granulation tissue but hypoenhancement centrally indicating necrotic tissue. There is abnormal edema and enhancement in the remaining fifth metatarsal base favoring osteomyelitis. There is also abnormal edema within the bony structures along the Lisfranc joint, with associated low-grade enhancement notable in the bases of the second, third, and fourth metatarsals ; in the middle cuneiform ; and to a lesser extent in the lateral cuneiform and cuboid. Whereas there was some low-grade edema in the bases of the second and third metatarsals and middle cuneiform proximally, the amount of edema and enhancement appears increased today, especially along the cuboid and base of the fourth metatarsal. The Lisfranc ligament appears intact. There is mild periostitis proximally in the fourth metatarsal. There is dorsal subcutaneous edema and low-level edema and enhancement tracks along the plantar musculature of the foot. No significant phalangeal edema identified. Small degenerative lesion in the head of the first metatarsal at its articulation with the medial sesamoid, image 22/9. Small effusion of the first MTP joint The subcutaneous edema and enhancement extends into the toes. IMPRESSION: 1. Large ulceration/wound extends all the way down to the dorsal articulation between the bases of the fourth and fifth metatarsals. There is abnormal edema and enhancement in the remaining base of the fifth metatarsal characteristic of osteomyelitis. 2. Worsening edema and enhancement along much of the rest of the Lisfranc joint, especially the base of the fourth metatarsal and proximal shaft, as well as the bases of the second and third metatarsals and the middle and lateral cuneiform second anterior cuboid. While some of this may be due to arthropathy, the increase particularly in the base of the fourth metatarsal is concerning  for osteomyelitis, and early osteomyelitis at the second and third tarsometatarsal articulation is likewise difficult to exclude. 3. Subcutaneous edema and enhancement compatible with cellulitis. Potential mild myositis along the distal plantar foot musculature. Electronically Signed   By: WVan ClinesM.D.   On: 10/11/2015 08:18   Dg Foot Complete Left  Result Date: 10/10/2015 CLINICAL DATA:  Pain around the amputated region of left fifth metatarsal for 4 days. Bad smell and pus. EXAM: LEFT FOOT - COMPLETE 3+ VIEW COMPARISON:  MRI left foot 07/24/2015.  Left foot 07/23/2015 FINDINGS: Since the previous study, there has been interval amputation of the left fifth ray at the level of the proximal metatarsal. Margin of resection appears intact without evidence of significant erosion or cortical loss. Soft tissue swelling around the left forefoot at the metatarsal phalangeal region. No radiopaque soft tissue foreign bodies or gas collections. No acute fractures identified. Vascular calcifications. IMPRESSION: Soft tissue swelling. Interval amputation of the left fifth toe at the level of the proximal metatarsal. No acute bony abnormalities. Electronically Signed  By: Lucienne Capers M.D.   On: 10/10/2015 20:52   - pertinent xrays, CT, MRI studies were reviewed and independently interpreted  Positive ROS: All other systems have been reviewed and were otherwise negative with the exception of those mentioned in the HPI and as above.  Physical Exam: General: Alert, no acute distress Psychiatric: Patient is competent for consent with normal mood and affect Lymphatic: No axillary or cervical lymphadenopathy Cardiovascular: No pedal edema Respiratory: No cyanosis, no use of accessory musculature GI: No organomegaly, abdomen is soft and non-tender  Skin: Patient has a soft black eschar over the dorsal lateral aspect of his foot the wounds approximate 5 x 6 cm. There is good granulation tissue around  the wound edges there is no purulence no drainage no cellulitis there is very minimal swelling he does have some venous changes to the skin with dark coloration.   Neurologic: Patient does not have protective sensation bilateral lower extremities.   MUSCULOSKELETAL:  On examination patient has a strong dorsalis pedis pulse there is no ascending cellulitis no purulent drainage the wound is superficial with good granulation tissue around the wound edges. There is no deep wound nothing probes to bone. The area of potential osteomyelitis from the MRI scan is not an area that is tender or has a ulcer which goes down to bone. Review of the MRI scan and radiographs shows some bony edema in the distal aspect of the fifth metatarsal however patient is essentially asymptomatic over this location. The transverse base of the fifth metatarsal Lisfranc joint complex does have some arthritic changes but clinically no signs of infection.  Assessment: Assessment: Cellulitis left foot status post fifth ray amputation with improved wound with soft eschar and no clinical signs of osteomyelitis.  Plan: Plan: Would continue IV antibiotics and then discharge on oral antibiotics oh follow-up in the office next week. We will start Santyl dressing changes today. Patient states that he is not ready to consider a transtibial amputation will continue with limb salvage with wound care and antibiotics.  Thank you for the consult and the opportunity to see Mr. Lina Sayre, MD [place] [phone] 6:00 PM

## 2015-10-11 NOTE — Progress Notes (Signed)
MD notified of BP 170/94

## 2015-10-11 NOTE — Progress Notes (Signed)
ANTIBIOTIC CONSULT NOTE - Initial Consult  Pharmacy Consult:  Vancomycin / Zosyn Indication:  Osteomyelitis  No Known Allergies  Patient Measurements: Height: 5\' 9"  (175.3 cm) Weight: 221 lb 3.2 oz (100.3 kg) IBW/kg (Calculated) : 70.7  Vital Signs: Temp: 98.5 F (36.9 C) (08/28 0640) Temp Source: Oral (08/28 0240) BP: 170/97 (08/28 0640) Pulse Rate: 84 (08/28 0640)  Labs:  Recent Labs  10/10/15 2030 10/11/15 0324  HGB 13.9 12.2*  HCT 40.8 37.1*  PLT 340 308  CREATININE 0.82 0.91    Estimated Creatinine Clearance: 128.4 mL/min (by C-G formula based on SCr of 0.91 mg/dL).   Medical History: Past Medical History:  Diagnosis Date  . Asthma   . Diabetes mellitus   . GERD (gastroesophageal reflux disease)   . Headache(784.0)   . Hypertension       Assessment: 66 YOM with history of recent left 5th digit amputation presented on 10/11/15 with complaint of foot pain with odor and drainage.  Pharmacy consulted to continue vancomycin and Zosyn for osteomyelitis.  Noted patient received vancomycin 2gm IV x 1 and Zosyn 3.375gm IV x 1 in the ED on 10/10/15.  Reviewed dosing from previous admission and patient was on vancomycin 1500mg  IV Q8H with trough of 14 mcg/mL.  Patient's current creatinine is higher compared to previous labs.  Vanc 8/28 >> Zosyn 8/28 >>  6/13 VT = 14 mcg/mL on 1500mg  q8 (SCr 0.61)   Goal of Therapy:  Vanc trough:  15-20 mcg/mL    Plan:  - Vanc 1250mg  IV Q8H - Zosyn 3.375gm IV Q8H, 4 hr infusion - Monitor renal fxn, clinical progress, vanc trough as indicated   Rondia Higginbotham D. Mina Marble, PharmD, BCPS Pager:  [phone] 10/11/2015, 11:45 AM

## 2015-10-11 NOTE — Progress Notes (Signed)
Patient seen and examined  40 y.o. 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 Wound infection:  New pain, swelling, foul-smelling drainage from surgical wound.   Previous wound culture STREPTOCOCCUS AGALACTIAE   Organism ID, Bacteria STAPHYLOCOCCUS SPECIES (COAGULASE NEGATIVE      MRSA screen negative and no personal history of resistant infections.   Marland Kitchen  ABIs WNL during last hospitalization.  Blood sugars well controlled since last hospitalization.  Has stopped smoking.   -Follow up MRI -Confirms osteomyelitis/myositis/8 for abscess, Dr. Sharol Given consulted Beyond the scope of wound care Started vancomycin and Zosyn -Follow blood cultures Nothing by mouth pending further recommendations from Dr. Sharol Given  2. IDDM:  -Continue glargine 10 units nightly (decreased by endocrine recently) -SSI as needed -Hold metformin and glipizide in hospital  3. HTN:  -Continue lisinopril 40 mg daily (changed by endocrine recently)  4. Former smoking: -Nicotine gum, PRN

## 2015-10-12 ENCOUNTER — Other Ambulatory Visit: Payer: BC Managed Care – PPO

## 2015-10-12 DIAGNOSIS — I10 Essential (primary) hypertension: Secondary | ICD-10-CM

## 2015-10-12 DIAGNOSIS — L089 Local infection of the skin and subcutaneous tissue, unspecified: Secondary | ICD-10-CM

## 2015-10-12 DIAGNOSIS — E1169 Type 2 diabetes mellitus with other specified complication: Secondary | ICD-10-CM

## 2015-10-12 DIAGNOSIS — N179 Acute kidney failure, unspecified: Secondary | ICD-10-CM

## 2015-10-12 DIAGNOSIS — N189 Chronic kidney disease, unspecified: Secondary | ICD-10-CM

## 2015-10-12 LAB — GLUCOSE, CAPILLARY
GLUCOSE-CAPILLARY: 145 mg/dL — AB (ref 65–99)
GLUCOSE-CAPILLARY: 184 mg/dL — AB (ref 65–99)
Glucose-Capillary: 153 mg/dL — ABNORMAL HIGH (ref 65–99)
Glucose-Capillary: 202 mg/dL — ABNORMAL HIGH (ref 65–99)

## 2015-10-12 LAB — COMPREHENSIVE METABOLIC PANEL
ALBUMIN: 3.2 g/dL — AB (ref 3.5–5.0)
ALK PHOS: 64 U/L (ref 38–126)
ALT: 13 U/L — AB (ref 17–63)
AST: 11 U/L — ABNORMAL LOW (ref 15–41)
Anion gap: 5 (ref 5–15)
BILIRUBIN TOTAL: 0.7 mg/dL (ref 0.3–1.2)
BUN: 11 mg/dL (ref 6–20)
CALCIUM: 9 mg/dL (ref 8.9–10.3)
CHLORIDE: 108 mmol/L (ref 101–111)
CO2: 24 mmol/L (ref 22–32)
CREATININE: 0.68 mg/dL (ref 0.61–1.24)
GFR calc non Af Amer: >60 mL/min (ref >60)
Glucose, Bld: 146 mg/dL — ABNORMAL HIGH (ref 65–99)
Potassium: 3.7 mmol/L (ref 3.5–5.1)
SODIUM: 137 mmol/L (ref 135–145)
Total Protein: 6.4 g/dL — ABNORMAL LOW (ref 6.5–8.1)

## 2015-10-12 LAB — CBC
HEMATOCRIT: 38.8 % — AB (ref 39.0–52.0)
HEMOGLOBIN: 13.2 g/dL (ref 13.0–17.0)
MCH: 29.3 pg (ref 26.0–34.0)
MCHC: 34 g/dL (ref 30.0–36.0)
MCV: 86 fL (ref 78.0–100.0)
Platelets: 302 10*3/uL (ref 150–400)
RBC: 4.51 MIL/uL (ref 4.22–5.81)
RDW: 13.3 % (ref 11.5–15.5)
WBC: 8 10*3/uL (ref 4.0–10.5)

## 2015-10-12 MED ORDER — VANCOMYCIN HCL 10 G IV SOLR
1500.0000 mg | Freq: Three times a day (TID) | INTRAVENOUS | Status: DC
  Administered 2015-10-12 – 2015-10-15 (×9): 1500 mg via INTRAVENOUS
  Filled 2015-10-12 (×11): qty 1500

## 2015-10-12 NOTE — Progress Notes (Signed)
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 Wound infection: New pain, swelling, foul-smelling drainage from surgical wound. Patient seen by Dr. Sharol Given, patient also will be evaluated by infectious disease, Dr. Megan Salon Previous wound culture showed STREPTOCOCCUS AGALACTIAE  and staph coag negative   MRSA screen negative and no personal history of resistant infections.  Marland Kitchen ABIs WNL during last hospitalization. Blood sugars well controlled since last hospitalization. Has stopped smoking.  -Follow up MRI -Confirms osteomyelitis/myositis/8 for abscess, Dr. Sharol Given recommended antibiotics, Beyond the scope of wound care Continue vancomycin and Zosyn, infectious disease may recommend surgical debridement -Follow blood cultures    2. IDDM:Accu-Chek stable -Continue glargine 10 units nightly (decreased by endocrine recently) -SSI as needed -Hold metformin and glipizide in hospital  3. HTN: -Continue lisinopril 40 mg daily (changed by endocrine recently)  4. Former smoking: -Nicotine gum, PRN     DVT prophylaxsis Lovenox  Code Status:  Full code    Family Communication: Discussed in detail with the patient, all imaging results, lab results explained to the patient   Disposition Plan:  As per orthopedics and infectious disease      Consultants:  Infectious disease  Orthopedics  Procedures:  None  Antibiotics: Anti-infectives    Start     Dose/Rate Route Frequency Ordered Stop   10/11/15 1300  vancomycin (VANCOCIN) 1,250 mg in sodium chloride 0.9 % 250 mL IVPB     1,250 mg 166.7 mL/hr over 90 Minutes Intravenous Every 8 hours 10/11/15 1147     10/11/15 1300  piperacillin-tazobactam (ZOSYN) IVPB 3.375 g     3.375 g 12.5 mL/hr over 240 Minutes Intravenous Every 8 hours 10/11/15 1147        HPI/Subjective: Patient concerned about losing his left foot  Objective: Vitals:   10/11/15 0640 10/11/15 1337 10/11/15 2047 10/12/15 0532  BP: (!) 170/97 (!) 148/84 (!) 164/80 (!) 148/88  Pulse: 84 80 83 78  Resp: 18 18 19 19   Temp: 98.5 F (36.9 C) 97.6 F (36.4 C) 98.1 F (36.7 C) 98.3 F (36.8 C)  TempSrc:  Oral Oral Oral  SpO2: 100% 100% 100% 100%  Weight: 100.3 kg (221 lb 3.2 oz)     Height: 5\' 9"  (1.753 m)       Intake/Output Summary (Last 24 hours) at 10/12/15 1049 Last data filed at 10/12/15 0735  Gross per 24 hour  Intake          1504.34 ml  Output             1400 ml  Net           104.34 ml    Exam:  Examination:  General exam: Appears calm and comfortable  Respiratory system: Clear to auscultation. Respiratory effort normal. Cardiovascular system: S1 & S2 heard, RRR. No JVD, murmurs, rubs, gallops or clicks. No pedal edema.  Gastrointestinal system: Abdomen is nondistended, soft and nontender. No organomegaly or masses felt. Normal bowel sounds heard. Central nervous system: Alert and oriented. No focal neurological deficits. Extremities: Symmetric 5 x 5 power. Skin: No rashes, lesions or ulcers Psychiatry: Judgement and insight appear normal. Mood & affect appropriate.     Data Reviewed: I have personally reviewed following labs and imaging studies  Micro Results Recent Results (from the past 240 hour(s))  Wound or Superficial Culture     Status: None (Preliminary result)    Collection Time: 10/10/15 10:50 PM  Result Value Ref Range Status   Specimen Description WOUND LEFT FOOT  Final   Special Requests PINKY TOE AREA  Final   Gram Stain   Final    FEW WBC PRESENT, PREDOMINANTLY PMN FEW GRAM POSITIVE COCCI IN PAIRS RARE GRAM NEGATIVE RODS    Culture PENDING  Incomplete   Report Status PENDING  Incomplete  Culture, blood (routine x 2)     Status: None (Preliminary result)   Collection Time: 10/10/15 11:11 PM  Result Value Ref Range Status   Specimen Description BLOOD LEFT ANTECUBITAL  Final   Special Requests BOTTLES DRAWN AEROBIC AND ANAEROBIC 5CC EA  Final   Culture NO GROWTH 1 DAY  Final   Report Status PENDING  Incomplete  Culture, blood (routine x 2)     Status: None (Preliminary result)   Collection Time: 10/10/15 11:39 PM  Result Value Ref Range Status   Specimen Description BLOOD RIGHT WRIST  Final   Special Requests BOTTLES DRAWN AEROBIC AND ANAEROBIC 10CC EA  Final   Culture NO GROWTH 1 DAY  Final   Report Status PENDING  Incomplete    Radiology Reports Mr Foot Left W Wo Contrast  Result Date: 10/11/2015 CLINICAL DATA:  Poorly controlled diabetes. Hypertension. 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 Prior osteomyelitis. Worsening drainage, pain, and swelling along the wound. EXAM: MRI OF THE LEFT FOREFOOT WITHOUT AND WITH CONTRAST TECHNIQUE: Multiplanar, multisequence MR imaging was performed both before and after administration of intravenous contrast. CONTRAST:  47mL MULTIHANCE GADOBENATE DIMEGLUMINE 529 MG/ML IV SOLN COMPARISON:  10/10/2015 radiographs and MRI from 07/24/2015 FINDINGS: Large dorsal ulceration of the forefoot extends down to the remaining base of the fifth metatarsal and its articulation with the fourth metatarsal base as shown on images 6 through 8 of series 9. This ulceration has enhancing margins favoring granulation tissue but hypoenhancement centrally indicating necrotic tissue. There is abnormal edema and enhancement in the  remaining fifth metatarsal base favoring osteomyelitis. There is also abnormal edema within the bony structures along the Lisfranc joint, with associated low-grade enhancement notable in the bases of the second, third, and fourth metatarsals ; in the middle cuneiform ; and to a lesser extent in the lateral cuneiform and cuboid. Whereas there was some low-grade edema in the bases of the second and third metatarsals and middle cuneiform proximally, the amount of edema and enhancement appears increased today, especially along the cuboid and base of the fourth metatarsal. The Lisfranc ligament appears intact. There is mild periostitis proximally in the fourth metatarsal. There is dorsal subcutaneous edema and low-level edema and enhancement tracks along the plantar musculature of the foot. No significant phalangeal edema identified. Small degenerative lesion in the head of the first metatarsal at its articulation with the medial sesamoid, image 22/9. Small effusion of the first MTP joint The subcutaneous edema and enhancement extends into the toes. IMPRESSION: 1. Large ulceration/wound extends all the way down to the dorsal articulation  between the bases of the fourth and fifth metatarsals. There is abnormal edema and enhancement in the remaining base of the fifth metatarsal characteristic of osteomyelitis. 2. Worsening edema and enhancement along much of the rest of the Lisfranc joint, especially the base of the fourth metatarsal and proximal shaft, as well as the bases of the second and third metatarsals and the middle and lateral cuneiform second anterior cuboid. While some of this may be due to arthropathy, the increase particularly in the base of the fourth metatarsal is concerning for osteomyelitis, and early osteomyelitis at the second and third tarsometatarsal articulation is likewise difficult to exclude. 3. Subcutaneous edema and enhancement compatible with cellulitis. Potential mild myositis along the distal  plantar foot musculature. Electronically Signed   By: Van Clines M.D.   On: 10/11/2015 08:18   Dg Foot Complete Left  Result Date: 10/10/2015 CLINICAL DATA:  Pain around the amputated region of left fifth metatarsal for 4 days. Bad smell and pus. EXAM: LEFT FOOT - COMPLETE 3+ VIEW COMPARISON:  MRI left foot 07/24/2015.  Left foot 07/23/2015 FINDINGS: Since the previous study, there has been interval amputation of the left fifth ray at the level of the proximal metatarsal. Margin of resection appears intact without evidence of significant erosion or cortical loss. Soft tissue swelling around the left forefoot at the metatarsal phalangeal region. No radiopaque soft tissue foreign bodies or gas collections. No acute fractures identified. Vascular calcifications. IMPRESSION: Soft tissue swelling. Interval amputation of the left fifth toe at the level of the proximal metatarsal. No acute bony abnormalities. Electronically Signed   By: Lucienne Capers M.D.   On: 10/10/2015 20:52     CBC  Recent Labs Lab 10/10/15 2030 10/11/15 0324 10/12/15 0655  WBC 8.3 7.9 8.0  HGB 13.9 12.2* 13.2  HCT 40.8 37.1* 38.8*  PLT 340 308 302  MCV 86.6 87.5 86.0  MCH 29.5 28.8 29.3  MCHC 34.1 32.9 34.0  RDW 13.4 13.5 13.3  LYMPHSABS 4.0  --   --   MONOABS 0.6  --   --   EOSABS 0.1  --   --   BASOSABS 0.1  --   --     Chemistries   Recent Labs Lab 10/10/15 2030 10/11/15 0324 10/12/15 0655  NA 139 138 137  K 4.0 3.7 3.7  CL 107 106 108  CO2 23 24 24   GLUCOSE 136* 147* 146*  BUN 10 15 11   CREATININE 0.82 0.91 0.68  CALCIUM 9.1 8.9 9.0  AST 16  --  11*  ALT 16*  --  13*  ALKPHOS 83  --  64  BILITOT 1.2  --  0.7   ------------------------------------------------------------------------------------------------------------------ estimated creatinine clearance is 146.1 mL/min (by C-G formula based on SCr of 0.8  mg/dL). ------------------------------------------------------------------------------------------------------------------ No results for input(s): HGBA1C in the last 72 hours. ------------------------------------------------------------------------------------------------------------------ No results for input(s): CHOL, HDL, LDLCALC, TRIG, CHOLHDL, LDLDIRECT in the last 72 hours. ------------------------------------------------------------------------------------------------------------------ No results for input(s): TSH, T4TOTAL, T3FREE, THYROIDAB in the last 72 hours.  Invalid input(s): FREET3 ------------------------------------------------------------------------------------------------------------------ No results for input(s): VITAMINB12, FOLATE, FERRITIN, TIBC, IRON, RETICCTPCT in the last 72 hours.  Coagulation profile No results for input(s): INR, PROTIME in the last 168 hours.  No results for input(s): DDIMER in the last 72 hours.  Cardiac Enzymes No results for input(s): CKMB, TROPONINI, MYOGLOBIN in the last 168 hours.  Invalid input(s): CK ------------------------------------------------------------------------------------------------------------------ Invalid input(s): POCBNP   CBG:  Recent Labs Lab 10/11/15 0718 10/11/15 1155 10/11/15 1635 10/11/15  2134 10/12/15 0747  GLUCAP 156* 147* 126* 151* 145*       Studies: Mr Foot Left W Wo Contrast  Result Date: 10/11/2015 CLINICAL DATA:  Poorly controlled diabetes. Hypertension. 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 Prior osteomyelitis. Worsening drainage, pain, and swelling along the wound. EXAM: MRI OF THE LEFT FOREFOOT WITHOUT AND WITH CONTRAST TECHNIQUE: Multiplanar, multisequence MR imaging was performed both before and after administration of intravenous contrast. CONTRAST:  6mL MULTIHANCE GADOBENATE DIMEGLUMINE 529 MG/ML IV SOLN COMPARISON:  10/10/2015 radiographs and MRI from 07/24/2015 FINDINGS: Large  dorsal ulceration of the forefoot extends down to the remaining base of the fifth metatarsal and its articulation with the fourth metatarsal base as shown on images 6 through 8 of series 9. This ulceration has enhancing margins favoring granulation tissue but hypoenhancement centrally indicating necrotic tissue. There is abnormal edema and enhancement in the remaining fifth metatarsal base favoring osteomyelitis. There is also abnormal edema within the bony structures along the Lisfranc joint, with associated low-grade enhancement notable in the bases of the second, third, and fourth metatarsals ; in the middle cuneiform ; and to a lesser extent in the lateral cuneiform and cuboid. Whereas there was some low-grade edema in the bases of the second and third metatarsals and middle cuneiform proximally, the amount of edema and enhancement appears increased today, especially along the cuboid and base of the fourth metatarsal. The Lisfranc ligament appears intact. There is mild periostitis proximally in the fourth metatarsal. There is dorsal subcutaneous edema and low-level edema and enhancement tracks along the plantar musculature of the foot. No significant phalangeal edema identified. Small degenerative lesion in the head of the first metatarsal at its articulation with the medial sesamoid, image 22/9. Small effusion of the first MTP joint The subcutaneous edema and enhancement extends into the toes. IMPRESSION: 1. Large ulceration/wound extends all the way down to the dorsal articulation between the bases of the fourth and fifth metatarsals. There is abnormal edema and enhancement in the remaining base of the fifth metatarsal characteristic of osteomyelitis. 2. Worsening edema and enhancement along much of the rest of the Lisfranc joint, especially the base of the fourth metatarsal and proximal shaft, as well as the bases of the second and third metatarsals and the middle and lateral cuneiform second anterior cuboid.  While some of this may be due to arthropathy, the increase particularly in the base of the fourth metatarsal is concerning for osteomyelitis, and early osteomyelitis at the second and third tarsometatarsal articulation is likewise difficult to exclude. 3. Subcutaneous edema and enhancement compatible with cellulitis. Potential mild myositis along the distal plantar foot musculature. Electronically Signed   By: Van Clines M.D.   On: 10/11/2015 08:18   Dg Foot Complete Left  Result Date: 10/10/2015 CLINICAL DATA:  Pain around the amputated region of left fifth metatarsal for 4 days. Bad smell and pus. EXAM: LEFT FOOT - COMPLETE 3+ VIEW COMPARISON:  MRI left foot 07/24/2015.  Left foot 07/23/2015 FINDINGS: Since the previous study, there has been interval amputation of the left fifth ray at the level of the proximal metatarsal. Margin of resection appears intact without evidence of significant erosion or cortical loss. Soft tissue swelling around the left forefoot at the metatarsal phalangeal region. No radiopaque soft tissue foreign bodies or gas collections. No acute fractures identified. Vascular calcifications. IMPRESSION: Soft tissue swelling. Interval amputation of the left fifth toe at the level of the proximal metatarsal. No acute bony abnormalities. Electronically Signed   By: Gwyndolyn Saxon  Gerilyn Nestle M.D.   On: 10/10/2015 20:52      Lab Results  Component Value Date   HGBA1C 13.7 (H) 07/25/2015   HGBA1C 13.6 (H) 07/24/2015   HGBA1C 13.5 (H) 07/23/2015   Lab Results  Component Value Date   MICROALBUR 2.9 (H) 07/14/2011   LDLCALC 93 04/23/2013   CREATININE 0.68 10/12/2015       Scheduled Meds: . collagenase   Topical Daily  . enoxaparin (LOVENOX) injection  40 mg Subcutaneous Q24H  . gabapentin  300 mg Oral BID  . insulin aspart  0-15 Units Subcutaneous TID WC  . insulin aspart  0-5 Units Subcutaneous QHS  . insulin glargine  10 Units Subcutaneous Q2200  . lisinopril  40 mg Oral  Daily  . piperacillin-tazobactam (ZOSYN)  IV  3.375 g Intravenous Q8H  . vancomycin  1,250 mg Intravenous Q8H   Continuous Infusions:    LOS: 1 day    Time spent: >30 MINS    [facility]  [facility] Pager [phone]. If 7PM-7AM, please contact night-coverage at [email or web address], password [facility] 10/12/2015, 10:49 AM  LOS: 1 day

## 2015-10-12 NOTE — Consult Note (Signed)
[facility] Nurse wound consult note Reason for Consult: Left foot wound Patient requesting second opinion for treatment.  Explained to patient I had reviewed his chart, notes, and recommendations of Dr. Sharol Given and do not see a need to change anything that Dr. Sharol Given has outlined for his care.  All questions of patient answered to his expressed satisfaction.  Discussed POC with patient and bedside nurse.  Re consult if needed, will not follow at this time. Thanks Val Riles MSN, RN, CNS-BC, [employer]

## 2015-10-12 NOTE — Consult Note (Addendum)
[facility]    Date of Admission:  10/10/2015           Day 2 vancomycin        Day 2 piperacillin tazobactam Reason for Consult: Diabetic foot infection    Referring Physician: Dr. [name]  Principal Problem:   Diabetic foot infection [name]) Active Problems:   Type 2 diabetes mellitus with diabetic neuropathy, with long-term current use of insulin ([place])   Essential hypertension   Acute kidney injury superimposed on chronic kidney disease ([place])   . collagenase   Topical Daily  . enoxaparin (LOVENOX) injection  40 mg Subcutaneous Q24H  . gabapentin  300 mg Oral BID  . insulin aspart  0-15 Units Subcutaneous TID WC  . insulin aspart  0-5 Units Subcutaneous QHS  . insulin glargine  10 Units Subcutaneous Q2200  . lisinopril  40 mg Oral Daily  . piperacillin-tazobactam (ZOSYN)  IV  3.375 g Intravenous Q8H  . vancomycin  1,500 mg Intravenous Q8H    Recommendations: 1. Continue current antibiotics 2. Orthopedic evaluation   Assessment: Danny Fuller has developed postoperative wound infection and his MRI suggests the possibility of extension of his osteomyelitis along the Lisfranc joint in his midfoot. I agree with current broad antibiotic therapy to cover the pathogens isolated from his surgery in June as well as broader coverage for healthcare associated gram-negative rods and anaerobes. I talked to him about the possibility of PICC placement and a long course of IV antibiotic therapy at home. He states that he is not sure that he would be able to do that himself. He would like some time to think about it. I will follow-up tomorrow.   HPI: Danny Fuller is a 40 y.o. male diabetic who developed osteomyelitis of his left foot. He underwent left fifth ray amputation and drainage of a large abscess over the dorsum of his foot on 07/26/2015. Operative cultures grew group B strep and methicillin-resistant coagulase-negative staph. He was  discharged on 07/29/2015 with a plan for doxycycline and amoxicillin clavulanate for one month. He was left with a large ulcerated area over the dorsum of the foot but did well initially. However, about one month ago he began to notice increased drainage from the ulcer. The drainage has developed a very foul odor. The wound itself started to look worse with more areas of yellow, shaggy discoloration. He was seen by Dr. Sharol Given about 2 weeks ago and started on oral doxycycline. He has continued to worsen to admission 2 days ago. A swab of the wound shows gram-positive cocci in pairs and gram-negative rods on Gram stain. Culture is growing gram-negative rods. MRI reveals some new bony enhancement along the Lisfranc joint, especially at the base of the fourth metatarsal.   Review of Systems: Review of Systems  Constitutional: Positive for chills. Negative for diaphoresis, fever, malaise/fatigue and weight loss.  HENT: Negative for sore throat.   Respiratory: Negative for cough, sputum production and shortness of breath.   Cardiovascular: Negative for chest pain.  Gastrointestinal: Negative for abdominal pain, diarrhea, nausea and vomiting.  Genitourinary: Negative for dysuria and frequency.  Musculoskeletal: Positive for joint pain. Negative for myalgias.  Skin: Negative for rash.  Neurological: Negative for headaches.    Past Medical History:  Diagnosis Date  . Asthma   . Diabetes mellitus   . GERD (gastroesophageal reflux disease)   . Headache(784.0)   . Hypertension  Social History  Substance Use Topics  . Smoking status: Former Smoker    Types: Cigarettes  . Smokeless tobacco: Never Used     Comment: quit 5 days ago  . Alcohol use No    Family History  Problem Relation Age of Onset  . Arthritis    . Diabetes    . Hypertension    . Hyperlipidemia    . Stroke    . Sudden death    . Diabetes Mother   . Diabetes Sister   . Diabetes Brother    No Known  Allergies  OBJECTIVE: Blood pressure (!) 148/88, pulse 78, temperature 98.3 F (36.8 C), temperature source Oral, resp. rate 19, height 5\' 9"  (1.753 m), weight 221 lb 3.2 oz (100.3 kg), SpO2 100 %.  Physical Exam  Constitutional: He is oriented to person, place, and time.  He is resting quietly in bed eating lunch.  Cardiovascular: Normal rate and regular rhythm.   No murmur heard. Pulmonary/Chest: Effort normal and breath sounds normal.  Musculoskeletal:  See attached picture.  Neurological: He is alert and oriented to person, place, and time.  Skin: No rash noted.  Psychiatric: Mood and affect normal.      Lab Results Lab Results  Component Value Date   WBC 8.0 10/12/2015   HGB 13.2 10/12/2015   HCT 38.8 (L) 10/12/2015   MCV 86.0 10/12/2015   PLT 302 10/12/2015    Lab Results  Component Value Date   CREATININE 0.68 10/12/2015   BUN 11 10/12/2015   NA 137 10/12/2015   K 3.7 10/12/2015   CL 108 10/12/2015   CO2 24 10/12/2015    Lab Results  Component Value Date   ALT 13 (L) 10/12/2015   AST 11 (L) 10/12/2015   ALKPHOS 64 10/12/2015   BILITOT 0.7 10/12/2015    Sed Rate (mm/hr)  Date Value  10/10/2015 6  07/25/2015 68 (H)   CRP (mg/dL)  Date Value  10/10/2015 0.6  07/25/2015 19.7 (H)   Microbiology: Recent Results (from the past 240 hour(s))  Wound or Superficial Culture     Status: None (Preliminary result)   Collection Time: 10/10/15 10:50 PM  Result Value Ref Range Status   Specimen Description WOUND LEFT FOOT  Final   Special Requests PINKY TOE AREA  Final   Gram Stain   Final    FEW WBC PRESENT, PREDOMINANTLY PMN FEW GRAM POSITIVE COCCI IN PAIRS RARE GRAM NEGATIVE RODS    Culture FEW GRAM NEGATIVE RODS  Final   Report Status PENDING  Incomplete  Culture, blood (routine x 2)     Status: None (Preliminary result)   Collection Time: 10/10/15 11:11 PM  Result Value Ref Range Status   Specimen Description BLOOD LEFT ANTECUBITAL  Final   Special  Requests BOTTLES DRAWN AEROBIC AND ANAEROBIC 5CC EA  Final   Culture NO GROWTH 1 DAY  Final   Report Status PENDING  Incomplete  Culture, blood (routine x 2)     Status: None (Preliminary result)   Collection Time: 10/10/15 11:39 PM  Result Value Ref Range Status   Specimen Description BLOOD RIGHT WRIST  Final   Special Requests BOTTLES DRAWN AEROBIC AND ANAEROBIC 10CC EA  Final   Culture NO GROWTH 1 DAY  Final   Report Status PENDING  Incomplete   MRI of left foot 10/11/2015  IMPRESSION: 1. Large ulceration/wound extends all the way down to the dorsal articulation between the bases of the fourth and fifth metatarsals.  There is abnormal edema and enhancement in the remaining base of the fifth metatarsal characteristic of osteomyelitis. 2. Worsening edema and enhancement along much of the rest of the Lisfranc joint, especially the base of the fourth metatarsal and proximal shaft, as well as the bases of the second and third metatarsals and the middle and lateral cuneiform second anterior cuboid. While some of this may be due to arthropathy, the increase particularly in the base of the fourth metatarsal is concerning for osteomyelitis, and early osteomyelitis at the second and third tarsometatarsal articulation is likewise difficult to exclude. 3. Subcutaneous edema and enhancement compatible with cellulitis. Potential mild myositis along the distal plantar foot musculature.    By: Van Clines M.D.   On: 10/11/2015 08:18  Michel Bickers, MD [facility] [phone] pager   [phone] cell 10/12/2015, 2:23 PM

## 2015-10-12 NOTE — Progress Notes (Signed)
Pharmacy Antibiotic Note  Danny Fuller is a 40 y.o. male admitted on 10/10/2015 with foot pain, odor, and drainage.  Pharmacy has been consulted for vancomycin and Zosyn dosing for osteomyelitis.  Patient's SCr is trending down.   Plan: - Change vanc to 1500mg  IV Q8H - Continue Zosyn 3.375gm IV Q8H, 4 hr infusion - Monitor renal fxn, clinical progress, vanc trough at [facility] if not switched to PO abx soon  Height: 5\' 9"  (175.3 cm) Weight: 221 lb 3.2 oz (100.3 kg) IBW/kg (Calculated) : 70.7  Temp (24hrs), Avg:98 F (36.7 C), Min:97.6 F (36.4 C), Max:98.3 F (36.8 C)   Recent Labs Lab 10/10/15 2030 10/11/15 0324 10/12/15 0655  WBC 8.3 7.9 8.0  CREATININE 0.82 0.91 0.68    Estimated Creatinine Clearance: 146.1 mL/min (by C-G formula based on SCr of 0.8 mg/dL).    No Known Allergies  Antimicrobials this admission: Vanc 8/28 >> Zosyn 8/28 >>  Dose adjustments this admission: N/A  Microbiology results: 8/27 BCx x2 - NGTD 8/27 left foot wound - GPC/GNR on Gram stain   Danny Fuller D. Mina Marble, PharmD, BCPS Pager:  [phone] 10/12/2015, 1:00 PM

## 2015-10-13 DIAGNOSIS — Z794 Long term (current) use of insulin: Secondary | ICD-10-CM

## 2015-10-13 DIAGNOSIS — L0889 Other specified local infections of the skin and subcutaneous tissue: Secondary | ICD-10-CM

## 2015-10-13 DIAGNOSIS — E1140 Type 2 diabetes mellitus with diabetic neuropathy, unspecified: Secondary | ICD-10-CM

## 2015-10-13 DIAGNOSIS — B9689 Other specified bacterial agents as the cause of diseases classified elsewhere: Secondary | ICD-10-CM

## 2015-10-13 LAB — GLUCOSE, CAPILLARY
GLUCOSE-CAPILLARY: 206 mg/dL — AB (ref 65–99)
GLUCOSE-CAPILLARY: 184 mg/dL — AB (ref 65–99)
GLUCOSE-CAPILLARY: 152 mg/dL — AB (ref 65–99)
GLUCOSE-CAPILLARY: 179 mg/dL — AB (ref 65–99)

## 2015-10-13 MED ORDER — PREGABALIN 75 MG PO CAPS
75.0000 mg | ORAL_CAPSULE | Freq: Two times a day (BID) | ORAL | Status: DC
  Administered 2015-10-13 – 2015-10-15 (×5): 75 mg via ORAL
  Filled 2015-10-13 (×5): qty 1

## 2015-10-13 MED ORDER — INSULIN GLARGINE 100 UNIT/ML ~~LOC~~ SOLN
15.0000 [IU] | Freq: Every day | SUBCUTANEOUS | Status: DC
  Administered 2015-10-13 – 2015-10-14 (×2): 15 [IU] via SUBCUTANEOUS
  Filled 2015-10-13 (×3): qty 0.15

## 2015-10-13 MED ORDER — INSULIN GLARGINE 100 UNIT/ML ~~LOC~~ SOLN
5.0000 [IU] | Freq: Once | SUBCUTANEOUS | Status: AC
  Administered 2015-10-13: 5 [IU] via SUBCUTANEOUS
  Filled 2015-10-13: qty 0.05

## 2015-10-13 NOTE — Care Management Note (Signed)
Case Management Note  Patient Details  Name: BREKKEN ZWACK MRN: [id number] Date of Birth: 1975/05/09  Subjective/Objective:                    Action/Plan:  Confirmed face sheet information with patient . Discussed home IV ABX . Patient aware he and support person will be taught how to administer medication, HHRN will not be in the home everytime medication is due .   Patient has wife who works during the day . He has an aunt who is available to assist.   Will need HHRN order and prescription once home antibiotics are determined.  Expected Discharge Date:  10/13/15               Expected Discharge Plan:  [place]  In-House Referral:     Discharge planning Services  CM Consult  Post Acute Care Choice:  Home Health Choice offered to:  Patient  DME Arranged:    DME Agency:     HH Arranged:  RN, IV Antibiotics HH Agency:  [place]  Status of Service:  In process, will continue to follow  If discussed at Long Length of Stay Meetings, dates discussed:    Additional Comments:  Marilu Favre, RN 10/13/2015, 3:49 PM

## 2015-10-13 NOTE — Progress Notes (Signed)
[facility]  Patient Status:  New pt for [facility] this admission  [facility] is providing the following services: HHRN and home infusion pharmacy team for home IV ABX.  [facility] hospital infusion coordinator will provide in hospital teaching with the pt and wife to support independence at home. [place] team will follow pt until DC to ensure [facility] and home infusion needs are met.  If patient discharges after hours, please call [phone].   Larry Sierras 10/13/2015, 5:35 PM

## 2015-10-13 NOTE — Progress Notes (Signed)
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 Wound infection: New pain, swelling, foul-smelling drainage from surgical wound. Patient seen by Dr. Sharol Given,  as well as Dr. Megan Salon Previous wound culture showed STREPTOCOCCUS AGALACTIAE  and staph coag negative   MRSA screen negative and no personal history of resistant infections.    ABIs WNL during last hospitalization. Blood sugars well controlled since last hospitalization. Has stopped smoking.  -Follow up MRI -Confirms osteomyelitis/myositis/  abscess, Dr. Sharol Given recommended antibiotics, Beyond the scope of wound care Continue vancomycin and Zosyn, infectious disease may recommend surgical debridement May need PICC line placement, SNF placement for long-term antibiotics    2. IDDM:Accu-Chek stable -Increase Lantus to 15 units nightly (decreased by endocrine recently) -SSI as needed -Hold metformin and glipizide in hospital  3. HTN: -Continue lisinopril 40 mg daily (changed by endocrine recently)  4. Former smoking: -Nicotine gum, PRN  5. Diabetic neuropathy-started patient on Lyrica, gabapentin not working      DVT prophylaxsis Lovenox  Code Status:  Full code    Family Communication: Discussed in detail with the patient, all imaging results, lab results explained to the patient    Disposition Plan:  As per orthopedics and infectious disease     Consultants:  Infectious disease  Orthopedics  Procedures:  None  Antibiotics: Anti-infectives    Start     Dose/Rate Route Frequency Ordered Stop   10/11/15 1300  vancomycin (VANCOCIN) 1,250 mg in sodium chloride 0.9 % 250 mL IVPB     1,250 mg 166.7 mL/hr over 90 Minutes Intravenous Every 8 hours 10/11/15 1147     10/11/15 1300  piperacillin-tazobactam (ZOSYN) IVPB 3.375 g     3.375 g 12.5 mL/hr over 240 Minutes Intravenous Every 8 hours 10/11/15 1147        HPI/Subjective: Patient concerned about picc line placement , still has pain in both feet Objective: Vitals:   10/11/15 2047 10/12/15 0532 10/12/15 2108 10/13/15 0526  BP: (!) 164/80 (!) 148/88 (!) 154/89 (!) 154/86  Pulse: 83 78 78 85  Resp: 19 19 19 19   Temp: 98.1 F (36.7 C) 98.3 F (36.8 C) 98.3 F (36.8 C) 98.3 F (36.8 C)  TempSrc: Oral Oral Oral Oral  SpO2: 100% 100% 100% 100%  Weight:      Height:        Intake/Output Summary (Last 24 hours) at 10/13/15 1042 Last data filed at 10/13/15 1040  Gross per 24 hour  Intake             1894 ml  Output             2000 ml  Net             -106 ml    Exam:  Examination:  General exam: Appears calm and comfortable  Respiratory system: Clear to auscultation. Respiratory effort normal. Cardiovascular system:  S1 & S2 heard, RRR. No JVD, murmurs, rubs, gallops or clicks. No pedal edema. Gastrointestinal system: Abdomen is nondistended, soft and nontender. No organomegaly or masses felt. Normal bowel sounds heard. Central nervous system: Alert and oriented. No focal neurological deficits. Extremities: Symmetric 5 x 5 power. Skin: No rashes, lesions or ulcers Psychiatry: Judgement and insight appear normal. Mood & affect appropriate.     Data Reviewed: I have personally reviewed following labs and imaging studies  Micro Results Recent Results (from the past 240 hour(s))  Wound or  Superficial Culture     Status: None (Preliminary result)   Collection Time: 10/10/15 10:50 PM  Result Value Ref Range Status   Specimen Description WOUND LEFT FOOT  Final   Special Requests PINKY TOE AREA  Final   Gram Stain   Final    FEW WBC PRESENT, PREDOMINANTLY PMN FEW GRAM POSITIVE COCCI IN PAIRS RARE GRAM NEGATIVE RODS    Culture FEW GRAM NEGATIVE RODS  Final   Report Status PENDING  Incomplete  Culture, blood (routine x 2)     Status: None (Preliminary result)   Collection Time: 10/10/15 11:11 PM  Result Value Ref Range Status   Specimen Description BLOOD LEFT ANTECUBITAL  Final   Special Requests BOTTLES DRAWN AEROBIC AND ANAEROBIC 5CC EA  Final   Culture NO GROWTH 1 DAY  Final   Report Status PENDING  Incomplete  Culture, blood (routine x 2)     Status: None (Preliminary result)   Collection Time: 10/10/15 11:39 PM  Result Value Ref Range Status   Specimen Description BLOOD RIGHT WRIST  Final   Special Requests BOTTLES DRAWN AEROBIC AND ANAEROBIC 10CC EA  Final   Culture NO GROWTH 1 DAY  Final   Report Status PENDING  Incomplete    Radiology Reports Mr Foot Left W Wo Contrast  Result Date: 10/11/2015 CLINICAL DATA:  Poorly controlled diabetes. Hypertension. 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 Prior osteomyelitis. Worsening drainage, pain, and swelling along the wound. EXAM: MRI OF THE LEFT FOREFOOT WITHOUT AND WITH CONTRAST TECHNIQUE: Multiplanar, multisequence MR imaging was performed both before and after administration of intravenous contrast. CONTRAST:  27mL MULTIHANCE GADOBENATE DIMEGLUMINE 529 MG/ML IV SOLN COMPARISON:  10/10/2015 radiographs and MRI from 07/24/2015 FINDINGS: Large dorsal ulceration of the forefoot extends down to the remaining base of the fifth metatarsal and its articulation with the fourth metatarsal base as shown on images 6 through 8 of series 9. This ulceration has enhancing margins favoring granulation tissue but hypoenhancement centrally  indicating necrotic tissue. There is abnormal edema and enhancement in the remaining fifth metatarsal base favoring osteomyelitis. There is also abnormal edema within the bony structures along the Lisfranc joint, with associated low-grade enhancement notable in the bases of the second, third, and fourth metatarsals ; in the middle cuneiform ; and to a lesser extent in the lateral cuneiform and cuboid. Whereas there was some low-grade edema in the bases of the second and third metatarsals and middle cuneiform proximally, the amount of edema and enhancement appears increased today, especially along the cuboid and base of the fourth metatarsal. The Lisfranc ligament appears intact. There is mild periostitis proximally in the fourth metatarsal. There is dorsal subcutaneous edema and low-level edema and enhancement tracks along the plantar musculature of the foot. No significant phalangeal edema identified. Small degenerative lesion in the head of the first metatarsal at its articulation with the medial sesamoid, image 22/9. Small effusion of the first MTP joint The subcutaneous edema and  enhancement extends into the toes. IMPRESSION: 1. Large ulceration/wound extends all the way down to the dorsal articulation between the bases of the fourth and fifth metatarsals. There is abnormal edema and enhancement in the remaining base of the fifth metatarsal characteristic of osteomyelitis. 2. Worsening edema and enhancement along much of the rest of the Lisfranc joint, especially the base of the fourth metatarsal and proximal shaft, as well as the bases of the second and third metatarsals and the middle and lateral cuneiform second anterior cuboid. While some of this may be due to arthropathy, the increase particularly in the base of the fourth metatarsal is concerning for osteomyelitis, and early osteomyelitis at the second and third tarsometatarsal articulation is likewise difficult to exclude. 3. Subcutaneous edema and  enhancement compatible with cellulitis. Potential mild myositis along the distal plantar foot musculature. Electronically Signed   By: Van Clines M.D.   On: 10/11/2015 08:18   Dg Foot Complete Left  Result Date: 10/10/2015 CLINICAL DATA:  Pain around the amputated region of left fifth metatarsal for 4 days. Bad smell and pus. EXAM: LEFT FOOT - COMPLETE 3+ VIEW COMPARISON:  MRI left foot 07/24/2015.  Left foot 07/23/2015 FINDINGS: Since the previous study, there has been interval amputation of the left fifth ray at the level of the proximal metatarsal. Margin of resection appears intact without evidence of significant erosion or cortical loss. Soft tissue swelling around the left forefoot at the metatarsal phalangeal region. No radiopaque soft tissue foreign bodies or gas collections. No acute fractures identified. Vascular calcifications. IMPRESSION: Soft tissue swelling. Interval amputation of the left fifth toe at the level of the proximal metatarsal. No acute bony abnormalities. Electronically Signed   By: Lucienne Capers M.D.   On: 10/10/2015 20:52     CBC  Recent Labs Lab 10/10/15 2030 10/11/15 0324 10/12/15 0655  WBC 8.3 7.9 8.0  HGB 13.9 12.2* 13.2  HCT 40.8 37.1* 38.8*  PLT 340 308 302  MCV 86.6 87.5 86.0  MCH 29.5 28.8 29.3  MCHC 34.1 32.9 34.0  RDW 13.4 13.5 13.3  LYMPHSABS 4.0  --   --   MONOABS 0.6  --   --   EOSABS 0.1  --   --   BASOSABS 0.1  --   --     Chemistries   Recent Labs Lab 10/10/15 2030 10/11/15 0324 10/12/15 0655  NA 139 138 137  K 4.0 3.7 3.7  CL 107 106 108  CO2 23 24 24   GLUCOSE 136* 147* 146*  BUN 10 15 11   CREATININE 0.82 0.91 0.68  CALCIUM 9.1 8.9 9.0  AST 16  --  11*  ALT 16*  --  13*  ALKPHOS 83  --  64  BILITOT 1.2  --  0.7   ------------------------------------------------------------------------------------------------------------------ estimated creatinine clearance is 146.1 mL/min (by C-G formula based on SCr of 0.8  mg/dL). ------------------------------------------------------------------------------------------------------------------ No results for input(s): HGBA1C in the last 72 hours. ------------------------------------------------------------------------------------------------------------------ No results for input(s): CHOL, HDL, LDLCALC, TRIG, CHOLHDL, LDLDIRECT in the last 72 hours. ------------------------------------------------------------------------------------------------------------------ No results for input(s): TSH, T4TOTAL, T3FREE, THYROIDAB in the last 72 hours.  Invalid input(s): FREET3 ------------------------------------------------------------------------------------------------------------------ No results for input(s): VITAMINB12, FOLATE, FERRITIN, TIBC, IRON, RETICCTPCT in the last 72 hours.  Coagulation profile No results for input(s): INR, PROTIME in the last 168 hours.  No results for input(s): DDIMER in the last 72 hours.  Cardiac Enzymes No results for input(s): CKMB, TROPONINI, MYOGLOBIN in the last 168 hours.  Invalid input(s): CK ------------------------------------------------------------------------------------------------------------------  Invalid input(s): POCBNP   CBG:  Recent Labs Lab 10/12/15 0747 10/12/15 1137 10/12/15 1652 10/12/15 2141 10/13/15 0742  GLUCAP 145* 184* 153* 202* 206*       Studies: No results found.    Lab Results  Component Value Date   HGBA1C 13.7 (H) 07/25/2015   HGBA1C 13.6 (H) 07/24/2015   HGBA1C 13.5 (H) 07/23/2015   Lab Results  Component Value Date   MICROALBUR 2.9 (H) 07/14/2011   LDLCALC 93 04/23/2013   CREATININE 0.68 10/12/2015       Scheduled Meds: . collagenase   Topical Daily  . enoxaparin (LOVENOX) injection  40 mg Subcutaneous Q24H  . gabapentin  300 mg Oral BID  . insulin aspart  0-15 Units Subcutaneous TID WC  . insulin aspart  0-5 Units Subcutaneous QHS  . insulin glargine  10 Units  Subcutaneous Q2200  . lisinopril  40 mg Oral Daily  . piperacillin-tazobactam (ZOSYN)  IV  3.375 g Intravenous Q8H  . pregabalin  75 mg Oral BID  . vancomycin  1,500 mg Intravenous Q8H   Continuous Infusions:    LOS: 2 days    Time spent: >30 MINS    [facility]  [facility] Pager [phone]. If 7PM-7AM, please contact night-coverage at [email or web address], password [facility] 10/13/2015, 10:42 AM  LOS: 2 days

## 2015-10-13 NOTE — Progress Notes (Signed)
Patient ID: Danny Fuller, male   DOB: [birth year], 40 y.o.   MRN: [id number]          [facility]    Date of Admission:  10/10/2015           Day 3 vancomycin        Day 3 piperacillin tazobactam  Principal Problem:   Diabetic foot infection (HCC) Active Problems:   Type 2 diabetes mellitus with diabetic neuropathy, with long-term current use of insulin (HCC)   Essential hypertension   Acute kidney injury superimposed on chronic kidney disease ([place])   . collagenase   Topical Daily  . enoxaparin (LOVENOX) injection  40 mg Subcutaneous Q24H  . gabapentin  300 mg Oral BID  . insulin aspart  0-15 Units Subcutaneous TID WC  . insulin aspart  0-5 Units Subcutaneous QHS  . insulin glargine  15 Units Subcutaneous Q2200  . lisinopril  40 mg Oral Daily  . piperacillin-tazobactam (ZOSYN)  IV  3.375 g Intravenous Q8H  . pregabalin  75 mg Oral BID  . vancomycin  1,500 mg Intravenous Q8H    SUBJECTIVE: He is now agreeable with PICC placement for prolonged IV antibiotic therapy. He tells me that "I must do it if I want to save my foot". He is not having as much pain in his foot.  Review of Systems: Review of Systems  Constitutional: Negative for chills, diaphoresis and fever.  Musculoskeletal: Positive for joint pain.    Past Medical History:  Diagnosis Date  . Asthma   . Diabetes mellitus   . GERD (gastroesophageal reflux disease)   . Headache(784.0)   . Hypertension     Social History  Substance Use Topics  . Smoking status: Former Smoker    Types: Cigarettes  . Smokeless tobacco: Never Used     Comment: quit 5 days ago  . Alcohol use No    Family History  Problem Relation Age of Onset  . Arthritis    . Diabetes    . Hypertension    . Hyperlipidemia    . Stroke    . Sudden death    . Diabetes Mother   . Diabetes Sister   . Diabetes Brother    No Known Allergies  OBJECTIVE: Vitals:   10/12/15 0532 10/12/15 2108 10/13/15 0526  10/13/15 1300  BP: (!) 148/88 (!) 154/89 (!) 154/86 (!) 165/91  Pulse: 78 78 85 84  Resp: 19 19 19 18   Temp: 98.3 F (36.8 C) 98.3 F (36.8 C) 98.3 F (36.8 C) 98.4 F (36.9 C)  TempSrc: Oral Oral Oral Oral  SpO2: 100% 100% 100% 100%  Weight:      Height:       Body mass index is 32.67 kg/m.  Physical Exam  Constitutional: He is oriented to person, place, and time.  He is in good spirits. He has lots of questions.  Cardiovascular: Normal rate and regular rhythm.   No murmur heard. Pulmonary/Chest: Effort normal and breath sounds normal.  Musculoskeletal:  His left foot is wrapped.  Neurological: He is alert and oriented to person, place, and time.  Skin: No rash noted.  Psychiatric: Mood and affect normal.    Lab Results Lab Results  Component Value Date   WBC 8.0 10/12/2015   HGB 13.2 10/12/2015   HCT 38.8 (L) 10/12/2015   MCV 86.0 10/12/2015   PLT 302 10/12/2015    Lab Results  Component Value Date   CREATININE 0.68  10/12/2015   BUN 11 10/12/2015   NA 137 10/12/2015   K 3.7 10/12/2015   CL 108 10/12/2015   CO2 24 10/12/2015    Lab Results  Component Value Date   ALT 13 (L) 10/12/2015   AST 11 (L) 10/12/2015   ALKPHOS 64 10/12/2015   BILITOT 0.7 10/12/2015     Microbiology: Recent Results (from the past 240 hour(s))  Wound or Superficial Culture     Status: None (Preliminary result)   Collection Time: 10/10/15 10:50 PM  Result Value Ref Range Status   Specimen Description WOUND LEFT FOOT  Final   Special Requests PINKY TOE AREA  Final   Gram Stain   Final    FEW WBC PRESENT, PREDOMINANTLY PMN FEW GRAM POSITIVE COCCI IN PAIRS RARE GRAM NEGATIVE RODS    Culture FEW GRAM NEGATIVE RODS  Final   Report Status PENDING  Incomplete  Culture, blood (routine x 2)     Status: None (Preliminary result)   Collection Time: 10/10/15 11:11 PM  Result Value Ref Range Status   Specimen Description BLOOD LEFT ANTECUBITAL  Final   Special Requests BOTTLES DRAWN  AEROBIC AND ANAEROBIC 5CC EA  Final   Culture NO GROWTH 2 DAYS  Final   Report Status PENDING  Incomplete  Culture, blood (routine x 2)     Status: None (Preliminary result)   Collection Time: 10/10/15 11:39 PM  Result Value Ref Range Status   Specimen Description BLOOD RIGHT WRIST  Final   Special Requests BOTTLES DRAWN AEROBIC AND ANAEROBIC 10CC EA  Final   Culture NO GROWTH 2 DAYS  Final   Report Status PENDING  Incomplete     ASSESSMENT: Mr. Shaner is feeling a little bit better on broad empiric therapy for presumed aerobic anaerobic diabetic foot infection. He is now agreeable for PICC placement. I will continue vancomycin and piperacillin tazobactam pending his final wound culture.  PLAN: 1. Continue current antibiotics 2. Await final cultures 3. PICC placement  Michel Bickers, MD [facility] for [facility] [phone] pager   [phone] cell 10/13/2015, 2:53 PM

## 2015-10-14 ENCOUNTER — Inpatient Hospital Stay (HOSPITAL_COMMUNITY): Payer: BC Managed Care – PPO

## 2015-10-14 ENCOUNTER — Ambulatory Visit: Payer: BC Managed Care – PPO | Admitting: Endocrinology

## 2015-10-14 LAB — COMPREHENSIVE METABOLIC PANEL
ALT: 15 U/L — AB (ref 17–63)
AST: 11 U/L — AB (ref 15–41)
Albumin: 3.2 g/dL — ABNORMAL LOW (ref 3.5–5.0)
Alkaline Phosphatase: 57 U/L (ref 38–126)
Anion gap: 5 (ref 5–15)
BILIRUBIN TOTAL: 0.8 mg/dL (ref 0.3–1.2)
BUN: 9 mg/dL (ref 6–20)
CALCIUM: 9 mg/dL (ref 8.9–10.3)
CO2: 25 mmol/L (ref 22–32)
CREATININE: 0.73 mg/dL (ref 0.61–1.24)
Chloride: 107 mmol/L (ref 101–111)
Glucose, Bld: 118 mg/dL — ABNORMAL HIGH (ref 65–99)
Potassium: 3.9 mmol/L (ref 3.5–5.1)
Sodium: 137 mmol/L (ref 135–145)
TOTAL PROTEIN: 6 g/dL — AB (ref 6.5–8.1)

## 2015-10-14 LAB — CBC
HCT: 37 % — ABNORMAL LOW (ref 39.0–52.0)
Hemoglobin: 12.3 g/dL — ABNORMAL LOW (ref 13.0–17.0)
MCH: 28.7 pg (ref 26.0–34.0)
MCHC: 33.2 g/dL (ref 30.0–36.0)
MCV: 86.4 fL (ref 78.0–100.0)
PLATELETS: 325 10*3/uL (ref 150–400)
RBC: 4.28 MIL/uL (ref 4.22–5.81)
RDW: 13.2 % (ref 11.5–15.5)
WBC: 8.1 10*3/uL (ref 4.0–10.5)

## 2015-10-14 LAB — GLUCOSE, CAPILLARY
GLUCOSE-CAPILLARY: 113 mg/dL — AB (ref 65–99)
GLUCOSE-CAPILLARY: 135 mg/dL — AB (ref 65–99)
Glucose-Capillary: 172 mg/dL — ABNORMAL HIGH (ref 65–99)
Glucose-Capillary: 171 mg/dL — ABNORMAL HIGH (ref 65–99)

## 2015-10-14 LAB — VANCOMYCIN, TROUGH: VANCOMYCIN TR: 19 ug/mL (ref 15–20)

## 2015-10-14 MED ORDER — SODIUM CHLORIDE 0.9% FLUSH: 10.0000 mL | INTRAVENOUS | Status: DC | PRN

## 2015-10-14 MED ORDER — ERTAPENEM SODIUM 1 G IJ SOLR
1.0000 g | INTRAMUSCULAR | Status: DC
  Administered 2015-10-14 – 2015-10-15 (×2): 1 g via INTRAVENOUS
  Filled 2015-10-14 (×2): qty 1

## 2015-10-14 NOTE — Progress Notes (Signed)
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 Wound infection: New pain, swelling, foul-smelling drainage from surgical wound. Patient seen by Dr. Sharol Given,  as well as Dr. Megan Salon  Wound culture from 8/27 shows [place] screen negative and no personal history of resistant infections.    ABIs WNL during last hospitalization. Blood sugars well controlled since last hospitalization. Has stopped smoking.  -Follow up MRI -Confirms osteomyelitis/myositis/  abscess, Dr. Sharol Given recommended antibiotics, Beyond the scope of wound care Continue vancomycin and Zosyn, infectious disease to continue making recommendations about antibiotics May need PICC line placement, SNF placement for long-term antibiotics    2. IDDM:Accu-Chek improved Continue Lantus to 15 units nightly (decreased by endocrine recently) -SSI as needed -Hold metformin and glipizide in hospital  3. HTN: -Continue lisinopril 40 mg daily (changed by endocrine recently)  4. Former smoking: -Nicotine gum, PRN  5. Diabetic neuropathy-started patient on Lyrica, gabapentin not working      DVT prophylaxsis Lovenox  Code Status:  Full code    Family Communication: Discussed in detail with the patient, all imaging results, lab results explained to  the patient   Disposition Plan:   Anticipate discharge tomorrow     Consultants:  Infectious disease  Orthopedics  Procedures:  None  Antibiotics: Anti-infectives    Start     Dose/Rate Route Frequency Ordered Stop   10/11/15 1300  vancomycin (VANCOCIN) 1,250 mg in sodium chloride 0.9 % 250 mL IVPB     1,250 mg 166.7 mL/hr over 90 Minutes Intravenous Every 8 hours 10/11/15 1147     10/11/15 1300  piperacillin-tazobactam (ZOSYN) IVPB 3.375 g     3.375 g 12.5 mL/hr over 240 Minutes Intravenous Every 8 hours 10/11/15 1147        HPI/Subjective: Status post PICC line placement  Objective: Vitals:   10/13/15 2159 10/14/15 0500 10/14/15 0542 10/14/15 0824  BP: (!) 180/101 (!) 169/101 (!) 176/96 (!) 173/99  Pulse: 85  78 81  Resp: 19  19 16   Temp: 98.5 F (36.9 C)  97.5 F (36.4 C) 97.9 F (36.6 C)  TempSrc: Oral  Oral Oral  SpO2: 100%  100% 100%  Weight:      Height:        Intake/Output Summary (Last 24 hours) at 10/14/15 1143 Last data filed at 10/14/15 0912  Gross per 24 hour  Intake          2197.34 ml  Output             2870 ml  Net          -672.66 ml    Exam:  Examination:  General exam: Appears calm and comfortable  Respiratory system: Clear to auscultation. Respiratory effort normal. Cardiovascular system: S1 & S2 heard, RRR. No JVD, murmurs, rubs, gallops or clicks. No pedal edema. Gastrointestinal system:  Abdomen is nondistended, soft and nontender. No organomegaly or masses felt. Normal bowel sounds heard. Central nervous system: Alert and oriented. No focal neurological deficits. Extremities: Symmetric 5 x 5 power. Skin: No rashes, lesions or ulcers Psychiatry: Judgement and insight appear normal. Mood & affect appropriate.     Data Reviewed: I have personally reviewed following labs and imaging studies  Micro Results Recent Results (from the past 240 hour(s))  Wound or Superficial Culture     Status: None (Preliminary result)    Collection Time: 10/10/15 10:50 PM  Result Value Ref Range Status   Specimen Description WOUND LEFT FOOT  Final   Special Requests PINKY TOE AREA  Final   Gram Stain   Final    FEW WBC PRESENT, PREDOMINANTLY PMN FEW GRAM POSITIVE COCCI IN PAIRS RARE GRAM NEGATIVE RODS    Culture   Final    FEW KLEBSIELLA ORNITHINOLYTICA STAPHYLOCOCCUS AUREUS    Report Status PENDING  Incomplete   Organism ID, Bacteria STAPHYLOCOCCUS AUREUS  Final      Susceptibility   Staphylococcus aureus - MIC*    CIPROFLOXACIN <=0.5 SENSITIVE Sensitive     ERYTHROMYCIN <=0.25 SENSITIVE Sensitive     GENTAMICIN <=0.5 SENSITIVE Sensitive     OXACILLIN 0.5 SENSITIVE Sensitive     TETRACYCLINE <=1 SENSITIVE Sensitive     VANCOMYCIN 1 SENSITIVE Sensitive     TRIMETH/SULFA <=10 SENSITIVE Sensitive     CLINDAMYCIN <=0.25 SENSITIVE Sensitive     RIFAMPIN <=0.5 SENSITIVE Sensitive     Inducible Clindamycin NEGATIVE Sensitive     * STAPHYLOCOCCUS AUREUS  Culture, blood (routine x 2)     Status: None (Preliminary result)   Collection Time: 10/10/15 11:11 PM  Result Value Ref Range Status   Specimen Description BLOOD LEFT ANTECUBITAL  Final   Special Requests BOTTLES DRAWN AEROBIC AND ANAEROBIC 5CC EA  Final   Culture NO GROWTH 2 DAYS  Final   Report Status PENDING  Incomplete  Culture, blood (routine x 2)     Status: None (Preliminary result)   Collection Time: 10/10/15 11:39 PM  Result Value Ref Range Status   Specimen Description BLOOD RIGHT WRIST  Final   Special Requests BOTTLES DRAWN AEROBIC AND ANAEROBIC 10CC EA  Final   Culture NO GROWTH 2 DAYS  Final   Report Status PENDING  Incomplete    Radiology Reports Mr Foot Left W Wo Contrast  Result Date: 10/11/2015 CLINICAL DATA:  Poorly controlled diabetes. Hypertension. 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 Prior osteomyelitis. Worsening drainage, pain, and swelling along the wound. EXAM: MRI OF THE LEFT FOREFOOT WITHOUT AND WITH CONTRAST TECHNIQUE:  Multiplanar, multisequence MR imaging was performed both before and after administration of intravenous contrast. CONTRAST:  5mL MULTIHANCE GADOBENATE DIMEGLUMINE 529 MG/ML IV SOLN COMPARISON:  10/10/2015 radiographs and MRI from 07/24/2015 FINDINGS: Large dorsal ulceration of the forefoot extends down to the remaining base of the fifth metatarsal and its articulation with the fourth metatarsal base as shown on images 6 through 8 of series 9. This ulceration has enhancing margins favoring granulation tissue but hypoenhancement centrally indicating necrotic tissue. There is abnormal edema and enhancement in the remaining fifth metatarsal base favoring osteomyelitis. There is also abnormal edema within the bony structures along the Lisfranc joint, with associated low-grade enhancement notable in the bases of the second, third, and fourth metatarsals ; in the middle cuneiform ; and to a lesser extent in the lateral cuneiform and cuboid. Whereas there was some low-grade edema in the bases  of the second and third metatarsals and middle cuneiform proximally, the amount of edema and enhancement appears increased today, especially along the cuboid and base of the fourth metatarsal. The Lisfranc ligament appears intact. There is mild periostitis proximally in the fourth metatarsal. There is dorsal subcutaneous edema and low-level edema and enhancement tracks along the plantar musculature of the foot. No significant phalangeal edema identified. Small degenerative lesion in the head of the first metatarsal at its articulation with the medial sesamoid, image 22/9. Small effusion of the first MTP joint The subcutaneous edema and enhancement extends into the toes. IMPRESSION: 1. Large ulceration/wound extends all the way down to the dorsal articulation between the bases of the fourth and fifth metatarsals. There is abnormal edema and enhancement in the remaining base of the fifth metatarsal characteristic of osteomyelitis. 2.  Worsening edema and enhancement along much of the rest of the Lisfranc joint, especially the base of the fourth metatarsal and proximal shaft, as well as the bases of the second and third metatarsals and the middle and lateral cuneiform second anterior cuboid. While some of this may be due to arthropathy, the increase particularly in the base of the fourth metatarsal is concerning for osteomyelitis, and early osteomyelitis at the second and third tarsometatarsal articulation is likewise difficult to exclude. 3. Subcutaneous edema and enhancement compatible with cellulitis. Potential mild myositis along the distal plantar foot musculature. Electronically Signed   By: Van Clines M.D.   On: 10/11/2015 08:18   Dg Chest Port 1 View  Result Date: 10/14/2015 CLINICAL DATA:  Line placement EXAM: PORTABLE CHEST 1 VIEW COMPARISON:  02/08/2012 FINDINGS: Right arm PICC tip in the mid right atrium. Recommend withdrawal 5 cm. Lungs remain clear without infiltrate or effusion. IMPRESSION: PICC tip in the mid right atrium, recommend withdrawal 5 cm. Electronically Signed   By: Franchot Gallo M.D.   On: 10/14/2015 11:06   Dg Foot Complete Left  Result Date: 10/10/2015 CLINICAL DATA:  Pain around the amputated region of left fifth metatarsal for 4 days. Bad smell and pus. EXAM: LEFT FOOT - COMPLETE 3+ VIEW COMPARISON:  MRI left foot 07/24/2015.  Left foot 07/23/2015 FINDINGS: Since the previous study, there has been interval amputation of the left fifth ray at the level of the proximal metatarsal. Margin of resection appears intact without evidence of significant erosion or cortical loss. Soft tissue swelling around the left forefoot at the metatarsal phalangeal region. No radiopaque soft tissue foreign bodies or gas collections. No acute fractures identified. Vascular calcifications. IMPRESSION: Soft tissue swelling. Interval amputation of the left fifth toe at the level of the proximal metatarsal. No acute bony  abnormalities. Electronically Signed   By: Lucienne Capers M.D.   On: 10/10/2015 20:52     CBC  Recent Labs Lab 10/10/15 2030 10/11/15 0324 10/12/15 0655 10/14/15 0500  WBC 8.3 7.9 8.0 8.1  HGB 13.9 12.2* 13.2 12.3*  HCT 40.8 37.1* 38.8* 37.0*  PLT 340 308 302 325  MCV 86.6 87.5 86.0 86.4  MCH 29.5 28.8 29.3 28.7  MCHC 34.1 32.9 34.0 33.2  RDW 13.4 13.5 13.3 13.2  LYMPHSABS 4.0  --   --   --   MONOABS 0.6  --   --   --   EOSABS 0.1  --   --   --   BASOSABS 0.1  --   --   --     Chemistries   Recent Labs Lab 10/10/15 2030 10/11/15 0324 10/12/15 0655 10/14/15 0500  NA 139 138 137 137  K 4.0 3.7 3.7 3.9  CL 107 106 108 107  CO2 23 24 24 25   GLUCOSE 136* 147* 146* 118*  BUN 10 15 11 9   CREATININE 0.82 0.91 0.68 0.73  CALCIUM 9.1 8.9 9.0 9.0  AST 16  --  11* 11*  ALT 16*  --  13* 15*  ALKPHOS 83  --  64 57  BILITOT 1.2  --  0.7 0.8   ------------------------------------------------------------------------------------------------------------------ estimated creatinine clearance is 146.1 mL/min (by C-G formula based on SCr of 0.8 mg/dL). ------------------------------------------------------------------------------------------------------------------ No results for input(s): HGBA1C in the last 72 hours. ------------------------------------------------------------------------------------------------------------------ No results for input(s): CHOL, HDL, LDLCALC, TRIG, CHOLHDL, LDLDIRECT in the last 72 hours. ------------------------------------------------------------------------------------------------------------------ No results for input(s): TSH, T4TOTAL, T3FREE, THYROIDAB in the last 72 hours.  Invalid input(s): FREET3 ------------------------------------------------------------------------------------------------------------------ No results for input(s): VITAMINB12, FOLATE, FERRITIN, TIBC, IRON, RETICCTPCT in the last 72 hours.  Coagulation profile No  results for input(s): INR, PROTIME in the last 168 hours.  No results for input(s): DDIMER in the last 72 hours.  Cardiac Enzymes No results for input(s): CKMB, TROPONINI, MYOGLOBIN in the last 168 hours.  Invalid input(s): CK ------------------------------------------------------------------------------------------------------------------ Invalid input(s): POCBNP   CBG:  Recent Labs Lab 10/13/15 0742 10/13/15 1137 10/13/15 1707 10/13/15 2149 10/14/15 0758  GLUCAP 206* 184* 152* 179* 113*       Studies: Dg Chest Port 1 View  Result Date: 10/14/2015 CLINICAL DATA:  Line placement EXAM: PORTABLE CHEST 1 VIEW COMPARISON:  02/08/2012 FINDINGS: Right arm PICC tip in the mid right atrium. Recommend withdrawal 5 cm. Lungs remain clear without infiltrate or effusion. IMPRESSION: PICC tip in the mid right atrium, recommend withdrawal 5 cm. Electronically Signed   By: Franchot Gallo M.D.   On: 10/14/2015 11:06      Lab Results  Component Value Date   HGBA1C 13.7 (H) 07/25/2015   HGBA1C 13.6 (H) 07/24/2015   HGBA1C 13.5 (H) 07/23/2015   Lab Results  Component Value Date   MICROALBUR 2.9 (H) 07/14/2011   LDLCALC 93 04/23/2013   CREATININE 0.73 10/14/2015       Scheduled Meds: . collagenase   Topical Daily  . enoxaparin (LOVENOX) injection  40 mg Subcutaneous Q24H  . gabapentin  300 mg Oral BID  . insulin aspart  0-15 Units Subcutaneous TID WC  . insulin aspart  0-5 Units Subcutaneous QHS  . insulin glargine  15 Units Subcutaneous Q2200  . lisinopril  40 mg Oral Daily  . piperacillin-tazobactam (ZOSYN)  IV  3.375 g Intravenous Q8H  . pregabalin  75 mg Oral BID  . vancomycin  1,500 mg Intravenous Q8H   Continuous Infusions:    LOS: 3 days    Time spent: >30 MINS    [facility]  [facility] Pager [phone]. If 7PM-7AM, please contact night-coverage at [email or web address], password [facility] 10/14/2015, 11:43 AM  LOS: 3 days

## 2015-10-14 NOTE — Progress Notes (Signed)
Peripherally Inserted Central Catheter/Midline Placement  The IV Nurse has discussed with the patient and/or persons authorized to consent for the patient, the purpose of this procedure and the potential benefits and risks involved with this procedure.  The benefits include less needle sticks, lab draws from the catheter, ability to peform PICC exchange if ordered by the physician and patient may be discharged home with the catheter.  Risks include, but not limited to, infection, bleeding, blood clot (thrombus formation), and puncture of an artery; nerve damage and irregular heat beat.  Alternatives to this procedure were also discussed.  Bard educational packet left with pt.  PICC/Midline Placement Documentation  PICC Single Lumen 10/14/15 PICC Right Brachial 48 cm 0 cm (Active)  Indication for Insertion or Continuance of Line Home intravenous therapies (PICC only) 10/14/2015 10:00 AM  Exposed Catheter (cm) 0 cm 10/14/2015 10:00 AM  Site Assessment Clean;Dry;Intact 10/14/2015 10:00 AM  Line Status Flushed;Saline locked;Blood return noted 10/14/2015 10:00 AM  Dressing Type Transparent 10/14/2015 10:00 AM  Dressing Status Clean;Dry;Intact;Antimicrobial disc in place 10/14/2015 10:00 AM  Line Care Connections checked and tightened 10/14/2015 10:00 AM  Line Adjustment (NICU/IV Team Only) No 10/14/2015 10:00 AM  Dressing Intervention New dressing 10/14/2015 10:00 AM  Dressing Change Due 10/21/15 10/14/2015 10:00 AM       Rolena Infante 10/14/2015, 10:32 AM

## 2015-10-14 NOTE — Progress Notes (Signed)
Patient ID: Danny Fuller, male   DOB: 1975/09/23, 40 y.o.   MRN: [id number]          [facility] for Infectious Disease    Date of Admission:  10/10/2015           Day 4 vancomycin        Day 4 piperacillin tazobactam  Principal Problem:   Diabetic foot infection (HCC) Active Problems:   Type 2 diabetes mellitus with diabetic neuropathy, with long-term current use of insulin (HCC)   Essential hypertension   Acute kidney injury superimposed on chronic kidney disease ([place])   . collagenase   Topical Daily  . enoxaparin (LOVENOX) injection  40 mg Subcutaneous Q24H  . gabapentin  300 mg Oral BID  . insulin aspart  0-15 Units Subcutaneous TID WC  . insulin aspart  0-5 Units Subcutaneous QHS  . insulin glargine  15 Units Subcutaneous Q2200  . lisinopril  40 mg Oral Daily  . piperacillin-tazobactam (ZOSYN)  IV  3.375 g Intravenous Q8H  . pregabalin  75 mg Oral BID  . vancomycin  1,500 mg Intravenous Q8H    SUBJECTIVE: He had no problem with PICC placement yesterday. His left foot is feeling better and he states that the foul smelling drainage has stopped. He plans to go live with his aunt following discharge so she can help him with IV antibiotics while his wife is working.  Review of Systems: Review of Systems  Constitutional: Negative for chills, diaphoresis and fever.  Musculoskeletal: Positive for joint pain.    Past Medical History:  Diagnosis Date  . Asthma   . Diabetes mellitus   . GERD (gastroesophageal reflux disease)   . Headache(784.0)   . Hypertension     Social History  Substance Use Topics  . Smoking status: Former Smoker    Types: Cigarettes  . Smokeless tobacco: Never Used     Comment: quit 5 days ago  . Alcohol use No    Family History  Problem Relation Age of Onset  . Arthritis    . Diabetes    . Hypertension    . Hyperlipidemia    . Stroke    . Sudden death    . Diabetes Mother   . Diabetes Sister   . Diabetes Brother    No  Known Allergies  OBJECTIVE: Vitals:   10/13/15 2159 10/14/15 0500 10/14/15 0542 10/14/15 0824  BP: (!) 180/101 (!) 169/101 (!) 176/96 (!) 173/99  Pulse: 85  78 81  Resp: 19  19 16   Temp: 98.5 F (36.9 C)  97.5 F (36.4 C) 97.9 F (36.6 C)  TempSrc: Oral  Oral Oral  SpO2: 100%  100% 100%  Weight:      Height:       Body mass index is 32.67 kg/m.  Physical Exam  Constitutional: He is oriented to person, place, and time.  He is in good spirits.   Cardiovascular: Normal rate and regular rhythm.   No murmur heard. Pulmonary/Chest: Effort normal and breath sounds normal.  Musculoskeletal:  His left foot is wrapped. He has a new left arm PICC.  Neurological: He is alert and oriented to person, place, and time.  Skin: No rash noted.  Psychiatric: Mood and affect normal.    Lab Results Lab Results  Component Value Date   WBC 8.1 10/14/2015   HGB 12.3 (L) 10/14/2015   HCT 37.0 (L) 10/14/2015   MCV 86.4 10/14/2015   PLT 325 10/14/2015  Lab Results  Component Value Date   CREATININE 0.73 10/14/2015   BUN 9 10/14/2015   NA 137 10/14/2015   K 3.9 10/14/2015   CL 107 10/14/2015   CO2 25 10/14/2015    Lab Results  Component Value Date   ALT 15 (L) 10/14/2015   AST 11 (L) 10/14/2015   ALKPHOS 57 10/14/2015   BILITOT 0.8 10/14/2015    Sed Rate (mm/hr)  Date Value  10/10/2015 6  07/25/2015 68 (H)   CRP (mg/dL)  Date Value  10/10/2015 0.6  07/25/2015 19.7 (H)   Microbiology: Recent Results (from the past 240 hour(s))  Wound or Superficial Culture     Status: None (Preliminary result)   Collection Time: 10/10/15 10:50 PM  Result Value Ref Range Status   Specimen Description WOUND LEFT FOOT  Final   Special Requests PINKY TOE AREA  Final   Gram Stain   Final    FEW WBC PRESENT, PREDOMINANTLY PMN FEW GRAM POSITIVE COCCI IN PAIRS RARE GRAM NEGATIVE RODS    Culture   Final    FEW KLEBSIELLA ORNITHINOLYTICA STAPHYLOCOCCUS AUREUS    Report Status PENDING   Incomplete   Organism ID, Bacteria STAPHYLOCOCCUS AUREUS  Final      Susceptibility   Staphylococcus aureus - MIC*    CIPROFLOXACIN <=0.5 SENSITIVE Sensitive     ERYTHROMYCIN <=0.25 SENSITIVE Sensitive     GENTAMICIN <=0.5 SENSITIVE Sensitive     OXACILLIN 0.5 SENSITIVE Sensitive     TETRACYCLINE <=1 SENSITIVE Sensitive     VANCOMYCIN 1 SENSITIVE Sensitive     TRIMETH/SULFA <=10 SENSITIVE Sensitive     CLINDAMYCIN <=0.25 SENSITIVE Sensitive     RIFAMPIN <=0.5 SENSITIVE Sensitive     Inducible Clindamycin NEGATIVE Sensitive     * STAPHYLOCOCCUS AUREUS  Culture, blood (routine x 2)     Status: None (Preliminary result)   Collection Time: 10/10/15 11:11 PM  Result Value Ref Range Status   Specimen Description BLOOD LEFT ANTECUBITAL  Final   Special Requests BOTTLES DRAWN AEROBIC AND ANAEROBIC 5CC EA  Final   Culture NO GROWTH 2 DAYS  Final   Report Status PENDING  Incomplete  Culture, blood (routine x 2)     Status: None (Preliminary result)   Collection Time: 10/10/15 11:39 PM  Result Value Ref Range Status   Specimen Description BLOOD RIGHT WRIST  Final   Special Requests BOTTLES DRAWN AEROBIC AND ANAEROBIC 10CC EA  Final   Culture NO GROWTH 2 DAYS  Final   Report Status PENDING  Incomplete     ASSESSMENT: He is improving on therapy for polymicrobial diabetic foot infection. His recent wound swab cultures are growing MSSA and Klebsiella. Previous cultures grew methicillin-resistant coag negative staph and group B strep. Anaerobes are almost certainly present as well. I will change piperacillin tazobactam to once daily IV ertapenem and continue vancomycin for 6 full weeks through 11/21/2015.  PLAN: 1. Recommend discharge home on IV vancomycin and ertapenem through 11/21/2015 2. Recommend referral to the wound center 3. I will arrange follow-up in our clinic within the next month 4. I will sign off now but please call if we can be of further assistance while he is here  Michel Bickers, [facility] [phone] pager   [phone] cell 10/14/2015, 11:39 AM

## 2015-10-14 NOTE — Progress Notes (Addendum)
Pharmacy Antibiotic Note  Danny Fuller is a 40 y.o. male admitted on 10/10/2015 with osteomyelitis.  Pharmacy has been consulted for Vancomycin dosing.  Vancomycin trough this AM is good at 19  Plan: -Cont vancomycin 1500 mg IV q8h -Re-check VT as needed  Height: 5\' 9"  (175.3 cm) Weight: 221 lb 3.2 oz (100.3 kg) IBW/kg (Calculated) : 70.7  Temp (24hrs), Avg:98.1 F (36.7 C), Min:97.5 F (36.4 C), Max:98.5 F (36.9 C)   Recent Labs Lab 10/10/15 2030 10/11/15 0324 10/12/15 0655 10/14/15 0500  WBC 8.3 7.9 8.0 8.1  CREATININE 0.82 0.91 0.68 0.73  VANCOTROUGH  --   --   --  19    Estimated Creatinine Clearance: 146.1 mL/min (by C-G formula based on SCr of 0.8 mg/dL).    No Known Allergies   Danny Fuller 10/14/2015 6:01 AM

## 2015-10-15 DIAGNOSIS — E11621 Type 2 diabetes mellitus with foot ulcer: Secondary | ICD-10-CM

## 2015-10-15 DIAGNOSIS — E1140 Type 2 diabetes mellitus with diabetic neuropathy, unspecified: Secondary | ICD-10-CM

## 2015-10-15 DIAGNOSIS — L97529 Non-pressure chronic ulcer of other part of left foot with unspecified severity: Secondary | ICD-10-CM

## 2015-10-15 DIAGNOSIS — M86172 Other acute osteomyelitis, left ankle and foot: Secondary | ICD-10-CM

## 2015-10-15 LAB — GLUCOSE, CAPILLARY
Glucose-Capillary: 157 mg/dL — ABNORMAL HIGH (ref 65–99)
Glucose-Capillary: 156 mg/dL — ABNORMAL HIGH (ref 65–99)

## 2015-10-15 LAB — AEROBIC CULTURE  (SUPERFICIAL SPECIMEN)

## 2015-10-15 LAB — AEROBIC CULTURE W GRAM STAIN (SUPERFICIAL SPECIMEN)

## 2015-10-15 MED ORDER — VANCOMYCIN HCL 10 G IV SOLR: 1500.0000 mg | Freq: Three times a day (TID) | INTRAVENOUS | 0 refills | Status: DC

## 2015-10-15 MED ORDER — HEPARIN SOD (PORK) LOCK FLUSH 100 UNIT/ML IV SOLN
250.0000 [IU] | INTRAVENOUS | Status: AC | PRN
  Administered 2015-10-15: 250 [IU]

## 2015-10-15 MED ORDER — SENNOSIDES-DOCUSATE SODIUM 8.6-50 MG PO TABS: 1.0000 | ORAL_TABLET | Freq: Every evening | ORAL | 0 refills | Status: DC | PRN

## 2015-10-15 MED ORDER — OXYCODONE HCL 10 MG PO TABS: 10.0000 mg | ORAL_TABLET | Freq: Four times a day (QID) | ORAL | 0 refills | Status: DC | PRN

## 2015-10-15 MED ORDER — PREGABALIN 75 MG PO CAPS: 75.0000 mg | ORAL_CAPSULE | Freq: Two times a day (BID) | ORAL | 1 refills | Status: DC

## 2015-10-15 MED ORDER — LISINOPRIL 20 MG PO TABS: 40.0000 mg | ORAL_TABLET | Freq: Every day | ORAL | 0 refills | Status: DC

## 2015-10-15 MED ORDER — SODIUM CHLORIDE 0.9 % IV SOLN: 1.0000 g | INTRAVENOUS | 0 refills | Status: DC

## 2015-10-15 NOTE — Progress Notes (Signed)
Pt discharged to home with SL PICC to [facility].  Discharge instructions explained to pt.  Pt has no questions at the time of discharge.  Pt states he has all belongings.  Pt taken off unit via wheelchair by staff.

## 2015-10-15 NOTE — Care Management Note (Signed)
Case Management Note  Patient Details  Name: Danny Fuller MRN: [id number] Date of Birth: Aug 19, 1975  Subjective/Objective:                    Action/Plan:   Expected Discharge Date:  10/13/15               Expected Discharge Plan:  [place]  In-House Referral:     Discharge planning Services  CM Consult  Post Acute Care Choice:  Home Health Choice offered to:  Patient  DME Arranged:    DME Agency:     HH Arranged:  RN, IV Antibiotics, PT, OT HH Agency:  [facility]  Status of Service:  Completed, signed off  If discussed at [place] of Stay Meetings, dates discussed:    Additional Comments:  Marilu Favre, RN 10/15/2015, 1:46 PM

## 2015-10-15 NOTE — Discharge Summary (Addendum)
Physician Discharge Summary  Danny Fuller MRN: [id number] DOB/AGE: 1975-08-15 40 y.o.  PCP: Danny Morale, MD   Admit date: 10/10/2015 Discharge date: 10/15/2015  Discharge Diagnoses:    Principal Problem:   Diabetic foot infection ([place]) Active Problems:   Type 2 diabetes mellitus with diabetic neuropathy, with long-term current use of insulin ([facility])   Essential hypertension   Acute kidney injury superimposed on chronic kidney disease ([place])    Follow-up recommendations Follow-up with PCP in 3-5 days , including all  additional recommended appointments as below Follow-up CBC, CMP in 3-5 days Recommend discharge home on IV vancomycin and ertapenem through 11/21/2015 Danny Bickers, MD, follow-up with him within the next month.    Current Discharge Medication List    START taking these medications   Details  collagenase (SANTYL) ointment Apply 1 application topically daily. Wash left foot with soap and water daily dry the foot and then apply Santyl to a dry gauze dressing apply Ace wrap continue nonweightbearing on the left foot change dressing daily. Qty: 15 g, Refills: 0    ertapenem 1 g in sodium chloride 0.9 % 50 mL Inject 1 g into the vein daily. Qty: 37 ampule, Refills: 0    oxyCODONE 10 MG TABS Take 1 tablet (10 mg total) by mouth every 6 (six) hours as needed for moderate pain. Qty: 30 tablet, Refills: 0    pregabalin (LYRICA) 75 MG capsule Take 1 capsule (75 mg total) by mouth 2 (two) times daily. Qty: 60 capsule, Refills: 1    senna-docusate (SENOKOT-S) 8.6-50 MG tablet Take 1 tablet by mouth at bedtime as needed for mild constipation. Qty: 30 tablet, Refills: 0    vancomycin 1,500 mg in sodium chloride 0.9 % 500 mL Inject 1,500 mg into the vein every 8 (eight) hours. Qty: 111 ampule, Refills: 0      CONTINUE these medications which have CHANGED   Details  lisinopril (PRINIVIL,ZESTRIL) 20 MG tablet Take 2 tablets (40 mg total) by mouth daily. Qty: 120  tablet, Refills: 0      CONTINUE these medications which have NOT CHANGED   Details  gabapentin (NEURONTIN) 300 MG capsule Take 300 mg by mouth 2 (two) times daily.    insulin aspart (NOVOLOG FLEXPEN) 100 UNIT/ML FlexPen Before each meal 3 times a day, 140-199 - 2 units, 200-250 - 4 units, 251-299 - 6 units,  300-349 - 8 units,  350 or above 10 units. Insulin PEN if approved, provide syringes and needles if needed. Qty: 15 mL, Refills: 3    Insulin Glargine (LANTUS) 100 UNIT/ML Solostar Pen Inject 20 Units into the skin daily at 10 pm. Qty: 15 mL, Refills: 3    metFORMIN (GLUCOPHAGE) 1000 MG tablet Take 0.5 tablets (500 mg total) by mouth 2 (two) times daily with a meal. Qty: 180 tablet, Refills: 0    blood glucose meter kit and supplies KIT Dispense based on patient and insurance preference. Use up to four times daily as directed. (FOR ICD-9 250.00, 250.01). Qty: 1 each, Refills: 0    glucose blood (ONETOUCH VERIO) test strip Use to check blood sugar 1 time per day. Qty: 100 each, Refills: 2    Insulin Pen Needle (PEN NEEDLES) 31G X 6 MM MISC For a month supply Qty: 100 each, Refills: 0    lactobacilus acidophilus & bulgar (FLORANEX) TABS chewable tablet Take 1 tablet by mouth 3 (three) times daily with meals. Qty: 90 tablet, Refills: 0      STOP taking  these medications     oxyCODONE-acetaminophen (PERCOCET) 10-325 MG tablet            Discharge Condition: Stable  Discharge Instructions Get Medicines reviewed and adjusted: Please take all your medications with you for your next visit with your Primary MD  Please request your Primary MD to go over all hospital tests and procedure/radiological results at the follow up, please ask your Primary MD to get all Hospital records sent to his/her office.  If you experience worsening of your admission symptoms, develop shortness of breath, life threatening emergency, suicidal or homicidal thoughts you must seek medical  attention immediately by calling 911 or calling your MD immediately if symptoms less severe.  You must read complete instructions/literature along with all the possible adverse reactions/side effects for all the Medicines you take and that have been prescribed to you. Take any new Medicines after you have completely understood and accpet all the possible adverse reactions/side effects.   Do not drive when taking Pain medications.   Do not take more than prescribed Pain, Sleep and Anxiety Medications  Special Instructions: If you have smoked or chewed Tobacco in the last 2 yrs please stop smoking, stop any regular Alcohol and or any Recreational drug use.  Wear Seat belts while driving.  Please note  You were cared for by a hospitalist during your hospital stay. Once you are discharged, your primary care physician will handle any further medical issues. Please note that NO REFILLS for any discharge medications will be authorized once you are discharged, as it is imperative that you return to your primary care physician (or establish a relationship with a primary care physician if you do not have one) for your aftercare needs so that they can reassess your need for medications and monitor your lab values.  Discharge Instructions    Diet - low sodium heart healthy    Complete by:  As directed   Increase activity slowly    Complete by:  As directed   Non weight bearing    Complete by:  As directed   Laterality:  left   Extremity:  Lower       No Known Allergies    Disposition: Home with home health   Consults:  Orthopedics Infectious disease     Significant Diagnostic Studies:  Mr Foot Left W Wo Contrast  Result Date: 10/11/2015 CLINICAL DATA:  Poorly controlled diabetes. Hypertension. 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 Prior osteomyelitis. Worsening drainage, pain, and swelling along the wound. EXAM: MRI OF THE LEFT FOREFOOT WITHOUT AND WITH CONTRAST TECHNIQUE:  Multiplanar, multisequence MR imaging was performed both before and after administration of intravenous contrast. CONTRAST:  1m MULTIHANCE GADOBENATE DIMEGLUMINE 529 MG/ML IV SOLN COMPARISON:  10/10/2015 radiographs and MRI from 07/24/2015 FINDINGS: Large dorsal ulceration of the forefoot extends down to the remaining base of the fifth metatarsal and its articulation with the fourth metatarsal base as shown on images 6 through 8 of series 9. This ulceration has enhancing margins favoring granulation tissue but hypoenhancement centrally indicating necrotic tissue. There is abnormal edema and enhancement in the remaining fifth metatarsal base favoring osteomyelitis. There is also abnormal edema within the bony structures along the Lisfranc joint, with associated low-grade enhancement notable in the bases of the second, third, and fourth metatarsals ; in the middle cuneiform ; and to a lesser extent in the lateral cuneiform and cuboid. Whereas there was some low-grade edema in the bases of the second and third metatarsals and middle cuneiform  proximally, the amount of edema and enhancement appears increased today, especially along the cuboid and base of the fourth metatarsal. The Lisfranc ligament appears intact. There is mild periostitis proximally in the fourth metatarsal. There is dorsal subcutaneous edema and low-level edema and enhancement tracks along the plantar musculature of the foot. No significant phalangeal edema identified. Small degenerative lesion in the head of the first metatarsal at its articulation with the medial sesamoid, image 22/9. Small effusion of the first MTP joint The subcutaneous edema and enhancement extends into the toes. IMPRESSION: 1. Large ulceration/wound extends all the way down to the dorsal articulation between the bases of the fourth and fifth metatarsals. There is abnormal edema and enhancement in the remaining base of the fifth metatarsal characteristic of osteomyelitis. 2.  Worsening edema and enhancement along much of the rest of the Lisfranc joint, especially the base of the fourth metatarsal and proximal shaft, as well as the bases of the second and third metatarsals and the middle and lateral cuneiform second anterior cuboid. While some of this may be due to arthropathy, the increase particularly in the base of the fourth metatarsal is concerning for osteomyelitis, and early osteomyelitis at the second and third tarsometatarsal articulation is likewise difficult to exclude. 3. Subcutaneous edema and enhancement compatible with cellulitis. Potential mild myositis along the distal plantar foot musculature. Electronically Signed   By: Van Clines M.D.   On: 10/11/2015 08:18   Dg Chest Port 1 View  Result Date: 10/14/2015 CLINICAL DATA:  Line placement EXAM: PORTABLE CHEST 1 VIEW COMPARISON:  02/08/2012 FINDINGS: Right arm PICC tip in the mid right atrium. Recommend withdrawal 5 cm. Lungs remain clear without infiltrate or effusion. IMPRESSION: PICC tip in the mid right atrium, recommend withdrawal 5 cm. Electronically Signed   By: Franchot Gallo M.D.   On: 10/14/2015 11:06   Dg Foot Complete Left  Result Date: 10/10/2015 CLINICAL DATA:  Pain around the amputated region of left fifth metatarsal for 4 days. Bad smell and pus. EXAM: LEFT FOOT - COMPLETE 3+ VIEW COMPARISON:  MRI left foot 07/24/2015.  Left foot 07/23/2015 FINDINGS: Since the previous study, there has been interval amputation of the left fifth ray at the level of the proximal metatarsal. Margin of resection appears intact without evidence of significant erosion or cortical loss. Soft tissue swelling around the left forefoot at the metatarsal phalangeal region. No radiopaque soft tissue foreign bodies or gas collections. No acute fractures identified. Vascular calcifications. IMPRESSION: Soft tissue swelling. Interval amputation of the left fifth toe at the level of the proximal metatarsal. No acute bony  abnormalities. Electronically Signed   By: Lucienne Capers M.D.   On: 10/10/2015 20:52        Filed Weights   10/10/15 1940 10/11/15 0640  Weight: 100.4 kg (221 lb 5 oz) 100.3 kg (221 lb 3.2 oz)     Microbiology: Recent Results (from the past 240 hour(s))  Wound or Superficial Culture     Status: None   Collection Time: 10/10/15 10:50 PM  Result Value Ref Range Status   Specimen Description WOUND LEFT FOOT  Final   Special Requests PINKY TOE AREA  Final   Gram Stain   Final    FEW WBC PRESENT, PREDOMINANTLY PMN FEW GRAM POSITIVE COCCI IN PAIRS RARE GRAM NEGATIVE RODS    Culture   Final    FEW KLEBSIELLA ORNITHINOLYTICA FEW STAPHYLOCOCCUS AUREUS    Report Status 10/15/2015 FINAL  Final   Organism ID, Bacteria STAPHYLOCOCCUS  AUREUS  Final   Organism ID, Bacteria KLEBSIELLA ORNITHINOLYTICA  Final      Susceptibility   Klebsiella ornithinolytica - MIC*    AMPICILLIN 8 RESISTANT Resistant     CEFAZOLIN >=64 RESISTANT Resistant     CEFEPIME <=1 SENSITIVE Sensitive     CEFTAZIDIME <=1 SENSITIVE Sensitive     CEFTRIAXONE <=1 SENSITIVE Sensitive     CIPROFLOXACIN <=0.25 SENSITIVE Sensitive     GENTAMICIN <=1 SENSITIVE Sensitive     IMIPENEM 0.5 SENSITIVE Sensitive     TRIMETH/SULFA <=20 SENSITIVE Sensitive     AMPICILLIN/SULBACTAM 4 SENSITIVE Sensitive     PIP/TAZO <=4 SENSITIVE Sensitive     * FEW KLEBSIELLA ORNITHINOLYTICA   Staphylococcus aureus - MIC*    CIPROFLOXACIN <=0.5 SENSITIVE Sensitive     ERYTHROMYCIN <=0.25 SENSITIVE Sensitive     GENTAMICIN <=0.5 SENSITIVE Sensitive     OXACILLIN 0.5 SENSITIVE Sensitive     TETRACYCLINE <=1 SENSITIVE Sensitive     VANCOMYCIN 1 SENSITIVE Sensitive     TRIMETH/SULFA <=10 SENSITIVE Sensitive     CLINDAMYCIN <=0.25 SENSITIVE Sensitive     RIFAMPIN <=0.5 SENSITIVE Sensitive     Inducible Clindamycin NEGATIVE Sensitive     * FEW STAPHYLOCOCCUS AUREUS  Culture, blood (routine x 2)     Status: None (Preliminary result)    Collection Time: 10/10/15 11:11 PM  Result Value Ref Range Status   Specimen Description BLOOD LEFT ANTECUBITAL  Final   Special Requests BOTTLES DRAWN AEROBIC AND ANAEROBIC 5CC EA  Final   Culture NO GROWTH 3 DAYS  Final   Report Status PENDING  Incomplete  Culture, blood (routine x 2)     Status: None (Preliminary result)   Collection Time: 10/10/15 11:39 PM  Result Value Ref Range Status   Specimen Description BLOOD RIGHT WRIST  Final   Special Requests BOTTLES DRAWN AEROBIC AND ANAEROBIC 10CC EA  Final   Culture NO GROWTH 3 DAYS  Final   Report Status PENDING  Incomplete       Blood Culture    Component Value Date/Time   SDES BLOOD RIGHT WRIST 10/10/2015 2339   SPECREQUEST BOTTLES DRAWN AEROBIC AND ANAEROBIC 10CC EA 10/10/2015 2339   CULT NO GROWTH 3 DAYS 10/10/2015 2339   REPTSTATUS PENDING 10/10/2015 2339      Labs: Results for orders placed or performed during the hospital encounter of 10/10/15 (from the past 48 hour(s))  Glucose, capillary     Status: Abnormal   Collection Time: 10/13/15 11:37 AM  Result Value Ref Range   Glucose-Capillary 184 (H) 65 - 99 mg/dL  Glucose, capillary     Status: Abnormal   Collection Time: 10/13/15  5:07 PM  Result Value Ref Range   Glucose-Capillary 152 (H) 65 - 99 mg/dL  Glucose, capillary     Status: Abnormal   Collection Time: 10/13/15  9:49 PM  Result Value Ref Range   Glucose-Capillary 179 (H) 65 - 99 mg/dL  Comprehensive metabolic panel     Status: Abnormal   Collection Time: 10/14/15  5:00 AM  Result Value Ref Range   Sodium 137 135 - 145 mmol/L   Potassium 3.9 3.5 - 5.1 mmol/L   Chloride 107 101 - 111 mmol/L   CO2 25 22 - 32 mmol/L   Glucose, Bld 118 (H) 65 - 99 mg/dL   BUN 9 6 - 20 mg/dL   Creatinine, Ser 0.73 0.61 - 1.24 mg/dL   Calcium 9.0 8.9 - 10.3 mg/dL   Total  Protein 6.0 (L) 6.5 - 8.1 g/dL   Albumin 3.2 (L) 3.5 - 5.0 g/dL   AST 11 (L) 15 - 41 U/L   ALT 15 (L) 17 - 63 U/L   Alkaline Phosphatase 57 38 - 126  U/L   Total Bilirubin 0.8 0.3 - 1.2 mg/dL   GFR calc non Af Amer >60 >60 mL/min   GFR calc Af Amer >60 >60 mL/min    Comment: (NOTE) The eGFR has been calculated using the CKD EPI equation. This calculation has not been validated in all clinical situations. eGFR's persistently <60 mL/min signify possible Chronic Kidney Disease.    Anion gap 5 5 - 15  CBC     Status: Abnormal   Collection Time: 10/14/15  5:00 AM  Result Value Ref Range   WBC 8.1 4.0 - 10.5 K/uL   RBC 4.28 4.22 - 5.81 MIL/uL   Hemoglobin 12.3 (L) 13.0 - 17.0 g/dL   HCT 37.0 (L) 39.0 - 52.0 %   MCV 86.4 78.0 - 100.0 fL   MCH 28.7 26.0 - 34.0 pg   MCHC 33.2 30.0 - 36.0 g/dL   RDW 13.2 11.5 - 15.5 %   Platelets 325 150 - 400 K/uL  Vancomycin, trough     Status: None   Collection Time: 10/14/15  5:00 AM  Result Value Ref Range   Vancomycin Tr 19 15 - 20 ug/mL  Glucose, capillary     Status: Abnormal   Collection Time: 10/14/15  7:58 AM  Result Value Ref Range   Glucose-Capillary 113 (H) 65 - 99 mg/dL  Glucose, capillary     Status: Abnormal   Collection Time: 10/14/15 12:00 PM  Result Value Ref Range   Glucose-Capillary 135 (H) 65 - 99 mg/dL  Glucose, capillary     Status: Abnormal   Collection Time: 10/14/15  4:41 PM  Result Value Ref Range   Glucose-Capillary 172 (H) 65 - 99 mg/dL  Glucose, capillary     Status: Abnormal   Collection Time: 10/14/15  9:22 PM  Result Value Ref Range   Glucose-Capillary 171 (H) 65 - 99 mg/dL  Glucose, capillary     Status: Abnormal   Collection Time: 10/15/15  7:48 AM  Result Value Ref Range   Glucose-Capillary 157 (H) 65 - 99 mg/dL   Comment 1 Notify RN      Lipid Panel     Component Value Date/Time   CHOL 147 04/23/2013 1332   TRIG 89.0 04/23/2013 1332   HDL 36.70 (L) 04/23/2013 1332   CHOLHDL 4 04/23/2013 1332   VLDL 17.8 04/23/2013 1332   LDLCALC 93 04/23/2013 1332     Lab Results  Component Value Date   HGBA1C 13.7 (H) 07/25/2015   HGBA1C 13.6 (H)  07/24/2015   HGBA1C 13.5 (H) 07/23/2015        HPI :  Danny Fuller is a 40 y.o. male diabetic who developed osteomyelitis of his left foot. He underwent left fifth ray amputation and drainage of a large abscess over the dorsum of his foot on 07/26/2015. Operative cultures grew group B strep and methicillin-resistant coagulase-negative staph. He was discharged on 07/29/2015 with a plan for doxycycline and amoxicillin clavulanate for one month. He was left with a large ulcerated area over the dorsum of the foot but did well initially. However, about one month ago he began to notice increased drainage from the ulcer. The drainage has developed a very foul odor. The wound itself started to look worse  with more areas of yellow, shaggy discoloration. He was seen by Dr. Sharol Given about 2 weeks ago and started on oral doxycycline. He has continued to worsen to admission 2 days ago. A swab of the wound shows gram-positive cocci in pairs and gram-negative rods on Gram stain. Culture is growing gram-negative rods. MRI reveals some new bony enhancement along the Lisfranc joint, especially at the base of the fourth metatarsal  HOSPITAL COURSE:    1. Wound infection: New pain, swelling, foul-smelling drainage from surgical wound. Patient seen by Dr. Sharol Given,  as well as Dr. Megan Salon  Wound culture from 8/27 shows [facility] screen negative and no personal history of resistant infections.    ABIs WNL during last hospitalization. Blood sugars well controlled since last hospitalization. Has stopped smoking. -Follow up MRI -Confirms osteomyelitis/myositis/  abscess, Dr. Sharol Given recommended antibiotics, no surgical intervention Initially placed on vancomycin and Zosyn, infectious disease, Dr. Megan Salon switched antibiotics to IV vancomycin and ertapenem through 11/21/2015 Status post PICC line placement   patient would need to follow-up with infectious disease in  orthopedics within the next month to monitor progress Patient is Being discharged with home health   2. IDDM:Accu-Chek improved Continue Lantus  at home dose Resume at [facility]  3. HTN: -Continue lisinopril 40 mg daily (changed by endocrine recently)  4. Former smoking: -Nicotine gum, PRN  5. Diabetic neuropathy-started patient on Lyrica, gabapentin not working , PCP to make a decision about stopping gabapentin     Discharge Exam:   Blood pressure (!) 164/108, pulse 83, temperature 98.4 F (36.9 C), temperature source Oral, resp. rate 18, height 5' 9"  (1.753 m), weight 100.3 kg (221 lb 3.2 oz), SpO2 98 %.   Constitutional: He is oriented to person, place, and time.  He is resting quietly in bed eating lunch.  Cardiovascular: Normal rate and regular rhythm.   No murmur heard. Pulmonary/Chest: Effort normal and breath sounds normal.  Musculoskeletal:  See attached picture.  Neurological: He is alert and oriented to person, place, and time.  Skin: No rash noted.  Psychiatric: Mood and affect normal.    Follow-up Information    [name], MD Follow up in 1 week(s).   Specialty:  Orthopedic Surgery Contact information: [place] [address] [phone]        Danny Bickers, MD Follow up in 2 week(s).   Specialty:  Infectious Diseases Why:  Call to make this appointment as soon as possible Contact information: [address] [place] [address] [phone]        Danny Morale, MD. Schedule an appointment as soon as possible for a visit in 2 day(s).   Specialty:  Family Medicine Why:  Call to make this appointment as soon as possible to assist for hospital follow-up Contact information: [place] [address] [phone]           Signed: [name] 10/15/2015, 11:04 AM        Time spent >45 mins

## 2015-10-16 LAB — CULTURE, BLOOD (ROUTINE X 2)
Culture: NO GROWTH
Culture: NO GROWTH

## 2015-10-18 ENCOUNTER — Emergency Department (HOSPITAL_COMMUNITY)
Admission: EM | Admit: 2015-10-18 | Discharge: 2015-10-18 | Disposition: A | Discharge status: Home / Self Care | Payer: BC Managed Care – PPO | Attending: Emergency Medicine | Admitting: Emergency Medicine

## 2015-10-18 ENCOUNTER — Encounter (HOSPITAL_COMMUNITY): Payer: Self-pay

## 2015-10-18 DIAGNOSIS — I10 Essential (primary) hypertension: Secondary | ICD-10-CM | POA: Insufficient documentation

## 2015-10-18 DIAGNOSIS — J45909 Unspecified asthma, uncomplicated: Secondary | ICD-10-CM | POA: Insufficient documentation

## 2015-10-18 DIAGNOSIS — Z794 Long term (current) use of insulin: Secondary | ICD-10-CM | POA: Diagnosis not present

## 2015-10-18 DIAGNOSIS — Z87891 Personal history of nicotine dependence: Secondary | ICD-10-CM | POA: Insufficient documentation

## 2015-10-18 DIAGNOSIS — E1140 Type 2 diabetes mellitus with diabetic neuropathy, unspecified: Secondary | ICD-10-CM | POA: Insufficient documentation

## 2015-10-18 LAB — URINALYSIS, ROUTINE W REFLEX MICROSCOPIC
Bilirubin Urine: NEGATIVE
GLUCOSE, UA: NEGATIVE mg/dL
HGB URINE DIPSTICK: NEGATIVE
Ketones, ur: NEGATIVE mg/dL
Leukocytes, UA: NEGATIVE
Nitrite: NEGATIVE
PROTEIN: NEGATIVE mg/dL
Specific Gravity, Urine: 1.021 (ref 1.005–1.030)
pH: 6.5 (ref 5.0–8.0)

## 2015-10-18 LAB — I-STAT CHEM 8, ED
BUN: 14 mg/dL (ref 6–20)
CREATININE: 0.8 mg/dL (ref 0.61–1.24)
Calcium, Ion: 1.22 mmol/L (ref 1.15–1.40)
Chloride: 101 mmol/L (ref 101–111)
GLUCOSE: 182 mg/dL — AB (ref 65–99)
HCT: 43 % (ref 39.0–52.0)
HEMOGLOBIN: 14.6 g/dL (ref 13.0–17.0)
POTASSIUM: 4.1 mmol/L (ref 3.5–5.1)
Sodium: 142 mmol/L (ref 135–145)
TCO2: 27 mmol/L (ref 0–100)

## 2015-10-18 LAB — CBG MONITORING, ED: Glucose-Capillary: 179 mg/dL — ABNORMAL HIGH (ref 65–99)

## 2015-10-18 MED ORDER — ACETAMINOPHEN 500 MG PO TABS
1000.0000 mg | ORAL_TABLET | Freq: Once | ORAL | Status: AC
  Administered 2015-10-18: 1000 mg via ORAL
  Filled 2015-10-18: qty 2

## 2015-10-18 NOTE — Discharge Instructions (Signed)

## 2015-10-18 NOTE — ED Provider Notes (Signed)
Emergency Department Provider Note   I have reviewed the triage vital signs and the nursing notes.   HISTORY  Chief Complaint Hypertension   HPI Danny Fuller is a 40 y.o. male with PMH of HTN, GERD, and DM presents to the emergency department for evaluation of asymptomatic hypertension. The patient was recently discharged from the hospital with left foot ulcer and was discharged home with a PICC line antibiotics. He has had multiple visits today from home health nurses. At 1 PM he had an appointment and was told that his blood pressure was "ok." A second nurse came for different appointment at 2:30 and she recorded very high blood pressures with systolic numbers in the [id number] range. Patient denies any symptoms at the time of that blood pressure check. He has had a mild headache but nothing that would've independently brought him for evaluation. The nurse referred him to the emergency department given his blood pressure. He has been compliant with his lisinopril which was increased during his recent hospitalization for elevated blood pressure. He denies any vision changes, weakness, chest pain, difficulty breathing.   Past Medical History:  Diagnosis Date  . Asthma   . Diabetes mellitus   . GERD (gastroesophageal reflux disease)   . Headache(784.0)   . Hypertension     Patient Active Problem List   Diagnosis Date Noted  . Diabetic foot infection ([place]) 10/11/2015  . Acute kidney injury superimposed on chronic kidney disease ([place]) 10/11/2015  . Asthma 07/23/2015  . Chronic headache 07/23/2015  . Erectile disorder due to medical condition in male 04/23/2015  . BMI 33.0-33.9,adult 04/21/2012  . GERD 04/21/2008  . Type 2 diabetes mellitus with diabetic neuropathy, with long-term current use of insulin ([place]) 11/21/2006  . Essential hypertension 11/21/2006  . LUMBAR STRAIN 11/21/2006    Past Surgical History:  Procedure Laterality Date  . AMPUTATION Left 07/26/2015   Procedure:  AMPUTATION LEFT FIFTH RAY;  Surgeon: [name], MD;  Location: [facility];  Service: Orthopedics;  Laterality: Left;  . bilateral hip pins placed      Current Outpatient Rx  . Order #: [id number] Class: Print  . Order #: [id number] Class: Print  . Order #: [id number] Class: Historical Med  . Order #: [id number] Class: Print  . Order #: [id number] Class: Normal  . Order #: [id number] Class: Normal  . Order #: [id number] Class: No Print  . Order #: [id number] Class: Print  . Order #: [id number] Class: Print  . Order #: [id number] Class: Normal  . Order #: [id number] Class: Print  . Order #: [id number] Class: Print  . Order #: [id number] Class: Normal  . Order #: [id number] Class: Print  . Order #: [id number] Class: Normal    Allergies Review of patient's allergies indicates no known allergies.  Family History  Problem Relation Age of Onset  . Arthritis    . Diabetes    . Hypertension    . Hyperlipidemia    . Stroke    . Sudden death    . Diabetes Mother   . Diabetes Sister   . Diabetes Brother     Social History Social History  Substance Use Topics  . Smoking status: Former Smoker    Types: Cigarettes  . Smokeless tobacco: Never Used     Comment: quit 5 days ago  . Alcohol use No    Review of Systems  Constitutional: No fever/chills Eyes: No visual changes. ENT: No sore throat. Cardiovascular: Denies chest pain. Respiratory: Denies shortness of breath. Gastrointestinal: No abdominal pain.  No nausea, no vomiting.  No diarrhea.  No constipation. Genitourinary: Negative for dysuria. Musculoskeletal: Negative for back pain. Skin: Negative for rash. Neurological: Negative for focal weakness or numbness. Positive mild HA  10-point ROS otherwise negative.  ____________________________________________   PHYSICAL EXAM:  VITAL SIGNS: ED Triage Vitals  Enc Vitals Group     BP 10/18/15 1708 (!) 180/106     Pulse Rate 10/18/15 1708 96     Resp 10/18/15 1708 16     Temp 10/18/15 1708 98.5  F (36.9 C)     Temp Source 10/18/15 1708 Oral     SpO2 10/18/15 1708 98 %     Weight 10/18/15 1709 221 lb (100.2 kg)     Height 10/18/15 1709 5\' 9"  (1.753 m)     Pain Score 10/18/15 1709 7    Constitutional: Alert and oriented. Well appearing and in no acute distress. Eyes: Conjunctivae are normal. Head: Atraumatic. Nose: No congestion/rhinnorhea. Mouth/Throat: Mucous membranes are moist.  Oropharynx non-erythematous. Neck: No stridor.   Cardiovascular: Normal rate, regular rhythm. Good peripheral circulation. Grossly normal heart sounds.   Respiratory: Normal respiratory effort.  No retractions. Lungs CTAB. Gastrointestinal: Soft and nontender. No distention.  Musculoskeletal: No lower extremity tenderness nor edema. No gross deformities of extremities. Neurologic:  Normal speech and language. No gross focal neurologic deficits are appreciated.  Skin:  Skin is warm, dry and intact. No rash noted.   ____________________________________________   LABS (all labs ordered are listed, but only abnormal results are displayed)  Labs Reviewed  CBG MONITORING, ED - Abnormal; Notable for the following:       Result Value   Glucose-Capillary 179 (*)    All other components within normal limits  I-STAT CHEM 8, ED - Abnormal; Notable for the following:    Glucose, Bld 182 (*)    All other components within normal limits  URINALYSIS, ROUTINE W REFLEX MICROSCOPIC (NOT AT [facility])   ____________________________________________  EKG   EKG Interpretation  Date/Time:  Monday October 18 2015 19:26:42 EDT Ventricular Rate:  81 PR Interval:    QRS Duration: 95 QT Interval:  355 QTC Calculation: 412 R Axis:   103 Text Interpretation:  Sinus rhythm Right axis deviation ST elev, probable normal early repol pattern No STEMI.  Similar to prior tracing.  Confirmed by LONG MD, JOSHUA [phone]) on 10/18/2015 7:31:11 PM        ____________________________________________  RADIOLOGY  None ____________________________________________   PROCEDURES  Procedure(s) performed:   Procedures  None ____________________________________________   INITIAL IMPRESSION / ASSESSMENT AND PLAN / ED COURSE  Pertinent labs & imaging results that were available during my care of the patient were reviewed by me and considered in my medical decision making (see chart for details).  Patient resents the emergency department for evaluation of asymptomatic hypertension. Blood pressure on arrival here is elevated but without symptoms I will not acutely lower the blood pressure. An for baseline labs to assess for evidence of end organ damage although my suspicion for hypertensive emergency is low. Patient does have a mild headache with no focal neurological deficits. Plan for Tylenol to be given and reassess. EKG is similar to prior tracing with no evidence of acute ischemia.  08:17 PM Labs reviewed with no evidence of end organ damage. Patient blood pressure remains elevated but no indication for acute lowering of blood pressure. Discussed return precautions in detail with the patient. He will call his primary care physician in the morning to discuss outpatient blood pressure management.  At  this time, I do not feel there is any life-threatening condition present. I have reviewed and discussed all results (EKG, imaging, lab, urine as appropriate), exam findings with patient. I have reviewed nursing notes and appropriate previous records.  I feel the patient is safe to be discharged home without further emergent workup. Discussed usual and customary return precautions. Patient and family (if present) verbalize understanding and are comfortable with this plan.  Patient will follow-up with their primary care provider. If they do not have a primary care provider, information for follow-up has been provided to them. All questions have been  answered.  ____________________________________________  FINAL CLINICAL IMPRESSION(S) / ED DIAGNOSES  Final diagnoses:  Essential hypertension     MEDICATIONS GIVEN DURING THIS VISIT:  Medications  acetaminophen (TYLENOL) tablet 1,000 mg (1,000 mg Oral Given 10/18/15 1959)     NEW OUTPATIENT MEDICATIONS STARTED DURING THIS VISIT:  None   Note:  This document was prepared using Dragon voice recognition software and may include unintentional dictation errors.  Nanda Quinton, MD Emergency Medicine   Margette Fast, MD 10/19/15 [phone]

## 2015-10-18 NOTE — ED Notes (Signed)
Dr. Long at bedside at this time.  

## 2015-10-18 NOTE — ED Triage Notes (Signed)
Pt reports HTN at home today, home health nurse checked BP at 1415 and found it to be 220/130. Pt reports BP was 123456 systolic this morning. Pt reports taking a nap and having slight headache. No neurological symptoms.

## 2015-10-19 ENCOUNTER — Encounter: Payer: Self-pay | Admitting: Family Medicine

## 2015-10-19 ENCOUNTER — Telehealth: Payer: Self-pay | Admitting: *Deleted

## 2015-10-19 NOTE — Telephone Encounter (Signed)
Patient checked into ED 10/18/15.  ------------------------------------------------------- PLEASE NOTE: All timestamps contained within this report are represented as [place] Standard Time. CONFIDENTIALTY NOTICE: This fax transmission is intended only for the addressee. It contains information that is legally privileged, confidential or otherwise protected from use or disclosure. If you are not the intended recipient, you are strictly prohibited from reviewing, disclosing, copying using or disseminating any of this information or taking any action in reliance on or regarding this information. If you have received this fax in error, please notify us immediately by telephone so that we can arrange for its return to [name]. Phone: [phone], Toll-Free: [phone], Fax: [phone] Page: 1 of 2 Call Id: [id number] [facility] Night - Client [place] Patient Name: Danny Fuller Gender: Male DOB: [birth year] Age: 40 Y 3 M 4 D Return Phone Number: [phone] (Primary) Address: City/State/Zip: [place] Client [facility] Night - Client Client Site [facility] - Night Physician [name] - MD Contact Type Call Who Is Calling Patient / Member / Family / Caregiver Call Type Triage / Clinical Caller Name Tilford Pillar [phone] Relationship To Patient Care Giver Return Phone Number Please choose phone number Chief Complaint BLOOD PRESSURE HIGH - Diastolic (bottom number) [phone] or [phone] (with symptoms) Reason for Call Symptomatic / Request for Health Information Initial Comment Caller says, Tillie Rung w/ [facility], physical therapy, BP is 230/140 , HR 79 But all other vitals are normal. PreDisposition InappropriateToAsk Translation No Nurse Assessment Nurse: Union Desanctis, RN, Amy Date/Time (Eastern Time): 10/18/2015 2:55:34 PM Confirm and document reason for call. If  symptomatic, describe symptoms. You must click the next button to save text entered. ---Caller is Tillie Rung, physical therapy for [name]. She reports a blood pressure of 230/140. In the leg the blood pressure is 180/120. Reports no symptoms. Has the patient traveled out of the country within the last 30 days? ---Not Applicable Does the patient have any new or worsening symptoms? ---Yes Will a triage be completed? ---Yes Related visit to physician within the last 2 weeks? ---No Does the PT have any chronic conditions? (i.e. diabetes, asthma, etc.) ---Yes List chronic conditions. ---htn , diabetic, wound on foot, recently in hospital for wound infection in foot and now receiving home health care. Is this a behavioral health or substance abuse call? ---No Guidelines Guideline Title Affirmed Question Affirmed Notes Nurse Date/Time (Eastern Time) High Blood Pressure [1] BP # 160 / 100 AND [2] cardiac or neurologic symptoms (e.g., chest pain, difficulty breathing, [place], RN, Amy 10/18/2015 2:59:00 PM PLEASE NOTE: All timestamps contained within this report are represented as [place] Standard Time. CONFIDENTIALTY NOTICE: This fax transmission is intended only for the addressee. It contains information that is legally privileged, confidential or otherwise protected from use or disclosure. If you are not the intended recipient, you are strictly prohibited from reviewing, disclosing, copying using or disseminating any of this information or taking any action in reliance on or regarding this information. If you have received this fax in error, please notify us immediately by telephone so that we can arrange for its return to [name]. Phone: [phone], Toll-Free: [phone], Fax: [phone] Page: 2 of 2 Call Id: [id number] Guidelines Guideline Title Affirmed Question Affirmed Notes Nurse Date/Time Eilene Ghazi Time) unsteady gait, blurred vision) Disp. Time Eilene Ghazi Time) Disposition Final  User 10/18/2015 2:51:58 PM Send to Urgent Queue Eather Colas 10/18/2015 3:01:46 PM Go to ED Now Yes [place] Desanctis, RN, Amy Caller Understands: Yes Disagree/Comply: Comply  Care Advice Given Per Guideline GO TO ED NOW: You need to be seen in the Emergency Department. Go to the ER at ___________ [place] now. Drive carefully. NOTE TO TRIAGER - DRIVING: * Another adult should drive. * If immediate transportation is not available via car or taxi, then the patient should be instructed to call [id number]. CALL EMS 911 IF: * Patient passes out, starts acting confused or becomes too weak to stand. * You become worse. CARE ADVICE given per High Blood Pressure (Adult) guideline. Comments User: Laurence Ferrari, RN Date/Time Eilene Ghazi Time): 10/18/2015 3:02:59 PM States he has been having vision issues for a couple of months now. His gait is different now but care giver says he thinks it is because of his foot wound. Referrals [facility] - ED

## 2015-10-20 ENCOUNTER — Telehealth: Payer: Self-pay | Admitting: *Deleted

## 2015-10-20 ENCOUNTER — Telehealth: Payer: Self-pay | Admitting: Family Medicine

## 2015-10-20 NOTE — Telephone Encounter (Signed)
[facility] is requesting verbal orders for skill nursing and IV infusion assistance.

## 2015-10-20 NOTE — Telephone Encounter (Signed)
Tillie Rung with [facility] called requesting a Knee Scooter for pt. Please fax order to [phone].

## 2015-10-20 NOTE — Telephone Encounter (Signed)
I have spoken with pt and went over the below information.

## 2015-10-20 NOTE — Telephone Encounter (Signed)
I have not been involved with this process at all. They should contact Dr. Sharol Given (orthopedics) about the scooter and Dr. Megan Salon (ID) about the [facility] line antibiotics

## 2015-10-20 NOTE — Telephone Encounter (Signed)
See my other note to contact Dr. Sharol Given

## 2015-10-20 NOTE — Telephone Encounter (Signed)
Pt would like to schedule a follow up visit with Dr. Sarajane Jews, can you please call pt?

## 2015-10-20 NOTE — Telephone Encounter (Signed)
I have spoken with pt and sent over below information.

## 2015-10-20 NOTE — Telephone Encounter (Signed)
Pt scheduled  

## 2015-10-21 ENCOUNTER — Telehealth: Payer: Self-pay | Admitting: Family Medicine

## 2015-10-21 NOTE — Telephone Encounter (Signed)
° °  Denita from [facility] call to ask for orders.  wound left fifth toe clean with would cleanser apply santyl a saline moisture gauze cover with dry dressing      [phone]

## 2015-10-22 ENCOUNTER — Ambulatory Visit (INDEPENDENT_AMBULATORY_CARE_PROVIDER_SITE_OTHER): Payer: BC Managed Care – PPO | Admitting: Family Medicine

## 2015-10-22 ENCOUNTER — Encounter: Payer: Self-pay | Admitting: Family Medicine

## 2015-10-22 VITALS — BP 166/99 | HR 80 | Temp 98.2°F | Ht 69.0 in | Wt 229.0 lb

## 2015-10-22 DIAGNOSIS — E1169 Type 2 diabetes mellitus with other specified complication: Secondary | ICD-10-CM | POA: Diagnosis not present

## 2015-10-22 DIAGNOSIS — L089 Local infection of the skin and subcutaneous tissue, unspecified: Secondary | ICD-10-CM

## 2015-10-22 DIAGNOSIS — K219 Gastro-esophageal reflux disease without esophagitis: Secondary | ICD-10-CM | POA: Diagnosis not present

## 2015-10-22 DIAGNOSIS — E1140 Type 2 diabetes mellitus with diabetic neuropathy, unspecified: Secondary | ICD-10-CM

## 2015-10-22 DIAGNOSIS — I10 Essential (primary) hypertension: Secondary | ICD-10-CM

## 2015-10-22 DIAGNOSIS — E11628 Type 2 diabetes mellitus with other skin complications: Secondary | ICD-10-CM

## 2015-10-22 DIAGNOSIS — Z794 Long term (current) use of insulin: Secondary | ICD-10-CM

## 2015-10-22 MED ORDER — FUROSEMIDE 20 MG PO TABS: 20.0000 mg | ORAL_TABLET | Freq: Every day | ORAL | 5 refills | Status: DC

## 2015-10-22 MED ORDER — AMLODIPINE BESYLATE 5 MG PO TABS: 5.0000 mg | ORAL_TABLET | Freq: Every day | ORAL | 3 refills | Status: DC

## 2015-10-22 NOTE — Progress Notes (Signed)
Pre visit review using our clinic review tool, if applicable. No additional management support is needed unless otherwise documented below in the visit note. 

## 2015-10-22 NOTE — Telephone Encounter (Signed)
I spoke with Denita and gave verbal order for dressing change, also she requested a consult for social worker. Per Dr. Sarajane Jews okay for this also and gave verbal to [place].

## 2015-10-22 NOTE — Progress Notes (Signed)
   Subjective:    Patient ID: Danny Fuller, male    DOB: Dec 04, 1975, 40 y.o.   MRN: [id number]  HPI Here to follow up on a hospital stay from 10-10-15 to 10-15-15 for a diabetic foot infection. He was seen in consultation by Dr. Sharol Given (orthopedics) and by Dr. Megan Salon (ID), and he was sent home on IV Vancomycin and Ertapenem. He then went to the ER on 10-18-15 for accelerated HTN. His BP on arrival was 180/106 and his only symptoms was a mild headache. He had multiple lab tests and an EKG that were unremarkable. He was sent home with no medication changes and told to see [name]. Since then the headache has resolved but his BP remains high and he has had swelling in both lower legs. No chest pain or SOB.    Review of Systems  Constitutional: Negative.   Eyes: Negative.   Respiratory: Negative.   Cardiovascular: Positive for leg swelling. Negative for chest pain and palpitations.  Gastrointestinal: Negative.   Skin: Positive for wound.  Neurological: Negative.        Objective:   Physical Exam  Constitutional: He appears well-developed and well-nourished.  Walking on crutches  Neck: No thyromegaly present.  Cardiovascular: Normal rate, regular rhythm, normal heart sounds and intact distal pulses.   Pulmonary/Chest: Effort normal and breath sounds normal.  Musculoskeletal:  2+ edema in both lower legs  Lymphadenopathy:    He has no cervical adenopathy.  Skin:  His bandage on the left foot is in place           Assessment & Plan:  He is recovering from a cellulitis on the left foot and he is getting IV antibiotics through a PICC line. These are managed by Dr. Megan Salon, and Rod is scheduled to follow up with him on 11-11-15. His diabetes is treated by Dr. Dwyane Dee. His BP has been running high despite taking a total of 40 mg a day of Lisinopril. We will add Amlodipine 5 mg daily to this. Add Lasix 20 mg daily for the leg edema. Recheck in 2 weeks.  [name], MD

## 2015-10-22 NOTE — Telephone Encounter (Signed)
Ask them to change this dressing daily

## 2015-10-26 ENCOUNTER — Telehealth: Payer: Self-pay | Admitting: Family Medicine

## 2015-10-26 ENCOUNTER — Encounter: Payer: Self-pay | Admitting: Family Medicine

## 2015-10-26 NOTE — Telephone Encounter (Signed)
He needs to take his medications as directed and then check the BP. Let us know if it does not come down

## 2015-10-26 NOTE — Telephone Encounter (Signed)
I called Donita and informed her of the information below and she agreed.

## 2015-10-26 NOTE — Telephone Encounter (Signed)
Pt bp 150/100 and recheck at end of visit today 158/98. Pt has not taken  bp med this morning. Please advise

## 2015-10-29 ENCOUNTER — Telehealth: Payer: Self-pay | Admitting: *Deleted

## 2015-10-29 NOTE — Telephone Encounter (Signed)
Tillie Rung RN with [facility] called to advise that the patient BP spikes after taking his ertapenem. He has gone to the ED as it goes into 200/100 range within 30 minutes of taking the medication. She just wanted Dr Megan Salon to know.  According to Carolynn Sayers, she has advised the patient to contact his PCP Dr Sarajane Jews until his follow up here 11/11/15. Thanked her for the call and advised will let the doctor know.

## 2015-11-01 ENCOUNTER — Telehealth: Payer: Self-pay | Admitting: Pharmacist

## 2015-11-01 ENCOUNTER — Encounter: Payer: Self-pay | Admitting: Family Medicine

## 2015-11-01 ENCOUNTER — Encounter (HOSPITAL_COMMUNITY): Payer: Self-pay | Admitting: Emergency Medicine

## 2015-11-01 DIAGNOSIS — B961 Klebsiella pneumoniae [K. pneumoniae] as the cause of diseases classified elsewhere: Secondary | ICD-10-CM | POA: Diagnosis present

## 2015-11-01 DIAGNOSIS — Z79899 Other long term (current) drug therapy: Secondary | ICD-10-CM

## 2015-11-01 DIAGNOSIS — E11621 Type 2 diabetes mellitus with foot ulcer: Secondary | ICD-10-CM | POA: Diagnosis present

## 2015-11-01 DIAGNOSIS — N179 Acute kidney failure, unspecified: Principal | ICD-10-CM | POA: Diagnosis present

## 2015-11-01 DIAGNOSIS — E11628 Type 2 diabetes mellitus with other skin complications: Secondary | ICD-10-CM | POA: Diagnosis present

## 2015-11-01 DIAGNOSIS — K219 Gastro-esophageal reflux disease without esophagitis: Secondary | ICD-10-CM | POA: Diagnosis present

## 2015-11-01 DIAGNOSIS — B9561 Methicillin susceptible Staphylococcus aureus infection as the cause of diseases classified elsewhere: Secondary | ICD-10-CM | POA: Diagnosis present

## 2015-11-01 DIAGNOSIS — D649 Anemia, unspecified: Secondary | ICD-10-CM | POA: Diagnosis present

## 2015-11-01 DIAGNOSIS — L089 Local infection of the skin and subcutaneous tissue, unspecified: Secondary | ICD-10-CM | POA: Diagnosis present

## 2015-11-01 DIAGNOSIS — Z89422 Acquired absence of other left toe(s): Secondary | ICD-10-CM

## 2015-11-01 DIAGNOSIS — Z87891 Personal history of nicotine dependence: Secondary | ICD-10-CM

## 2015-11-01 DIAGNOSIS — Z823 Family history of stroke: Secondary | ICD-10-CM

## 2015-11-01 DIAGNOSIS — T368X5A Adverse effect of other systemic antibiotics, initial encounter: Secondary | ICD-10-CM | POA: Diagnosis present

## 2015-11-01 DIAGNOSIS — E1169 Type 2 diabetes mellitus with other specified complication: Secondary | ICD-10-CM | POA: Diagnosis present

## 2015-11-01 DIAGNOSIS — E1140 Type 2 diabetes mellitus with diabetic neuropathy, unspecified: Secondary | ICD-10-CM | POA: Diagnosis present

## 2015-11-01 DIAGNOSIS — I10 Essential (primary) hypertension: Secondary | ICD-10-CM | POA: Diagnosis present

## 2015-11-01 DIAGNOSIS — E669 Obesity, unspecified: Secondary | ICD-10-CM | POA: Diagnosis present

## 2015-11-01 DIAGNOSIS — J45909 Unspecified asthma, uncomplicated: Secondary | ICD-10-CM | POA: Diagnosis present

## 2015-11-01 DIAGNOSIS — Z833 Family history of diabetes mellitus: Secondary | ICD-10-CM

## 2015-11-01 DIAGNOSIS — Z794 Long term (current) use of insulin: Secondary | ICD-10-CM

## 2015-11-01 DIAGNOSIS — L97509 Non-pressure chronic ulcer of other part of unspecified foot with unspecified severity: Secondary | ICD-10-CM | POA: Diagnosis present

## 2015-11-01 DIAGNOSIS — M869 Osteomyelitis, unspecified: Secondary | ICD-10-CM | POA: Diagnosis present

## 2015-11-01 DIAGNOSIS — Z8249 Family history of ischemic heart disease and other diseases of the circulatory system: Secondary | ICD-10-CM

## 2015-11-01 DIAGNOSIS — Z6833 Body mass index (BMI) 33.0-33.9, adult: Secondary | ICD-10-CM

## 2015-11-01 LAB — COMPREHENSIVE METABOLIC PANEL
ALBUMIN: 3.9 g/dL (ref 3.5–5.0)
ALT: 18 U/L (ref 17–63)
AST: 28 U/L (ref 15–41)
Alkaline Phosphatase: 69 U/L (ref 38–126)
Anion gap: 9 (ref 5–15)
BUN: 35 mg/dL — AB (ref 6–20)
CHLORIDE: 105 mmol/L (ref 101–111)
CO2: 22 mmol/L (ref 22–32)
Calcium: 9.2 mg/dL (ref 8.9–10.3)
Creatinine, Ser: 4.03 mg/dL — ABNORMAL HIGH (ref 0.61–1.24)
GFR calc Af Amer: 20 mL/min — ABNORMAL LOW (ref >60)
GFR calc non Af Amer: 17 mL/min — ABNORMAL LOW (ref >60)
GLUCOSE: 205 mg/dL — AB (ref 65–99)
POTASSIUM: 5 mmol/L (ref 3.5–5.1)
Sodium: 136 mmol/L (ref 135–145)
Total Bilirubin: 1.3 mg/dL — ABNORMAL HIGH (ref 0.3–1.2)
Total Protein: 7.2 g/dL (ref 6.5–8.1)

## 2015-11-01 LAB — CBC WITH DIFFERENTIAL/PLATELET
BASOS ABS: 0 10*3/uL (ref 0.0–0.1)
Basophils Relative: 0 %
Eosinophils Absolute: 0.1 10*3/uL (ref 0.0–0.7)
Eosinophils Relative: 1 %
HEMATOCRIT: 37 % — AB (ref 39.0–52.0)
Hemoglobin: 12.6 g/dL — ABNORMAL LOW (ref 13.0–17.0)
LYMPHS ABS: 2.7 10*3/uL (ref 0.7–4.0)
LYMPHS PCT: 32 %
MCH: 29 pg (ref 26.0–34.0)
MCHC: 34.1 g/dL (ref 30.0–36.0)
MCV: 85.3 fL (ref 78.0–100.0)
MONO ABS: 0.8 10*3/uL (ref 0.1–1.0)
MONOS PCT: 10 %
NEUTROS ABS: 4.9 10*3/uL (ref 1.7–7.7)
Neutrophils Relative %: 57 %
Platelets: 273 10*3/uL (ref 150–400)
RBC: 4.34 MIL/uL (ref 4.22–5.81)
RDW: 13.3 % (ref 11.5–15.5)
WBC: 8.6 10*3/uL (ref 4.0–10.5)

## 2015-11-01 LAB — I-STAT CG4 LACTIC ACID, ED: Lactic Acid, Venous: 1.5 mmol/L (ref 0.5–1.9)

## 2015-11-01 NOTE — Telephone Encounter (Signed)
Amy advised me that patient's last vanc trough was 15 and SCr was 0.8.  Received labs today that patient's SCr is now 3.3 and VT 61. BP was also high last week (220/124).  Advised Amy to call patient and send to ED for acute renal failure.  Will discuss with Dr. Megan Salon tomorrow AM.   Rubin Payor L. Nicole Kindred, PharmD Infectious Diseases Clinical Pharmacist Pager: [phone] 11/01/2015 4:37 PM

## 2015-11-01 NOTE — ED Triage Notes (Signed)
Patient arrives from home after receiving call from home care nurse. Per patient today he had lab work drawn. Tonight he received call telling him that his "kidney numbers" were off and his "vancomycin levels" were too high. Currently has PICC to right upper arm. DM2. 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

## 2015-11-02 ENCOUNTER — Inpatient Hospital Stay (HOSPITAL_COMMUNITY): Payer: BC Managed Care – PPO

## 2015-11-02 ENCOUNTER — Inpatient Hospital Stay (HOSPITAL_COMMUNITY)
Admission: EM | Admit: 2015-11-02 | Discharge: 2015-11-10 | DRG: 683 | Disposition: A | Discharge status: Home Health | Payer: BC Managed Care – PPO | Attending: Internal Medicine | Admitting: Internal Medicine

## 2015-11-02 DIAGNOSIS — I10 Essential (primary) hypertension: Secondary | ICD-10-CM

## 2015-11-02 DIAGNOSIS — T82898D Other specified complication of vascular prosthetic devices, implants and grafts, subsequent encounter: Secondary | ICD-10-CM

## 2015-11-02 DIAGNOSIS — L089 Local infection of the skin and subcutaneous tissue, unspecified: Secondary | ICD-10-CM

## 2015-11-02 DIAGNOSIS — Z79899 Other long term (current) drug therapy: Secondary | ICD-10-CM | POA: Diagnosis not present

## 2015-11-02 DIAGNOSIS — Z8249 Family history of ischemic heart disease and other diseases of the circulatory system: Secondary | ICD-10-CM | POA: Diagnosis not present

## 2015-11-02 DIAGNOSIS — E1140 Type 2 diabetes mellitus with diabetic neuropathy, unspecified: Secondary | ICD-10-CM

## 2015-11-02 DIAGNOSIS — Z794 Long term (current) use of insulin: Secondary | ICD-10-CM

## 2015-11-02 DIAGNOSIS — M869 Osteomyelitis, unspecified: Secondary | ICD-10-CM | POA: Diagnosis present

## 2015-11-02 DIAGNOSIS — Z87891 Personal history of nicotine dependence: Secondary | ICD-10-CM | POA: Diagnosis not present

## 2015-11-02 DIAGNOSIS — J45909 Unspecified asthma, uncomplicated: Secondary | ICD-10-CM | POA: Diagnosis present

## 2015-11-02 DIAGNOSIS — B9561 Methicillin susceptible Staphylococcus aureus infection as the cause of diseases classified elsewhere: Secondary | ICD-10-CM | POA: Diagnosis present

## 2015-11-02 DIAGNOSIS — E11621 Type 2 diabetes mellitus with foot ulcer: Secondary | ICD-10-CM | POA: Diagnosis present

## 2015-11-02 DIAGNOSIS — E11628 Type 2 diabetes mellitus with other skin complications: Secondary | ICD-10-CM | POA: Diagnosis present

## 2015-11-02 DIAGNOSIS — Z89422 Acquired absence of other left toe(s): Secondary | ICD-10-CM | POA: Diagnosis not present

## 2015-11-02 DIAGNOSIS — B961 Klebsiella pneumoniae [K. pneumoniae] as the cause of diseases classified elsewhere: Secondary | ICD-10-CM | POA: Diagnosis present

## 2015-11-02 DIAGNOSIS — G252 Other specified forms of tremor: Secondary | ICD-10-CM | POA: Diagnosis not present

## 2015-11-02 DIAGNOSIS — N179 Acute kidney failure, unspecified: Secondary | ICD-10-CM | POA: Diagnosis present

## 2015-11-02 DIAGNOSIS — K219 Gastro-esophageal reflux disease without esophagitis: Secondary | ICD-10-CM | POA: Diagnosis present

## 2015-11-02 DIAGNOSIS — T368X5A Adverse effect of other systemic antibiotics, initial encounter: Secondary | ICD-10-CM | POA: Diagnosis present

## 2015-11-02 DIAGNOSIS — E669 Obesity, unspecified: Secondary | ICD-10-CM | POA: Diagnosis present

## 2015-11-02 DIAGNOSIS — L97509 Non-pressure chronic ulcer of other part of unspecified foot with unspecified severity: Secondary | ICD-10-CM | POA: Diagnosis present

## 2015-11-02 DIAGNOSIS — E1169 Type 2 diabetes mellitus with other specified complication: Secondary | ICD-10-CM

## 2015-11-02 DIAGNOSIS — Z6833 Body mass index (BMI) 33.0-33.9, adult: Secondary | ICD-10-CM | POA: Diagnosis not present

## 2015-11-02 DIAGNOSIS — Z823 Family history of stroke: Secondary | ICD-10-CM | POA: Diagnosis not present

## 2015-11-02 DIAGNOSIS — Z833 Family history of diabetes mellitus: Secondary | ICD-10-CM | POA: Diagnosis not present

## 2015-11-02 DIAGNOSIS — D649 Anemia, unspecified: Secondary | ICD-10-CM | POA: Diagnosis present

## 2015-11-02 LAB — GLUCOSE, CAPILLARY
GLUCOSE-CAPILLARY: 175 mg/dL — AB (ref 65–99)
GLUCOSE-CAPILLARY: 138 mg/dL — AB (ref 65–99)
GLUCOSE-CAPILLARY: 192 mg/dL — AB (ref 65–99)
Glucose-Capillary: 186 mg/dL — ABNORMAL HIGH (ref 65–99)
Glucose-Capillary: 154 mg/dL — ABNORMAL HIGH (ref 65–99)

## 2015-11-02 LAB — URINALYSIS, ROUTINE W REFLEX MICROSCOPIC
Bilirubin Urine: NEGATIVE
GLUCOSE, UA: NEGATIVE mg/dL
Hgb urine dipstick: NEGATIVE
Ketones, ur: NEGATIVE mg/dL
LEUKOCYTES UA: NEGATIVE
Nitrite: NEGATIVE
PH: 5 (ref 5.0–8.0)
Protein, ur: NEGATIVE mg/dL
SPECIFIC GRAVITY, URINE: 1.015 (ref 1.005–1.030)

## 2015-11-02 LAB — VANCOMYCIN, RANDOM: Vancomycin Rm: 57

## 2015-11-02 LAB — I-STAT CG4 LACTIC ACID, ED: LACTIC ACID, VENOUS: 0.89 mmol/L (ref 0.5–1.9)

## 2015-11-02 MED ORDER — AMLODIPINE BESYLATE 5 MG PO TABS
10.0000 mg | ORAL_TABLET | Freq: Every day | ORAL | Status: DC
  Administered 2015-11-02 – 2015-11-10 (×9): 10 mg via ORAL
  Filled 2015-11-02 (×9): qty 2

## 2015-11-02 MED ORDER — SODIUM CHLORIDE 0.9% FLUSH
10.0000 mL | INTRAVENOUS | Status: DC | PRN
  Administered 2015-11-02 – 2015-11-08 (×5): 10 mL
  Filled 2015-11-02 (×5): qty 40

## 2015-11-02 MED ORDER — SODIUM CHLORIDE 0.9 % IV SOLN
500.0000 mg | INTRAVENOUS | Status: DC
  Administered 2015-11-02 – 2015-11-03 (×2): 0.5 g via INTRAVENOUS
  Filled 2015-11-02 (×3): qty 0.5

## 2015-11-02 MED ORDER — ALTEPLASE 2 MG IJ SOLR
2.0000 mg | Freq: Once | INTRAMUSCULAR | Status: AC
  Administered 2015-11-02: 2 mg
  Filled 2015-11-02: qty 2

## 2015-11-02 MED ORDER — SODIUM CHLORIDE 0.9 % IV SOLN: INTRAVENOUS | Status: DC

## 2015-11-02 MED ORDER — HEPARIN SODIUM (PORCINE) 5000 UNIT/ML IJ SOLN
5000.0000 [IU] | Freq: Three times a day (TID) | INTRAMUSCULAR | Status: DC
  Administered 2015-11-02 – 2015-11-04 (×9): 5000 [IU] via SUBCUTANEOUS
  Filled 2015-11-02 (×15): qty 1

## 2015-11-02 MED ORDER — COLLAGENASE 250 UNIT/GM EX OINT
1.0000 "application " | TOPICAL_OINTMENT | Freq: Every day | CUTANEOUS | Status: DC
  Administered 2015-11-02 – 2015-11-03 (×2): 1 via TOPICAL
  Filled 2015-11-02: qty 30

## 2015-11-02 MED ORDER — INSULIN ASPART 100 UNIT/ML ~~LOC~~ SOLN
0.0000 [IU] | Freq: Three times a day (TID) | SUBCUTANEOUS | Status: DC
  Administered 2015-11-02 (×2): 3 [IU] via SUBCUTANEOUS
  Administered 2015-11-02: 2 [IU] via SUBCUTANEOUS
  Administered 2015-11-03: 5 [IU] via SUBCUTANEOUS
  Administered 2015-11-03: 2 [IU] via SUBCUTANEOUS
  Administered 2015-11-04: 3 [IU] via SUBCUTANEOUS
  Administered 2015-11-04 – 2015-11-05 (×4): 2 [IU] via SUBCUTANEOUS
  Administered 2015-11-06: 3 [IU] via SUBCUTANEOUS
  Administered 2015-11-06 (×2): 2 [IU] via SUBCUTANEOUS
  Administered 2015-11-07 – 2015-11-09 (×7): 3 [IU] via SUBCUTANEOUS
  Administered 2015-11-09: 2 [IU] via SUBCUTANEOUS
  Administered 2015-11-09 – 2015-11-10 (×2): 3 [IU] via SUBCUTANEOUS
  Administered 2015-11-10: 5 [IU] via SUBCUTANEOUS

## 2015-11-02 MED ORDER — OXYCODONE HCL 5 MG PO TABS
10.0000 mg | ORAL_TABLET | Freq: Four times a day (QID) | ORAL | Status: DC | PRN
  Administered 2015-11-02 – 2015-11-09 (×9): 10 mg via ORAL
  Filled 2015-11-02 (×9): qty 2

## 2015-11-02 MED ORDER — HYDRALAZINE HCL 20 MG/ML IJ SOLN: 10.0000 mg | INTRAMUSCULAR | Status: DC | PRN

## 2015-11-02 MED ORDER — INSULIN GLARGINE 100 UNIT/ML ~~LOC~~ SOLN
10.0000 [IU] | Freq: Every day | SUBCUTANEOUS | Status: DC
  Administered 2015-11-02 – 2015-11-09 (×8): 10 [IU] via SUBCUTANEOUS
  Filled 2015-11-02 (×9): qty 0.1

## 2015-11-02 MED ORDER — ONDANSETRON 4 MG PO TBDP
4.0000 mg | ORAL_TABLET | Freq: Once | ORAL | Status: AC
  Administered 2015-11-02: 4 mg via ORAL
  Filled 2015-11-02: qty 1

## 2015-11-02 MED ORDER — HYDROCODONE-ACETAMINOPHEN 5-325 MG PO TABS
2.0000 | ORAL_TABLET | Freq: Once | ORAL | Status: AC
  Administered 2015-11-02: 2 via ORAL
  Filled 2015-11-02: qty 2

## 2015-11-02 MED ORDER — PREGABALIN 75 MG PO CAPS
75.0000 mg | ORAL_CAPSULE | Freq: Two times a day (BID) | ORAL | Status: DC
  Administered 2015-11-02 (×2): 75 mg via ORAL
  Filled 2015-11-02 (×2): qty 1

## 2015-11-02 MED ORDER — PREGABALIN 25 MG PO CAPS
25.0000 mg | ORAL_CAPSULE | Freq: Two times a day (BID) | ORAL | Status: DC
  Administered 2015-11-02 – 2015-11-03 (×3): 25 mg via ORAL
  Filled 2015-11-02 (×3): qty 1

## 2015-11-02 MED ORDER — SODIUM CHLORIDE 0.9 % IV SOLN
INTRAVENOUS | Status: DC
  Administered 2015-11-02 – 2015-11-04 (×8): via INTRAVENOUS
  Administered 2015-11-06 – 2015-11-07 (×3): 1000 mL via INTRAVENOUS
  Administered 2015-11-07: 03:00:00 via INTRAVENOUS
  Administered 2015-11-07: 1000 mL via INTRAVENOUS
  Administered 2015-11-08: 02:00:00 via INTRAVENOUS

## 2015-11-02 MED ORDER — AMLODIPINE BESYLATE 5 MG PO TABS: 5.0000 mg | ORAL_TABLET | Freq: Every day | ORAL | Status: DC

## 2015-11-02 NOTE — Progress Notes (Signed)
Lattie Haw, RN with IV team on unit to assess patient's PICC. Lattie Haw stated she will notify RN when PICC ready to use. She stated if she uses alteplase that PICC can not be used 2 hours.

## 2015-11-02 NOTE — Progress Notes (Signed)
NURSING PROGRESS NOTE  Dreyden Mumaw CheekMRN: [id number] Admission Data: 10/23/2015 at 4:30AM Attending Provider: Verlee Monte, MD PCP: [name], MD Code status: Full  Allergies: No Known Allergies  Past Medical History:  Past Medical History:  Diagnosis Date  . Asthma   . Diabetes mellitus   . GERD (gastroesophageal reflux disease)   . Headache(784.0)   . Hypertension    Past Surgical History:  Past Surgical History:  Procedure Laterality Date  . AMPUTATION Left 07/26/2015   Procedure: AMPUTATION LEFT FIFTH RAY;  Surgeon: [name], MD;  Location: [facility];  Service: Orthopedics;  Laterality: Left;  . bilateral hip pins placed     Danny Fuller is a 40 y.o. male patient, arrived to floor in room 5W09 via stretcher, transferred from ED. Patient alert and oriented X 4. No acute distress noted. Complains of pain in left foot.   Vital signs: Oral temperature 97.6 F (36.4 C), Blood pressure 166/93, Pulse 79, RR 18, SpO2 100 % on room air. Height 5'9", weight 232 lbs (105.4 kg).   IV access: Right upper arm PICC ; condition patent and no redness.  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 Information packet given to patient. Fall risk assessed, SR up X2, patient able to verbalize understanding of risks associated with falls and to call nurse or staff to assist before getting out of bed. Patient oriented to room and equipment. Call bell within reach.

## 2015-11-02 NOTE — Consult Note (Signed)
[facility] Consultation Note Requesting Physician:  Dr. Hartford Poli Reason for Consult:  Acute kidney injury  HPI: The patient is a 40 y.o. year-old with  PMH of DM2, HTN, GERD, osteomyelitis of the left foot (had ray amputation and abscess drainage 07/26/15 with cx + group B strep and MRSE that failed oral ATB,s and was admitted  8/27-9/1 with a wound infection and MRI confirming osteo. Cultures grew staph and Klebsiella. He was started on vanco and zosyn, then changed to vanco/ertapenem to complete a course 11/21/15.  PICC placed RUE for ATB's and discharged to home. Has been running high BP's at home and also developed some edema that he attributed to Lyrica.  He recently had lasix (20 mg) added to his regimen of lisinopril and amlodipine.  He was called to come into the hospital yesterday because of labs rec'd showing a creatinine of 3.3 and a vanco trough of 61 (previous labs - don't know what date - showed creatinine of 0.8 and vanco trough of 15).  On admission last evening creatinine 4, not repeated this AM. We are asked to see. Vanco has been stopped, and ACE and furosemide held. No urine has been obtained for UA. UOP recorded as 250 ml since admission.    Creatinine summary for reference: Date/Time Value Ref Range Status  11/01/2015 09:44 PM 4.03 (H) 0.61 - 1.24 mg/dL Final  10/18/2015 07:55 PM 0.80 0.61 - 1.24 mg/dL Final  10/14/2015 05:00 AM 0.73 0.61 - 1.24 mg/dL Final  10/12/2015 06:55 AM 0.68 0.61 - 1.24 mg/dL Final  10/11/2015 03:24 AM 0.91 0.61 - 1.24 mg/dL Final  10/10/2015 08:30 PM 0.82 0.61 - 1.24 mg/dL Final  07/29/2015 05:48 AM 0.58 (L) 0.61 - 1.24 mg/dL Final  07/28/2015 06:53 AM 0.61 0.61 - 1.24 mg/dL Final  07/27/2015 03:25 AM 0.67 0.61 - 1.24 mg/dL Final  07/26/2015 04:06 AM 0.60 (L) 0.61 - 1.24 mg/dL Final  07/25/2015 05:02 AM 0.61 0.61 - 1.24 mg/dL Final  07/24/2015 04:33 AM 0.76 0.61 - 1.24 mg/dL Final  07/23/2015 05:31 PM 0.95 0.61 - 1.24 mg/dL Final  04/23/2013 01:32 PM 0.7  0.4 - 1.5 mg/dL Final  02/08/2012 03:10 PM 0.64 0.50 - 1.35 mg/dL Final  07/14/2011 10:36 AM 0.7 0.4 - 1.5 mg/dL Final  04/18/2010 10:30 AM 0.9 0.4 - 1.5 mg/dL Final  04/17/2008 09:32 AM 1.1 0.4 - 1.5 mg/dL Final     Past Medical History:  Diagnosis Date  . Asthma   . Diabetes mellitus   . GERD (gastroesophageal reflux disease)   . Headache(784.0)   . Hypertension      Past Surgical History:  Procedure Laterality Date  . AMPUTATION Left 07/26/2015   Procedure: AMPUTATION LEFT FIFTH RAY;  Surgeon: [name], MD;  Location: [place];  Service: Orthopedics;  Laterality: Left;  . bilateral hip pins placed       Family History  Problem Relation Age of Onset  . Arthritis    . Diabetes    . Hypertension    . Hyperlipidemia    . Stroke    . Sudden death    . Diabetes Mother   . Diabetes Sister   . Diabetes Brother    Social History:  reports that he has quit smoking. His smoking use included Cigarettes. He has never used smokeless tobacco. He reports that he does not drink alcohol or use drugs.  Allergies: No Known Allergies  Home medications: Prior to Admission medications   Medication Sig Start Date End Date  Taking? Authorizing Provider  amLODipine (NORVASC) 5 MG tablet Take 1 tablet (5 mg total) by mouth daily. 10/22/15  Yes [name], MD  collagenase (SANTYL) ointment Apply 1 application topically daily. Wash left foot with soap and water daily dry the foot and then apply Santyl to a dry gauze dressing apply Ace wrap continue nonweightbearing on the left foot change dressing daily. 10/11/15  Yes [name], MD  ertapenem 1 g in sodium chloride 0.9 % 50 mL Inject 1 g into the vein daily. 10/15/15 11/21/15 Yes [name], MD  glucose blood (ONETOUCH VERIO) test strip Use to check blood sugar 1 time per day. 09/16/15  Yes [name], MD  insulin aspart (NOVOLOG FLEXPEN) 100 UNIT/ML FlexPen Before each meal 3 times a day, 140-199 - 2 units, 200-250 - 4 units, 251-299 - 6  units,  300-349 - 8 units,  350 or above 10 units. Insulin PEN if approved, provide syringes and needles if needed. 07/29/15  Yes [name], MD  Insulin Glargine (LANTUS) 100 UNIT/ML Solostar Pen Inject 20 Units into the skin daily at 10 pm. 07/29/15  Yes [name], MD  Insulin Pen Needle (PEN NEEDLES) 31G X 6 MM MISC For a month supply 07/29/15  Yes [name], MD  lisinopril (PRINIVIL,ZESTRIL) 20 MG tablet Take 2 tablets (40 mg total) by mouth daily. 10/15/15  Yes [name], MD  metFORMIN (GLUCOPHAGE) 1000 MG tablet Take 0.5 tablets (500 mg total) by mouth 2 (two) times daily with a meal. 08/16/15  Yes [name], MD  oxyCODONE 10 MG TABS Take 1 tablet (10 mg total) by mouth every 6 (six) hours as needed for moderate pain. 10/15/15  Yes [name], MD  pregabalin (LYRICA) 75 MG capsule Take 1 capsule (75 mg total) by mouth 2 (two) times daily. 10/15/15  Yes [name], MD  furosemide (LASIX) 20 MG tablet Take 1 tablet (20 mg total) by mouth daily. Patient not taking: Reported on 11/02/2015 10/22/15   [name], MD  lactobacilus acidophilus & bulgar [facility]) TABS chewable tablet Take 1 tablet by mouth 3 (three) times daily with meals. Patient not taking: Reported on 08/16/2015 07/29/15   [name], MD    Inpatient medications: . amLODipine  10 mg Oral Daily  . collagenase  1 application Topical Daily  . ertapenem  500 mg Intravenous Q24H  . heparin  5,000 Units Subcutaneous Q8H  . insulin aspart  0-15 Units Subcutaneous TID WC  . insulin glargine  10 Units Subcutaneous QHS  . pregabalin  25 mg Oral BID    Review of Systems + decreased frequency of voiding (prev 12-13X/day, more recently 4-5X) No hematuria, tea or coca colored urine and no foaminess to the urine + LE edema since starting lyrica + elevated BP's No CP, SOB + neuropathy pain and L foot pain   Physical Exam:  BP (!) 160/88 (BP Location: Left Arm)   Pulse 73   Temp 97.9 F (36.6 C) (Oral)   Resp 18   Ht 5\' 9"  (1.753 m)   Wt  105.4 kg (232 lb 6.4 oz)   SpO2 100%   BMI 34.32 kg/m    Gen: Very nice soft spoken AAM NAD Lines/tubes: PICC line RU arm Skin: no rash, cyanosis Neck: no JVD, no bruits or LAN Chest: Clear without crackles or wheezes Heart: Rhythm regular, S1S2 normal. No S3 or S4. no rub or gallop Abdomen: soft, + BS, no focal abdominal tenderness Ext: 1-2+  edema LLE, trace edema RLE L foot not unwrapped Neuro: alert, Ox3, no focal deficit  Labs:  Recent Labs Lab 11/01/15 2144  NA 136  K 5.0  CL 105  CO2 22  GLUCOSE 205*  BUN 35*  CREATININE 4.03*  CALCIUM 9.2     Recent Labs Lab 11/01/15 2144  AST 28  ALT 18  ALKPHOS 69  BILITOT 1.3*  PROT 7.2  ALBUMIN 3.9     Recent Labs Lab 11/01/15 2144  WBC 8.6  NEUTROABS 4.9  HGB 12.6*  HCT 37.0*  MCV 85.3  PLT 273     Recent Labs Lab 11/02/15 0420 11/02/15 0759 11/02/15 1219  GLUCAP 186* 175* 154*   Results for TRIGG, BANKOWSKI (MRN [id number]) as of 11/02/2015 16:26  11/02/2015 15:12  Appearance CLEAR  Bilirubin Urine NEGATIVE  Color, Urine YELLOW  Glucose NEGATIVE  Hgb urine dipstick NEGATIVE  Ketones, ur NEGATIVE  Leukocytes, UA NEGATIVE  Nitrite NEGATIVE  pH 5.0  Protein NEGATIVE  Specific Gravity, Urine 1.015    Xrays/Other Studies: Dg Chest Port 1 View  Result Date: 11/02/2015 CLINICAL DATA:  Status post PICC line placement EXAM: PORTABLE CHEST 1 VIEW COMPARISON:  10/14/2015 FINDINGS: Cardiac shadow is stable. The lungs are well aerated bilaterally. A right-sided PICC line is noted in the distal superior vena cava. The bony structures are within normal limits. IMPRESSION: PICC line in satisfactory position. Electronically Signed   By: Inez Catalina M.D.   On: 11/02/2015 07:36    Background: 40 y.o. year-old with  PMH of DM2, HTN, GERD, osteomyelitis of the left foot (had ray amputation and abscess drainage 07/26/15 with cx + group B strep and MRSE that failed oral ATB,s and was admitted  8/27-9/1 with a  wound infection and MRI confirming osteo. Cultures grew S aureus and klebsiella ornitholytica.  He was started on vanco and zosyn, then changed to vanco/ertapenem to complete a course 11/21/15.  PICC placed RUE for ATB's and discharged to home. Has been running high BP's at home and had lasix (20 mg) added to his regimen of lisinopril and amlodipine recently for edema.   He was called to come into the hospital yesterday because of labs rec'd showing a creatinine of 3.3 and a vanco trough of 61 (previous labs - don't know what date - showed creatinine of 0.8 and vanco trough of 15. On 10/14/15 trough was 19).  On admission last evening creatinine 4 and this AM we are asked to see.   Assessment 1. Acute kidney injury presumed related to vancomycin toxicity, possible compounded by use of lisinopril and lasix. Urinalysis entirely negative. Non-oliguric by his history. 2. Diabetic foot ulcer with osteomyelitis (staph aureus, Klebsiella) 3. DM2 - lantus. 4. HTN  - on amlodipine/lisinopril/furosemide PTA - lisinopril on hold  Recommendations 1. Stop vanco, hold lisinopril and lasix as you have done 2. Repeat vanco level and follow levels to assess clearance rate 3. ATB recommendations for continued Rx of osteo per ID 4. Hold metformin in face of AKI 5. DM mgmt per primary service 6. Agree with amlodipine and hydralazine for BP. No diuretics for now.   Jamal Maes,  MD [facility] [phone] pager 11/02/2015, 3:01 PM

## 2015-11-02 NOTE — Telephone Encounter (Signed)
He has developed acute renal insufficiency, most likely related to his vancomycin. He was readmitted to the hospital last night and vancomycin was held.

## 2015-11-02 NOTE — Progress Notes (Signed)
[facility]  Patient Status: Active pt with [facility] prior to admission  [facility] is providing the following services: HHRN and home infusion pharmacy team on home IV ABX.  [facility] hospital team will follow and support transition home when ordered to ensure [facility] needs are met.  If patient discharges after hours, please call [phone].   Larry Sierras 11/02/2015, 8:14 AM

## 2015-11-02 NOTE — H&P (Signed)
History and Physical    Danny Fuller [id number] DOB: 1975-02-27 DOA: 11/02/2015   PCP: [name], MD Chief Complaint:  Chief Complaint  Patient presents with  . Abnormal Lab    HPI: Danny Fuller is a 40 y.o. male with medical history significant of DM2 with left diabetic foot infection and osteomyelitis.  Patient was discharged on 9/1 and was to complete a 6 week course of invanz and vanc (see Dr. Hale Bogus last progress note).  He is sent in today by his PCP (Dr. Sarajane Jews) due to lab work drawn this morning which shows creat of 3.3 and vanc trough of 61.  Clinically he is asymptomatic from a renal standpoint.  He has 8/10 pain in L foot but ulcer has been improving he says.  ED Course: Creat of 4.0.  Review of Systems: As per HPI otherwise 10 point review of systems negative.    Past Medical History:  Diagnosis Date  . Asthma   . Diabetes mellitus   . GERD (gastroesophageal reflux disease)   . Headache(784.0)   . Hypertension     Past Surgical History:  Procedure Laterality Date  . AMPUTATION Left 07/26/2015   Procedure: AMPUTATION LEFT FIFTH RAY;  Surgeon: [name], MD;  Location: [place];  Service: Orthopedics;  Laterality: Left;  . bilateral hip pins placed       reports that he has quit smoking. His smoking use included Cigarettes. He has never used smokeless tobacco. He reports that he does not drink alcohol or use drugs.  No Known Allergies  Family History  Problem Relation Age of Onset  . Arthritis    . Diabetes    . Hypertension    . Hyperlipidemia    . Stroke    . Sudden death    . Diabetes Mother   . Diabetes Sister   . Diabetes Brother       Prior to Admission medications   Medication Sig Start Date End Date Taking? Authorizing Provider  amLODipine (NORVASC) 5 MG tablet Take 1 tablet (5 mg total) by mouth daily. 10/22/15  Yes [name], MD  collagenase (SANTYL) ointment Apply 1 application topically daily. Wash left foot with soap  and water daily dry the foot and then apply Santyl to a dry gauze dressing apply Ace wrap continue nonweightbearing on the left foot change dressing daily. 10/11/15  Yes [name], MD  ertapenem 1 g in sodium chloride 0.9 % 50 mL Inject 1 g into the vein daily. 10/15/15 11/21/15 Yes [name], MD  glucose blood (ONETOUCH VERIO) test strip Use to check blood sugar 1 time per day. 09/16/15  Yes [name], MD  insulin aspart (NOVOLOG FLEXPEN) 100 UNIT/ML FlexPen Before each meal 3 times a day, 140-199 - 2 units, 200-250 - 4 units, 251-299 - 6 units,  300-349 - 8 units,  350 or above 10 units. Insulin PEN if approved, provide syringes and needles if needed. 07/29/15  Yes [name], MD  Insulin Glargine (LANTUS) 100 UNIT/ML Solostar Pen Inject 20 Units into the skin daily at 10 pm. 07/29/15  Yes [name], MD  Insulin Pen Needle (PEN NEEDLES) 31G X 6 MM MISC For a month supply 07/29/15  Yes [name], MD  lisinopril (PRINIVIL,ZESTRIL) 20 MG tablet Take 2 tablets (40 mg total) by mouth daily. 10/15/15  Yes [name], MD  metFORMIN (GLUCOPHAGE) 1000 MG tablet Take 0.5 tablets (500 mg total) by mouth 2 (two) times daily with  a meal. 08/16/15  Yes [name], MD  oxyCODONE 10 MG TABS Take 1 tablet (10 mg total) by mouth every 6 (six) hours as needed for moderate pain. 10/15/15  Yes [name], MD  pregabalin (LYRICA) 75 MG capsule Take 1 capsule (75 mg total) by mouth 2 (two) times daily. 10/15/15  Yes [name], MD  furosemide (LASIX) 20 MG tablet Take 1 tablet (20 mg total) by mouth daily. Patient not taking: Reported on 11/02/2015 10/22/15   [name], MD  lactobacilus acidophilus & bulgar [facility]) TABS chewable tablet Take 1 tablet by mouth 3 (three) times daily with meals. Patient not taking: Reported on 08/16/2015 07/29/15   [name], MD    Physical Exam: Vitals:   11/01/15 2252 11/02/15 0130 11/02/15 0132 11/02/15 0230  BP: 164/86 171/97 171/97 166/99  Pulse: 90  80 83  Resp: 20  16   Temp:   98.2 F  (36.8 C)   TempSrc:   Oral   SpO2: 99%  99% 99%  Weight:   103.9 kg (229 lb)   Height:   5\' 9"  (1.753 m)       Constitutional: NAD, calm, comfortable Eyes: PERRL, lids and conjunctivae normal ENMT: Mucous membranes are moist. Posterior pharynx clear of any exudate or lesions.Normal dentition.  Neck: normal, supple, no masses, no thyromegaly Respiratory: clear to auscultation bilaterally, no wheezing, no crackles. Normal respiratory effort. No accessory muscle use.  Cardiovascular: Regular rate and rhythm, no murmurs / rubs / gallops. No extremity edema. 2+ pedal pulses. No carotid bruits.  Abdomen: no tenderness, no masses palpated. No hepatosplenomegaly. Bowel sounds positive.  Musculoskeletal: no clubbing / cyanosis. No joint deformity upper and lower extremities. Good ROM, no contractures. Normal muscle tone.  Skin: no rashes, lesions, ulcers. No induration Neurologic: CN 2-12 grossly intact. Sensation intact, DTR normal. Strength 5/5 in all 4.  Psychiatric: Normal judgment and insight. Alert and oriented x 3. Normal mood.    Labs on Admission: I have personally reviewed following labs and imaging studies  CBC:  Recent Labs Lab 11/01/15 2144  WBC 8.6  NEUTROABS 4.9  HGB 12.6*  HCT 37.0*  MCV 85.3  PLT 123456   Basic Metabolic Panel:  Recent Labs Lab 11/01/15 2144  NA 136  K 5.0  CL 105  CO2 22  GLUCOSE 205*  BUN 35*  CREATININE 4.03*  CALCIUM 9.2   GFR: Estimated Creatinine Clearance: 29.5 mL/min (by C-G formula based on SCr of 4.03 mg/dL (H)). Liver Function Tests:  Recent Labs Lab 11/01/15 2144  AST 28  ALT 18  ALKPHOS 69  BILITOT 1.3*  PROT 7.2  ALBUMIN 3.9   No results for input(s): LIPASE, AMYLASE in the last 168 hours. No results for input(s): AMMONIA in the last 168 hours. Coagulation Profile: No results for input(s): INR, PROTIME in the last 168 hours. Cardiac Enzymes: No results for input(s): CKTOTAL, CKMB, CKMBINDEX, TROPONINI in the  last 168 hours. BNP (last 3 results) No results for input(s): PROBNP in the last 8760 hours. HbA1C: No results for input(s): HGBA1C in the last 72 hours. CBG: No results for input(s): GLUCAP in the last 168 hours. Lipid Profile: No results for input(s): CHOL, HDL, LDLCALC, TRIG, CHOLHDL, LDLDIRECT in the last 72 hours. Thyroid Function Tests: No results for input(s): TSH, T4TOTAL, FREET4, T3FREE, THYROIDAB in the last 72 hours. Anemia Panel: No results for input(s): VITAMINB12, FOLATE, FERRITIN, TIBC, IRON, RETICCTPCT in the last 72 hours. Urine analysis:  Component Value Date/Time   COLORURINE YELLOW 10/18/2015 1934   APPEARANCEUR CLEAR 10/18/2015 1934   LABSPEC 1.021 10/18/2015 1934   PHURINE 6.5 10/18/2015 1934   GLUCOSEU NEGATIVE 10/18/2015 1934   HGBUR NEGATIVE 10/18/2015 1934   HGBUR negative 04/17/2008 0929   BILIRUBINUR NEGATIVE 10/18/2015 1934   BILIRUBINUR n 04/23/2013 1350   KETONESUR NEGATIVE 10/18/2015 1934   PROTEINUR NEGATIVE 10/18/2015 1934   UROBILINOGEN 0.2 04/23/2013 1350   UROBILINOGEN 4.0 (H) 08/09/2009 0938   NITRITE NEGATIVE 10/18/2015 1934   LEUKOCYTESUR NEGATIVE 10/18/2015 1934   Sepsis Labs: @LABRCNTIP (procalcitonin:4,lacticidven:4) )No results found for this or any previous visit (from the past 240 hour(s)).   Radiological Exams on Admission: No results found.  EKG: Independently reviewed.  Assessment/Plan Principal Problem:   ARF (acute renal failure) (HCC) Active Problems:   Type 2 diabetes mellitus with diabetic neuropathy, with long-term current use of insulin (HCC)   Essential hypertension   Diabetic foot infection ([place])    1. AKF - likely vanc toxicity 1. Stopping vancomycin 2. UA and urine for Eos 3. Hydration with NS 4. Holding nephrotoxic ACEi, lasix 5. Hold metformin 6. Repeat BMP tomorrow AM 7. May warrant nephrology consult in AM 2. Diabetic foot infection - 1. Continue invanz 2. Stop vanc 3. Call ID in AM to see  if they want to replace vanc with another agent 3. HTN - BPs running very high over past week with SBPs as high as the 200s per patient (see phone notes to Dr. Sarajane Jews) 1. Continue amlodipine and actually will increase since holding ACEi and lasix 2. Adding hydralazine PRN 4. DM2 - 1. Holding metformin 2. Lantus 10 units QHS 3. Mod dose SSI AC/HS   DVT prophylaxis: Heparin [place] Code Status: Full Family Communication: No family in room Consults called: None Admission status: Admit to inpatient   Etta Quill DO [facility] Pager [phone] from 7PM-7AM  If 7AM-7PM, please contact the day physician for the patient [email or web address] Password [email or web address]  11/02/2015, 3:38 AM

## 2015-11-02 NOTE — Progress Notes (Signed)
RN paged Kirby,NP that patient needed chest xray due to PICC line being partially pulled out and could possibly be dislodged per IV team. Kirby,NP returned page and placed order for chest xray. Will continue to monitor and treat per MD orders.

## 2015-11-02 NOTE — Progress Notes (Signed)
PROGRESS NOTE  Danny Fuller  [id number] DOB: 11/24/1975 DOA: 11/02/2015 PCP: [name], MD Outpatient Specialists:  Subjective: Denies any complaints  Brief Narrative:  Danny Fuller is a 40 y.o. male with medical history significant of DM2 with left diabetic foot infection and osteomyelitis.  Patient was discharged on 9/1 and was to complete a 6 week course of invanz and vanc (see Dr. Hale Bogus last progress note).  He is sent in today by his PCP (Dr. Sarajane Jews) due to lab work drawn this morning which shows creat of 3.3 and vanc trough of 61.  Clinically he is asymptomatic from a renal standpoint.  He has 8/10 pain in L foot but ulcer has been improving he says.  Assessment & Plan:   Principal Problem:   ARF (acute renal failure) (HCC) Active Problems:   Type 2 diabetes mellitus with diabetic neuropathy, with long-term current use of insulin (HCC)   Essential hypertension   Diabetic foot infection ([place])   AKI -Baseline creatinine 0.8 from 10/18/2015 came in with creatinine of 4.0. -Reportedly vancomycin trough was 61 from outpatient records. Patient also on lisinopril 40 mg. -UA and renal ultrasound pending. -Stop vancomycin, check vancomycin random level. -Nephrology to evaluate.  Diabetic foot infection -Left diabetic foot infection, was on vancomycin and Invanz. -Patient was followed with PCP and ID, Invanz continued.  Hypertension -Elevated blood pressure, patient reported this is all started after Invanz started. -Continue amlodipine, hold Lasix and lisinopril. IV hydralazine as needed.  Diabetes mellitus type 2 -Hold metformin, Lantus 10 units, SSI and are modified diet.     DVT prophylaxis:  Code Status: Full Code Family Communication:  Disposition Plan:  Diet: Diet regular Room service appropriate? Yes; Fluid consistency: Thin  Consultants:   None  Procedures:   None  Antimicrobials:   None  Objective: Vitals:   11/02/15 0132 11/02/15  0230 11/02/15 0415 11/02/15 0528  BP: 171/97 166/99 (!) 166/93 (!) 160/88  Pulse: 80 83 79 73  Resp: 16  18   Temp: 98.2 F (36.8 C)  97.6 F (36.4 C) 97.9 F (36.6 C)  TempSrc: Oral  Oral Oral  SpO2: 99% 99% 100% 100%  Weight: 103.9 kg (229 lb)  105.4 kg (232 lb 6.4 oz)   Height: 5\' 9"  (1.753 m)  5\' 9"  (1.753 m)    No intake or output data in the 24 hours ending 11/02/15 1300 Filed Weights   11/02/15 0132 11/02/15 0415  Weight: 103.9 kg (229 lb) 105.4 kg (232 lb 6.4 oz)    Examination: General exam: Appears calm and comfortable  Respiratory system: Clear to auscultation. Respiratory effort normal. Cardiovascular system: S1 & S2 heard, RRR. No JVD, murmurs, rubs, gallops or clicks. No pedal edema. Gastrointestinal system: Abdomen is nondistended, soft and nontender. No organomegaly or masses felt. Normal bowel sounds heard. Central nervous system: Alert and oriented. No focal neurological deficits. Extremities: Symmetric 5 x 5 power. Skin: No rashes, lesions or ulcers Psychiatry: Judgement and insight appear normal. Mood & affect appropriate.   Data Reviewed: I have personally reviewed following labs and imaging studies  CBC:  Recent Labs Lab 11/01/15 2144  WBC 8.6  NEUTROABS 4.9  HGB 12.6*  HCT 37.0*  MCV 85.3  PLT 123456   Basic Metabolic Panel:  Recent Labs Lab 11/01/15 2144  NA 136  K 5.0  CL 105  CO2 22  GLUCOSE 205*  BUN 35*  CREATININE 4.03*  CALCIUM 9.2   GFR: Estimated Creatinine Clearance: 29.7 mL/min (  by C-G formula based on SCr of 4.03 mg/dL (H)). Liver Function Tests:  Recent Labs Lab 11/01/15 2144  AST 28  ALT 18  ALKPHOS 69  BILITOT 1.3*  PROT 7.2  ALBUMIN 3.9   No results for input(s): LIPASE, AMYLASE in the last 168 hours. No results for input(s): AMMONIA in the last 168 hours. Coagulation Profile: No results for input(s): INR, PROTIME in the last 168 hours. Cardiac Enzymes: No results for input(s): CKTOTAL, CKMB, CKMBINDEX,  TROPONINI in the last 168 hours. BNP (last 3 results) No results for input(s): PROBNP in the last 8760 hours. HbA1C: No results for input(s): HGBA1C in the last 72 hours. CBG:  Recent Labs Lab 11/02/15 0420 11/02/15 0759  GLUCAP 186* 175*   Lipid Profile: No results for input(s): CHOL, HDL, LDLCALC, TRIG, CHOLHDL, LDLDIRECT in the last 72 hours. Thyroid Function Tests: No results for input(s): TSH, T4TOTAL, FREET4, T3FREE, THYROIDAB in the last 72 hours. Anemia Panel: No results for input(s): VITAMINB12, FOLATE, FERRITIN, TIBC, IRON, RETICCTPCT in the last 72 hours. Urine analysis:    Component Value Date/Time   COLORURINE YELLOW 10/18/2015 1934   APPEARANCEUR CLEAR 10/18/2015 1934   LABSPEC 1.021 10/18/2015 1934   PHURINE 6.5 10/18/2015 1934   GLUCOSEU NEGATIVE 10/18/2015 1934   HGBUR NEGATIVE 10/18/2015 1934   HGBUR negative 04/17/2008 0929   BILIRUBINUR NEGATIVE 10/18/2015 1934   BILIRUBINUR n 04/23/2013 1350   KETONESUR NEGATIVE 10/18/2015 1934   PROTEINUR NEGATIVE 10/18/2015 1934   UROBILINOGEN 0.2 04/23/2013 1350   UROBILINOGEN 4.0 (H) 08/09/2009 0938   NITRITE NEGATIVE 10/18/2015 1934   LEUKOCYTESUR NEGATIVE 10/18/2015 1934   Sepsis Labs: @LABRCNTIP (procalcitonin:4,lacticidven:4)  )No results found for this or any previous visit (from the past 240 hour(s)).   Invalid input(s): PROCALCITONIN, LACTICACIDVEN   Radiology Studies: Dg Chest Port 1 View  Result Date: 11/02/2015 CLINICAL DATA:  Status post PICC line placement EXAM: PORTABLE CHEST 1 VIEW COMPARISON:  10/14/2015 FINDINGS: Cardiac shadow is stable. The lungs are well aerated bilaterally. A right-sided PICC line is noted in the distal superior vena cava. The bony structures are within normal limits. IMPRESSION: PICC line in satisfactory position. Electronically Signed   By: Inez Catalina M.D.   On: 11/02/2015 07:36        Scheduled Meds: . amLODipine  10 mg Oral Daily  . collagenase  1 application  Topical Daily  . ertapenem  500 mg Intravenous Q24H  . heparin  5,000 Units Subcutaneous Q8H  . insulin aspart  0-15 Units Subcutaneous TID WC  . insulin glargine  10 Units Subcutaneous QHS  . pregabalin  25 mg Oral BID   Continuous Infusions: . sodium chloride 125 mL/hr at 11/02/15 1104     LOS: 0 days    Time spent: 35 minutes    Johnson Arizola A, MD [facility] Pager [phone]  If 7PM-7AM, please contact night-coverage [email or web address] Password [email or web address] 11/02/2015, 1:00 PM

## 2015-11-02 NOTE — Care Management Note (Addendum)
Case Management Note  Patient Details  Name: Danny Fuller MRN: [id number] Date of Birth: 28-Dec-1975  Subjective/Objective:                 Patient admitted from home w/ AKI. Receiving IV Vanc and Meropenum until 11/21/15 (Rx through Dr Megan Salon) through [facility] prior to admission for diabetic foot wound. Notified Carolynn Sayers RN [facility] liaison for infusions, of patient's admission.    Action/Plan:  Anticipate DC to home when stabilized.  11-05-15 Updated Carolynn Sayers [facility] patient tentative DC today pending nephrology input, will resume care with IV Abx (change in Rx) at DC.  11-10-15 Referrals confirm for [facility] IV Abx and HH with [facility], will DC today  Expected Discharge Date:                  Expected Discharge Plan:  [place]  In-House Referral:  NA  Discharge planning Services  CM Consult  Post Acute Care Choice:  Home Health Choice offered to:  Patient  DME Arranged:    DME Agency:     HH Arranged:  RN (IV Abx) [place] Agency:  [place]  Status of Service:  In process, will continue to follow  If discussed at Long Length of Stay Meetings, dates discussed:    Additional Comments:  Carles Collet, RN 11/02/2015, 11:01 AM

## 2015-11-02 NOTE — Progress Notes (Signed)
CRITICAL VALUE ALERT  Critical value received:  Vancomycin Rm 57   Date of notification:  11/02/2015  Time of notification: 2050  Critical value read back:Yes.    Nurse who received alert:  [place] [employer] officer  MD notified (1st page):  Baltazar Najjar, NP, No new orders placed

## 2015-11-02 NOTE — ED Provider Notes (Signed)
By signing my name below, I, Danny Fuller. Danny Fuller, attest that this documentation has been prepared under the direction and in the presence of [name], DO.  Electronically Signed: Maud Fuller. Danny Fuller, ED Scribe. 11/02/15. 1:59 AM.   TIME SEEN: 1:52 AM   CHIEF COMPLAINT:  Chief Complaint  Patient presents with  . Abnormal Lab     HPI:  HPI Comments: Danny Fuller is a 40 y.o. male with a PMHx of DM and HTN who presents to the Emergency Department here for abnomal labs this evening. Pt states he was evaluated by his home care nurse with lab work performed. Labs returned with elevated kidney function and high Vancomycin levels. He has been on Vancomycin for the past 2 weeks via PICC line for infection to the left foot. He reports 8/10 pain to the L foot. Discomfort is made worse with palpation to the area. No alleviating factors at this time. No OTC/prescribed medications attempted prior to arrival. No recent fever, chills, nausea, or vomiting. Pt also takes prescribed Invanz with last dose taken at 2:00 PM this afternoon.  Followed by Dr. Sharol Given.  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 States he is still making normal he. Denies NSAID use.  PCP: Danny Morale, MD    ROS: See HPI Constitutional: no fever  Eyes: no drainage  ENT: no runny nose   Cardiovascular:  no chest pain  Resp: no SOB  GI: no vomiting GU: no dysuria Integumentary: no rash. Positive wound Allergy: no hives  Musculoskeletal: no leg swelling. Positive arthralgias  Neurological: no slurred speech ROS otherwise negative  PAST MEDICAL HISTORY/PAST SURGICAL HISTORY:  Past Medical History:  Diagnosis Date  . Asthma   . Diabetes mellitus   . GERD (gastroesophageal reflux disease)   . Headache(784.0)   . Hypertension     MEDICATIONS:  Prior to Admission medications   Medication Sig Start Date End Date Taking? Authorizing Provider  amLODipine (NORVASC) 5 MG tablet Take 1 tablet (5 mg total) by mouth daily. 10/22/15    Danny Morale, MD  blood glucose meter kit and supplies KIT Dispense based on patient and insurance preference. Use up to four times daily as directed. (FOR ICD-9 250.00, 250.01). Patient not taking: Reported on 10/22/2015 07/29/15   Danny Reasons, MD  collagenase (SANTYL) ointment Apply 1 application topically daily. Wash left foot with soap and water daily dry the foot and then apply Santyl to a dry gauze dressing apply Ace wrap continue nonweightbearing on the left foot change dressing daily. 10/11/15   Danny Minion, MD  ertapenem 1 g in sodium chloride 0.9 % 50 mL Inject 1 g into the vein daily. 10/15/15 11/21/15  Danny Dumas, MD  furosemide (LASIX) 20 MG tablet Take 1 tablet (20 mg total) by mouth daily. 10/22/15   Danny Morale, MD  glucose blood [facility] VERIO) test strip Use to check blood sugar 1 time per day. 09/16/15   Danny Snare, MD  insulin aspart (NOVOLOG FLEXPEN) 100 UNIT/ML FlexPen Before each meal 3 times a day, 140-199 - 2 units, 200-250 - 4 units, 251-299 - 6 units,  300-349 - 8 units,  350 or above 10 units. Insulin PEN if approved, provide syringes and needles if needed. 07/29/15   Danny Reasons, MD  Insulin Glargine (LANTUS) 100 UNIT/ML Solostar Pen Inject 20 Units into the skin daily at 10 pm. 07/29/15   Danny Reasons, MD  Insulin Pen Needle (PEN NEEDLES) 31G X 6 MM MISC For  a month supply 07/29/15   Danny Reasons, MD  lactobacilus acidophilus & bulgar [facility]) TABS chewable tablet Take 1 tablet by mouth 3 (three) times daily with meals. Patient not taking: Reported on 08/16/2015 07/29/15   Danny Reasons, MD  lisinopril (PRINIVIL,ZESTRIL) 20 MG tablet Take 2 tablets (40 mg total) by mouth daily. 10/15/15   Danny Dumas, MD  metFORMIN (GLUCOPHAGE) 1000 MG tablet Take 0.5 tablets (500 mg total) by mouth 2 (two) times daily with a meal. 08/16/15   Danny Morale, MD  oxyCODONE 10 MG TABS Take 1 tablet (10 mg total) by mouth every 6 (six) hours as needed for moderate pain. 10/15/15   Danny Dumas, MD  pregabalin (LYRICA) 75 MG  capsule Take 1 capsule (75 mg total) by mouth 2 (two) times daily. 10/15/15   Danny Dumas, MD  senna-docusate (SENOKOT-S) 8.6-50 MG tablet Take 1 tablet by mouth at bedtime as needed for mild constipation. Patient not taking: Reported on 10/22/2015 10/15/15   Danny Dumas, MD  vancomycin 1,500 mg in sodium chloride 0.9 % 500 mL Inject 1,500 mg into the vein every 8 (eight) hours. 10/15/15 11/21/15  Danny Dumas, MD    ALLERGIES:  No Known Allergies  SOCIAL HISTORY:  Social History  Substance Use Topics  . Smoking status: Former Smoker    Types: Cigarettes  . Smokeless tobacco: Never Used     Comment: quit 5 days ago  . Alcohol use No    FAMILY HISTORY: Family History  Problem Relation Age of Onset  . Arthritis    . Diabetes    . Hypertension    . Hyperlipidemia    . Stroke    . Sudden death    . Diabetes Mother   . Diabetes Sister   . Diabetes Brother     EXAM: BP 171/97 (BP Location: Left Arm)   Pulse 80   Temp 98.2 F (36.8 C) (Oral)   Resp 16   Ht _0  (1.753 m)   Wt 229 lb (103.9 kg)   SpO2 99%   BMI 33.82 kg/m  CONSTITUTIONAL: Alert and oriented and responds appropriately to questions. Well-appearing; well-nourished HEAD: Normocephalic EYES: Conjunctivae clear, PERRL ENT: normal nose; no rhinorrhea; moist mucous membranes NECK: Supple, no meningismus, no LAD  CARD: RRR; S1 and S2 appreciated; no murmurs, no clicks, no rubs, no gallops RESP: Normal chest excursion without splinting or tachypnea; breath sounds clear and equal bilaterally; no wheezes, no rhonchi, no rales, no hypoxia or respiratory distress, speaking full sentences ABD/GI: Normal bowel sounds; non-distended; soft, non-tender, no rebound, no guarding, no peritoneal signs BACK:  The back appears normal and is non-tender to palpation, there is no CVA tenderness EXT: Patient has a wound to the left lateral foot with foul-smelling purulent drainage and no surrounding erythema or warmth. Status post left  fifth toe amputation. 2+ DP pulses bilaterally. Compartments are soft. No joint effusion. No subcutaneous emphysema on exam. Normal ROM in all joints; non-tender to palpation; no edema; normal capillary refill; no cyanosis, no calf tenderness or swelling    SKIN: Normal color for age and race; warm; no rash NEURO: Moves all extremities equally, sensation to light touch intact diffusely, cranial nerves II through XII intact PSYCH: The patient's mood and manner are appropriate. Grooming and personal hygiene are appropriate.  MEDICAL DECISION MAKING: Patient here with abnormal creatinine and elevated vancomycin level. Is on vancomycin and Invanz for a left foot wound. Previously had a left fifth toe amputation by Dr.  Sharol Given on June 12. Was found to have creatinine of 4 today. Creatinine is usually 0.8. Vancomycin level also significantly elevated. Will avoid nephrotoxic medications, give IV fluids. We'll discuss with medicine for admission. He is still making urine. Potassium is normal. No other complaints.  ED PROGRESS:    2:20 AM Discussed patient's case with Hospitalist, Dr. Alcario Drought.  Recommend admission to medical, inpatient bed.  I will place holding orders per their request. Patient and family (if present) updated with plan. Care transferred to hospitalist service.  I reviewed all nursing notes, vitals, pertinent old records, EKGs, labs, imaging (as available).  I personally performed the services described in this documentation, which was scribed in my presence. The recorded information has been reviewed and is accurate.    [place], DO 11/02/15 [phone]

## 2015-11-03 LAB — VANCOMYCIN, RANDOM: Vancomycin Rm: 45

## 2015-11-03 LAB — GLUCOSE, CAPILLARY
GLUCOSE-CAPILLARY: 144 mg/dL — AB (ref 65–99)
Glucose-Capillary: 149 mg/dL — ABNORMAL HIGH (ref 65–99)
Glucose-Capillary: 221 mg/dL — ABNORMAL HIGH (ref 65–99)
Glucose-Capillary: 102 mg/dL — ABNORMAL HIGH (ref 65–99)

## 2015-11-03 LAB — CBC
HCT: 33.3 % — ABNORMAL LOW (ref 39.0–52.0)
Hemoglobin: 11.2 g/dL — ABNORMAL LOW (ref 13.0–17.0)
MCH: 28.9 pg (ref 26.0–34.0)
MCHC: 33.6 g/dL (ref 30.0–36.0)
MCV: 85.8 fL (ref 78.0–100.0)
Platelets: 266 10*3/uL (ref 150–400)
RBC: 3.88 MIL/uL — ABNORMAL LOW (ref 4.22–5.81)
RDW: 13.1 % (ref 11.5–15.5)
WBC: 5.4 10*3/uL (ref 4.0–10.5)

## 2015-11-03 LAB — RENAL FUNCTION PANEL
ALBUMIN: 3.2 g/dL — AB (ref 3.5–5.0)
Anion gap: 4 — ABNORMAL LOW (ref 5–15)
BUN: 38 mg/dL — AB (ref 6–20)
CALCIUM: 8.8 mg/dL — AB (ref 8.9–10.3)
CO2: 24 mmol/L (ref 22–32)
Chloride: 109 mmol/L (ref 101–111)
Creatinine, Ser: 3.82 mg/dL — ABNORMAL HIGH (ref 0.61–1.24)
GFR calc Af Amer: 22 mL/min — ABNORMAL LOW (ref >60)
GFR calc non Af Amer: 19 mL/min — ABNORMAL LOW (ref >60)
GLUCOSE: 177 mg/dL — AB (ref 65–99)
PHOSPHORUS: 5.1 mg/dL — AB (ref 2.5–4.6)
Potassium: 4.2 mmol/L (ref 3.5–5.1)
SODIUM: 137 mmol/L (ref 135–145)

## 2015-11-03 MED ORDER — HYDRALAZINE HCL 25 MG PO TABS
25.0000 mg | ORAL_TABLET | Freq: Three times a day (TID) | ORAL | Status: DC
  Administered 2015-11-03 – 2015-11-10 (×20): 25 mg via ORAL
  Filled 2015-11-03 (×21): qty 1

## 2015-11-03 NOTE — Progress Notes (Signed)
PROGRESS NOTE  Danny Fuller  [id number] DOB: [birth year] DOA: 11/02/2015 PCP: [name], MD   Brief Narrative:  Danny Fuller is a 40 y.o. 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 Sharol Given), HTN, GERD.  Patient was discharged on 9/1 and was to complete a 6 week course of invanz and vanc (see Dr. Hale Bogus last progress note).  He was sent by his PCP (Dr. Sarajane Jews) due to lab work drawn on the morning of admission which showed creat of 3.3 and vanc trough of 61.  Clinically he is asymptomatic from a renal standpoint. Nephrology was consulted.  Assessment & Plan:   Principal Problem:   ARF (acute renal failure) (HCC) Active Problems:   Type 2 diabetes mellitus with diabetic neuropathy, with long-term current use of insulin (HCC)   Essential hypertension   Diabetic foot infection ([place])   AKI -Baseline creatinine 0.8 from 10/18/2015 came in with creatinine of 4.0. -Reportedly vancomycin trough was 61 from outpatient records. Patient also on lisinopril 40 mg. -UA on 11/02/15: Appears bland. Renal ultrasound 9/19 without acute abnormality or hydronephrosis. - Nephrology consultation and follow-up appreciated. Acute kidney injury presumed related to vancomycin toxicity possibly compounded by use of lisinopril. Patient states that he had not yet started Lasix. - Continue IV fluids. Creatinine starting to gradually decrease. Follow BMP -Stopped vancomycin, check vancomycin random level > decreasing 57>45.  Diabetic foot infection -Left diabetic foot infection, was on vancomycin and Invanz. -Patient was followed with PCP and ID, Invanz continued. Vancomycin DC'ed d/t AKI - Discussed with Dr. Michel Bickers, ID on 11/03/2015> MSSA and Klebsiella from left foot wound> recommends completing IV ertapenem through 11/21/15 and no need to recommence vancomycin or change to other  antibiotics.  Hypertension -Elevated blood pressure, patient reported this is all started after Invanz started. -Continue amlodipine, hold Lasix and lisinopril. IV hydralazine as needed. Reasonable inpatient control.  Diabetes mellitus type 2 -Hold metformin, Lantus 10 units, SSI and are modified diet. Fluctuating CBGs. Monitor closely.  Anemia - Follow CBCs   DVT prophylaxis: Heparin Code Status: Full Code Family Communication: None at bedside Disposition Plan: DC home, possibly in the next 1-2 days pending improvement in renal functions.  Consultants:   Nephrology  Procedures:   None  Antimicrobials:   None  Subjective - Patient denies complaints. Denies pain in his left foot wound. States that the wound has progressively improved. Urinating well. No dyspnea.  Objective: Vitals:   11/02/15 0415 11/02/15 0528 11/02/15 1701 11/03/15 0704  BP: (!) 166/93 (!) 160/88 (!) 158/86 (!) 158/95  Pulse: 79 73 78 80  Resp: 18  18 20   Temp: 97.6 F (36.4 C) 97.9 F (36.6 C) 98.2 F (36.8 C) 97.6 F (36.4 C)  TempSrc: Oral Oral Oral Oral  SpO2: 100% 100% 100% 99%  Weight: 105.4 kg (232 lb 6.4 oz)     Height: 5\' 9"  (1.753 m)       Intake/Output Summary (Last 24 hours) at 11/03/15 1628 Last data filed at 11/03/15 1542  Gross per 24 hour  Intake          4530.41 ml  Output             3050 ml  Net          1480.41 ml   Filed Weights   11/02/15 0132 11/02/15 0415  Weight: 103.9 kg (229 lb) 105.4 kg (232 lb  6.4 oz)    Examination: General exam: Appears calm and comfortable  Respiratory system: Clear to auscultation. Respiratory effort normal. Cardiovascular system: S1 & S2 heard, RRR. No JVD, murmurs, rubs, gallops or clicks. No pedal edema. Gastrointestinal system: Abdomen is nondistended, soft and nontender. No organomegaly or masses felt. Normal bowel sounds heard. Central nervous system: Alert and oriented. No focal neurological deficits. Extremities: Symmetric  5 x 5 power. Skin: Left lateral foot wound without acute findings. Seems to be healing well. Psychiatry: Judgement and insight appear normal. Mood & affect appropriate.   Data Reviewed: I have personally reviewed following labs and imaging studies  CBC:  Recent Labs Lab 11/01/15 2144 11/03/15 0500  WBC 8.6 5.4  NEUTROABS 4.9  --   HGB 12.6* 11.2*  HCT 37.0* 33.3*  MCV 85.3 85.8  PLT 273 123456   Basic Metabolic Panel:  Recent Labs Lab 11/01/15 2144 11/03/15 0500  NA 136 137  K 5.0 4.2  CL 105 109  CO2 22 24  GLUCOSE 205* 177*  BUN 35* 38*  CREATININE 4.03* 3.82*  CALCIUM 9.2 8.8*  PHOS  --  5.1*   GFR: Estimated Creatinine Clearance: 31.4 mL/min (by C-G formula based on SCr of 3.82 mg/dL (H)). Liver Function Tests:  Recent Labs Lab 11/01/15 2144 11/03/15 0500  AST 28  --   ALT 18  --   ALKPHOS 69  --   BILITOT 1.3*  --   PROT 7.2  --   ALBUMIN 3.9 3.2*   No results for input(s): LIPASE, AMYLASE in the last 168 hours. No results for input(s): AMMONIA in the last 168 hours. Coagulation Profile: No results for input(s): INR, PROTIME in the last 168 hours. Cardiac Enzymes: No results for input(s): CKTOTAL, CKMB, CKMBINDEX, TROPONINI in the last 168 hours. BNP (last 3 results) No results for input(s): PROBNP in the last 8760 hours. HbA1C: No results for input(s): HGBA1C in the last 72 hours. CBG:  Recent Labs Lab 11/02/15 1219 11/02/15 1720 11/02/15 2100 11/03/15 0825 11/03/15 1157  GLUCAP 154* 138* 192* 149* 221*   Lipid Profile: No results for input(s): CHOL, HDL, LDLCALC, TRIG, CHOLHDL, LDLDIRECT in the last 72 hours. Thyroid Function Tests: No results for input(s): TSH, T4TOTAL, FREET4, T3FREE, THYROIDAB in the last 72 hours. Anemia Panel: No results for input(s): VITAMINB12, FOLATE, FERRITIN, TIBC, IRON, RETICCTPCT in the last 72 hours. Urine analysis:    Component Value Date/Time   COLORURINE YELLOW 11/02/2015 1512   APPEARANCEUR CLEAR  11/02/2015 1512   LABSPEC 1.015 11/02/2015 1512   PHURINE 5.0 11/02/2015 1512   GLUCOSEU NEGATIVE 11/02/2015 1512   HGBUR NEGATIVE 11/02/2015 1512   HGBUR negative 04/17/2008 0929   BILIRUBINUR NEGATIVE 11/02/2015 1512   BILIRUBINUR n 04/23/2013 1350   KETONESUR NEGATIVE 11/02/2015 1512   PROTEINUR NEGATIVE 11/02/2015 1512   UROBILINOGEN 0.2 04/23/2013 1350   UROBILINOGEN 4.0 (H) 08/09/2009 0938   NITRITE NEGATIVE 11/02/2015 1512   LEUKOCYTESUR NEGATIVE 11/02/2015 1512    Radiology Studies: US Renal  Result Date: 11/02/2015 CLINICAL DATA:  Acute kidney injury. EXAM: RENAL / URINARY TRACT ULTRASOUND COMPLETE COMPARISON:  None. FINDINGS: Right Kidney: Length: 14.3 cm. Echogenicity within normal limits. No mass or hydronephrosis visualized. Left Kidney: Length: 15.1 cm. Normal echogenicity without hydronephrosis. Anechoic cyst in the left kidney lower pole measures up to 2.5 cm with acoustic enhancement. Bladder: Appears normal for degree of bladder distention. IMPRESSION: No acute abnormality.  No hydronephrosis. Left renal cyst. Electronically Signed   By: Quita Skye  Anselm Pancoast M.D.   On: 11/02/2015 17:00   Dg Chest Port 1 View  Result Date: 11/02/2015 CLINICAL DATA:  Status post PICC line placement EXAM: PORTABLE CHEST 1 VIEW COMPARISON:  10/14/2015 FINDINGS: Cardiac shadow is stable. The lungs are well aerated bilaterally. A right-sided PICC line is noted in the distal superior vena cava. The bony structures are within normal limits. IMPRESSION: PICC line in satisfactory position. Electronically Signed   By: Inez Catalina M.D.   On: 11/02/2015 07:36        Scheduled Meds: . amLODipine  10 mg Oral Daily  . collagenase  1 application Topical Daily  . ertapenem  500 mg Intravenous Q24H  . heparin  5,000 Units Subcutaneous Q8H  . hydrALAZINE  25 mg Oral Q8H  . insulin aspart  0-15 Units Subcutaneous TID WC  . insulin glargine  10 Units Subcutaneous QHS  . pregabalin  25 mg Oral BID    Continuous Infusions: . sodium chloride 125 mL/hr at 11/03/15 1026     LOS: 1 day    Time spent: 51 minutes    Brielynn Sekula, MD, FACP, FHM. [facility] Pager [phone]  If 7PM-7AM, please contact night-coverage [email or web address] Password [facility] 11/03/2015, 4:43 PM

## 2015-11-03 NOTE — Progress Notes (Signed)
[facility] Rounding Note  Subjective/Interval History:  No new events Good UOP On IVF's  Objective  Vitals:   11/02/15 0415 11/02/15 0528 11/02/15 1701 11/03/15 0704  BP: (!) 166/93 (!) 160/88 (!) 158/86 (!) 158/95  Pulse: 79 73 78 80  Resp: 18  18 20   Temp: 97.6 F (36.4 C) 97.9 F (36.6 C) 98.2 F (36.8 C) 97.6 F (36.4 C)  TempSrc: Oral Oral Oral Oral  SpO2: 100% 100% 100% 99%  Weight: 105.4 kg (232 lb 6.4 oz)     Height: 5\' 9"  (1.753 m)      Weight change:   Intake/Output Summary (Last 24 hours) at 11/03/15 1212 Last data filed at 11/03/15 1108  Gross per 24 hour  Intake          4678.32 ml  Output             2400 ml  Net          2278.32 ml   Physical Exam:  Blood pressure (!) 158/95, pulse 80, temperature 97.6 F (36.4 C), temperature source Oral, resp. rate 20, height 5\' 9"  (1.753 m), weight 105.4 kg (232 lb 6.4 oz), SpO2 99 %.  Gen: Very nice soft spoken AAM VS as noted Lines/tubes: PICC line RU arm No rash, cyanosis No JVD Lungs clear Cardiac rhythm regular, S1S2 normal. No S3 or S4. no rub or gallop Abdomen soft, + BS, no focal abdominal tenderness 1-2+ edema LLE, trace edema RLE L foot not unwrapped Alert, Ox3, no focal deficit   Labs:   Recent Labs Lab 11/01/15 2144 11/03/15 0500  NA 136 137  K 5.0 4.2  CL 105 109  CO2 22 24  GLUCOSE 205* 177*  BUN 35* 38*  CREATININE 4.03* 3.82*  CALCIUM 9.2 8.8*  PHOS  --  5.1*     Recent Labs Lab 11/01/15 2144 11/03/15 0500  AST 28  --   ALT 18  --   ALKPHOS 69  --   BILITOT 1.3*  --   PROT 7.2  --   ALBUMIN 3.9 3.2*     Recent Labs Lab 11/01/15 2144 11/03/15 0500  WBC 8.6 5.4  NEUTROABS 4.9  --   HGB 12.6* 11.2*  HCT 37.0* 33.3*  MCV 85.3 85.8  PLT 273 266     Recent Labs Lab 11/02/15 1219 11/02/15 1720 11/02/15 2100 11/03/15 0825 11/03/15 1157  GLUCAP 154* 138* 192* 149* 221*   Results for LAVONTE, LU (MRN [id number]) as of 11/02/2015 16:26  11/02/2015 15:12   Appearance CLEAR  Bilirubin Urine NEGATIVE  Color, Urine YELLOW  Glucose NEGATIVE  Hgb urine dipstick NEGATIVE  Ketones, ur NEGATIVE  Leukocytes, UA NEGATIVE  Nitrite NEGATIVE  pH 5.0  Protein NEGATIVE  Specific Gravity, Urine 1.015   Results for BRAYLN, KELLISON (MRN [id number]) as of 11/03/2015 12:15   11/02/2015 19:16 11/03/2015 05:00  Vancomycin Rm 57 (HH) 45    Studies/Results: US Renal  Result Date: 11/02/2015 CLINICAL DATA:  Acute kidney injury. EXAM: RENAL / URINARY TRACT ULTRASOUND COMPLETE COMPARISON:  None. FINDINGS: Right Kidney: Length: 14.3 cm. Echogenicity within normal limits. No mass or hydronephrosis visualized. Left Kidney: Length: 15.1 cm. Normal echogenicity without hydronephrosis. Anechoic cyst in the left kidney lower pole measures up to 2.5 cm with acoustic enhancement. Bladder: Appears normal for degree of bladder distention. IMPRESSION: No acute abnormality.  No hydronephrosis. Left renal cyst. Electronically Signed   By: Markus Daft M.D.   On: 11/02/2015 17:00  Dg Chest Port 1 View  Result Date: 11/02/2015 CLINICAL DATA:  Status post PICC line placement EXAM: PORTABLE CHEST 1 VIEW COMPARISON:  10/14/2015 FINDINGS: Cardiac shadow is stable. The lungs are well aerated bilaterally. A right-sided PICC line is noted in the distal superior vena cava. The bony structures are within normal limits. IMPRESSION: PICC line in satisfactory position. Electronically Signed   By: Inez Catalina M.D.   On: 11/02/2015 07:36   Medications: . sodium chloride 125 mL/hr at 11/03/15 1026   . amLODipine  10 mg Oral Daily  . collagenase  1 application Topical Daily  . ertapenem  500 mg Intravenous Q24H  . heparin  5,000 Units Subcutaneous Q8H  . insulin aspart  0-15 Units Subcutaneous TID WC  . insulin glargine  10 Units Subcutaneous QHS  . pregabalin  25 mg Oral BID    I  have reviewed scheduled and prn medications.  Background:  41 y.o. with  PMH of DM2, HTN, GERD,  osteomyelitis of the left foot (ray amputation/abscess drainage 07/26/15 with cx + group B strep and MRSE --> failed oral ATB,s and admitted  8/27-9/1 with a wound infection and MRI confirming osteo. Cultures + S aureus and klebsiella ornitholytica.  He was started on vanco and zosyn, then changed to vanco/ertapenem to complete a course 11/21/15.   He was called to come into the hospital 9/18 because of labs rec'd showing a creatinine of 3.3 and a vanco trough of 61 (previous labs - don't know what date - showed creatinine of 0.8 and vanco trough of 15. On 10/14/15 trough was 19).  On admission creatinine was 4 and we were asked to see.    Assessment 1. Acute kidney injury presumed related to vancomycin toxicity, possible compounded by use of lisinopril and lasix. Urinalysis entirely negative. Non-oliguric. Creatinine has already plateaued and may be starting to trend down. Pharmacy estimates current t/1/2 of vanco to be around 24 hours.  2. Diabetic foot ulcer with osteomyelitis (staph aureus, Klebsiella) 3. DM2 - lantus. 4. HTN  - on amlodipine/lisinopril/furosemide PTA - lisinopril on hold. BP elevated.  Recommendations 1. Continue fluids and lab trending 2. ATB recommendations for continued Rx of osteo per ID 3. Holding  metformin in face of AKI 4. DM mgmt per primary service 5. Agree with amlodipine and would add po hydralazine for BP (ordered 25 TID to start). No diuretics for now.     Jamal Maes, MD [facility] [phone] pager 11/03/2015, 12:12 PM

## 2015-11-04 LAB — URINALYSIS, ROUTINE W REFLEX MICROSCOPIC
Bilirubin Urine: NEGATIVE
GLUCOSE, UA: NEGATIVE mg/dL
HGB URINE DIPSTICK: NEGATIVE
KETONES UR: NEGATIVE mg/dL
LEUKOCYTES UA: NEGATIVE
NITRITE: NEGATIVE
PH: 5.5 (ref 5.0–8.0)
PROTEIN: NEGATIVE mg/dL
Specific Gravity, Urine: 1.008 (ref 1.005–1.030)

## 2015-11-04 LAB — RENAL FUNCTION PANEL
Albumin: 3 g/dL — ABNORMAL LOW (ref 3.5–5.0)
Anion gap: 7 (ref 5–15)
BUN: 36 mg/dL — ABNORMAL HIGH (ref 6–20)
CHLORIDE: 107 mmol/L (ref 101–111)
CO2: 23 mmol/L (ref 22–32)
Calcium: 8.7 mg/dL — ABNORMAL LOW (ref 8.9–10.3)
Creatinine, Ser: 3.55 mg/dL — ABNORMAL HIGH (ref 0.61–1.24)
GFR, EST AFRICAN AMERICAN: 24 mL/min — AB (ref >60)
GFR, EST NON AFRICAN AMERICAN: 20 mL/min — AB (ref >60)
Glucose, Bld: 172 mg/dL — ABNORMAL HIGH (ref 65–99)
POTASSIUM: 4 mmol/L (ref 3.5–5.1)
Phosphorus: 5.2 mg/dL — ABNORMAL HIGH (ref 2.5–4.6)
Sodium: 137 mmol/L (ref 135–145)

## 2015-11-04 LAB — CBC
HEMATOCRIT: 32.8 % — AB (ref 39.0–52.0)
Hemoglobin: 10.9 g/dL — ABNORMAL LOW (ref 13.0–17.0)
MCH: 28.5 pg (ref 26.0–34.0)
MCHC: 33.2 g/dL (ref 30.0–36.0)
MCV: 85.6 fL (ref 78.0–100.0)
PLATELETS: 265 10*3/uL (ref 150–400)
RBC: 3.83 MIL/uL — AB (ref 4.22–5.81)
RDW: 13 % (ref 11.5–15.5)
WBC: 5 10*3/uL (ref 4.0–10.5)

## 2015-11-04 LAB — GLUCOSE, CAPILLARY
GLUCOSE-CAPILLARY: 109 mg/dL — AB (ref 65–99)
GLUCOSE-CAPILLARY: 138 mg/dL — AB (ref 65–99)
Glucose-Capillary: 154 mg/dL — ABNORMAL HIGH (ref 65–99)
Glucose-Capillary: 128 mg/dL — ABNORMAL HIGH (ref 65–99)

## 2015-11-04 MED ORDER — PREGABALIN 75 MG PO CAPS
75.0000 mg | ORAL_CAPSULE | Freq: Two times a day (BID) | ORAL | Status: DC
  Administered 2015-11-04 – 2015-11-08 (×10): 75 mg via ORAL
  Filled 2015-11-04 (×10): qty 1

## 2015-11-04 MED ORDER — COLLAGENASE 250 UNIT/GM EX OINT
TOPICAL_OINTMENT | Freq: Every day | CUTANEOUS | Status: DC
  Administered 2015-11-04 – 2015-11-05 (×2): via TOPICAL
  Administered 2015-11-06 – 2015-11-07 (×2): 1 via TOPICAL
  Administered 2015-11-08: 09:00:00 via TOPICAL
  Administered 2015-11-09: 1 via TOPICAL
  Administered 2015-11-10: 10:00:00 via TOPICAL
  Filled 2015-11-04: qty 30

## 2015-11-04 MED ORDER — SODIUM CHLORIDE 0.9 % IV SOLN
1.0000 g | INTRAVENOUS | Status: DC
  Administered 2015-11-04 – 2015-11-08 (×5): 1 g via INTRAVENOUS
  Filled 2015-11-04 (×6): qty 1

## 2015-11-04 NOTE — Progress Notes (Addendum)
[facility] Rounding Note  Subjective/Interval History:   Excellent UOP (on IVF at 125/hour) Creatinine slowly falling  Objective  Vitals:   11/02/15 1701 11/03/15 0704 11/03/15 2105 11/04/15 0612  BP: (!) 158/86 (!) 158/95 (!) 183/96 (!) 161/93  Pulse: 78 80 84 82  Resp: 18 20 20 18   Temp: 98.2 F (36.8 C) 97.6 F (36.4 C) 97.8 F (36.6 C) 98.1 F (36.7 C)  TempSrc: Oral Oral Oral Oral  SpO2: 100% 99% 100% 99%  Weight:      Height:       Weight change:   Intake/Output Summary (Last 24 hours) at 11/04/15 1231 Last data filed at 11/04/15 1206  Gross per 24 hour  Intake          2560.83 ml  Output             3750 ml  Net         -1189.17 ml   Physical Exam:  Blood pressure (!) 161/93, pulse 82, temperature 98.1 F (36.7 C), temperature source Oral, resp. rate 18, height 5\' 9"  (1.753 m), weight 105.4 kg (232 lb 6.4 oz), SpO2 99 %.  Gen: Very nice soft spoken AAM VS as noted PICC line RU arm No JVD Lungs clear Cardiac rhythm regular, S1S2 normal. No S3 or S4. no rub or gallop Abdomen soft, + BS, no focal abdominal tenderness 1-2+ edema LLE, no RLE edema L foot not unwrapped Alert, Ox3, no focal deficit  Labs:  Recent Labs Lab 11/01/15 2144 11/03/15 0500 11/04/15 0547  NA 136 137 137  K 5.0 4.2 4.0  CL 105 109 107  CO2 22 24 23   GLUCOSE 205* 177* 172*  BUN 35* 38* 36*  CREATININE 4.03* 3.82* 3.55*  CALCIUM 9.2 8.8* 8.7*  PHOS  --  5.1* 5.2*     Recent Labs Lab 11/01/15 2144 11/03/15 0500 11/04/15 0547  AST 28  --   --   ALT 18  --   --   ALKPHOS 69  --   --   BILITOT 1.3*  --   --   PROT 7.2  --   --   ALBUMIN 3.9 3.2* 3.0*     Recent Labs Lab 11/01/15 2144 11/03/15 0500 11/04/15 0500  WBC 8.6 5.4 5.0  NEUTROABS 4.9  --   --   HGB 12.6* 11.2* 10.9*  HCT 37.0* 33.3* 32.8*  MCV 85.3 85.8 85.6  PLT 273 266 265     Recent Labs Lab 11/03/15 1157 11/03/15 1702 11/03/15 2104 11/04/15 0831 11/04/15 1205  GLUCAP 221* 102* 144* 154* 128*    Results for Danny, Fuller (MRN [id number]) as of 11/02/2015 16:26  11/02/2015 15:12  Appearance CLEAR  Bilirubin Urine NEGATIVE  Color, Urine YELLOW  Glucose NEGATIVE  Hgb urine dipstick NEGATIVE  Ketones, ur NEGATIVE  Leukocytes, UA NEGATIVE  Nitrite NEGATIVE  pH 5.0  Protein NEGATIVE  Specific Gravity, Urine 1.015   Results for Danny, Fuller (MRN [id number]) as of 11/03/2015 12:15   11/02/2015 19:16 11/03/2015 05:00  Vancomycin Rm 57 (HH) 45    Studies/Results: US Renal  Result Date: 11/02/2015 CLINICAL DATA:  Acute kidney injury. EXAM: RENAL / URINARY TRACT ULTRASOUND COMPLETE COMPARISON:  None. FINDINGS: Right Kidney: Length: 14.3 cm. Echogenicity within normal limits. No mass or hydronephrosis visualized. Left Kidney: Length: 15.1 cm. Normal echogenicity without hydronephrosis. Anechoic cyst in the left kidney lower pole measures up to 2.5 cm with acoustic enhancement. Bladder: Appears normal for degree of  bladder distention. IMPRESSION: No acute abnormality.  No hydronephrosis. Left renal cyst. Electronically Signed   By: Markus Daft M.D.   On: 11/02/2015 17:00   Medications: . sodium chloride 125 mL/hr at 11/04/15 0905   . amLODipine  10 mg Oral Daily  . collagenase   Topical Daily  . ertapenem  1 g Intravenous Q24H  . heparin  5,000 Units Subcutaneous Q8H  . hydrALAZINE  25 mg Oral Q8H  . insulin aspart  0-15 Units Subcutaneous TID WC  . insulin glargine  10 Units Subcutaneous QHS  . pregabalin  75 mg Oral BID    I  have reviewed scheduled and prn medications.  Background:  40 y.o. with  PMH of DM2, HTN, GERD, osteomyelitis of the left foot (ray amputation/abscess drainage 07/26/15 with cx + group B strep and MRSE --> failed oral ATB,s and admitted  8/27-9/1 with a wound infection and MRI confirming osteo. Cultures + S aureus and klebsiella ornitholytica.  He was started on vanco and zosyn, then changed to vanco/ertapenem to complete a course 11/21/15.   He was  called to come into the hospital 9/18 because of labs rec'd showing a creatinine of 3.3 and a vanco trough of 61 (previous labs - don't know what date - showed creatinine of 0.8 and vanco trough of 15. On 10/14/15 trough was 19).  On admission creatinine was 4 and we were asked to see.    Assessment  1. Acute kidney injury presumed related to vancomycin toxicity, possible compounded by use of lisinopril and lasix. Urinalysis entirely negative. Non-oliguric. Creatinine is now trending down. Pharmacy estimates current t/1/2 of vanco to be around 24 hours.  2. Diabetic foot ulcer with osteomyelitis (staph aureus, Klebsiella) 3. DM2 - lantus. 4. HTN  - on amlodipine/lisinopril/furosemide PTA - lisinopril on hold.  Recommendations  1. Continue fluids and lab trending - I think can slow down IVF rate to 75, stop them tomorrow. 2. If creatinine continues to fall agree could leave tomorrow and [facility] could draw labs to follow renal function. 3. ATB recommendations for continued Rx of osteo per ID (no plans for restarting vanco or other gm+ coverage) 4. Holding  metformin in face of AKI 5. DM mgmt per primary service 6. Agree with amlodipine and would add po hydralazine for BP (ordered 25 TID to start). No diuretics for now.    Jamal Maes, MD [facility] [phone] pager 11/04/2015, 12:31 PM

## 2015-11-04 NOTE — Progress Notes (Signed)
PROGRESS NOTE  Danny Fuller  [id number] DOB: [birth year] DOA: 11/02/2015 PCP: [name], MD   Brief Narrative:  Danny Fuller is a 40 y.o. 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 Sharol Given), HTN, GERD.  Patient was discharged on 9/1 and was to complete a 6 week course of invanz and vanc (see Dr. Hale Bogus last progress note).  He was sent by his PCP (Dr. Sarajane Jews) due to lab work drawn on the morning of admission which showed creat of 3.3 and vanc trough of 61.  Clinically he is asymptomatic from a renal standpoint. Nephrology was consulted.  Assessment & Plan:   Principal Problem:   ARF (acute renal failure) (HCC) Active Problems:   Type 2 diabetes mellitus with diabetic neuropathy, with long-term current use of insulin (HCC)   Essential hypertension   Diabetic foot infection (Danny Fuller)   AKI -Baseline creatinine 0.8 from 10/18/2015 came in with creatinine of 4.0. -Reportedly vancomycin trough was 61 from outpatient records. Patient also on lisinopril 40 mg. -UA on 11/02/15: Appears bland. Renal ultrasound 9/19 without acute abnormality or hydronephrosis. - Nephrology consultation and follow-up appreciated. Acute kidney injury presumed related to vancomycin toxicity possibly compounded by use of lisinopril. Patient states that he had not yet started Lasix. - Continue IV fluids. Creatinine starting to gradually decrease. 4.03 > 3.8 to >3.55 -Stopped vancomycin, check vancomycin random level > decreasing 57>45. - Possibly continue IV fluids for additional 24 hours, if creatinine continues to improve then DC home 11/05/15.  Diabetic foot infection -Left diabetic foot infection, was on vancomycin and Invanz. -Patient was followed with PCP and ID, Invanz continued. Vancomycin DC'ed d/t AKI - Discussed with Dr. Michel Bickers, ID on 11/03/2015> MSSA and Klebsiella from left foot wound>  recommends completing IV ertapenem through 11/21/15 and no need to recommence vancomycin or change to other antibiotics. - - Wound care consultation appreciated and recommendations as below:  Wound type: Diabetic Foot Ulcer  Measurement: 9 cm x 5 cm Wound bed: 50% yellow/bown slough; 50% pink granulation tissue Drainage (amount, consistency, odor) Scant yellow, serosanginous; no odor Periwound: Intact Dressing procedure/placement/frequency: Santyl, gauze per Santyl order   Hypertension -Elevated blood pressure, patient reported this is all started after Invanz started. -Continue amlodipine, hold Lasix and lisinopril. IV hydralazine as needed. Hydralazine added  Diabetes mellitus type 2 -Hold metformin, Lantus 10 units, SSI and are modified diet. Fluctuating CBGs. Monitor closely.  Anemia - Stable   DVT prophylaxis: Heparin Code Status: Full Code Family Communication: None at bedside Disposition Plan: DC home, possibly in the next 24 hours pending improvement in renal functions.  Consultants:   Nephrology  Procedures:   None  Antimicrobials:   None  Subjective - Feels tired. Did not sleep well last night. No other complaints reported.   Objective: Vitals:   11/02/15 1701 11/03/15 0704 11/03/15 2105 11/04/15 0612  BP: (!) 158/86 (!) 158/95 (!) 183/96 (!) 161/93  Pulse: 78 80 84 82  Resp: 18 20 20 18   Temp: 98.2 F (36.8 C) 97.6 F (36.4 C) 97.8 F (36.6 C) 98.1 F (36.7 C)  TempSrc: Oral Oral Oral Oral  SpO2: 100% 99% 100% 99%  Weight:      Height:        Intake/Output Summary (Last 24 hours) at 11/04/15 1221 Last data filed at 11/04/15 1206  Gross per 24 hour  Intake  2560.83 ml  Output             3750 ml  Net         -1189.17 ml   Filed Weights   11/02/15 0132 11/02/15 0415  Weight: 103.9 kg (229 lb) 105.4 kg (232 lb 6.4 oz)    Examination: General exam: Appears calm and comfortable  Respiratory system: Clear to auscultation.  Respiratory effort normal. Cardiovascular system: S1 & S2 heard, RRR. No JVD, murmurs, rubs, gallops or clicks. No pedal edema. Gastrointestinal system: Abdomen is nondistended, soft and nontender. No organomegaly or masses felt. Normal bowel sounds heard. Central nervous system: Alert and oriented. No focal neurological deficits. Extremities: Symmetric 5 x 5 power. Skin: Left lateral foot wound without acute findings. Seems to be healing well. Psychiatry: Judgement and insight appear normal. Mood & affect appropriate.   Data Reviewed: I have personally reviewed following labs and imaging studies  CBC:  Recent Labs Lab 11/01/15 2144 11/03/15 0500 11/04/15 0500  WBC 8.6 5.4 5.0  NEUTROABS 4.9  --   --   HGB 12.6* 11.2* 10.9*  HCT 37.0* 33.3* 32.8*  MCV 85.3 85.8 85.6  PLT 273 266 [id number]   Basic Metabolic Panel:  Recent Labs Lab 11/01/15 2144 11/03/15 0500 11/04/15 0547  NA 136 137 137  K 5.0 4.2 4.0  CL 105 109 107  CO2 22 24 23   GLUCOSE 205* 177* 172*  BUN 35* 38* 36*  CREATININE 4.03* 3.82* 3.55*  CALCIUM 9.2 8.8* 8.7*  PHOS  --  5.1* 5.2*   GFR: Estimated Creatinine Clearance: 33.8 mL/min (by C-G formula based on SCr of 3.55 mg/dL (H)). Liver Function Tests:  Recent Labs Lab 11/01/15 2144 11/03/15 0500 11/04/15 0547  AST 28  --   --   ALT 18  --   --   ALKPHOS 69  --   --   BILITOT 1.3*  --   --   PROT 7.2  --   --   ALBUMIN 3.9 3.2* 3.0*   No results for input(s): LIPASE, AMYLASE in the last 168 hours. No results for input(s): AMMONIA in the last 168 hours. Coagulation Profile: No results for input(s): INR, PROTIME in the last 168 hours. Cardiac Enzymes: No results for input(s): CKTOTAL, CKMB, CKMBINDEX, TROPONINI in the last 168 hours. BNP (last 3 results) No results for input(s): PROBNP in the last 8760 hours. HbA1C: No results for input(s): HGBA1C in the last 72 hours. CBG:  Recent Labs Lab 11/03/15 1157 11/03/15 1702 11/03/15 2104  11/04/15 0831 11/04/15 1205  GLUCAP 221* 102* 144* 154* 128*   Lipid Profile: No results for input(s): CHOL, HDL, LDLCALC, TRIG, CHOLHDL, LDLDIRECT in the last 72 hours. Thyroid Function Tests: No results for input(s): TSH, T4TOTAL, FREET4, T3FREE, THYROIDAB in the last 72 hours. Anemia Panel: No results for input(s): VITAMINB12, FOLATE, FERRITIN, TIBC, IRON, RETICCTPCT in the last 72 hours. Urine analysis:    Component Value Date/Time   COLORURINE YELLOW 11/04/2015 0100   APPEARANCEUR CLEAR 11/04/2015 0100   LABSPEC 1.008 11/04/2015 0100   PHURINE 5.5 11/04/2015 0100   GLUCOSEU NEGATIVE 11/04/2015 0100   HGBUR NEGATIVE 11/04/2015 0100   HGBUR negative 04/17/2008 0929   BILIRUBINUR NEGATIVE 11/04/2015 0100   BILIRUBINUR n 04/23/2013 1350   KETONESUR NEGATIVE 11/04/2015 0100   PROTEINUR NEGATIVE 11/04/2015 0100   UROBILINOGEN 0.2 04/23/2013 1350   UROBILINOGEN 4.0 (H) 08/09/2009 0938   NITRITE NEGATIVE 11/04/2015 0100   LEUKOCYTESUR NEGATIVE 11/04/2015 0100  Radiology Studies: US Renal  Result Date: 11/02/2015 CLINICAL DATA:  Acute kidney injury. EXAM: RENAL / URINARY TRACT ULTRASOUND COMPLETE COMPARISON:  None. FINDINGS: Right Kidney: Length: 14.3 cm. Echogenicity within normal limits. No mass or hydronephrosis visualized. Left Kidney: Length: 15.1 cm. Normal echogenicity without hydronephrosis. Anechoic cyst in the left kidney lower pole measures up to 2.5 cm with acoustic enhancement. Bladder: Appears normal for degree of bladder distention. IMPRESSION: No acute abnormality.  No hydronephrosis. Left renal cyst. Electronically Signed   By: Markus Daft M.D.   On: 11/02/2015 17:00        Scheduled Meds: . amLODipine  10 mg Oral Daily  . collagenase   Topical Daily  . ertapenem  1 g Intravenous Q24H  . heparin  5,000 Units Subcutaneous Q8H  . hydrALAZINE  25 mg Oral Q8H  . insulin aspart  0-15 Units Subcutaneous TID WC  . insulin glargine  10 Units Subcutaneous QHS  .  pregabalin  75 mg Oral BID   Continuous Infusions: . sodium chloride 125 mL/hr at 11/04/15 0905     LOS: 2 days    Time spent: 24 minutes    Jais Demir, MD, FACP, FHM. [facility] Pager [phone]  If 7PM-7AM, please contact night-coverage [email or web address] Password [facility] 11/04/2015, 12:21 PM

## 2015-11-04 NOTE — Consult Note (Signed)
[place] Nurse wound consult note Reason for Consult: Left foot wound Wound type: Diabetic Foot Ulcer  Measurement: 9 cm x 5 cm Wound bed: 50% yellow/bown slough; 50% pink granulation tissue Drainage (amount, consistency, odor) Scant yellow, serosanginous; no odor Periwound: Intact Dressing procedure/placement/frequency: Santyl, gauze per Santyl order  Discussed POC with patient and bedside nurse.  Re consult if needed, will not follow at this time. Thanks Val Riles MSN, RN, CNS-BC, [employer]

## 2015-11-05 LAB — GLUCOSE, CAPILLARY
GLUCOSE-CAPILLARY: 173 mg/dL — AB (ref 65–99)
Glucose-Capillary: 139 mg/dL — ABNORMAL HIGH (ref 65–99)
Glucose-Capillary: 147 mg/dL — ABNORMAL HIGH (ref 65–99)
Glucose-Capillary: 177 mg/dL — ABNORMAL HIGH (ref 65–99)

## 2015-11-05 LAB — RENAL FUNCTION PANEL
ANION GAP: 7 (ref 5–15)
Albumin: 3.1 g/dL — ABNORMAL LOW (ref 3.5–5.0)
BUN: 33 mg/dL — ABNORMAL HIGH (ref 6–20)
CHLORIDE: 105 mmol/L (ref 101–111)
CO2: 24 mmol/L (ref 22–32)
Calcium: 8.9 mg/dL (ref 8.9–10.3)
Creatinine, Ser: 3.58 mg/dL — ABNORMAL HIGH (ref 0.61–1.24)
GFR, EST AFRICAN AMERICAN: 23 mL/min — AB (ref >60)
GFR, EST NON AFRICAN AMERICAN: 20 mL/min — AB (ref >60)
Glucose, Bld: 204 mg/dL — ABNORMAL HIGH (ref 65–99)
POTASSIUM: 3.9 mmol/L (ref 3.5–5.1)
Phosphorus: 4.6 mg/dL (ref 2.5–4.6)
Sodium: 136 mmol/L (ref 135–145)

## 2015-11-05 LAB — VANCOMYCIN, RANDOM: VANCOMYCIN RM: 28

## 2015-11-05 MED ORDER — LABETALOL HCL 100 MG PO TABS
100.0000 mg | ORAL_TABLET | Freq: Two times a day (BID) | ORAL | Status: DC
  Administered 2015-11-05 – 2015-11-10 (×11): 100 mg via ORAL
  Filled 2015-11-05 (×11): qty 1

## 2015-11-05 NOTE — Progress Notes (Addendum)
Danny NOTE  Danny Fuller  [id number] DOB: [birth year] DOA: 11/02/2015 PCP: [name], Danny Fuller   Brief Narrative:  Danny Fuller is a 40 y.o. 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 Sharol Given), HTN, GERD.  Patient was discharged on 9/1 and was to complete a 6 week course of invanz and vanc (see Dr. Hale Bogus last Danny note).  He was sent by his PCP (Dr. Sarajane Jews) due to lab work drawn on the morning of admission which showed creat of 3.3 and vanc trough of 61.  Clinically he is asymptomatic from a renal standpoint. Nephrology was consulted.  Assessment & Plan:   Principal Problem:   ARF (acute renal failure) (HCC) Active Problems:   Type 2 diabetes mellitus with diabetic neuropathy, with long-term current use of insulin (HCC)   Essential hypertension   Diabetic foot infection ([place])   AKI -Baseline creatinine 0.8 from 10/18/2015 came in with creatinine of 4.0. -Reportedly vancomycin trough was 61 from outpatient records. Patient also on lisinopril 40 mg. -UA on 11/02/15: Appears bland. Renal ultrasound 9/19 without acute abnormality or hydronephrosis. - Nephrology consultation and follow-up appreciated. Acute kidney injury presumed related to vancomycin toxicity possibly compounded by use of lisinopril. Patient states that he had not yet started Lasix. - Continue IV fluids. Creatinine starting to gradually decrease. 4.03 > 3.8 to >3.55 -Stopped vancomycin, check vancomycin random level > decreasing 57>45. - Creatinine has plateaued since yesterday. Nephrology follow-up appreciated. Recommend continuing IV fluids until renal functions show clear trend towards improving.  Diabetic foot infection -Left diabetic foot infection, was on vancomycin and Invanz. -Patient was followed with PCP and ID, Invanz continued. Vancomycin DC'ed d/t AKI - Discussed with Dr. Michel Bickers, ID on  11/03/2015> MSSA and Klebsiella from left foot wound> recommends completing IV ertapenem through 11/21/15 and no need to recommence vancomycin or change to other antibiotics. - - Wound care consultation appreciated and recommendations as below:  Wound type: Diabetic Foot Ulcer  Measurement: 9 cm x 5 cm Wound bed: 50% yellow/bown slough; 50% pink granulation tissue Drainage (amount, consistency, odor) Scant yellow, serosanginous; no odor Periwound: Intact Dressing procedure/placement/frequency: Santyl, gauze per Santyl order   Hypertension -Elevated blood pressure, patient reported this is all started after Invanz started. -Continue amlodipine, hold Lasix and lisinopril. IV hydralazine as needed. Blood pressures elevated in the 170-180/80-90 range despite adding hydralazine & hence Labetalol added. Monitor.  Diabetes mellitus type 2 -Hold metformin, Lantus 10 units, SSI and are modified diet. Well controlled in the hospital.  Anemia - Stable   DVT prophylaxis: Heparin Code Status: Full Code Family Communication: None at bedside Disposition Plan: DC home, pending consistent improvement in renal functions.  Consultants:   Nephrology  Procedures:   None  Antimicrobials:   None  Subjective - Feels tired. Denies any other complaints. Having good urine output.   Objective: Vitals:   11/04/15 1345 11/04/15 1511 11/04/15 2121 11/05/15 0543  BP: (!) 175/86 (!) 188/99 (!) 177/86 (!) 172/96  Pulse: 88 87 87 91  Resp:   18 18  Temp:  98.5 F (36.9 C) 98.2 F (36.8 C) 98.2 F (36.8 C)  TempSrc:  Oral Oral Oral  SpO2:  100% 100% 98%  Weight:      Height:        Intake/Output Summary (Last 24 hours) at 11/05/15 0819 Last data filed at 11/05/15 0747  Gross per 24 hour  Intake          1269.17 ml  Output             1800 ml  Net          -530.83 ml   Filed Weights   11/02/15 0132 11/02/15 0415  Weight: 103.9 kg (229 lb) 105.4 kg (232 lb 6.4 oz)     Examination: General exam: Appears calm and comfortable  Respiratory system: Clear to auscultation. Respiratory effort normal. Cardiovascular system: S1 & S2 heard, RRR. No JVD, murmurs, rubs, gallops or clicks. No pedal edema. Gastrointestinal system: Abdomen is nondistended, soft and nontender. No organomegaly or masses felt. Normal bowel sounds heard. Central nervous system: Alert and oriented. No focal neurological deficits. Extremities: Symmetric 5 x 5 power. Skin: Left lateral foot wound without acute findings. Seems to be healing well. Psychiatry: Judgement and insight appear normal. Mood & affect appropriate.   Data Reviewed: I have personally reviewed following labs and imaging studies  CBC:  Recent Labs Lab 11/01/15 2144 11/03/15 0500 11/04/15 0500  WBC 8.6 5.4 5.0  NEUTROABS 4.9  --   --   HGB 12.6* 11.2* 10.9*  HCT 37.0* 33.3* 32.8*  MCV 85.3 85.8 85.6  PLT 273 266 [id number]   Basic Metabolic Panel:  Recent Labs Lab 11/01/15 2144 11/03/15 0500 11/04/15 0547 11/05/15 0425  NA 136 137 137 136  K 5.0 4.2 4.0 3.9  CL 105 109 107 105  CO2 22 24 23 24   GLUCOSE 205* 177* 172* 204*  BUN 35* 38* 36* 33*  CREATININE 4.03* 3.82* 3.55* 3.58*  CALCIUM 9.2 8.8* 8.7* 8.9  PHOS  --  5.1* 5.2* 4.6   GFR: Estimated Creatinine Clearance: 33.5 mL/min (by C-G formula based on SCr of 3.58 mg/dL (H)). Liver Function Tests:  Recent Labs Lab 11/01/15 2144 11/03/15 0500 11/04/15 0547 11/05/15 0425  AST 28  --   --   --   ALT 18  --   --   --   ALKPHOS 69  --   --   --   BILITOT 1.3*  --   --   --   PROT 7.2  --   --   --   ALBUMIN 3.9 3.2* 3.0* 3.1*   No results for input(s): LIPASE, AMYLASE in the last 168 hours. No results for input(s): AMMONIA in the last 168 hours. Coagulation Profile: No results for input(s): INR, PROTIME in the last 168 hours. Cardiac Enzymes: No results for input(s): CKTOTAL, CKMB, CKMBINDEX, TROPONINI in the last 168 hours. BNP (last 3  results) No results for input(s): PROBNP in the last 8760 hours. HbA1C: No results for input(s): HGBA1C in the last 72 hours. CBG:  Recent Labs Lab 11/04/15 0831 11/04/15 1205 11/04/15 1651 11/04/15 2107 11/05/15 0743  GLUCAP 154* 128* 109* 138* 173*   Lipid Profile: No results for input(s): CHOL, HDL, LDLCALC, TRIG, CHOLHDL, LDLDIRECT in the last 72 hours. Thyroid Function Tests: No results for input(s): TSH, T4TOTAL, FREET4, T3FREE, THYROIDAB in the last 72 hours. Anemia Panel: No results for input(s): VITAMINB12, FOLATE, FERRITIN, TIBC, IRON, RETICCTPCT in the last 72 hours. Urine analysis:    Component Value Date/Time   COLORURINE YELLOW 11/04/2015 0100   APPEARANCEUR CLEAR 11/04/2015 0100   LABSPEC 1.008 11/04/2015 0100   PHURINE 5.5 11/04/2015 0100   GLUCOSEU NEGATIVE 11/04/2015 0100   HGBUR NEGATIVE 11/04/2015 0100   HGBUR negative 04/17/2008 0929   BILIRUBINUR NEGATIVE 11/04/2015 0100   BILIRUBINUR n 04/23/2013  [place] 11/04/2015 0100   PROTEINUR NEGATIVE 11/04/2015 0100   UROBILINOGEN 0.2 04/23/2013 1350   UROBILINOGEN 4.0 (H) 08/09/2009 0938   NITRITE NEGATIVE 11/04/2015 0100   LEUKOCYTESUR NEGATIVE 11/04/2015 0100    Radiology Studies: No results found.      Scheduled Meds: . amLODipine  10 mg Oral Daily  . collagenase   Topical Daily  . ertapenem  1 g Intravenous Q24H  . heparin  5,000 Units Subcutaneous Q8H  . hydrALAZINE  25 mg Oral Q8H  . insulin aspart  0-15 Units Subcutaneous TID WC  . insulin glargine  10 Units Subcutaneous QHS  . pregabalin  75 mg Oral BID   Continuous Infusions: . sodium chloride 75 mL/hr at 11/04/15 1332     LOS: 3 days    Time spent: 66 minutes    HONGALGI,ANAND, Danny Fuller, FACP, FHM. [facility] Pager [phone]  If 7PM-7AM, please contact night-coverage [email or web address] Password [facility] 11/05/2015, 8:19 AM

## 2015-11-05 NOTE — Progress Notes (Signed)
[facility] Rounding Note  Subjective/Interval History:   Still making pretty good urine but creatinine stalled out Taking some po fluids - not that much Still with elevated vanco of 28 - pharmacy now estimates t/12 at closer to 72 hours... BP's have been elevated  Objective  Vitals:   11/04/15 1345 11/04/15 1511 11/04/15 2121 11/05/15 0543  BP: (!) 175/86 (!) 188/99 (!) 177/86 (!) 172/96  Pulse: 88 87 87 91  Resp:   18 18  Temp:  98.5 F (36.9 C) 98.2 F (36.8 C) 98.2 F (36.8 C)  TempSrc:  Oral Oral Oral  SpO2:  100% 100% 98%  Weight:      Height:       Weight change:   Intake/Output Summary (Last 24 hours) at 11/05/15 1248 Last data filed at 11/05/15 1004  Gross per 24 hour  Intake          1459.17 ml  Output             1800 ml  Net          -340.83 ml   Physical Exam:  Blood pressure (!) 172/96, pulse 91, temperature 98.2 F (36.8 C), temperature source Oral, resp. rate 18, height 5\' 9"  (1.753 m), weight 105.4 kg (232 lb 6.4 oz), SpO2 98 %.  Gen: Very nice soft spoken AAM VS as noted PICC line RU arm No JVD Lungs clear Cardiac rhythm regular, S1S2 normal. No S3 or S4. no rub or gallop Abdomen soft, + BS, no focal abdominal tenderness 1+ LLE edema, no RLE edema L foot not unwrapped Alert, Ox3, no focal deficit  Labs:  Recent Labs Lab 11/01/15 2144 11/03/15 0500 11/04/15 0547 11/05/15 0425  NA 136 137 137 136  K 5.0 4.2 4.0 3.9  CL 105 109 107 105  CO2 22 24 23 24   GLUCOSE 205* 177* 172* 204*  BUN 35* 38* 36* 33*  CREATININE 4.03* 3.82* 3.55* 3.58*  CALCIUM 9.2 8.8* 8.7* 8.9  PHOS  --  5.1* 5.2* 4.6     Recent Labs Lab 11/01/15 2144 11/03/15 0500 11/04/15 0547 11/05/15 0425  AST 28  --   --   --   ALT 18  --   --   --   ALKPHOS 69  --   --   --   BILITOT 1.3*  --   --   --   PROT 7.2  --   --   --   ALBUMIN 3.9 3.2* 3.0* 3.1*     Recent Labs Lab 11/01/15 2144 11/03/15 0500 11/04/15 0500  WBC 8.6 5.4 5.0  NEUTROABS 4.9  --   --   HGB  12.6* 11.2* 10.9*  HCT 37.0* 33.3* 32.8*  MCV 85.3 85.8 85.6  PLT 273 266 265     Recent Labs Lab 11/04/15 1205 11/04/15 1651 11/04/15 2107 11/05/15 0743 11/05/15 1219  GLUCAP 128* 109* 138* 173* 139*   Results for Rafter, Dorsel B (MRN [id number]) as of 11/02/2015 16:26  11/02/2015 15:12  Appearance CLEAR  Bilirubin Urine NEGATIVE  Color, Urine YELLOW  Glucose NEGATIVE  Hgb urine dipstick NEGATIVE  Ketones, ur NEGATIVE  Leukocytes, UA NEGATIVE  Nitrite NEGATIVE  pH 5.0  Protein NEGATIVE  Specific Gravity, Urine 1.015   Results for ENIO, VALLEE (MRN [id number]) as of 11/05/2015 12:49  11/02/2015 19:16 11/03/2015 05:00 11/05/2015 04:25  Vancomycin Rm 57 (HH) 45 28    Medications: . sodium chloride 75 mL/hr at 11/04/15 1332   .  amLODipine  10 mg Oral Daily  . collagenase   Topical Daily  . ertapenem  1 g Intravenous Q24H  . heparin  5,000 Units Subcutaneous Q8H  . hydrALAZINE  25 mg Oral Q8H  . insulin aspart  0-15 Units Subcutaneous TID WC  . insulin glargine  10 Units Subcutaneous QHS  . labetalol  100 mg Oral BID  . pregabalin  75 mg Oral BID    I  have reviewed scheduled and prn medications.  Background:  40 y.o. with  PMH of DM2, HTN, GERD, osteomyelitis of the left foot (ray amputation/abscess drainage 07/26/15 with cx + group B strep and MRSE --> failed oral ATB,s and admitted  8/27-9/1 with a wound infection and MRI confirming osteo. Cultures + S aureus and klebsiella ornitholytica.  He was started on vanco and zosyn, then changed to vanco/ertapenem to complete a course 11/21/15.   He was called to come into the hospital 9/18 because of labs rec'd showing a creatinine of 3.3 and a vanco trough of 61 (previous labs - don't know what date - showed creatinine of 0.8 and vanco trough of 15. On 10/14/15 trough was 19).  On admission creatinine was 4 and we were asked to see.    Assessment/Recommendations  1. Acute kidney injury secondary to vancomycin toxicity,  possible compounded by use of lisinopril and lasix. Urinalysis entirely negative. Non-oliguric. Creatinine had started to trend down, but no improvement last 24 hours. Pharmacy now  estimates current t/1/2 of vanco to be around 72 hours. Fluids now at 75/hour and have encouraged po.  I would recommend that he stay in the hospital a little longer for renal function monitoring until very clear trend toward improvement. 2. Diabetic foot ulcer with osteomyelitis (staph aureus, Klebsiella) 3. DM2 - lantus. Holding  metformin in face of AKI 4. HTN  - on amlodipine/lisinopril/furosemide PTA - lisinopril on hold. BP too high.  Current amlodipine and hydralazine. HR on the higher   Jamal Maes, MD [facility] [phone] pager 11/05/2015, 12:48 PM

## 2015-11-06 LAB — RENAL FUNCTION PANEL
ALBUMIN: 2.9 g/dL — AB (ref 3.5–5.0)
Anion gap: 9 (ref 5–15)
BUN: 30 mg/dL — AB (ref 6–20)
CO2: 25 mmol/L (ref 22–32)
Calcium: 9.2 mg/dL (ref 8.9–10.3)
Chloride: 105 mmol/L (ref 101–111)
Creatinine, Ser: 3.65 mg/dL — ABNORMAL HIGH (ref 0.61–1.24)
GFR calc Af Amer: 23 mL/min — ABNORMAL LOW (ref >60)
GFR calc non Af Amer: 20 mL/min — ABNORMAL LOW (ref >60)
GLUCOSE: 195 mg/dL — AB (ref 65–99)
PHOSPHORUS: 5.2 mg/dL — AB (ref 2.5–4.6)
Potassium: 4 mmol/L (ref 3.5–5.1)
SODIUM: 139 mmol/L (ref 135–145)

## 2015-11-06 LAB — GLUCOSE, CAPILLARY
GLUCOSE-CAPILLARY: 139 mg/dL — AB (ref 65–99)
GLUCOSE-CAPILLARY: 161 mg/dL — AB (ref 65–99)
Glucose-Capillary: 139 mg/dL — ABNORMAL HIGH (ref 65–99)
Glucose-Capillary: 161 mg/dL — ABNORMAL HIGH (ref 65–99)

## 2015-11-06 MED ORDER — GLUCERNA SHAKE PO LIQD
237.0000 mL | Freq: Two times a day (BID) | ORAL | Status: DC
Start: 2015-11-07 — End: 2015-11-10
  Administered 2015-11-07 – 2015-11-10 (×7): 237 mL via ORAL

## 2015-11-06 MED ORDER — ENSURE ENLIVE PO LIQD: 237.0000 mL | Freq: Two times a day (BID) | ORAL | Status: DC

## 2015-11-06 NOTE — Progress Notes (Signed)
[facility] Rounding Note  Subjective/Interval History:   UOP good Creatinine sl worse (at least no better) No IV intake recorded for yesterday (???)  Objective  Vitals:   11/05/15 1522 11/05/15 2143 11/06/15 0212 11/06/15 0517  BP: (!) 175/94 (!) 173/95 (!) 161/89 (!) 168/93  Pulse: 90 96 89 87  Resp: 18 18 18 18   Temp: 98.2 F (36.8 C) 98.1 F (36.7 C) 98.2 F (36.8 C) 97.3 F (36.3 C)  TempSrc: Oral Oral Oral Oral  SpO2: 99% 99% 97% 100%  Weight:      Height:       Weight change:   Intake/Output Summary (Last 24 hours) at 11/06/15 1149 Last data filed at 11/06/15 1023  Gross per 24 hour  Intake              960 ml  Output             2025 ml  Net            -1065 ml   Physical Exam:  Blood pressure (!) 168/93, pulse 87, temperature 97.3 F (36.3 C), temperature source Oral, resp. rate 18, height 5\' 9"  (1.753 m), weight 105.4 kg (232 lb 6.4 oz), SpO2 100 %.  Very nice  AAM NAD VS as noted PICC line RU arm No JVD Lungs clear Cardiac rhythm regular, S1S2 normal.  No S3 or S4.  Abdomen soft, + BS, no focal abdominal tenderness 1+ LLE edema, no RLE edema L foot not unwrapped Alert, Ox3,  Labs:  Recent Labs Lab 11/01/15 2144 11/03/15 0500 11/04/15 0547 11/05/15 0425 11/06/15 0422  NA 136 137 137 136 139  K 5.0 4.2 4.0 3.9 4.0  CL 105 109 107 105 105  CO2 22 24 23 24 25   GLUCOSE 205* 177* 172* 204* 195*  BUN 35* 38* 36* 33* 30*  CREATININE 4.03* 3.82* 3.55* 3.58* 3.65*  CALCIUM 9.2 8.8* 8.7* 8.9 9.2  PHOS  --  5.1* 5.2* 4.6 5.2*     Recent Labs Lab 11/01/15 2144  11/04/15 0547 11/05/15 0425 11/06/15 0422  AST 28  --   --   --   --   ALT 18  --   --   --   --   ALKPHOS 69  --   --   --   --   BILITOT 1.3*  --   --   --   --   PROT 7.2  --   --   --   --   ALBUMIN 3.9  < > 3.0* 3.1* 2.9*  < > = values in this interval not displayed.   Recent Labs Lab 11/01/15 2144 11/03/15 0500 11/04/15 0500  WBC 8.6 5.4 5.0  NEUTROABS 4.9  --   --   HGB  12.6* 11.2* 10.9*  HCT 37.0* 33.3* 32.8*  MCV 85.3 85.8 85.6  PLT 273 266 265     Recent Labs Lab 11/05/15 0743 11/05/15 1219 11/05/15 1710 11/05/15 2141 11/06/15 0824  GLUCAP 173* 139* 147* 177* 139*   Results for KAILEB, PERDOMO (MRN [id number]) as of 11/02/2015 16:26  11/02/2015 15:12  Appearance CLEAR  Bilirubin Urine NEGATIVE  Color, Urine YELLOW  Glucose NEGATIVE  Hgb urine dipstick NEGATIVE  Ketones, ur NEGATIVE  Leukocytes, UA NEGATIVE  Nitrite NEGATIVE  pH 5.0  Protein NEGATIVE  Specific Gravity, Urine 1.015   Results for KATINA, TAIRA (MRN [id number]) as of 11/05/2015 12:49  11/02/2015 19:16 11/03/2015 05:00 11/05/2015 04:25  Vancomycin  Rm 57 (HH) 45 28    Medications: . sodium chloride 1,000 mL (11/06/15 1023)   . amLODipine  10 mg Oral Daily  . collagenase   Topical Daily  . ertapenem  1 g Intravenous Q24H  . heparin  5,000 Units Subcutaneous Q8H  . hydrALAZINE  25 mg Oral Q8H  . insulin aspart  0-15 Units Subcutaneous TID WC  . insulin glargine  10 Units Subcutaneous QHS  . labetalol  100 mg Oral BID  . pregabalin  75 mg Oral BID    I  have reviewed scheduled and prn medications.  Background: 40 y.o. with PMH of DM2, HTN, GERD, osteomyelitis of the left foot  - >ray amputation/abscess drainage 07/26/15 with cx + group B strep and MRSE --> failed oral ATB's and admitted  8/27-9/1 with a wound infection and MRI confirming osteo. Cultures + S aureus and klebsiella ornitholytica.  Started on vanco and zosyn, then changed to vanco/ertapenem to complete a course 11/21/15.   Was called to come into the hospital 9/18 because of labs showing creatinine up to 3.3 and a vanco trough of 61.  Baseline creatinine 0.9 (10/28/15).   Assessment/Recommendations  1. Acute kidney injury secondary to vancomycin toxicity, compounded by use of lisinopril and lasix. Baseline 0.9 on 10/28/15. Urinalysis entirely negative. Non-oliguric. Creatinine a little higher than yesterday  - on review there has actually really been no appreciable change in GFR one way or the other since admission, and as of yesterday still with measurable vancomycin with estimated 72 hour half life so prolonged and persistent exposure.  1. Recommend increase fluids back to 125/hour.   2. Will continue to monitor.  3. Keep until clear trend toward improvement. 2. Diabetic foot ulcer with osteomyelitis (staph aureus, Klebsiella) - ertapenem 3. DM2 - lantus. Holding  metformin in face of AKI 4. HTN  - on amlodipine/lisinopril/furosemide PTA - lisinopril on hold. Labetolol added yesterday 1. Increase to 200 mg bid 2. Continue amlodipine 10   Jamal Maes, MD [facility] [phone] pager 11/06/2015, 11:49 AM

## 2015-11-06 NOTE — Progress Notes (Signed)
PROGRESS NOTE  Danny Fuller  [id number] DOB: [birth year] DOA: 11/02/2015 PCP: [name], MD   Brief Narrative:  Danny Fuller is a 40 y.o. 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 Sharol Given), HTN, GERD.  Patient was discharged on 9/1 and was to complete a 6 week course of invanz and vanc (see Dr. Hale Bogus last progress note).  He was sent by his PCP (Dr. Sarajane Jews) due to lab work drawn on the morning of admission which showed creat of 3.3 and vanc trough of 61.  Clinically he is asymptomatic from a renal standpoint. Nephrology was consulted.  Assessment & Plan:   Principal Problem:   ARF (acute renal failure) (HCC) Active Problems:   Type 2 diabetes mellitus with diabetic neuropathy, with long-term current use of insulin (HCC)   Essential hypertension   Diabetic foot infection ([place])   AKI -Baseline creatinine 0.8 from 10/18/2015 came in with creatinine of 4.0. -Reportedly vancomycin trough was 61 from outpatient records. Patient also on lisinopril 40 mg. -UA on 11/02/15: Appears bland. Renal ultrasound 9/19 without acute abnormality or hydronephrosis. - Nephrology consultation and follow-up appreciated. Acute kidney injury presumed related to vancomycin toxicity possibly compounded by use of lisinopril. Patient states that he had not yet started Lasix. - Continue IV fluids. Creatinine starting to gradually decrease. 4.03 > 3.8 to >3.55 -Stopped vancomycin, check vancomycin random level > decreasing 57>45. - Creatinine slightly worse compared to yesterday. Nephrology follow-up appreciated >increased IV fluids 125 ML per hour and keep inpatient until renal functions show clear trend towards improving.  Diabetic foot infection -Left diabetic foot infection, was on vancomycin and Invanz. -Patient was followed with PCP and ID, Invanz continued. Vancomycin DC'ed d/t AKI - Discussed with  Dr. Michel Bickers, ID on 11/03/2015> MSSA and Klebsiella from left foot wound> recommends completing IV ertapenem through 11/21/15 and no need to recommence vancomycin or change to other antibiotics. - - Wound care consultation appreciated and recommendations as below:  Wound type: Diabetic Foot Ulcer  Measurement: 9 cm x 5 cm Wound bed: 50% yellow/bown slough; 50% pink granulation tissue Drainage (amount, consistency, odor) Scant yellow, serosanginous; no odor Periwound: Intact Dressing procedure/placement/frequency: Santyl, gauze per Santyl order   Hypertension -Elevated blood pressure, patient reported this is all started after Invanz started. -Continue amlodipine, hold Lasix and lisinopril. IV hydralazine as needed. Blood pressures elevated in the 170-180/80-90 range despite adding hydralazine & hence Labetalol added an increased dose to 200 MG twice a day on 9/23. Monitor.  Diabetes mellitus type 2 -Hold metformin, Lantus 10 units, SSI and are modified diet. Well controlled in the hospital.  Anemia - Stable   DVT prophylaxis: Heparin Code Status: Full Code Family Communication: None at bedside Disposition Plan: DC home, pending consistent improvement in renal functions.  Consultants:   Nephrology  Procedures:   None  Antimicrobials:   None  Subjective - Having good urine output. States that he's been drinking more liquids. No other complaints reported  Objective: Vitals:   11/05/15 1522 11/05/15 2143 11/06/15 0212 11/06/15 0517  BP: (!) 175/94 (!) 173/95 (!) 161/89 (!) 168/93  Pulse: 90 96 89 87  Resp: 18 18 18 18   Temp: 98.2 F (36.8 C) 98.1 F (36.7 C) 98.2 F (36.8 C) 97.3 F (36.3 C)  TempSrc: Oral Oral Oral Oral  SpO2: 99% 99% 97% 100%  Weight:      Height:  Intake/Output Summary (Last 24 hours) at 11/06/15 0728 Last data filed at 11/05/15 2143  Gross per 24 hour  Intake              960 ml  Output             2025 ml  Net             -1065 ml   Filed Weights   11/02/15 0132 11/02/15 0415  Weight: 103.9 kg (229 lb) 105.4 kg (232 lb 6.4 oz)    Examination: General exam: Appears calm and comfortable  Respiratory system: Clear to auscultation. Respiratory effort normal. Cardiovascular system: S1 & S2 heard, RRR. No JVD, murmurs, rubs, gallops or clicks. No pedal edema. Gastrointestinal system: Abdomen is nondistended, soft and nontender. No organomegaly or masses felt. Normal bowel sounds heard. Central nervous system: Alert and oriented. No focal neurological deficits. Extremities: Symmetric 5 x 5 power. Skin: Left lateral foot wound without acute findings. Seems to be healing well. Psychiatry: Judgement and insight appear normal. Mood & affect appropriate.   Data Reviewed: I have personally reviewed following labs and imaging studies  CBC:  Recent Labs Lab 11/01/15 2144 11/03/15 0500 11/04/15 0500  WBC 8.6 5.4 5.0  NEUTROABS 4.9  --   --   HGB 12.6* 11.2* 10.9*  HCT 37.0* 33.3* 32.8*  MCV 85.3 85.8 85.6  PLT 273 266 [id number]   Basic Metabolic Panel:  Recent Labs Lab 11/01/15 2144 11/03/15 0500 11/04/15 0547 11/05/15 0425 11/06/15 0422  NA 136 137 137 136 139  K 5.0 4.2 4.0 3.9 4.0  CL 105 109 107 105 105  CO2 22 24 23 24 25   GLUCOSE 205* 177* 172* 204* 195*  BUN 35* 38* 36* 33* 30*  CREATININE 4.03* 3.82* 3.55* 3.58* 3.65*  CALCIUM 9.2 8.8* 8.7* 8.9 9.2  PHOS  --  5.1* 5.2* 4.6 5.2*   GFR: Estimated Creatinine Clearance: 32.8 mL/min (by C-G formula based on SCr of 3.65 mg/dL (H)). Liver Function Tests:  Recent Labs Lab 11/01/15 2144 11/03/15 0500 11/04/15 0547 11/05/15 0425 11/06/15 0422  AST 28  --   --   --   --   ALT 18  --   --   --   --   ALKPHOS 69  --   --   --   --   BILITOT 1.3*  --   --   --   --   PROT 7.2  --   --   --   --   ALBUMIN 3.9 3.2* 3.0* 3.1* 2.9*   No results for input(s): LIPASE, AMYLASE in the last 168 hours. No results for input(s): AMMONIA in the last 168  hours. Coagulation Profile: No results for input(s): INR, PROTIME in the last 168 hours. Cardiac Enzymes: No results for input(s): CKTOTAL, CKMB, CKMBINDEX, TROPONINI in the last 168 hours. BNP (last 3 results) No results for input(s): PROBNP in the last 8760 hours. HbA1C: No results for input(s): HGBA1C in the last 72 hours. CBG:  Recent Labs Lab 11/04/15 2107 11/05/15 0743 11/05/15 1219 11/05/15 1710 11/05/15 2141  GLUCAP 138* 173* 139* 147* 177*   Lipid Profile: No results for input(s): CHOL, HDL, LDLCALC, TRIG, CHOLHDL, LDLDIRECT in the last 72 hours. Thyroid Function Tests: No results for input(s): TSH, T4TOTAL, FREET4, T3FREE, THYROIDAB in the last 72 hours. Anemia Panel: No results for input(s): VITAMINB12, FOLATE, FERRITIN, TIBC, IRON, RETICCTPCT in the last 72 hours. Urine analysis:    Component  Value Date/Time   COLORURINE YELLOW 11/04/2015 0100   APPEARANCEUR CLEAR 11/04/2015 0100   LABSPEC 1.008 11/04/2015 0100   PHURINE 5.5 11/04/2015 0100   GLUCOSEU NEGATIVE 11/04/2015 0100   HGBUR NEGATIVE 11/04/2015 0100   HGBUR negative 04/17/2008 0929   BILIRUBINUR NEGATIVE 11/04/2015 0100   BILIRUBINUR n 04/23/2013 1350   KETONESUR NEGATIVE 11/04/2015 0100   PROTEINUR NEGATIVE 11/04/2015 0100   UROBILINOGEN 0.2 04/23/2013 1350   UROBILINOGEN 4.0 (H) 08/09/2009 0938   NITRITE NEGATIVE 11/04/2015 0100   LEUKOCYTESUR NEGATIVE 11/04/2015 0100    Radiology Studies: No results found.      Scheduled Meds: . amLODipine  10 mg Oral Daily  . collagenase   Topical Daily  . ertapenem  1 g Intravenous Q24H  . heparin  5,000 Units Subcutaneous Q8H  . hydrALAZINE  25 mg Oral Q8H  . insulin aspart  0-15 Units Subcutaneous TID WC  . insulin glargine  10 Units Subcutaneous QHS  . labetalol  100 mg Oral BID  . pregabalin  75 mg Oral BID   Continuous Infusions: . sodium chloride 75 mL/hr at 11/04/15 1332     LOS: 4 days    Time spent: 15  minutes    Kamaryn Grimley, MD, FACP, FHM. [facility] Pager [phone]  If 7PM-7AM, please contact night-coverage [email or web address] Password [facility] 11/06/2015, 7:28 AM

## 2015-11-07 LAB — CBC
HCT: 35.4 % — ABNORMAL LOW (ref 39.0–52.0)
Hemoglobin: 11.7 g/dL — ABNORMAL LOW (ref 13.0–17.0)
MCH: 28.5 pg (ref 26.0–34.0)
MCHC: 33.1 g/dL (ref 30.0–36.0)
MCV: 86.1 fL (ref 78.0–100.0)
PLATELETS: 313 10*3/uL (ref 150–400)
RBC: 4.11 MIL/uL — AB (ref 4.22–5.81)
RDW: 13.3 % (ref 11.5–15.5)
WBC: 5.7 10*3/uL (ref 4.0–10.5)

## 2015-11-07 LAB — RENAL FUNCTION PANEL
ALBUMIN: 3.4 g/dL — AB (ref 3.5–5.0)
Anion gap: 8 (ref 5–15)
BUN: 33 mg/dL — ABNORMAL HIGH (ref 6–20)
CHLORIDE: 105 mmol/L (ref 101–111)
CO2: 26 mmol/L (ref 22–32)
CREATININE: 3.57 mg/dL — AB (ref 0.61–1.24)
Calcium: 9.3 mg/dL (ref 8.9–10.3)
GFR, EST AFRICAN AMERICAN: 23 mL/min — AB (ref >60)
GFR, EST NON AFRICAN AMERICAN: 20 mL/min — AB (ref >60)
Glucose, Bld: 155 mg/dL — ABNORMAL HIGH (ref 65–99)
PHOSPHORUS: 5.1 mg/dL — AB (ref 2.5–4.6)
POTASSIUM: 4.1 mmol/L (ref 3.5–5.1)
Sodium: 139 mmol/L (ref 135–145)

## 2015-11-07 LAB — GLUCOSE, CAPILLARY
GLUCOSE-CAPILLARY: 193 mg/dL — AB (ref 65–99)
GLUCOSE-CAPILLARY: 170 mg/dL — AB (ref 65–99)
GLUCOSE-CAPILLARY: 154 mg/dL — AB (ref 65–99)
Glucose-Capillary: 195 mg/dL — ABNORMAL HIGH (ref 65–99)
Glucose-Capillary: 157 mg/dL — ABNORMAL HIGH (ref 65–99)

## 2015-11-07 NOTE — Progress Notes (Signed)
PROGRESS NOTE  [name]  [id number] DOB: Jul 23, 1975 DOA: 11/02/2015 PCP: [name], MD   Brief Narrative:  Danny Fuller is a 40 y.o. 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 Sharol Given), HTN, GERD.  Patient was discharged on 9/1 and was to complete a 6 week course of invanz and vanc (see Dr. Hale Bogus last progress note).  He was sent by his PCP (Dr. Sarajane Jews) due to lab work drawn on the morning of admission which showed creat of 3.3 and vanc trough of 61.  Clinically he is asymptomatic from a renal standpoint. Nephrology was consulted.  Assessment & Plan:   Principal Problem:   ARF (acute renal failure) (HCC) Active Problems:   Type 2 diabetes mellitus with diabetic neuropathy, with long-term current use of insulin (HCC)   Essential hypertension   Diabetic foot infection ([place])   AKI -Baseline creatinine 0.8 from 10/18/2015 came in with creatinine of 4.0. -Reportedly vancomycin trough was 61 from outpatient records. Patient also on lisinopril 40 mg. -UA on 11/02/15: Appears bland. Renal ultrasound 9/19 without acute abnormality or hydronephrosis. - Nephrology consultation and follow-up appreciated. Acute kidney injury presumed related to vancomycin toxicity possibly compounded by use of lisinopril. Patient states that he had not yet started Lasix. - Continue IV fluids. Creatinine starting to gradually decrease. 4.03 > 3.8 to >3.55 -Stopped vancomycin, check vancomycin random level > decreasing 57>45. - Creatinine has not shown consistent trend towards improvement. It had plateaued in the 3.5 range couple days back but increased to 3.65 on 9/23. IV fluids were increased. Creatinine down to 3.57. Follow BMP. Continue treating with fluids and monitoring until clear trend towards improvement. Nephrology follow-up appreciated  Diabetic foot infection -Left diabetic foot infection, was  on vancomycin and Invanz. -Patient was followed with PCP and ID, Invanz continued. Vancomycin DC'ed d/t AKI - Discussed with Dr. Michel Bickers, ID on 11/03/2015> MSSA and Klebsiella from left foot wound> recommends completing IV ertapenem through 11/21/15 and no need to recommence vancomycin or change to other antibiotics. - - Wound care consultation appreciated and recommendations as below:  Wound type: Diabetic Foot Ulcer  Measurement: 9 cm x 5 cm Wound bed: 50% yellow/bown slough; 50% pink granulation tissue Drainage (amount, consistency, odor) Scant yellow, serosanginous; no odor Periwound: Intact Dressing procedure/placement/frequency: Santyl, gauze per Santyl order   Hypertension -Continue amlodipine, held Lasix and lisinopril. IV hydralazine as needed. Blood pressures elevated in the 170-180/80-90 range despite adding hydralazine & hence Labetalol added and increased dose to 200 MG twice a day on 9/23. Monitor.  Diabetes mellitus type 2 -Hold metformin, Lantus 10 units, SSI and are modified diet. Well controlled in the hospital.  Anemia - Stable   DVT prophylaxis: Heparin Code Status: Full Code Family Communication: Discussed with the nephew at bedside. Disposition Plan: DC home, pending consistent improvement in renal functions.  Consultants:   Nephrology  Procedures:   None  Antimicrobials:   None  Subjective Patient reports occasional episodes of chills without fever and? Spasms. Requested RN to check temperature and CBG during such episodes.  Objective: Vitals:   11/06/15 0517 11/06/15 1445 11/06/15 2113 11/07/15 0605  BP: (!) 168/93 (!) 167/97 (!) 164/82 (!) 161/94  Pulse: 87 93 97 93  Resp: 18 17 18 16   Temp: 97.3 F (36.3 C) 97.9 F (36.6 C) 98 F (36.7 C) 97.7 F (36.5 C)  TempSrc: Oral Oral Oral Oral  SpO2: 100% 100% 99% 90%  Weight:      Height:        Intake/Output Summary (Last 24 hours) at 11/07/15 1339 Last data filed at 11/07/15 1209   Gross per 24 hour  Intake           2257.5 ml  Output             4025 ml  Net          -1767.5 ml   Filed Weights   11/02/15 0132 11/02/15 0415  Weight: 103.9 kg (229 lb) 105.4 kg (232 lb 6.4 oz)    Examination: General exam: Pleasant young male lying comfortably in bed. Family at bedside. Respiratory system: Clear to auscultation. Respiratory effort normal. Cardiovascular system: S1 & S2 heard, RRR. No JVD, murmurs, rubs, gallops or clicks. No pedal edema. Gastrointestinal system: Abdomen is nondistended, soft and nontender. No organomegaly or masses felt. Normal bowel sounds heard. Central nervous system: Alert and oriented. No focal neurological deficits. Extremities: Symmetric 5 x 5 power. Skin: Left lateral foot wound without acute findings. Seems to be healing well. Psychiatry: Judgement and insight appear normal. Mood & affect appropriate.   Data Reviewed: I have personally reviewed following labs and imaging studies  CBC:  Recent Labs Lab 11/01/15 2144 11/03/15 0500 11/04/15 0500 11/07/15 0439  WBC 8.6 5.4 5.0 5.7  NEUTROABS 4.9  --   --   --   HGB 12.6* 11.2* 10.9* 11.7*  HCT 37.0* 33.3* 32.8* 35.4*  MCV 85.3 85.8 85.6 86.1  PLT 273 266 265 [id number]   Basic Metabolic Panel:  Recent Labs Lab 11/03/15 0500 11/04/15 0547 11/05/15 0425 11/06/15 0422 11/07/15 0438  NA 137 137 136 139 139  K 4.2 4.0 3.9 4.0 4.1  CL 109 107 105 105 105  CO2 24 23 24 25 26   GLUCOSE 177* 172* 204* 195* 155*  BUN 38* 36* 33* 30* 33*  CREATININE 3.82* 3.55* 3.58* 3.65* 3.57*  CALCIUM 8.8* 8.7* 8.9 9.2 9.3  PHOS 5.1* 5.2* 4.6 5.2* 5.1*   GFR: Estimated Creatinine Clearance: 33.6 mL/min (by C-G formula based on SCr of 3.57 mg/dL (H)). Liver Function Tests:  Recent Labs Lab 11/01/15 2144 11/03/15 0500 11/04/15 0547 11/05/15 0425 11/06/15 0422 11/07/15 0438  AST 28  --   --   --   --   --   ALT 18  --   --   --   --   --   ALKPHOS 69  --   --   --   --   --   BILITOT 1.3*   --   --   --   --   --   PROT 7.2  --   --   --   --   --   ALBUMIN 3.9 3.2* 3.0* 3.1* 2.9* 3.4*   No results for input(s): LIPASE, AMYLASE in the last 168 hours. No results for input(s): AMMONIA in the last 168 hours. Coagulation Profile: No results for input(s): INR, PROTIME in the last 168 hours. Cardiac Enzymes: No results for input(s): CKTOTAL, CKMB, CKMBINDEX, TROPONINI in the last 168 hours. BNP (last 3 results) No results for input(s): PROBNP in the last 8760 hours. HbA1C: No results for input(s): HGBA1C in the last 72 hours. CBG:  Recent Labs Lab 11/06/15 1149 11/06/15 1800 11/06/15 2115 11/07/15 0803 11/07/15 1207  GLUCAP 139* 161* 161* 193* 195*   Lipid Profile: No results for input(s): CHOL, HDL, LDLCALC, TRIG, CHOLHDL,  LDLDIRECT in the last 72 hours. Thyroid Function Tests: No results for input(s): TSH, T4TOTAL, FREET4, T3FREE, THYROIDAB in the last 72 hours. Anemia Panel: No results for input(s): VITAMINB12, FOLATE, FERRITIN, TIBC, IRON, RETICCTPCT in the last 72 hours. Urine analysis:    Component Value Date/Time   COLORURINE YELLOW 11/04/2015 0100   APPEARANCEUR CLEAR 11/04/2015 0100   LABSPEC 1.008 11/04/2015 0100   PHURINE 5.5 11/04/2015 0100   GLUCOSEU NEGATIVE 11/04/2015 0100   HGBUR NEGATIVE 11/04/2015 0100   HGBUR negative 04/17/2008 0929   BILIRUBINUR NEGATIVE 11/04/2015 0100   BILIRUBINUR n 04/23/2013 1350   KETONESUR NEGATIVE 11/04/2015 0100   PROTEINUR NEGATIVE 11/04/2015 0100   UROBILINOGEN 0.2 04/23/2013 1350   UROBILINOGEN 4.0 (H) 08/09/2009 0938   NITRITE NEGATIVE 11/04/2015 0100   LEUKOCYTESUR NEGATIVE 11/04/2015 0100    Radiology Studies: No results found.      Scheduled Meds: . amLODipine  10 mg Oral Daily  . collagenase   Topical Daily  . ertapenem  1 g Intravenous Q24H  . feeding supplement (GLUCERNA SHAKE)  237 mL Oral BID BM  . heparin  5,000 Units Subcutaneous Q8H  . hydrALAZINE  25 mg Oral Q8H  . insulin aspart   0-15 Units Subcutaneous TID WC  . insulin glargine  10 Units Subcutaneous QHS  . labetalol  100 mg Oral BID  . pregabalin  75 mg Oral BID   Continuous Infusions: . sodium chloride 1,000 mL (11/07/15 0937)     LOS: 5 days     Sieara Bremer, MD, FACP, FHM. [facility] Pager [phone]  If 7PM-7AM, please contact night-coverage [email or web address] Password [email or web address] 11/07/2015, 1:39 PM

## 2015-11-07 NOTE — Progress Notes (Addendum)
Patient complaining of shaking with no other symptoms and per MD request staff checked CBG=154; Temp=97.3 and BP=174/96. No new orders at this time. Will continue to monitor.

## 2015-11-07 NOTE — Progress Notes (Signed)
[facility] Rounding Note  Subjective/Interval History: Continues to make good urine BP still generous Creatinine not yet trending down IVF's at 125  Objective  Vitals:   11/06/15 0517 11/06/15 1445 11/06/15 2113 11/07/15 0605  BP: (!) 168/93 (!) 167/97 (!) 164/82 (!) 161/94  Pulse: 87 93 97 93  Resp: 18 17 18 16   Temp: 97.3 F (36.3 C) 97.9 F (36.6 C) 98 F (36.7 C) 97.7 F (36.5 C)  TempSrc: Oral Oral Oral Oral  SpO2: 100% 100% 99% 90%  Weight:      Height:       Weight change:   Intake/Output Summary (Last 24 hours) at 11/07/15 0743 Last data filed at 11/07/15 0424  Gross per 24 hour  Intake             2695 ml  Output             3400 ml  Net             -705 ml   Physical Exam:  Blood pressure (!) 161/94, pulse 93, temperature 97.7 F (36.5 C), temperature source Oral, resp. rate 16, height 5\' 9"  (1.753 m), weight 105.4 kg (232 lb 6.4 oz), SpO2 90 %.   NAD PICC line RU arm No JVD Lungs clear Cardiac rhythm regular, S1S2 normal.  No S3 or S4.  Abdomen soft, + BS, no focal abdominal tenderness 1+ LLE edema, no RLE edema - same L foot wrapped Alert, Ox3,  Labs:  Recent Labs Lab 11/01/15 2144 11/03/15 0500 11/04/15 0547 11/05/15 0425 11/06/15 0422 11/07/15 0438  NA 136 137 137 136 139 139  K 5.0 4.2 4.0 3.9 4.0 4.1  CL 105 109 107 105 105 105  CO2 22 24 23 24 25 26   GLUCOSE 205* 177* 172* 204* 195* 155*  BUN 35* 38* 36* 33* 30* 33*  CREATININE 4.03* 3.82* 3.55* 3.58* 3.65* 3.57*  CALCIUM 9.2 8.8* 8.7* 8.9 9.2 9.3  PHOS  --  5.1* 5.2* 4.6 5.2* 5.1*     Recent Labs Lab 11/01/15 2144  11/05/15 0425 11/06/15 0422 11/07/15 0438  AST 28  --   --   --   --   ALT 18  --   --   --   --   ALKPHOS 69  --   --   --   --   BILITOT 1.3*  --   --   --   --   PROT 7.2  --   --   --   --   ALBUMIN 3.9  < > 3.1* 2.9* 3.4*  < > = values in this interval not displayed.   Recent Labs Lab 11/01/15 2144 11/03/15 0500 11/04/15 0500 11/07/15 0439  WBC 8.6  5.4 5.0 5.7  NEUTROABS 4.9  --   --   --   HGB 12.6* 11.2* 10.9* 11.7*  HCT 37.0* 33.3* 32.8* 35.4*  MCV 85.3 85.8 85.6 86.1  PLT 273 266 265 313     Recent Labs Lab 11/05/15 2141 11/06/15 0824 11/06/15 1149 11/06/15 1800 11/06/15 2115  GLUCAP 177* 139* 139* 161* 161*   Results for Fuller, Danny B (MRN [id number]) as of 11/02/2015 16:26  11/02/2015 15:12  Appearance CLEAR  Bilirubin Urine NEGATIVE  Color, Urine YELLOW  Glucose NEGATIVE  Hgb urine dipstick NEGATIVE  Ketones, ur NEGATIVE  Leukocytes, UA NEGATIVE  Nitrite NEGATIVE  pH 5.0  Protein NEGATIVE  Specific Gravity, Urine 1.015   Results for Fuller, Danny DESISTO (MRN  [id number]) as of 11/05/2015 12:49  11/02/2015 19:16 11/03/2015 05:00 11/05/2015 04:25  Vancomycin Rm 57 (HH) 45 28    Medications: . sodium chloride 125 mL/hr at 11/07/15 0300   . amLODipine  10 mg Oral Daily  . collagenase   Topical Daily  . ertapenem  1 g Intravenous Q24H  . feeding supplement (GLUCERNA SHAKE)  237 mL Oral BID BM  . heparin  5,000 Units Subcutaneous Q8H  . hydrALAZINE  25 mg Oral Q8H  . insulin aspart  0-15 Units Subcutaneous TID WC  . insulin glargine  10 Units Subcutaneous QHS  . labetalol  100 mg Oral BID  . pregabalin  75 mg Oral BID     Background: 40 y.o. with PMH of DM2, HTN, GERD, osteomyelitis of the left foot  - >ray amputation/abscess drainage 07/26/15 with cx + group B strep and MRSE --> failed oral ATB's and admitted  8/27-9/1 with a wound infection and MRI confirming osteo. Cultures + S aureus and klebsiella ornitholytica.  Started on vanco and zosyn, then changed to vanco/ertapenem to complete a course 11/21/15.   Was called to come back into the hospital 9/18 because of labs showing creatinine up to 3.3 and a vanco trough of 61.  Baseline creatinine 0.9  on 10/28/15. [name] 14.3, 15.1 cm kidneys and UA totally benign.  Assessment/Recommendations  1. Acute kidney injury secondary to vancomycin toxicity, compounded by  use of lisinopril and lasix. Baseline 0.9 on 10/28/15. Urinalysis entirely negative. Non-oliguric. No downward trend in creatinine yet. Still had measureable/high vanco 11/05/15 with last dose 11/01/15 and last estimated 72 hour half life - so prolonged and persistent exposure.  1. Continue IVF's.   2. Will continue to monitor.  3. Keep until clear trend toward improvement. 2. Diabetic foot ulcer with osteomyelitis (staph aureus, Klebsiella) - ertapenem 3. DM2 - lantus. Holding  metformin in face of AKI 4. HTN  - current amlodipine/labetolol/hydralazine 1. Labetolol increased to 200 mg bid on 9/23 2. Continue amlodipine 10   Jamal Maes, MD [facility] [phone] pager 11/07/2015, 7:43 AM

## 2015-11-08 LAB — GLUCOSE, CAPILLARY
GLUCOSE-CAPILLARY: 184 mg/dL — AB (ref 65–99)
GLUCOSE-CAPILLARY: 217 mg/dL — AB (ref 65–99)
Glucose-Capillary: 196 mg/dL — ABNORMAL HIGH (ref 65–99)
Glucose-Capillary: 178 mg/dL — ABNORMAL HIGH (ref 65–99)

## 2015-11-08 LAB — RENAL FUNCTION PANEL
Albumin: 3.5 g/dL (ref 3.5–5.0)
Anion gap: 5 (ref 5–15)
BUN: 34 mg/dL — AB (ref 6–20)
CHLORIDE: 105 mmol/L (ref 101–111)
CO2: 26 mmol/L (ref 22–32)
CREATININE: 3.54 mg/dL — AB (ref 0.61–1.24)
Calcium: 9.1 mg/dL (ref 8.9–10.3)
GFR calc Af Amer: 24 mL/min — ABNORMAL LOW (ref >60)
GFR calc non Af Amer: 20 mL/min — ABNORMAL LOW (ref >60)
GLUCOSE: 179 mg/dL — AB (ref 65–99)
POTASSIUM: 4.1 mmol/L (ref 3.5–5.1)
Phosphorus: 4.3 mg/dL (ref 2.5–4.6)
Sodium: 136 mmol/L (ref 135–145)

## 2015-11-08 NOTE — Progress Notes (Signed)
Subjective: Interval History: has no complaint , no foot pain.  Objective: Vital signs in last 24 hours: Temp:  [97.3 F (36.3 C)-98.2 F (36.8 C)] 97.9 F (36.6 C) (09/25 0452) Pulse Rate:  [88-99] 88 (09/25 0914) Resp:  [19-20] 20 (09/25 0452) BP: (161-174)/(89-98) 166/93 (09/25 0914) SpO2:  [98 %-100 %] 99 % (09/25 0452) Weight:  [108.7 kg (239 lb 9.6 oz)] 108.7 kg (239 lb 9.6 oz) (09/25 0449) Weight change:   Intake/Output from previous day: 09/24 0701 - 09/25 0700 In: 3363.8 [P.O.:920; I.V.:2443.8] Out: 3675 [Urine:3675] Intake/Output this shift: Total I/O In: 360 [P.O.:360] Out: -   General appearance: alert, cooperative, no distress and moderately obese Resp: diminished breath sounds bilaterally Cardio: S1, S2 normal and systolic murmur: holosystolic 2/6, blowing at apex GI: obese , pos bs, liver down 6 cm Extremities: PICC R UA. 1+ edema  Lab Results:  Recent Labs  11/07/15 0439  WBC 5.7  HGB 11.7*  HCT 35.4*  PLT 313   BMET:  Recent Labs  11/07/15 0438 11/08/15 0516  NA 139 136  K 4.1 4.1  CL 105 105  CO2 26 26  GLUCOSE 155* 179*  BUN 33* 34*  CREATININE 3.57* 3.54*  CALCIUM 9.3 9.1   No results for input(s): PTH in the last 72 hours. Iron Studies: No results for input(s): IRON, TIBC, TRANSFERRIN, FERRITIN in the last 72 hours.  Studies/Results: No results found.  I have reviewed the patient's current medications.  Assessment/Plan: 1 AKI vanco,  Plateau in function.  Will follow course, ? If will improve, but still likely, does not need ivf 2 HTN stop ivf 3 DM controlled 4 Foot osteo on 1 AB 5 Obesity P stop ivf, if ok, d/c in am    LOS: 6 days   Laramie Meissner L 11/08/2015,9:52 AM

## 2015-11-08 NOTE — Progress Notes (Addendum)
PROGRESS NOTE  Danny Fuller  QVZ:[id number] DOB: 31-Jan-1976 DOA: 11/02/2015 PCP: [name], MD   Brief Narrative:  Danny Fuller is a 40 y.o. 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 Sharol Given), HTN, GERD.  Patient was discharged on 9/1 and was to complete a 6 week course of invanz and vanc (see Dr. Hale Bogus last progress note).  He was sent by his PCP (Dr. Sarajane Jews) due to lab work drawn on the morning of admission which showed creat of 3.3 and vanc trough of 61.  Clinically he is asymptomatic from a renal standpoint. Nephrology was consulted.  Assessment & Plan:   Principal Problem:   ARF (acute renal failure) (HCC) Active Problems:   Type 2 diabetes mellitus with diabetic neuropathy, with long-term current use of insulin (HCC)   Essential hypertension   Diabetic foot infection ([place])   AKI -Baseline creatinine 0.8 from 10/18/2015 came in with creatinine of 4.0. -Reportedly vancomycin trough was 61 from outpatient records. Patient also on lisinopril 40 mg. -UA on 11/02/15: Appears bland. Renal ultrasound 9/19 without acute abnormality or hydronephrosis. - Nephrology consultation and follow-up appreciated. Acute kidney injury presumed related to vancomycin toxicity possibly compounded by use of lisinopril. Patient states that he had not yet started Lasix. - Continue IV fluids. Creatinine starting to gradually decrease. 4.03 > 3.8 to >3.55 -Stopped vancomycin, check vancomycin random level > decreasing 57>45. - Creatinine has not shown consistent trend towards improvement. It had plateaued in the 3.5 range couple days back but increased to 3.65 on 9/23. IV fluids were increased. Creatinine down to 3.57. Follow BMP. Continue treating with fluids and monitoring until clear trend towards improvement. Nephrology follow-up appreciated - 9/25: Discussed with nephrology, DC IV fluids and follow  BMP in a.m. and if creatinine stable or decreasing, DC home and outpatient follow-up with BMP with Dr. Lorrene Reid. - Discussed with Dr. Megan Salon, ID. Patient to have weekly labs (CBC, BMP, ESR, CRP) and results forwarded to him at [facility].  Diabetic foot infection -Left diabetic foot infection, was on vancomycin and Invanz. -Patient was followed with PCP and ID, Invanz continued. Vancomycin DC'ed d/t AKI - Discussed with Dr. Michel Bickers, ID on 11/03/2015> MSSA and Klebsiella from left foot wound> recommends completing IV ertapenem through 11/21/15 and no need to recommence vancomycin or change to other antibiotics. - - Wound care consultation appreciated and recommendations as below:  Wound type: Diabetic Foot Ulcer  Measurement: 9 cm x 5 cm Wound bed: 50% yellow/bown slough; 50% pink granulation tissue Drainage (amount, consistency, odor) Scant yellow, serosanginous; no odor Periwound: Intact Dressing procedure/placement/frequency: Santyl, gauze per Santyl order   Hypertension -Continue amlodipine, held Lasix and lisinopril. IV hydralazine as needed. Blood pressures elevated in the 170-180/80-90 range despite adding hydralazine & hence Labetalol added and increased dose to 200 MG twice a day on 9/23. Monitor.  Diabetes mellitus type 2 -Hold metformin, Lantus 10 units, SSI and are modified diet. Well controlled in the hospital.  Anemia - Stable   DVT prophylaxis: Heparin Code Status: Full Code Family Communication: Discussed with the nephew at bedside. Disposition Plan: DC home, pending consistent improvement in renal functions.  Consultants:   Nephrology  Procedures:   None  Antimicrobials:   None  Subjective Had some shaking/chills yesterday but normal temperature and not hypoglycemic. Unclear etiology. Denies any other complaints.  Objective: Vitals:   11/08/15 0449 11/08/15 0452 11/08/15 0914 11/08/15  1340  BP:  (!) 161/98 (!) 166/93 (!) 168/90  Pulse:  91 88   Resp:   20    Temp:  97.9 F (36.6 C)    TempSrc:  Oral    SpO2:  99%    Weight: 108.7 kg (239 lb 9.6 oz)     Height:        Intake/Output Summary (Last 24 hours) at 11/08/15 1351 Last data filed at 11/08/15 1342  Gross per 24 hour  Intake          3493.75 ml  Output             3750 ml  Net          -256.25 ml   Filed Weights   11/02/15 0132 11/02/15 0415 11/08/15 0449  Weight: 103.9 kg (229 lb) 105.4 kg (232 lb 6.4 oz) 108.7 kg (239 lb 9.6 oz)    Examination: General exam: Pleasant young male lying comfortably in bed. Family at bedside. Respiratory system: Clear to auscultation. Respiratory effort normal. Cardiovascular system: S1 & S2 heard, RRR. No JVD, murmurs, rubs, gallops or clicks. No pedal edema. Gastrointestinal system: Abdomen is nondistended, soft and nontender. No organomegaly or masses felt. Normal bowel sounds heard. Central nervous system: Alert and oriented. No focal neurological deficits. Extremities: Symmetric 5 x 5 power. Skin: Left lateral foot wound without acute findings. Seems to be healing well. Psychiatry: Judgement and insight appear normal. Mood & affect appropriate.   Data Reviewed: I have personally reviewed following labs and imaging studies  CBC:  Recent Labs Lab 11/01/15 2144 11/03/15 0500 11/04/15 0500 11/07/15 0439  WBC 8.6 5.4 5.0 5.7  NEUTROABS 4.9  --   --   --   HGB 12.6* 11.2* 10.9* 11.7*  HCT 37.0* 33.3* 32.8* 35.4*  MCV 85.3 85.8 85.6 86.1  PLT 273 266 265 493   Basic Metabolic Panel:  Recent Labs Lab 11/04/15 0547 11/05/15 0425 11/06/15 0422 11/07/15 0438 11/08/15 0516  NA 137 136 139 139 136  K 4.0 3.9 4.0 4.1 4.1  CL 107 105 105 105 105  CO2 _0 GLUCOSE 172* 204* 195* 155* 179*  BUN 36* 33* 30* 33* 34*  CREATININE 3.55* 3.58* 3.65* 3.57* 3.54*  CALCIUM 8.7* 8.9 9.2 9.3 9.1  PHOS 5.2* 4.6 5.2* 5.1* 4.3   GFR: Estimated Creatinine Clearance: 34.4 mL/min (by C-G formula based on SCr of 3.54 mg/dL  (H)). Liver Function Tests:  Recent Labs Lab 11/01/15 2144  11/04/15 0547 11/05/15 0425 11/06/15 0422 11/07/15 0438 11/08/15 0516  AST 28  --   --   --   --   --   --   ALT 18  --   --   --   --   --   --   ALKPHOS 69  --   --   --   --   --   --   BILITOT 1.3*  --   --   --   --   --   --   PROT 7.2  --   --   --   --   --   --   ALBUMIN 3.9  < > 3.0* 3.1* 2.9* 3.4* 3.5  < > = values in this interval not displayed. No results for input(s): LIPASE, AMYLASE in the last 168 hours. No results for input(s): AMMONIA in the last 168 hours. Coagulation Profile: No results for input(s): INR, PROTIME in the last  168 hours. Cardiac Enzymes: No results for input(s): CKTOTAL, CKMB, CKMBINDEX, TROPONINI in the last 168 hours. BNP (last 3 results) No results for input(s): PROBNP in the last 8760 hours. HbA1C: No results for input(s): HGBA1C in the last 72 hours. CBG:  Recent Labs Lab 11/07/15 1643 11/07/15 1759 11/07/15 2127 11/08/15 0813 11/08/15 1212  GLUCAP 170* 154* 157* 184* 196*   Lipid Profile: No results for input(s): CHOL, HDL, LDLCALC, TRIG, CHOLHDL, LDLDIRECT in the last 72 hours. Thyroid Function Tests: No results for input(s): TSH, T4TOTAL, FREET4, T3FREE, THYROIDAB in the last 72 hours. Anemia Panel: No results for input(s): VITAMINB12, FOLATE, FERRITIN, TIBC, IRON, RETICCTPCT in the last 72 hours. Urine analysis:    Component Value Date/Time   COLORURINE YELLOW 11/04/2015 0100   APPEARANCEUR CLEAR 11/04/2015 0100   LABSPEC 1.008 11/04/2015 0100   PHURINE 5.5 11/04/2015 0100   GLUCOSEU NEGATIVE 11/04/2015 0100   HGBUR NEGATIVE 11/04/2015 0100   HGBUR negative 04/17/2008 0929   BILIRUBINUR NEGATIVE 11/04/2015 0100   BILIRUBINUR n 04/23/2013 1350   KETONESUR NEGATIVE 11/04/2015 0100   PROTEINUR NEGATIVE 11/04/2015 0100   UROBILINOGEN 0.2 04/23/2013 1350   UROBILINOGEN 4.0 (H) 08/09/2009 0938   NITRITE NEGATIVE 11/04/2015 0100   LEUKOCYTESUR NEGATIVE  11/04/2015 0100    Radiology Studies: No results found.      Scheduled Meds: . amLODipine  10 mg Oral Daily  . collagenase   Topical Daily  . ertapenem  1 g Intravenous Q24H  . feeding supplement (GLUCERNA SHAKE)  237 mL Oral BID BM  . heparin  5,000 Units Subcutaneous Q8H  . hydrALAZINE  25 mg Oral Q8H  . insulin aspart  0-15 Units Subcutaneous TID WC  . insulin glargine  10 Units Subcutaneous QHS  . labetalol  100 mg Oral BID  . pregabalin  75 mg Oral BID   Continuous Infusions:     LOS: 6 days     HONGALGI,ANAND, MD, FACP, FHM. [facility] Pager [phone]  If 7PM-7AM, please contact night-coverage [email or web address] Password [email or web address] 11/08/2015, 1:51 PM

## 2015-11-09 DIAGNOSIS — G252 Other specified forms of tremor: Secondary | ICD-10-CM

## 2015-11-09 LAB — RENAL FUNCTION PANEL
ANION GAP: 8 (ref 5–15)
Albumin: 3.2 g/dL — ABNORMAL LOW (ref 3.5–5.0)
BUN: 40 mg/dL — ABNORMAL HIGH (ref 6–20)
CALCIUM: 9.5 mg/dL (ref 8.9–10.3)
CHLORIDE: 105 mmol/L (ref 101–111)
CO2: 24 mmol/L (ref 22–32)
CREATININE: 3.47 mg/dL — AB (ref 0.61–1.24)
GFR, EST AFRICAN AMERICAN: 24 mL/min — AB (ref >60)
GFR, EST NON AFRICAN AMERICAN: 21 mL/min — AB (ref >60)
Glucose, Bld: 182 mg/dL — ABNORMAL HIGH (ref 65–99)
Phosphorus: 3.6 mg/dL (ref 2.5–4.6)
Potassium: 4.8 mmol/L (ref 3.5–5.1)
SODIUM: 137 mmol/L (ref 135–145)

## 2015-11-09 LAB — GLUCOSE, CAPILLARY
GLUCOSE-CAPILLARY: 135 mg/dL — AB (ref 65–99)
GLUCOSE-CAPILLARY: 203 mg/dL — AB (ref 65–99)
Glucose-Capillary: 174 mg/dL — ABNORMAL HIGH (ref 65–99)
Glucose-Capillary: 197 mg/dL — ABNORMAL HIGH (ref 65–99)

## 2015-11-09 MED ORDER — SODIUM CHLORIDE 0.9 % IV SOLN
500.0000 mg | INTRAVENOUS | Status: DC
  Administered 2015-11-09 – 2015-11-10 (×2): 0.5 g via INTRAVENOUS
  Filled 2015-11-09 (×2): qty 0.5

## 2015-11-09 MED ORDER — PREGABALIN 25 MG PO CAPS
25.0000 mg | ORAL_CAPSULE | Freq: Two times a day (BID) | ORAL | Status: DC
  Administered 2015-11-09: 25 mg via ORAL
  Filled 2015-11-09: qty 1

## 2015-11-09 NOTE — Progress Notes (Signed)
Subjective: Interval History: has no complaint, discussed spells.  Objective: Vital signs in last 24 hours: Temp:  [97.8 F (36.6 C)-98.1 F (36.7 C)] 98.1 F (36.7 C) (09/26 0553) Pulse Rate:  [90-94] 90 (09/26 0854) Resp:  [16-18] 16 (09/26 0553) BP: (147-172)/(82-90) 147/82 (09/26 0854) SpO2:  [97 %-100 %] 97 % (09/26 0553) Weight:  [108.9 kg (240 lb)] 108.9 kg (240 lb) (09/26 0553) Weight change: 0.181 kg (6.4 oz)  Intake/Output from previous day: 09/25 0701 - 09/26 0700 In: 1565 [P.O.:1515; IV Piggyback:50] Out: 2725 [Urine:2725] Intake/Output this shift: No intake/output data recorded.  General appearance: alert, cooperative and morbidly obese Resp: clear to auscultation bilaterally Cardio: S1, S2 normal and systolic murmur: holosystolic 2/6, blowing at apex GI: obese, soft, nontender Extremities: PICC RUA,  dressing on foot  Lab Results:  Recent Labs  11/07/15 0439  WBC 5.7  HGB 11.7*  HCT 35.4*  PLT 313   BMET:  Recent Labs  11/08/15 0516 11/09/15 0500  NA 136 137  K 4.1 4.8  CL 105 105  CO2 26 24  GLUCOSE 179* 182*  BUN 34* 40*  CREATININE 3.54* 3.47*  CALCIUM 9.1 9.5   No results for input(s): PTH in the last 72 hours. Iron Studies: No results for input(s): IRON, TIBC, TRANSFERRIN, FERRITIN in the last 72 hours.  Studies/Results: No results found.  I have reviewed the patient's current medications.  Assessment/Plan: 1 AKI no change. K/acid/base ok. Nonoliguric, when will improve or if uncertain 2 DM controlled 3 Cellulitis/osteo on ab 4 HTN controlled 5 Spells of shaking ? Drug toxic P  meds changed, follow Cr,     LOS: 7 days   Vitali Seibert L 11/09/2015,11:51 AM

## 2015-11-09 NOTE — Progress Notes (Signed)
PROGRESS NOTE  Danny Fuller  BWI:[id number] DOB: [birth year] DOA: 11/02/2015 PCP: [name], MD   Brief Narrative:  Danny Fuller is a 40 y.o. 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 Sharol Given), HTN, GERD.  Patient was discharged on 9/1 and was to complete a 6 week course of invanz and vanc (see Dr. Hale Bogus last progress note).  He was sent by his PCP (Dr. Sarajane Jews) due to lab work drawn on the morning of admission which showed creat of 3.3 and vanc trough of 61.  Clinically he is asymptomatic from a renal standpoint. Nephrology was consulted and monitoring creatinine> plateaued> OK to DC from their perspective with close OP follow up of BMP. Being evaluated for "shaking spells" >felt to be secondary to medication side effects. If improved, DC home 9/27  Assessment & Plan:   Principal Problem:   ARF (acute renal failure) ([place]) Active Problems:   Type 2 diabetes mellitus with diabetic neuropathy, with long-term current use of insulin (HCC)   Essential hypertension   Diabetic foot infection ([place])   AKI -Baseline creatinine 0.8 from 10/18/2015 came in with creatinine of 4.0. -Reportedly vancomycin trough was 61 from outpatient records. Patient was also on lisinopril 40 mg. -UA on 11/02/15: Appears bland. Renal ultrasound 9/19 without acute abnormality or hydronephrosis. - Acute kidney injury presumed related to vancomycin toxicity possibly compounded by use of lisinopril. Patient states that he had not yet started Lasix. - Treated initially with IV fluids. Creatinine starting to gradually decrease. 4.03 > 3.8 to >3.55 and then has plateaued in the 3.5 range. IV fluids discontinued 9/25. Creatinine stable and cleared for DC from nephrology standpoint. - outpatient follow-up with with Dr. Lorrene Reid in 2 weeks. Home health RN to draw labs indicated below and BMP results can be forwarded to Dr.  Lorrene Reid. - Discussed with Dr. Megan Salon, ID on 9/25. Patient to have weekly labs (CBC, BMP, ESR, CRP) and results forwarded to him at [facility].  Diabetic foot infection -Left diabetic foot infection, was on vancomycin and Invanz. -Patient was followed with PCP and ID, Invanz continued. Vancomycin DC'ed d/t AKI - Discussed with Dr. Michel Bickers, ID on 11/03/2015> MSSA and Klebsiella from left foot wound> recommends completing IV ertapenem through 11/21/15 (dose reduced/adjusted to renal function on 9/26) and no need to recommence vancomycin or change to other antibiotics. - - Wound care consultation appreciated and recommendations as below:  Wound type: Diabetic Foot Ulcer  Measurement: 9 cm x 5 cm Wound bed: 50% yellow/bown slough; 50% pink granulation tissue Drainage (amount, consistency, odor) Scant yellow, serosanginous; no odor Periwound: Intact Dressing procedure/placement/frequency: Santyl, gauze per Santyl order   Hypertension -Continue amlodipine, held Lasix and lisinopril. IV hydralazine as needed. Blood pressures elevated in the 170-180/80-90 range despite adding hydralazine & hence Labetalol added and increased dose to 200 MG twice a day on 9/23. Monitor. Better controlled.  Diabetes mellitus type 2 -Hold metformin, Lantus 10 units, SSI and are modified diet. Well controlled in the hospital.  Anemia - Stable  Shaking spells/? Tremors - Unclear etiology.? Secondary to medications i.e. Lyrica in the context of renal insufficiency. Do not seem like seizures. Discussed with neurologist on-call and nephrologist. Discontinued Lyrica and ertapenem dose was reduced and adjusted to renal functions. Monitor overnight and if improved, DC home 9/27   DVT prophylaxis: Heparin Code Status: Full Code Family Communication: None at bedside Disposition Plan: DC home  possibly 9/27 pending improvement in tremors.  Consultants:   Nephrology  Procedures:   None  Antimicrobials:    None  Subjective Patient gives history of "shaking"-for the last 4-5 days, has up to 3-4 spells per day, begins with twitches here and they're followed by involuntary shaking of all 4 extremities and the chattering without associated fever or low CBG, cannot control them. These resolve spontaneously leaving patient tired. As per pharmacy review, had been changed from Neurontin to Lyrica during previous admission.  Objective: Vitals:   11/08/15 1412 11/08/15 1413 11/09/15 0553 11/09/15 0854  BP: (!) 151/87 (!) 151/87 (!) 172/88 (!) 147/82  Pulse: 94 94 90 90  Resp: _0 Temp: 97.8 F (36.6 C) 97.8 F (36.6 C) 98.1 F (36.7 C)   TempSrc: Oral Oral    SpO2: 100% 100% 97%   Weight:   108.9 kg (240 lb)   Height:        Intake/Output Summary (Last 24 hours) at 11/09/15 1416 Last data filed at 11/09/15 0631  Gross per 24 hour  Intake             1130 ml  Output             1425 ml  Net             -295 ml   Filed Weights   11/02/15 0415 11/08/15 0449 11/09/15 0553  Weight: 105.4 kg (232 lb 6.4 oz) 108.7 kg (239 lb 9.6 oz) 108.9 kg (240 lb)    Examination: General exam: Pleasant young male sitting up comfortably in bed. Respiratory system: Clear to auscultation. Respiratory effort normal. Cardiovascular system: S1 & S2 heard, RRR. No JVD, murmurs, rubs, gallops or clicks. No pedal edema. Gastrointestinal system: Abdomen is nondistended, soft and nontender. No organomegaly or masses felt. Normal bowel sounds heard. Central nervous system: Alert and oriented. No focal neurological deficits. Extremities: Symmetric 5 x 5 power. Skin: Left lateral foot wound without acute findings. Seems to be healing well. Psychiatry: Judgement and insight appear normal. Mood & affect appropriate.   Data Reviewed: I have personally reviewed following labs and imaging studies  CBC:  Recent Labs Lab 11/03/15 0500 11/04/15 0500 11/07/15 0439  WBC 5.4 5.0 5.7  HGB 11.2* 10.9* 11.7*   HCT 33.3* 32.8* 35.4*  MCV 85.8 85.6 86.1  PLT 266 265 696   Basic Metabolic Panel:  Recent Labs Lab 11/05/15 0425 11/06/15 0422 11/07/15 0438 11/08/15 0516 11/09/15 0500  NA 136 139 139 136 137  K 3.9 4.0 4.1 4.1 4.8  CL 105 105 105 105 105  CO2 _1 GLUCOSE 204* 195* 155* 179* 182*  BUN 33* 30* 33* 34* 40*  CREATININE 3.58* 3.65* 3.57* 3.54* 3.47*  CALCIUM 8.9 9.2 9.3 9.1 9.5  PHOS 4.6 5.2* 5.1* 4.3 3.6   GFR: Estimated Creatinine Clearance: 35.1 mL/min (by C-G formula based on SCr of 3.47 mg/dL (H)). Liver Function Tests:  Recent Labs Lab 11/05/15 0425 11/06/15 0422 11/07/15 0438 11/08/15 0516 11/09/15 0500  ALBUMIN 3.1* 2.9* 3.4* 3.5 3.2*   No results for input(s): LIPASE, AMYLASE in the last 168 hours. No results for input(s): AMMONIA in the last 168 hours. Coagulation Profile: No results for input(s): INR, PROTIME in the last 168 hours. Cardiac Enzymes: No results for input(s): CKTOTAL, CKMB, CKMBINDEX, TROPONINI in the last 168 hours. BNP (last 3 results) No results for input(s): PROBNP in the last 8760 hours. HbA1C: No  results for input(s): HGBA1C in the last 72 hours. CBG:  Recent Labs Lab 11/08/15 1212 11/08/15 1735 11/08/15 2114 11/09/15 0813 11/09/15 1143  GLUCAP 196* 178* 217* 135* 174*   Lipid Profile: No results for input(s): CHOL, HDL, LDLCALC, TRIG, CHOLHDL, LDLDIRECT in the last 72 hours. Thyroid Function Tests: No results for input(s): TSH, T4TOTAL, FREET4, T3FREE, THYROIDAB in the last 72 hours. Anemia Panel: No results for input(s): VITAMINB12, FOLATE, FERRITIN, TIBC, IRON, RETICCTPCT in the last 72 hours. Urine analysis:    Component Value Date/Time   COLORURINE YELLOW 11/04/2015 0100   APPEARANCEUR CLEAR 11/04/2015 0100   LABSPEC 1.008 11/04/2015 0100   PHURINE 5.5 11/04/2015 0100   GLUCOSEU NEGATIVE 11/04/2015 0100   HGBUR NEGATIVE 11/04/2015 0100   HGBUR negative 04/17/2008 0929   BILIRUBINUR NEGATIVE  11/04/2015 0100   BILIRUBINUR n 04/23/2013 1350   KETONESUR NEGATIVE 11/04/2015 0100   PROTEINUR NEGATIVE 11/04/2015 0100   UROBILINOGEN 0.2 04/23/2013 1350   UROBILINOGEN 4.0 (H) 08/09/2009 0938   NITRITE NEGATIVE 11/04/2015 0100   LEUKOCYTESUR NEGATIVE 11/04/2015 0100    Radiology Studies: No results found.      Scheduled Meds: . amLODipine  10 mg Oral Daily  . collagenase   Topical Daily  . ertapenem  500 mg Intravenous Q24H  . feeding supplement (GLUCERNA SHAKE)  237 mL Oral BID BM  . heparin  5,000 Units Subcutaneous Q8H  . hydrALAZINE  25 mg Oral Q8H  . insulin aspart  0-15 Units Subcutaneous TID WC  . insulin glargine  10 Units Subcutaneous QHS  . labetalol  100 mg Oral BID   Continuous Infusions:     LOS: 7 days     Timathy Newberry, MD, FACP, FHM. [facility] Pager [phone]  If 7PM-7AM, please contact night-coverage [email or web address] Password [email or web address] 11/09/2015, 2:16 PM

## 2015-11-10 DIAGNOSIS — N179 Acute kidney failure, unspecified: Secondary | ICD-10-CM

## 2015-11-10 LAB — RENAL FUNCTION PANEL
ALBUMIN: 3.4 g/dL — AB (ref 3.5–5.0)
ANION GAP: 11 (ref 5–15)
BUN: 41 mg/dL — ABNORMAL HIGH (ref 6–20)
CALCIUM: 9.6 mg/dL (ref 8.9–10.3)
CO2: 27 mmol/L (ref 22–32)
Chloride: 99 mmol/L — ABNORMAL LOW (ref 101–111)
Creatinine, Ser: 3.64 mg/dL — ABNORMAL HIGH (ref 0.61–1.24)
GFR calc Af Amer: 23 mL/min — ABNORMAL LOW (ref >60)
GFR, EST NON AFRICAN AMERICAN: 20 mL/min — AB (ref >60)
Glucose, Bld: 236 mg/dL — ABNORMAL HIGH (ref 65–99)
PHOSPHORUS: 4.5 mg/dL (ref 2.5–4.6)
POTASSIUM: 4.5 mmol/L (ref 3.5–5.1)
Sodium: 137 mmol/L (ref 135–145)

## 2015-11-10 LAB — GLUCOSE, CAPILLARY
GLUCOSE-CAPILLARY: 224 mg/dL — AB (ref 65–99)
Glucose-Capillary: 189 mg/dL — ABNORMAL HIGH (ref 65–99)

## 2015-11-10 MED ORDER — LABETALOL HCL 200 MG PO TABS: 200.0000 mg | ORAL_TABLET | Freq: Two times a day (BID) | ORAL | 0 refills | Status: DC

## 2015-11-10 MED ORDER — OXYCODONE HCL 10 MG PO TABS: 10.0000 mg | ORAL_TABLET | Freq: Four times a day (QID) | ORAL | 0 refills | Status: DC | PRN

## 2015-11-10 MED ORDER — GLUCERNA SHAKE PO LIQD: 237.0000 mL | Freq: Two times a day (BID) | ORAL | 0 refills | Status: DC

## 2015-11-10 MED ORDER — HEPARIN SOD (PORK) LOCK FLUSH 100 UNIT/ML IV SOLN
250.0000 [IU] | INTRAVENOUS | Status: AC | PRN
  Administered 2015-11-10: 250 [IU]

## 2015-11-10 MED ORDER — HYDRALAZINE HCL 25 MG PO TABS: 25.0000 mg | ORAL_TABLET | Freq: Three times a day (TID) | ORAL | 0 refills | Status: DC

## 2015-11-10 MED ORDER — AMLODIPINE BESYLATE 10 MG PO TABS: 10.0000 mg | ORAL_TABLET | Freq: Every day | ORAL | 1 refills | Status: DC

## 2015-11-10 MED ORDER — ERTAPENEM SODIUM 1 G IJ SOLR: 500.0000 mg | INTRAMUSCULAR | 0 refills | Status: DC

## 2015-11-10 NOTE — Discharge Summary (Addendum)
Physician Discharge Summary  Danny Fuller JJO:[id number] DOB: 03/15/1975 DOA: 11/02/2015  PCP: [name], MD  Admit date: 11/02/2015 Discharge date: 11/10/2015  Admitted From: Home Disposition:  Home  Recommendations for Outpatient Follow-up:  1. Follow up with PCP in 1-2 weeks 2. Please obtain ESR, CRP, BMP and CBC on 11/12/15, 11/19/15 and 11/26/15 3. Please follow up on the following pending results:  Home Health:YES Equipment/Devices:wound care and antibiotics  Discharge Condition:Stable CODE STATUS:FULL Diet recommendation: Heart Healthy / Carb Modified   Brief/Interim Summary: 40 y.o.malewith medical history significant of DM2 with left diabetic foot infection and osteomyelitis s/p left foot ray amputation of 5th toe (Dr. Sharol Given), HTN, GERD. Patient was discharged on 9/1 and was to complete a 6 week course of invanz and vanc (see Dr. Hale Bogus last progress note). He was sent by his PCP (Dr. Sarajane Jews) due to lab work drawn on the morning of admission which showed creat of 3.3 and vanc trough of 61. Clinically he is asymptomatic from a renal standpoint. Nephrology was consulted and monitoring creatinine> plateaued> OK to DC from their perspective with close OP follow up of BMP. Being evaluated for "shaking spells" >felt to be secondary to medication side effects.  Discharge Diagnoses:  AKI -Baseline creatinine 0.8 from 10/18/2015 came in with creatinine of 4.0. -Reportedly vancomycin trough was 61 from outpatient records. Patient was also on lisinopril 40 mg. -UA on 11/02/15: Appears bland. Renal ultrasound 9/19 without acute abnormality or hydronephrosis. - Acute kidney injury presumed related to vancomycin toxicity possibly compounded by use of lisinopril. Patient states that he had not yet started Lasix. - Treated initially with IV fluids. Creatinine starting to gradually decrease. 4.03 > 3.8 to >3.55 and then has plateaued in the 3.5 range. IV fluids discontinued 9/25.  Creatinine stable and cleared for DC from nephrology standpoint. - outpatient follow-up with with Dr. Lorrene Reid in 2 weeks. Home health RN to draw labs indicated below and BMP results can be forwarded to Dr. Lorrene Reid. - Discussed with Dr. Megan Salon, ID on 9/25. Patient to have weekly labs (CBC, BMP, ESR, CRP) and results forwarded to him at [facility]. -serum creatinine 3.64 of day of d/c--discussed with Dr. Levander Campion to d/c  Diabetic foot infection -Left diabetic foot infection, was on vancomycin and Invanz. -Patient was followed with PCP and ID, Invanz continued. Vancomycin DC'ed d/t AKI - Discussed with Dr. Michel Bickers, ID on 11/03/2015> MSSA and Klebsiella from left foot wound> recommends completing IV ertapenem through 11/21/15 (dose reduced/adjusted to renal function on 9/26) and no need to recommence vancomycin or change to other antibiotics. - - Wound care consultation appreciated and recommendations as below:  Wound type: Diabetic Foot Ulcer  Measurement: 9 cm x 5 cm Wound bed: 50% yellow/bown slough; 50% pink granulation tissue Drainage (amount, consistency, odor) Scant yellow, serosanginous; no odor Periwound: Intact Dressing procedure/placement/frequency: Santyl, gauze per Santyl order--Apply to Left foot wound.Cover with lightly saline moistened gauze, then dry gauze.Secure in place with Kerlex or other type of wrap dressing.   Hypertension -Continue amlodipine, held Lasix and lisinopril. IV hydralazine as needed. Blood pressures elevated in the 170-180/80-90 range despite adding hydralazine & hence Labetalol added and increased dose to 200 MG twice a day on 9/23. Monitor. Better controlled.  Diabetes mellitus type 2 -Hold metformin, Lantus 10 units, SSI and are modified diet. Well controlled in the hospital. -home with previous dose of lantus and ssi -d/c metfomin altogether until renal function improves  Anemia - Stable  Shaking spells/? Tremors -  Unclear etiology.?  Secondary to medications i.e. Lyrica in the context of renal insufficiency. Do not seem like seizures. Discussed with neurologist on-call and nephrologist. Discontinued Lyrica and ertapenem dose was reduced and adjusted to renal functions.  -tremors continue to improve off of lyrica   Discharge Instructions     Medication List    STOP taking these medications   ertapenem 1 g in sodium chloride 0.9 % 50 mL Replaced by:  ertapenem 0.5 g in sodium chloride 0.9 % 50 mL   furosemide 20 MG tablet Commonly known as:  LASIX   lactobacilus acidophilus & bulgar Tabs chewable tablet   lisinopril 20 MG tablet Commonly known as:  PRINIVIL,ZESTRIL   metFORMIN 1000 MG tablet Commonly known as:  GLUCOPHAGE   pregabalin 75 MG capsule Commonly known as:  LYRICA     TAKE these medications   amLODipine 10 MG tablet Commonly known as:  NORVASC Take 1 tablet (10 mg total) by mouth daily. What changed:  medication strength  how much to take   collagenase ointment Commonly known as:  SANTYL Apply 1 application topically daily. Wash left foot with soap and water daily dry the foot and then apply Santyl to a dry gauze dressing apply Ace wrap continue nonweightbearing on the left foot change dressing daily.   ertapenem 0.5 g in sodium chloride 0.9 % 50 mL Inject 0.5 g into the vein daily. Last dose on 11/21/15 Replaces:  ertapenem 1 g in sodium chloride 0.9 % 50 mL   feeding supplement (GLUCERNA SHAKE) Liqd Take 237 mLs by mouth 2 (two) times daily between meals.   glucose blood test strip Commonly known as:  ONETOUCH VERIO Use to check blood sugar 1 time per day.   hydrALAZINE 25 MG tablet Commonly known as:  APRESOLINE Take 1 tablet (25 mg total) by mouth every 8 (eight) hours.   insulin aspart 100 UNIT/ML FlexPen Commonly known as:  NOVOLOG FLEXPEN Before each meal 3 times a day, 140-199 - 2 units, 200-250 - 4 units, 251-299 - 6 units,  300-349 - 8 units,  350 or above 10 units.  Insulin PEN if approved, provide syringes and needles if needed.   Insulin Glargine 100 UNIT/ML Solostar Pen Commonly known as:  LANTUS Inject 20 Units into the skin daily at 10 pm.   labetalol 200 MG tablet Commonly known as:  NORMODYNE Take 1 tablet (200 mg total) by mouth 2 (two) times daily.   Oxycodone HCl 10 MG Tabs Take 1 tablet (10 mg total) by mouth every 6 (six) hours as needed. What changed:  reasons to take this   Pen Needles 31G X 6 MM Misc For a month supply      Follow-up Information    [place] .   Why:  you will resume services with [facility] for home health and IV antibiotics.  Contact information: [address] [place] [address] [phone]          No Known Allergies  Consultations:  nephrology   Procedures/Studies: US Renal  Result Date: 11/02/2015 CLINICAL DATA:  Acute kidney injury. EXAM: RENAL / URINARY TRACT ULTRASOUND COMPLETE COMPARISON:  None. FINDINGS: Right Kidney: Length: 14.3 cm. Echogenicity within normal limits. No mass or hydronephrosis visualized. Left Kidney: Length: 15.1 cm. Normal echogenicity without hydronephrosis. Anechoic cyst in the left kidney lower pole measures up to 2.5 cm with acoustic enhancement. Bladder: Appears normal for degree of bladder distention. IMPRESSION: No acute abnormality.  No hydronephrosis. Left  renal cyst. Electronically Signed   By: Markus Daft M.D.   On: 11/02/2015 17:00   Dg Chest Port 1 View  Result Date: 11/02/2015 CLINICAL DATA:  Status post PICC line placement EXAM: PORTABLE CHEST 1 VIEW COMPARISON:  10/14/2015 FINDINGS: Cardiac shadow is stable. The lungs are well aerated bilaterally. A right-sided PICC line is noted in the distal superior vena cava. The bony structures are within normal limits. IMPRESSION: PICC line in satisfactory position. Electronically Signed   By: Inez Catalina M.D.   On: 11/02/2015 07:36   Dg Chest Port 1 View  Result Date: 10/14/2015 CLINICAL  DATA:  Line placement EXAM: PORTABLE CHEST 1 VIEW COMPARISON:  02/08/2012 FINDINGS: Right arm PICC tip in the mid right atrium. Recommend withdrawal 5 cm. Lungs remain clear without infiltrate or effusion. IMPRESSION: PICC tip in the mid right atrium, recommend withdrawal 5 cm. Electronically Signed   By: Franchot Gallo M.D.   On: 10/14/2015 11:06        Discharge Exam: Vitals:   11/09/15 2239 11/10/15 0509  BP: (!) 168/92 (!) 146/90  Pulse: (!) 101 90  Resp: 18 18  Temp: 97.8 F (36.6 C) 97.7 F (36.5 C)   Vitals:   11/09/15 1430 11/09/15 2239 11/10/15 0509 11/10/15 0533  BP: (!) 153/89 (!) 168/92 (!) 146/90   Pulse: 93 (!) 101 90   Resp: 19 18 18    Temp: 97.7 F (36.5 C) 97.8 F (36.6 C) 97.7 F (36.5 C)   TempSrc: Oral Oral Oral   SpO2: 100% 100% 99%   Weight:    109.5 kg (241 lb 4.8 oz)  Height:        General: Pt is alert, awake, not in acute distress Cardiovascular: RRR, S1/S2 +, no rubs, no gallops Respiratory: CTA bilaterally, no wheezing, no rhonchi Abdominal: Soft, NT, ND, bowel sounds + Extremities: no edema, no cyanosis   The results of significant diagnostics from this hospitalization (including imaging, microbiology, ancillary and laboratory) are listed below for reference.    Significant Diagnostic Studies: US Renal  Result Date: 11/02/2015 CLINICAL DATA:  Acute kidney injury. EXAM: RENAL / URINARY TRACT ULTRASOUND COMPLETE COMPARISON:  None. FINDINGS: Right Kidney: Length: 14.3 cm. Echogenicity within normal limits. No mass or hydronephrosis visualized. Left Kidney: Length: 15.1 cm. Normal echogenicity without hydronephrosis. Anechoic cyst in the left kidney lower pole measures up to 2.5 cm with acoustic enhancement. Bladder: Appears normal for degree of bladder distention. IMPRESSION: No acute abnormality.  No hydronephrosis. Left renal cyst. Electronically Signed   By: Markus Daft M.D.   On: 11/02/2015 17:00   Dg Chest Port 1 View  Result Date:  11/02/2015 CLINICAL DATA:  Status post PICC line placement EXAM: PORTABLE CHEST 1 VIEW COMPARISON:  10/14/2015 FINDINGS: Cardiac shadow is stable. The lungs are well aerated bilaterally. A right-sided PICC line is noted in the distal superior vena cava. The bony structures are within normal limits. IMPRESSION: PICC line in satisfactory position. Electronically Signed   By: Inez Catalina M.D.   On: 11/02/2015 07:36   Dg Chest Port 1 View  Result Date: 10/14/2015 CLINICAL DATA:  Line placement EXAM: PORTABLE CHEST 1 VIEW COMPARISON:  02/08/2012 FINDINGS: Right arm PICC tip in the mid right atrium. Recommend withdrawal 5 cm. Lungs remain clear without infiltrate or effusion. IMPRESSION: PICC tip in the mid right atrium, recommend withdrawal 5 cm. Electronically Signed   By: Franchot Gallo M.D.   On: 10/14/2015 11:06     Microbiology: No  results found for this or any previous visit (from the past 240 hour(s)).   Labs: Basic Metabolic Panel:  Recent Labs Lab 11/06/15 0422 11/07/15 0438 11/08/15 0516 11/09/15 0500 11/10/15 0529  NA 139 139 136 137 137  K 4.0 4.1 4.1 4.8 4.5  CL 105 105 105 105 99*  CO2 25 26 26 24 27   GLUCOSE 195* 155* 179* 182* 236*  BUN 30* 33* 34* 40* 41*  CREATININE 3.65* 3.57* 3.54* 3.47* 3.64*  CALCIUM 9.2 9.3 9.1 9.5 9.6  PHOS 5.2* 5.1* 4.3 3.6 4.5   Liver Function Tests:  Recent Labs Lab 11/06/15 0422 11/07/15 0438 11/08/15 0516 11/09/15 0500 11/10/15 0529  ALBUMIN 2.9* 3.4* 3.5 3.2* 3.4*   No results for input(s): LIPASE, AMYLASE in the last 168 hours. No results for input(s): AMMONIA in the last 168 hours. CBC:  Recent Labs Lab 11/04/15 0500 11/07/15 0439  WBC 5.0 5.7  HGB 10.9* 11.7*  HCT 32.8* 35.4*  MCV 85.6 86.1  PLT 265 313   Cardiac Enzymes: No results for input(s): CKTOTAL, CKMB, CKMBINDEX, TROPONINI in the last 168 hours. BNP: Invalid input(s): POCBNP CBG:  Recent Labs Lab 11/09/15 1143 11/09/15 1708 11/09/15 2235  11/10/15 0800 11/10/15 1204  GLUCAP 174* 197* 203* 224* 189*    Time coordinating discharge:  Greater than 30 minutes  Signed:  Udell Mazzocco, DO [facility] Pager: [phone] 11/10/2015, 12:19 PM

## 2015-11-10 NOTE — Progress Notes (Signed)
Subjective: Interval History: has no complaint.  Objective: Vital signs in last 24 hours: Temp:  [97.7 F (36.5 C)-97.8 F (36.6 C)] 97.7 F (36.5 C) (09/27 0509) Pulse Rate:  [90-101] 90 (09/27 0509) Resp:  [18-19] 18 (09/27 0509) BP: (146-168)/(89-92) 146/90 (09/27 0509) SpO2:  [99 %-100 %] 99 % (09/27 0509) Weight:  [109.5 kg (241 lb 4.8 oz)] 109.5 kg (241 lb 4.8 oz) (09/27 0533) Weight change: 0.59 kg (1 lb 4.8 oz)  Intake/Output from previous day: 09/26 0701 - 09/27 0700 In: 290 [P.O.:240; IV Piggyback:50] Out: 925 [Urine:925] Intake/Output this shift: No intake/output data recorded.  General appearance: alert, cooperative and moderately obese Resp: clear to auscultation bilaterally Cardio: S1, S2 normal and systolic murmur: holosystolic 2/6, blowing at apex GI: obese, striae, pos bs ,soft, Extremities: PICC RUA  Lab Results: No results for input(s): WBC, HGB, HCT, PLT in the last 72 hours. BMET:  Recent Labs  11/09/15 0500 11/10/15 0529  NA 137 137  K 4.8 4.5  CL 105 99*  CO2 24 27  GLUCOSE 182* 236*  BUN 40* 41*  CREATININE 3.47* 3.64*  CALCIUM 9.5 9.6   No results for input(s): PTH in the last 72 hours. Iron Studies: No results for input(s): IRON, TIBC, TRANSFERRIN, FERRITIN in the last 72 hours.  Studies/Results: No results found.  I have reviewed the patient's current medications.  Assessment/Plan: 1 AKI still no improvement GFR, but will watch. Acid/base K ok.  If improves,will require dosing changes.  Can go home and Home Health can draw Renal profile on Fri and in 1 wk.  Will set up with Dr. Lorrene Reid in 2 wk 2 DM controlled 3 Osteo on AB 4 Obesity 5 spells better P ok to d/c as above,  Will make outpatient arrangements   LOS: 8 days   Altheria Shadoan L 11/10/2015,9:07 AM

## 2015-11-10 NOTE — Progress Notes (Signed)
Patient discharge teaching given, including activity, diet, follow-up appoints, and medications. Patient verbalized understanding of all discharge instructions. IV access was d/c'd. Vitals are stable. Skin is intact except as charted in most recent assessments. Pt to be escorted out by NT, to be driven home by family. 

## 2015-11-11 ENCOUNTER — Ambulatory Visit (INDEPENDENT_AMBULATORY_CARE_PROVIDER_SITE_OTHER): Payer: BC Managed Care – PPO | Admitting: Internal Medicine

## 2015-11-11 ENCOUNTER — Encounter: Payer: Self-pay | Admitting: Internal Medicine

## 2015-11-11 DIAGNOSIS — E1169 Type 2 diabetes mellitus with other specified complication: Secondary | ICD-10-CM

## 2015-11-11 DIAGNOSIS — G629 Polyneuropathy, unspecified: Secondary | ICD-10-CM | POA: Diagnosis not present

## 2015-11-11 DIAGNOSIS — L089 Local infection of the skin and subcutaneous tissue, unspecified: Secondary | ICD-10-CM | POA: Diagnosis not present

## 2015-11-11 DIAGNOSIS — N189 Chronic kidney disease, unspecified: Secondary | ICD-10-CM

## 2015-11-11 DIAGNOSIS — N179 Acute kidney failure, unspecified: Secondary | ICD-10-CM | POA: Diagnosis not present

## 2015-11-11 DIAGNOSIS — Z23 Encounter for immunization: Secondary | ICD-10-CM | POA: Diagnosis not present

## 2015-11-11 DIAGNOSIS — E11628 Type 2 diabetes mellitus with other skin complications: Secondary | ICD-10-CM

## 2015-11-11 MED ORDER — AMOXICILLIN-POT CLAVULANATE 500-125 MG PO TABS: 1.0000 | ORAL_TABLET | Freq: Two times a day (BID) | ORAL | Status: DC

## 2015-11-11 MED ORDER — SODIUM CHLORIDE 0.9 % IV SOLN: 500.0000 mg | INTRAVENOUS | 0 refills | Status: AC

## 2015-11-11 MED ORDER — AMOXICILLIN-POT CLAVULANATE 500-125 MG PO TABS: 1.0000 | ORAL_TABLET | Freq: Two times a day (BID) | ORAL | 0 refills | Status: AC

## 2015-11-11 NOTE — Progress Notes (Signed)
Verbal order from Dr. Michel Bickers for IV antibiotic to complete on 11/21/15 and remove PICC.  Phone call to [facility] pharmacy.  Jackelyn Poling,  RN given the verbal order from Dr. Megan Salon.  Debbie, RN verbalized back the order.

## 2015-11-11 NOTE — Progress Notes (Signed)
[facility] for Infectious [facility]  Patient Active Problem List   Diagnosis Date Noted  . Diabetic foot infection ([place]) 10/11/2015    Priority: High  . Acute kidney injury superimposed on chronic kidney disease ([place]) 10/11/2015    Priority: High  . Peripheral neuropathy ([place]) 11/11/2015  . Asthma 07/23/2015  . Chronic headache 07/23/2015  . Erectile disorder due to medical condition in male 04/23/2015  . BMI 33.0-33.9,adult 04/21/2012  . GERD 04/21/2008  . Type 2 diabetes mellitus with diabetic neuropathy, with long-term current use of insulin ([place]) 11/21/2006  . Essential hypertension 11/21/2006  . LUMBAR STRAIN 11/21/2006    Patient's Medications  New Prescriptions   AMOXICILLIN-CLAVULANATE (AUGMENTIN) 500-125 MG TABLET    Take 1 tablet (500 mg total) by mouth 2 (two) times daily after a meal.  Previous Medications   AMLODIPINE (NORVASC) 10 MG TABLET    Take 1 tablet (10 mg total) by mouth daily.   COLLAGENASE (SANTYL) OINTMENT    Apply 1 application topically daily. Wash left foot with soap and water daily dry the foot and then apply Santyl to a dry gauze dressing apply Ace wrap continue nonweightbearing on the left foot change dressing daily.   FEEDING SUPPLEMENT, GLUCERNA SHAKE, (GLUCERNA SHAKE) LIQD    Take 237 mLs by mouth 2 (two) times daily between meals.   GLUCOSE BLOOD (ONETOUCH VERIO) TEST STRIP    Use to check blood sugar 1 time per day.   HYDRALAZINE (APRESOLINE) 25 MG TABLET    Take 1 tablet (25 mg total) by mouth every 8 (eight) hours.   INSULIN ASPART (NOVOLOG FLEXPEN) 100 UNIT/ML FLEXPEN    Before each meal 3 times a day, 140-199 - 2 units, 200-250 - 4 units, 251-299 - 6 units,  300-349 - 8 units,  350 or above 10 units. Insulin PEN if approved, provide syringes and needles if needed.   INSULIN GLARGINE (LANTUS) 100 UNIT/ML SOLOSTAR PEN    Inject 20 Units into the skin daily at 10 pm.   INSULIN PEN NEEDLE (PEN NEEDLES) 31G X 6 MM MISC    For a month  supply   LABETALOL (NORMODYNE) 200 MG TABLET    Take 1 tablet (200 mg total) by mouth 2 (two) times daily.   OXYCODONE HCL 10 MG TABS    Take 1 tablet (10 mg total) by mouth every 6 (six) hours as needed.  Modified Medications   Modified Medication Previous Medication   ERTAPENEM 0.5 G IN SODIUM CHLORIDE 0.9 % 50 ML ertapenem 0.5 g in sodium chloride 0.9 % 50 mL      Inject 0.5 g into the vein daily. Last dose on 11/21/15    Inject 0.5 g into the vein daily. Last dose on 11/21/15  Discontinued Medications   No medications on file    Subjective: Mr. Ocana is in for his hospital follow-up visit. He is a 40 y.o. male diabetic who developed osteomyelitis of his left foot. He underwent left fifth ray amputation and drainage of a large abscess over the dorsum of his foot on 07/26/2015. Operative cultures grew group B strep and methicillin-resistant coagulase-negative staph. He was discharged on 07/29/2015 with a plan for doxycycline and amoxicillin clavulanate for one month. He was left with a large ulcerated area over the dorsum of the foot but did well initially. However, about two months ago he began to notice increased drainage from the ulcer. The drainage has developed a very foul  odor. The wound itself started to look worse with more areas of yellow, shaggy discoloration. He was seen by Dr. Sharol Given and started on oral doxycycline. He has continued to worsen leading to admission in late August. A swab of the wound shows gram-positive cocci in pairs and gram-negative rods on Gram stain. Culture grew MSSA and Klebsiella. MRI revealed some new bony enhancement along the Lisfranc joint, especially at the base of the fourth metatarsal. He was discharged on IV vancomycin and ertapenem. His initial vancomycin trough level was normal at 15. When it was repeated on 11/01/2015 it had risen all the way to 61.3. His creatinine increased from 0.73 to 3.3 leading to readmission on 11/01/2015. His vancomycin was stopped  and he was hydrated. He was evaluated by nephrology who felt that he probably had vancomycin related nephrotoxicity along with possible contribution by furosemide and lisinopril. His creatinine rose to 3.64. He was discharged yesterday on IV ertapenem. He's had no problems tolerating his PICC or ertapenem. Overall he feels like his left foot is doing much better. The ulcer on the dorsum of his foot has decreased in size. He is having very little drainage that has no odor. He has improved sensation with less pain. He is taking 1-2 oxycodone daily for pain. When in the hospital recently his Lyrica was stopped because he was having tremors. The tremors have resolved but he feels like he has not sure what was causing them and he is worried that his pain may be worse off the Lyrica. He is passing a large amount of urine.  Review of Systems: Review of Systems  Constitutional: Positive for malaise/fatigue. Negative for chills, diaphoresis, fever and weight loss.       He gained about 20 pounds within the hospital recently.  HENT: Negative for sore throat.   Respiratory: Negative for cough, sputum production and shortness of breath.   Cardiovascular: Negative for chest pain.  Gastrointestinal: Negative for abdominal pain, diarrhea, nausea and vomiting.  Genitourinary: Negative for dysuria and frequency.  Musculoskeletal: Negative for joint pain and myalgias.  Skin: Negative for rash.  Neurological: Positive for sensory change. Negative for headaches.    Past Medical History:  Diagnosis Date  . Asthma   . Diabetes mellitus   . GERD (gastroesophageal reflux disease)   . Headache(784.0)   . Hypertension     Social History  Substance Use Topics  . Smoking status: Former Smoker    Types: Cigarettes  . Smokeless tobacco: Never Used     Comment: quit 5 days ago  . Alcohol use No    Family History  Problem Relation Age of Onset  . Arthritis    . Diabetes    . Hypertension    . Hyperlipidemia     . Stroke    . Sudden death    . Diabetes Mother   . Diabetes Sister   . Diabetes Brother     Allergies  Allergen Reactions  . Lyrica [Pregabalin] Other (See Comments)    tremors    Objective: Vitals:   11/11/15 1114  BP: (!) 174/106  Pulse: 90  Temp: 98.2 F (36.8 C)  TempSrc: Oral  Weight: 240 lb 4 oz (109 kg)  Height: 5\' 9"  (1.753 m)   Body mass index is 35.48 kg/m.  Physical Exam  Constitutional: He is oriented to person, place, and time.  He is comfortable and in no distress sitting in his wheelchair. He is accompanied by his aunt.  Cardiovascular: Normal rate  and regular rhythm.   No murmur heard. Pulmonary/Chest: Effort normal and breath sounds normal.  Abdominal: There is no tenderness.  Musculoskeletal:  He has 2+ pitting edema in both legs. The ulcer on the dorsum of his foot is much smaller. There is scant drainage and no odor. There is no surrounding cellulitis or fluctuance.  Neurological: He is alert and oriented to person, place, and time.  Skin: No rash noted.  Right arm PICC site looks good.  Psychiatric: Mood and affect normal.    Lab Results BMET    Component Value Date/Time   NA 137 11/10/2015 0529   K 4.5 11/10/2015 0529   CL 99 (L) 11/10/2015 0529   CO2 27 11/10/2015 0529   GLUCOSE 236 (H) 11/10/2015 0529   BUN 41 (H) 11/10/2015 0529   CREATININE 3.64 (H) 11/10/2015 0529   CREATININE 0.82 03/26/2015 1700   CALCIUM 9.6 11/10/2015 0529   GFRNONAA 20 (L) 11/10/2015 0529   GFRAA 23 (L) 11/10/2015 0529   Sed Rate (mm/hr)  Date Value  10/10/2015 6  07/25/2015 68 (H)   CRP (mg/dL)  Date Value  10/10/2015 0.6  07/25/2015 19.7 (H)     Problem List Items Addressed This Visit      High   Acute kidney injury superimposed on chronic kidney disease ([place])    He developed nonoliguric renal insufficiency due to vancomycin toxicity. He is due to get repeat lab work tomorrow and will follow-up with Dr. Jamal Maes.      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 He developed nephrotoxicity due to vancomycin so I held it. His most recent wound culture grew only MSSA and Klebsiella. I suspect that anaerobes are involved as well. He has now completed 32 days of total IV antibiotic therapy. He will continue it 10 more days through 11/21/2015 and then finish with 2 weeks of oral, renally adjusted Augmentin. He will follow-up with me in 6 weeks.      Relevant Medications   ertapenem 0.5 g in sodium chloride 0.9 % 50 mL   amoxicillin-clavulanate (AUGMENTIN) 500-125 MG tablet (Start on 11/22/2015)     Unprioritized   Peripheral neuropathy ([place])    He is worried that his neuropathic pain make it worse after stopping Lyrica recently because of concerns that it might be causing tremors. I suggested that he discuss this with his primary care provider Dr. Sarajane Jews, but consider restarting a low-dose if his pain worsens and seeing if the tremors come back.       Other Visit Diagnoses   None.      Michel Bickers, MD [facility] [phone] pager   [phone] cell 11/11/2015, 12:37 PM

## 2015-11-11 NOTE — Assessment & Plan Note (Signed)
He developed nonoliguric renal insufficiency due to vancomycin toxicity. He is due to get repeat lab work tomorrow and will follow-up with Dr. Jamal Maes.

## 2015-11-11 NOTE — Assessment & Plan Note (Signed)
He is improving on therapy for polymicrobial diabetic foot infection with possible osteomyelitis. He developed nephrotoxicity due to vancomycin so I held it. His most recent wound culture grew only MSSA and Klebsiella. I suspect that anaerobes are involved as well. He has now completed 32 days of total IV antibiotic therapy. He will continue it 10 more days through 11/21/2015 and then finish with 2 weeks of oral, renally adjusted Augmentin. He will follow-up with me in 6 weeks.

## 2015-11-11 NOTE — Assessment & Plan Note (Signed)
He is worried that his neuropathic pain make it worse after stopping Lyrica recently because of concerns that it might be causing tremors. I suggested that he discuss this with his primary care provider Dr. Sarajane Jews, but consider restarting a low-dose if his pain worsens and seeing if the tremors come back.

## 2015-11-19 ENCOUNTER — Encounter: Payer: Self-pay | Admitting: Internal Medicine

## 2015-12-09 ENCOUNTER — Encounter: Payer: Self-pay | Admitting: Internal Medicine

## 2015-12-21 ENCOUNTER — Ambulatory Visit (INDEPENDENT_AMBULATORY_CARE_PROVIDER_SITE_OTHER): Payer: Self-pay | Admitting: Orthopedic Surgery

## 2015-12-21 ENCOUNTER — Encounter (INDEPENDENT_AMBULATORY_CARE_PROVIDER_SITE_OTHER): Payer: Self-pay | Admitting: Orthopedic Surgery

## 2015-12-21 ENCOUNTER — Encounter: Payer: Self-pay | Admitting: Family Medicine

## 2015-12-21 VITALS — Ht 70.0 in | Wt 240.0 lb

## 2015-12-21 DIAGNOSIS — L97521 Non-pressure chronic ulcer of other part of left foot limited to breakdown of skin: Secondary | ICD-10-CM

## 2015-12-21 NOTE — Telephone Encounter (Signed)
Pt scheduled  

## 2015-12-21 NOTE — Telephone Encounter (Signed)
Can you call pt to schedule a office visit to discuss current medications?

## 2015-12-21 NOTE — Progress Notes (Signed)
Wound Care Note   Patient: Danny Fuller           Date of Birth: 01-23-1976           MRN: [id number]             PCP: [name], MD Visit Date: 12/21/2015   Assessment & Plan: Visit Diagnoses:  1. Ulcer of left foot, limited to breakdown of skin ([place])     Plan: Patient has hyper-granulation tissue with maceration around the wound is current wound dressing is not working we will switch him over to a Vive wear compression sock a sample was applied today was given a prescription to go to [facility] for a 15-20 mm graduated compression sock to be worn daily washes foot daily with soap and water and where the sock around-the-clock.   Follow-Up Instructions: Return in about 2 weeks (around 01/04/2016).  Orders:  No orders of the defined types were placed in this encounter.  No orders of the defined types were placed in this encounter.     Procedures: No notes on file   Clinical Data: No additional findings.   No images are attached to the encounter.   Subjective: Chief Complaint  Patient presents with  . Left Foot - Follow-up  . 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 He has been seeing infectious disease with Dr. Megan Salon. He was given IV antibiotics vancomycin with ertapenem and put on oral antibiotics. He completed oral antibiotics 2-3 weeks ago. There is granulation tissue. Patient is doing dry dressing changes. There is maceration, there is minimal drainage. He is wanting to know if additional oral antibiotics are needed. Not currently taking any medications for pain, he ran out of his oxycodone and is unable to take lyrica due to tremors.     Review of Systems    Objective: Vital Signs: Ht 5\' 10"  (1.778 m)   Wt 240 lb (108.9 kg)   BMI 34.44 kg/m   Physical Exam: Patient is alert oriented no adenopathy  well-dressed normal affect normal respiratory effort.  Patient has an antalgic gait. The wound has hyper-granulation tissue this was touched with silver nitrate there was significant amount of maceration around the wound edges. There is no cellulitis no odor no drainage no signs of infection patient has completed both IV and oral antibiotics. The wound is 35 x 25 mm with proud hyper granulation tissue this was touched with silver nitrate.  Specialty Comments: No specialty comments available.   PMFS History: Patient Active Problem List   Diagnosis Date Noted  . Peripheral neuropathy ([place]) 11/11/2015  . Diabetic foot infection ([place]) 10/11/2015  . Acute kidney injury superimposed on chronic kidney disease ([place]) 10/11/2015  . Asthma 07/23/2015  . Chronic headache 07/23/2015  . Erectile disorder due to medical condition in male 04/23/2015  . BMI 33.0-33.9,adult 04/21/2012  . GERD 04/21/2008  . Type 2 diabetes mellitus with diabetic neuropathy, with long-term current use of insulin ([place]) 11/21/2006  . Essential hypertension 11/21/2006  . LUMBAR STRAIN 11/21/2006   Past Medical History:  Diagnosis Date  . Asthma   . Diabetes mellitus   . GERD (gastroesophageal reflux disease)   . Headache(784.0)   . Hypertension     Family History  Problem Relation Age of Onset  . Arthritis    . Diabetes    .  Hypertension    . Hyperlipidemia    . Stroke    . Sudden death    . Diabetes Mother   . Diabetes Sister   . Diabetes Brother    Past Surgical History:  Procedure Laterality Date  . AMPUTATION Left 07/26/2015   Procedure: AMPUTATION LEFT FIFTH RAY;  Surgeon: [name], MD;  Location: [facility];  Service: Orthopedics;  Laterality: Left;  . bilateral hip pins placed     Social History   Occupational History  . Not on file.   Social History Main Topics  . Smoking status: Former Smoker    Types: Cigarettes  . Smokeless tobacco: Never Used     Comment: quit 5 days ago  . Alcohol use No   . Drug use: No  . Sexual activity: Not on file

## 2015-12-22 ENCOUNTER — Ambulatory Visit (INDEPENDENT_AMBULATORY_CARE_PROVIDER_SITE_OTHER): Payer: BC Managed Care – PPO | Admitting: Family Medicine

## 2015-12-22 ENCOUNTER — Encounter: Payer: Self-pay | Admitting: Family Medicine

## 2015-12-22 VITALS — BP 167/99 | HR 92 | Temp 98.4°F | Ht 70.0 in | Wt 231.0 lb

## 2015-12-22 DIAGNOSIS — I10 Essential (primary) hypertension: Secondary | ICD-10-CM

## 2015-12-22 MED ORDER — HYDRALAZINE HCL 25 MG PO TABS: 25.0000 mg | ORAL_TABLET | Freq: Three times a day (TID) | ORAL | 0 refills | Status: DC

## 2015-12-22 MED ORDER — CLONIDINE HCL 0.1 MG PO TABS
0.1000 mg | ORAL_TABLET | Freq: Two times a day (BID) | ORAL | 3 refills | Status: DC
Start: 2015-12-22 — End: 2016-01-19

## 2015-12-22 NOTE — Progress Notes (Signed)
Pre visit review using our clinic review tool, if applicable. No additional management support is needed unless otherwise documented below in the visit note. 

## 2015-12-22 NOTE — Progress Notes (Signed)
   Subjective:    Patient ID: Danny Fuller, male    DOB: [birth year], 40 y.o.   MRN: [id number]  HPI Here to follow up on HTN. When he saw Dr. Lorrene Reid last month his BP was 170/100. She asked him to increase his Hydralazine 25 mg to taking 2 tabs at a time TID. He did this for 2 weeks but it made him weak and lightheaded so he stopped taking altogether. Now he feels better but his BP remains high. No headache or chest pain or SOB.    Review of Systems  Constitutional: Negative.   Respiratory: Negative.   Cardiovascular: Negative.   Neurological: Negative.        Objective:   Physical Exam  Constitutional: He appears well-developed and well-nourished.  Neck: No thyromegaly present.  Cardiovascular: Normal rate, regular rhythm, normal heart sounds and intact distal pulses.   Pulmonary/Chest: Effort normal and breath sounds normal.  Lymphadenopathy:    He has no cervical adenopathy.          Assessment & Plan:  For his HTN he will resume the Hydralazine at the original dose of 25 mg TID, and we will add Clonidine 0.1 mg BID. Recheck in a few weeks.  Danny Morale, MD

## 2015-12-28 ENCOUNTER — Ambulatory Visit (INDEPENDENT_AMBULATORY_CARE_PROVIDER_SITE_OTHER): Payer: BC Managed Care – PPO | Admitting: Internal Medicine

## 2015-12-28 ENCOUNTER — Encounter: Payer: Self-pay | Admitting: Internal Medicine

## 2015-12-28 DIAGNOSIS — L089 Local infection of the skin and subcutaneous tissue, unspecified: Secondary | ICD-10-CM | POA: Diagnosis not present

## 2015-12-28 DIAGNOSIS — E1169 Type 2 diabetes mellitus with other specified complication: Secondary | ICD-10-CM

## 2015-12-28 DIAGNOSIS — N179 Acute kidney failure, unspecified: Secondary | ICD-10-CM

## 2015-12-28 DIAGNOSIS — N189 Chronic kidney disease, unspecified: Secondary | ICD-10-CM

## 2015-12-28 DIAGNOSIS — G63 Polyneuropathy in diseases classified elsewhere: Secondary | ICD-10-CM | POA: Diagnosis not present

## 2015-12-28 DIAGNOSIS — E11628 Type 2 diabetes mellitus with other skin complications: Secondary | ICD-10-CM

## 2015-12-28 NOTE — Assessment & Plan Note (Signed)
He is doing much better and has no current evidence of active infection. I will continue observation off of antibiotics. He will follow-up here in 4-6 weeks.

## 2015-12-28 NOTE — Progress Notes (Signed)
[facility] for Infectious Disease  Patient Active Problem List   Diagnosis Date Noted  . Diabetic foot infection ([place]) 10/11/2015    Priority: High  . Acute kidney injury superimposed on chronic kidney disease ([place]) 10/11/2015    Priority: High  . Peripheral neuropathy ([place]) 11/11/2015  . Asthma 07/23/2015  . Chronic headache 07/23/2015  . Erectile disorder due to medical condition in male 04/23/2015  . BMI 33.0-33.9,adult 04/21/2012  . GERD 04/21/2008  . Type 2 diabetes mellitus with diabetic neuropathy, with long-term current use of insulin ([place]) 11/21/2006  . Essential hypertension 11/21/2006  . LUMBAR STRAIN 11/21/2006    Patient's Medications  New Prescriptions   No medications on file  Previous Medications   AMLODIPINE (NORVASC) 10 MG TABLET    Take 1 tablet (10 mg total) by mouth daily.   CLONIDINE (CATAPRES) 0.1 MG TABLET    Take 1 tablet (0.1 mg total) by mouth 2 (two) times daily.   COLLAGENASE (SANTYL) OINTMENT    Apply 1 application topically daily. Wash left foot with soap and water daily dry the foot and then apply Santyl to a dry gauze dressing apply Ace wrap continue nonweightbearing on the left foot change dressing daily.   FEEDING SUPPLEMENT, GLUCERNA SHAKE, (GLUCERNA SHAKE) LIQD    Take 237 mLs by mouth 2 (two) times daily between meals.   GLUCOSE BLOOD (ONETOUCH VERIO) TEST STRIP    Use to check blood sugar 1 time per day.   HYDRALAZINE (APRESOLINE) 25 MG TABLET    Take 1 tablet (25 mg total) by mouth every 8 (eight) hours.   INSULIN ASPART (NOVOLOG FLEXPEN) 100 UNIT/ML FLEXPEN    Before each meal 3 times a day, 140-199 - 2 units, 200-250 - 4 units, 251-299 - 6 units,  300-349 - 8 units,  350 or above 10 units. Insulin PEN if approved, provide syringes and needles if needed.   INSULIN GLARGINE (LANTUS) 100 UNIT/ML SOLOSTAR PEN    Inject 20 Units into the skin daily at 10 pm.   INSULIN PEN NEEDLE (PEN NEEDLES) 31G X 6 MM MISC    For a month supply    LABETALOL (NORMODYNE) 200 MG TABLET    Take 1 tablet (200 mg total) by mouth 2 (two) times daily.   OXYCODONE HCL 10 MG TABS    Take 1 tablet (10 mg total) by mouth every 6 (six) hours as needed.  Modified Medications   No medications on file  Discontinued Medications   No medications on file    Subjective: Danny Fuller is in for his routine follow-up visit. He completed 8 weeks of total antibiotic therapy for his left foot infection on 12/05/2015. He states that he is gradually feeling better. He feels like his wound is looking much better. He saw Dr. Sharol Given recently who instructed him to stop using Santyl. He has been keeping a dry gauze dressing on so that his compressive stocking would not irritate it. He saw Dr. Lorrene Reid recently and was told that his creatinine had come back down to 1. He is having a great deal of difficulty with his neuropathic pain. He had stopped taking Lyrica when he was hospitalized recently because he had developed tremors and he was concerned that that was a side effect. He wants to know if he should restart it.  Review of Systems: Review of Systems  Constitutional: Negative for chills, diaphoresis, fever, malaise/fatigue and weight loss.  HENT: Negative for sore throat.  Respiratory: Negative for cough, sputum production and shortness of breath.   Cardiovascular: Negative for chest pain.  Gastrointestinal: Negative for abdominal pain, diarrhea, nausea and vomiting.  Genitourinary: Negative for dysuria and frequency.  Musculoskeletal: Negative for joint pain and myalgias.       As noted in history of present illness.  Skin: Negative for rash.  Neurological: Negative for dizziness and headaches.    Past Medical History:  Diagnosis Date  . Asthma   . Diabetes mellitus   . GERD (gastroesophageal reflux disease)   . Headache(784.0)   . Hypertension     Social History  Substance Use Topics  . Smoking status: Former Smoker    Types: Cigarettes  . Smokeless  tobacco: Never Used     Comment: quit 5 days ago  . Alcohol use No    Family History  Problem Relation Age of Onset  . Arthritis    . Diabetes    . Hypertension    . Hyperlipidemia    . Stroke    . Sudden death    . Diabetes Mother   . Diabetes Sister   . Diabetes Brother     Allergies  Allergen Reactions  . Lyrica [Pregabalin] Other (See Comments)    tremors    Objective: Vitals:   12/28/15 1115 12/28/15 1123  BP: (!) 175/115 (!) 171/106  Pulse: 84 93  Temp: 97.9 F (36.6 C)   TempSrc: Oral   Weight: 230 lb 12 oz (104.7 kg)    Body mass index is 33.11 kg/m.  Physical Exam  Constitutional: He is oriented to person, place, and time.  He is in good spirits.  Eyes: Conjunctivae are normal.  Cardiovascular: Normal rate and regular rhythm.   No murmur heard. Pulmonary/Chest: Effort normal and breath sounds normal.  Abdominal: Soft. He exhibits no mass. There is no tenderness.  Musculoskeletal:  He has good granulation tissue on 95% of his wound over the lateral side of his left foot. There is a scant amount of yellow drainage on his gauze dressing. There is no exposed bone. He has only minimal swelling in his foot.  Neurological: He is alert and oriented to person, place, and time.  He walks with the aid of a cane.  Skin: No rash noted.  Psychiatric: Mood and affect normal.    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 I will continue observation off of antibiotics. He will follow-up here in 4-6 weeks.        Unprioritized   Peripheral neuropathy ([place])    The tremors that he had when he was hospitalized in September may have been related to supratherapeutic levels of Lyrica caused by  his renal insufficiency. Now that his renal function has returned to normal I suspect that he might tolerate restarting Lyrica. I suggested that he take 75 mg daily for several days to see if he tolerates it then increased back to twice daily like he had been taking previously.          Michel Bickers, MD [facility] [phone] pager   [phone] cell 12/28/2015, 11:49 AM

## 2015-12-28 NOTE — Assessment & Plan Note (Signed)
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

## 2015-12-28 NOTE — Assessment & Plan Note (Signed)
The tremors that he had when he was hospitalized in September may have been related to supratherapeutic levels of Lyrica caused by his renal insufficiency. Now that his renal function has returned to normal I suspect that he might tolerate restarting Lyrica. I suggested that he take 75 mg daily for several days to see if he tolerates it then increased back to twice daily like he had been taking previously.

## 2015-12-29 ENCOUNTER — Encounter (HOSPITAL_COMMUNITY): Payer: Self-pay | Admitting: *Deleted

## 2015-12-29 ENCOUNTER — Emergency Department (HOSPITAL_COMMUNITY): Payer: BC Managed Care – PPO

## 2015-12-29 DIAGNOSIS — Z794 Long term (current) use of insulin: Secondary | ICD-10-CM | POA: Diagnosis not present

## 2015-12-29 DIAGNOSIS — Z87891 Personal history of nicotine dependence: Secondary | ICD-10-CM | POA: Diagnosis not present

## 2015-12-29 DIAGNOSIS — R0789 Other chest pain: Secondary | ICD-10-CM | POA: Insufficient documentation

## 2015-12-29 DIAGNOSIS — J45909 Unspecified asthma, uncomplicated: Secondary | ICD-10-CM | POA: Diagnosis not present

## 2015-12-29 DIAGNOSIS — I10 Essential (primary) hypertension: Secondary | ICD-10-CM | POA: Insufficient documentation

## 2015-12-29 DIAGNOSIS — R079 Chest pain, unspecified: Secondary | ICD-10-CM | POA: Diagnosis present

## 2015-12-29 DIAGNOSIS — E1140 Type 2 diabetes mellitus with diabetic neuropathy, unspecified: Secondary | ICD-10-CM | POA: Diagnosis not present

## 2015-12-29 LAB — CBC WITH DIFFERENTIAL/PLATELET
BASOS ABS: 0 10*3/uL (ref 0.0–0.1)
BASOS PCT: 0 %
EOS ABS: 0.1 10*3/uL (ref 0.0–0.7)
EOS PCT: 2 %
HEMATOCRIT: 36.8 % — AB (ref 39.0–52.0)
Hemoglobin: 12.9 g/dL — ABNORMAL LOW (ref 13.0–17.0)
Lymphocytes Relative: 45 %
Lymphs Abs: 3.8 10*3/uL (ref 0.7–4.0)
MCH: 28.9 pg (ref 26.0–34.0)
MCHC: 35.1 g/dL (ref 30.0–36.0)
MCV: 82.5 fL (ref 78.0–100.0)
MONO ABS: 0.7 10*3/uL (ref 0.1–1.0)
MONOS PCT: 9 %
NEUTROS ABS: 3.7 10*3/uL (ref 1.7–7.7)
Neutrophils Relative %: 44 %
PLATELETS: 262 10*3/uL (ref 150–400)
RBC: 4.46 MIL/uL (ref 4.22–5.81)
RDW: 12.6 % (ref 11.5–15.5)
WBC: 8.4 10*3/uL (ref 4.0–10.5)

## 2015-12-29 LAB — I-STAT TROPONIN, ED: Troponin i, poc: 0 ng/mL (ref 0.00–0.08)

## 2015-12-29 NOTE — ED Triage Notes (Signed)
Patient presents with c/o CP and HTN.  Stated his CP started earlier today with slight SOB

## 2015-12-30 ENCOUNTER — Emergency Department (HOSPITAL_COMMUNITY)
Admission: EM | Admit: 2015-12-30 | Discharge: 2015-12-30 | Disposition: A | Discharge status: Home / Self Care | Payer: BC Managed Care – PPO | Attending: Emergency Medicine | Admitting: Emergency Medicine

## 2015-12-30 DIAGNOSIS — R0789 Other chest pain: Secondary | ICD-10-CM

## 2015-12-30 LAB — COMPREHENSIVE METABOLIC PANEL
ALBUMIN: 4 g/dL (ref 3.5–5.0)
ALT: 23 U/L (ref 17–63)
ANION GAP: 7 (ref 5–15)
AST: 18 U/L (ref 15–41)
Alkaline Phosphatase: 80 U/L (ref 38–126)
BUN: 20 mg/dL (ref 6–20)
CHLORIDE: 101 mmol/L (ref 101–111)
CO2: 24 mmol/L (ref 22–32)
Calcium: 9.4 mg/dL (ref 8.9–10.3)
Creatinine, Ser: 1.21 mg/dL (ref 0.61–1.24)
GFR calc Af Amer: >60 mL/min (ref >60)
GFR calc non Af Amer: >60 mL/min (ref >60)
GLUCOSE: 303 mg/dL — AB (ref 65–99)
Potassium: 3.8 mmol/L (ref 3.5–5.1)
SODIUM: 132 mmol/L — AB (ref 135–145)
TOTAL PROTEIN: 7.6 g/dL (ref 6.5–8.1)
Total Bilirubin: 0.5 mg/dL (ref 0.3–1.2)

## 2015-12-30 LAB — I-STAT TROPONIN, ED: Troponin i, poc: 0 ng/mL (ref 0.00–0.08)

## 2015-12-30 MED ORDER — ONDANSETRON 4 MG PO TBDP
4.0000 mg | ORAL_TABLET | Freq: Once | ORAL | Status: AC
  Administered 2015-12-30: 4 mg via ORAL
  Filled 2015-12-30: qty 1

## 2015-12-30 MED ORDER — HYDROCODONE-ACETAMINOPHEN 5-325 MG PO TABS: 1.0000 | ORAL_TABLET | ORAL | 0 refills | Status: DC | PRN

## 2015-12-30 MED ORDER — IPRATROPIUM BROMIDE 0.02 % IN SOLN
0.5000 mg | Freq: Once | RESPIRATORY_TRACT | Status: DC
  Filled 2015-12-30: qty 2.5

## 2015-12-30 MED ORDER — ALBUTEROL SULFATE (2.5 MG/3ML) 0.083% IN NEBU
5.0000 mg | INHALATION_SOLUTION | Freq: Once | RESPIRATORY_TRACT | Status: DC
  Filled 2015-12-30: qty 6

## 2015-12-30 MED ORDER — MORPHINE SULFATE (PF) 4 MG/ML IV SOLN
4.0000 mg | Freq: Once | INTRAVENOUS | Status: AC
  Administered 2015-12-30: 4 mg via INTRAMUSCULAR
  Filled 2015-12-30: qty 1

## 2015-12-30 NOTE — ED Provider Notes (Signed)
[facility] DEPT Provider Note   CSN: [id number] Arrival date & time: 12/29/15  2309  History   Chief Complaint Chief Complaint  Patient presents with  . Chest Pain  . Hypertension    HPI Danny Fuller is a 40 y.o. male.  HPI   Patient has PMH of diabetes, GERD, HA, and hypertension and peripheral neuropathy, chronic headache, asthma comes to the ER with complaints of CP and hypertension. The CP started earlier today and is associated with some mild SOB. *no LE swelling, the patient does not appear to be in any distress.  Past Medical History:  Diagnosis Date  . Asthma   . Diabetes mellitus   . GERD (gastroesophageal reflux disease)   . Headache(784.0)   . Hypertension     Patient Active Problem List   Diagnosis Date Noted  . Foot amputation status, left ([place]) 01/04/2016  . Peripheral neuropathy ([place]) 11/11/2015  . Diabetic foot infection ([place]) 10/11/2015  . Acute kidney injury superimposed on chronic kidney disease ([place]) 10/11/2015  . Asthma 07/23/2015  . Chronic headache 07/23/2015  . Erectile disorder due to medical condition in male 04/23/2015  . BMI 33.0-33.9,adult 04/21/2012  . GERD 04/21/2008  . Type 2 diabetes mellitus with diabetic neuropathy, with long-term current use of insulin ([place]) 11/21/2006  . Essential hypertension 11/21/2006  . LUMBAR STRAIN 11/21/2006    Past Surgical History:  Procedure Laterality Date  . AMPUTATION Left 07/26/2015   Procedure: AMPUTATION LEFT FIFTH RAY;  Surgeon: [name], MD;  Location: [facility];  Service: Orthopedics;  Laterality: Left;  . bilateral hip pins placed      Home Medications    Prior to Admission medications   Medication Sig Start Date End Date Taking? Authorizing Provider  cloNIDine (CATAPRES) 0.1 MG tablet Take 1 tablet (0.1 mg total) by mouth 2 (two) times daily. 12/22/15  Yes [name], MD  feeding supplement, GLUCERNA SHAKE, (GLUCERNA SHAKE) LIQD Take 237 mLs by mouth 2 (two) times daily  between meals. 11/10/15  Yes Orson Eva, MD  hydrALAZINE (APRESOLINE) 25 MG tablet Take 1 tablet (25 mg total) by mouth every 8 (eight) hours. 12/22/15  Yes [name], MD  insulin aspart (NOVOLOG FLEXPEN) 100 UNIT/ML FlexPen Before each meal 3 times a day, 140-199 - 2 units, 200-250 - 4 units, 251-299 - 6 units,  300-349 - 8 units,  350 or above 10 units. Insulin PEN if approved, provide syringes and needles if needed. 07/29/15  Yes [name], MD  Insulin Glargine (LANTUS) 100 UNIT/ML Solostar Pen Inject 20 Units into the skin daily at 10 pm. 07/29/15  Yes [name], MD  labetalol (NORMODYNE) 200 MG tablet Take 1 tablet (200 mg total) by mouth 2 (two) times daily. 11/10/15  Yes Orson Eva, MD  amLODipine (NORVASC) 10 MG tablet Take 1 tablet (10 mg total) by mouth daily. 11/10/15   Orson Eva, MD  collagenase (SANTYL) ointment Apply 1 application topically daily. Wash left foot with soap and water daily dry the foot and then apply Santyl to a dry gauze dressing apply Ace wrap continue nonweightbearing on the left foot change dressing daily. 10/11/15   [name], MD  glucose blood (ONETOUCH VERIO) test strip Use to check blood sugar 1 time per day. 09/16/15   [name], MD  HYDROcodone-acetaminophen (NORCO/VICODIN) 5-325 MG tablet Take 1-2 tablets by mouth every 4 (four) hours as needed. 12/30/15   Delos Haring, PA-C  Insulin Pen Needle (PEN NEEDLES) 31G  X 6 MM MISC For a month supply 07/29/15   [name], MD  Oxycodone HCl 10 MG TABS Take 1 tablet (10 mg total) by mouth every 6 (six) hours as needed. 11/10/15   Orson Eva, MD    Family History Family History  Problem Relation Age of Onset  . Arthritis    . Diabetes    . Hypertension    . Hyperlipidemia    . Stroke    . Sudden death    . Diabetes Mother   . Diabetes Sister   . Diabetes Brother     Social History Social History  Substance Use Topics  . Smoking status: Former Smoker    Types: Cigarettes  . Smokeless tobacco: Never Used      Comment: quit 5 days ago  . Alcohol use No     Allergies   Lyrica [pregabalin]   Review of Systems Review of Systems Review of Systems All other systems negative except as documented in the HPI. All pertinent positives and negatives as reviewed in the HPI.   Physical Exam Updated Vital Signs BP 159/98   Pulse 84   Temp 98.2 F (36.8 C) (Oral)   Resp 13   Ht 5\' 10"  (1.778 m)   Wt 104.3 kg   SpO2 99%   BMI 33.00 kg/m   Physical Exam  Constitutional: He appears well-developed and well-nourished. No distress.  HENT:  Head: Normocephalic and atraumatic.  Nose: Nose normal.  Mouth/Throat: Uvula is midline, oropharynx is clear and moist and mucous membranes are normal.  Eyes: Pupils are equal, round, and reactive to light.  Neck: Normal range of motion. Neck supple.  Cardiovascular: Normal rate and regular rhythm.   Pulmonary/Chest: Effort normal.  Abdominal: Soft.  No signs of abdominal distention  Musculoskeletal:  No LE swelling  Neurological: He is alert.  Acting at baseline  Skin: Skin is warm and dry. No rash noted.  Nursing note and vitals reviewed.   ED Treatments / Results  Labs (all labs ordered are listed, but only abnormal results are displayed) Labs Reviewed  CBC WITH DIFFERENTIAL/PLATELET - Abnormal; Notable for the following:       Result Value   Hemoglobin 12.9 (*)    HCT 36.8 (*)    All other components within normal limits  COMPREHENSIVE METABOLIC PANEL - Abnormal; Notable for the following:    Sodium 132 (*)    Glucose, Bld 303 (*)    All other components within normal limits  I-STAT TROPOININ, ED  I-STAT TROPOININ, ED    EKG  EKG Interpretation  Date/Time:  Wednesday December 29 2015 23:14:11 EST Ventricular Rate:  94 PR Interval:  166 QRS Duration: 82 QT Interval:  322 QTC Calculation: 402 R Axis:   76 Text Interpretation:  Normal sinus rhythm Normal ECG Normal ECG Confirmed by Carmin Muskrat  MD ([id number]) on 12/31/2015 10:53:12  PM       Radiology Dg Chest 2 View  Result Date: 12/30/2015 CLINICAL DATA:  Initial evaluation for acute chest pain, elevated blood pressure. History of diabetes and asthma. EXAM: CHEST  2 VIEW COMPARISON:  Prior radiograph from 11/02/2015. FINDINGS: The cardiac and mediastinal silhouettes are stable in size and contour, and remain within normal limits. The lungs are mildly hypoinflated. No airspace consolidation, pleural effusion, or pulmonary edema is identified. There is no pneumothorax. No acute osseous abnormality identified. IMPRESSION: No active cardiopulmonary disease. Electronically Signed   By: Jeannine Boga M.D.   On: 12/30/2015 00:09  Procedures Procedures (including critical care time)  Medications Ordered in ED Medications  morphine 4 MG/ML injection 4 mg (4 mg Intramuscular Given 12/30/15 0551)  ondansetron (ZOFRAN-ODT) disintegrating tablet 4 mg (4 mg Oral Given 12/30/15 0551)     Initial Impression / Assessment and Plan / ED Course  I have reviewed the triage vital signs and the nursing notes.  Pertinent labs & imaging results that were available during my care of the patient were reviewed by me and considered in my medical decision making (see chart for details).  Clinical Course    Heart Score of 2 due to 3 risk factors. Two negative Troponins, normal chest, EKG, CBC and BMP are better than normal. Patients pain improved to 5/10 from 9/10 with pain medication. It is reproducible to palpation and movement of his left shoulder. Will refer to Cardiology for stress test. Discussed return precautions.  Final Clinical Impressions(s) / ED Diagnoses   Final diagnoses:  Atypical chest pain   New Prescriptions Discharge Medication List as of 12/30/2015  6:17 AM    START taking these medications   Details  HYDROcodone-acetaminophen (NORCO/VICODIN) 5-325 MG tablet Take 1-2 tablets by mouth every 4 (four) hours as needed., Starting Thu 12/30/2015, Print           Delos Haring, PA-C 01/06/16 [place], DO 01/09/16 0003

## 2016-01-04 ENCOUNTER — Ambulatory Visit (INDEPENDENT_AMBULATORY_CARE_PROVIDER_SITE_OTHER): Payer: BC Managed Care – PPO | Admitting: Family

## 2016-01-04 ENCOUNTER — Encounter (INDEPENDENT_AMBULATORY_CARE_PROVIDER_SITE_OTHER): Payer: Self-pay | Admitting: Orthopedic Surgery

## 2016-01-04 VITALS — Ht 70.0 in | Wt 230.0 lb

## 2016-01-04 DIAGNOSIS — Z89432 Acquired absence of left foot: Secondary | ICD-10-CM | POA: Diagnosis not present

## 2016-01-04 DIAGNOSIS — E1140 Type 2 diabetes mellitus with diabetic neuropathy, unspecified: Secondary | ICD-10-CM | POA: Diagnosis not present

## 2016-01-04 DIAGNOSIS — Z794 Long term (current) use of insulin: Secondary | ICD-10-CM

## 2016-01-04 DIAGNOSIS — IMO0002 Reserved for concepts with insufficient information to code with codable children: Secondary | ICD-10-CM

## 2016-01-04 NOTE — Progress Notes (Signed)
Office Visit Note   Patient: Danny Fuller           Date of Birth: Jun 30, 1975           MRN: [id number] Visit Date: 01/04/2016              Requested by: [name], MD [place],  [id number] PCP: [name], MD   Assessment & Plan: Visit Diagnoses:  1. Type 2 diabetes mellitus with diabetic neuropathy, with long-term current use of insulin ([place])   2. Foot amputation status, left ([place])     Plan:  Will continue out of work. We discussed the fact that he would like to apply for disability. My hope is that as soon as this wound is healed he will be able to return to work. Recommended Silvadene or antibacterial ointment dressings. Patient declined. He will Continue dry dressings. May advance weightbearing as tolerated in postop shoe. Follow-up in 2 more weeks.   Follow-Up Instructions: Return in about 2 weeks (around 01/18/2016).   Orders:  No orders of the defined types were placed in this encounter.  No orders of the defined types were placed in this encounter.     Procedures: No procedures performed   Clinical Data: No additional findings.   Subjective: Chief Complaint  Patient presents with  . Left Foot - Follow-up    Left foot s/p 5th ray amputation on 07/26/15     Pt is s/p a left 5th ray amputation 5 months ago. He is wearing the vive compression sock but has placed a dry dressing on the open area because he was noticed to apply the sock directly over the wound. His ulcer has been slow to heal. There is marked improvement today. The pt has a small amount of bloody drainage.     Review of Systems  Constitutional: Negative for chills and fever.     Objective: Vital Signs: Ht 5\' 10"  (1.778 m)   Wt 230 lb (104.3 kg)   BMI 33.00 kg/m   Physical Exam  Constitutional: He is oriented to person, place, and time. He appears well-developed and well-nourished.  Pulmonary/Chest: Effort normal.  Neurological: He is alert and oriented  to person, place, and time.  Psychiatric: He has a normal mood and affect.  Nursing note reviewed.  Dorsolateral foot with a small 2 cm in diameter ulceration. This is completely filled in with beefy granulation tissue. Scant bloody drainage. No purulence odor surrounding erythema or sign of infection. Ortho Exam  Specialty Comments:  No specialty comments available.  Imaging: No results found.   PMFS History: Patient Active Problem List   Diagnosis Date Noted  . Foot amputation status, left ([place]) 01/04/2016  . Peripheral neuropathy ([place]) 11/11/2015  . Diabetic foot infection ([place]) 10/11/2015  . Acute kidney injury superimposed on chronic kidney disease ([place]) 10/11/2015  . Asthma 07/23/2015  . Chronic headache 07/23/2015  . Erectile disorder due to medical condition in male 04/23/2015  . BMI 33.0-33.9,adult 04/21/2012  . GERD 04/21/2008  . Type 2 diabetes mellitus with diabetic neuropathy, with long-term current use of insulin ([place]) 11/21/2006  . Essential hypertension 11/21/2006  . LUMBAR STRAIN 11/21/2006   Past Medical History:  Diagnosis Date  . Asthma   . Diabetes mellitus   . GERD (gastroesophageal reflux disease)   . Headache(784.0)   . Hypertension     Family History  Problem Relation Age of Onset  . Arthritis    . Diabetes    .  Hypertension    . Hyperlipidemia    . Stroke    . Sudden death    . Diabetes Mother   . Diabetes Sister   . Diabetes Brother     Past Surgical History:  Procedure Laterality Date  . AMPUTATION Left 07/26/2015   Procedure: AMPUTATION LEFT FIFTH RAY;  Surgeon: [name], MD;  Location: [facility];  Service: Orthopedics;  Laterality: Left;  . bilateral hip pins placed     Social History   Occupational History  . Not on file.   Social History Main Topics  . Smoking status: Former Smoker    Types: Cigarettes  . Smokeless tobacco: Never Used     Comment: quit 5 days ago  . Alcohol use No  . Drug use: No  . Sexual activity:  Not on file

## 2016-01-10 ENCOUNTER — Encounter: Payer: Self-pay | Admitting: Family Medicine

## 2016-01-17 NOTE — Telephone Encounter (Signed)
He should make an OV to discuss his meds

## 2016-01-18 ENCOUNTER — Ambulatory Visit (INDEPENDENT_AMBULATORY_CARE_PROVIDER_SITE_OTHER): Payer: BC Managed Care – PPO | Admitting: Orthopedic Surgery

## 2016-01-18 DIAGNOSIS — E1142 Type 2 diabetes mellitus with diabetic polyneuropathy: Secondary | ICD-10-CM

## 2016-01-18 DIAGNOSIS — Z89422 Acquired absence of other left toe(s): Secondary | ICD-10-CM | POA: Insufficient documentation

## 2016-01-18 NOTE — Progress Notes (Signed)
Office Visit Note   Patient: Danny Fuller           Date of Birth: Jul 10, 1975           MRN: [id number] Visit Date: 01/18/2016              Requested by: [name], MD [place], [place] [address] PCP: [name], MD   Assessment & Plan: Visit Diagnoses:  1. Acquired absence of other left toe(s) ([place])   2. Diabetic polyneuropathy associated with type 2 diabetes mellitus ([place])     Plan: Continue heel cord stretching continue protected weightbearing. Patient does complain of severe neuropathy pain in both lower extremities. We will have him try the Cymbalta again. He does have a prescription at home. Patient has had seizures with the gabapentin and pre-gabapentin. Discussed with patient he will not be able to work on his feet due to the ray amputation and severe neuropathy where is an increased risk of further amputation. Patient has significant heel cord contracture was given instructions and demonstrated heel cord stretching to do 10 times a day.  Follow-Up Instructions: Return in about 4 weeks (around 02/15/2016).   Orders:  No orders of the defined types were placed in this encounter.  No orders of the defined types were placed in this encounter.     Procedures: No procedures performed   Clinical Data: No additional findings.   Subjective: Chief Complaint  Patient presents with  . Left Foot - Wound Check    Left foot s/p 5th ray amputation on 07/26/15     Left foot s/p 5th ray amputation on 07/26/15. He is weight bearing with a cane and a Vive sock with post op shoe. The wound is pink and and  Patient is treating with santyl ointment periodically.    Wound Check     Review of Systems   Objective: Vital Signs: There were no vitals taken for this visit.  Physical Exam examination patient is alert oriented no adenopathy well-dressed normal affect normal respiratory effort he does have an antalgic gait uses a cane. Examination he is  heel cord contracture worse on the left than the right with dorsiflexion short of neutral. Patient was given instructions heel cord stretching. His lateral left foot wound is showing excellent healing this is about 15 mm in diameter 0.1 mm deep with healthy beefy granulation tissue there is no cellulitis no signs of infection no exposed bone or tendon. Patient is healing his fifth ray amputation well.  Ortho Exam  Specialty Comments:  No specialty comments available.  Imaging: No results found.   PMFS History: Patient Active Problem List   Diagnosis Date Noted  . Diabetic polyneuropathy associated with type 2 diabetes mellitus ([place]) 01/18/2016  . Acquired absence of other left toe(s) ([place]) 01/18/2016  . Foot amputation status, left ([place]) 01/04/2016  . Peripheral neuropathy ([place]) 11/11/2015  . Diabetic foot infection ([place]) 10/11/2015  . Acute kidney injury superimposed on chronic kidney disease ([place]) 10/11/2015  . Asthma 07/23/2015  . Chronic headache 07/23/2015  . Erectile disorder due to medical condition in male 04/23/2015  . BMI 33.0-33.9,adult 04/21/2012  . GERD 04/21/2008  . Type 2 diabetes mellitus with diabetic neuropathy, with long-term current use of insulin ([place]) 11/21/2006  . Essential hypertension 11/21/2006  . LUMBAR STRAIN 11/21/2006   Past Medical History:  Diagnosis Date  . Asthma   . Diabetes mellitus   . GERD (gastroesophageal reflux disease)   .  Headache(784.0)   . Hypertension     Family History  Problem Relation Age of Onset  . Arthritis    . Diabetes    . Hypertension    . Hyperlipidemia    . Stroke    . Sudden death    . Diabetes Mother   . Diabetes Sister   . Diabetes Brother     Past Surgical History:  Procedure Laterality Date  . AMPUTATION Left 07/26/2015   Procedure: AMPUTATION LEFT FIFTH RAY;  Surgeon: [name], MD;  Location: [facility];  Service: Orthopedics;  Laterality: Left;  . bilateral hip pins placed     Social History    Occupational History  . Not on file.   Social History Main Topics  . Smoking status: Former Smoker    Types: Cigarettes  . Smokeless tobacco: Never Used     Comment: quit 5 days ago  . Alcohol use No  . Drug use: No  . Sexual activity: Not on file

## 2016-01-19 ENCOUNTER — Ambulatory Visit (INDEPENDENT_AMBULATORY_CARE_PROVIDER_SITE_OTHER): Payer: BC Managed Care – PPO | Admitting: Family Medicine

## 2016-01-19 ENCOUNTER — Encounter: Payer: Self-pay | Admitting: Family Medicine

## 2016-01-19 VITALS — BP 188/108 | HR 99 | Temp 98.0°F | Ht 70.0 in | Wt 238.0 lb

## 2016-01-19 DIAGNOSIS — M2550 Pain in unspecified joint: Secondary | ICD-10-CM | POA: Insufficient documentation

## 2016-01-19 DIAGNOSIS — I10 Essential (primary) hypertension: Secondary | ICD-10-CM | POA: Diagnosis not present

## 2016-01-19 MED ORDER — LISINOPRIL 20 MG PO TABS: 20.0000 mg | ORAL_TABLET | Freq: Every day | ORAL | 3 refills | Status: DC

## 2016-01-19 MED ORDER — TRAMADOL HCL 50 MG PO TABS: 100.0000 mg | ORAL_TABLET | Freq: Four times a day (QID) | ORAL | 2 refills | Status: DC | PRN

## 2016-01-19 NOTE — Progress Notes (Signed)
   Subjective:    Patient ID: Danny Fuller, male    DOB: 03-21-1975, 40 y.o.   MRN: [id number]  HPI Here to follow up on HTN and to ask about joint pains. His BP has been high for a few months, and when he saw [name] a month ago we started him on Clonidine 0.1 mg bid. This has not helped at all, and his BP is often 123456 systolic. He saw Dr. Lorrene Reid a few weeks ago and she said his kidney function is totally back to normal. In fact she told him he could get back on Lisinopril if he wanted to. In fact Danny Fuller says that Lisinopril worked better for him than any other antihypertensive he has tried. Also he mentions pains in the shoulders and knees that started about 6 months ago. These have gotten worse in the past month. He feels stiff in the mornings and then loosens up a bit during the day.    Review of Systems  Constitutional: Negative.   Respiratory: Negative.   Cardiovascular: Negative.   Musculoskeletal: Positive for arthralgias. Negative for joint swelling.  Neurological: Negative.        Objective:   Physical Exam  Constitutional: He is oriented to person, place, and time. He appears well-developed and well-nourished.  Cardiovascular: Normal rate, regular rhythm, normal heart sounds and intact distal pulses.   Pulmonary/Chest: Effort normal and breath sounds normal.  Musculoskeletal: Normal range of motion. He exhibits no edema or tenderness.  Neurological: He is alert and oriented to person, place, and time.          Assessment & Plan:  For the HTN we will get back on Lisinopril 20 mg daily and stop the Clonidine. He seems to have developed some arthritis so he will try Tramadol for this. Recheck in 3 weeks.  [name], MD

## 2016-01-19 NOTE — Progress Notes (Signed)
Pre visit review using our clinic review tool, if applicable. No additional management support is needed unless otherwise documented below in the visit note. 

## 2016-01-27 ENCOUNTER — Encounter (INDEPENDENT_AMBULATORY_CARE_PROVIDER_SITE_OTHER): Payer: Self-pay | Admitting: Family

## 2016-01-27 ENCOUNTER — Encounter (INDEPENDENT_AMBULATORY_CARE_PROVIDER_SITE_OTHER): Payer: Self-pay | Admitting: Orthopedic Surgery

## 2016-01-31 ENCOUNTER — Ambulatory Visit (INDEPENDENT_AMBULATORY_CARE_PROVIDER_SITE_OTHER): Payer: BC Managed Care – PPO | Admitting: Internal Medicine

## 2016-01-31 ENCOUNTER — Encounter: Payer: Self-pay | Admitting: Internal Medicine

## 2016-01-31 DIAGNOSIS — E11628 Type 2 diabetes mellitus with other skin complications: Secondary | ICD-10-CM

## 2016-01-31 DIAGNOSIS — L089 Local infection of the skin and subcutaneous tissue, unspecified: Secondary | ICD-10-CM | POA: Diagnosis not present

## 2016-01-31 DIAGNOSIS — G63 Polyneuropathy in diseases classified elsewhere: Secondary | ICD-10-CM | POA: Diagnosis not present

## 2016-01-31 DIAGNOSIS — E1142 Type 2 diabetes mellitus with diabetic polyneuropathy: Secondary | ICD-10-CM

## 2016-01-31 DIAGNOSIS — E1169 Type 2 diabetes mellitus with other specified complication: Secondary | ICD-10-CM | POA: Diagnosis not present

## 2016-01-31 MED ORDER — PREGABALIN 75 MG PO CAPS: 75.0000 mg | ORAL_CAPSULE | Freq: Two times a day (BID) | ORAL | 5 refills | Status: DC

## 2016-01-31 NOTE — Assessment & Plan Note (Signed)
He is improving without any evidence of relapse of his diabetic foot infection. He will continue off of antibiotics and follow-up here as needed.

## 2016-01-31 NOTE — Assessment & Plan Note (Signed)
He had tremors when he was on Lyrica when hospitalized several months ago. He had renal insufficiency at that time and his renal function has now returned to normal. I suspect that he may tolerate and get improvement from low-dose Lyrica. I suggested that he try 75 mg once daily for several days. If he tolerates it he can increase to twice a day dosing. He recalls that he did get significant relief of his neuropathic pain when he was on the Lyrica.

## 2016-01-31 NOTE — Progress Notes (Signed)
[facility] for Infectious Disease  Patient Active Problem List   Diagnosis Date Noted  . Diabetic foot infection ([place]) 10/11/2015    Priority: High  . Acute kidney injury superimposed on chronic kidney disease ([place]) 10/11/2015    Priority: High  . Arthralgia of multiple joints 01/19/2016  . Diabetic polyneuropathy associated with type 2 diabetes mellitus ([place]) 01/18/2016  . Acquired absence of other left toe(s) ([place]) 01/18/2016  . Foot amputation status, left ([place]) 01/04/2016  . Peripheral neuropathy ([place]) 11/11/2015  . Asthma 07/23/2015  . Chronic headache 07/23/2015  . Erectile disorder due to medical condition in male 04/23/2015  . BMI 33.0-33.9,adult 04/21/2012  . GERD 04/21/2008  . Type 2 diabetes mellitus with diabetic neuropathy, with long-term current use of insulin ([place]) 11/21/2006  . Essential hypertension 11/21/2006  . LUMBAR STRAIN 11/21/2006    Patient's Medications  New Prescriptions   PREGABALIN (LYRICA) 75 MG CAPSULE    Take 1 capsule (75 mg total) by mouth 2 (two) times daily.  Previous Medications   AMLODIPINE (NORVASC) 10 MG TABLET    Take 1 tablet (10 mg total) by mouth daily.   COLLAGENASE (SANTYL) OINTMENT    Apply 1 application topically daily. Wash left foot with soap and water daily dry the foot and then apply Santyl to a dry gauze dressing apply Ace wrap continue nonweightbearing on the left foot change dressing daily.   FEEDING SUPPLEMENT, GLUCERNA SHAKE, (GLUCERNA SHAKE) LIQD    Take 237 mLs by mouth 2 (two) times daily between meals.   GLUCOSE BLOOD (ONETOUCH VERIO) TEST STRIP    Use to check blood sugar 1 time per day.   HYDRALAZINE (APRESOLINE) 25 MG TABLET    Take 1 tablet (25 mg total) by mouth every 8 (eight) hours.   INSULIN ASPART (NOVOLOG FLEXPEN) 100 UNIT/ML FLEXPEN    Before each meal 3 times a day, 140-199 - 2 units, 200-250 - 4 units, 251-299 - 6 units,  300-349 - 8 units,  350 or above 10 units. Insulin PEN if approved,  provide syringes and needles if needed.   INSULIN GLARGINE (LANTUS) 100 UNIT/ML SOLOSTAR PEN    Inject 20 Units into the skin daily at 10 pm.   INSULIN PEN NEEDLE (PEN NEEDLES) 31G X 6 MM MISC    For a month supply   LABETALOL (NORMODYNE) 200 MG TABLET    Take 1 tablet (200 mg total) by mouth 2 (two) times daily.   LISINOPRIL (PRINIVIL,ZESTRIL) 20 MG TABLET    Take 1 tablet (20 mg total) by mouth daily.   TRAMADOL (ULTRAM) 50 MG TABLET    Take 2 tablets (100 mg total) by mouth every 6 (six) hours as needed for moderate pain.  Modified Medications   No medications on file  Discontinued Medications   No medications on file    Subjective: Danny Fuller is in for his routine follow-up visit. He completed 8 weeks of total antibiotic therapy for his left foot infection on 12/05/2015. He has not had any sign of recurrent infection since that time. He continues to be bothered by his neuropathic pain. He did not restart pregabalin after his last visit because he was worried that his tremors might come back.  Review of Systems: Review of Systems  Constitutional: Negative for chills, diaphoresis, fever, malaise/fatigue and weight loss.  HENT: Negative for sore throat.   Respiratory: Negative for cough, sputum production and shortness of breath.   Cardiovascular: Negative for  chest pain.  Gastrointestinal: Negative for abdominal pain, diarrhea, nausea and vomiting.  Genitourinary: Negative for dysuria and frequency.  Musculoskeletal: Negative for joint pain and myalgias.  Skin: Negative for rash.  Neurological: Negative for dizziness and headaches.    Past Medical History:  Diagnosis Date  . Asthma   . Diabetes mellitus   . GERD (gastroesophageal reflux disease)   . Headache(784.0)   . Hypertension     Social History  Substance Use Topics  . Smoking status: Former Smoker    Types: Cigarettes  . Smokeless tobacco: Never Used     Comment: quit 5 days ago  . Alcohol use No    Family History    Problem Relation Age of Onset  . Arthritis    . Diabetes    . Hypertension    . Hyperlipidemia    . Stroke    . Sudden death    . Diabetes Mother   . Diabetes Sister   . Diabetes Brother     Allergies  Allergen Reactions  . Lyrica [Pregabalin] Other (See Comments)    tremors    Objective: Vitals:   01/31/16 1540  BP: (!) 149/92  Pulse: 97  Temp: 98.2 F (36.8 C)  TempSrc: Oral  Weight: 238 lb 12 oz (108.3 kg)   Body mass index is 34.26 kg/m.  Physical Exam  Constitutional: He is oriented to person, place, and time.  He is in good spirits.  Eyes: Conjunctivae are normal.  Cardiovascular: Normal rate and regular rhythm.   No murmur heard. Pulmonary/Chest: Effort normal and breath sounds normal.  Abdominal: Soft. He exhibits no mass. There is no tenderness.  Musculoskeletal:  His fourth and fifth toes on his left foot are surgically absent. His wound looks good and is almost completely healed without any evidence of infection.  Neurological: He is alert and oriented to person, place, and time.  He walks with the aid of a cane.   Skin: No rash noted.  Psychiatric: Mood and affect normal.    Lab Results    Problem List Items Addressed This Visit      High   Diabetic foot infection ([place]) (Chronic)    He is improving without any evidence of relapse of his diabetic foot infection. He will continue off of antibiotics and follow-up here as needed.        Unprioritized   Diabetic polyneuropathy associated with type 2 diabetes mellitus ([place])   Relevant Medications   pregabalin (LYRICA) 75 MG capsule   Peripheral neuropathy ([place])    He had tremors when he was on Lyrica when hospitalized several months ago. He had renal insufficiency at that time and his renal function has now returned to normal. I suspect that he may tolerate and get improvement from low-dose Lyrica. I suggested that he try 75 mg once daily for several days. If he tolerates it he can increase to  twice a day dosing. He recalls that he did get significant relief of his neuropathic pain when he was on the Lyrica.      Relevant Medications   pregabalin (LYRICA) 75 MG capsule       Michel Bickers, MD [facility] for [place] [phone] pager   [phone] cell 01/31/2016, 4:02 PM

## 2016-01-31 NOTE — Assessment & Plan Note (Deleted)
He is improving without any evidence of relapse of his diabetic foot infection. He will continue off of antibiotics and follow-up here as needed.

## 2016-02-09 ENCOUNTER — Encounter (INDEPENDENT_AMBULATORY_CARE_PROVIDER_SITE_OTHER): Payer: Self-pay | Admitting: Family

## 2016-02-09 ENCOUNTER — Encounter (INDEPENDENT_AMBULATORY_CARE_PROVIDER_SITE_OTHER): Payer: Self-pay | Admitting: Orthopedic Surgery

## 2016-02-11 ENCOUNTER — Encounter (INDEPENDENT_AMBULATORY_CARE_PROVIDER_SITE_OTHER): Payer: Self-pay | Admitting: Family

## 2016-02-15 ENCOUNTER — Ambulatory Visit (INDEPENDENT_AMBULATORY_CARE_PROVIDER_SITE_OTHER): Payer: BC Managed Care – PPO | Admitting: Orthopedic Surgery

## 2016-02-15 ENCOUNTER — Ambulatory Visit (INDEPENDENT_AMBULATORY_CARE_PROVIDER_SITE_OTHER): Payer: BC Managed Care – PPO | Admitting: Family Medicine

## 2016-02-15 ENCOUNTER — Encounter: Payer: Self-pay | Admitting: Family Medicine

## 2016-02-15 ENCOUNTER — Encounter: Payer: Self-pay | Admitting: Endocrinology

## 2016-02-15 VITALS — BP 139/98 | HR 92 | Temp 98.5°F | Ht 70.0 in | Wt 238.0 lb

## 2016-02-15 DIAGNOSIS — IMO0002 Reserved for concepts with insufficient information to code with codable children: Secondary | ICD-10-CM

## 2016-02-15 DIAGNOSIS — E1142 Type 2 diabetes mellitus with diabetic polyneuropathy: Secondary | ICD-10-CM

## 2016-02-15 DIAGNOSIS — E1140 Type 2 diabetes mellitus with diabetic neuropathy, unspecified: Secondary | ICD-10-CM | POA: Diagnosis not present

## 2016-02-15 DIAGNOSIS — Z89432 Acquired absence of left foot: Secondary | ICD-10-CM

## 2016-02-15 DIAGNOSIS — M2550 Pain in unspecified joint: Secondary | ICD-10-CM | POA: Diagnosis not present

## 2016-02-15 DIAGNOSIS — Z794 Long term (current) use of insulin: Secondary | ICD-10-CM

## 2016-02-15 DIAGNOSIS — M6702 Short Achilles tendon (acquired), left ankle: Secondary | ICD-10-CM | POA: Diagnosis not present

## 2016-02-15 DIAGNOSIS — Z89422 Acquired absence of other left toe(s): Secondary | ICD-10-CM | POA: Diagnosis not present

## 2016-02-15 DIAGNOSIS — I10 Essential (primary) hypertension: Secondary | ICD-10-CM

## 2016-02-15 NOTE — Progress Notes (Signed)
   Subjective:    Patient ID: Danny Fuller, male    DOB: 07/10/1975, 41 y.o.   MRN: [id number]  HPI Here to follow up on multiple issues. First he has been taking Lisinopril again and his BP as responded nicely. He has been averaging in the 130s over 90s at home. His fasting glucoses have been running from 150 to 220. He has not seen Dr. Dwyane Dee since August. His last visit with him was on 09-16-15 just before he went into the hospital for his foot infection. He has struggled with multiple joint pains, especially in the shoulders and hips. He takes Lyric and Tramadol and this combination helps the pain, however he cannot take these during the daytime because they are so sedating. He recently saw Infectious Disease and they suggested he see a podiatrist to take care of his feet.    Review of Systems  Constitutional: Negative.   Respiratory: Negative.   Cardiovascular: Negative.   Gastrointestinal: Negative.   Musculoskeletal: Positive for arthralgias. Negative for joint swelling.  Neurological: Positive for numbness.       Objective:   Physical Exam  Constitutional: He is oriented to person, place, and time. He appears well-developed and well-nourished.  Neck: No thyromegaly present.  Cardiovascular: Normal rate, regular rhythm, normal heart sounds and intact distal pulses.   Pulmonary/Chest: Effort normal and breath sounds normal.  Musculoskeletal: He exhibits no edema.  Lymphadenopathy:    He has no cervical adenopathy.  Neurological: He is alert and oriented to person, place, and time.          Assessment & Plan:  His diabetes is not well controlled so I urged him to make a follow up appt with Dr. Dwyane Dee ASAP. He will do so. We will refer him to Podiatry to help take care of his feet. His joint pains seem to be from an inflammatory arthritis of some sort, so we will refer him to Rheumatology. His HTN is under reasonable control right now. He will see Dr. Sharol Given later today.  [name], MD

## 2016-02-15 NOTE — Progress Notes (Signed)
Office Visit Note   Patient: Danny Fuller           Date of Birth: [birth year]           MRN: [id number] Visit Date: 02/15/2016              Requested by: [name], MD [place], [place] [address] PCP: [name], MD   Assessment & Plan: Visit Diagnoses:  1. Diabetic polyneuropathy associated with type 2 diabetes mellitus ([place])   2. Acquired absence of other left toe(s) ([place])   3. Acquired contracture of Achilles tendon, left     Plan: Patient is again demonstrated heel cord stretching he will do this 5 times a day. Patient is given a prescription for biotech for extra-depth shoes custom orthotics and a spacer for his fifth ray amputation.  Follow-Up Instructions: Return in about 2 months (around 04/14/2016).   Orders:  No orders of the defined types were placed in this encounter.  No orders of the defined types were placed in this encounter.     Procedures: No procedures performed   Clinical Data: No additional findings.   Subjective: No chief complaint on file.   Patient is partially 6 months status post left foot fifth ray amputation with heel cord contracture is developing increasing pain and numbness over the forefoot. Patient states he does take Lyrica for his neuropathy as well as Ultram.    Review of Systems   Objective: Vital Signs: There were no vitals taken for this visit.   Physical Exam Examination patient is alert oriented no adenopathy well-dressed normal affect normal respiratory effort he does have an antalgic gait he has significant heel cord contracture worse on the left than the right. Dorsiflexion just short of neutral with his knee extended he has a good dorsalis pedis pulse the surgical incision is well-healed there is no redness no cellulitis no signs of infection. There is callus beneath the forefoot and callus beneath the fifth metatarsal head due to some over pulling of the posterior tibial tendon. His peroneal  tendon is intact. Ortho Exam  Specialty Comments:  No specialty comments available.  Imaging: No results found.   PMFS History: Patient Active Problem List   Diagnosis Date Noted  . Acquired contracture of Achilles tendon, left 02/15/2016  . Arthralgia of multiple joints 01/19/2016  . Diabetic polyneuropathy associated with type 2 diabetes mellitus ([place]) 01/18/2016  . Acquired absence of other left toe(s) ([place]) 01/18/2016  . Foot amputation status, left ([place]) 01/04/2016  . Peripheral neuropathy ([place]) 11/11/2015  . Diabetic foot infection ([place]) 10/11/2015  . Acute kidney injury superimposed on chronic kidney disease ([place]) 10/11/2015  . Asthma 07/23/2015  . Chronic headache 07/23/2015  . Erectile disorder due to medical condition in male 04/23/2015  . BMI 33.0-33.9,adult 04/21/2012  . GERD 04/21/2008  . Type 2 diabetes mellitus with diabetic neuropathy, with long-term current use of insulin ([place]) 11/21/2006  . Essential hypertension 11/21/2006  . LUMBAR STRAIN 11/21/2006   Past Medical History:  Diagnosis Date  . Asthma   . Diabetes mellitus    sees Dr. Dwyane Dee   . GERD (gastroesophageal reflux disease)   . Headache(784.0)   . Hypertension     Family History  Problem Relation Age of Onset  . Arthritis    . Diabetes    . Hypertension    . Hyperlipidemia    . Stroke    . Sudden death    . Diabetes Mother   .  Diabetes Sister   . Diabetes Brother     Past Surgical History:  Procedure Laterality Date  . AMPUTATION Left 07/26/2015   Procedure: AMPUTATION LEFT FIFTH RAY;  Surgeon: [name], MD;  Location: [facility];  Service: Orthopedics;  Laterality: Left;  . bilateral hip pins placed     Social History   Occupational History  . Not on file.   Social History Main Topics  . Smoking status: Former Smoker    Types: Cigarettes  . Smokeless tobacco: Never Used     Comment: quit 5 days ago  . Alcohol use No  . Drug use: No  . Sexual activity: Not on file

## 2016-02-15 NOTE — Progress Notes (Signed)
Pre visit review using our clinic review tool, if applicable. No additional management support is needed unless otherwise documented below in the visit note. 

## 2016-02-23 ENCOUNTER — Encounter: Payer: Self-pay | Admitting: Family Medicine

## 2016-02-23 ENCOUNTER — Encounter: Payer: Self-pay | Admitting: Endocrinology

## 2016-02-24 ENCOUNTER — Ambulatory Visit (INDEPENDENT_AMBULATORY_CARE_PROVIDER_SITE_OTHER): Payer: BC Managed Care – PPO | Admitting: Podiatry

## 2016-02-24 ENCOUNTER — Ambulatory Visit (INDEPENDENT_AMBULATORY_CARE_PROVIDER_SITE_OTHER): Payer: BC Managed Care – PPO

## 2016-02-24 ENCOUNTER — Encounter: Payer: Self-pay | Admitting: Podiatry

## 2016-02-24 VITALS — HR 97 | Resp 16 | Ht 69.0 in | Wt 238.0 lb

## 2016-02-24 DIAGNOSIS — M729 Fibroblastic disorder, unspecified: Secondary | ICD-10-CM | POA: Diagnosis not present

## 2016-02-24 DIAGNOSIS — M79671 Pain in right foot: Secondary | ICD-10-CM

## 2016-02-24 DIAGNOSIS — S98132A Complete traumatic amputation of one left lesser toe, initial encounter: Secondary | ICD-10-CM | POA: Diagnosis not present

## 2016-02-24 DIAGNOSIS — S98139A Complete traumatic amputation of one unspecified lesser toe, initial encounter: Secondary | ICD-10-CM | POA: Diagnosis not present

## 2016-02-24 NOTE — Progress Notes (Signed)
   Subjective:    Patient ID: Danny Fuller, male    DOB: 1975-10-13, 41 y.o.   MRN: [id number]  HPI  Chief Complaint  Patient presents with  . Foot Pain    BL; Right foot, Plantar Forefoot and Bottom of heel x 6 months. Left foot; Medial and Lateral Sides of Foot and Pain where amputation of 5th toe. Pt had amputation in 07/2015 Pt states that he has clear/ light yellow drainage at the site when he walks a lot and it is difficult to walk since surgery.   . Peripheral Neuropathy    BL.   . Diabetes    Type 2 Diabetic; Last A1C -13.1        Review of Systems     Objective:   Physical Exam        Assessment & Plan:

## 2016-02-25 NOTE — Progress Notes (Signed)
Subjective:     Patient ID: Danny Fuller, male   DOB: 04-17-1975, 41 y.o.   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      Objective:   Physical Exam  Constitutional: He is oriented to person, place, and time.  Cardiovascular: Intact distal pulses.   Musculoskeletal: Normal range of motion.  Neurological: He is oriented to person, place, and time.  Skin: Skin is warm and dry.  Nursing note and vitals reviewed.  vascular status was found to be intact and patient's neurological sensation is down as far as sharp Dole vibratory and DTR reflexes. Patient is noted to have a incision on the left lateral foot were fifth metatarsal and digit were removed and there is one small little area where there is localized drainage but no proximal edema erythema or drainage was noted currently. Patient does have pain in the plantar of both feet with the right heel being worse but there is no distinct pattern and it appears to be related more to his out-of-control sharp     Assessment:     Diabetic who last A1c was 13 with chronic foot pain and history of amputation    Plan:     H&P conditions reviewed and at this time I have recommended very soft orthotics to try to reduce plantar pressure with consideration long-term for diabetic shoes. Patient is scanned today and I did discuss daily inspections of his feet and I did apply a small amount of Iodosorb to the area that had localized drainage and applied padding around it to try to cushion. I would like for him to be able to return to work some day and I discussed at great length the importance of keeping his sugar under control  X-ray report indicated resection of fifth  metatarsal down to the base of the shaft fifth and fifth digit with no other current pathology noted

## 2016-03-03 ENCOUNTER — Encounter (HOSPITAL_COMMUNITY): Payer: Self-pay | Admitting: Emergency Medicine

## 2016-03-03 DIAGNOSIS — M549 Dorsalgia, unspecified: Secondary | ICD-10-CM

## 2016-03-03 DIAGNOSIS — Z5321 Procedure and treatment not carried out due to patient leaving prior to being seen by health care provider: Secondary | ICD-10-CM

## 2016-03-03 DIAGNOSIS — A419 Sepsis, unspecified organism: Secondary | ICD-10-CM | POA: Diagnosis not present

## 2016-03-03 DIAGNOSIS — N12 Tubulo-interstitial nephritis, not specified as acute or chronic: Secondary | ICD-10-CM | POA: Diagnosis not present

## 2016-03-03 LAB — CBC
HCT: 35.3 % — ABNORMAL LOW (ref 39.0–52.0)
HEMOGLOBIN: 12.3 g/dL — AB (ref 13.0–17.0)
MCH: 29.6 pg (ref 26.0–34.0)
MCHC: 34.8 g/dL (ref 30.0–36.0)
MCV: 84.9 fL (ref 78.0–100.0)
PLATELETS: 303 10*3/uL (ref 150–400)
RBC: 4.16 MIL/uL — AB (ref 4.22–5.81)
RDW: 13.3 % (ref 11.5–15.5)
WBC: 14 10*3/uL — AB (ref 4.0–10.5)

## 2016-03-03 LAB — COMPREHENSIVE METABOLIC PANEL
ALBUMIN: 3.8 g/dL (ref 3.5–5.0)
ALK PHOS: 82 U/L (ref 38–126)
ALT: 13 U/L — AB (ref 17–63)
AST: 14 U/L — AB (ref 15–41)
Anion gap: 10 (ref 5–15)
BUN: 18 mg/dL (ref 6–20)
CHLORIDE: 97 mmol/L — AB (ref 101–111)
CO2: 23 mmol/L (ref 22–32)
CREATININE: 1.22 mg/dL (ref 0.61–1.24)
Calcium: 9.3 mg/dL (ref 8.9–10.3)
GFR calc non Af Amer: >60 mL/min (ref >60)
GLUCOSE: 302 mg/dL — AB (ref 65–99)
Potassium: 4 mmol/L (ref 3.5–5.1)
SODIUM: 130 mmol/L — AB (ref 135–145)
Total Bilirubin: 0.5 mg/dL (ref 0.3–1.2)
Total Protein: 7.7 g/dL (ref 6.5–8.1)

## 2016-03-03 LAB — CBG MONITORING, ED: GLUCOSE-CAPILLARY: 304 mg/dL — AB (ref 65–99)

## 2016-03-03 MED ORDER — ACETAMINOPHEN 325 MG PO TABS
650.0000 mg | ORAL_TABLET | Freq: Once | ORAL | Status: AC
  Administered 2016-03-03: 650 mg via ORAL

## 2016-03-03 MED ORDER — ONDANSETRON 4 MG PO TBDP
ORAL_TABLET | ORAL | Status: AC
  Filled 2016-03-03: qty 1

## 2016-03-03 MED ORDER — ONDANSETRON HCL 4 MG PO TABS
4.0000 mg | ORAL_TABLET | Freq: Once | ORAL | Status: AC
  Administered 2016-03-03: 4 mg via ORAL

## 2016-03-03 MED ORDER — ACETAMINOPHEN 325 MG PO TABS
ORAL_TABLET | ORAL | Status: AC
  Filled 2016-03-03: qty 2

## 2016-03-03 NOTE — ED Triage Notes (Signed)
Pt presents to ED for assessment of back pain, neck pain, muscle spasms in his legs, fatigue and hyperglycemia.  Pt has a recent diagnosis of kidney failure, and pt is concerned for kidney function.  Pt sts "none of my home medicines are working".  Pt denies any changes in urination.  Pt c/o n/v, denies diarrhea.  Pt c/o fatigue and anorexia.

## 2016-03-04 ENCOUNTER — Inpatient Hospital Stay (HOSPITAL_COMMUNITY)
Admission: EM | Admit: 2016-03-04 | Discharge: 2016-03-15 | DRG: 853 | Disposition: A | Discharge status: Home Health | Payer: BC Managed Care – PPO | Attending: Internal Medicine | Admitting: Internal Medicine

## 2016-03-04 ENCOUNTER — Emergency Department (HOSPITAL_COMMUNITY): Payer: BC Managed Care – PPO

## 2016-03-04 ENCOUNTER — Encounter (HOSPITAL_COMMUNITY): Payer: Self-pay

## 2016-03-04 ENCOUNTER — Emergency Department (HOSPITAL_COMMUNITY)
Admission: EM | Admit: 2016-03-04 | Discharge: 2016-03-04 | Disposition: A | Discharge status: Home / Self Care | Payer: BC Managed Care – PPO | Source: Home / Self Care

## 2016-03-04 DIAGNOSIS — E1152 Type 2 diabetes mellitus with diabetic peripheral angiopathy with gangrene: Secondary | ICD-10-CM | POA: Diagnosis present

## 2016-03-04 DIAGNOSIS — Z87891 Personal history of nicotine dependence: Secondary | ICD-10-CM

## 2016-03-04 DIAGNOSIS — E669 Obesity, unspecified: Secondary | ICD-10-CM | POA: Diagnosis present

## 2016-03-04 DIAGNOSIS — I9581 Postprocedural hypotension: Secondary | ICD-10-CM | POA: Diagnosis present

## 2016-03-04 DIAGNOSIS — N493 Fournier gangrene: Secondary | ICD-10-CM | POA: Diagnosis present

## 2016-03-04 DIAGNOSIS — D649 Anemia, unspecified: Secondary | ICD-10-CM | POA: Diagnosis present

## 2016-03-04 DIAGNOSIS — Z8249 Family history of ischemic heart disease and other diseases of the circulatory system: Secondary | ICD-10-CM

## 2016-03-04 DIAGNOSIS — E222 Syndrome of inappropriate secretion of antidiuretic hormone: Secondary | ICD-10-CM | POA: Diagnosis present

## 2016-03-04 DIAGNOSIS — M6702 Short Achilles tendon (acquired), left ankle: Secondary | ICD-10-CM | POA: Diagnosis present

## 2016-03-04 DIAGNOSIS — R51 Headache: Secondary | ICD-10-CM | POA: Diagnosis present

## 2016-03-04 DIAGNOSIS — E1142 Type 2 diabetes mellitus with diabetic polyneuropathy: Secondary | ICD-10-CM | POA: Diagnosis present

## 2016-03-04 DIAGNOSIS — Z833 Family history of diabetes mellitus: Secondary | ICD-10-CM

## 2016-03-04 DIAGNOSIS — E876 Hypokalemia: Secondary | ICD-10-CM | POA: Diagnosis present

## 2016-03-04 DIAGNOSIS — A419 Sepsis, unspecified organism: Principal | ICD-10-CM | POA: Diagnosis present

## 2016-03-04 DIAGNOSIS — Z6837 Body mass index (BMI) 37.0-37.9, adult: Secondary | ICD-10-CM

## 2016-03-04 DIAGNOSIS — Z794 Long term (current) use of insulin: Secondary | ICD-10-CM

## 2016-03-04 DIAGNOSIS — L97509 Non-pressure chronic ulcer of other part of unspecified foot with unspecified severity: Secondary | ICD-10-CM | POA: Diagnosis not present

## 2016-03-04 DIAGNOSIS — Z823 Family history of stroke: Secondary | ICD-10-CM | POA: Diagnosis not present

## 2016-03-04 DIAGNOSIS — Z8261 Family history of arthritis: Secondary | ICD-10-CM | POA: Diagnosis not present

## 2016-03-04 DIAGNOSIS — Z79899 Other long term (current) drug therapy: Secondary | ICD-10-CM

## 2016-03-04 DIAGNOSIS — M726 Necrotizing fasciitis: Secondary | ICD-10-CM | POA: Diagnosis present

## 2016-03-04 DIAGNOSIS — E11621 Type 2 diabetes mellitus with foot ulcer: Secondary | ICD-10-CM | POA: Diagnosis present

## 2016-03-04 DIAGNOSIS — I10 Essential (primary) hypertension: Secondary | ICD-10-CM | POA: Diagnosis present

## 2016-03-04 DIAGNOSIS — E1121 Type 2 diabetes mellitus with diabetic nephropathy: Secondary | ICD-10-CM | POA: Diagnosis present

## 2016-03-04 DIAGNOSIS — K219 Gastro-esophageal reflux disease without esophagitis: Secondary | ICD-10-CM | POA: Diagnosis present

## 2016-03-04 DIAGNOSIS — E1140 Type 2 diabetes mellitus with diabetic neuropathy, unspecified: Secondary | ICD-10-CM

## 2016-03-04 DIAGNOSIS — N10 Acute tubulo-interstitial nephritis: Secondary | ICD-10-CM | POA: Diagnosis present

## 2016-03-04 DIAGNOSIS — L0231 Cutaneous abscess of buttock: Secondary | ICD-10-CM

## 2016-03-04 DIAGNOSIS — L02215 Cutaneous abscess of perineum: Secondary | ICD-10-CM | POA: Diagnosis present

## 2016-03-04 DIAGNOSIS — Z89429 Acquired absence of other toe(s), unspecified side: Secondary | ICD-10-CM | POA: Diagnosis not present

## 2016-03-04 DIAGNOSIS — Z89422 Acquired absence of other left toe(s): Secondary | ICD-10-CM

## 2016-03-04 DIAGNOSIS — N12 Tubulo-interstitial nephritis, not specified as acute or chronic: Secondary | ICD-10-CM | POA: Diagnosis present

## 2016-03-04 DIAGNOSIS — F17211 Nicotine dependence, cigarettes, in remission: Secondary | ICD-10-CM | POA: Diagnosis not present

## 2016-03-04 DIAGNOSIS — B9689 Other specified bacterial agents as the cause of diseases classified elsewhere: Secondary | ICD-10-CM | POA: Diagnosis not present

## 2016-03-04 DIAGNOSIS — E1165 Type 2 diabetes mellitus with hyperglycemia: Secondary | ICD-10-CM | POA: Diagnosis present

## 2016-03-04 DIAGNOSIS — M7918 Myalgia, other site: Secondary | ICD-10-CM

## 2016-03-04 LAB — I-STAT CG4 LACTIC ACID, ED
Lactic Acid, Venous: 1.15 mmol/L (ref 0.5–1.9)
Lactic Acid, Venous: 2.83 mmol/L (ref 0.5–1.9)

## 2016-03-04 LAB — COMPREHENSIVE METABOLIC PANEL
ALBUMIN: 4.2 g/dL (ref 3.5–5.0)
ALT: 15 U/L — ABNORMAL LOW (ref 17–63)
AST: 18 U/L (ref 15–41)
Alkaline Phosphatase: 91 U/L (ref 38–126)
Anion gap: 10 (ref 5–15)
BILIRUBIN TOTAL: 1.2 mg/dL (ref 0.3–1.2)
BUN: 16 mg/dL (ref 6–20)
CHLORIDE: 96 mmol/L — AB (ref 101–111)
CO2: 23 mmol/L (ref 22–32)
Calcium: 9.1 mg/dL (ref 8.9–10.3)
Creatinine, Ser: 1.21 mg/dL (ref 0.61–1.24)
GFR calc Af Amer: >60 mL/min (ref >60)
GFR calc non Af Amer: >60 mL/min (ref >60)
GLUCOSE: 395 mg/dL — AB (ref 65–99)
Potassium: 4.2 mmol/L (ref 3.5–5.1)
SODIUM: 129 mmol/L — AB (ref 135–145)
Total Protein: 8.1 g/dL (ref 6.5–8.1)

## 2016-03-04 LAB — URINALYSIS, ROUTINE W REFLEX MICROSCOPIC
BILIRUBIN URINE: NEGATIVE
HGB URINE DIPSTICK: NEGATIVE
Ketones, ur: NEGATIVE mg/dL
LEUKOCYTES UA: NEGATIVE
NITRITE: NEGATIVE
PH: 6 (ref 5.0–8.0)
Protein, ur: NEGATIVE mg/dL
SPECIFIC GRAVITY, URINE: 1.011 (ref 1.005–1.030)
Squamous Epithelial / LPF: NONE SEEN

## 2016-03-04 LAB — LIPASE, BLOOD: LIPASE: 16 U/L (ref 11–51)

## 2016-03-04 LAB — CBC
HCT: 36.9 % — ABNORMAL LOW (ref 39.0–52.0)
Hemoglobin: 13 g/dL (ref 13.0–17.0)
MCH: 30 pg (ref 26.0–34.0)
MCHC: 35.2 g/dL (ref 30.0–36.0)
MCV: 85.2 fL (ref 78.0–100.0)
PLATELETS: 304 10*3/uL (ref 150–400)
RBC: 4.33 MIL/uL (ref 4.22–5.81)
RDW: 12.9 % (ref 11.5–15.5)
WBC: 20.7 10*3/uL — AB (ref 4.0–10.5)

## 2016-03-04 LAB — CBG MONITORING, ED: GLUCOSE-CAPILLARY: 335 mg/dL — AB (ref 65–99)

## 2016-03-04 MED ORDER — IOPAMIDOL (ISOVUE-300) INJECTION 61%
INTRAVENOUS | Status: AC
  Administered 2016-03-04: 100 mL via INTRAVENOUS
  Filled 2016-03-04: qty 100

## 2016-03-04 MED ORDER — ACETAMINOPHEN 500 MG PO TABS
1000.0000 mg | ORAL_TABLET | Freq: Once | ORAL | Status: AC
  Administered 2016-03-04: 1000 mg via ORAL
  Filled 2016-03-04: qty 2

## 2016-03-04 MED ORDER — MORPHINE SULFATE (PF) 4 MG/ML IV SOLN
4.0000 mg | Freq: Once | INTRAVENOUS | Status: AC
  Administered 2016-03-04: 4 mg via INTRAVENOUS
  Filled 2016-03-04: qty 1

## 2016-03-04 MED ORDER — SODIUM CHLORIDE 0.9 % IV BOLUS (SEPSIS)
500.0000 mL | Freq: Once | INTRAVENOUS | Status: AC
  Administered 2016-03-04: 500 mL via INTRAVENOUS

## 2016-03-04 MED ORDER — DEXTROSE 5 % IV SOLN
2.0000 g | Freq: Once | INTRAVENOUS | Status: AC
  Administered 2016-03-04: 2 g via INTRAVENOUS
  Filled 2016-03-04: qty 2

## 2016-03-04 MED ORDER — SODIUM CHLORIDE 0.9 % IV BOLUS (SEPSIS)
1000.0000 mL | Freq: Once | INTRAVENOUS | Status: AC
  Administered 2016-03-04: 1000 mL via INTRAVENOUS

## 2016-03-04 MED ORDER — ONDANSETRON HCL 4 MG/2ML IJ SOLN
4.0000 mg | Freq: Once | INTRAMUSCULAR | Status: AC
  Administered 2016-03-04: 4 mg via INTRAVENOUS
  Filled 2016-03-04: qty 2

## 2016-03-04 NOTE — ED Notes (Signed)
Pt's family came to [facility]st to express her concern about her husbands care. I noted to his family that I would like him to stay if they are that concerned about his health. Pt's wife said they may go to [facility], but would rather just stay here because he is a dialysis pt. Pt's family now irate and yelling at staff.

## 2016-03-04 NOTE — ED Notes (Signed)
Offered patient Zofran ODT as per protocol, but refused.

## 2016-03-04 NOTE — ED Triage Notes (Signed)
Patient reports that his blood sugar has been greater than 600 x 2 days at home. Patient also c/o nausea, abdominal pain, and dizziness.

## 2016-03-04 NOTE — ED Notes (Addendum)
Patient's family member not calling this RN "that bitch" repeatedly in lobby.  Security present.

## 2016-03-04 NOTE — ED Provider Notes (Signed)
[facility] DEPT Provider Note   CSN: [id number] Arrival date & time: 03/04/16  1536     History   Chief Complaint Chief Complaint  Patient presents with  . Hyperglycemia  . Back Pain  . Nausea  . Dizziness    HPI Danny Fuller is a 41 y.o. male.  HPI 41 year old male who presents with right-sided back pain, hyperglycemia, and dizziness. He has a history of diabetes and hypertension. States that he has been feeling unwell over the past 2-3 days. Reports that blood sugar has been greater than 600 at home during this time and he's been having right-sided abdominal and back pain and dizziness. No vomiting or diarrhea. Urinary frequency but no dysuria or hematuria. No known fevers or chills. No cough, congestion, runny nose, chest pain or difficulty breathing.   Past Medical History:  Diagnosis Date  . Asthma   . Diabetes mellitus    sees Dr. Dwyane Dee   . GERD (gastroesophageal reflux disease)   . Headache(784.0)   . Hypertension     Patient Active Problem List   Diagnosis Date Noted  . Acquired contracture of Achilles tendon, left 02/15/2016  . Arthralgia of multiple joints 01/19/2016  . Diabetic polyneuropathy associated with type 2 diabetes mellitus ([place]) 01/18/2016  . Acquired absence of other left toe(s) ([place]) 01/18/2016  . Foot amputation status, left ([place]) 01/04/2016  . Peripheral neuropathy ([place]) 11/11/2015  . Diabetic foot infection ([place]) 10/11/2015  . Acute kidney injury superimposed on chronic kidney disease ([place]) 10/11/2015  . Asthma 07/23/2015  . Chronic headache 07/23/2015  . Erectile disorder due to medical condition in male 04/23/2015  . BMI 33.0-33.9,adult 04/21/2012  . GERD 04/21/2008  . Type 2 diabetes mellitus with diabetic neuropathy, with long-term current use of insulin ([place]) 11/21/2006  . Essential hypertension 11/21/2006  . LUMBAR STRAIN 11/21/2006    Past Surgical History:  Procedure Laterality Date  . AMPUTATION Left 07/26/2015   Procedure: AMPUTATION LEFT FIFTH RAY;  Surgeon: [name], MD;  Location: [facility];  Service: Orthopedics;  Laterality: Left;  . bilateral hip pins placed         Home Medications    Prior to Admission medications   Medication Sig Start Date End Date Taking? Authorizing Provider  amLODipine (NORVASC) 10 MG tablet Take 1 tablet (10 mg total) by mouth daily. 11/10/15  Yes Orson Eva, MD  cloNIDine (CATAPRES) 0.1 MG tablet Take 0.1 mg by mouth 2 (two) times daily. 12/22/15  Yes Historical Provider, MD  glucose blood (ONETOUCH VERIO) test strip Use to check blood sugar 1 time per day. 09/16/15  Yes [name], MD  hydrALAZINE (APRESOLINE) 25 MG tablet Take 1 tablet (25 mg total) by mouth every 8 (eight) hours. 12/22/15  Yes [name], MD  insulin aspart (NOVOLOG FLEXPEN) 100 UNIT/ML FlexPen Before each meal 3 times a day, 140-199 - 2 units, 200-250 - 4 units, 251-299 - 6 units,  300-349 - 8 units,  350 or above 10 units. Insulin PEN if approved, provide syringes and needles if needed. Patient taking differently: Inject 2-10 Units into the skin 3 (three) times daily with meals. Before each meal 3 times a day, 140-199 - 2 units, 200-250 - 4 units, 251-299 - 6 units,  300-349 - 8 units,  350 or above 10 units. Insulin PEN if approved, provide syringes and needles if needed. 07/29/15  Yes [name], MD  Insulin Glargine (LANTUS) 100 UNIT/ML Solostar Pen Inject 20 Units into the skin  daily at 10 pm. 07/29/15  Yes [name], MD  labetalol (NORMODYNE) 200 MG tablet Take 1 tablet (200 mg total) by mouth 2 (two) times daily. 11/10/15  Yes Orson Eva, MD  lisinopril (PRINIVIL,ZESTRIL) 20 MG tablet Take 1 tablet (20 mg total) by mouth daily. 01/19/16  Yes [name], MD  pregabalin (LYRICA) 75 MG capsule Take 1 capsule (75 mg total) by mouth 2 (two) times daily. 01/31/16  Yes Michel Bickers, MD  traMADol (ULTRAM) 50 MG tablet Take 2 tablets (100 mg total) by mouth every 6 (six) hours as needed for moderate pain.  01/19/16  Yes [name], MD  collagenase (SANTYL) ointment Apply 1 application topically daily. Wash left foot with soap and water daily dry the foot and then apply Santyl to a dry gauze dressing apply Ace wrap continue nonweightbearing on the left foot change dressing daily. Patient not taking: Reported on 03/04/2016 10/11/15   [name], MD  Insulin Pen Needle (PEN NEEDLES) 31G X 6 MM MISC For a month supply Patient not taking: Reported on 03/04/2016 07/29/15   [name], MD    Family History Family History  Problem Relation Age of Onset  . Arthritis    . Diabetes    . Hypertension    . Hyperlipidemia    . Stroke    . Sudden death    . Diabetes Mother   . Diabetes Sister   . Diabetes Brother     Social History Social History  Substance Use Topics  . Smoking status: Former Smoker    Types: Cigarettes  . Smokeless tobacco: Never Used     Comment: quit 5 days ago  . Alcohol use No     Allergies   Patient has no known allergies.   Review of Systems Review of Systems 10/14 systems reviewed and are negative other than those stated in the HPI   Physical Exam Updated Vital Signs BP 137/88 (BP Location: Left Arm)   Pulse (!) 126   Temp 102.8 F (39.3 C) (Oral)   Resp 16   Ht 5\' 9"  (1.753 m)   Wt 238 lb (108 kg)   SpO2 93%   BMI 35.15 kg/m   Physical Exam Physical Exam  Nursing note and vitals reviewed. Constitutional: fatigued and low energy, non-toxic, and in no acute distress Head: Normocephalic and atraumatic.  Mouth/Throat: Oropharynx is clear and dry.  Neck: Normal range of motion. Neck supple.  Cardiovascular: Tachycardic rate and regular rhythm.  no edema Pulmonary/Chest: Effort normal and breath sounds normal.  Abdominal: Soft. There is right flank and RLQ tenderness. There is no rebound and no guarding.  Musculoskeletal: Normal range of motion.  Neurological: Alert, no facial droop, fluent speech, moves all extremities symmetrically Skin: Skin is  warm and dry.  Psychiatric: Cooperative   ED Treatments / Results  Labs (all labs ordered are listed, but only abnormal results are displayed) Labs Reviewed  COMPREHENSIVE METABOLIC PANEL - Abnormal; Notable for the following:       Result Value   Sodium 129 (*)    Chloride 96 (*)    Glucose, Bld 395 (*)    ALT 15 (*)    All other components within normal limits  CBC - Abnormal; Notable for the following:    WBC 20.7 (*)    HCT 36.9 (*)    All other components within normal limits  URINALYSIS, ROUTINE W REFLEX MICROSCOPIC - Abnormal; Notable for the following:    Color,  Urine STRAW (*)    Glucose, UA >=500 (*)    Bacteria, UA RARE (*)    All other components within normal limits  CBG MONITORING, ED - Abnormal; Notable for the following:    Glucose-Capillary 335 (*)    All other components within normal limits  I-STAT CG4 LACTIC ACID, ED - Abnormal; Notable for the following:    Lactic Acid, Venous 2.83 (*)    All other components within normal limits  CULTURE, BLOOD (ROUTINE X 2)  CULTURE, BLOOD (ROUTINE X 2)  URINE CULTURE  LIPASE, BLOOD  I-STAT CG4 LACTIC ACID, ED    EKG  EKG Interpretation None       Radiology Dg Chest 2 View  Result Date: 03/04/2016 CLINICAL DATA:  Fever, sepsis.  Hyperglycemia and dizziness. EXAM: CHEST  2 VIEW COMPARISON:  Radiographs 12/29/2015. FINDINGS: The cardiomediastinal contours are normal. Lung volumes are low. Pulmonary vasculature is normal. No consolidation, pleural effusion, or pneumothorax. No acute osseous abnormalities are seen. IMPRESSION: Low lung volumes without acute abnormality. Electronically Signed   By: Jeb Levering M.D.   On: 03/04/2016 22:30   Ct Abdomen Pelvis W Contrast  Result Date: 03/04/2016 CLINICAL DATA:  Right-sided abdominal and flank pain. Fever and leukocytosis. EXAM: CT ABDOMEN AND PELVIS WITH CONTRAST TECHNIQUE: Multidetector CT imaging of the abdomen and pelvis was performed using the standard  protocol following bolus administration of intravenous contrast. CONTRAST:  100 cc Isovue-300 IV COMPARISON:  None. FINDINGS: Lower chest: The lung bases are clear.  No pleural fluid. Hepatobiliary: Decreased hepatic density consistent with steatosis. No focal lesion. Gallbladder physiologically distended, no calcified stone. No biliary dilatation. Pancreas: No ductal dilatation or inflammation. Spleen: Normal in size without focal abnormality. Adrenals/Urinary Tract: Edematous appearance of the right kidney with mild perinephric edema. No intrarenal or perirenal fluid collection. Trace stranding about the left kidney. No hydronephrosis. Simple cyst in the lower left kidney. Urinary bladder is physiologically distended with mild wall thickening. Normal adrenal glands. Stomach/Bowel: Stomach is within normal limits. Appendix appears normal. No evidence of bowel wall thickening, distention, or inflammatory changes. Colonic tortuosity. Diverticulosis of the descending colon without acute inflammation. Vascular/Lymphatic: Age advanced atheroscleroses of the abdominal aorta and its branches. Mildly prominent retroperitoneal and bilateral external iliac nodes are likely reactive. Reproductive: Prostate is unremarkable. Other: No free air, free fluid, or intra-abdominal fluid collection. Small fat containing umbilical hernia contains noninflamed small bowel. Musculoskeletal: There are no acute or suspicious osseous abnormalities. IMPRESSION: 1. Right pyelonephritis and probable cystitis. Minimal stranding about the left kidney may reflect mild left-sided pyelonephritis. 2. Hepatic steatosis. 3. Colonic diverticulosis without acute inflammation. 4. Age advanced atherosclerosis. Electronically Signed   By: Jeb Levering M.D.   On: 03/04/2016 22:55    Procedures Procedures (including critical care time) CRITICAL CARE Performed by: Forde Dandy   Total critical care time: 40 minutes  Critical care time was  exclusive of separately billable procedures and treating other patients.  Critical care was necessary to treat or prevent imminent or life-threatening deterioration.  Critical care was time spent personally by me on the following activities: development of treatment plan with patient and/or surrogate as well as nursing, discussions with consultants, evaluation of patient's response to treatment, examination of patient, obtaining history from patient or surrogate, ordering and performing treatments and interventions, ordering and review of laboratory studies, ordering and review of radiographic studies, pulse oximetry and re-evaluation of patient's condition.  Medications Ordered in ED Medications  sodium chloride 0.9 %  bolus 500 mL (not administered)  sodium chloride 0.9 % bolus 1,000 mL (not administered)  cefTRIAXone (ROCEPHIN) 2 g in dextrose 5 % 50 mL IVPB (not administered)  sodium chloride 0.9 % bolus 1,000 mL (1,000 mLs Intravenous New Bag/Given 03/04/16 2145)  acetaminophen (TYLENOL) tablet 1,000 mg (1,000 mg Oral Given 03/04/16 2133)  sodium chloride 0.9 % bolus 1,000 mL (1,000 mLs Intravenous New Bag/Given 03/04/16 2146)  morphine 4 MG/ML injection 4 mg (4 mg Intravenous Given 03/04/16 2148)  ondansetron (ZOFRAN) injection 4 mg (4 mg Intravenous Given 03/04/16 2147)  iopamidol (ISOVUE-300) 61 % injection (100 mLs Intravenous Contrast Given 03/04/16 2203)     Initial Impression / Assessment and Plan / ED Course  I have reviewed the triage vital signs and the nursing notes.  Pertinent labs & imaging results that were available during my care of the patient were reviewed by me and considered in my medical decision making (see chart for details).     Develops fever and tachycardia in ED with leukocytosis of 20. Lactate 2.8. UA w/o infection/pyelonephritis and no blood to suggest kidney stone. RLQ pain and right flank pain concerning for appendicitis or colitis. Will obtain CT abd/pelvis.  With hyperglycemia 300s, but no evidence of DKA.   CT abd/pelvis visualized. With right sided pyelonephritis and cystitis, even though UA is not convincing. Given ceftriaxone. With sepsis. Will admit for treatment. Discussed with Dr. Eulas Post    Final Clinical Impressions(s) / ED Diagnoses   Final diagnoses:  Pyelonephritis  Sepsis, due to unspecified organism [facility])    New Prescriptions New Prescriptions   No medications on file     Forde Dandy, MD 03/04/16 2335

## 2016-03-04 NOTE — ED Notes (Signed)
Patient asked RN if he could be provided with a snack.  This RN informed patient he was NPO.  Patient ordered pizza, delivered to lobby, and is currently eating pizza.

## 2016-03-05 ENCOUNTER — Encounter (HOSPITAL_COMMUNITY): Payer: Self-pay

## 2016-03-05 DIAGNOSIS — N12 Tubulo-interstitial nephritis, not specified as acute or chronic: Secondary | ICD-10-CM

## 2016-03-05 LAB — BASIC METABOLIC PANEL
Anion gap: 7 (ref 5–15)
BUN: 13 mg/dL (ref 6–20)
CHLORIDE: 103 mmol/L (ref 101–111)
CO2: 24 mmol/L (ref 22–32)
CREATININE: 0.94 mg/dL (ref 0.61–1.24)
Calcium: 8.4 mg/dL — ABNORMAL LOW (ref 8.9–10.3)
GFR calc non Af Amer: >60 mL/min (ref >60)
Glucose, Bld: 332 mg/dL — ABNORMAL HIGH (ref 65–99)
Potassium: 4.3 mmol/L (ref 3.5–5.1)
Sodium: 134 mmol/L — ABNORMAL LOW (ref 135–145)

## 2016-03-05 LAB — CBC WITH DIFFERENTIAL/PLATELET
BASOS PCT: 0 %
Basophils Absolute: 0 10*3/uL (ref 0.0–0.1)
Eosinophils Absolute: 0 10*3/uL (ref 0.0–0.7)
Eosinophils Relative: 0 %
HCT: 30.8 % — ABNORMAL LOW (ref 39.0–52.0)
HEMOGLOBIN: 10.9 g/dL — AB (ref 13.0–17.0)
LYMPHS ABS: 3.1 10*3/uL (ref 0.7–4.0)
Lymphocytes Relative: 16 %
MCH: 30 pg (ref 26.0–34.0)
MCHC: 35.4 g/dL (ref 30.0–36.0)
MCV: 84.8 fL (ref 78.0–100.0)
MONOS PCT: 10 %
Monocytes Absolute: 2 10*3/uL — ABNORMAL HIGH (ref 0.1–1.0)
NEUTROS ABS: 14.9 10*3/uL — AB (ref 1.7–7.7)
NEUTROS PCT: 74 %
Platelets: 275 10*3/uL (ref 150–400)
RBC: 3.63 MIL/uL — ABNORMAL LOW (ref 4.22–5.81)
RDW: 12.8 % (ref 11.5–15.5)
WBC: 20.1 10*3/uL — ABNORMAL HIGH (ref 4.0–10.5)

## 2016-03-05 LAB — GLUCOSE, CAPILLARY
GLUCOSE-CAPILLARY: 385 mg/dL — AB (ref 65–99)
GLUCOSE-CAPILLARY: 300 mg/dL — AB (ref 65–99)
GLUCOSE-CAPILLARY: 311 mg/dL — AB (ref 65–99)
Glucose-Capillary: 321 mg/dL — ABNORMAL HIGH (ref 65–99)
Glucose-Capillary: 273 mg/dL — ABNORMAL HIGH (ref 65–99)

## 2016-03-05 LAB — PROCALCITONIN: PROCALCITONIN: 0.65 ng/mL

## 2016-03-05 MED ORDER — ACETAMINOPHEN 650 MG RE SUPP: 650.0000 mg | Freq: Four times a day (QID) | RECTAL | Status: DC | PRN

## 2016-03-05 MED ORDER — DEXTROSE 5 % IV SOLN
2.0000 g | Freq: Two times a day (BID) | INTRAVENOUS | Status: DC
  Filled 2016-03-05: qty 2

## 2016-03-05 MED ORDER — DEXTROSE 5 % IV SOLN
2.0000 g | Freq: Once | INTRAVENOUS | Status: AC
  Administered 2016-03-05: 2 g via INTRAVENOUS
  Filled 2016-03-05: qty 2

## 2016-03-05 MED ORDER — LABETALOL HCL 200 MG PO TABS
200.0000 mg | ORAL_TABLET | Freq: Two times a day (BID) | ORAL | Status: DC
  Administered 2016-03-05 – 2016-03-06 (×5): 200 mg via ORAL
  Filled 2016-03-05 (×5): qty 1

## 2016-03-05 MED ORDER — INSULIN GLARGINE 100 UNIT/ML ~~LOC~~ SOLN
20.0000 [IU] | Freq: Every day | SUBCUTANEOUS | Status: DC
  Administered 2016-03-05 (×2): 20 [IU] via SUBCUTANEOUS
  Filled 2016-03-05 (×2): qty 0.2

## 2016-03-05 MED ORDER — ACETAMINOPHEN 325 MG PO TABS
650.0000 mg | ORAL_TABLET | ORAL | Status: DC | PRN
Start: 2016-03-05 — End: 2016-03-07
  Administered 2016-03-06 – 2016-03-07 (×4): 650 mg via ORAL
  Filled 2016-03-05 (×5): qty 2

## 2016-03-05 MED ORDER — INSULIN ASPART 100 UNIT/ML ~~LOC~~ SOLN
0.0000 [IU] | Freq: Every day | SUBCUTANEOUS | Status: DC
  Administered 2016-03-05 (×2): 4 [IU] via SUBCUTANEOUS
  Administered 2016-03-06: 3 [IU] via SUBCUTANEOUS
  Administered 2016-03-07 – 2016-03-08 (×2): 2 [IU] via SUBCUTANEOUS

## 2016-03-05 MED ORDER — SODIUM CHLORIDE 0.9% FLUSH
3.0000 mL | Freq: Two times a day (BID) | INTRAVENOUS | Status: DC
  Administered 2016-03-08 – 2016-03-14 (×6): 3 mL via INTRAVENOUS

## 2016-03-05 MED ORDER — IBUPROFEN 200 MG PO TABS
600.0000 mg | ORAL_TABLET | Freq: Once | ORAL | Status: AC
  Administered 2016-03-05: 600 mg via ORAL
  Filled 2016-03-05: qty 3

## 2016-03-05 MED ORDER — LISINOPRIL 20 MG PO TABS
20.0000 mg | ORAL_TABLET | Freq: Every day | ORAL | Status: DC
  Administered 2016-03-05 – 2016-03-15 (×9): 20 mg via ORAL
  Filled 2016-03-05: qty 2
  Filled 2016-03-05 (×4): qty 1
  Filled 2016-03-05: qty 2
  Filled 2016-03-05: qty 1
  Filled 2016-03-05 (×3): qty 2

## 2016-03-05 MED ORDER — TRAMADOL HCL 50 MG PO TABS
100.0000 mg | ORAL_TABLET | Freq: Four times a day (QID) | ORAL | Status: DC | PRN
  Administered 2016-03-05: 100 mg via ORAL
  Filled 2016-03-05 (×2): qty 2

## 2016-03-05 MED ORDER — CLONIDINE HCL 0.1 MG PO TABS
0.1000 mg | ORAL_TABLET | Freq: Two times a day (BID) | ORAL | Status: DC
  Administered 2016-03-05 – 2016-03-15 (×18): 0.1 mg via ORAL
  Filled 2016-03-05 (×21): qty 1

## 2016-03-05 MED ORDER — ONDANSETRON HCL 4 MG/2ML IJ SOLN
4.0000 mg | Freq: Four times a day (QID) | INTRAMUSCULAR | Status: DC | PRN
  Administered 2016-03-07: 4 mg via INTRAVENOUS
  Filled 2016-03-05: qty 2

## 2016-03-05 MED ORDER — ACETAMINOPHEN 325 MG PO TABS
650.0000 mg | ORAL_TABLET | Freq: Four times a day (QID) | ORAL | Status: DC | PRN
  Administered 2016-03-05 (×2): 650 mg via ORAL
  Filled 2016-03-05 (×2): qty 2

## 2016-03-05 MED ORDER — DEXTROSE 5 % IV SOLN
1.0000 g | Freq: Three times a day (TID) | INTRAVENOUS | Status: DC
  Administered 2016-03-05 – 2016-03-07 (×7): 1 g via INTRAVENOUS
  Filled 2016-03-05 (×7): qty 1

## 2016-03-05 MED ORDER — LIP MEDEX EX OINT
1.0000 "application " | TOPICAL_OINTMENT | CUTANEOUS | Status: DC | PRN
  Administered 2016-03-05: 1 via TOPICAL
  Filled 2016-03-05: qty 7

## 2016-03-05 MED ORDER — SODIUM CHLORIDE 0.9 % IV SOLN
INTRAVENOUS | Status: DC
  Administered 2016-03-05 – 2016-03-10 (×11): via INTRAVENOUS

## 2016-03-05 MED ORDER — ENOXAPARIN SODIUM 40 MG/0.4ML ~~LOC~~ SOLN
40.0000 mg | Freq: Every day | SUBCUTANEOUS | Status: DC
  Filled 2016-03-05: qty 0.4

## 2016-03-05 MED ORDER — AMLODIPINE BESYLATE 10 MG PO TABS
10.0000 mg | ORAL_TABLET | Freq: Every day | ORAL | Status: DC
  Administered 2016-03-05 – 2016-03-06 (×2): 10 mg via ORAL
  Filled 2016-03-05 (×2): qty 1

## 2016-03-05 MED ORDER — HYDRALAZINE HCL 25 MG PO TABS
25.0000 mg | ORAL_TABLET | Freq: Three times a day (TID) | ORAL | Status: DC
  Administered 2016-03-05 – 2016-03-07 (×9): 25 mg via ORAL
  Filled 2016-03-05 (×9): qty 1

## 2016-03-05 MED ORDER — PREGABALIN 75 MG PO CAPS
75.0000 mg | ORAL_CAPSULE | Freq: Two times a day (BID) | ORAL | Status: DC
  Administered 2016-03-05 – 2016-03-15 (×16): 75 mg via ORAL
  Filled 2016-03-05 (×19): qty 1

## 2016-03-05 MED ORDER — INSULIN ASPART 100 UNIT/ML ~~LOC~~ SOLN
0.0000 [IU] | Freq: Three times a day (TID) | SUBCUTANEOUS | Status: DC
  Administered 2016-03-05: 11 [IU] via SUBCUTANEOUS
  Administered 2016-03-05: 20 [IU] via SUBCUTANEOUS
  Administered 2016-03-05: 11 [IU] via SUBCUTANEOUS
  Administered 2016-03-06 (×2): 7 [IU] via SUBCUTANEOUS
  Administered 2016-03-06 – 2016-03-07 (×2): 11 [IU] via SUBCUTANEOUS
  Administered 2016-03-07: 4 [IU] via SUBCUTANEOUS
  Administered 2016-03-07: 15 [IU] via SUBCUTANEOUS
  Administered 2016-03-08 – 2016-03-09 (×4): 7 [IU] via SUBCUTANEOUS

## 2016-03-05 MED ORDER — ONDANSETRON HCL 4 MG PO TABS: 4.0000 mg | ORAL_TABLET | Freq: Four times a day (QID) | ORAL | Status: DC | PRN

## 2016-03-05 NOTE — Progress Notes (Signed)
Progress note  Patient admitted earlier this morning. See H&P. Patient with chief complaint of right-sided abdominal pain, right flank pain, urinary frequency and hesitancy. Patient is admitted for sepsis secondary to right-sided pyelonephritis. Currently, patient states that he is no longer having flank pain. He continues to be weak overall.  Continue IV cefepime. Blood culture and urine culture pending at this time  Dessa Phi, DO [facility] [email or web address] Password [facility] 03/05/2016, 12:34 PM

## 2016-03-05 NOTE — Progress Notes (Signed)
Patient has a fever 102. Provider notified

## 2016-03-05 NOTE — H&P (Signed)
History and Physical    Danny Fuller DOB: 11/17/1975 DOA: 03/04/2016  PCP: [name], MD  Nephrology: Jamal Maes Endocrinology: Dwyane Dee  Patient coming from: Home  Chief Complaint: Back pain, urinary frequency  HPI: Danny Fuller is a 41 y.o. 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  He denies dysuria or hematuria.  He has light-headedness but no LOC.  He has had chills and sweats.  ED Course: Patient did not have a documented fever until he presented to the ED today.  Documented fever here to 102.8 associated with tachycardia (HR 130's).  Code Sepsis activated.  The patient has received 3.5L NS.  He has received IV Rocephin 2 grams x one.  He has a WBC count of 20.7.  Sodium 129.  Chloride 96.  BUN 16.  Creatine 1.2.  Initial lactic acid level 2.83.  U/A is not overwhelmingly indicative of infection, but he has an edematous appearance of the right kidney with mild perinephric edema.  There is trace stranding at the left kidney as well.  No hydronephrosis.  Urinary bladder is also distended with mild wall thickening.  Findings consistent with bilateral pyelonephritis and probable cystitis.  Hospitalist asked to admit.  Review of Systems: As per HPI otherwise 10 point review of systems negative.    Past Medical History:  Diagnosis Date  . Asthma   . Diabetes mellitus    sees Dr. Dwyane Dee   . GERD (gastroesophageal reflux disease)   . Headache(784.0)   . Hypertension     Past Surgical History:  Procedure Laterality Date  . AMPUTATION Left 07/26/2015   Procedure: AMPUTATION LEFT FIFTH RAY;  Surgeon: [name], MD;  Location: [facility];  Service: Orthopedics;  Laterality:  Left;  . bilateral hip pins placed       reports that he has quit smoking. His smoking use included Cigarettes. He has never used smokeless tobacco. He reports that he does not drink alcohol or use drugs.  No Known Allergies  Family History  Problem Relation Age of Onset  . Arthritis    . Diabetes    . Hypertension    . Hyperlipidemia    . Stroke    . Sudden death    . Diabetes Mother   . Diabetes Sister   . Diabetes Brother      Prior to Admission medications   Medication Sig Start Date End Date Taking? Authorizing Provider  amLODipine (NORVASC) 10 MG tablet Take 1 tablet (10 mg total) by mouth daily. 11/10/15  Yes Orson Eva, MD  cloNIDine (CATAPRES) 0.1 MG tablet Take 0.1 mg by mouth 2 (two) times daily. 12/22/15  Yes Historical Provider, MD  glucose blood (ONETOUCH VERIO) test strip Use to check blood sugar 1 time per day. 09/16/15  Yes [name], MD  hydrALAZINE (APRESOLINE) 25 MG tablet Take 1 tablet (25 mg total) by mouth every 8 (eight) hours. 12/22/15  Yes [name], MD  insulin aspart (NOVOLOG FLEXPEN) 100 UNIT/ML FlexPen Before each meal 3 times a day, 140-199 - 2 units, 200-250 - 4 units, 251-299 - 6 units,  300-349 - 8 units,  350 or above 10 units. Insulin PEN if approved, provide syringes and needles if needed.  Patient taking differently: Inject 2-10 Units into the skin 3 (three) times daily with meals. Before each meal 3 times a day, 140-199 - 2 units, 200-250 - 4 units, 251-299 - 6 units,  300-349 - 8 units,  350 or above 10 units. Insulin PEN if approved, provide syringes and needles if needed. 07/29/15  Yes [name], MD  Insulin Glargine (LANTUS) 100 UNIT/ML Solostar Pen Inject 20 Units into the skin daily at 10 pm. 07/29/15  Yes [name], MD  labetalol (NORMODYNE) 200 MG tablet Take 1 tablet (200 mg total) by mouth 2 (two) times daily. 11/10/15  Yes Orson Eva, MD  lisinopril (PRINIVIL,ZESTRIL) 20 MG tablet Take 1 tablet (20 mg total) by mouth daily. 01/19/16  Yes  [name], MD  pregabalin (LYRICA) 75 MG capsule Take 1 capsule (75 mg total) by mouth 2 (two) times daily. 01/31/16  Yes Michel Bickers, MD  traMADol (ULTRAM) 50 MG tablet Take 2 tablets (100 mg total) by mouth every 6 (six) hours as needed for moderate pain. 01/19/16  Yes [name], MD    Physical Exam: Vitals:   03/04/16 2151 03/04/16 2327 03/04/16 2333 03/05/16 0000  BP: 137/88 124/75 124/75 129/79  Pulse: (!) 126 111 113 106  Resp: 16 16    Temp: 102.8 F (39.3 C)  99.7 F (37.6 C)   TempSrc: Oral  Oral   SpO2: 93% 97% 98% 98%  Weight:      Height:          Constitutional: NAD, calm but ill appearing Vitals:   03/04/16 2151 03/04/16 2327 03/04/16 2333 03/05/16 0000  BP: 137/88 124/75 124/75 129/79  Pulse: (!) 126 111 113 106  Resp: 16 16    Temp: 102.8 F (39.3 C)  99.7 F (37.6 C)   TempSrc: Oral  Oral   SpO2: 93% 97% 98% 98%  Weight:      Height:       Eyes: PERRL, lids and conjunctivae normal ENMT: Mucous membranes are moist. Posterior pharynx clear of any exudate or lesions. Normal dentition.  Neck: normal appearance, supple Respiratory: clear to auscultation bilaterally, no wheezing, no crackles. Normal respiratory effort. No accessory muscle use.  Cardiovascular: Tachycardic but regular.  No murmurs / rubs / gallops. No extremity edema. 2+ pedal pulses.  GI: abdomen is soft and compressible.  No distention.  No guarding.  He has Right sided tenderness.  No masses palpated.  Bowel sounds are present.  No CVA tenderness (after analgesics). Musculoskeletal:  No joint deformity in upper and lower extremities. Good ROM, no contractures. Normal muscle tone.  Skin: no rashes, warm and dry Neurologic: CN 2-12 grossly intact. Sensation intact, Strength symmetric bilaterally, 5/5.  Psychiatric: Normal judgment and insight. Alert and oriented x 3. Normal mood.     Labs on Admission: I have personally reviewed following labs and imaging studies  CBC:  Recent  Labs Lab 03/03/16 2058 03/04/16 1630  WBC 14.0* 20.7*  HGB 12.3* 13.0  HCT 35.3* 36.9*  MCV 84.9 85.2  PLT 303 [id number]   Basic Metabolic Panel:  Recent Labs Lab 03/03/16 2058 03/04/16 1630  NA 130* 129*  K 4.0 4.2  CL 97* 96*  CO2 23 23  GLUCOSE 302* 395*  BUN 18 16  CREATININE 1.22 1.21  CALCIUM 9.3 9.1   GFR: Estimated Creatinine Clearance: 100.2 mL/min (by C-G formula based on SCr of 1.21 mg/dL). Liver Function Tests:  Recent Labs Lab 03/03/16 2058 03/04/16 1630  AST  14* 18  ALT 13* 15*  ALKPHOS 82 91  BILITOT 0.5 1.2  PROT 7.7 8.1  ALBUMIN 3.8 4.2    Recent Labs Lab 03/04/16 1630  LIPASE 16   CBG:  Recent Labs Lab 03/03/16 2053 03/04/16 1548  GLUCAP 304* 335*   Urine analysis:    Component Value Date/Time   COLORURINE STRAW (A) 03/04/2016 1731   APPEARANCEUR CLEAR 03/04/2016 1731   LABSPEC 1.011 03/04/2016 1731   PHURINE 6.0 03/04/2016 1731   GLUCOSEU >=500 (A) 03/04/2016 1731   HGBUR NEGATIVE 03/04/2016 1731   HGBUR negative 04/17/2008 0929   BILIRUBINUR NEGATIVE 03/04/2016 1731   BILIRUBINUR n 04/23/2013 1350   KETONESUR NEGATIVE 03/04/2016 1731   PROTEINUR NEGATIVE 03/04/2016 1731   UROBILINOGEN 0.2 04/23/2013 1350   UROBILINOGEN 4.0 (H) 08/09/2009 0938   NITRITE NEGATIVE 03/04/2016 1731   LEUKOCYTESUR NEGATIVE 03/04/2016 1731   Sepsis Labs:  Lactic acid level 2.83, repeat 1.15  Radiological Exams on Admission: Dg Chest 2 View  Result Date: 03/04/2016 CLINICAL DATA:  Fever, sepsis.  Hyperglycemia and dizziness. EXAM: CHEST  2 VIEW COMPARISON:  Radiographs 12/29/2015. FINDINGS: The cardiomediastinal contours are normal. Lung volumes are low. Pulmonary vasculature is normal. No consolidation, pleural effusion, or pneumothorax. No acute osseous abnormalities are seen. IMPRESSION: Low lung volumes without acute abnormality. Electronically Signed   By: Jeb Levering M.D.   On: 03/04/2016 22:30   Ct Abdomen Pelvis W Contrast  Result  Date: 03/04/2016 CLINICAL DATA:  Right-sided abdominal and flank pain. Fever and leukocytosis. EXAM: CT ABDOMEN AND PELVIS WITH CONTRAST TECHNIQUE: Multidetector CT imaging of the abdomen and pelvis was performed using the standard protocol following bolus administration of intravenous contrast. CONTRAST:  100 cc Isovue-300 IV COMPARISON:  None. FINDINGS: Lower chest: The lung bases are clear.  No pleural fluid. Hepatobiliary: Decreased hepatic density consistent with steatosis. No focal lesion. Gallbladder physiologically distended, no calcified stone. No biliary dilatation. Pancreas: No ductal dilatation or inflammation. Spleen: Normal in size without focal abnormality. Adrenals/Urinary Tract: Edematous appearance of the right kidney with mild perinephric edema. No intrarenal or perirenal fluid collection. Trace stranding about the left kidney. No hydronephrosis. Simple cyst in the lower left kidney. Urinary bladder is physiologically distended with mild wall thickening. Normal adrenal glands. Stomach/Bowel: Stomach is within normal limits. Appendix appears normal. No evidence of bowel wall thickening, distention, or inflammatory changes. Colonic tortuosity. Diverticulosis of the descending colon without acute inflammation. Vascular/Lymphatic: Age advanced atheroscleroses of the abdominal aorta and its branches. Mildly prominent retroperitoneal and bilateral external iliac nodes are likely reactive. Reproductive: Prostate is unremarkable. Other: No free air, free fluid, or intra-abdominal fluid collection. Small fat containing umbilical hernia contains noninflamed small bowel. Musculoskeletal: There are no acute or suspicious osseous abnormalities. IMPRESSION: 1. Right pyelonephritis and probable cystitis. Minimal stranding about the left kidney may reflect mild left-sided pyelonephritis. 2. Hepatic steatosis. 3. Colonic diverticulosis without acute inflammation. 4. Age advanced atherosclerosis. Electronically  Signed   By: Jeb Levering M.D.   On: 03/04/2016 22:55    Assessment/Plan Principal Problem:   Sepsis ([place]) Active Problems:   Essential hypertension   Diabetic polyneuropathy associated with type 2 diabetes mellitus ([place])   Pyelonephritis      Sepsis secondary to acute pyelonephritis --Change to IV cefepime due to complicated infection --Blood and urine cultures pending --Continue maintenance fluids with NS at 125cc/hr --Check procalcitonin  Uncontrolled IDDM --Continue home dose of lantus for now with aggressive SSI coverage AC/HS --Diabetic diet  HTN --Amlodipine,  clonidine, lisinopril, hydralazine, labetalol  Neuropathy --Lyrica, tramadol  History of AKI secondary to vanc --Patient would like to have his nephrologist notified of admission to the hospital on Monday.   DVT prophylaxis: Lovenox Code Status: FULL Family Communication: Wife present at bedside in the ED at time of admission. Disposition Plan: Expect he will go home when ready for discharge in 2-3 days. Consults called: NONE Admission status: Inpatient, telemetry.  I expect this patient will need inpatient services for greater than two midnights.   TIME SPENT: 70 minutes   Eber Jones MD [facility] Pager [phone]  If 7PM-7AM, please contact night-coverage [email or web address] Password [email or web address]  03/05/2016, 12:19 AM

## 2016-03-05 NOTE — Progress Notes (Signed)
Pharmacy Antibiotic Note  Danny Fuller is a 41 y.o. male admitted on 03/04/2016 with UTI.  Pharmacy has been consulted for Cefepime dosing.  Plan: Cefepime 1gm iv q8hr  Height: 5\' 9"  (175.3 cm) Weight: 238 lb (108 kg) IBW/kg (Calculated) : 70.7  Temp (24hrs), Avg:100.6 F (38.1 C), Min:98.4 F (36.9 C), Max:102.8 F (39.3 C)   Recent Labs Lab 03/03/16 2058 03/04/16 1630 03/04/16 2141 03/04/16 2334 03/05/16 0112  WBC 14.0* 20.7*  --   --  20.1*  CREATININE 1.22 1.21  --   --  0.94  LATICACIDVEN  --   --  2.83* 1.15  --     Estimated Creatinine Clearance: 129 mL/min (by C-G formula based on SCr of 0.94 mg/dL).    No Known Allergies  Antimicrobials this admission: Ceftriaxone 1/20 x1 Cefepime 1/21 >>  Dose adjustments this admission: -  Microbiology results: pending  Thank you for allowing pharmacy to be a part of this patient's care.  Nani Skillern Crowford 03/05/2016 3:39 AM

## 2016-03-06 LAB — CBC WITH DIFFERENTIAL/PLATELET
Basophils Absolute: 0 10*3/uL (ref 0.0–0.1)
Basophils Relative: 0 %
Eosinophils Absolute: 0 10*3/uL (ref 0.0–0.7)
Eosinophils Relative: 0 %
HEMATOCRIT: 27.4 % — AB (ref 39.0–52.0)
Hemoglobin: 9.6 g/dL — ABNORMAL LOW (ref 13.0–17.0)
LYMPHS ABS: 2 10*3/uL (ref 0.7–4.0)
LYMPHS PCT: 11 %
MCH: 29.7 pg (ref 26.0–34.0)
MCHC: 35 g/dL (ref 30.0–36.0)
MCV: 84.8 fL (ref 78.0–100.0)
MONO ABS: 1.9 10*3/uL — AB (ref 0.1–1.0)
MONOS PCT: 10 %
NEUTROS ABS: 14.7 10*3/uL — AB (ref 1.7–7.7)
Neutrophils Relative %: 79 %
Platelets: 242 10*3/uL (ref 150–400)
RBC: 3.23 MIL/uL — ABNORMAL LOW (ref 4.22–5.81)
RDW: 12.9 % (ref 11.5–15.5)
WBC: 18.5 10*3/uL — ABNORMAL HIGH (ref 4.0–10.5)

## 2016-03-06 LAB — BASIC METABOLIC PANEL
Anion gap: 7 (ref 5–15)
BUN: 15 mg/dL (ref 6–20)
CALCIUM: 8.1 mg/dL — AB (ref 8.9–10.3)
CHLORIDE: 103 mmol/L (ref 101–111)
CO2: 21 mmol/L — AB (ref 22–32)
CREATININE: 1.08 mg/dL (ref 0.61–1.24)
GFR calc Af Amer: >60 mL/min (ref >60)
GFR calc non Af Amer: >60 mL/min (ref >60)
GLUCOSE: 291 mg/dL — AB (ref 65–99)
Potassium: 4 mmol/L (ref 3.5–5.1)
Sodium: 131 mmol/L — ABNORMAL LOW (ref 135–145)

## 2016-03-06 LAB — URINE CULTURE: Culture: NO GROWTH

## 2016-03-06 LAB — GLUCOSE, CAPILLARY
GLUCOSE-CAPILLARY: 212 mg/dL — AB (ref 65–99)
Glucose-Capillary: 276 mg/dL — ABNORMAL HIGH (ref 65–99)
Glucose-Capillary: 243 mg/dL — ABNORMAL HIGH (ref 65–99)
Glucose-Capillary: 261 mg/dL — ABNORMAL HIGH (ref 65–99)

## 2016-03-06 MED ORDER — OXYCODONE-ACETAMINOPHEN 5-325 MG PO TABS
1.0000 | ORAL_TABLET | Freq: Four times a day (QID) | ORAL | Status: DC | PRN
Start: 2016-03-06 — End: 2016-03-15
  Administered 2016-03-06 – 2016-03-08 (×2): 2 via ORAL
  Administered 2016-03-10 (×2): 1 via ORAL
  Administered 2016-03-10: 2 via ORAL
  Administered 2016-03-11: 1 via ORAL
  Administered 2016-03-11: 2 via ORAL
  Administered 2016-03-12 (×2): 1 via ORAL
  Administered 2016-03-14 – 2016-03-15 (×4): 2 via ORAL
  Filled 2016-03-06: qty 2
  Filled 2016-03-06: qty 1
  Filled 2016-03-06: qty 2
  Filled 2016-03-06: qty 1
  Filled 2016-03-06: qty 2
  Filled 2016-03-06: qty 1
  Filled 2016-03-06 (×2): qty 2
  Filled 2016-03-06: qty 1
  Filled 2016-03-06 (×6): qty 2

## 2016-03-06 MED ORDER — OXYCODONE-ACETAMINOPHEN 5-325 MG PO TABS
2.0000 | ORAL_TABLET | Freq: Once | ORAL | Status: AC
  Administered 2016-03-06: 2 via ORAL
  Filled 2016-03-06: qty 2

## 2016-03-06 MED ORDER — ENOXAPARIN SODIUM 60 MG/0.6ML ~~LOC~~ SOLN
50.0000 mg | Freq: Every day | SUBCUTANEOUS | Status: DC
  Administered 2016-03-06 – 2016-03-14 (×9): 50 mg via SUBCUTANEOUS
  Filled 2016-03-06 (×9): qty 0.6

## 2016-03-06 MED ORDER — INSULIN GLARGINE 100 UNIT/ML ~~LOC~~ SOLN
25.0000 [IU] | Freq: Every day | SUBCUTANEOUS | Status: DC
  Administered 2016-03-06 – 2016-03-08 (×3): 25 [IU] via SUBCUTANEOUS
  Filled 2016-03-06 (×4): qty 0.25

## 2016-03-06 NOTE — Progress Notes (Signed)
PROGRESS NOTE    Danny Fuller  [id number] DOB: [birth year] DOA: 03/04/2016 PCP: [name], MD     Brief Narrative:  Danny Fuller is a 41 y.o. 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  He denies dysuria or hematuria. He was admitted for right-sided pyelonephritis and started on IV antibiotics.   Assessment & Plan:   Principal Problem:   Sepsis ([place]) Active Problems:   Essential hypertension   Diabetic polyneuropathy associated with type 2 diabetes mellitus ([facility])   Pyelonephritis  Sepsis secondary to acute pyelonephritis -Febrile, tachycardic, leukocytosis -Continue IV cefepime -Blood cultures pending -Urine culture negative  -IVF   Uncontrolled insulin-dependent diabetes mellitus type 2 -Continue Lantus, sliding scale insulin - increase lantus dose tonight  -Ha1c pending  -Continue Lyrica, tramadol for neuropathy  Essential hypertension -Continue amlodipine, clonidine, lisinopril, hydralazine, labetalol  -Well controlled today   DVT prophylaxis: lovenox Code Status: Full Family Communication: no family at bedside Disposition Plan: pending further improvement and stabilization    Consultants:   None  Procedures:   None  Antimicrobials:   Cefepime >>>    Subjective: Patient complaining of overall weakness. No other new complaints. Has some tenderness in his back, denies any nausea, vomiting or dysuria.  Objective: Vitals:   03/05/16 2051 03/06/16 0437 03/06/16 0531 03/06/16 1212  BP: 126/64 117/66    Pulse: 78 (!) 112 (!) 105   Resp: 18 18    Temp: 99 F (37.2 C) (!) 102.1 F (38.9 C) 99.6 F (37.6 C) (!) 100.7 F (38.2 C)  TempSrc: Oral Oral Oral Oral    SpO2: 98% 97%    Weight:      Height:        Intake/Output Summary (Last 24 hours) at 03/06/16 1224 Last data filed at 03/06/16 0950  Gross per 24 hour  Intake             3350 ml  Output             1200 ml  Net             2150 ml   Filed Weights   03/04/16 1549  Weight: 108 kg (238 lb)    Examination:  General exam: Appears calm and comfortable  Respiratory system: Clear to auscultation. Respiratory effort normal. Cardiovascular system: S1 & S2 heard, tachycardic, regular. No JVD, murmurs, rubs, gallops or clicks. No pedal edema. Gastrointestinal system: Abdomen is nondistended, soft and nontender. No organomegaly or masses felt. Normal bowel sounds heard. Central nervous system: Alert and oriented. No focal neurological deficits. Extremities: Symmetric 5 x 5 power. Skin: No rashes, lesions or ulcers Psychiatry: Judgement and insight appear normal. Mood & affect appropriate.   Data Reviewed: I have personally reviewed following labs and imaging studies  CBC:  Recent Labs Lab 03/03/16 2058 03/04/16 1630 03/05/16 0112 03/06/16 0534  WBC 14.0* 20.7* 20.1* 18.5*  NEUTROABS  --   --  14.9* 14.7*  HGB 12.3* 13.0 10.9* 9.6*  HCT 35.3* 36.9* 30.8* 27.4*  MCV 84.9 85.2 84.8 84.8  PLT 303 304 275 [id number]   Basic Metabolic Panel:  Recent Labs Lab 03/03/16 2058 03/04/16 1630 03/05/16 0112 03/06/16 0534  NA 130* 129* 134* 131*  K  4.0 4.2 4.3 4.0  CL 97* 96* 103 103  CO2 23 23 24  21*  GLUCOSE 302* 395* 332* 291*  BUN 18 16 13 15   CREATININE 1.22 1.21 0.94 1.08  CALCIUM 9.3 9.1 8.4* 8.1*   GFR: Estimated Creatinine Clearance: 112.3 mL/min (by C-G formula based on SCr of 1.08 mg/dL). Liver Function Tests:  Recent Labs Lab 03/03/16 2058 03/04/16 1630  AST 14* 18  ALT 13* 15*  ALKPHOS 82 91  BILITOT 0.5 1.2  PROT 7.7 8.1  ALBUMIN 3.8 4.2    Recent Labs Lab 03/04/16 1630  LIPASE 16   No results for input(s): AMMONIA in the last 168 hours. Coagulation  Profile: No results for input(s): INR, PROTIME in the last 168 hours. Cardiac Enzymes: No results for input(s): CKTOTAL, CKMB, CKMBINDEX, TROPONINI in the last 168 hours. BNP (last 3 results) No results for input(s): PROBNP in the last 8760 hours. HbA1C: No results for input(s): HGBA1C in the last 72 hours. CBG:  Recent Labs Lab 03/05/16 1157 03/05/16 1617 03/05/16 2044 03/06/16 0726 03/06/16 1203  GLUCAP 300* 273* 311* 276* 243*   Lipid Profile: No results for input(s): CHOL, HDL, LDLCALC, TRIG, CHOLHDL, LDLDIRECT in the last 72 hours. Thyroid Function Tests: No results for input(s): TSH, T4TOTAL, FREET4, T3FREE, THYROIDAB in the last 72 hours. Anemia Panel: No results for input(s): VITAMINB12, FOLATE, FERRITIN, TIBC, IRON, RETICCTPCT in the last 72 hours. Sepsis Labs:  Recent Labs Lab 03/04/16 2141 03/04/16 2334 03/05/16 0112  PROCALCITON  --   --  0.65  LATICACIDVEN 2.83* 1.15  --     Recent Results (from the past 240 hour(s))  Urine culture     Status: None   Collection Time: 03/04/16  9:50 PM  Result Value Ref Range Status   Specimen Description URINE, CLEAN CATCH  Final   Special Requests NONE  Final   Culture   Final    NO GROWTH Performed at [facility], [place] [address], [place], [place] [address]    Report Status 03/06/2016 FINAL  Final       Radiology Studies: Dg Chest 2 View  Result Date: 03/04/2016 CLINICAL DATA:  Fever, sepsis.  Hyperglycemia and dizziness. EXAM: CHEST  2 VIEW COMPARISON:  Radiographs 12/29/2015. FINDINGS: The cardiomediastinal contours are normal. Lung volumes are low. Pulmonary vasculature is normal. No consolidation, pleural effusion, or pneumothorax. No acute osseous abnormalities are seen. IMPRESSION: Low lung volumes without acute abnormality. Electronically Signed   By: Jeb Levering M.D.   On: 03/04/2016 22:30   Ct Abdomen Pelvis W Contrast  Result Date: 03/04/2016 CLINICAL DATA:  Right-sided abdominal and flank  pain. Fever and leukocytosis. EXAM: CT ABDOMEN AND PELVIS WITH CONTRAST TECHNIQUE: Multidetector CT imaging of the abdomen and pelvis was performed using the standard protocol following bolus administration of intravenous contrast. CONTRAST:  100 cc Isovue-300 IV COMPARISON:  None. FINDINGS: Lower chest: The lung bases are clear.  No pleural fluid. Hepatobiliary: Decreased hepatic density consistent with steatosis. No focal lesion. Gallbladder physiologically distended, no calcified stone. No biliary dilatation. Pancreas: No ductal dilatation or inflammation. Spleen: Normal in size without focal abnormality. Adrenals/Urinary Tract: Edematous appearance of the right kidney with mild perinephric edema. No intrarenal or perirenal fluid collection. Trace stranding about the left kidney. No hydronephrosis. Simple cyst in the lower left kidney. Urinary bladder is physiologically distended with mild wall thickening. Normal adrenal glands. Stomach/Bowel: Stomach is within normal limits. Appendix appears normal. No evidence of bowel wall thickening, distention,  or inflammatory changes. Colonic tortuosity. Diverticulosis of the descending colon without acute inflammation. Vascular/Lymphatic: Age advanced atheroscleroses of the abdominal aorta and its branches. Mildly prominent retroperitoneal and bilateral external iliac nodes are likely reactive. Reproductive: Prostate is unremarkable. Other: No free air, free fluid, or intra-abdominal fluid collection. Small fat containing umbilical hernia contains noninflamed small bowel. Musculoskeletal: There are no acute or suspicious osseous abnormalities. IMPRESSION: 1. Right pyelonephritis and probable cystitis. Minimal stranding about the left kidney may reflect mild left-sided pyelonephritis. 2. Hepatic steatosis. 3. Colonic diverticulosis without acute inflammation. 4. Age advanced atherosclerosis. Electronically Signed   By: Jeb Levering M.D.   On: 03/04/2016 22:55       Scheduled Meds: . amLODipine  10 mg Oral Daily  . ceFEPime (MAXIPIME) IV  1 g Intravenous Q8H  . cloNIDine  0.1 mg Oral BID  . enoxaparin (LOVENOX) injection  50 mg Subcutaneous QHS  . hydrALAZINE  25 mg Oral Q8H  . insulin aspart  0-20 Units Subcutaneous TID WC  . insulin aspart  0-5 Units Subcutaneous QHS  . insulin glargine  20 Units Subcutaneous Q2200  . labetalol  200 mg Oral BID  . lisinopril  20 mg Oral Daily  . pregabalin  75 mg Oral BID  . sodium chloride flush  3 mL Intravenous Q12H   Continuous Infusions: . sodium chloride 125 mL/hr at 03/06/16 0948     LOS: 2 days    Time spent: 30 minutes   Dessa Phi, DO [facility] [email or web address] Password [email or web address] 03/06/2016, 12:24 PM

## 2016-03-07 ENCOUNTER — Encounter (HOSPITAL_COMMUNITY)
Admission: EM | Disposition: A | Discharge status: Home Health | Payer: Self-pay | Source: Home / Self Care | Attending: Internal Medicine

## 2016-03-07 ENCOUNTER — Inpatient Hospital Stay (HOSPITAL_COMMUNITY): Payer: BC Managed Care – PPO | Admitting: Certified Registered Nurse Anesthetist

## 2016-03-07 ENCOUNTER — Inpatient Hospital Stay (HOSPITAL_COMMUNITY): Payer: BC Managed Care – PPO

## 2016-03-07 ENCOUNTER — Encounter (HOSPITAL_COMMUNITY): Payer: Self-pay | Admitting: Certified Registered Nurse Anesthetist

## 2016-03-07 ENCOUNTER — Encounter: Payer: Self-pay | Admitting: Internal Medicine

## 2016-03-07 DIAGNOSIS — M726 Necrotizing fasciitis: Secondary | ICD-10-CM | POA: Clinically undetermined

## 2016-03-07 HISTORY — PX: INCISION AND DRAINAGE PERIRECTAL ABSCESS: SHX1804

## 2016-03-07 LAB — BASIC METABOLIC PANEL
ANION GAP: 10 (ref 5–15)
Anion gap: 8 (ref 5–15)
BUN: 16 mg/dL (ref 6–20)
BUN: 15 mg/dL (ref 6–20)
CALCIUM: 7.9 mg/dL — AB (ref 8.9–10.3)
CHLORIDE: 102 mmol/L (ref 101–111)
CO2: 17 mmol/L — AB (ref 22–32)
CO2: 19 mmol/L — ABNORMAL LOW (ref 22–32)
CREATININE: 1.23 mg/dL (ref 0.61–1.24)
Calcium: 8.1 mg/dL — ABNORMAL LOW (ref 8.9–10.3)
Chloride: 106 mmol/L (ref 101–111)
Creatinine, Ser: 1.22 mg/dL (ref 0.61–1.24)
GFR calc Af Amer: >60 mL/min (ref >60)
GFR calc non Af Amer: >60 mL/min (ref >60)
GLUCOSE: 180 mg/dL — AB (ref 65–99)
Glucose, Bld: 315 mg/dL — ABNORMAL HIGH (ref 65–99)
Potassium: 4.7 mmol/L (ref 3.5–5.1)
Potassium: 4.4 mmol/L (ref 3.5–5.1)
Sodium: 129 mmol/L — ABNORMAL LOW (ref 135–145)
Sodium: 133 mmol/L — ABNORMAL LOW (ref 135–145)

## 2016-03-07 LAB — CBC WITH DIFFERENTIAL/PLATELET
BASOS ABS: 0 10*3/uL (ref 0.0–0.1)
BASOS PCT: 0 %
EOS ABS: 0 10*3/uL (ref 0.0–0.7)
Eosinophils Relative: 0 %
HEMATOCRIT: 26.5 % — AB (ref 39.0–52.0)
HEMOGLOBIN: 9.3 g/dL — AB (ref 13.0–17.0)
LYMPHS PCT: 8 %
Lymphs Abs: 1.9 10*3/uL (ref 0.7–4.0)
MCH: 30.1 pg (ref 26.0–34.0)
MCHC: 35.1 g/dL (ref 30.0–36.0)
MCV: 85.8 fL (ref 78.0–100.0)
Monocytes Absolute: 1.9 10*3/uL — ABNORMAL HIGH (ref 0.1–1.0)
Monocytes Relative: 8 %
Neutro Abs: 19.5 10*3/uL — ABNORMAL HIGH (ref 1.7–7.7)
Neutrophils Relative %: 84 %
Platelets: 278 10*3/uL (ref 150–400)
RBC: 3.09 MIL/uL — ABNORMAL LOW (ref 4.22–5.81)
RDW: 13.2 % (ref 11.5–15.5)
WBC: 23.3 10*3/uL — ABNORMAL HIGH (ref 4.0–10.5)

## 2016-03-07 LAB — CBC
HCT: 25.2 % — ABNORMAL LOW (ref 39.0–52.0)
Hemoglobin: 8.8 g/dL — ABNORMAL LOW (ref 13.0–17.0)
MCH: 29.9 pg (ref 26.0–34.0)
MCHC: 34.9 g/dL (ref 30.0–36.0)
MCV: 85.7 fL (ref 78.0–100.0)
PLATELETS: 272 10*3/uL (ref 150–400)
RBC: 2.94 MIL/uL — ABNORMAL LOW (ref 4.22–5.81)
RDW: 13.1 % (ref 11.5–15.5)
WBC: 23.8 10*3/uL — AB (ref 4.0–10.5)

## 2016-03-07 LAB — GLUCOSE, CAPILLARY
GLUCOSE-CAPILLARY: 194 mg/dL — AB (ref 65–99)
Glucose-Capillary: 198 mg/dL — ABNORMAL HIGH (ref 65–99)
Glucose-Capillary: 209 mg/dL — ABNORMAL HIGH (ref 65–99)
Glucose-Capillary: 298 mg/dL — ABNORMAL HIGH (ref 65–99)
Glucose-Capillary: 324 mg/dL — ABNORMAL HIGH (ref 65–99)

## 2016-03-07 LAB — HEMOGLOBIN A1C
HEMOGLOBIN A1C: 11.6 % — AB (ref 4.8–5.6)
MEAN PLASMA GLUCOSE: 286 mg/dL

## 2016-03-07 LAB — MRSA PCR SCREENING: MRSA BY PCR: NEGATIVE

## 2016-03-07 SURGERY — INCISION AND DRAINAGE, ABSCESS, PERIRECTAL
Anesthesia: General | Laterality: Right

## 2016-03-07 MED ORDER — IOPAMIDOL (ISOVUE-300) INJECTION 61%
INTRAVENOUS | Status: AC
  Filled 2016-03-07: qty 30

## 2016-03-07 MED ORDER — MEPERIDINE HCL 50 MG/ML IJ SOLN: 6.2500 mg | INTRAMUSCULAR | Status: DC | PRN

## 2016-03-07 MED ORDER — PROPOFOL 10 MG/ML IV BOLUS
INTRAVENOUS | Status: AC
  Filled 2016-03-07: qty 20

## 2016-03-07 MED ORDER — MIDAZOLAM HCL 2 MG/2ML IJ SOLN: 0.5000 mg | Freq: Once | INTRAMUSCULAR | Status: DC | PRN

## 2016-03-07 MED ORDER — SUCCINYLCHOLINE CHLORIDE 200 MG/10ML IV SOSY
PREFILLED_SYRINGE | INTRAVENOUS | Status: DC | PRN
  Administered 2016-03-07: 160 mg via INTRAVENOUS

## 2016-03-07 MED ORDER — IOPAMIDOL (ISOVUE-300) INJECTION 61%
30.0000 mL | Freq: Once | INTRAVENOUS | Status: AC
  Administered 2016-03-07: 30 mL via ORAL

## 2016-03-07 MED ORDER — HYDRALAZINE HCL 20 MG/ML IJ SOLN: 2.0000 mg | INTRAMUSCULAR | Status: DC | PRN

## 2016-03-07 MED ORDER — 0.9 % SODIUM CHLORIDE (POUR BTL) OPTIME
TOPICAL | Status: DC | PRN
  Administered 2016-03-07: 1000 mL

## 2016-03-07 MED ORDER — LABETALOL HCL 5 MG/ML IV SOLN
5.0000 mg | INTRAVENOUS | Status: DC | PRN
  Administered 2016-03-07 (×2): 5 mg via INTRAVENOUS
  Filled 2016-03-07 (×3): qty 4

## 2016-03-07 MED ORDER — ALBUMIN HUMAN 5 % IV SOLN
12.5000 g | Freq: Once | INTRAVENOUS | Status: AC
  Administered 2016-03-07: 12.5 g via INTRAVENOUS

## 2016-03-07 MED ORDER — PROPOFOL 10 MG/ML IV BOLUS
INTRAVENOUS | Status: DC | PRN
  Administered 2016-03-07: 180 mg via INTRAVENOUS

## 2016-03-07 MED ORDER — MIDAZOLAM HCL 5 MG/5ML IJ SOLN
INTRAMUSCULAR | Status: DC | PRN
  Administered 2016-03-07: 2 mg via INTRAVENOUS

## 2016-03-07 MED ORDER — VANCOMYCIN HCL 10 G IV SOLR
2000.0000 mg | Freq: Once | INTRAVENOUS | Status: AC
  Administered 2016-03-07: 2000 mg via INTRAVENOUS
  Filled 2016-03-07: qty 2000

## 2016-03-07 MED ORDER — HYDROMORPHONE HCL 1 MG/ML IJ SOLN
0.2500 mg | INTRAMUSCULAR | Status: DC | PRN
  Administered 2016-03-07 (×2): 0.5 mg via INTRAVENOUS

## 2016-03-07 MED ORDER — ACETAMINOPHEN 10 MG/ML IV SOLN
INTRAVENOUS | Status: AC
Start: 2016-03-07 — End: 2016-03-08
  Filled 2016-03-07: qty 100

## 2016-03-07 MED ORDER — BUPIVACAINE HCL (PF) 0.5 % IJ SOLN
INTRAMUSCULAR | Status: AC
  Filled 2016-03-07: qty 30

## 2016-03-07 MED ORDER — LIDOCAINE 2% (20 MG/ML) 5 ML SYRINGE
INTRAMUSCULAR | Status: AC
  Filled 2016-03-07: qty 5

## 2016-03-07 MED ORDER — FENTANYL CITRATE (PF) 100 MCG/2ML IJ SOLN
INTRAMUSCULAR | Status: AC
  Filled 2016-03-07: qty 2

## 2016-03-07 MED ORDER — MIDAZOLAM HCL 2 MG/2ML IJ SOLN
INTRAMUSCULAR | Status: AC
  Filled 2016-03-07: qty 2

## 2016-03-07 MED ORDER — LACTATED RINGERS IV BOLUS (SEPSIS)
500.0000 mL | Freq: Once | INTRAVENOUS | Status: AC
Start: 2016-03-07 — End: 2016-03-07
  Administered 2016-03-07: 500 mL via INTRAVENOUS

## 2016-03-07 MED ORDER — SODIUM CHLORIDE 0.9 % IV BOLUS (SEPSIS)
500.0000 mL | Freq: Once | INTRAVENOUS | Status: AC
  Administered 2016-03-07: 500 mL via INTRAVENOUS

## 2016-03-07 MED ORDER — HYDROGEN PEROXIDE 3 % EX SOLN
CUTANEOUS | Status: AC
  Filled 2016-03-07: qty 473

## 2016-03-07 MED ORDER — IOPAMIDOL (ISOVUE-300) INJECTION 61%
INTRAVENOUS | Status: AC
  Administered 2016-03-07: 100 mL
  Filled 2016-03-07: qty 100

## 2016-03-07 MED ORDER — HYDROMORPHONE HCL 1 MG/ML IJ SOLN
INTRAMUSCULAR | Status: AC
Start: 2016-03-07 — End: 2016-03-07
  Administered 2016-03-07: 0.5 mg via INTRAVENOUS
  Filled 2016-03-07: qty 1

## 2016-03-07 MED ORDER — LACTATED RINGERS IV SOLN
INTRAVENOUS | Status: DC | PRN
  Administered 2016-03-07: 18:00:00 via INTRAVENOUS

## 2016-03-07 MED ORDER — ONDANSETRON HCL 4 MG/2ML IJ SOLN
INTRAMUSCULAR | Status: DC | PRN
  Administered 2016-03-07: 4 mg via INTRAVENOUS

## 2016-03-07 MED ORDER — PROMETHAZINE HCL 25 MG/ML IJ SOLN: 6.2500 mg | INTRAMUSCULAR | Status: DC | PRN

## 2016-03-07 MED ORDER — SUCCINYLCHOLINE CHLORIDE 200 MG/10ML IV SOSY
PREFILLED_SYRINGE | INTRAVENOUS | Status: AC
  Filled 2016-03-07: qty 10

## 2016-03-07 MED ORDER — MEROPENEM 1 G IV SOLR
1.0000 g | Freq: Three times a day (TID) | INTRAVENOUS | Status: DC
  Administered 2016-03-07 – 2016-03-14 (×21): 1 g via INTRAVENOUS
  Filled 2016-03-07 (×25): qty 1

## 2016-03-07 MED ORDER — ASPIRIN 81 MG PO CHEW
324.0000 mg | CHEWABLE_TABLET | Freq: Once | ORAL | Status: AC
  Administered 2016-03-07: 324 mg via ORAL
  Filled 2016-03-07: qty 4

## 2016-03-07 MED ORDER — VANCOMYCIN HCL IN DEXTROSE 750-5 MG/150ML-% IV SOLN
750.0000 mg | Freq: Two times a day (BID) | INTRAVENOUS | Status: DC
  Administered 2016-03-08 – 2016-03-10 (×5): 750 mg via INTRAVENOUS
  Filled 2016-03-07 (×6): qty 150

## 2016-03-07 MED ORDER — ACETAMINOPHEN 160 MG/5ML PO SOLN
650.0000 mg | Freq: Four times a day (QID) | ORAL | Status: DC | PRN
  Administered 2016-03-07 – 2016-03-10 (×4): 650 mg via ORAL
  Filled 2016-03-07 (×4): qty 20.3

## 2016-03-07 MED ORDER — CLINDAMYCIN PHOSPHATE 900 MG/50ML IV SOLN
INTRAVENOUS | Status: AC
  Filled 2016-03-07: qty 50

## 2016-03-07 MED ORDER — ONDANSETRON HCL 4 MG/2ML IJ SOLN
INTRAMUSCULAR | Status: AC
  Filled 2016-03-07: qty 2

## 2016-03-07 MED ORDER — CLINDAMYCIN PHOSPHATE 900 MG/50ML IV SOLN
900.0000 mg | Freq: Three times a day (TID) | INTRAVENOUS | Status: DC
  Administered 2016-03-07 – 2016-03-14 (×20): 900 mg via INTRAVENOUS
  Filled 2016-03-07 (×22): qty 50

## 2016-03-07 MED ORDER — HYDROGEN PEROXIDE 3 % EX SOLN
CUTANEOUS | Status: DC | PRN
Start: 2016-03-07 — End: 2016-03-07
  Administered 2016-03-07: 1

## 2016-03-07 MED ORDER — LIDOCAINE 2% (20 MG/ML) 5 ML SYRINGE
INTRAMUSCULAR | Status: DC | PRN
  Administered 2016-03-07: 100 mg via INTRAVENOUS

## 2016-03-07 MED ORDER — FENTANYL CITRATE (PF) 100 MCG/2ML IJ SOLN
INTRAMUSCULAR | Status: DC | PRN
  Administered 2016-03-07 (×2): 50 ug via INTRAVENOUS

## 2016-03-07 MED ORDER — LACTATED RINGERS IV SOLN: INTRAVENOUS | Status: DC

## 2016-03-07 MED ORDER — ACETAMINOPHEN 10 MG/ML IV SOLN
1000.0000 mg | Freq: Once | INTRAVENOUS | Status: AC
  Administered 2016-03-07: 1000 mg via INTRAVENOUS

## 2016-03-07 MED ORDER — ALBUMIN HUMAN 5 % IV SOLN
INTRAVENOUS | Status: AC
  Filled 2016-03-07: qty 250

## 2016-03-07 SURGICAL SUPPLY — 26 items
BLADE SURG 15 STRL LF DISP TIS (BLADE) ×1 IMPLANT
BLADE SURG 15 STRL SS (BLADE) ×3
BNDG GAUZE ELAST 4 BULKY (GAUZE/BANDAGES/DRESSINGS) ×5 IMPLANT
BRIEF STRETCH FOR OB PAD LRG (UNDERPADS AND DIAPERS) IMPLANT
COVER SURGICAL LIGHT HANDLE (MISCELLANEOUS) ×3 IMPLANT
DRAPE SHEET LG 3/4 BI-LAMINATE (DRAPES) ×2 IMPLANT
DRSG PAD ABDOMINAL 8X10 ST (GAUZE/BANDAGES/DRESSINGS) IMPLANT
ELECT PENCIL ROCKER SW 15FT (MISCELLANEOUS) ×3 IMPLANT
ELECT REM PT RETURN 9FT ADLT (ELECTROSURGICAL) ×3
ELECTRODE REM PT RTRN 9FT ADLT (ELECTROSURGICAL) ×1 IMPLANT
GAUZE SPONGE 4X4 12PLY STRL (GAUZE/BANDAGES/DRESSINGS) ×3 IMPLANT
GLOVE SURG SIGNA 7.5 PF LTX (GLOVE) ×3 IMPLANT
GOWN STRL REUS W/TWL XL LVL3 (GOWN DISPOSABLE) ×6 IMPLANT
KIT BASIN OR (CUSTOM PROCEDURE TRAY) ×3 IMPLANT
NEEDLE HYPO 22GX1.5 SAFETY (NEEDLE) IMPLANT
PACK LITHOTOMY IV (CUSTOM PROCEDURE TRAY) ×3 IMPLANT
PAD ABD 8X10 STRL (GAUZE/BANDAGES/DRESSINGS) ×2 IMPLANT
SOL PREP POV-IOD 16OZ 10% (MISCELLANEOUS) IMPLANT
SPONGE LAP 18X18 X RAY DECT (DISPOSABLE) ×5 IMPLANT
SWAB COLLECTION DEVICE MRSA (MISCELLANEOUS) ×2 IMPLANT
SWAB CULTURE ESWAB REG 1ML (MISCELLANEOUS) ×2 IMPLANT
SYR CONTROL 10ML LL (SYRINGE) IMPLANT
TOWEL OR 17X26 10 PK STRL BLUE (TOWEL DISPOSABLE) ×3 IMPLANT
TOWEL OR NON WOVEN STRL DISP B (DISPOSABLE) ×3 IMPLANT
UNDERPAD 30X30 INCONTINENT (UNDERPADS AND DIAPERS) ×3 IMPLANT
YANKAUER SUCT BULB TIP 10FT TU (MISCELLANEOUS) ×3 IMPLANT

## 2016-03-07 NOTE — Consult Note (Signed)
[facility] Surgery Consult Note  Danny Fuller Mar 22, 1975  [id number].    Requesting MD: Maylene Roes Chief Complaint/Reason for Consult: Necrotizing fasciitis  HPI:  Danny Fuller is a 41yo male admitted to [facility] 03/03/16 with sepsis suspected to be 2/2 pyelonephritis. States that 4 days prior to admission he began having lower back pain and his blood sugars were uncontrollable. He was also experiencing intermittent fever and chills. On arrival in the ED he was found to have a fever of 102.8 associated with tachycardia (HR 130's), WBC 20.7, and initial lactic acid level of 2.83. Initial CT scan showed bilateral pyelonephritis and probable cystitis. Patient was admitted and started on IV cefepime. Blood cultures and urine cultures were negative. Patient failed to improve as he remaiend febrile, hypertensive, and tachycardic. Patient is now also complaining of significant gastric reflux. 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 General surgery asked to consult. He has had nothing to eat today, but he has had water and [place] within the last hour. Last BM 4 days ago  PMH significant for uncontrolled IDDM, HTN, GERD, asthma Denies prior history of abdominal surgery Nonsmoker Employment: previously worked in Biomedical scientist, currently applying for disability  ROS: Review of Systems  Constitutional: Positive for chills, fever and malaise/fatigue.  Respiratory: Negative.   Cardiovascular: Negative.   Gastrointestinal: Positive for heartburn and nausea. Negative for abdominal pain, constipation, diarrhea and vomiting.  Genitourinary: Positive for flank pain. Negative for dysuria, frequency and urgency.  Musculoskeletal: Positive for back pain and myalgias.  Neurological: Positive for weakness.   All systems reviewed and otherwise negative except for as above  Family History  Problem Relation Age of Onset  . Arthritis    . Diabetes    . Hypertension    .  Hyperlipidemia    . Stroke    . Sudden death    . Diabetes Mother   . Diabetes Sister   . Diabetes Brother     Past Medical History:  Diagnosis Date  . Asthma   . Diabetes mellitus    sees Dr. Dwyane Dee   . GERD (gastroesophageal reflux disease)   . Headache(784.0)   . Hypertension     Past Surgical History:  Procedure Laterality Date  . AMPUTATION Left 07/26/2015   Procedure: AMPUTATION LEFT FIFTH RAY;  Surgeon: [name], MD;  Location: [facility];  Service: Orthopedics;  Laterality: Left;  . bilateral hip pins placed      Social History:  reports that he has quit smoking. His smoking use included Cigarettes. He has never used smokeless tobacco. He reports that he does not drink alcohol or use drugs.  Allergies: No Known Allergies  Medications Prior to Admission  Medication Sig Dispense Refill  . amLODipine (NORVASC) 10 MG tablet Take 1 tablet (10 mg total) by mouth daily. 30 tablet 1  . cloNIDine (CATAPRES) 0.1 MG tablet Take 0.1 mg by mouth 2 (two) times daily.  0  . glucose blood (ONETOUCH VERIO) test strip Use to check blood sugar 1 time per day. 100 each 2  . hydrALAZINE (APRESOLINE) 25 MG tablet Take 1 tablet (25 mg total) by mouth every 8 (eight) hours. 90 tablet 0  . insulin aspart (NOVOLOG FLEXPEN) 100 UNIT/ML FlexPen Before each meal 3 times a day, 140-199 - 2 units, 200-250 - 4 units, 251-299 - 6 units,  300-349 - 8 units,  350 or above 10 units. Insulin PEN if approved, provide syringes and needles if needed. (Patient taking differently:  Inject 2-10 Units into the skin 3 (three) times daily with meals. Before each meal 3 times a day, 140-199 - 2 units, 200-250 - 4 units, 251-299 - 6 units,  300-349 - 8 units,  350 or above 10 units. Insulin PEN if approved, provide syringes and needles if needed.) 15 mL 3  . Insulin Glargine (LANTUS) 100 UNIT/ML Solostar Pen Inject 20 Units into the skin daily at 10 pm. 15 mL 3  . labetalol (NORMODYNE) 200 MG tablet Take 1 tablet (200  mg total) by mouth 2 (two) times daily. 60 tablet 0  . lisinopril (PRINIVIL,ZESTRIL) 20 MG tablet Take 1 tablet (20 mg total) by mouth daily. 90 tablet 3  . pregabalin (LYRICA) 75 MG capsule Take 1 capsule (75 mg total) by mouth 2 (two) times daily. 60 capsule 5  . traMADol (ULTRAM) 50 MG tablet Take 2 tablets (100 mg total) by mouth every 6 (six) hours as needed for moderate pain. 120 tablet 2    Prior to Admission medications   Medication Sig Start Date End Date Taking? Authorizing Provider  amLODipine (NORVASC) 10 MG tablet Take 1 tablet (10 mg total) by mouth daily. 11/10/15  Yes Orson Eva, MD  cloNIDine (CATAPRES) 0.1 MG tablet Take 0.1 mg by mouth 2 (two) times daily. 12/22/15  Yes Historical Provider, MD  glucose blood (ONETOUCH VERIO) test strip Use to check blood sugar 1 time per day. 09/16/15  Yes [name], MD  hydrALAZINE (APRESOLINE) 25 MG tablet Take 1 tablet (25 mg total) by mouth every 8 (eight) hours. 12/22/15  Yes [name], MD  insulin aspart (NOVOLOG FLEXPEN) 100 UNIT/ML FlexPen Before each meal 3 times a day, 140-199 - 2 units, 200-250 - 4 units, 251-299 - 6 units,  300-349 - 8 units,  350 or above 10 units. Insulin PEN if approved, provide syringes and needles if needed. Patient taking differently: Inject 2-10 Units into the skin 3 (three) times daily with meals. Before each meal 3 times a day, 140-199 - 2 units, 200-250 - 4 units, 251-299 - 6 units,  300-349 - 8 units,  350 or above 10 units. Insulin PEN if approved, provide syringes and needles if needed. 07/29/15  Yes [name], MD  Insulin Glargine (LANTUS) 100 UNIT/ML Solostar Pen Inject 20 Units into the skin daily at 10 pm. 07/29/15  Yes [name], MD  labetalol (NORMODYNE) 200 MG tablet Take 1 tablet (200 mg total) by mouth 2 (two) times daily. 11/10/15  Yes Orson Eva, MD  lisinopril (PRINIVIL,ZESTRIL) 20 MG tablet Take 1 tablet (20 mg total) by mouth daily. 01/19/16  Yes [name], MD  pregabalin (LYRICA) 75 MG capsule  Take 1 capsule (75 mg total) by mouth 2 (two) times daily. 01/31/16  Yes Michel Bickers, MD  traMADol (ULTRAM) 50 MG tablet Take 2 tablets (100 mg total) by mouth every 6 (six) hours as needed for moderate pain. 01/19/16  Yes [name], MD    Blood pressure (!) 182/79, pulse (!) 135, temperature (!) 100.8 F (38.2 C), temperature source Oral, resp. rate 17, height _0  (1.753 m), weight 238 lb (108 kg), SpO2 100 %.  Physical Exam: General: pleasant, WD/WN AA male who is laying uncomfortably in bed HEENT: head is normocephalic, atraumatic.  Sclera are noninjected.  PERRL.  Mouth is pink and moist Heart: regular rhythm, tachycardic.  No obvious murmurs, gallops, or rubs noted.  Palpable pedal pulses bilaterally Lungs: CTAB, no wheezes, rhonchi, or rales noted.  Respiratory effort nonlabored Abd: soft, NT/ND, +BS, no masses, hernias, or organomegaly MS: all 4 extremities are symmetrical with no cyanosis, clubbing, or edema. Skin: warm and dry with no masses, lesions, or rashes Psych: A&Ox3 with an appropriate affect. Neuro: CM 2-12 intact, extremity CSM intact bilaterally, normal speech GU: significant induration/edema of the right glut region with a fluctuant area noted on right side posterior to scrotum, no active drainage  Results for orders placed or performed during the hospital encounter of 03/04/16 (from the past 48 hour(s))  Glucose, capillary     Status: Abnormal   Collection Time: 03/05/16  8:44 PM  Result Value Ref Range   Glucose-Capillary 311 (H) 65 - 99 mg/dL  CBC with Differential/Platelet     Status: Abnormal   Collection Time: 03/06/16  5:34 AM  Result Value Ref Range   WBC 18.5 (H) 4.0 - 10.5 K/uL   RBC 3.23 (L) 4.22 - 5.81 MIL/uL   Hemoglobin 9.6 (L) 13.0 - 17.0 g/dL   HCT 27.4 (L) 39.0 - 52.0 %   MCV 84.8 78.0 - 100.0 fL   MCH 29.7 26.0 - 34.0 pg   MCHC 35.0 30.0 - 36.0 g/dL   RDW 12.9 11.5 - 15.5 %   Platelets 242 150 - 400 K/uL   Neutrophils Relative % 79 %    Neutro Abs 14.7 (H) 1.7 - 7.7 K/uL   Lymphocytes Relative 11 %   Lymphs Abs 2.0 0.7 - 4.0 K/uL   Monocytes Relative 10 %   Monocytes Absolute 1.9 (H) 0.1 - 1.0 K/uL   Eosinophils Relative 0 %   Eosinophils Absolute 0.0 0.0 - 0.7 K/uL   Basophils Relative 0 %   Basophils Absolute 0.0 0.0 - 0.1 K/uL  Basic metabolic panel     Status: Abnormal   Collection Time: 03/06/16  5:34 AM  Result Value Ref Range   Sodium 131 (L) 135 - 145 mmol/L   Potassium 4.0 3.5 - 5.1 mmol/L   Chloride 103 101 - 111 mmol/L   CO2 21 (L) 22 - 32 mmol/L   Glucose, Bld 291 (H) 65 - 99 mg/dL   BUN 15 6 - 20 mg/dL   Creatinine, Ser 1.08 0.61 - 1.24 mg/dL   Calcium 8.1 (L) 8.9 - 10.3 mg/dL   GFR calc non Af Amer >60 >60 mL/min   GFR calc Af Amer >60 >60 mL/min    Comment: (NOTE) The eGFR has been calculated using the CKD EPI equation. This calculation has not been validated in all clinical situations. eGFR's persistently <60 mL/min signify possible Chronic Kidney Disease.    Anion gap 7 5 - 15  Hemoglobin A1c     Status: Abnormal   Collection Time: 03/06/16  5:34 AM  Result Value Ref Range   Hgb A1c MFr Bld 11.6 (H) 4.8 - 5.6 %    Comment: (NOTE)         Pre-diabetes: 5.7 - 6.4         Diabetes: >6.4         Glycemic control for adults with diabetes: <7.0    Mean Plasma Glucose 286 mg/dL    Comment: (NOTE) Performed At: [facility] [address] [place], [place] [id number] Lindon Romp MD [id number]   Glucose, capillary     Status: Abnormal   Collection Time: 03/06/16  7:26 AM  Result Value Ref Range   Glucose-Capillary 276 (H) 65 - 99 mg/dL  Glucose, capillary     Status: Abnormal  Collection Time: 03/06/16 12:03 PM  Result Value Ref Range   Glucose-Capillary 243 (H) 65 - 99 mg/dL  Glucose, capillary     Status: Abnormal   Collection Time: 03/06/16  4:23 PM  Result Value Ref Range   Glucose-Capillary 212 (H) 65 - 99 mg/dL  Glucose, capillary     Status: Abnormal    Collection Time: 03/06/16  9:15 PM  Result Value Ref Range   Glucose-Capillary 261 (H) 65 - 99 mg/dL  CBC with Differential/Platelet     Status: Abnormal   Collection Time: 03/07/16  5:49 AM  Result Value Ref Range   WBC 23.3 (H) 4.0 - 10.5 K/uL   RBC 3.09 (L) 4.22 - 5.81 MIL/uL   Hemoglobin 9.3 (L) 13.0 - 17.0 g/dL   HCT 26.5 (L) 39.0 - 52.0 %   MCV 85.8 78.0 - 100.0 fL   MCH 30.1 26.0 - 34.0 pg   MCHC 35.1 30.0 - 36.0 g/dL   RDW 13.2 11.5 - 15.5 %   Platelets 278 150 - 400 K/uL   Neutrophils Relative % 84 %   Lymphocytes Relative 8 %   Monocytes Relative 8 %   Eosinophils Relative 0 %   Basophils Relative 0 %   Neutro Abs 19.5 (H) 1.7 - 7.7 K/uL   Lymphs Abs 1.9 0.7 - 4.0 K/uL   Monocytes Absolute 1.9 (H) 0.1 - 1.0 K/uL   Eosinophils Absolute 0.0 0.0 - 0.7 K/uL   Basophils Absolute 0.0 0.0 - 0.1 K/uL  Basic metabolic panel     Status: Abnormal   Collection Time: 03/07/16  5:49 AM  Result Value Ref Range   Sodium 129 (L) 135 - 145 mmol/L   Potassium 4.7 3.5 - 5.1 mmol/L   Chloride 102 101 - 111 mmol/L   CO2 17 (L) 22 - 32 mmol/L   Glucose, Bld 315 (H) 65 - 99 mg/dL   BUN 16 6 - 20 mg/dL   Creatinine, Ser 1.22 0.61 - 1.24 mg/dL   Calcium 8.1 (L) 8.9 - 10.3 mg/dL   GFR calc non Af Amer >60 >60 mL/min   GFR calc Af Amer >60 >60 mL/min    Comment: (NOTE) The eGFR has been calculated using the CKD EPI equation. This calculation has not been validated in all clinical situations. eGFR's persistently <60 mL/min signify possible Chronic Kidney Disease.    Anion gap 10 5 - 15  Glucose, capillary     Status: Abnormal   Collection Time: 03/07/16  7:57 AM  Result Value Ref Range   Glucose-Capillary 324 (H) 65 - 99 mg/dL  Glucose, capillary     Status: Abnormal   Collection Time: 03/07/16 12:10 PM  Result Value Ref Range   Glucose-Capillary 298 (H) 65 - 99 mg/dL   Ct Abdomen Pelvis W Contrast  Result Date: 03/07/2016 CLINICAL DATA:  Elevated white blood cell count, new pain  in the right gluteal region, evaluate for abscess EXAM: CT ABDOMEN AND PELVIS WITH CONTRAST TECHNIQUE: Multidetector CT imaging of the abdomen and pelvis was performed using the standard protocol following bolus administration of intravenous contrast. CONTRAST:  146m ISOVUE-300 IOPAMIDOL (ISOVUE-300) INJECTION 61%, 358mISOVUE-300 IOPAMIDOL (ISOVUE-300) INJECTION 61% COMPARISON:  CT abdomen pelvis 03/04/2016 FINDINGS: Lower chest: The lung bases are clear. Hepatobiliary: The liver is somewhat low in attenuation which may indicate fatty infiltration. A single small fully defined low-attenuation structures present in the right lobe on image 24 probably representing hemangioma. No calcified gallstones are seen. Pancreas: The pancreas  is normal in size and the pancreatic duct is not dilated. Spleen: The spleen is unremarkable. Adrenals/Urinary Tract: The adrenal glands are stable in contour. The kidneys enhance with no change in the small cysts in the lower pole of the left kidney. No hydronephrosis is noted. The ureters are normal in caliber. There is more perinephric strandiness particularly over the lower pole of the right kidney which is nonspecific but could indicate mild inflammation. The urinary bladder is not well distended but no abnormality is seen. Stomach/Bowel: The stomach is not well distended but no abnormality is seen. No small bowel distention is noted. The colon is largely decompressed with a moderate amount of feces present. The terminal ileum and the appendix are unremarkable. Vascular/Lymphatic: The abdominal aorta is normal in caliber with moderate abdominal aortic atherosclerosis present. No adenopathy is seen. Borderline right groin nodes are unchanged compared to the prior study. Reproductive: The prostate is small. Other: There is inflammation and soft tissue gas present within the right medial buttocks primarily in the fat planes extending into the perineum near the base of the scrotum. These  findings are consistent with necrotizing soft tissue infection, often termed Fourniere's gangrene. Musculoskeletal: The lumbar vertebrae are in normal alignment. No compression deformity is seen. Intervertebral disc spaces appear normal. IMPRESSION: 1. 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 Critical Value/emergent results were called by telephone at the time of interpretation on 03/07/2016 at 3:54 pm to Dr. Dessa Phi , who verbally acknowledged these results. 2. Suspect fatty infiltration of the liver. 3. Slightly more perinephric strandiness than on the prior exam may indicate infection but no obstruction is evident. 4. Moderate abdominal aortic atherosclerosis for age. Electronically Signed   By: Ivar Drape M.D.   On: 03/07/2016 15:56    Anti-infectives    Start     Dose/Rate Route Frequency Ordered Stop   03/08/16 0200  vancomycin (VANCOCIN) IVPB 750 mg/150 ml premix     750 mg 150 mL/hr over 60 Minutes Intravenous Every 12 hours 03/07/16 1212     03/07/16 1400  meropenem (MERREM) 1 g in sodium chloride 0.9 % 100 mL IVPB     1 g 200 mL/hr over 30 Minutes Intravenous Every 8 hours 03/07/16 1212     03/07/16 1300  vancomycin (VANCOCIN) 2,000 mg in sodium chloride 0.9 % 500 mL IVPB     2,000 mg 250 mL/hr over 120 Minutes Intravenous  Once 03/07/16 1212 03/07/16 1456   03/05/16 1100  ceFEPIme (MAXIPIME) 2 g in dextrose 5 % 50 mL IVPB  Status:  Discontinued     2 g 100 mL/hr over 30 Minutes Intravenous Every 12 hours 03/05/16 0829 03/05/16 1003   03/05/16 1030  ceFEPIme (MAXIPIME) 1 g in dextrose 5 % 50 mL IVPB  Status:  Discontinued     1 g 100 mL/hr over 30 Minutes Intravenous Every 8 hours 03/05/16 1003 03/07/16 1140   03/05/16 0030  ceFEPIme (MAXIPIME) 2 g in dextrose 5 % 50 mL IVPB     2 g 100 mL/hr over 30 Minutes Intravenous  Once 03/05/16 0027 03/05/16 0157   03/04/16 2315  cefTRIAXone (ROCEPHIN)  2 g in dextrose 5 % 50 mL IVPB     2 g 100 mL/hr over 30 Minutes Intravenous  Once 03/04/16 2314 03/04/16 2357       Assessment/Plan Sepsis Concern for necrotizing fasciitis, Fourniere's gangrene  - admitted 1/20 with sepsis suspected  to be 2/2 pyelonephritis; started on IV cefepime  - on arrival he had a fever of 102.8, with tachycardia (HR 130's), WBC 20.7, and initial lactic acid level of 2.83; now WBC 23.3, TMAX 102.9, and he remains tachycardic and hypertensive  - CT scan today shows necrotizing infection of the soft tissues of the medial right buttocks and right perineum consistent with Fourniere's gangrene  - currently on vancomycin and merrem  Uncontrolled IDDM HTN  ID - vancomycin 1/23>>, merrem 1/23>>, cefepime 1/21>>1/23, rocephin x1 dose 1/20 VTE - SCDs, hold lovenox for procedure FEN - IVF, NPO for procedure  Plan - Discussed with Dr. Lucia Gaskins, plan to take patient to OR for I&D right buttock abscess.   Continue IV antibiotics.   NPO for procedure.  Jerrye Beavers, [facility] Surgery 03/07/2016, 4:39 PM Pager: [phone] Consults: [phone] Mon-Fri 7:00 am-4:30 pm Sat-Sun 7:00 am-11:30 am  Agree with above. Dr. Hassell Done spoke to his wife on the phone.  He has induration of his entire right buttocks with a punctum draining fluid. This is consistent with what is seen on the CT scan. This is source of his sepsis.  He needs an I&D tonight.  He understands that this is a life threatening infection.  And that it could require several trips to the operating room.  Alphonsa Overall, MD, [facility] Surgery Pager: [phone] Office phone:  [phone]

## 2016-03-07 NOTE — Op Note (Signed)
03/04/2016 - 03/07/2016  7:16 PM  PATIENT:  Danny Fuller, 41 y.o., male, MRN: [id number]  PREOP DIAGNOSIS:  Right perianal/perneal abscess/necrotic tissue  POSTOP DIAGNOSIS:   Right perianal/perneal abscess/necrotic tissue  PROCEDURE:   Procedure(s): IRRIGATION AND DEBRIDEMENT PERIRECTAL/perineal ABSCESS  SURGEON:   Alphonsa Overall, M.D.  ANESTHESIA:   general  Anesthesiologist: Annye Asa, MD CRNA: Jinger Neighbors Williford, CRNA  General  EBL:  100  ml  DRAINS: wound packed with 4" curlex  LOCAL MEDICATIONS USED:   none  SPECIMEN:   Cultures of aerobes and anaerobes  COUNTS CORRECT:  YES  INDICATIONS FOR PROCEDURE:  Danny Fuller is a 41 y.o. (DOB: 1975-07-05) AA male whose primary care physician is [name], MD and comes for I&D of perianal/perineal abscess.   He was admitted on 03/03/2016 for sepsis suspected to be secondary to pyelonephritis.  But today, he started complaining of right buttocks pain.  A CT scan showed air in the soft tissues on the right buttock/perianal area that is of concern for a necrotizing soft tissue infection.   The indications and risks of the surgery were explained to the patient.  The risks include, but are not limited to, infection, bleeding, and nerve injury.  PROCEDURE:  The patient was taken to [facility] at [facility]  He was given a general anesthetic and placed in lithotomy position. He was already on IV antibiotics (Vancomycin/Merrem/Clindamycin).   His buttocks and perianal areas were prepped with betadine solution and sterilely draped.   A timeout was held and surgical checklist run.   I made a cruciate incision into the right perianal/perineal area and drained thin purulent fluid. I obtained aerobic and anaerobic cultures. I debrided dead soft tissue with a knife and scissors. I irrigated the wound with a mixture of saline and peroxide. The wound measured 7 x 6 cm.   I then packed the wound with saline damp 4 inch  Kerlix gauze. The wound was then dressed with 4 x 4's and ABDs.  Sponge and needle count were correct at the end of the case.   The patient was transported to the recovery room in good condition.  Alphonsa Overall, MD, [facility] Surgery Pager: [phone] Office phone:  [phone]

## 2016-03-07 NOTE — Anesthesia Postprocedure Evaluation (Signed)
Anesthesia Post Note  Patient: [name]  Procedure(s) Performed: Procedure(s) (LRB): IRRIGATION AND DEBRIDEMENT PERIRECTAL ABSCESS (Right)  Patient location during evaluation: PACU Anesthesia Type: General Level of consciousness: awake and alert, oriented and patient cooperative Pain management: pain level controlled Vital Signs Assessment: post-procedure vital signs reviewed and stable Respiratory status: spontaneous breathing, nonlabored ventilation, respiratory function stable and patient connected to nasal cannula oxygen Cardiovascular status: blood pressure returned to baseline and stable Postop Assessment: no signs of nausea or vomiting Anesthetic complications: no       Last Vitals:  Vitals:   03/07/16 2130 03/07/16 2135  BP: (!) 88/57 (!) 93/55  Pulse: (!) 104   Resp: (!) 22   Temp:      Last Pain:  Vitals:   03/07/16 2115  TempSrc:   PainSc: 0-No pain                 Jesse Hirst,E. Lourdes Manning

## 2016-03-07 NOTE — Transfer of Care (Signed)
Immediate Anesthesia Transfer of Care Note  Patient: Danny Fuller  Procedure(s) Performed: Procedure(s): IRRIGATION AND DEBRIDEMENT PERIRECTAL ABSCESS (Right)  Patient Location: PACU  Anesthesia Type:General  Level of Consciousness: awake, alert  and oriented  Airway & Oxygen Therapy: Patient Spontanous Breathing and Patient connected to face mask oxygen  Post-op Assessment: Report given to RN and Post -op Vital signs reviewed and stable  Post vital signs: Reviewed and stable  Last Vitals:  Vitals:   03/07/16 1538 03/07/16 1641  BP: (!) 182/79 (!) 153/73  Pulse: (!) 135 (!) 140  Resp: 17   Temp: (!) 38.2 C     Last Pain:  Vitals:   03/07/16 1538  TempSrc: Oral  PainSc:       Patients Stated Pain Goal: 4 ([id number])  Complications: No apparent anesthesia complications

## 2016-03-07 NOTE — Progress Notes (Signed)
NUTRITION NOTE  Pt seen during rounds this AM. Noted that pt has hx of uncontrolled Type 2 DM. Pt with persistent fever and some vomiting yesterday and x1 this AM. Pt would benefit from DM-related education prior to d/c once fevers N/V have resolved.     Jarome Matin, MS, RD, LDN, [facility] Inpatient Clinical Dietitian Pager # [phone] After hours/weekend pager # [phone]

## 2016-03-07 NOTE — Progress Notes (Signed)
Pharmacy Antibiotic Note  Danny Fuller is a 41 y.o. male admitted on 03/04/2016 with sepsis.  Pharmacy has been consulted for meropenem and vancomycin dosing.  Plan: Vancomycin 2g x 1 then 750mg  IV every 12 hours.  Goal trough 15-20 mcg/mL. Meropenem 1g IV q8  Height: 5\' 9"  (175.3 cm) Weight: 238 lb (108 kg) IBW/kg (Calculated) : 70.7  Temp (24hrs), Avg:101.6 F (38.7 C), Min:98.5 F (36.9 C), Max:102.9 F (39.4 C)   Recent Labs Lab 03/03/16 2058 03/04/16 1630 03/04/16 2141 03/04/16 2334 03/05/16 0112 03/06/16 0534 03/07/16 0549  WBC 14.0* 20.7*  --   --  20.1* 18.5* 23.3*  CREATININE 1.22 1.21  --   --  0.94 1.08 1.22  LATICACIDVEN  --   --  2.83* 1.15  --   --   --     Estimated Creatinine Clearance: 99.4 mL/min (by C-G formula based on SCr of 1.22 mg/dL).    No Known Allergies   Thank you for allowing pharmacy to be a part of this patient's care.   Adrian Saran, PharmD, BCPS Pager [phone] 03/07/2016 12:09 PM

## 2016-03-07 NOTE — Anesthesia Preprocedure Evaluation (Addendum)
Anesthesia Evaluation  Patient identified by MRN, date of birth, ID band Patient awake    Reviewed: Allergy & Precautions, NPO status , Patient's Chart, lab work & pertinent test results, reviewed documented beta blocker date and time   History of Anesthesia Complications Negative for: history of anesthetic complications  Airway Mallampati: II  TM Distance: >3 FB Neck ROM: Full    Dental  (+) Missing, Poor Dentition, Dental Advisory Given, Chipped   Pulmonary former smoker,    breath sounds clear to auscultation       Cardiovascular hypertension, Pt. on medications and Pt. on home beta blockers (-) angina Rhythm:Regular Rate:Tachycardia     Neuro/Psych  Headaches,    GI/Hepatic Neg liver ROS, GERD  Medicated and Controlled,N/v today with infection   Endo/Other  diabetes (glu 198), Insulin DependentMorbid obesity  Renal/GU negative Renal ROS     Musculoskeletal   Abdominal (+) + obese,   Peds  Hematology  (+) Blood dyscrasia (Hb 9.3), anemia ,   Anesthesia Other Findings   Reproductive/Obstetrics                            Anesthesia Physical Anesthesia Plan  ASA: III and emergent  Anesthesia Plan: General   Post-op Pain Management:    Induction: Intravenous and Rapid sequence  Airway Management Planned: Oral ETT  Additional Equipment:   Intra-op Plan:   Post-operative Plan: Extubation in OR  Informed Consent: I have reviewed the patients History and Physical, chart, labs and discussed the procedure including the risks, benefits and alternatives for the proposed anesthesia with the patient or authorized representative who has indicated his/her understanding and acceptance.   Dental advisory given  Plan Discussed with: CRNA and Surgeon  Anesthesia Plan Comments: (Plan routine monitors, GETA)        Anesthesia Quick Evaluation

## 2016-03-07 NOTE — Progress Notes (Addendum)
PROGRESS NOTE    CLANCE BENDALL  [id number] DOB: [birth year] DOA: 03/04/2016 PCP: [name], MD     Brief Narrative:  Danny Fuller is a 41 y.o. 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  He denied dysuria or hematuria. CT abd/pelvis was obtained which showed right pyelonephritis. He was admitted for right-sided pyelonephritis and started on IV antibiotics.   Blood culture as well as urine culture resulted negative. Patient failed to improve on IV cefepime and continued to be febrile. He no longer had any right flank or right abdominal pain. On afternoon of 1/23, he had acute complaint of right buttock pain with drainage. Antibiotics were broadened to merrem and vanco and general surgery was consulted for evaluation. They recommended repeat CT abd/pelvis to evaluate for possible abscess. CT abd/pelvis positive for Fournier gangrene.   Assessment & Plan:   Principal Problem:   Necrotizing fasciitis ([place]) Active Problems:   Essential hypertension   Sepsis ([place])   Diabetic polyneuropathy associated with type 2 diabetes mellitus ([place])   Pyelonephritis  Sepsis secondary to fournier gangrene  -Febrile, tachycardic, leukocytosis. Patient initially complained of right flank and right abdominal pain. Initial CT abd/pelvis showed right pyelonephritis. Patient started on IV cefepime, cultures drawn.  -Blood cultures negative to date  -Urine culture negative  -On afternoon of 1/23, new complaint of right buttock pain, exam with edema and induration, extending from right medial gluteus to perineum, without crepitus, without swelling of scrotum. Spoke with urology. They would be happy to consult if warranted by  general surgery recommendations. Spoke with general surgery and stat CT abd/pelvis was obtained. This showed necrotizing infection of medial right buttocks and right perineum. General surgery aware of results and patient will need surgical debridement. Antibiotics broadened to merrem/vanco/clindamycin.  -IVF   Uncontrolled insulin-dependent diabetes mellitus type 2 -Ha1c 11.6 -Continue Lantus, sliding scale insulin -Continue Lyrica, tramadol for neuropathy  Essential hypertension -Continue amlodipine, clonidine, lisinopril, hydralazine, labetalol    DVT prophylaxis: lovenox Code Status: Full Family Communication: no family at bedside Disposition Plan: pending further improvement and stabilization    Consultants:   Urology over the phone  General surgery   Procedures:   None  Antimicrobials:   Cefepime >>> stopped 1/23  Merrem/Vanco/Clindamycin 1/23 >>>    Subjective: During morning rounds: Patient had no complaints beside overall malaise and continued fevers. No further right flank or right abdominal pain. No joint pains other than chronic shoulder arthritis.   I was then called by RN this afternoon as patient had new complaints of right buttock pain with drainage in his underwear.     Objective: Vitals:   03/07/16 0539 03/07/16 0802 03/07/16 1035 03/07/16 1125  BP: 128/70     Pulse: (!) 112     Resp: 19     Temp: (!) 101.8 F (38.8 C) (!) 101.4 F (38.6 C) (!) 102.7 F (39.3 C) (!) 102.9 F (39.4 C)  TempSrc: Oral Oral Oral Oral  SpO2: 98%     Weight:      Height:        Intake/Output Summary (Last 24 hours) at 03/07/16 1207 Last data filed at 03/07/16 0600  Gross per 24 hour  Intake  2817 ml  Output              400 ml  Net             2417 ml   Filed Weights   03/04/16 1549  Weight: 108 kg (238 lb)    Examination:  General exam: diaphoretic, uncomfortable appearing overall  Respiratory system: Clear to auscultation. Respiratory  effort normal. Cardiovascular system: S1 & S2 heard, tachycardic, regular. No JVD, murmurs, rubs, gallops or clicks. No pedal edema. Gastrointestinal system: Abdomen is nondistended, soft and nontender. No organomegaly or masses felt. Normal bowel sounds heard. Central nervous system: Alert and oriented. No focal neurological deficits. Extremities: Symmetric 5 x 5 power. No open wounds of feet to indicate osteomyelitis  Skin: +right lower buttock with induration, warmth, tender to palpation, does not seem to involve scrotum, had serosanguinous drainage in underwear  Psychiatry: Judgement and insight appear normal. Mood & affect appropriate.   Data Reviewed: I have personally reviewed following labs and imaging studies  CBC:  Recent Labs Lab 03/03/16 2058 03/04/16 1630 03/05/16 0112 03/06/16 0534 03/07/16 0549  WBC 14.0* 20.7* 20.1* 18.5* 23.3*  NEUTROABS  --   --  14.9* 14.7* 19.5*  HGB 12.3* 13.0 10.9* 9.6* 9.3*  HCT 35.3* 36.9* 30.8* 27.4* 26.5*  MCV 84.9 85.2 84.8 84.8 85.8  PLT 303 304 275 242 0000000   Basic Metabolic Panel:  Recent Labs Lab 03/03/16 2058 03/04/16 1630 03/05/16 0112 03/06/16 0534 03/07/16 0549  NA 130* 129* 134* 131* 129*  K 4.0 4.2 4.3 4.0 4.7  CL 97* 96* 103 103 102  CO2 23 23 24  21* 17*  GLUCOSE 302* 395* 332* 291* 315*  BUN 18 16 13 15 16   CREATININE 1.22 1.21 0.94 1.08 1.22  CALCIUM 9.3 9.1 8.4* 8.1* 8.1*   GFR: Estimated Creatinine Clearance: 99.4 mL/min (by C-G formula based on SCr of 1.22 mg/dL). Liver Function Tests:  Recent Labs Lab 03/03/16 2058 03/04/16 1630  AST 14* 18  ALT 13* 15*  ALKPHOS 82 91  BILITOT 0.5 1.2  PROT 7.7 8.1  ALBUMIN 3.8 4.2    Recent Labs Lab 03/04/16 1630  LIPASE 16   No results for input(s): AMMONIA in the last 168 hours. Coagulation Profile: No results for input(s): INR, PROTIME in the last 168 hours. Cardiac Enzymes: No results for input(s): CKTOTAL, CKMB, CKMBINDEX, TROPONINI in the last 168  hours. BNP (last 3 results) No results for input(s): PROBNP in the last 8760 hours. HbA1C:  Recent Labs  03/06/16 0534  HGBA1C 11.6*   CBG:  Recent Labs Lab 03/06/16 0726 03/06/16 1203 03/06/16 1623 03/06/16 2115 03/07/16 0757  GLUCAP 276* 243* 212* 261* 324*   Lipid Profile: No results for input(s): CHOL, HDL, LDLCALC, TRIG, CHOLHDL, LDLDIRECT in the last 72 hours. Thyroid Function Tests: No results for input(s): TSH, T4TOTAL, FREET4, T3FREE, THYROIDAB in the last 72 hours. Anemia Panel: No results for input(s): VITAMINB12, FOLATE, FERRITIN, TIBC, IRON, RETICCTPCT in the last 72 hours. Sepsis Labs:  Recent Labs Lab 03/04/16 2141 03/04/16 2334 03/05/16 0112  PROCALCITON  --   --  0.65  LATICACIDVEN 2.83* 1.15  --     Recent Results (from the past 240 hour(s))  Urine culture     Status: None   Collection Time: 03/04/16  9:50 PM  Result Value Ref Range Status   Specimen Description URINE, CLEAN CATCH  Final   Special Requests NONE  Final   Culture   Final  NO GROWTH Performed at [facility], [place] [address] [place], [place] [address]    Report Status 03/06/2016 FINAL  Final  Blood Culture (routine x 2)     Status: None (Preliminary result)   Collection Time: 03/04/16 11:18 PM  Result Value Ref Range Status   Specimen Description BLOOD RIGHT ANTECUBITAL  Final   Special Requests BOTTLES DRAWN AEROBIC AND ANAEROBIC 6CC  Final   Culture   Final    NO GROWTH 1 DAY Performed at [facility], [place] [address] [place], [place] [address]    Report Status PENDING  Incomplete  Blood Culture (routine x 2)     Status: None (Preliminary result)   Collection Time: 03/04/16 11:18 PM  Result Value Ref Range Status   Specimen Description BLOOD RIGHT HAND  Final   Special Requests BOTTLES DRAWN AEROBIC AND ANAEROBIC 6 CC  Final   Culture   Final    NO GROWTH 1 DAY Performed at [facility], [place] [address] [place], [place] [address]    Report Status  PENDING  Incomplete       Radiology Studies: No results found.    Scheduled Meds: . amLODipine  10 mg Oral Daily  . aspirin  324 mg Oral Once  . cloNIDine  0.1 mg Oral BID  . enoxaparin (LOVENOX) injection  50 mg Subcutaneous QHS  . hydrALAZINE  25 mg Oral Q8H  . insulin aspart  0-20 Units Subcutaneous TID WC  . insulin aspart  0-5 Units Subcutaneous QHS  . insulin glargine  25 Units Subcutaneous Q2200  . labetalol  200 mg Oral BID  . lisinopril  20 mg Oral Daily  . pregabalin  75 mg Oral BID  . sodium chloride flush  3 mL Intravenous Q12H   Continuous Infusions: . sodium chloride 125 mL/hr at 03/07/16 1023     LOS: 3 days    Time spent: 50 minutes   Dessa Phi, DO [facility] [email or web address] Password [facility] 03/07/2016, 12:07 PM

## 2016-03-07 NOTE — Anesthesia Procedure Notes (Signed)
Procedure Name: Intubation Date/Time: 03/07/2016 6:41 PM Performed by: Noralyn Pick D Pre-anesthesia Checklist: Patient identified, Emergency Drugs available, Suction available and Patient being monitored Patient Re-evaluated:Patient Re-evaluated prior to inductionOxygen Delivery Method: Circle system utilized Preoxygenation: Pre-oxygenation with 100% oxygen Intubation Type: IV induction, Rapid sequence and Cricoid Pressure applied Laryngoscope Size: Mac and 4 Tube type: Oral Tube size: 7.5 mm Number of attempts: 1 Airway Equipment and Method: Stylet and Oral airway Placement Confirmation: ETT inserted through vocal cords under direct vision,  positive ETCO2 and breath sounds checked- equal and bilateral Tube secured with: Tape Dental Injury: Teeth and Oropharynx as per pre-operative assessment

## 2016-03-08 ENCOUNTER — Encounter (HOSPITAL_COMMUNITY): Payer: Self-pay | Admitting: Surgery

## 2016-03-08 DIAGNOSIS — A419 Sepsis, unspecified organism: Principal | ICD-10-CM

## 2016-03-08 DIAGNOSIS — E1142 Type 2 diabetes mellitus with diabetic polyneuropathy: Secondary | ICD-10-CM

## 2016-03-08 DIAGNOSIS — M726 Necrotizing fasciitis: Secondary | ICD-10-CM

## 2016-03-08 DIAGNOSIS — I10 Essential (primary) hypertension: Secondary | ICD-10-CM

## 2016-03-08 LAB — BASIC METABOLIC PANEL
Anion gap: 7 (ref 5–15)
BUN: 17 mg/dL (ref 6–20)
CHLORIDE: 104 mmol/L (ref 101–111)
CO2: 20 mmol/L — ABNORMAL LOW (ref 22–32)
Calcium: 8 mg/dL — ABNORMAL LOW (ref 8.9–10.3)
Creatinine, Ser: 1.14 mg/dL (ref 0.61–1.24)
GFR calc Af Amer: >60 mL/min (ref >60)
GFR calc non Af Amer: >60 mL/min (ref >60)
GLUCOSE: 235 mg/dL — AB (ref 65–99)
POTASSIUM: 4.5 mmol/L (ref 3.5–5.1)
Sodium: 131 mmol/L — ABNORMAL LOW (ref 135–145)

## 2016-03-08 LAB — CBC WITH DIFFERENTIAL/PLATELET
BASOS PCT: 0 %
Basophils Absolute: 0 10*3/uL (ref 0.0–0.1)
EOS PCT: 0 %
Eosinophils Absolute: 0 10*3/uL (ref 0.0–0.7)
HCT: 25.1 % — ABNORMAL LOW (ref 39.0–52.0)
HEMOGLOBIN: 8.5 g/dL — AB (ref 13.0–17.0)
LYMPHS PCT: 6 %
Lymphs Abs: 1.5 10*3/uL (ref 0.7–4.0)
MCH: 28.5 pg (ref 26.0–34.0)
MCHC: 33.9 g/dL (ref 30.0–36.0)
MCV: 84.2 fL (ref 78.0–100.0)
MONO ABS: 2.3 10*3/uL — AB (ref 0.1–1.0)
Monocytes Relative: 9 %
NEUTROS ABS: 21.3 10*3/uL — AB (ref 1.7–7.7)
NEUTROS PCT: 85 %
Platelets: 307 10*3/uL (ref 150–400)
RBC: 2.98 MIL/uL — ABNORMAL LOW (ref 4.22–5.81)
RDW: 13.3 % (ref 11.5–15.5)
WBC: 25.1 10*3/uL — ABNORMAL HIGH (ref 4.0–10.5)

## 2016-03-08 LAB — GLUCOSE, CAPILLARY
GLUCOSE-CAPILLARY: 209 mg/dL — AB (ref 65–99)
Glucose-Capillary: 212 mg/dL — ABNORMAL HIGH (ref 65–99)
Glucose-Capillary: 212 mg/dL — ABNORMAL HIGH (ref 65–99)
Glucose-Capillary: 214 mg/dL — ABNORMAL HIGH (ref 65–99)

## 2016-03-08 MED ORDER — MORPHINE SULFATE (PF) 2 MG/ML IV SOLN
2.0000 mg | Freq: Once | INTRAVENOUS | Status: DC
  Filled 2016-03-08: qty 1

## 2016-03-08 MED ORDER — MORPHINE SULFATE (PF) 2 MG/ML IV SOLN
2.0000 mg | INTRAVENOUS | Status: DC | PRN
  Administered 2016-03-08 – 2016-03-15 (×21): 2 mg via INTRAVENOUS
  Filled 2016-03-08 (×21): qty 1

## 2016-03-08 MED ORDER — LABETALOL HCL 200 MG PO TABS: 200.0000 mg | ORAL_TABLET | Freq: Two times a day (BID) | ORAL | Status: DC

## 2016-03-08 MED ORDER — HYDRALAZINE HCL 20 MG/ML IJ SOLN: 10.0000 mg | INTRAMUSCULAR | Status: DC | PRN

## 2016-03-08 MED ORDER — AMLODIPINE BESYLATE 10 MG PO TABS
10.0000 mg | ORAL_TABLET | Freq: Every day | ORAL | Status: DC
  Administered 2016-03-09 – 2016-03-15 (×7): 10 mg via ORAL
  Filled 2016-03-08 (×7): qty 1

## 2016-03-08 MED ORDER — HYDRALAZINE HCL 25 MG PO TABS
25.0000 mg | ORAL_TABLET | Freq: Three times a day (TID) | ORAL | Status: DC
  Administered 2016-03-09 – 2016-03-15 (×18): 25 mg via ORAL
  Filled 2016-03-08 (×20): qty 1

## 2016-03-08 MED ORDER — MORPHINE SULFATE (PF) 2 MG/ML IV SOLN
2.0000 mg | Freq: Once | INTRAVENOUS | Status: AC
  Administered 2016-03-08: 2 mg via INTRAVENOUS

## 2016-03-08 MED ORDER — MORPHINE SULFATE (PF) 2 MG/ML IV SOLN
INTRAVENOUS | Status: AC
  Administered 2016-03-08: 2 mg via INTRAVENOUS
  Filled 2016-03-08: qty 1

## 2016-03-08 NOTE — Progress Notes (Signed)
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 ED on 03/05/16 with 2-3 days history of right-sided abdominal pain, right flank pain, urinary frequency, hesitancy, weakness and fatigue. He met sepsis criteria on admission, code sepsis was activated and started on IV cefepime for presumed acute pyelonephritis. CT abdomen and pelvis showed right pyelonephritis. Blood and urine cultures were negative but patient failed to improve on IV cefepime and continued to have fevers. Right flank and abdominal pain resolved. On 1/23, complained of acute right buttock pain with drainage. Repeat imaging confirmed Fournier's gangrene, Gen. surgery performed I&D in the OR on 1/23, postop he developed hypotension and was transferred to stepdown unit. Hypotension resolved. Slowly improving.   Assessment & Plan:   Principal Problem:   Necrotizing fasciitis ([place]) Active Problems:   Essential hypertension   Sepsis ([place])   Diabetic polyneuropathy associated with type 2 diabetes mellitus ([place])   Pyelonephritis   1. Sepsis secondary to Fournier's gangrene: As indicated above, initially treated for presumed right-sided acute pyelonephritis based on clinical picture and CT abdomen. Blood and urine culture results from admission negative to date. Continued to spike fevers. On 1/23 new complaints of right buttock pain and drainage. CT abdomen and pelvis confirmed Fournier's gangrene. General surgery was consulted and performed I&D in on 1/23. Postop developed hypotension and was transferred to stepdown unit for close monitoring and management. Hypotension resolved with IV fluids. Antibiotics were broadened to IV meropenem, vancomycin and clindamycin. Management per  surgery. Undergoing wound care. Pain management. Monitor BMP closely while on vancomycin (5 history of acute kidney injury secondary to vancomycin). Follow wound culture results. 2. Uncontrolled insulin-dependent type 2 diabetes with polyneuropathy: Continue Lantus 25 units at bedtime and resistant SSI. CBGs ranging in the low 200s. This may be related to stress response. Monitor closely and may need further titration of insulins and or addition of mealtime NovoLog. Hemoglobin A1c: 11.6. Continue Lyrica. Tramadol discontinued. 3. Essential hypertension: Transient postop hypotension has resolved. Pharmacy reviewed home medications again on 1/24. He does not take labetalol and was discontinued. Due to mild hypotension, a.m. meds held for today. Reassess in a.m. 4. Sinus tachycardia: Due to problem #1 and pain. Treatment as above and monitor. Clonidine as blood pressure tolerates. Also continue IV fluids for additional 24 hours. 5. Anemia: Baseline hemoglobin may be in the 10-11 range. This is dropped to 8.5 which may be related to acute illness, infection and surgical blood loss. Follow CBCs in a.m. and transfuse if hemoglobin <7 g per DL. 6. Leukocytosis: Secondary to sepsis. Antimicrobial management as above. Follow CBCs.   DVT prophylaxis: Lovenox Code Status: Full Family Communication: None at bedside Disposition Plan: Transfer to stepdown unit on 1/23. Continue management in stepdown unit for now.   Consultants:   General surgery  Procedures:   I&D of perirectal/perineal abscess by general surgery on 03/07/16  Antimicrobials:   IV cefepime 1/20 > 1/23  IV clindamycin 1/23 >  IV meropenem 1/23 >  IV vancomycin 1/23 >    Subjective: Seen this morning. Complained of appropriate postop sacral pain. No chest pain or dyspnea reported. As per the RN, no acute issues.  Objective:  Vitals:   03/08/16 1100 03/08/16 1200 03/08/16 1215 03/08/16 1236  BP: (!) 104/55 Marland Kitchen)  90/59 107/61     Pulse: (!) 104 97 97   Resp: 14 13 17    Temp:    97.8 F (36.6 C)  TempSrc:    Oral  SpO2: 97% 98% 99%   Weight:      Height:        Intake/Output Summary (Last 24 hours) at 03/08/16 1707 Last data filed at 03/08/16 1500  Gross per 24 hour  Intake           5312.5 ml  Output              475 ml  Net           4837.5 ml   Filed Weights   03/04/16 1549 03/07/16 2200  Weight: 108 kg (238 lb) 116 kg (255 lb 11.7 oz)    Examination:  General exam: Pleasant young male lying comfortably in left lateral position this morning. Respiratory system: Clear to auscultation. Respiratory effort normal. Cardiovascular system: S1 & S2 heard, RRR. No JVD, murmurs, rubs, gallops or clicks. No pedal edema. Telemetry: Sinus tachycardia in the 110s. Gastrointestinal system: Abdomen is nondistended, soft and nontender. No organomegaly or masses felt. Normal bowel sounds heard. Central nervous system: Alert and oriented. No focal neurological deficits. Extremities: Symmetric 5 x 5 power. Skin: No rashes, lesions or ulcers. Sacral area dressing clean and dry-changed by general surgery prior to my exam. Psychiatry: Judgement and insight appear normal. Mood & affect appropriate.     Data Reviewed: I have personally reviewed following labs and imaging studies  CBC:  Recent Labs Lab 03/05/16 0112 03/06/16 0534 03/07/16 0549 03/07/16 2228 03/08/16 0321  WBC 20.1* 18.5* 23.3* 23.8* 25.1*  NEUTROABS 14.9* 14.7* 19.5*  --  21.3*  HGB 10.9* 9.6* 9.3* 8.8* 8.5*  HCT 30.8* 27.4* 26.5* 25.2* 25.1*  MCV 84.8 84.8 85.8 85.7 84.2  PLT 275 242 278 272 937   Basic Metabolic Panel:  Recent Labs Lab 03/05/16 0112 03/06/16 0534 03/07/16 0549 03/07/16 2228 03/08/16 0321  NA 134* 131* 129* 133* 131*  K 4.3 4.0 4.7 4.4 4.5  CL 103 103 102 106 104  CO2 24 21* 17* 19* 20*  GLUCOSE 332* 291* 315* 180* 235*  BUN 13 15 16 15 17   CREATININE 0.94 1.08 1.22 1.23 1.14  CALCIUM 8.4* 8.1* 8.1* 7.9* 8.0*    GFR: Estimated Creatinine Clearance: 110.4 mL/min (by C-G formula based on SCr of 1.14 mg/dL). Liver Function Tests:  Recent Labs Lab 03/03/16 2058 03/04/16 1630  AST 14* 18  ALT 13* 15*  ALKPHOS 82 91  BILITOT 0.5 1.2  PROT 7.7 8.1  ALBUMIN 3.8 4.2    Recent Labs Lab 03/04/16 1630  LIPASE 16   No results for input(s): AMMONIA in the last 168 hours. Coagulation Profile: No results for input(s): INR, PROTIME in the last 168 hours. Cardiac Enzymes: No results for input(s): CKTOTAL, CKMB, CKMBINDEX, TROPONINI in the last 168 hours. BNP (last 3 results) No results for input(s): PROBNP in the last 8760 hours. HbA1C:  Recent Labs  03/06/16 0534  HGBA1C 11.6*   CBG:  Recent Labs Lab 03/07/16 1955 03/07/16 2351 03/08/16 0902 03/08/16 1145 03/08/16 1636  GLUCAP 194* 209* 212* 209* 212*   Lipid Profile: No results for input(s): CHOL, HDL, LDLCALC, TRIG, CHOLHDL, LDLDIRECT in the last 72 hours. Thyroid Function Tests: No results for input(s): TSH, T4TOTAL, FREET4, T3FREE, THYROIDAB in the last 72 hours. Anemia Panel: No results for input(s): VITAMINB12, FOLATE, FERRITIN, TIBC, IRON,  RETICCTPCT in the last 72 hours.  Sepsis Labs:  Recent Labs Lab 03/04/16 2141 03/04/16 2334 03/05/16 0112 03/06/16 0534 03/07/16 0549 03/07/16 2228 03/08/16 0321  PROCALCITON  --   --  0.65  --   --   --   --   WBC  --   --  20.1* 18.5* 23.3* 23.8* 25.1*  LATICACIDVEN 2.83* 1.15  --   --   --   --   --     Recent Results (from the past 240 hour(s))  Urine culture     Status: None   Collection Time: 03/04/16  9:50 PM  Result Value Ref Range Status   Specimen Description URINE, CLEAN CATCH  Final   Special Requests NONE  Final   Culture   Final    NO GROWTH Performed at [facility], [place] [address], [place], [place] [address]    Report Status 03/06/2016 FINAL  Final  Blood Culture (routine x 2)     Status: None (Preliminary result)   Collection Time: 03/04/16  11:18 PM  Result Value Ref Range Status   Specimen Description BLOOD RIGHT ANTECUBITAL  Final   Special Requests BOTTLES DRAWN AEROBIC AND ANAEROBIC 6CC  Final   Culture   Final    NO GROWTH 3 DAYS Performed at [facility], [place] [address], [place], [place] [address]    Report Status PENDING  Incomplete  Blood Culture (routine x 2)     Status: None (Preliminary result)   Collection Time: 03/04/16 11:18 PM  Result Value Ref Range Status   Specimen Description BLOOD RIGHT HAND  Final   Special Requests BOTTLES DRAWN AEROBIC AND ANAEROBIC 6 CC  Final   Culture   Final    NO GROWTH 3 DAYS Performed at [facility], [place] [address], [place], [place] [address]    Report Status PENDING  Incomplete  Anaerobic culture     Status: None (Preliminary result)   Collection Time: 03/07/16  7:07 PM  Result Value Ref Range Status   Specimen Description WOUND  Final   Special Requests   Final    RIGHT BUTTOCKS Performed at [facility], [place] [address] [place], [place] [address]    Culture PENDING  Incomplete   Report Status PENDING  Incomplete  Aerobic Culture (superficial specimen)     Status: None (Preliminary result)   Collection Time: 03/07/16  7:07 PM  Result Value Ref Range Status   Specimen Description WOUND  Final   Special Requests RIGHT BUTTOCKS  Final   Gram Stain   Final    RARE WBC PRESENT, PREDOMINANTLY PMN ABUNDANT GRAM POSITIVE RODS MODERATE GRAM POSITIVE COCCI IN CLUSTERS MODERATE GRAM NEGATIVE RODS Performed at [facility], [place] [address] [place], [place] [address]    Culture PENDING  Incomplete   Report Status PENDING  Incomplete  MRSA PCR Screening     Status: None   Collection Time: 03/07/16 10:00 PM  Result Value Ref Range Status   MRSA by PCR NEGATIVE NEGATIVE Final    Comment:        The GeneXpert MRSA Assay (FDA approved for NASAL specimens only), is one component of a comprehensive MRSA colonization surveillance program. It is  not intended to diagnose MRSA infection nor to guide or monitor treatment for MRSA infections.          Radiology Studies: Ct Abdomen Pelvis W Contrast  Result Date: 03/07/2016 CLINICAL DATA:  Elevated white blood cell count, new pain in the right gluteal region, evaluate for abscess EXAM: CT ABDOMEN AND PELVIS WITH CONTRAST TECHNIQUE: Multidetector CT imaging of the abdomen and pelvis was performed using the standard protocol following bolus administration of intravenous contrast. CONTRAST:  127m ISOVUE-300 IOPAMIDOL (ISOVUE-300) INJECTION 61%, 385mISOVUE-300 IOPAMIDOL (ISOVUE-300) INJECTION 61% COMPARISON:  CT abdomen pelvis 03/04/2016 FINDINGS: Lower chest: The lung bases are clear. Hepatobiliary: The liver is somewhat low in attenuation which may indicate fatty infiltration. A single small fully defined low-attenuation structures present in the right lobe on image 24 probably representing hemangioma. No calcified gallstones are seen. Pancreas: The pancreas is normal in size and the pancreatic duct is not dilated. Spleen: The spleen is unremarkable. Adrenals/Urinary Tract: The adrenal glands are stable in contour. The kidneys enhance with no change in the small cysts in the lower pole of the left kidney. No hydronephrosis is noted. The ureters are normal in caliber. There is more perinephric strandiness particularly over the lower pole of the right kidney which is nonspecific but could indicate mild inflammation. The urinary bladder is not well distended but no abnormality is seen. Stomach/Bowel: The stomach is not well distended but no abnormality is seen. No small bowel distention is noted. The colon is largely decompressed with a moderate amount of feces present. The terminal ileum and the appendix are unremarkable. Vascular/Lymphatic: The abdominal aorta is normal in caliber with moderate abdominal aortic atherosclerosis present. No adenopathy is seen. Borderline right groin nodes are  unchanged compared to the prior study. Reproductive: The prostate is small. Other: There is inflammation and soft tissue gas present within the right medial buttocks primarily in the fat planes extending into the perineum near the base of the scrotum. These findings are consistent with necrotizing soft tissue infection, often termed Fourniere's gangrene. Musculoskeletal: The lumbar vertebrae are in normal alignment. No compression deformity is seen. Intervertebral disc spaces appear normal. IMPRESSION: 1. 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 Critical Value/emergent results were called by telephone at the time of interpretation on 03/07/2016 at 3:54 pm to Dr. JeDessa Phi who verbally acknowledged these results. 2. Suspect fatty infiltration of the liver. 3. Slightly more perinephric strandiness than on the prior exam may indicate infection but no obstruction is evident. 4. Moderate abdominal aortic atherosclerosis for age. Electronically Signed   By: PaIvar Drape.D.   On: 03/07/2016 15:56        Scheduled Meds: . [START ON 03/09/2016] amLODipine  10 mg Oral Daily  . clindamycin (CLEOCIN) IV  900 mg Intravenous Q8H  . cloNIDine  0.1 mg Oral BID  . enoxaparin (LOVENOX) injection  50 mg Subcutaneous QHS  . [START ON 03/09/2016] hydrALAZINE  25 mg Oral Q8H  . insulin aspart  0-20 Units Subcutaneous TID WC  . insulin aspart  0-5 Units Subcutaneous QHS  . insulin glargine  25 Units Subcutaneous Q2200  . lisinopril  20 mg Oral Daily  . meropenem (MERREM) IV  1 g Intravenous Q8H  . pregabalin  75 mg Oral BID  . sodium chloride flush  3 mL Intravenous Q12H  . vancomycin  750 mg Intravenous Q12H   Continuous Infusions: . sodium chloride 125 mL/hr at 03/08/16 1500     LOS: 4 days        [facility] Pager [phone] 7PM-7AM, please contact  night-coverage [email or web address] Password [email or web address] 03/08/2016, 5:07 PM

## 2016-03-08 NOTE — Progress Notes (Signed)
1 Day Post-Op  Subjective: Dressing taken down and packing removed, redressed, but I did not get nearly as much in as Dr. Lucia Gaskins had.  Will start BID dressing changes.    Objective: Vital signs in last 24 hours: Temp:  [98.1 F (36.7 C)-103.1 F (39.5 C)] 99.5 F (37.5 C) (01/24 0400) Pulse Rate:  [104-140] 115 (01/24 0800) Resp:  [4-25] 17 (01/24 0800) BP: (77-182)/(50-84) 145/74 (01/24 0800) SpO2:  [95 %-100 %] 98 % (01/24 0800) Weight:  [116 kg (255 lb 11.7 oz)] 116 kg (255 lb 11.7 oz) (01/23 2200) Last BM Date: 03/04/16 3462 Urine 1175 TM 103; tachycardia Na 131 Glucose 235 WBC 25.1 H/H 8.5/25   Intake/Output from previous day: 01/23 0701 - 01/24 0700 In: 4012.5 [I.V.:3462.5; IV Piggyback:550] Out: [id number] [Urine:1175] Intake/Output this shift: Total I/O In: 250 [I.V.:250] Out: -   General appearance: alert, cooperative and no distress Skin: Skin color, texture, turgor normal. No rashes or lesions or dressing taken down from the right rectum open area.  It is clean.  some odor,  Repacked and we will start BID dressing changes.    Lab Results:   Recent Labs  03/07/16 2228 03/08/16 0321  WBC 23.8* 25.1*  HGB 8.8* 8.5*  HCT 25.2* 25.1*  PLT 272 307    BMET  Recent Labs  03/07/16 2228 03/08/16 0321  NA 133* 131*  K 4.4 4.5  CL 106 104  CO2 19* 20*  GLUCOSE 180* 235*  BUN 15 17  CREATININE 1.23 1.14  CALCIUM 7.9* 8.0*   PT/INR No results for input(s): LABPROT, INR in the last 72 hours.   Recent Labs Lab 03/03/16 2058 03/04/16 1630  AST 14* 18  ALT 13* 15*  ALKPHOS 82 91  BILITOT 0.5 1.2  PROT 7.7 8.1  ALBUMIN 3.8 4.2     Lipase     Component Value Date/Time   LIPASE 16 03/04/2016 1630     Studies/Results: Ct Abdomen Pelvis W Contrast  Result Date: 03/07/2016 CLINICAL DATA:  Elevated white blood cell count, new pain in the right gluteal region, evaluate for abscess EXAM: CT ABDOMEN AND PELVIS WITH CONTRAST TECHNIQUE: Multidetector CT  imaging of the abdomen and pelvis was performed using the standard protocol following bolus administration of intravenous contrast. CONTRAST:  141mL ISOVUE-300 IOPAMIDOL (ISOVUE-300) INJECTION 61%, 46mL ISOVUE-300 IOPAMIDOL (ISOVUE-300) INJECTION 61% COMPARISON:  CT abdomen pelvis 03/04/2016 FINDINGS: Lower chest: The lung bases are clear. Hepatobiliary: The liver is somewhat low in attenuation which may indicate fatty infiltration. A single small fully defined low-attenuation structures present in the right lobe on image 24 probably representing hemangioma. No calcified gallstones are seen. Pancreas: The pancreas is normal in size and the pancreatic duct is not dilated. Spleen: The spleen is unremarkable. Adrenals/Urinary Tract: The adrenal glands are stable in contour. The kidneys enhance with no change in the small cysts in the lower pole of the left kidney. No hydronephrosis is noted. The ureters are normal in caliber. There is more perinephric strandiness particularly over the lower pole of the right kidney which is nonspecific but could indicate mild inflammation. The urinary bladder is not well distended but no abnormality is seen. Stomach/Bowel: The stomach is not well distended but no abnormality is seen. No small bowel distention is noted. The colon is largely decompressed with a moderate amount of feces present. The terminal ileum and the appendix are unremarkable. Vascular/Lymphatic: The abdominal aorta is normal in caliber with moderate abdominal aortic atherosclerosis present. No adenopathy is  seen. Borderline right groin nodes are unchanged compared to the prior study. Reproductive: The prostate is small. Other: There is inflammation and soft tissue gas present within the right medial buttocks primarily in the fat planes extending into the perineum near the base of the scrotum. These findings are consistent with necrotizing soft tissue infection, often termed Fourniere's gangrene. Musculoskeletal: The  lumbar vertebrae are in normal alignment. No compression deformity is seen. Intervertebral disc spaces appear normal. IMPRESSION: 1. 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 Critical Value/emergent results were called by telephone at the time of interpretation on 03/07/2016 at 3:54 pm to Dr. Dessa Phi , who verbally acknowledged these results. 2. Suspect fatty infiltration of the liver. 3. Slightly more perinephric strandiness than on the prior exam may indicate infection but no obstruction is evident. 4. Moderate abdominal aortic atherosclerosis for age. Electronically Signed   By: Ivar Drape M.D.   On: 03/07/2016 15:56    Medications: . amLODipine  10 mg Oral Daily  . clindamycin (CLEOCIN) IV  900 mg Intravenous Q8H  . cloNIDine  0.1 mg Oral BID  . enoxaparin (LOVENOX) injection  50 mg Subcutaneous QHS  . hydrALAZINE  25 mg Oral Q8H  . insulin aspart  0-20 Units Subcutaneous TID WC  . insulin aspart  0-5 Units Subcutaneous QHS  . insulin glargine  25 Units Subcutaneous Q2200  . labetalol  200 mg Oral BID  . lisinopril  20 mg Oral Daily  . meropenem (MERREM) IV  1 g Intravenous Q8H  .  morphine injection  2 mg Intravenous Once  . pregabalin  75 mg Oral BID  . sodium chloride flush  3 mL Intravenous Q12H  . vancomycin  750 mg Intravenous Q12H   . sodium chloride 125 mL/hr at 03/08/16 0900    RIGHT BUTTOCKS   Gram Stain RARE WBC PRESENT, PREDOMINANTLY PMN  ABUNDANT GRAM POSITIVE RODS  MODERATE GRAM POSITIVE COCCI IN CLUSTERS  MODERATE GRAM NEGATIVE RODS       Assessment/Plan Right perianal/perneal abscess/necrotic tissue IRRIGATION AND DEBRIDEMENT PERIRECTAL/perineal ABSCESS, 02/26/16, Dr. Lucia Gaskins  AODM Hypertension  GERD Headaches  FEN:  IV fluids/clears ID:  [id number] IJ 03/04/16 =>> day 5 / Clindamycin & Vancomycin 03/07/16 =>>  Day 2 DVT:  SCD   Plan:  He can be started on a diet from our  standpoint.  Start BID dressing changes. Add hydro therapy tomorrow, just to get area irrigated well.  Nothing to debride currently.   Continue antibiotics.  Nothing except multiple organisms on culture so far.     LOS: 4 days    Greenlee Ancheta 03/08/2016 [phone]

## 2016-03-09 ENCOUNTER — Encounter (HOSPITAL_COMMUNITY): Payer: Self-pay | Admitting: Registered Nurse

## 2016-03-09 ENCOUNTER — Other Ambulatory Visit: Payer: BC Managed Care – PPO

## 2016-03-09 ENCOUNTER — Inpatient Hospital Stay (HOSPITAL_COMMUNITY): Payer: BC Managed Care – PPO | Admitting: Registered Nurse

## 2016-03-09 ENCOUNTER — Encounter (HOSPITAL_COMMUNITY)
Admission: EM | Disposition: A | Discharge status: Home Health | Payer: Self-pay | Source: Home / Self Care | Attending: Internal Medicine

## 2016-03-09 HISTORY — PX: INCISION AND DRAINAGE PERIRECTAL ABSCESS: SHX1804

## 2016-03-09 LAB — GLUCOSE, CAPILLARY
GLUCOSE-CAPILLARY: 222 mg/dL — AB (ref 65–99)
GLUCOSE-CAPILLARY: 151 mg/dL — AB (ref 65–99)
GLUCOSE-CAPILLARY: 188 mg/dL — AB (ref 65–99)
GLUCOSE-CAPILLARY: 236 mg/dL — AB (ref 65–99)
Glucose-Capillary: 189 mg/dL — ABNORMAL HIGH (ref 65–99)
Glucose-Capillary: 165 mg/dL — ABNORMAL HIGH (ref 65–99)

## 2016-03-09 LAB — BASIC METABOLIC PANEL
Anion gap: 8 (ref 5–15)
BUN: 12 mg/dL (ref 6–20)
CALCIUM: 8.1 mg/dL — AB (ref 8.9–10.3)
CHLORIDE: 102 mmol/L (ref 101–111)
CO2: 21 mmol/L — AB (ref 22–32)
CREATININE: 0.93 mg/dL (ref 0.61–1.24)
GFR calc Af Amer: >60 mL/min (ref >60)
GFR calc non Af Amer: >60 mL/min (ref >60)
GLUCOSE: 199 mg/dL — AB (ref 65–99)
Potassium: 3.7 mmol/L (ref 3.5–5.1)
Sodium: 131 mmol/L — ABNORMAL LOW (ref 135–145)

## 2016-03-09 LAB — CBC WITH DIFFERENTIAL/PLATELET
BASOS PCT: 0 %
Basophils Absolute: 0 10*3/uL (ref 0.0–0.1)
EOS PCT: 0 %
Eosinophils Absolute: 0 10*3/uL (ref 0.0–0.7)
HEMATOCRIT: 25.4 % — AB (ref 39.0–52.0)
HEMOGLOBIN: 8.8 g/dL — AB (ref 13.0–17.0)
LYMPHS ABS: 2.1 10*3/uL (ref 0.7–4.0)
Lymphocytes Relative: 8 %
MCH: 29.6 pg (ref 26.0–34.0)
MCHC: 34.6 g/dL (ref 30.0–36.0)
MCV: 85.5 fL (ref 78.0–100.0)
Monocytes Absolute: 2.1 10*3/uL — ABNORMAL HIGH (ref 0.1–1.0)
Monocytes Relative: 8 %
NEUTROS ABS: 22 10*3/uL — AB (ref 1.7–7.7)
Neutrophils Relative %: 84 %
Platelets: 370 10*3/uL (ref 150–400)
RBC: 2.97 MIL/uL — ABNORMAL LOW (ref 4.22–5.81)
RDW: 13.6 % (ref 11.5–15.5)
WBC: 26.2 10*3/uL — AB (ref 4.0–10.5)

## 2016-03-09 SURGERY — INCISION AND DRAINAGE, ABSCESS, PERIRECTAL
Anesthesia: General | Site: Buttocks | Laterality: Right

## 2016-03-09 MED ORDER — ONDANSETRON HCL 4 MG/2ML IJ SOLN
INTRAMUSCULAR | Status: AC
  Filled 2016-03-09: qty 2

## 2016-03-09 MED ORDER — PHENYLEPHRINE 40 MCG/ML (10ML) SYRINGE FOR IV PUSH (FOR BLOOD PRESSURE SUPPORT)
PREFILLED_SYRINGE | INTRAVENOUS | Status: AC
  Filled 2016-03-09: qty 10

## 2016-03-09 MED ORDER — HYDROGEN PEROXIDE 3 % EX SOLN
CUTANEOUS | Status: AC
  Filled 2016-03-09: qty 473

## 2016-03-09 MED ORDER — 0.9 % SODIUM CHLORIDE (POUR BTL) OPTIME
TOPICAL | Status: DC | PRN
  Administered 2016-03-09: 1000 mL

## 2016-03-09 MED ORDER — FENTANYL CITRATE (PF) 100 MCG/2ML IJ SOLN
INTRAMUSCULAR | Status: AC
  Filled 2016-03-09: qty 2

## 2016-03-09 MED ORDER — MEPERIDINE HCL 50 MG/ML IJ SOLN: 6.2500 mg | INTRAMUSCULAR | Status: DC | PRN

## 2016-03-09 MED ORDER — HYDROGEN PEROXIDE 3 % EX SOLN
CUTANEOUS | Status: DC | PRN
Start: 2016-03-09 — End: 2016-03-09
  Administered 2016-03-09: 1 via TOPICAL

## 2016-03-09 MED ORDER — PHENYLEPHRINE HCL 10 MG/ML IJ SOLN
INTRAMUSCULAR | Status: AC
  Filled 2016-03-09: qty 2

## 2016-03-09 MED ORDER — SUCCINYLCHOLINE CHLORIDE 200 MG/10ML IV SOSY
PREFILLED_SYRINGE | INTRAVENOUS | Status: DC | PRN
  Administered 2016-03-09: 100 mg via INTRAVENOUS

## 2016-03-09 MED ORDER — PROPOFOL 10 MG/ML IV BOLUS
INTRAVENOUS | Status: AC
  Filled 2016-03-09: qty 20

## 2016-03-09 MED ORDER — HYDROMORPHONE HCL 1 MG/ML IJ SOLN
0.2500 mg | INTRAMUSCULAR | Status: DC | PRN
  Administered 2016-03-09 (×4): 0.5 mg via INTRAVENOUS

## 2016-03-09 MED ORDER — MIDAZOLAM HCL 2 MG/2ML IJ SOLN
INTRAMUSCULAR | Status: AC
  Filled 2016-03-09: qty 2

## 2016-03-09 MED ORDER — ONDANSETRON HCL 4 MG/2ML IJ SOLN
INTRAMUSCULAR | Status: DC | PRN
  Administered 2016-03-09: 4 mg via INTRAVENOUS

## 2016-03-09 MED ORDER — MIDAZOLAM HCL 5 MG/5ML IJ SOLN
INTRAMUSCULAR | Status: DC | PRN
  Administered 2016-03-09: 0.5 mg via INTRAVENOUS

## 2016-03-09 MED ORDER — HYDROMORPHONE HCL 1 MG/ML IJ SOLN
INTRAMUSCULAR | Status: AC
  Administered 2016-03-09: 0.5 mg via INTRAVENOUS
  Filled 2016-03-09: qty 1

## 2016-03-09 MED ORDER — FENTANYL CITRATE (PF) 100 MCG/2ML IJ SOLN
INTRAMUSCULAR | Status: DC | PRN
  Administered 2016-03-09: 50 ug via INTRAVENOUS
  Administered 2016-03-09 (×2): 25 ug via INTRAVENOUS

## 2016-03-09 MED ORDER — BUPIVACAINE HCL (PF) 0.25 % IJ SOLN
INTRAMUSCULAR | Status: AC
  Filled 2016-03-09: qty 30

## 2016-03-09 MED ORDER — PROMETHAZINE HCL 25 MG/ML IJ SOLN: 6.2500 mg | INTRAMUSCULAR | Status: DC | PRN

## 2016-03-09 MED ORDER — SUCCINYLCHOLINE CHLORIDE 200 MG/10ML IV SOSY
PREFILLED_SYRINGE | INTRAVENOUS | Status: AC
  Filled 2016-03-09: qty 10

## 2016-03-09 MED ORDER — PHENYLEPHRINE 40 MCG/ML (10ML) SYRINGE FOR IV PUSH (FOR BLOOD PRESSURE SUPPORT)
PREFILLED_SYRINGE | INTRAVENOUS | Status: DC | PRN
  Administered 2016-03-09 (×2): 120 ug via INTRAVENOUS
  Administered 2016-03-09: 80 ug via INTRAVENOUS

## 2016-03-09 MED ORDER — PROPOFOL 10 MG/ML IV BOLUS
INTRAVENOUS | Status: DC | PRN
  Administered 2016-03-09: 240 mg via INTRAVENOUS

## 2016-03-09 MED ORDER — INSULIN ASPART 100 UNIT/ML ~~LOC~~ SOLN
0.0000 [IU] | SUBCUTANEOUS | Status: DC
  Administered 2016-03-09: 3 [IU] via SUBCUTANEOUS
  Administered 2016-03-09: 5 [IU] via SUBCUTANEOUS
  Administered 2016-03-10: 8 [IU] via SUBCUTANEOUS
  Administered 2016-03-10 (×2): 3 [IU] via SUBCUTANEOUS

## 2016-03-09 MED ORDER — LIDOCAINE HCL (CARDIAC) 20 MG/ML IV SOLN
INTRAVENOUS | Status: DC | PRN
  Administered 2016-03-09: 100 mg via INTRAVENOUS

## 2016-03-09 MED ORDER — LACTATED RINGERS IV SOLN
INTRAVENOUS | Status: DC
  Administered 2016-03-09: 13:00:00 via INTRAVENOUS

## 2016-03-09 MED ORDER — PHENYLEPHRINE HCL 10 MG/ML IJ SOLN
INTRAVENOUS | Status: DC | PRN
  Administered 2016-03-09: 25 ug/min via INTRAVENOUS

## 2016-03-09 MED ORDER — LIDOCAINE 2% (20 MG/ML) 5 ML SYRINGE
INTRAMUSCULAR | Status: AC
  Filled 2016-03-09: qty 5

## 2016-03-09 MED ORDER — INSULIN GLARGINE 100 UNIT/ML ~~LOC~~ SOLN
30.0000 [IU] | Freq: Every day | SUBCUTANEOUS | Status: DC
  Administered 2016-03-09 – 2016-03-12 (×4): 30 [IU] via SUBCUTANEOUS
  Filled 2016-03-09 (×5): qty 0.3

## 2016-03-09 SURGICAL SUPPLY — 27 items
BLADE SURG SZ20 CARB STEEL (BLADE) ×3 IMPLANT
COVER SURGICAL LIGHT HANDLE (MISCELLANEOUS) ×3 IMPLANT
DRAPE LAPAROTOMY T 98X78 PEDS (DRAPES) ×2 IMPLANT
DRAPE LG THREE QUARTER DISP (DRAPES) ×2 IMPLANT
DRAPE SHEET LG 3/4 BI-LAMINATE (DRAPES) ×2 IMPLANT
DRSG KUZMA FLUFF (GAUZE/BANDAGES/DRESSINGS) ×2 IMPLANT
ELECT PENCIL ROCKER SW 15FT (MISCELLANEOUS) ×3 IMPLANT
GAUZE IODOFORM PACK 1/2 7832 (GAUZE/BANDAGES/DRESSINGS) ×3 IMPLANT
GAUZE SPONGE 4X4 12PLY STRL (GAUZE/BANDAGES/DRESSINGS) ×1 IMPLANT
GAUZE SPONGE 4X4 16PLY XRAY LF (GAUZE/BANDAGES/DRESSINGS) ×3 IMPLANT
GLOVE BIO SURGEON STRL SZ 6.5 (GLOVE) ×1 IMPLANT
GLOVE BIO SURGEONS STRL SZ 6.5 (GLOVE) ×1
GLOVE BIOGEL M 8.0 STRL (GLOVE) ×3 IMPLANT
GLOVE BIOGEL PI IND STRL 7.0 (GLOVE) ×1 IMPLANT
GLOVE BIOGEL PI IND STRL 7.5 (GLOVE) IMPLANT
GLOVE BIOGEL PI INDICATOR 7.0 (GLOVE) ×4
GLOVE BIOGEL PI INDICATOR 7.5 (GLOVE) ×4
GOWN STRL REUS W/ TWL XL LVL3 (GOWN DISPOSABLE) IMPLANT
GOWN STRL REUS W/TWL XL LVL3 (GOWN DISPOSABLE) ×8 IMPLANT
KIT BASIN OR (CUSTOM PROCEDURE TRAY) ×3 IMPLANT
PACK LITHOTOMY IV (CUSTOM PROCEDURE TRAY) ×3 IMPLANT
PAD ABD 8X10 STRL (GAUZE/BANDAGES/DRESSINGS) ×4 IMPLANT
SOL PREP PROV IODINE SCRUB 4OZ (MISCELLANEOUS) ×2 IMPLANT
SPONGE LAP 18X18 X RAY DECT (DISPOSABLE) ×2 IMPLANT
SWAB CULTURE ESWAB REG 1ML (MISCELLANEOUS) ×3 IMPLANT
TOWEL OR 17X26 10 PK STRL BLUE (TOWEL DISPOSABLE) ×3 IMPLANT
YANKAUER SUCT BULB TIP 10FT TU (MISCELLANEOUS) ×6 IMPLANT

## 2016-03-09 NOTE — Progress Notes (Signed)
PT Cancellation Note  Patient Details Name: Danny Fuller MRN: [id number] DOB: Jul 19, 1975   Cancelled Treatment:     Notified that patient is returning to surgery today. Will await reorders for Hydrotherapy if /when indicated.   [name] 03/09/2016, 8:24 AM Tresa Endo PT [phone]

## 2016-03-09 NOTE — Progress Notes (Addendum)
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 He met sepsis criteria on admission, code sepsis was activated and started on IV cefepime for presumed acute pyelonephritis. CT abdomen and pelvis showed right pyelonephritis. Blood and urine cultures were negative but patient failed to improve on IV cefepime and continued to have fevers. Right flank and abdominal pain resolved. On 1/23, complained of acute right buttock pain with drainage. Repeat imaging confirmed Fournier's gangrene, Gen. surgery performed I&D in the OR on 1/23, postop he developed hypotension and was transferred to stepdown unit. Hypotension resolved. On 1/25, more pain, persistent tachycardia, worsening leukocytosis, concern for residual infection and hence CCS plans to take to OR for a relook and I&D.   Assessment & Plan:   Principal Problem:   Necrotizing fasciitis ([place]) Active Problems:   Essential hypertension   Sepsis ([place])   Diabetic polyneuropathy associated with type 2 diabetes mellitus ([place])   Pyelonephritis   1. Sepsis secondary to Fournier's gangrene: As indicated above, initially treated for presumed right-sided acute pyelonephritis based on clinical picture and CT abdomen. Blood cultures 2 from 1/20: Negative to date. Urine culture 1/20: Negative. Wound culture 1/23: Pending. Continued to spike fevers. On 1/23 new complaints of right buttock pain and drainage. CT abdomen and pelvis confirmed Fournier's gangrene. General surgery was consulted and performed I&D in OR on 1/23. Postop developed hypotension and was transferred to  stepdown unit for close monitoring and management. Hypotension resolved with IV fluids. Antibiotics were broadened to IV meropenem, vancomycin and clindamycin. Monitor BMP closely while on vancomycin (prior history of acute kidney injury secondary to vancomycin- updated his nephrologist on 1/25). On 1/25, more pain, persistent tachycardia, worsening leukocytosis, concern for residual infection and hence CCS plans to take to OR for a relook and I&D. Discussed with general surgery.  2. Uncontrolled insulin-dependent type 2 diabetes with polyneuropathy: Increase Lantus 30 units at bedtime and change SSI to every 4 hourly while nothing by mouth (1/25 morning). CBGs ranging in the low 200s. This may be related to stress response/infection. Monitor closely and may need further titration of insulins and or addition of mealtime NovoLog. Hemoglobin A1c: 11.6. Continue Lyrica. Tramadol discontinued. Diabetes coordinator input appreciated. 3. Essential hypertension: Transient postop hypotension 1/23 has resolved. Pharmacy reviewed home medications again on 1/24. He does not take labetalol and was discontinued. Uncontrolled this morning. Continue amlodipine, clonidine, hydralazine and lisinopril. 4. Sinus tachycardia: Due to problem #1 and pain. Treatment as above and monitor. Clonidine as blood pressure tolerates. Also continue IV fluids for additional 24 hours. 5. Anemia: Baseline hemoglobin may be in the 10-11 range. This is dropped to 8.5 which may be related to acute illness, infection and surgical blood loss. Follow CBCs daily and transfuse if hemoglobin <7 g per DL. Stable. 6. Leukocytosis: Secondary to sepsis. Antimicrobial management as above. Follow CBCs.   DVT prophylaxis: Lovenox Code Status: Full Family Communication: None at bedside Disposition Plan: Transfer to stepdown unit on 1/23. Continue management in stepdown unit for now.   Consultants:   General surgery  Procedures:   I&D of  perirectal/perineal abscess by general surgery on 03/07/16  Antimicrobials:  IV cefepime 1/20 > 1/23  IV clindamycin 1/23 >  IV meropenem 1/23 >  IV vancomycin 1/23 >    Subjective: Seen this morning. Complained of appropriate worsening buttock op site pain. Not taking much by mouth. No chest pain or dyspnea reported.   Objective:  Vitals:   03/09/16 0622 03/09/16 0842 03/09/16 0857 03/09/16 0900  BP: (!) 175/85   (!) 170/88  Pulse:    (!) 112  Resp:      Temp:  98.7 F (37.1 C) 98.4 F (36.9 C)   TempSrc:  Axillary Oral   SpO2:    97%  Weight:      Height:        Intake/Output Summary (Last 24 hours) at 03/09/16 1056 Last data filed at 03/09/16 0900  Gross per 24 hour  Intake             3715 ml  Output             2200 ml  Net             1515 ml   Filed Weights   03/04/16 1549 03/07/16 2200  Weight: 108 kg (238 lb) 116 kg (255 lb 11.7 oz)    Examination:  General exam: Pleasant young male lying uncomfortably in left lateral position and in some pain. Dressing apparently were changed short while prior to my visit. Respiratory system: Clear to auscultation. Respiratory effort normal. Cardiovascular system: S1 & S2 heard, RRR. No JVD, murmurs, rubs, gallops or clicks. No pedal edema. Telemetry: Sinus tachycardia in the 110s-120s. Gastrointestinal system: Abdomen is nondistended, soft and nontender. No organomegaly or masses felt. Normal bowel sounds heard. Central nervous system: Alert and oriented. No focal neurological deficits. Extremities: Symmetric 5 x 5 power. Skin: No rashes, lesions or ulcers. Sacral area dressing clean and dry-changed by general surgery prior to my exam. Psychiatry: Judgement and insight appear normal. Mood & affect appropriate.     Data Reviewed: I have personally reviewed following labs and imaging studies  CBC:  Recent Labs Lab 03/05/16 0112 03/06/16 0534 03/07/16 0549 03/07/16 2228 03/08/16 0321 03/09/16 0350  WBC  20.1* 18.5* 23.3* 23.8* 25.1* 26.2*  NEUTROABS 14.9* 14.7* 19.5*  --  21.3* 22.0*  HGB 10.9* 9.6* 9.3* 8.8* 8.5* 8.8*  HCT 30.8* 27.4* 26.5* 25.2* 25.1* 25.4*  MCV 84.8 84.8 85.8 85.7 84.2 85.5  PLT 275 242 278 272 307 009   Basic Metabolic Panel:  Recent Labs Lab 03/06/16 0534 03/07/16 0549 03/07/16 2228 03/08/16 0321 03/09/16 0350  NA 131* 129* 133* 131* 131*  K 4.0 4.7 4.4 4.5 3.7  CL 103 102 106 104 102  CO2 21* 17* 19* 20* 21*  GLUCOSE 291* 315* 180* 235* 199*  BUN _0 CREATININE 1.08 1.22 1.23 1.14 0.93  CALCIUM 8.1* 8.1* 7.9* 8.0* 8.1*   GFR: Estimated Creatinine Clearance: 135.3 mL/min (by C-G formula based on SCr of 0.93 mg/dL). Liver Function Tests:  Recent Labs Lab 03/03/16 2058 03/04/16 1630  AST 14* 18  ALT 13* 15*  ALKPHOS 82 91  BILITOT 0.5 1.2  PROT 7.7 8.1  ALBUMIN 3.8 4.2    Recent Labs Lab 03/04/16 1630  LIPASE 16   No results for input(s): AMMONIA in the last 168 hours. Coagulation Profile: No results for input(s): INR, PROTIME in the last 168 hours. Cardiac Enzymes: No results for input(s): CKTOTAL, CKMB, CKMBINDEX, TROPONINI in the last 168 hours. BNP (last 3 results) No  results for input(s): PROBNP in the last 8760 hours. HbA1C: No results for input(s): HGBA1C in the last 72 hours. CBG:  Recent Labs Lab 03/08/16 0902 03/08/16 1145 03/08/16 1636 03/08/16 2213 03/09/16 0856  GLUCAP 212* 209* 212* 214* 222*   Lipid Profile: No results for input(s): CHOL, HDL, LDLCALC, TRIG, CHOLHDL, LDLDIRECT in the last 72 hours. Thyroid Function Tests: No results for input(s): TSH, T4TOTAL, FREET4, T3FREE, THYROIDAB in the last 72 hours. Anemia Panel: No results for input(s): VITAMINB12, FOLATE, FERRITIN, TIBC, IRON, RETICCTPCT in the last 72 hours.  Sepsis Labs:  Recent Labs Lab 03/04/16 2141 03/04/16 2334 03/05/16 0112  03/07/16 0549 03/07/16 2228 03/08/16 0321 03/09/16 0350  PROCALCITON  --   --  0.65  --   --    --   --   --   WBC  --   --  20.1*  < > 23.3* 23.8* 25.1* 26.2*  LATICACIDVEN 2.83* 1.15  --   --   --   --   --   --   < > = values in this interval not displayed.  Recent Results (from the past 240 hour(s))  Urine culture     Status: None   Collection Time: 03/04/16  9:50 PM  Result Value Ref Range Status   Specimen Description URINE, CLEAN CATCH  Final   Special Requests NONE  Final   Culture   Final    NO GROWTH Performed at [facility], [address] [place], [place] [address]    Report Status 03/06/2016 FINAL  Final  Blood Culture (routine x 2)     Status: None (Preliminary result)   Collection Time: 03/04/16 11:18 PM  Result Value Ref Range Status   Specimen Description BLOOD RIGHT ANTECUBITAL  Final   Special Requests BOTTLES DRAWN AEROBIC AND ANAEROBIC 6CC  Final   Culture   Final    NO GROWTH 3 DAYS Performed at [facility], [place] [address], [place], [place] [address]    Report Status PENDING  Incomplete  Blood Culture (routine x 2)     Status: None (Preliminary result)   Collection Time: 03/04/16 11:18 PM  Result Value Ref Range Status   Specimen Description BLOOD RIGHT HAND  Final   Special Requests BOTTLES DRAWN AEROBIC AND ANAEROBIC 6 CC  Final   Culture   Final    NO GROWTH 3 DAYS Performed at [facility], [place] [address], [place], [place] [address]    Report Status PENDING  Incomplete  Anaerobic culture     Status: None (Preliminary result)   Collection Time: 03/07/16  7:07 PM  Result Value Ref Range Status   Specimen Description WOUND  Final   Special Requests   Final    RIGHT BUTTOCKS Performed at [facility], [place] [address], [place], [place] [address]    Culture PENDING  Incomplete   Report Status PENDING  Incomplete  Aerobic Culture (superficial specimen)     Status: None (Preliminary result)   Collection Time: 03/07/16  7:07 PM  Result Value Ref Range Status   Specimen Description WOUND  Final   Special Requests RIGHT  BUTTOCKS  Final   Gram Stain   Final    RARE WBC PRESENT, PREDOMINANTLY PMN ABUNDANT GRAM POSITIVE RODS MODERATE GRAM POSITIVE COCCI IN CLUSTERS MODERATE GRAM NEGATIVE RODS Performed at [facility], [place] [address] [place], [place] [address]    Culture PENDING  Incomplete  Report Status PENDING  Incomplete  MRSA PCR Screening     Status: None   Collection Time: 03/07/16 10:00 PM  Result Value Ref Range Status   MRSA by PCR NEGATIVE NEGATIVE Final    Comment:        The GeneXpert MRSA Assay (FDA approved for NASAL specimens only), is one component of a comprehensive MRSA colonization surveillance program. It is not intended to diagnose MRSA infection nor to guide or monitor treatment for MRSA infections.          Radiology Studies: Ct Abdomen Pelvis W Contrast  Result Date: 03/07/2016 CLINICAL DATA:  Elevated white blood cell count, new pain in the right gluteal region, evaluate for abscess EXAM: CT ABDOMEN AND PELVIS WITH CONTRAST TECHNIQUE: Multidetector CT imaging of the abdomen and pelvis was performed using the standard protocol following bolus administration of intravenous contrast. CONTRAST:  112m ISOVUE-300 IOPAMIDOL (ISOVUE-300) INJECTION 61%, 318mISOVUE-300 IOPAMIDOL (ISOVUE-300) INJECTION 61% COMPARISON:  CT abdomen pelvis 03/04/2016 FINDINGS: Lower chest: The lung bases are clear. Hepatobiliary: The liver is somewhat low in attenuation which may indicate fatty infiltration. A single small fully defined low-attenuation structures present in the right lobe on image 24 probably representing hemangioma. No calcified gallstones are seen. Pancreas: The pancreas is normal in size and the pancreatic duct is not dilated. Spleen: The spleen is unremarkable. Adrenals/Urinary Tract: The adrenal glands are stable in contour. The kidneys enhance with no change in the small cysts in the lower pole of the left kidney. No hydronephrosis is noted. The ureters are normal in  caliber. There is more perinephric strandiness particularly over the lower pole of the right kidney which is nonspecific but could indicate mild inflammation. The urinary bladder is not well distended but no abnormality is seen. Stomach/Bowel: The stomach is not well distended but no abnormality is seen. No small bowel distention is noted. The colon is largely decompressed with a moderate amount of feces present. The terminal ileum and the appendix are unremarkable. Vascular/Lymphatic: The abdominal aorta is normal in caliber with moderate abdominal aortic atherosclerosis present. No adenopathy is seen. Borderline right groin nodes are unchanged compared to the prior study. Reproductive: The prostate is small. Other: There is inflammation and soft tissue gas present within the right medial buttocks primarily in the fat planes extending into the perineum near the base of the scrotum. These findings are consistent with necrotizing soft tissue infection, often termed Fourniere's gangrene. Musculoskeletal: The lumbar vertebrae are in normal alignment. No compression deformity is seen. Intervertebral disc spaces appear normal. IMPRESSION: 1. 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 Critical Value/emergent results were called by telephone at the time of interpretation on 03/07/2016 at 3:54 pm to Dr. JeDessa Phi who verbally acknowledged these results. 2. Suspect fatty infiltration of the liver. 3. Slightly more perinephric strandiness than on the prior exam may indicate infection but no obstruction is evident. 4. Moderate abdominal aortic atherosclerosis for age. Electronically Signed   By: PaIvar Drape.D.   On: 03/07/2016 15:56        Scheduled Meds: . amLODipine  10 mg Oral Daily  . clindamycin (CLEOCIN) IV  900 mg Intravenous Q8H  . cloNIDine  0.1 mg Oral BID  . enoxaparin (LOVENOX) injection  50 mg Subcutaneous QHS    . hydrALAZINE  25 mg Oral Q8H  . insulin aspart  0-15 Units Subcutaneous Q4H  . insulin glargine  30 Units  Subcutaneous Q2200  . lisinopril  20 mg Oral Daily  . meropenem (MERREM) IV  1 g Intravenous Q8H  . pregabalin  75 mg Oral BID  . sodium chloride flush  3 mL Intravenous Q12H  . vancomycin  750 mg Intravenous Q12H   Continuous Infusions: . sodium chloride 125 mL/hr at 03/09/16 0900     LOS: 5 days        [facility], MD [facility] Pager [phone]  If 7PM-7AM, please contact night-coverage [email or web address] Password [facility] 03/09/2016, 10:56 AM

## 2016-03-09 NOTE — Transfer of Care (Signed)
Immediate Anesthesia Transfer of Care Note  Patient: Danny Fuller  Procedure(s) Performed: Procedure(s) with comments: EXAM UNDER ANESTHESIA, IRRIGATION AND DEBRIDEMENT PERIRECTAL ABSCESS (Right) - Open, betadine packed wound  Patient Location: PACU  Anesthesia Type:General  Level of Consciousness: awake, alert , oriented and patient cooperative  Airway & Oxygen Therapy: Patient Spontanous Breathing and Patient connected to face mask oxygen  Post-op Assessment: Report given to RN, Post -op Vital signs reviewed and stable and Patient moving all extremities  Post vital signs: Reviewed and stable  Last Vitals:  Vitals:   03/09/16 1200 03/09/16 1304  BP:  (!) 145/84  Pulse: (!) 113 (!) 116  Resp: 17   Temp:  (!) 38.6 C    Last Pain:  Vitals:   03/09/16 1200  TempSrc:   PainSc: 4       Patients Stated Pain Goal: 4 ([id number])  Complications: No apparent anesthesia complications

## 2016-03-09 NOTE — Anesthesia Preprocedure Evaluation (Addendum)
Anesthesia Evaluation  Patient identified by MRN, date of birth, ID band Patient awake    Reviewed: Allergy & Precautions, NPO status , Patient's Chart, lab work & pertinent test results, reviewed documented beta blocker date and time   History of Anesthesia Complications Negative for: history of anesthetic complications  Airway Mallampati: II  TM Distance: >3 FB Neck ROM: Full    Dental  (+) Missing, Poor Dentition, Dental Advisory Given, Chipped   Pulmonary former smoker,    breath sounds clear to auscultation       Cardiovascular hypertension, Pt. on medications and Pt. on home beta blockers (-) angina Rhythm:Regular Rate:Tachycardia     Neuro/Psych  Headaches,    GI/Hepatic Neg liver ROS, GERD  Medicated and Controlled,N/v today with infection   Endo/Other  diabetes, Type 2, Insulin DependentMorbid obesity  Renal/GU negative Renal ROS     Musculoskeletal   Abdominal (+) + obese,   Peds  Hematology  (+) Blood dyscrasia (Hb 9.3), anemia ,   Anesthesia Other Findings   Reproductive/Obstetrics                             Anesthesia Physical  Anesthesia Plan  ASA: III  Anesthesia Plan: General   Post-op Pain Management:    Induction: Intravenous  Airway Management Planned: LMA  Additional Equipment:   Intra-op Plan:   Post-operative Plan: Extubation in OR  Informed Consent: I have reviewed the patients History and Physical, chart, labs and discussed the procedure including the risks, benefits and alternatives for the proposed anesthesia with the patient or authorized representative who has indicated his/her understanding and acceptance.   Dental advisory given  Plan Discussed with: CRNA  Anesthesia Plan Comments:         Anesthesia Quick Evaluation

## 2016-03-09 NOTE — Progress Notes (Signed)
Inpatient Diabetes Program Recommendations  AACE/ADA: New Consensus Statement on Inpatient Glycemic Control (2015)  Target Ranges:  Prepandial:   less than 140 mg/dL      Peak postprandial:   less than 180 mg/dL (1-2 hours)      Critically ill patients:  140 - 180 mg/dL   Lab Results  Component Value Date   GLUCAP 222 (H) 03/09/2016   HGBA1C 11.6 (H) 03/06/2016   Results for Danny Fuller, Danny Fuller (MRN [id number]) as of 03/09/2016 10:47  Ref. Range 03/08/2016 09:02 03/08/2016 11:45 03/08/2016 16:36 03/08/2016 22:13 03/09/2016 08:56  Glucose-Capillary Latest Ref Range: 65 - 99 mg/dL 212 (H) 209 (H) 212 (H) 214 (H) 222 (H)   Review of Glycemic Control Needs insulin adjustment.  Inpatient Diabetes Program Recommendations:    Increase Lantus to 30 units QHS Change Novolog to Q4H while NPO.  Continue to monitor. Thank you. Lorenda Peck, RD, LDN, CDE Inpatient Diabetes Coordinator [phone]

## 2016-03-09 NOTE — Anesthesia Procedure Notes (Signed)
Procedure Name: Intubation Date/Time: 03/09/2016 1:35 PM Performed by: Carleene Cooper A Pre-anesthesia Checklist: Patient identified, Emergency Drugs available, Suction available and Patient being monitored Patient Re-evaluated:Patient Re-evaluated prior to inductionOxygen Delivery Method: Circle system utilized Preoxygenation: Pre-oxygenation with 100% oxygen Intubation Type: IV induction Ventilation: Oral airway inserted - appropriate to patient size and Mask ventilation without difficulty Laryngoscope Size: Mac and 4 Grade View: Grade I Tube type: Oral Tube size: 7.5 mm Number of attempts: 1 Airway Equipment and Method: Stylet and Oral airway Placement Confirmation: ETT inserted through vocal cords under direct vision,  positive ETCO2 and breath sounds checked- equal and bilateral Secured at: 21 cm Tube secured with: Tape Dental Injury: Teeth and Oropharynx as per pre-operative assessment

## 2016-03-09 NOTE — Op Note (Signed)
Surgeon: Kaylyn Lim, MD, FACS  Asst:  none  Anes:  general  Preop Dx: Right perineal infection  Postop Dx: Right perineal infection with residual or new purulence (no gas noted)  Procedure: Exam under anesthesia with opening on the right near base of scrotum;  Manual digital exploration with evacuation of bloody tan pus Location Surgery: [facility] Complications: none  EBL:   15 cc  Drains: None:  Packed with iodophor gauze in the new opening and betadine kerlex in the residual cavity  Description of Procedure:  The patient was taken to OR 2 .  After anesthesia was administered and the patient was prepped a timeout was performed.  In the dorsal lithotomy position I began with palpation and found some induration lateral to the large perineal opening.  I used my finger and a Kelly clamp to explore the indurated areas getting some gray-tan foul smelling drainage.  I explored anteriorly to the base of the scrotum and went wherever I found induration.  The planes were still intact and this did not dissect like a necrotizing fasciitis.  It was however a deep rooted abscess.  I irrigated with cavities with hydrogen peroxide and packed the anterior opening 2 cm in length with iodophor gauze and the large cavity with betadine gauze.    The patient tolerated the procedure well and was taken to the PACU in stable condition.     Matt B. Hassell Done, [place], [facility], [place]

## 2016-03-09 NOTE — Progress Notes (Signed)
2 Days Post-Op  Subjective: Febrile, earlier and having allot of pain still. He remains tachycardic and not taking much in.   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  General appearance: alert, cooperative, no distress and very uncomfortable, moves in bed well, cooperative. Skin: Skin color, texture, turgor normal. No rashes or lesions or I do not see any new sites.  we will take dressing down with Hydrotherapy. and examine site more throughtly.    Lab Results:   Recent Labs  03/08/16 0321 03/09/16 0350  WBC 25.1* 26.2*  HGB 8.5* 8.8*  HCT 25.1* 25.4*  PLT 307 370    BMET  Recent Labs  03/08/16 0321 03/09/16 0350  NA 131* 131*  K 4.5 3.7  CL 104 102  CO2 20* 21*  GLUCOSE 235* 199*  BUN 17 12  CREATININE 1.14 0.93  CALCIUM 8.0* 8.1*   PT/INR No results for input(s): LABPROT, INR in the last 72 hours.   Recent Labs Lab 03/03/16 2058 03/04/16 1630  AST 14* 18  ALT 13* 15*  ALKPHOS 82 91  BILITOT 0.5 1.2  PROT 7.7 8.1  ALBUMIN 3.8 4.2     Lipase     Component Value Date/Time   LIPASE 16 03/04/2016 1630     Studies/Results: Ct Abdomen Pelvis W Contrast  Result Date: 03/07/2016 CLINICAL DATA:  Elevated white blood cell count, new pain in the right gluteal region, evaluate for abscess EXAM: CT ABDOMEN AND  PELVIS WITH CONTRAST TECHNIQUE: Multidetector CT imaging of the abdomen and pelvis was performed using the standard protocol following bolus administration of intravenous contrast. CONTRAST:  181mL ISOVUE-300 IOPAMIDOL (ISOVUE-300) INJECTION 61%, 24mL ISOVUE-300 IOPAMIDOL (ISOVUE-300) INJECTION 61% COMPARISON:  CT abdomen pelvis 03/04/2016 FINDINGS: Lower chest: The lung bases are clear. Hepatobiliary: The liver is somewhat low in attenuation which may indicate fatty infiltration. A single small fully defined low-attenuation structures present in the right lobe on image 24 probably representing hemangioma. No calcified gallstones are seen. Pancreas: The pancreas is normal in size and the pancreatic duct is not dilated. Spleen: The spleen is unremarkable. Adrenals/Urinary Tract: The adrenal glands are stable in contour. The kidneys enhance with no change in the small cysts in the lower pole of the left kidney. No hydronephrosis is noted. The ureters are normal in caliber. There is more perinephric strandiness particularly over the lower pole of the right kidney which is nonspecific but could indicate mild inflammation. The urinary bladder is not well distended but no abnormality is seen. Stomach/Bowel: The stomach is not well distended but no abnormality is seen. No small bowel distention is noted. The colon is largely decompressed with a moderate amount of feces present. The terminal ileum and the appendix are unremarkable. Vascular/Lymphatic: The abdominal aorta is normal in caliber with moderate abdominal aortic atherosclerosis present. No adenopathy is seen. Borderline right groin nodes are unchanged  compared to the prior study. Reproductive: The prostate is small. Other: There is inflammation and soft tissue gas present within the right medial buttocks primarily in the fat planes extending into the perineum near the base of the scrotum. These findings are consistent with necrotizing soft tissue infection, often  termed Fourniere's gangrene. Musculoskeletal: The lumbar vertebrae are in normal alignment. No compression deformity is seen. Intervertebral disc spaces appear normal. IMPRESSION: 1. 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 Critical Value/emergent results were called by telephone at the time of interpretation on 03/07/2016 at 3:54 pm to Dr. Dessa Phi , who verbally acknowledged these results. 2. Suspect fatty infiltration of the liver. 3. Slightly more perinephric strandiness than on the prior exam may indicate infection but no obstruction is evident. 4. Moderate abdominal aortic atherosclerosis for age. Electronically Signed   By: Ivar Drape M.D.   On: 03/07/2016 15:56    Medications: . amLODipine  10 mg Oral Daily  . clindamycin (CLEOCIN) IV  900 mg Intravenous Q8H  . cloNIDine  0.1 mg Oral BID  . enoxaparin (LOVENOX) injection  50 mg Subcutaneous QHS  . hydrALAZINE  25 mg Oral Q8H  . insulin aspart  0-20 Units Subcutaneous TID WC  . insulin aspart  0-5 Units Subcutaneous QHS  . insulin glargine  25 Units Subcutaneous Q2200  . lisinopril  20 mg Oral Daily  . meropenem (MERREM) IV  1 g Intravenous Q8H  . pregabalin  75 mg Oral BID  . sodium chloride flush  3 mL Intravenous Q12H  . vancomycin  750 mg Intravenous Q12H   . sodium chloride 125 mL/hr at 03/08/16 1700    Assessment/Plan Right perianal/perneal abscess/necrotic tissue IRRIGATION AND DEBRIDEMENT PERIRECTAL/perinealABSCESS, 02/26/16, Dr. Lucia Gaskins  AODM Hypertension  GERD Headaches  FEN:  IV fluids/clears ID:  CeFEPIme IJ 03/04/16 =>> day 5 / Clindamycin & Vancomycin 03/07/16 =>>  Day 3 DVT:  SCD/Lovenox    Plan:  Continue current Rx, multiple organisms on culture/gram stain.  No culture results yet.  If his WBC continues to rise and he continues to have fever. Reviewed with Dr. Hassell Done and he will take him back to the OR for Exam under  anesthesia this AM.     LOS: 5 days    Abigayle Wilinski 03/09/2016 [phone]

## 2016-03-09 NOTE — Anesthesia Postprocedure Evaluation (Signed)
Anesthesia Post Note  Patient: Danny Fuller  Procedure(s) Performed: Procedure(s) (LRB): EXAM UNDER ANESTHESIA, IRRIGATION AND DEBRIDEMENT PERIRECTAL ABSCESS (Right)  Patient location during evaluation: PACU Anesthesia Type: General Level of consciousness: sedated and patient cooperative Pain management: pain level controlled Vital Signs Assessment: post-procedure vital signs reviewed and stable Respiratory status: spontaneous breathing Cardiovascular status: stable Anesthetic complications: no       Last Vitals:  Vitals:   03/09/16 1700 03/09/16 1800  BP:    Pulse: (!) 114 97  Resp: 15 15  Temp:      Last Pain:  Vitals:   03/09/16 1600  TempSrc:   PainSc: [place]

## 2016-03-10 LAB — CULTURE, BLOOD (ROUTINE X 2)
CULTURE: NO GROWTH
Culture: NO GROWTH

## 2016-03-10 LAB — BASIC METABOLIC PANEL
Anion gap: 7 (ref 5–15)
BUN: 11 mg/dL (ref 6–20)
CHLORIDE: 102 mmol/L (ref 101–111)
CO2: 21 mmol/L — ABNORMAL LOW (ref 22–32)
Calcium: 7.6 mg/dL — ABNORMAL LOW (ref 8.9–10.3)
Creatinine, Ser: 0.99 mg/dL (ref 0.61–1.24)
GFR calc Af Amer: >60 mL/min (ref >60)
GFR calc non Af Amer: >60 mL/min (ref >60)
GLUCOSE: 268 mg/dL — AB (ref 65–99)
POTASSIUM: 3.7 mmol/L (ref 3.5–5.1)
Sodium: 130 mmol/L — ABNORMAL LOW (ref 135–145)

## 2016-03-10 LAB — AEROBIC CULTURE  (SUPERFICIAL SPECIMEN): CULTURE: NO GROWTH

## 2016-03-10 LAB — GLUCOSE, CAPILLARY
GLUCOSE-CAPILLARY: 170 mg/dL — AB (ref 65–99)
GLUCOSE-CAPILLARY: 185 mg/dL — AB (ref 65–99)
Glucose-Capillary: 264 mg/dL — ABNORMAL HIGH (ref 65–99)
Glucose-Capillary: 183 mg/dL — ABNORMAL HIGH (ref 65–99)
Glucose-Capillary: 222 mg/dL — ABNORMAL HIGH (ref 65–99)
Glucose-Capillary: 155 mg/dL — ABNORMAL HIGH (ref 65–99)

## 2016-03-10 LAB — CBC WITH DIFFERENTIAL/PLATELET
BASOS PCT: 0 %
Basophils Absolute: 0 10*3/uL (ref 0.0–0.1)
EOS PCT: 0 %
Eosinophils Absolute: 0 10*3/uL (ref 0.0–0.7)
HEMATOCRIT: 22.2 % — AB (ref 39.0–52.0)
HEMOGLOBIN: 7.8 g/dL — AB (ref 13.0–17.0)
LYMPHS ABS: 2.5 10*3/uL (ref 0.7–4.0)
Lymphocytes Relative: 10 %
MCH: 30 pg (ref 26.0–34.0)
MCHC: 35.1 g/dL (ref 30.0–36.0)
MCV: 85.4 fL (ref 78.0–100.0)
MONOS PCT: 7 %
Monocytes Absolute: 1.8 10*3/uL — ABNORMAL HIGH (ref 0.1–1.0)
NEUTROS ABS: 20.9 10*3/uL — AB (ref 1.7–7.7)
Neutrophils Relative %: 83 %
Platelets: 393 10*3/uL (ref 150–400)
RBC: 2.6 MIL/uL — ABNORMAL LOW (ref 4.22–5.81)
RDW: 13.6 % (ref 11.5–15.5)
WBC: 25.2 10*3/uL — ABNORMAL HIGH (ref 4.0–10.5)

## 2016-03-10 LAB — AEROBIC CULTURE W GRAM STAIN (SUPERFICIAL SPECIMEN)

## 2016-03-10 LAB — VANCOMYCIN, TROUGH: VANCOMYCIN TR: 8 ug/mL — AB (ref 15–20)

## 2016-03-10 MED ORDER — INSULIN ASPART 100 UNIT/ML ~~LOC~~ SOLN
0.0000 [IU] | Freq: Three times a day (TID) | SUBCUTANEOUS | Status: DC
  Administered 2016-03-10: 5 [IU] via SUBCUTANEOUS
  Administered 2016-03-10 – 2016-03-11 (×3): 3 [IU] via SUBCUTANEOUS
  Administered 2016-03-11: 2 [IU] via SUBCUTANEOUS
  Administered 2016-03-12 (×2): 3 [IU] via SUBCUTANEOUS
  Administered 2016-03-12 – 2016-03-13 (×2): 5 [IU] via SUBCUTANEOUS
  Administered 2016-03-13: 3 [IU] via SUBCUTANEOUS
  Administered 2016-03-13: 2 [IU] via SUBCUTANEOUS
  Administered 2016-03-14: 3 [IU] via SUBCUTANEOUS
  Administered 2016-03-14: 2 [IU] via SUBCUTANEOUS
  Administered 2016-03-14 – 2016-03-15 (×3): 3 [IU] via SUBCUTANEOUS

## 2016-03-10 MED ORDER — INSULIN ASPART 100 UNIT/ML ~~LOC~~ SOLN
0.0000 [IU] | Freq: Every day | SUBCUTANEOUS | Status: DC
  Administered 2016-03-13: 2 [IU] via SUBCUTANEOUS

## 2016-03-10 MED ORDER — VANCOMYCIN HCL IN DEXTROSE 1-5 GM/200ML-% IV SOLN
1000.0000 mg | Freq: Three times a day (TID) | INTRAVENOUS | Status: DC
  Administered 2016-03-10 – 2016-03-14 (×12): 1000 mg via INTRAVENOUS
  Filled 2016-03-10 (×13): qty 200

## 2016-03-10 NOTE — Evaluation (Signed)
Physical Therapy Evaluation Patient Details Name: Danny Fuller MRN: [id number] DOB: 1976/02/11 Today's Date: 03/10/2016   History of Present Illness  41 yo male admitted with perirectal abcess, S/P Iand D . H/O DM  Clinical Impression  The patient is very motivated to ambulate. Today , first time to ambulate, felt flushed and weak. Pt admitted with above diagnosis. Pt currently with functional limitations due to the deficits listed below (see PT Problem List).  Pt will benefit from skilled PT to increase their independence and safety with mobility to allow discharge to the venue listed below.     Should continue to progress to return to independence.  Follow Up Recommendations No PT follow up    Equipment Recommendations  None recommended by PT    Recommendations for Other Services       Precautions / Restrictions Precautions Precaution Comments: monitor BP , HR until gets more walking  in.      Mobility  Bed Mobility Overal bed mobility: Needs Assistance Bed Mobility: Rolling;Sidelying to Sit;Sit to Sidelying Rolling: Supervision Sidelying to sit: Min assist     Sit to sidelying: Min assist General bed mobility comments: assist with trunk to sit and with legs to sidelying  Transfers Overall transfer level: Needs assistance Equipment used: Rolling walker (2 wheeled) Transfers: Sit to/from Stand Sit to Stand: Min guard;+2 safety/equipment;From elevated surface         General transfer comment: steady assit to stand  Ambulation/Gait Ambulation/Gait assistance: Min assist;+2 safety/equipment Ambulation Distance (Feet): 100 Feet Assistive device: Rolling walker (2 wheeled) Gait Pattern/deviations: Step-through pattern     General Gait Details: slow. Complains of dizziness at intervals. BP126/60, HR 115. SATS 100% RA  Stairs            Wheelchair Mobility    Modified Rankin (Stroke Patients Only)       Balance Overall balance assessment: Needs  assistance Sitting-balance support: No upper extremity supported;Feet supported Sitting balance-Leahy Scale: Good     Standing balance support: Bilateral upper extremity supported;During functional activity Standing balance-Leahy Scale: Fair                               Pertinent Vitals/Pain Pain Assessment: 0-10 Pain Score: 4  Pain Location: buttock to sit Pain Descriptors / Indicators: Burning Pain Intervention(s): Monitored during session;Premedicated before session;Repositioned    Home Living Family/patient expects to be discharged to:: Private residence Living Arrangements: Spouse/significant other Available Help at Discharge: Family Type of Home: House Home Access: Stairs to enter   Technical brewer of Steps: 2 Home Layout: One level Home Equipment: Cane - single point      Prior Function Level of Independence: Independent;Independent with assistive device(s)               Hand Dominance        Extremity/Trunk Assessment   Upper Extremity Assessment Upper Extremity Assessment: Overall WFL for tasks assessed    Lower Extremity Assessment Lower Extremity Assessment: Generalized weakness    Cervical / Trunk Assessment Cervical / Trunk Assessment: Normal  Communication   Communication: No difficulties  Cognition Arousal/Alertness: Awake/alert Behavior During Therapy: WFL for tasks assessed/performed Overall Cognitive Status: Within Functional Limits for tasks assessed                      General Comments      Exercises     Assessment/Plan    PT Assessment  Patient needs continued PT services  PT Problem List Decreased strength;Decreased activity tolerance;Decreased mobility          PT Treatment Interventions Gait training;DME instruction;Functional mobility training;Therapeutic activities;Patient/family education    PT Goals (Current goals can be found in the Care Plan section)  Acute Rehab PT Goals Patient  Stated Goal: to get stronger. PT Goal Formulation: With patient Time For Goal Achievement: 03/24/16 Potential to Achieve Goals: Good    Frequency Min 3X/week   Barriers to discharge        Co-evaluation               End of Session Equipment Utilized During Treatment: Gait belt Activity Tolerance: Patient tolerated treatment well Patient left: in bed;with call bell/phone within reach;with bed alarm set Nurse Communication: Mobility status         Time: 1450-1525 PT Time Calculation (min) (ACUTE ONLY): 35 min   Charges:   PT Evaluation $PT Eval Low Complexity: 1 Procedure PT Treatments $Gait Training: 8-22 mins   PT G Codes:        [name] 03/10/2016, 5:08 PM Tresa Endo PT [phone]

## 2016-03-10 NOTE — Progress Notes (Signed)
1 Day Post-Op  Subjective: Pt feels better, BP and heart rate is better.  Objective: Vital signs in last 24 hours: Temp:  [98 F (36.7 C)-102.1 F (38.9 C)] 98 F (36.7 C) (01/26 0400) Pulse Rate:  [97-121] 98 (01/26 0400) Resp:  [12-22] 14 (01/26 0400) BP: (109-170)/(57-95) 109/57 (01/26 0400) SpO2:  [94 %-100 %] 99 % (01/26 0400) Last BM Date: 03/04/16 240 PO 4100 IV fluids 1350 urine Tm 101.1@ MN  BP and HR are better, sats good on R/A WBC 25.2, H/H down further   Intake/Output from previous day: 01/25 0701 - 01/26 0700 In: 4465 [P.O.:240; I.V.:3375; IV Piggyback:850] Out: [id number] [Urine:1350; Blood:15] Intake/Output this shift: No intake/output data recorded.  General appearance: alert, cooperative and no distress Skin: Open site 4 x 5 x 4 cm, clean with some nonviable tissue, but most of it is muscle and it is clean.  It does undermine some cephad about 3 cm.  I did not see the iodoform gauze he talked about and it must have come out earlier with dressing change.    Lab Results:   Recent Labs  03/09/16 0350 03/10/16 0344  WBC 26.2* 25.2*  HGB 8.8* 7.8*  HCT 25.4* 22.2*  PLT 370 393    BMET  Recent Labs  03/09/16 0350 03/10/16 0344  NA 131* 130*  K 3.7 3.7  CL 102 102  CO2 21* 21*  GLUCOSE 199* 268*  BUN 12 11  CREATININE 0.93 0.99  CALCIUM 8.1* 7.6*   PT/INR No results for input(s): LABPROT, INR in the last 72 hours.   Recent Labs Lab 03/03/16 2058 03/04/16 1630  AST 14* 18  ALT 13* 15*  ALKPHOS 82 91  BILITOT 0.5 1.2  PROT 7.7 8.1  ALBUMIN 3.8 4.2     Lipase     Component Value Date/Time   LIPASE 16 03/04/2016 1630     Studies/Results: No results found.  Medications: . amLODipine  10 mg Oral Daily  . clindamycin (CLEOCIN) IV  900 mg Intravenous Q8H  . cloNIDine  0.1 mg Oral BID  . enoxaparin (LOVENOX) injection  50 mg Subcutaneous QHS  . hydrALAZINE  25 mg Oral Q8H  . insulin aspart  0-15 Units Subcutaneous Q4H  . insulin  glargine  30 Units Subcutaneous Q2200  . lisinopril  20 mg Oral Daily  . meropenem (MERREM) IV  1 g Intravenous Q8H  . pregabalin  75 mg Oral BID  . sodium chloride flush  3 mL Intravenous Q12H  . vancomycin  750 mg Intravenous Q12H   . sodium chloride 125 mL/hr at 03/10/16 0400    Assessment/Plan Right perianal/perneal abscess/necrotic tissue IRRIGATION AND DEBRIDEMENT PERIRECTAL/perinealABSCESS, 02/26/16, Dr. Lucia Gaskins   Exam under anesthesia with opening on the right near base of scrotum;  Manual digital exploration with evacuation of bloody tan pus, 03/09/16, Dr. Johnathan Hausen AODM Hypertension  GERD Headaches  FEN: IV fluids/carb mod ID: [id number] IJ 03/04/16 =>>day 5 / Clindamycin &Vancomycin 03/07/16 =>>Day 4 DVT: SCD/Lovenox   Plan:  Take dressing down and hydrotherapy, will look at wounds and repack what we can.   I am going to make him NPO for AM tomorrow in case we need to take him back for another look.  Recheck labs in AM. Dressing changes bid.    LOS: 6 days    Caylan Schifano 03/10/2016 [phone]

## 2016-03-10 NOTE — Progress Notes (Addendum)
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 He met sepsis criteria on admission, code sepsis was activated and started on IV cefepime for presumed acute pyelonephritis. CT abdomen and pelvis showed right pyelonephritis. Blood and urine cultures were negative but patient failed to improve on IV cefepime and continued to have fevers. Right flank and abdominal pain resolved. On 1/23, complained of acute right buttock pain with drainage. Repeat imaging confirmed Fournier's gangrene, Gen. surgery performed I&D in the OR on 1/23, postop he developed hypotension and was transferred to stepdown unit. Hypotension resolved. On 1/25, more pain, persistent tachycardia, worsening leukocytosis, concern for residual infection and hence CCS the patient back to OR on 1/25 and underwent manual digital exploration with evacuation of bloody tan pus.   Assessment & Plan:   Principal Problem:   Necrotizing fasciitis ([place]) Active Problems:   Essential hypertension   Sepsis ([place])   Diabetic polyneuropathy associated with type 2 diabetes mellitus ([place])   Pyelonephritis   1. Sepsis secondary to Fournier's gangrene: As indicated above, initially treated for presumed right-sided acute pyelonephritis based on clinical picture and CT abdomen. Blood cultures 2 from 1/20: Negative. Urine culture 1/20: Negative. Wound culture 1/23: No growth to date. Repeat blood cultures 1/25: Negative to date. Continued to spike fevers. On 1/23 new complaints of right buttock pain and drainage. CT abdomen and pelvis confirmed Fournier's gangrene.  General surgery was consulted and performed I&D in OR on 1/23. Postop developed hypotension and was transferred to stepdown unit for close monitoring and management. Hypotension resolved with IV fluids. Antibiotics were broadened to IV meropenem, vancomycin and clindamycin. Monitor BMP closely while on vancomycin (prior history of acute kidney injury secondary to vancomycin- updated his nephrologist on 1/25). On 1/25, more pain, persistent tachycardia, worsening leukocytosis, concern for residual infection and hence CCS the patient back to OR on 1/25 and underwent manual digital exploration with evacuation of bloody tan pus. Discussed with general surgery. Wound care per surgery. Started hydrotherapy. Made NPO after midnight tonight in case surgical exploration as needed tomorrow. Clinically improved. 2. 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 CBGs are better than yesterday. Uncontrolled DM related to stress response/infection. Monitor closely and may need further titration of insulins and or addition of mealtime NovoLog. Hemoglobin A1c: 11.6. Continue Lyrica. Tramadol discontinued. Diabetes coordinator input appreciated. 3. Essential hypertension: Transient postop hypotension 1/23 has resolved. Pharmacy reviewed home medications again on 1/24. He does not take labetalol and was discontinued. Uncontrolled this morning. Continue amlodipine, clonidine, hydralazine and lisinopril. Reasonable inpatient control. 4. Sinus tachycardia: Due to problem #1 and pain. Treatment as above and monitor. Clonidine as blood pressure tolerates. Improved. 5. Anemia: Baseline hemoglobin may be in the 10-11 range. Hemoglobin was in the mid 8 g per DL range for the last couple of days but has dropped to 7.8 on 1/26 >likely secondary to acute illness/infection and some degree of surgical blood loss. Follow CBCs daily and transfuse if hemoglobin <7 g per  DL.  6. Leukocytosis: Secondary to sepsis. Management as above. Follow CBCs daily.   DVT prophylaxis: Lovenox Code  Status: Full Family Communication: None at bedside. I called and left a message for patient's spouse to call back so that I can update her on his care. Disposition Plan: Transferred to stepdown unit on 1/23. Continue management in stepdown unit for now.   Consultants:   General surgery  Procedures:   I&D of perirectal/perineal abscess by general surgery on 03/07/16  On 1/25: Exam under anesthesia with opening on the right near base of scrotum;  Manual digital exploration with evacuation of bloody tan pus  Antimicrobials:   IV cefepime 1/20 > 1/23  IV clindamycin 1/23 >  IV meropenem 1/23 >  IV vancomycin 1/23 >    Subjective: States that he feels better. Did not complain of much pain. Was seen prior to dressing change. As per RN, no acute issues.  Objective:  Vitals:   03/10/16 0800 03/10/16 0824 03/10/16 0924 03/10/16 0925  BP:  127/81 (!) 144/76 (!) 144/76  Pulse: (!) 120     Resp: 19 17    Temp: 98.7 F (37.1 C)     TempSrc: Oral     SpO2: 99%     Weight:      Height:        Intake/Output Summary (Last 24 hours) at 03/10/16 1106 Last data filed at 03/10/16 7591  Gross per 24 hour  Intake             4390 ml  Output             1465 ml  Net             2925 ml   Filed Weights   03/04/16 1549 03/07/16 2200  Weight: 108 kg (238 lb) 116 kg (255 lb 11.7 oz)    Examination:  General exam: Pleasant young male lying comfortably in left lateral position. Looks better than he did yesterday morning.  Respiratory system: Clear to auscultation. Respiratory effort normal. Cardiovascular system: S1 & S2 heard, RRR. No JVD, murmurs, rubs, gallops or clicks. No pedal edema. Telemetry: Sinus tachycardia in the 100's. Gastrointestinal system: Abdomen is nondistended, soft and nontender. No organomegaly or masses felt. Normal bowel sounds heard. Central  nervous system: Alert and oriented. No focal neurological deficits. Extremities: Symmetric 5 x 5 power. Skin: No rashes, lesions or ulcers. Sacral area dressing clean and dry-changed by general surgery prior to my exam. Psychiatry: Judgement and insight appear normal. Mood & affect appropriate.     Data Reviewed: I have personally reviewed following labs and imaging studies  CBC:  Recent Labs Lab 03/06/16 0534 03/07/16 0549 03/07/16 2228 03/08/16 0321 03/09/16 0350 03/10/16 0344  WBC 18.5* 23.3* 23.8* 25.1* 26.2* 25.2*  NEUTROABS 14.7* 19.5*  --  21.3* 22.0* 20.9*  HGB 9.6* 9.3* 8.8* 8.5* 8.8* 7.8*  HCT 27.4* 26.5* 25.2* 25.1* 25.4* 22.2*  MCV 84.8 85.8 85.7 84.2 85.5 85.4  PLT 242 278 272 307 370 638   Basic Metabolic Panel:  Recent Labs Lab 03/07/16 0549 03/07/16 2228 03/08/16 0321 03/09/16 0350 03/10/16 0344  NA 129* 133* 131* 131* 130*  K 4.7 4.4 4.5 3.7 3.7  CL 102 106 104 102 102  CO2 17* 19* 20* 21* 21*  GLUCOSE 315* 180* 235* 199* 268*  BUN 16 15 17 12 11   CREATININE 1.22 1.23 1.14 0.93 0.99  CALCIUM 8.1* 7.9* 8.0* 8.1* 7.6*   GFR: Estimated Creatinine Clearance: 127.1 mL/min (by C-G formula based on SCr of 0.99 mg/dL). Liver Function Tests:  Recent Labs Lab 03/03/16  2058 03/04/16 1630  AST 14* 18  ALT 13* 15*  ALKPHOS 82 91  BILITOT 0.5 1.2  PROT 7.7 8.1  ALBUMIN 3.8 4.2    Recent Labs Lab 03/04/16 1630  LIPASE 16   No results for input(s): AMMONIA in the last 168 hours. Coagulation Profile: No results for input(s): INR, PROTIME in the last 168 hours. Cardiac Enzymes: No results for input(s): CKTOTAL, CKMB, CKMBINDEX, TROPONINI in the last 168 hours. BNP (last 3 results) No results for input(s): PROBNP in the last 8760 hours. HbA1C: No results for input(s): HGBA1C in the last 72 hours. CBG:  Recent Labs Lab 03/09/16 1614 03/09/16 1948 03/10/16 0034 03/10/16 0352 03/10/16 0754  GLUCAP 188* 236* 185* 264* 183*   Lipid  Profile: No results for input(s): CHOL, HDL, LDLCALC, TRIG, CHOLHDL, LDLDIRECT in the last 72 hours. Thyroid Function Tests: No results for input(s): TSH, T4TOTAL, FREET4, T3FREE, THYROIDAB in the last 72 hours. Anemia Panel: No results for input(s): VITAMINB12, FOLATE, FERRITIN, TIBC, IRON, RETICCTPCT in the last 72 hours.  Sepsis Labs:  Recent Labs Lab 03/04/16 2141 03/04/16 2334 03/05/16 0112  03/07/16 2228 03/08/16 0321 03/09/16 0350 03/10/16 0344  PROCALCITON  --   --  0.65  --   --   --   --   --   WBC  --   --  20.1*  < > 23.8* 25.1* 26.2* 25.2*  LATICACIDVEN 2.83* 1.15  --   --   --   --   --   --   < > = values in this interval not displayed.  Recent Results (from the past 240 hour(s))  Urine culture     Status: None   Collection Time: 03/04/16  9:50 PM  Result Value Ref Range Status   Specimen Description URINE, CLEAN CATCH  Final   Special Requests NONE  Final   Culture   Final    NO GROWTH Performed at [facility], [address], [place], [place] [address]    Report Status 03/06/2016 FINAL  Final  Blood Culture (routine x 2)     Status: None   Collection Time: 03/04/16 11:18 PM  Result Value Ref Range Status   Specimen Description BLOOD RIGHT ANTECUBITAL  Final   Special Requests BOTTLES DRAWN AEROBIC AND ANAEROBIC 6CC  Final   Culture   Final    NO GROWTH 5 DAYS Performed at [facility], [place] [address], [place], [place] [address]    Report Status 03/10/2016 FINAL  Final  Blood Culture (routine x 2)     Status: None   Collection Time: 03/04/16 11:18 PM  Result Value Ref Range Status   Specimen Description BLOOD RIGHT HAND  Final   Special Requests BOTTLES DRAWN AEROBIC AND ANAEROBIC 6 CC  Final   Culture   Final    NO GROWTH 5 DAYS Performed at [facility], [place] [address], [place], [place] [address]    Report Status 03/10/2016 FINAL  Final  Anaerobic culture     Status: None (Preliminary result)   Collection Time: 03/07/16  7:07  PM  Result Value Ref Range Status   Specimen Description WOUND  Final   Special Requests   Final    RIGHT BUTTOCKS Performed at [facility], [place] [address], [place], [place] [address]    Culture PENDING  Incomplete   Report Status PENDING  Incomplete  Aerobic Culture (superficial specimen)     Status: None (Preliminary  result)   Collection Time: 03/07/16  7:07 PM  Result Value Ref Range Status   Specimen Description WOUND  Final   Special Requests RIGHT BUTTOCKS  Final   Gram Stain   Final    RARE WBC PRESENT, PREDOMINANTLY PMN ABUNDANT GRAM POSITIVE RODS MODERATE GRAM POSITIVE COCCI IN CLUSTERS MODERATE GRAM NEGATIVE RODS    Culture   Final    NO GROWTH 1 DAY Performed at [facility], [place] [address], [place], [place] [address]    Report Status PENDING  Incomplete  MRSA PCR Screening     Status: None   Collection Time: 03/07/16 10:00 PM  Result Value Ref Range Status   MRSA by PCR NEGATIVE NEGATIVE Final    Comment:        The GeneXpert MRSA Assay (FDA approved for NASAL specimens only), is one component of a comprehensive MRSA colonization surveillance program. It is not intended to diagnose MRSA infection nor to guide or monitor treatment for MRSA infections.   Culture, blood (Routine X 2) w Reflex to ID Panel     Status: None (Preliminary result)   Collection Time: 03/09/16 12:01 PM  Result Value Ref Range Status   Specimen Description BLOOD RIGHT ARM  Final   Special Requests BOTTLES DRAWN AEROBIC AND ANAEROBIC 5CC  Final   Culture   Final    NO GROWTH < 24 HOURS Performed at [facility], [place] [address], [place], [place] [address]    Report Status PENDING  Incomplete  Culture, blood (Routine X 2) w Reflex to ID Panel     Status: None (Preliminary result)   Collection Time: 03/09/16 12:02 PM  Result Value Ref Range Status   Specimen Description BLOOD RIGHT HAND  Final   Special Requests IN PEDIATRIC BOTTLE 3CC  Final   Culture   Final     NO GROWTH < 24 HOURS Performed at [facility], [place] [address], [place], [place] [address]    Report Status PENDING  Incomplete         Radiology Studies: No results found.      Scheduled Meds: . amLODipine  10 mg Oral Daily  . clindamycin (CLEOCIN) IV  900 mg Intravenous Q8H  . cloNIDine  0.1 mg Oral BID  . enoxaparin (LOVENOX) injection  50 mg Subcutaneous QHS  . hydrALAZINE  25 mg Oral Q8H  . insulin aspart  0-15 Units Subcutaneous Q4H  . insulin glargine  30 Units Subcutaneous Q2200  . lisinopril  20 mg Oral Daily  . meropenem (MERREM) IV  1 g Intravenous Q8H  . pregabalin  75 mg Oral BID  . sodium chloride flush  3 mL Intravenous Q12H  . vancomycin  750 mg Intravenous Q12H   Continuous Infusions: . sodium chloride 125 mL/hr at 03/10/16 0900     LOS: 6 days        [facility], MD [facility] Pager [phone]  If 7PM-7AM, please contact night-coverage [email or web address] Password [email or web address] 03/10/2016, 11:06 AM

## 2016-03-10 NOTE — Progress Notes (Signed)
Pharmacy Antibiotic Note  Danny Fuller is a 41 y.o. 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  He is s/p I&D 1/23 and digital exploration on 1/25 with drainage of bloody, tan pus.  Pharmacy has been consulted for meropenem and vancomycin dosing. Patient has h/o AKI, presumably d/t vancomycin (appears was on 1500mg  IV q8h).  Data suggests increase nephrotoxicity with daily doses > 4gm .    Day #7 abx Day #4 clindamycin/meropenem/vancomcyin  Renal: Scr stable (h/o AKI) - also on ACE-I  CBC unchanged (sl improvement)  Tm 101.1 last night  Plan:  Vancomycin trough low, increase to 1gm IV q8h  Recheck trough with am labs on Sunday 1/28  Follow SCr at least q48h  Continue meropenem 1gm IV q8h  Height: 5\' 9"  (175.3 cm) Weight: 255 lb 11.7 oz (116 kg) IBW/kg (Calculated) : 70.7  Temp (24hrs), Avg:99.9 F (37.7 C), Min:98 F (36.7 C), Max:102.1 F (38.9 C)   Recent Labs Lab 03/04/16 2141 03/04/16 2334  03/07/16 0549 03/07/16 2228 03/08/16 0321 03/09/16 0350 03/10/16 0344 03/10/16 1253  WBC  --   --   < > 23.3* 23.8* 25.1* 26.2* 25.2*  --   CREATININE  --   --   < > 1.22 1.23 1.14 0.93 0.99  --   LATICACIDVEN 2.83* 1.15  --   --   --   --   --   --   --   VANCOTROUGH  --   --   --   --   --   --   --   --  8*  < > = values in this interval not displayed.  Estimated Creatinine Clearance: 127.1 mL/min (by C-G formula based on SCr of 0.99 mg/dL).    No Known Allergies   Antimicrobials this admission:  Ceftriaxone 1/20 x1 Cefepime 1/21 >> 1/23 1/23 Vanc >> 1/23 meropenem >> 1/23 clindamycin >>  Dose adjustments this admission:  1/26 1300 VT = 8 mcg/ml on 750mg  IV q12h (prior to 6th dose), increase dose to 1gm q8h   Microbiology results:  1/20 BCx x2: ngtd 1/20 UCx: ngf 1/23 MRSA PCR neg 1/23 buttock wound cx (aerobic): NG 1/23 buttock wound cx (anaerobic): IP   Thank you for allowing pharmacy to be a part of this patient's  care.  Doreene Eland, PharmD, BCPS.   Pager: [phone] 03/10/2016 2:30 PM

## 2016-03-10 NOTE — Progress Notes (Addendum)
   03/10/16 0900 Hydrotherapy Evaluation. 800-900  Subjective Assessment  Subjective Is it going to hurt?  Patient and Family Stated Goals to heal this wound  Date of Onset 03/04/16  Prior Treatments I and D   Evaluation and Treatment  Evaluation and Treatment Procedures Explained to Patient/Family Yes  Evaluation and Treatment Procedures agreed to  Wound / Incision (Open or Dehisced) 03/10/16 Incision - Open Perineum Right;Medial open surgical wound perirectal on the right  buttock  Date First Assessed/Time First Assessed: 03/10/16 0815   Wound Type: Incision - Open  Location: Perineum  Location Orientation: Right;Medial  Wound Description (Comments): open surgical wound perirectal on the right  buttock  Present on Admission: No  Dressing Type ABD;Gauze (Comment);Moist to moist;Mesh briefs  Dressing Changed New  Dressing Status Clean;Dry;Intact  Dressing Change Frequency Twice a day  Site / Wound Assessment Clean;Granulation tissue;Painful;Pink  % Wound base Red or Granulating 60% (unable to visualize  deeper wound base)  % Wound base Yellow/Fibrinous Exudate 40%  Peri-wound Assessment Intact;Edema  Wound Length (cm) 4.5 cm  Wound Width (cm) 5 cm  Wound Depth (cm) 4 cm  Tunneling (cm) 5 (at 5 oclock)  Margins Unattached edges (unapproximated)  Drainage Amount Moderate  Drainage Description Serosanguineous  Non-staged Wound Description Not applicable  Treatment Cleansed;Debridement (Selective);Hydrotherapy (Pulse lavage);Packing (Saline gauze)  Hydrotherapy  Pulsed lavage therapy - wound location perirectal on the right  Pulsed Lavage with Suction (psi) 8 psi  Pulsed Lavage with Suction - Normal Saline Used 1000 mL  Pulsed Lavage Tip Tip with splash shield  Selective Debridement  Selective Debridement - Location perirectal wound  Selective Debridement - Tools Used Forceps;Scissors  Selective Debridement - Tissue Removed fibrinous tissue  Wound Therapy -  Assess/Plan/Recommendations  Wound Therapy - Clinical Statement The patient tolerated the  treatment well with premedication via IV. the wound is pink, hardened to palpate edema peri wound to the left and inferior.  The patient is able to position self on abdomen for optimum treatment. The patient wll benefit from further PLS and dressing changes while in acute care. Educated patient on goals of treatment and PLS technique/treatment being rendered.  The patient is appreciative. .The patient will benefit from mobility daily to decrease effects of bedrest. He tolerated standing at the bedside this visit.  Wound Therapy - Functional Problem List the patient is independent in bed mobility. will benefit from ambulation frequently.  Factors Delaying/Impairing Wound Healing Diabetes Mellitus  Hydrotherapy Plan Debridement;Dressing change;Pulsatile lavage with suction  Wound Therapy - Frequency 6X / week  Wound Therapy - Current Recommendations PT  Wound Therapy - Follow Up Recommendations Home health RN  Wound Plan PLS 6 x week  Wound Therapy Goals - Improve the function of patient's integumentary system by progressing the wound(s) through the phases of wound healing by:  Decrease Necrotic Tissue to 0  Decrease Necrotic Tissue - Progress Goal set today  Increase Granulation Tissue to 100  Increase Granulation Tissue - Progress Goal set today  Decrease Length/Width/Depth by (cm) 2 cm  Decrease Length/Width/Depth - Progress Goal set today  Improve Drainage Characteristics Min;Serous  Improve Drainage Characteristics - Progress Goal set today  Patient/Family will be able to  reposition self  Patient/Family Instruction Goal - Progress Goal set today  Goals/treatment plan/discharge plan were made with and agreed upon by patient/family Yes  Time For Goal Achievement 2 weeks  Wound Therapy - Potential for Goals Excellent  Tresa Endo PT [phone]

## 2016-03-11 LAB — GLUCOSE, CAPILLARY
Glucose-Capillary: 188 mg/dL — ABNORMAL HIGH (ref 65–99)
Glucose-Capillary: 148 mg/dL — ABNORMAL HIGH (ref 65–99)
Glucose-Capillary: 186 mg/dL — ABNORMAL HIGH (ref 65–99)
Glucose-Capillary: 159 mg/dL — ABNORMAL HIGH (ref 65–99)

## 2016-03-11 LAB — BASIC METABOLIC PANEL
ANION GAP: 6 (ref 5–15)
BUN: 9 mg/dL (ref 6–20)
CALCIUM: 7.6 mg/dL — AB (ref 8.9–10.3)
CO2: 24 mmol/L (ref 22–32)
Chloride: 99 mmol/L — ABNORMAL LOW (ref 101–111)
Creatinine, Ser: 0.7 mg/dL (ref 0.61–1.24)
GLUCOSE: 193 mg/dL — AB (ref 65–99)
Potassium: 3.4 mmol/L — ABNORMAL LOW (ref 3.5–5.1)
SODIUM: 129 mmol/L — AB (ref 135–145)

## 2016-03-11 LAB — CBC
HCT: 21.9 % — ABNORMAL LOW (ref 39.0–52.0)
Hemoglobin: 7.7 g/dL — ABNORMAL LOW (ref 13.0–17.0)
MCH: 30.1 pg (ref 26.0–34.0)
MCHC: 35.2 g/dL (ref 30.0–36.0)
MCV: 85.5 fL (ref 78.0–100.0)
PLATELETS: 447 10*3/uL — AB (ref 150–400)
RBC: 2.56 MIL/uL — ABNORMAL LOW (ref 4.22–5.81)
RDW: 13.4 % (ref 11.5–15.5)
WBC: 22.3 10*3/uL — AB (ref 4.0–10.5)

## 2016-03-11 MED ORDER — POTASSIUM CHLORIDE CRYS ER 20 MEQ PO TBCR
30.0000 meq | EXTENDED_RELEASE_TABLET | Freq: Once | ORAL | Status: AC
  Administered 2016-03-11: 30 meq via ORAL
  Filled 2016-03-11: qty 1

## 2016-03-11 NOTE — Progress Notes (Signed)
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 He met sepsis criteria on admission, code sepsis was activated and started on IV cefepime for presumed acute pyelonephritis. CT abdomen and pelvis showed right pyelonephritis. Blood and urine cultures were negative but patient failed to improve on IV cefepime and continued to have fevers. Right flank and abdominal pain resolved. On 1/23, complained of acute right buttock pain with drainage. Repeat imaging confirmed Fournier's gangrene, Gen. surgery performed I&D in the OR on 1/23, postop he developed hypotension and was transferred to stepdown unit. Hypotension resolved. On 1/25, more pain, persistent tachycardia, worsening leukocytosis, concern for residual infection and hence CCS the patient back to OR on 1/25 and underwent manual digital exploration with evacuation of bloody tan pus.   Assessment & Plan:   Principal Problem:   Necrotizing fasciitis ([place]) Active Problems:   Essential hypertension   Sepsis ([place])   Diabetic polyneuropathy associated with type 2 diabetes mellitus ([place])   Pyelonephritis   1. Sepsis secondary to Fournier's gangrene: As indicated above, initially treated for presumed right-sided acute pyelonephritis based on clinical picture and CT abdomen. Blood cultures 2 from 1/20: Negative. Urine culture 1/20: Negative. Wound culture 1/23: No growth to date. Repeat blood cultures 1/25: Negative to date. Continued to spike fevers. On 1/23 new complaints of right buttock pain and drainage. CT abdomen and pelvis confirmed Fournier's gangrene.  General surgery was consulted and performed I&D in OR on 1/23. Postop developed hypotension and was transferred to stepdown unit for close monitoring and management. Hypotension resolved with IV fluids. Antibiotics were broadened to IV meropenem, vancomycin and clindamycin. Monitor BMP closely while on vancomycin (prior history of acute kidney injury secondary to vancomycin- updated his nephrologist on 1/25). On 1/25, more pain, persistent tachycardia, worsening leukocytosis, concern for residual infection and hence CCS the patient back to OR on 1/25 and underwent manual digital exploration with evacuation of bloody tan pus. Wound care per surgery. Continue hydrotherapy. Slowly improving >less pain, no high fevers since 1/25 and leukocytosis improving. As per general surgery, continue current management. 2. 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 CBGs are better . Uncontrolled DM related to stress response/infection. Monitor closely and may need further titration of insulins and or addition of mealtime NovoLog. Hemoglobin A1c: 11.6. Continue Lyrica. Tramadol discontinued. Diabetes coordinator input appreciated. 3. Essential hypertension: Transient postop hypotension 1/23 has resolved. Pharmacy reviewed home medications again on 1/24. He does not take labetalol and was discontinued. Continue amlodipine, clonidine, hydralazine and lisinopril. Fluctuating and mildly uncontrolled at times. Continue current regimen without change. 4. Sinus tachycardia: Due to problem #1 and pain. Improved. 5. Anemia: Baseline hemoglobin may be in the 10-11 range. Hemoglobin was in the mid 8 g per DL range for the last couple of days but has dropped to 7.8>7.7 on 1/27 >likely secondary to acute illness/infection and some degree of surgical blood loss. Follow CBCs daily and transfuse if hemoglobin <7 g per DL.  6. Leukocytosis: Secondary to sepsis.  Management as above. Follow CBCs daily. Slightly better. 7. Hypokalemia: replaced. Follow BMP in am. 8. Hyponatremia:  Stable. Likely multifactorial secondary to? SIADH and hyperglycemia. Clinically euvolemic. Follow BMP periodically.   DVT prophylaxis: Lovenox Code Status: Full Family Communication: None at bedside. I called on 03/10/16 and left a message for patient's spouse to call back so that I can update her on his care. Disposition Plan: Transferred to stepdown unit on 1/23. Continue management in stepdown unit for now.   Consultants:   General surgery  Procedures:   I&D of perirectal/perineal abscess by general surgery on 03/07/16  On 1/25: Exam under anesthesia with opening on the right near base of scrotum;  Manual digital exploration with evacuation of bloody tan pus  Antimicrobials:   IV cefepime 1/20 > 1/23  IV clindamycin 1/23 >  IV meropenem 1/23 >  IV vancomycin 1/23 >    Subjective: States that he did not sleep well last night d/t dressing change performed ~ 1 am. Feels tired but denies any other complaints.  Objective:  Vitals:   03/11/16 0350 03/11/16 0623 03/11/16 0800 03/11/16 1000  BP:  (!) 147/91  (!) 154/78  Pulse:  (!) 101  (!) 101  Resp:  14  15  Temp: 98.2 F (36.8 C)  98.4 F (36.9 C)   TempSrc: Oral  Oral   SpO2:  100%  99%  Weight:      Height:        Intake/Output Summary (Last 24 hours) at 03/11/16 1116 Last data filed at 03/11/16 1100  Gross per 24 hour  Intake             4215 ml  Output             3150 ml  Net             1065 ml   Filed Weights   03/04/16 1549 03/07/16 2200  Weight: 108 kg (238 lb) 116 kg (255 lb 11.7 oz)    Examination:  General exam: Pleasant young male lying comfortably supine in bed. Does not look septic or toxic. Respiratory system: Clear to auscultation. Respiratory effort normal. Cardiovascular system: S1 & S2 heard, RRR. No JVD, murmurs, rubs, gallops or clicks. No pedal edema. Telemetry:  SR-Sinus tachycardia in the 100's. Gastrointestinal system: Abdomen is nondistended, soft and nontender. No organomegaly or masses felt. Normal bowel sounds heard. Central nervous system: Alert and oriented. No focal neurological deficits. Extremities: Symmetric 5 x 5 power. Skin: No rashes, lesions or ulcers. Sacral area dressing clean and dry. Psychiatry: Judgement and insight appear normal. Mood & affect appropriate.     Data Reviewed: I have personally reviewed following labs and imaging studies  CBC:  Recent Labs Lab 03/06/16 0534 03/07/16 0549 03/07/16 2228 03/08/16 0321 03/09/16 0350 03/10/16 0344 03/11/16 0342  WBC 18.5* 23.3* 23.8* 25.1* 26.2* 25.2* 22.3*  NEUTROABS 14.7* 19.5*  --  21.3* 22.0* 20.9*  --   HGB 9.6* 9.3* 8.8* 8.5* 8.8* 7.8* 7.7*  HCT 27.4* 26.5* 25.2* 25.1* 25.4* 22.2* 21.9*  MCV 84.8 85.8 85.7 84.2 85.5 85.4 85.5  PLT 242 278 272 307 370 393 540*   Basic Metabolic Panel:  Recent Labs Lab 03/07/16 2228 03/08/16 0321 03/09/16 0350 03/10/16 0344 03/11/16 0342  NA 133* 131* 131* 130* 129*  K 4.4 4.5 3.7 3.7 3.4*  CL 106 104 102 102 99*  CO2 19* 20* 21* 21* 24  GLUCOSE 180* 235* 199* 268* 193*  BUN _0 CREATININE 1.23 1.14 0.93 0.99 0.70  CALCIUM 7.9* 8.0* 8.1* 7.6* 7.6*  GFR: Estimated Creatinine Clearance: 157.3 mL/min (by C-G formula based on SCr of 0.7 mg/dL). Liver Function Tests:  Recent Labs Lab 03/04/16 1630  AST 18  ALT 15*  ALKPHOS 91  BILITOT 1.2  PROT 8.1  ALBUMIN 4.2    Recent Labs Lab 03/04/16 1630  LIPASE 16   No results for input(s): AMMONIA in the last 168 hours. Coagulation Profile: No results for input(s): INR, PROTIME in the last 168 hours. Cardiac Enzymes: No results for input(s): CKTOTAL, CKMB, CKMBINDEX, TROPONINI in the last 168 hours. BNP (last 3 results) No results for input(s): PROBNP in the last 8760 hours. HbA1C: No results for input(s): HGBA1C in the last 72 hours. CBG:  Recent  Labs Lab 03/10/16 0754 03/10/16 1120 03/10/16 1645 03/10/16 2106 03/11/16 0732  GLUCAP 183* 170* 222* 155* 188*   Lipid Profile: No results for input(s): CHOL, HDL, LDLCALC, TRIG, CHOLHDL, LDLDIRECT in the last 72 hours. Thyroid Function Tests: No results for input(s): TSH, T4TOTAL, FREET4, T3FREE, THYROIDAB in the last 72 hours. Anemia Panel: No results for input(s): VITAMINB12, FOLATE, FERRITIN, TIBC, IRON, RETICCTPCT in the last 72 hours.  Sepsis Labs:  Recent Labs Lab 03/04/16 2141 03/04/16 2334 03/05/16 0112  03/08/16 0321 03/09/16 0350 03/10/16 0344 03/11/16 0342  PROCALCITON  --   --  0.65  --   --   --   --   --   WBC  --   --  20.1*  < > 25.1* 26.2* 25.2* 22.3*  LATICACIDVEN 2.83* 1.15  --   --   --   --   --   --   < > = values in this interval not displayed.  Recent Results (from the past 240 hour(s))  Urine culture     Status: None   Collection Time: 03/04/16  9:50 PM  Result Value Ref Range Status   Specimen Description URINE, CLEAN CATCH  Final   Special Requests NONE  Final   Culture   Final    NO GROWTH Performed at [facility], [address] [place], [place] [address]    Report Status 03/06/2016 FINAL  Final  Blood Culture (routine x 2)     Status: None   Collection Time: 03/04/16 11:18 PM  Result Value Ref Range Status   Specimen Description BLOOD RIGHT ANTECUBITAL  Final   Special Requests BOTTLES DRAWN AEROBIC AND ANAEROBIC 6CC  Final   Culture   Final    NO GROWTH 5 DAYS Performed at [facility], [place] [address] [place], [place] [address]    Report Status 03/10/2016 FINAL  Final  Blood Culture (routine x 2)     Status: None   Collection Time: 03/04/16 11:18 PM  Result Value Ref Range Status   Specimen Description BLOOD RIGHT HAND  Final   Special Requests BOTTLES DRAWN AEROBIC AND ANAEROBIC 6 CC  Final   Culture   Final    NO GROWTH 5 DAYS Performed at [facility], [place] [address] [place], [place] [address]     Report Status 03/10/2016 FINAL  Final  Anaerobic culture     Status: None (Preliminary result)   Collection Time: 03/07/16  7:07 PM  Result Value Ref Range Status   Specimen Description WOUND  Final   Special Requests RIGHT BUTTOCKS  Final   Culture   Final    HOLDING FOR POSSIBLE ANAEROBE Performed at [facility], [address], [place], [place] [address]  Report Status PENDING  Incomplete  Aerobic Culture (superficial specimen)     Status: None   Collection Time: 03/07/16  7:07 PM  Result Value Ref Range Status   Specimen Description WOUND  Final   Special Requests RIGHT BUTTOCKS  Final   Gram Stain   Final    RARE WBC PRESENT, PREDOMINANTLY PMN ABUNDANT GRAM POSITIVE RODS MODERATE GRAM POSITIVE COCCI IN CLUSTERS MODERATE GRAM NEGATIVE RODS    Culture   Final    NO GROWTH 2 DAYS Performed at [facility], [place] [address], [place], [place] [address]    Report Status 03/10/2016 FINAL  Final  MRSA PCR Screening     Status: None   Collection Time: 03/07/16 10:00 PM  Result Value Ref Range Status   MRSA by PCR NEGATIVE NEGATIVE Final    Comment:        The GeneXpert MRSA Assay (FDA approved for NASAL specimens only), is one component of a comprehensive MRSA colonization surveillance program. It is not intended to diagnose MRSA infection nor to guide or monitor treatment for MRSA infections.   Culture, blood (Routine X 2) w Reflex to ID Panel     Status: None (Preliminary result)   Collection Time: 03/09/16 12:01 PM  Result Value Ref Range Status   Specimen Description BLOOD RIGHT ARM  Final   Special Requests BOTTLES DRAWN AEROBIC AND ANAEROBIC 5CC  Final   Culture   Final    NO GROWTH 2 DAYS Performed at [facility], [place] [address] [place], [place] [address]    Report Status PENDING  Incomplete  Culture, blood (Routine X 2) w Reflex to ID Panel     Status: None (Preliminary result)   Collection Time: 03/09/16 12:02 PM  Result Value Ref Range  Status   Specimen Description BLOOD RIGHT HAND  Final   Special Requests IN PEDIATRIC BOTTLE 3CC  Final   Culture   Final    NO GROWTH 2 DAYS Performed at [facility], [place] [address], [place], [place] [address]    Report Status PENDING  Incomplete         Radiology Studies: No results found.      Scheduled Meds: . amLODipine  10 mg Oral Daily  . clindamycin (CLEOCIN) IV  900 mg Intravenous Q8H  . cloNIDine  0.1 mg Oral BID  . enoxaparin (LOVENOX) injection  50 mg Subcutaneous QHS  . hydrALAZINE  25 mg Oral Q8H  . insulin aspart  0-15 Units Subcutaneous TID WC  . insulin aspart  0-5 Units Subcutaneous QHS  . insulin glargine  30 Units Subcutaneous Q2200  . lisinopril  20 mg Oral Daily  . meropenem (MERREM) IV  1 g Intravenous Q8H  . pregabalin  75 mg Oral BID  . sodium chloride flush  3 mL Intravenous Q12H  . vancomycin  1,000 mg Intravenous Q8H   Continuous Infusions: . sodium chloride 125 mL/hr at 03/10/16 1900     LOS: 7 days        [facility], MD [facility] Pager [phone]  If 7PM-7AM, please contact night-coverage [email or web address] Password [facility] 03/11/2016, 11:16 AM

## 2016-03-11 NOTE — Progress Notes (Addendum)
PT HYDRO TREATMENT NOTE  03/11/16 1000  Subjective Assessment  Subjective I am hurting  Patient and Family Stated Goals to heal this wound  Date of Onset 03/04/16  Prior Treatments I and D x 2  Evaluation and Treatment  Evaluation and Treatment Procedures Explained to Patient/Family Yes  Evaluation and Treatment Procedures agreed to  Wound / Incision (Open or Dehisced) 03/10/16 Incision - Open Perineum Right;Medial open surgical wound perirectal on the right  buttock  Date First Assessed/Time First Assessed: 03/10/16 0815   Wound Type: Incision - Open  Location: Perineum  Location Orientation: Right;Medial  Wound Description (Comments): open surgical wound perirectal on the right  buttock  Present on Admission: No  Dressing Type ABD;Gauze (Comment);Moist to moist;Mesh briefs  Dressing Changed Changed  Site / Wound Assessment Clean;Granulation tissue;Painful;Pink;Yellow  % Wound base Red or Granulating 60% (unable to visualize wound base)  % Wound base Yellow/Fibrinous Exudate 40%  Peri-wound Assessment Intact;Edema  Wound Width (cm) (see eval)  Drainage Amount Moderate  Drainage Description Serosanguineous;No odor  Non-staged Wound Description Not applicable  Treatment Cleansed;Debridement (Selective);Hydrotherapy (Pulse lavage);Packing (Saline gauze)  Hydrotherapy  Pulsed lavage therapy - wound location perirectal on the right  Pulsed Lavage with Suction (psi) 8 psi  Pulsed Lavage with Suction - Normal Saline Used 1000 mL  Pulsed Lavage Tip Tip with splash shield  Selective Debridement  Selective Debridement - Location perirectal wound  Selective Debridement - Tools Used Forceps;Scissors  Selective Debridement - Tissue Removed fibrinous tissue  Wound Therapy - Assess/Plan/Recommendations  Wound Therapy - Clinical Statement pt wound mostly granulating from what we are able to visualize; deepest tunnel ~5-6cm center of wound; mild peri-wound induration seems improved today; will  continue to follow; pt needs to continue to mobilize as well; will benefit from continued pls lavage  Wound Therapy - Functional Problem List the patient is independent in bed mobility. will benefit from ambulation frequently.  Factors Delaying/Impairing Wound Healing Diabetes Mellitus  Hydrotherapy Plan Debridement;Dressing change;Pulsatile lavage with suction  Wound Therapy - Frequency 6X / week  Wound Therapy - Current Recommendations PT  Wound Therapy - Follow Up Recommendations Home health RN  Wound Plan PLS 6 x week  Wound Therapy Goals - Improve the function of patient's integumentary system by progressing the wound(s) through the phases of wound healing by:  Decrease Necrotic Tissue to 0  Decrease Necrotic Tissue - Progress Progressing toward goal  Increase Granulation Tissue to 100  Increase Granulation Tissue - Progress Progressing toward goal  Time For Goal Achievement 2 weeks  Wound Therapy - Potential for Goals Excellent

## 2016-03-11 NOTE — Progress Notes (Signed)
Physical Therapy Treatment Patient Details Name: Danny Fuller MRN: [id number] DOB: 1975-06-30 Today's Date: 03/11/2016    History of Present Illness 41 yo male admitted with perirectal abcess, S/P Iand D . H/O DM    PT Comments    Pt progressign well, incr gait tolerance/distance today; reports feeling better overall  Follow Up Recommendations  No PT follow up     Equipment Recommendations  None recommended by PT    Recommendations for Other Services       Precautions / Restrictions Precautions Precaution Comments: VSS today; HR max 110 Restrictions Weight Bearing Restrictions: No    Mobility  Bed Mobility Overal bed mobility: Needs Assistance Bed Mobility: Rolling;Sidelying to Sit Rolling: Supervision Sidelying to sit: Min guard;Supervision       General bed mobility comments: able to come to sit with min/guard for safety and management of lines;  transitioned from standing to prone wtih supervision for back to bed to control pain   Transfers Overall transfer level: Needs assistance Equipment used: Rolling walker (2 wheeled) Transfers: Sit to/from Stand Sit to Stand: Min guard;+2 safety/equipment         General transfer comment: min/guard for safety, line management  Ambulation/Gait Ambulation/Gait assistance: Min assist;+2 safety/equipment;Min guard Ambulation Distance (Feet): 160 Feet Assistive device: Rolling walker (2 wheeled) Gait Pattern/deviations: Step-through pattern     General Gait Details: improved gait speed today, able to amb ~ 5' without AD; min/guarding and +2 for safety d/t dizziness episodes yesterday   Stairs            Wheelchair Mobility    Modified Rankin (Stroke Patients Only)       Balance                                    Cognition Arousal/Alertness: Awake/alert Behavior During Therapy: WFL for tasks assessed/performed Overall Cognitive Status: Within Functional Limits for tasks assessed                       Exercises      General Comments        Pertinent Vitals/Pain Pain Assessment: 0-10 Pain Score: 3  Pain Location: buttock to sit Pain Descriptors / Indicators: Burning Pain Intervention(s): Limited activity within patient's tolerance;Monitored during session;Premedicated before session    Home Living                      Prior Function            PT Goals (current goals can now be found in the care plan section) Acute Rehab PT Goals Patient Stated Goal: to get stronger. PT Goal Formulation: With patient Time For Goal Achievement: 03/24/16 Potential to Achieve Goals: Good Progress towards PT goals: Progressing toward goals    Frequency    Min 3X/week      PT Plan Current plan remains appropriate    Co-evaluation             End of Session Equipment Utilized During Treatment: Gait belt Activity Tolerance: Patient tolerated treatment well Patient left: in bed;with call bell/phone within reach;with bed alarm set     Time: [id number] PT Time Calculation (min) (ACUTE ONLY): 18 min  Charges:  $Gait Training: 8-22 mins                    G Codes:  [facility] 03/11/2016, 1:29 PM

## 2016-03-11 NOTE — Progress Notes (Signed)
Patient ID: Danny Fuller, male   DOB: [birth year], 41 y.o.   MRN: [id number] 2 Days Post-Op   Subjective: Overall feels better. Pain is really only with dressing changes.  Objective: Vital signs in last 24 hours: Temp:  [98 F (36.7 C)-99.2 F (37.3 C)] 98.2 F (36.8 C) (01/27 0350) Pulse Rate:  [101-120] 101 (01/27 0623) Resp:  [12-19] 14 (01/27 0623) BP: (107-147)/(58-91) 147/91 (01/27 0623) SpO2:  [97 %-100 %] 100 % (01/27 0623) Last BM Date: 03/04/16  Intake/Output from previous day: 01/26 0701 - 01/27 0700 In: 3945 [P.O.:120; I.V.:2875; IV Piggyback:950] Out: [id number] [Urine:2650] Intake/Output this shift: No intake/output data recorded.  General appearance: alert, cooperative and no distress Incision/Wound: dressing with serous drainage. Wound packed. Surrounding tissue without significant erythema or induration.  Lab Results:   Recent Labs  03/10/16 0344 03/11/16 0342  WBC 25.2* 22.3*  HGB 7.8* 7.7*  HCT 22.2* 21.9*  PLT 393 447*   BMET  Recent Labs  03/10/16 0344 03/11/16 0342  NA 130* 129*  K 3.7 3.4*  CL 102 99*  CO2 21* 24  GLUCOSE 268* 193*  BUN 11 9  CREATININE 0.99 0.70  CALCIUM 7.6* 7.6*     Studies/Results: Recent Results (from the past 240 hour(s))  Urine culture     Status: None   Collection Time: 03/04/16  9:50 PM  Result Value Ref Range Status   Specimen Description URINE, CLEAN CATCH  Final   Special Requests NONE  Final   Culture   Final    NO GROWTH Performed at [facility], [place] [address] [place], [place] [address]    Report Status 03/06/2016 FINAL  Final  Blood Culture (routine x 2)     Status: None   Collection Time: 03/04/16 11:18 PM  Result Value Ref Range Status   Specimen Description BLOOD RIGHT ANTECUBITAL  Final   Special Requests BOTTLES DRAWN AEROBIC AND ANAEROBIC 6CC  Final   Culture   Final    NO GROWTH 5 DAYS Performed at [facility], [place] [address], [place], [place] [address]    Report  Status 03/10/2016 FINAL  Final  Blood Culture (routine x 2)     Status: None   Collection Time: 03/04/16 11:18 PM  Result Value Ref Range Status   Specimen Description BLOOD RIGHT HAND  Final   Special Requests BOTTLES DRAWN AEROBIC AND ANAEROBIC 6 CC  Final   Culture   Final    NO GROWTH 5 DAYS Performed at [facility], [place] [address] [place], [place] [address]    Report Status 03/10/2016 FINAL  Final  Anaerobic culture     Status: None (Preliminary result)   Collection Time: 03/07/16  7:07 PM  Result Value Ref Range Status   Specimen Description WOUND  Final   Special Requests RIGHT BUTTOCKS  Final   Culture   Final    HOLDING FOR POSSIBLE ANAEROBE Performed at [facility], [address] [place], [place] [address]    Report Status PENDING  Incomplete  Aerobic Culture (superficial specimen)     Status: None   Collection Time: 03/07/16  7:07 PM  Result Value Ref Range Status   Specimen Description WOUND  Final   Special Requests RIGHT BUTTOCKS  Final   Gram Stain   Final    RARE WBC PRESENT, PREDOMINANTLY PMN ABUNDANT GRAM POSITIVE RODS MODERATE GRAM POSITIVE COCCI IN CLUSTERS MODERATE GRAM NEGATIVE RODS    Culture  Final    NO GROWTH 2 DAYS Performed at [facility], [place] [address], [place], [place] [address]    Report Status 03/10/2016 FINAL  Final  MRSA PCR Screening     Status: None   Collection Time: 03/07/16 10:00 PM  Result Value Ref Range Status   MRSA by PCR NEGATIVE NEGATIVE Final    Comment:        The GeneXpert MRSA Assay (FDA approved for NASAL specimens only), is one component of a comprehensive MRSA colonization surveillance program. It is not intended to diagnose MRSA infection nor to guide or monitor treatment for MRSA infections.   Culture, blood (Routine X 2) w Reflex to ID Panel     Status: None (Preliminary result)   Collection Time: 03/09/16 12:01 PM  Result Value Ref Range Status   Specimen Description BLOOD RIGHT  ARM  Final   Special Requests BOTTLES DRAWN AEROBIC AND ANAEROBIC 5CC  Final   Culture   Final    NO GROWTH < 24 HOURS Performed at [facility], [place] [address] [place], [place] [address]    Report Status PENDING  Incomplete  Culture, blood (Routine X 2) w Reflex to ID Panel     Status: None (Preliminary result)   Collection Time: 03/09/16 12:02 PM  Result Value Ref Range Status   Specimen Description BLOOD RIGHT HAND  Final   Special Requests IN PEDIATRIC BOTTLE 3CC  Final   Culture   Final    NO GROWTH < 24 HOURS Performed at [facility], [place] [address], [place],  [address]    Report Status PENDING  Incomplete    Anti-infectives: Anti-infectives    Start     Dose/Rate Route Frequency Ordered Stop   03/10/16 1500  vancomycin (VANCOCIN) IVPB 1000 mg/200 mL premix     1,000 mg 200 mL/hr over 60 Minutes Intravenous Every 8 hours 03/10/16 1418     03/08/16 0200  vancomycin (VANCOCIN) IVPB 750 mg/150 ml premix  Status:  Discontinued     750 mg 150 mL/hr over 60 Minutes Intravenous Every 12 hours 03/07/16 1212 03/10/16 1418   03/07/16 1800  clindamycin (CLEOCIN) IVPB 900 mg     900 mg 100 mL/hr over 30 Minutes Intravenous Every 8 hours 03/07/16 1656     03/07/16 1400  meropenem (MERREM) 1 g in sodium chloride 0.9 % 100 mL IVPB     1 g 200 mL/hr over 30 Minutes Intravenous Every 8 hours 03/07/16 1212     03/07/16 1300  vancomycin (VANCOCIN) 2,000 mg in sodium chloride 0.9 % 500 mL IVPB     2,000 mg 250 mL/hr over 120 Minutes Intravenous  Once 03/07/16 1212 03/07/16 1456   03/05/16 1100  ceFEPIme (MAXIPIME) 2 g in dextrose 5 % 50 mL IVPB  Status:  Discontinued     2 g 100 mL/hr over 30 Minutes Intravenous Every 12 hours 03/05/16 0829 03/05/16 1003   03/05/16 1030  ceFEPIme (MAXIPIME) 1 g in dextrose 5 % 50 mL IVPB  Status:  Discontinued     1 g 100 mL/hr over 30 Minutes Intravenous Every 8 hours 03/05/16 1003 03/07/16 1140   03/05/16 0030  ceFEPIme (MAXIPIME)  2 g in dextrose 5 % 50 mL IVPB     2 g 100 mL/hr over 30 Minutes Intravenous  Once 03/05/16 0027 03/05/16 0157   03/04/16 2315  cefTRIAXone (ROCEPHIN) 2 g in dextrose 5 % 50 mL IVPB  2 g 100 mL/hr over 30 Minutes Intravenous  Once 03/04/16 2314 03/04/16 2357      Assessment/Plan: s/p Procedure(s): EXAM UNDER ANESTHESIA, IRRIGATION AND DEBRIDEMENT PERIRECTAL ABSCESS Overall seems improved with less pain, white blood count down slightly and afebrile. I will plan to examine wound at the time of hydrotherapy today.   LOS: 7 days    Danny Fuller T 03/11/2016

## 2016-03-12 LAB — GLUCOSE, CAPILLARY
GLUCOSE-CAPILLARY: 166 mg/dL — AB (ref 65–99)
GLUCOSE-CAPILLARY: 193 mg/dL — AB (ref 65–99)
Glucose-Capillary: 206 mg/dL — ABNORMAL HIGH (ref 65–99)
Glucose-Capillary: 196 mg/dL — ABNORMAL HIGH (ref 65–99)

## 2016-03-12 LAB — BASIC METABOLIC PANEL
ANION GAP: 7 (ref 5–15)
BUN: 7 mg/dL (ref 6–20)
CO2: 25 mmol/L (ref 22–32)
Calcium: 7.9 mg/dL — ABNORMAL LOW (ref 8.9–10.3)
Chloride: 98 mmol/L — ABNORMAL LOW (ref 101–111)
Creatinine, Ser: 0.7 mg/dL (ref 0.61–1.24)
GFR calc Af Amer: >60 mL/min (ref >60)
Glucose, Bld: 171 mg/dL — ABNORMAL HIGH (ref 65–99)
POTASSIUM: 3.8 mmol/L (ref 3.5–5.1)
SODIUM: 130 mmol/L — AB (ref 135–145)

## 2016-03-12 LAB — CBC
HCT: 22.5 % — ABNORMAL LOW (ref 39.0–52.0)
Hemoglobin: 8 g/dL — ABNORMAL LOW (ref 13.0–17.0)
MCH: 30.2 pg (ref 26.0–34.0)
MCHC: 35.6 g/dL (ref 30.0–36.0)
MCV: 84.9 fL (ref 78.0–100.0)
PLATELETS: 548 10*3/uL — AB (ref 150–400)
RBC: 2.65 MIL/uL — AB (ref 4.22–5.81)
RDW: 13.3 % (ref 11.5–15.5)
WBC: 16.3 10*3/uL — AB (ref 4.0–10.5)

## 2016-03-12 LAB — MAGNESIUM: MAGNESIUM: 1.7 mg/dL (ref 1.7–2.4)

## 2016-03-12 LAB — ANAEROBIC CULTURE

## 2016-03-12 LAB — VANCOMYCIN, TROUGH: Vancomycin Tr: 17 ug/mL (ref 15–20)

## 2016-03-12 NOTE — Progress Notes (Signed)
Patient ID: Danny Fuller, male   DOB: 08-22-1975, 41 y.o.   MRN: [id number] 3 Days Post-Op   Subjective: Feels much better today. Denies pain.  Objective: Vital signs in last 24 hours: Temp:  [98 F (36.7 C)-98.7 F (37.1 C)] 98.7 F (37.1 C) (01/28 0332) Pulse Rate:  [93-104] 102 (01/28 0332) Resp:  [12-22] 18 (01/28 0332) BP: (132-154)/(75-93) 149/93 (01/28 0332) SpO2:  [99 %-100 %] 99 % (01/28 0332) Last BM Date: 03/04/16  Intake/Output from previous day: 01/27 0701 - 01/28 0700 In: 3060 [P.O.:360; I.V.:1400; IV Piggyback:1300] Out: 4450 [Urine:4450] Intake/Output this shift: Total I/O In: -  Out: 575 [Urine:575]  General appearance: alert, cooperative and no distress Incision/Wound: Wound examined yesterday during hydrotherapy. It is deep extending along the perirectal space for at least 6 or 7 cm but there was no apparent necrotic tissue or severe drainage. Today there is serous drainage on the bandage and no significant induration or erythema or tenderness around the packing.  Lab Results:   Recent Labs  03/11/16 0342 03/12/16 0645  WBC 22.3* 16.3*  HGB 7.7* 8.0*  HCT 21.9* 22.5*  PLT 447* 548*   BMET  Recent Labs  03/11/16 0342 03/12/16 0645  NA 129* 130*  K 3.4* 3.8  CL 99* 98*  CO2 24 25  GLUCOSE 193* 171*  BUN 9 7  CREATININE 0.70 0.70  CALCIUM 7.6* 7.9*     Studies/Results: No results found.  Anti-infectives: Anti-infectives    Start     Dose/Rate Route Frequency Ordered Stop   03/10/16 1500  vancomycin (VANCOCIN) IVPB 1000 mg/200 mL premix     1,000 mg 200 mL/hr over 60 Minutes Intravenous Every 8 hours 03/10/16 1418     03/08/16 0200  vancomycin (VANCOCIN) IVPB 750 mg/150 ml premix  Status:  Discontinued     750 mg 150 mL/hr over 60 Minutes Intravenous Every 12 hours 03/07/16 1212 03/10/16 1418   03/07/16 1800  clindamycin (CLEOCIN) IVPB 900 mg     900 mg 100 mL/hr over 30 Minutes Intravenous Every 8 hours 03/07/16 1656     03/07/16 1400  meropenem (MERREM) 1 g in sodium chloride 0.9 % 100 mL IVPB     1 g 200 mL/hr over 30 Minutes Intravenous Every 8 hours 03/07/16 1212     03/07/16 1300  vancomycin (VANCOCIN) 2,000 mg in sodium chloride 0.9 % 500 mL IVPB     2,000 mg 250 mL/hr over 120 Minutes Intravenous  Once 03/07/16 1212 03/07/16 1456   03/05/16 1100  ceFEPIme (MAXIPIME) 2 g in dextrose 5 % 50 mL IVPB  Status:  Discontinued     2 g 100 mL/hr over 30 Minutes Intravenous Every 12 hours 03/05/16 0829 03/05/16 1003   03/05/16 1030  ceFEPIme (MAXIPIME) 1 g in dextrose 5 % 50 mL IVPB  Status:  Discontinued     1 g 100 mL/hr over 30 Minutes Intravenous Every 8 hours 03/05/16 1003 03/07/16 1140   03/05/16 0030  ceFEPIme (MAXIPIME) 2 g in dextrose 5 % 50 mL IVPB     2 g 100 mL/hr over 30 Minutes Intravenous  Once 03/05/16 0027 03/05/16 0157   03/04/16 2315  cefTRIAXone (ROCEPHIN) 2 g in dextrose 5 % 50 mL IVPB     2 g 100 mL/hr over 30 Minutes Intravenous  Once 03/04/16 2314 03/04/16 2357      Assessment/Plan: s/p Procedure(s): EXAM UNDER ANESTHESIA, IRRIGATION AND DEBRIDEMENT PERIRECTAL ABSCESS Appears to be improving with resolving pain  and decreasing WBC. Wound looked okay on exam yesterday. Continue current treatment and I think okay to transfer to floor.   LOS: 8 days    Makynlee Kressin T 03/12/2016

## 2016-03-12 NOTE — Progress Notes (Signed)
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 He met sepsis criteria on admission, code sepsis was activated and started on IV cefepime for presumed acute pyelonephritis. CT abdomen and pelvis showed right pyelonephritis. Blood and urine cultures were negative but patient failed to improve on IV cefepime and continued to have fevers. Right flank and abdominal pain resolved. On 1/23, complained of acute right buttock pain with drainage. Repeat imaging confirmed Fournier's gangrene, Gen. surgery performed I&D in the OR on 1/23, postop he developed hypotension and was transferred to stepdown unit. Hypotension resolved. On 1/25, more pain, persistent tachycardia, worsening leukocytosis, concern for residual infection and hence CCS the patient back to OR on 1/25 and underwent manual digital exploration with evacuation of bloody tan pus. Clinically improved. Transfer to medical bed 1/28   Assessment & Plan:   Principal Problem:   Necrotizing fasciitis [facility]) Active Problems:   Essential hypertension   Sepsis ([place])   Diabetic polyneuropathy associated with type 2 diabetes mellitus ([place])   Pyelonephritis   1. Sepsis secondary to Fournier's gangrene: As indicated above, initially treated for presumed right-sided acute pyelonephritis based on clinical picture and CT abdomen. Blood cultures 2 from 1/20: Negative. Urine culture 1/20: Negative. Wound culture 1/23: No growth to date. Repeat blood cultures 1/25: Negative to date. Continued to spike fevers. On 1/23 new complaints of right buttock pain and drainage. CT  abdomen and pelvis confirmed Fournier's gangrene. General surgery was consulted and performed I&D in OR on 1/23. Postop developed hypotension and was transferred to stepdown unit for close monitoring and management. Hypotension resolved with IV fluids. Antibiotics were broadened to IV meropenem, vancomycin and clindamycin. Monitor BMP closely while on vancomycin (prior history of acute kidney injury secondary to vancomycin- updated his nephrologist on 1/25). On 1/25, more pain, persistent tachycardia, worsening leukocytosis, concern for residual infection and hence CCS the patient back to OR on 1/25 and underwent manual digital exploration with evacuation of bloody tan pus. Wound care per surgery. Continue hydrotherapy. Slowly improving >less pain, no high fevers since 1/25 and leukocytosis improving. Discussed with general surgery, continues to improve and OK to transfer to medical bed. 2. 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 CBGs are better . Uncontrolled DM related to stress response/infection. Monitor closely and may need further titration of insulins and or addition of mealtime NovoLog. Hemoglobin A1c: 11.6. Continue Lyrica. Tramadol discontinued. Diabetes coordinator input appreciated. Good inpatient control  3. Essential hypertension: Transient postop hypotension 1/23 has resolved. Pharmacy reviewed home medications again on 1/24. He does not take labetalol and was discontinued. Continue amlodipine, clonidine, hydralazine and lisinopril. Fluctuating and mildly uncontrolled at times. Continue current regimen without change. 4. Sinus tachycardia: Due to problem #1 and pain. Improved. 5. Anemia: Baseline hemoglobin may be in the 10-11 range. Hemoglobin was in the mid 8 g per DL range for the last couple of days but has dropped to 7.8>7.7 on 1/27 >likely secondary to acute illness/infection and some degree of surgical blood  loss. Follow CBCs daily and transfuse if hemoglobin <7 g per DL.  Hb 8. 6. Leukocytosis:  Secondary to sepsis. Management as above. Follow CBCs daily. Improving. 7. Hypokalemia: replaced. Mg 1.7 8. Hyponatremia: Stable. Likely multifactorial secondary to? SIADH and hyperglycemia. Clinically euvolemic. Follow BMP periodically.   DVT prophylaxis: Lovenox Code Status: Full Family Communication: None at bedside. I called on 03/10/16 and left a message for patient's spouse to call back so that I can update her on his care. Disposition Plan: Transferred to stepdown unit on 1/23. Transfer to medical 1/28. DC home in 3-4 days.   Consultants:   General surgery  Procedures:   I&D of perirectal/perineal abscess by general surgery on 03/07/16  On 1/25: Exam under anesthesia with opening on the right near base of scrotum;  Manual digital exploration with evacuation of bloody tan pus  Antimicrobials:   IV cefepime 1/20 > 1/23  IV clindamycin 1/23 >  IV meropenem 1/23 >  IV vancomycin 1/23 >    Subjective: Rested well last night. Does not report pain this morning. Denies any other complaints.  Objective:  Vitals:   03/11/16 2349 03/12/16 0002 03/12/16 0332 03/12/16 0800  BP: 137/90  (!) 149/93 (!) 158/84  Pulse: 96  (!) 102 92  Resp: 12  18 14   Temp:  98.7 F (37.1 C) 98.7 F (37.1 C) 97.8 F (36.6 C)  TempSrc:  Oral Oral Oral  SpO2: 100%  99% 100%  Weight:      Height:        Intake/Output Summary (Last 24 hours) at 03/12/16 1045 Last data filed at 03/12/16 0849  Gross per 24 hour  Intake             2515 ml  Output             4475 ml  Net            -1960 ml   Filed Weights   03/04/16 1549 03/07/16 2200  Weight: 108 kg (238 lb) 116 kg (255 lb 11.7 oz)    Examination:  General exam: Pleasant young male lying comfortably supine in bed. Does not look septic or toxic. Respiratory system: Clear to auscultation. Respiratory effort normal. Cardiovascular system: S1 & S2  heard, RRR. No JVD, murmurs, rubs, gallops or clicks. No pedal edema.  Gastrointestinal system: Abdomen is nondistended, soft and nontender. No organomegaly or masses felt. Normal bowel sounds heard. Central nervous system: Alert and oriented. No focal neurological deficits. Extremities: Symmetric 5 x 5 power. Skin: No rashes, lesions or ulcers. Sacral area dressing clean and dry. Psychiatry: Judgement and insight appear normal. Mood & affect appropriate.     Data Reviewed: I have personally reviewed following labs and imaging studies  CBC:  Recent Labs Lab 03/06/16 0534 03/07/16 0549  03/08/16 0321 03/09/16 0350 03/10/16 0344 03/11/16 0342 03/12/16 0645  WBC 18.5* 23.3*  < > 25.1* 26.2* 25.2* 22.3* 16.3*  NEUTROABS 14.7* 19.5*  --  21.3* 22.0* 20.9*  --   --   HGB 9.6* 9.3*  < > 8.5* 8.8* 7.8* 7.7* 8.0*  HCT 27.4* 26.5*  < > 25.1* 25.4* 22.2* 21.9* 22.5*  MCV 84.8 85.8  < > 84.2 85.5 85.4 85.5 84.9  PLT 242 278  < > 307 370 393 447* 548*  < > = values in this interval not displayed. Basic Metabolic Panel:  Recent Labs Lab 03/08/16 0321 03/09/16 0350 03/10/16 0344 03/11/16 0342 03/12/16 0645  NA 131* 131* 130* 129* 130*  K 4.5 3.7 3.7 3.4* 3.8  CL 104 102 102 99* 98*  CO2  20* 21* 21* 24 25  GLUCOSE 235* 199* 268* 193* 171*  BUN 17 12 11 9 7   CREATININE 1.14 0.93 0.99 0.70 0.70  CALCIUM 8.0* 8.1* 7.6* 7.6* 7.9*  MG  --   --   --   --  1.7   GFR: Estimated Creatinine Clearance: 157.3 mL/min (by C-G formula based on SCr of 0.7 mg/dL). Liver Function Tests: No results for input(s): AST, ALT, ALKPHOS, BILITOT, PROT, ALBUMIN in the last 168 hours. No results for input(s): LIPASE, AMYLASE in the last 168 hours. No results for input(s): AMMONIA in the last 168 hours. Coagulation Profile: No results for input(s): INR, PROTIME in the last 168 hours. Cardiac Enzymes: No results for input(s): CKTOTAL, CKMB, CKMBINDEX, TROPONINI in the last 168 hours. BNP (last 3  results) No results for input(s): PROBNP in the last 8760 hours. HbA1C: No results for input(s): HGBA1C in the last 72 hours. CBG:  Recent Labs Lab 03/11/16 0732 03/11/16 1218 03/11/16 1555 03/11/16 2137 03/12/16 0736  GLUCAP 188* 148* 186* 159* 166*   Lipid Profile: No results for input(s): CHOL, HDL, LDLCALC, TRIG, CHOLHDL, LDLDIRECT in the last 72 hours. Thyroid Function Tests: No results for input(s): TSH, T4TOTAL, FREET4, T3FREE, THYROIDAB in the last 72 hours. Anemia Panel: No results for input(s): VITAMINB12, FOLATE, FERRITIN, TIBC, IRON, RETICCTPCT in the last 72 hours.  Sepsis Labs:  Recent Labs Lab 03/09/16 0350 03/10/16 0344 03/11/16 0342 03/12/16 0645  WBC 26.2* 25.2* 22.3* 16.3*    Recent Results (from the past 240 hour(s))  Urine culture     Status: None   Collection Time: 03/04/16  9:50 PM  Result Value Ref Range Status   Specimen Description URINE, CLEAN CATCH  Final   Special Requests NONE  Final   Culture   Final    NO GROWTH Performed at [facility], [place] [address] [place], [place] [address]    Report Status 03/06/2016 FINAL  Final  Blood Culture (routine x 2)     Status: None   Collection Time: 03/04/16 11:18 PM  Result Value Ref Range Status   Specimen Description BLOOD RIGHT ANTECUBITAL  Final   Special Requests BOTTLES DRAWN AEROBIC AND ANAEROBIC 6CC  Final   Culture   Final    NO GROWTH 5 DAYS Performed at [facility], [place] [address] [place], [place] [address]    Report Status 03/10/2016 FINAL  Final  Blood Culture (routine x 2)     Status: None   Collection Time: 03/04/16 11:18 PM  Result Value Ref Range Status   Specimen Description BLOOD RIGHT HAND  Final   Special Requests BOTTLES DRAWN AEROBIC AND ANAEROBIC 6 CC  Final   Culture   Final    NO GROWTH 5 DAYS Performed at [facility], [place] [address], [place], [place] [address]    Report Status 03/10/2016 FINAL  Final  Anaerobic culture     Status: None  (Preliminary result)   Collection Time: 03/07/16  7:07 PM  Result Value Ref Range Status   Specimen Description WOUND  Final   Special Requests RIGHT BUTTOCKS  Final   Culture   Final    HOLDING FOR POSSIBLE ANAEROBE Performed at [facility], [address] [place], [place] [address]    Report Status PENDING  Incomplete  Aerobic Culture (superficial specimen)     Status: None   Collection Time: 03/07/16  7:07 PM  Result Value Ref Range Status  Specimen Description WOUND  Final   Special Requests RIGHT BUTTOCKS  Final   Gram Stain   Final    RARE WBC PRESENT, PREDOMINANTLY PMN ABUNDANT GRAM POSITIVE RODS MODERATE GRAM POSITIVE COCCI IN CLUSTERS MODERATE GRAM NEGATIVE RODS    Culture   Final    NO GROWTH 2 DAYS Performed at [facility], [place] [address], [place], [place] [address]    Report Status 03/10/2016 FINAL  Final  MRSA PCR Screening     Status: None   Collection Time: 03/07/16 10:00 PM  Result Value Ref Range Status   MRSA by PCR NEGATIVE NEGATIVE Final    Comment:        The GeneXpert MRSA Assay (FDA approved for NASAL specimens only), is one component of a comprehensive MRSA colonization surveillance program. It is not intended to diagnose MRSA infection nor to guide or monitor treatment for MRSA infections.   Culture, blood (Routine X 2) w Reflex to ID Panel     Status: None (Preliminary result)   Collection Time: 03/09/16 12:01 PM  Result Value Ref Range Status   Specimen Description BLOOD RIGHT ARM  Final   Special Requests BOTTLES DRAWN AEROBIC AND ANAEROBIC 5CC  Final   Culture   Final    NO GROWTH 2 DAYS Performed at [facility], [place] [address], [place], [place] [address]    Report Status PENDING  Incomplete  Culture, blood (Routine X 2) w Reflex to ID Panel     Status: None (Preliminary result)   Collection Time: 03/09/16 12:02 PM  Result Value Ref Range Status   Specimen Description BLOOD RIGHT HAND  Final   Special Requests  IN PEDIATRIC BOTTLE 3CC  Final   Culture   Final    NO GROWTH 2 DAYS Performed at [facility], [place] [address] [place], [place] [address]    Report Status PENDING  Incomplete         Radiology Studies: No results found.      Scheduled Meds: . amLODipine  10 mg Oral Daily  . clindamycin (CLEOCIN) IV  900 mg Intravenous Q8H  . cloNIDine  0.1 mg Oral BID  . enoxaparin (LOVENOX) injection  50 mg Subcutaneous QHS  . hydrALAZINE  25 mg Oral Q8H  . insulin aspart  0-15 Units Subcutaneous TID WC  . insulin aspart  0-5 Units Subcutaneous QHS  . insulin glargine  30 Units Subcutaneous Q2200  . lisinopril  20 mg Oral Daily  . meropenem (MERREM) IV  1 g Intravenous Q8H  . pregabalin  75 mg Oral BID  . sodium chloride flush  3 mL Intravenous Q12H  . vancomycin  1,000 mg Intravenous Q8H   Continuous Infusions:    LOS: 8 days        [facility], MD [facility] Pager [phone]  If 7PM-7AM, please contact night-coverage [email or web address] Password [email or web address] 03/12/2016, 10:45 AM

## 2016-03-12 NOTE — Progress Notes (Signed)
Pharmacy Antibiotic Note  Danny Fuller is a 41 y.o. 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  He is s/p I&D 1/23 and digital exploration on 1/25 with drainage of bloody, tan pus.  Pharmacy has been consulted for meropenem and vancomycin dosing. Patient has h/o AKI, presumably d/t vancomycin (appears was on 1500mg  IV q8h).  Data suggests increase nephrotoxicity with daily doses > 4gm .    Day #9 abx Day #6 clindamycin/meropenem/vancomcyin  Vancomycin trough 17 mcg/ml on 1g q8h dosing today  Plan:  Continue Vancomycin 1gm IV q8h.  Follow SCr at least q48h  Continue meropenem 1gm IV q8h.  Height: 5\' 9"  (175.3 cm) Weight: 255 lb 11.7 oz (116 kg) IBW/kg (Calculated) : 70.7  Temp (24hrs), Avg:98.2 F (36.8 C), Min:97.8 F (36.6 C), Max:98.7 F (37.1 C)   Recent Labs Lab 03/08/16 0321 03/09/16 0350 03/10/16 0344 03/10/16 1253 03/11/16 0342 03/12/16 0645 03/12/16 1318  WBC 25.1* 26.2* 25.2*  --  22.3* 16.3*  --   CREATININE 1.14 0.93 0.99  --  0.70 0.70  --   VANCOTROUGH  --   --   --  8*  --   --  17    Estimated Creatinine Clearance: 157.3 mL/min (by C-G formula based on SCr of 0.7 mg/dL).    No Known Allergies   Antimicrobials this admission:  Ceftriaxone 1/20 x1 Cefepime 1/21 >> 1/23 1/23 Vanc >> 1/23 meropenem >> 1/23 clindamycin >>  Dose adjustments this admission:  1/26 1300 VT = 8 mcg/ml on 750mg  IV q12h (prior to 6th dose), increase dose to 1gm q8h  1/28 0500 VT = 17 on 1g q8h  Microbiology results:  1/20 BCx x2: NGF 1/20 UCx: ngf 1/23 MRSA PCR neg 1/23 buttock wound cx (aerobic): NG 1/23 buttock wound cx (anaerobic): mixed anaerobic flora present 1/25 BCx: ngtd   Thank you for allowing pharmacy to be a part of this patient's care.  Hershal Coria, PharmD, BCPS Pager: [phone] 03/12/2016 4:06 PM

## 2016-03-13 LAB — CBC
HEMATOCRIT: 23 % — AB (ref 39.0–52.0)
Hemoglobin: 8 g/dL — ABNORMAL LOW (ref 13.0–17.0)
MCH: 29.9 pg (ref 26.0–34.0)
MCHC: 34.8 g/dL (ref 30.0–36.0)
MCV: 85.8 fL (ref 78.0–100.0)
PLATELETS: 592 10*3/uL — AB (ref 150–400)
RBC: 2.68 MIL/uL — ABNORMAL LOW (ref 4.22–5.81)
RDW: 13.4 % (ref 11.5–15.5)
WBC: 14.3 10*3/uL — ABNORMAL HIGH (ref 4.0–10.5)

## 2016-03-13 LAB — GLUCOSE, CAPILLARY
GLUCOSE-CAPILLARY: 190 mg/dL — AB (ref 65–99)
Glucose-Capillary: 183 mg/dL — ABNORMAL HIGH (ref 65–99)
Glucose-Capillary: 211 mg/dL — ABNORMAL HIGH (ref 65–99)
Glucose-Capillary: 233 mg/dL — ABNORMAL HIGH (ref 65–99)

## 2016-03-13 LAB — BASIC METABOLIC PANEL
Anion gap: 8 (ref 5–15)
BUN: 8 mg/dL (ref 6–20)
CALCIUM: 7.9 mg/dL — AB (ref 8.9–10.3)
CO2: 24 mmol/L (ref 22–32)
CREATININE: 0.86 mg/dL (ref 0.61–1.24)
Chloride: 95 mmol/L — ABNORMAL LOW (ref 101–111)
GLUCOSE: 231 mg/dL — AB (ref 65–99)
Potassium: 4.5 mmol/L (ref 3.5–5.1)
Sodium: 127 mmol/L — ABNORMAL LOW (ref 135–145)

## 2016-03-13 MED ORDER — INSULIN ASPART 100 UNIT/ML ~~LOC~~ SOLN
4.0000 [IU] | Freq: Three times a day (TID) | SUBCUTANEOUS | Status: DC
  Administered 2016-03-13 – 2016-03-14 (×2): 4 [IU] via SUBCUTANEOUS

## 2016-03-13 MED ORDER — INSULIN GLARGINE 100 UNIT/ML ~~LOC~~ SOLN
34.0000 [IU] | Freq: Every day | SUBCUTANEOUS | Status: DC
  Administered 2016-03-13: 34 [IU] via SUBCUTANEOUS
  Filled 2016-03-13: qty 0.34

## 2016-03-13 NOTE — Progress Notes (Signed)
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 He met sepsis criteria on admission, code sepsis was activated and started on IV cefepime for presumed acute pyelonephritis. CT abdomen and pelvis showed right pyelonephritis. Blood and urine cultures were negative but patient failed to improve on IV cefepime and continued to have fevers. Right flank and abdominal pain resolved. On 1/23, complained of acute right buttock pain with drainage. Repeat imaging confirmed Fournier's gangrene, Gen. surgery performed I&D in the OR on 1/23, postop he developed hypotension and was transferred to stepdown unit. Hypotension resolved. On 1/25, more pain, persistent tachycardia, worsening leukocytosis, concern for residual infection and hence CCS the patient back to OR on 1/25 and underwent manual digital exploration with evacuation of bloody tan pus. Clinically improved. Transferred to medical bed 1/28   Assessment & Plan:   Principal Problem:   Necrotizing fasciitis ([place]) Active Problems:   Essential hypertension   Sepsis ([place])   Diabetic polyneuropathy associated with type 2 diabetes mellitus ([place])   Pyelonephritis   1. Sepsis secondary to Fournier's gangrene: As indicated above, initially treated for presumed right-sided acute pyelonephritis based on clinical picture and CT abdomen. Blood cultures 2 from 1/20: Negative. Urine culture 1/20: Negative. Wound culture 1/23: No growth to date. Repeat blood cultures 1/25: Negative to date. Continued to spike fevers. On 1/23 new complaints of right buttock pain and drainage. CT  abdomen and pelvis confirmed Fournier's gangrene. General surgery was consulted and performed I&D in OR on 1/23. Postop developed hypotension and was transferred to stepdown unit for close monitoring and management. Hypotension resolved with IV fluids. Antibiotics were broadened to IV meropenem, vancomycin and clindamycin. Monitor BMP closely while on vancomycin (prior history of acute kidney injury secondary to vancomycin- updated his nephrologist on 1/25). On 1/25, more pain, persistent tachycardia, worsening leukocytosis, concern for residual infection and hence CCS the patient back to OR on 1/25 and underwent manual digital exploration with evacuation of bloody tan pus. Wound care per surgery. Continue hydrotherapy. Slowly improving >less pain, no high fevers since 1/25 and leukocytosis improving. Discussed with general surgery, continues to improve and wound looks OK. ? Deescalate Abx. 2. Uncontrolled insulin-dependent type 2 diabetes with polyneuropathy: Increased Lantus 34 units at bedtime and continue SSI. CBGs mildly uncontrolled . Uncontrolled DM related to stress response/infection. Monitor closely and may need further titration of insulins and or addition of mealtime NovoLog. Hemoglobin A1c: 11.6. Continue Lyrica. Tramadol discontinued. Diabetes coordinator input appreciated. Add NovoLog 4 units 3 times a day with meals. 3. Essential hypertension: Transient postop hypotension 1/23 has resolved. Pharmacy reviewed home medications again on 1/24. He does not take labetalol and was discontinued. Continue amlodipine, clonidine, hydralazine and lisinopril. Fluctuating and mildly uncontrolled at times. Continue current regimen without change. 4. Sinus tachycardia: Due to problem #1 and pain. Resolved. 5.  6.  7. Anemia: Baseline hemoglobin may be in the 10-11 range. Hemoglobin was in the mid 8 g per DL range for the last couple of days but has dropped to 7.8>7.7 on 1/27 >likely secondary to acute  illness/infection and some degree of surgical blood loss. Follow CBCs daily and transfuse if  hemoglobin <7 g per DL.  Hb 8 and stable for the last 2 days. 8. Leukocytosis: Secondary to sepsis. Management as above. Follow CBCs daily. Improving. 9. Hypokalemia: replaced. Mg 1.7 10. Hyponatremia: Stable. Likely multifactorial secondary to? SIADH and hyperglycemia. Clinically euvolemic. Follow BMP periodically. Sodium 127 on 1/29.   DVT prophylaxis: Lovenox Code Status: Full Family Communication: None at bedside. Advised patient to have his spouse call if wished to speak with us. He indicated that he would update her on his ongoing care. Disposition Plan: Transferred to stepdown unit on 1/23. Transfer to medical 1/28. DC home in 3-4 days.   Consultants:   General surgery  Procedures:   I&D of perirectal/perineal abscess by general surgery on 03/07/16  On 1/25: Exam under anesthesia with opening on the right near base of scrotum;  Manual digital exploration with evacuation of bloody tan pus  Antimicrobials:   IV cefepime 1/20 > 1/23  IV clindamycin 1/23 >  IV meropenem 1/23 >  IV vancomycin 1/23 >    Subjective: Ambulating to the bathroom and back. Had BM. Mild pain, controlled. Tolerating diet. Denies any other complaints.  Objective:  Vitals:   03/12/16 1200 03/12/16 1335 03/12/16 2137 03/13/16 0602  BP: 134/78 (!) 171/84 (!) 164/96 137/79  Pulse:  96 99 90  Resp: 10 16 19 18  Temp: 98 F (36.7 C) 97.8 F (36.6 C) 98.1 F (36.7 C) 98.1 F (36.7 C)  TempSrc: Oral Oral Oral Oral  SpO2:  100% 100% 99%  Weight:      Height:        Intake/Output Summary (Last 24 hours) at 03/13/16 1159 Last data filed at 03/13/16 0903  Gross per 24 hour  Intake              500 ml  Output             3000 ml  Net            -2500 ml   Filed Weights   03/04/16 1549 03/07/16 2200  Weight: 108 kg (238 lb) 116 kg (255 lb 11.7 oz)    Examination:  General exam: Pleasant young  male lying comfortably supine in bed. Does not look septic or toxic. Respiratory system: Clear to auscultation. Respiratory effort normal. Cardiovascular system: S1 & S2 heard, RRR. No JVD, murmurs, rubs, gallops or clicks. No pedal edema.  Gastrointestinal system: Abdomen is nondistended, soft and nontender. No organomegaly or masses felt. Normal bowel sounds heard. Central nervous system: Alert and oriented. No focal neurological deficits. Extremities: Symmetric 5 x 5 power. Skin: No rashes, lesions or ulcers. Sacral area dressing clean and dry. Psychiatry: Judgement and insight appear normal. Mood & affect appropriate.     Data Reviewed: I have personally reviewed following labs and imaging studies  CBC:  Recent Labs Lab 03/07/16 0549  03/08/16 0321 03/09/16 0350 03/10/16 0344 03/11/16 0342 03/12/16 0645 03/13/16 0353  WBC 23.3*  < > 25.1* 26.2* 25.2* 22.3* 16.3* 14.3*  NEUTROABS 19.5*  --  21.3* 22.0* 20.9*  --   --   --   HGB 9.3*  < > 8.5* 8.8* 7.8* 7.7* 8.0* 8.0*  HCT 26.5*  < > 25.1* 25.4* 22.2* 21.9* 22.5* 23.0*  MCV 85.8  < > 84.2 85.5 85.4 85.5 84.9 85.8  PLT 278  < > 307 370 393 447* 548* 592*  < > = values in this interval not displayed. Basic Metabolic Panel:  Recent Labs Lab 03/09/16 0350 03/10/16   0344 03/11/16 0342 03/12/16 0645 03/13/16 0353  NA 131* 130* 129* 130* 127*  K 3.7 3.7 3.4* 3.8 4.5  CL 102 102 99* 98* 95*  CO2 21* 21* 24 25 24  GLUCOSE 199* 268* 193* 171* 231*  BUN 12 11 9 7 8  CREATININE 0.93 0.99 0.70 0.70 0.86  CALCIUM 8.1* 7.6* 7.6* 7.9* 7.9*  MG  --   --   --  1.7  --    GFR: Estimated Creatinine Clearance: 146.3 mL/min (by C-G formula based on SCr of 0.86 mg/dL). Liver Function Tests: No results for input(s): AST, ALT, ALKPHOS, BILITOT, PROT, ALBUMIN in the last 168 hours. No results for input(s): LIPASE, AMYLASE in the last 168 hours. No results for input(s): AMMONIA in the last 168 hours. Coagulation Profile: No results for  input(s): INR, PROTIME in the last 168 hours. Cardiac Enzymes: No results for input(s): CKTOTAL, CKMB, CKMBINDEX, TROPONINI in the last 168 hours. BNP (last 3 results) No results for input(s): PROBNP in the last 8760 hours. HbA1C: No results for input(s): HGBA1C in the last 72 hours. CBG:  Recent Labs Lab 03/12/16 0736 03/12/16 1204 03/12/16 1614 03/12/16 2135 03/13/16 0901  GLUCAP 166* 206* 196* 193* 183*   Lipid Profile: No results for input(s): CHOL, HDL, LDLCALC, TRIG, CHOLHDL, LDLDIRECT in the last 72 hours. Thyroid Function Tests: No results for input(s): TSH, T4TOTAL, FREET4, T3FREE, THYROIDAB in the last 72 hours. Anemia Panel: No results for input(s): VITAMINB12, FOLATE, FERRITIN, TIBC, IRON, RETICCTPCT in the last 72 hours.  Sepsis Labs:  Recent Labs Lab 03/10/16 0344 03/11/16 0342 03/12/16 0645 03/13/16 0353  WBC 25.2* 22.3* 16.3* 14.3*    Recent Results (from the past 240 hour(s))  Urine culture     Status: None   Collection Time: 03/04/16  9:50 PM  Result Value Ref Range Status   Specimen Description URINE, CLEAN CATCH  Final   Special Requests NONE  Final   Culture   Final    NO GROWTH Performed at [facility], [address] [place], [place] [address]    Report Status 03/06/2016 FINAL  Final  Blood Culture (routine x 2)     Status: None   Collection Time: 03/04/16 11:18 PM  Result Value Ref Range Status   Specimen Description BLOOD RIGHT ANTECUBITAL  Final   Special Requests BOTTLES DRAWN AEROBIC AND ANAEROBIC 6CC  Final   Culture   Final    NO GROWTH 5 DAYS Performed at [facility], [address] [place], [place] [address]    Report Status 03/10/2016 FINAL  Final  Blood Culture (routine x 2)     Status: None   Collection Time: 03/04/16 11:18 PM  Result Value Ref Range Status   Specimen Description BLOOD RIGHT HAND  Final   Special Requests BOTTLES DRAWN AEROBIC AND ANAEROBIC 6 CC  Final   Culture   Final    NO GROWTH 5  DAYS Performed at [facility], [address] , [place] [address]    Report Status 03/10/2016 FINAL  Final  Anaerobic culture     Status: None   Collection Time: 03/07/16  7:07 PM  Result Value Ref Range Status   Specimen Description WOUND  Final   Special Requests   Final    RIGHT BUTTOCKS Performed at [facility], [address] , [place] [address]    Culture   Final    MIXED ANAEROBIC FLORA PRESENT.    CALL LAB IF FURTHER IID REQUIRED.   Report Status 03/12/2016 FINAL  Final  Aerobic Culture (superficial specimen)     Status: None   Collection Time: 03/07/16  7:07 PM  Result Value Ref Range Status   Specimen Description WOUND  Final   Special Requests RIGHT BUTTOCKS  Final   Gram Stain   Final    RARE WBC PRESENT, PREDOMINANTLY PMN ABUNDANT GRAM POSITIVE RODS MODERATE GRAM POSITIVE COCCI IN CLUSTERS MODERATE GRAM NEGATIVE RODS    Culture   Final    NO GROWTH 2 DAYS Performed at [facility], [address] [place], [place] [address]    Report Status 03/10/2016 FINAL  Final  MRSA PCR Screening     Status: None   Collection Time: 03/07/16 10:00 PM  Result Value Ref Range Status   MRSA by PCR NEGATIVE NEGATIVE Final    Comment:        The GeneXpert MRSA Assay (FDA approved for NASAL specimens only), is one component of a comprehensive MRSA colonization surveillance program. It is not intended to diagnose MRSA infection nor to guide or monitor treatment for MRSA infections.   Culture, blood (Routine X 2) w Reflex to ID Panel     Status: None (Preliminary result)   Collection Time: 03/09/16 12:01 PM  Result Value Ref Range Status   Specimen Description BLOOD RIGHT ARM  Final   Special Requests BOTTLES DRAWN AEROBIC AND ANAEROBIC 5CC  Final   Culture   Final    NO GROWTH 3 DAYS Performed at [facility], [address] [place], [place] [address]    Report Status PENDING  Incomplete  Culture, blood (Routine X 2) w Reflex  to ID Panel     Status: None (Preliminary result)   Collection Time: 03/09/16 12:02 PM  Result Value Ref Range Status   Specimen Description BLOOD RIGHT HAND  Final   Special Requests IN PEDIATRIC BOTTLE 3CC  Final   Culture   Final    NO GROWTH 3 DAYS Performed at [facility], [address] [place], [place] [address]    Report Status PENDING  Incomplete         Radiology Studies: No results found.      Scheduled Meds: . amLODipine  10 mg Oral Daily  . clindamycin (CLEOCIN) IV  900 mg Intravenous Q8H  . cloNIDine  0.1 mg Oral BID  . enoxaparin (LOVENOX) injection  50 mg Subcutaneous QHS  . hydrALAZINE  25 mg Oral Q8H  . insulin aspart  0-15 Units Subcutaneous TID WC  . insulin aspart  0-5 Units Subcutaneous QHS  . insulin glargine  30 Units Subcutaneous Q2200  . lisinopril  20 mg Oral Daily  . meropenem (MERREM) IV  1 g Intravenous Q8H  . pregabalin  75 mg Oral BID  . sodium chloride flush  3 mL Intravenous Q12H  . vancomycin  1,000 mg Intravenous Q8H   Continuous Infusions:    LOS: 9 days        HONGALGI,ANAND, MD [facility] Pager [phone]  If 7PM-7AM, please contact night-coverage [email or web address] Password [email or web address] 03/13/2016, 11:59 AM   

## 2016-03-13 NOTE — Progress Notes (Signed)
PT HYDROTHERAPY  03/13/16 1100  Subjective Assessment  Patient and Family Stated Goals to heal this wound  Date of Onset 03/04/16  Prior Treatments I and D x 2  Wound / Incision (Open or Dehisced) 03/10/16 Incision - Open Perineum Right;Medial open surgical wound perirectal on the right  buttock  Date First Assessed/Time First Assessed: 03/10/16 0815   Wound Type: Incision - Open  Location: Perineum  Location Orientation: Right;Medial  Wound Description (Comments): open surgical wound perirectal on the right  buttock  Present on Admission: No  Dressing Type ABD;Gauze (Comment);Moist to moist;Mesh briefs (packed with iodoform today)  Dressing Changed Changed  Site / Wound Assessment Clean;Granulation tissue;Painful;Pink;Yellow  % Wound base Red or Granulating 70%  % Wound base Yellow/Fibrinous Exudate 30% (unable to visualize wound base)  Peri-wound Assessment Intact;Edema;Induration  Wound Length (cm) (see eval)  Tunneling (cm) (see eval)  Non-staged Wound Description Not applicable  Treatment Debridement (Selective);Cleansed;Hydrotherapy (Pulse lavage);Packing (Plain strip)  Incision (Closed) 03/07/16 Rectum Right  Date First Assessed/Time First Assessed: 03/07/16 1900   Location: Rectum  Location Orientation: Right  Dressing Type (iodoform packing)  Hydrotherapy  Pulsed lavage therapy - wound location perirectal on the right  Pulsed Lavage with Suction (psi) 8 psi  Pulsed Lavage with Suction - Normal Saline Used 1000 mL  Pulsed Lavage Tip Tip with splash shield  Selective Debridement  Selective Debridement - Location perirectal wound  Selective Debridement - Tools Used Forceps;Scissors  Selective Debridement - Tissue Removed fibrinous tissue  Wound Therapy - Assess/Plan/Recommendations  Wound Therapy - Clinical Statement wound is with incr granulation tissue today, although unable to visualize  wound base, decr induration, mild edema continues; will continue to follow for  hydrotherapy  Wound Therapy - Functional Problem List the patient is independent in bed mobility. will benefit from ambulation   Factors Delaying/Impairing Wound Healing Diabetes Mellitus  Hydrotherapy Plan Debridement;Dressing change;Pulsatile lavage with suction;Patient/family education  Wound Therapy - Frequency 6X / week  Wound Therapy - Current Recommendations PT  Wound Therapy - Follow Up Recommendations Home health RN  Wound Plan PLS 6 x week  Wound Therapy Goals - Improve the function of patient's integumentary system by progressing the wound(s) through the phases of wound healing by:  Decrease Necrotic Tissue to 0  Decrease Necrotic Tissue - Progress Progressing toward goal  Increase Granulation Tissue to 100  Increase Granulation Tissue - Progress Progressing toward goal  Decrease Length/Width/Depth by (cm) 2 cm  Decrease Length/Width/Depth - Progress Progressing toward goal  Improve Drainage Characteristics Min;Serous  Improve Drainage Characteristics - Progress Progressing toward goal  Patient/Family Instruction Goal - Progress Met  Goals/treatment plan/discharge plan were made with and agreed upon by patient/family Yes  Time For Goal Achievement 2 weeks  Wound Therapy - Potential for Goals Excellent

## 2016-03-13 NOTE — Progress Notes (Signed)
Inpatient Diabetes Program Recommendations  AACE/ADA: New Consensus Statement on Inpatient Glycemic Control (2015)  Target Ranges:  Prepandial:   less than 140 mg/dL      Peak postprandial:   less than 180 mg/dL (1-2 hours)      Critically ill patients:  140 - 180 mg/dL   Lab Results  Component Value Date   GLUCAP 183 (H) 03/13/2016   HGBA1C 11.6 (H) 03/06/2016    Review of Glycemic Control  Results for TRYG, INDELICATO (MRN [id number]) as of 03/13/2016 09:10  Ref. Range 03/12/2016 12:04 03/12/2016 16:14 03/12/2016 21:35 03/13/2016 09:01  Glucose-Capillary Latest Ref Range: 65 - 99 mg/dL 206 (H) 196 (H) 193 (H) 183 (H)   Needs insulin adjustment.  Inpatient Diabetes Program Recommendations:    Increase Lantus to 34 units QHS Add Novolog 4 units tidwc for meal coverage insulin.  Continue to follow.  Thank you. Lorenda Peck, RD, LDN, CDE Inpatient Diabetes Coordinator [phone]

## 2016-03-13 NOTE — Progress Notes (Signed)
Patient ID: Danny Fuller, male   DOB: 11-13-1975, 41 y.o.   MRN: [id number] 4 Days Post-Op   Subjective: Had a bowel movement overnight. Pain continues to improve.  Objective: Vital signs in last 24 hours: Temp:  [97.8 F (36.6 C)-98.1 F (36.7 C)] 98.1 F (36.7 C) (01/29 0602) Pulse Rate:  [90-99] 90 (01/29 0602) Resp:  [10-19] 18 (01/29 0602) BP: (134-171)/(78-96) 137/79 (01/29 0602) SpO2:  [99 %-100 %] 99 % (01/29 0602) Last BM Date: 03/13/16  Intake/Output from previous day: 01/28 0701 - 01/29 0700 In: 1110 [P.O.:360; I.V.:50; IV Piggyback:700] Out: 3225 [Urine:3225] Intake/Output this shift: Total I/O In: -  Out: 800 [Urine:800]  General: alert, cooperative, no distress -Wound: wound edges are clean and beefy pink, there is tan tissue at the base. Wound probed digitally without any purulence expressed and minimal pain. Mild induration along the right medial gluteus, minimally tender.   Lab Results:   Recent Labs  03/12/16 0645 03/13/16 0353  WBC 16.3* 14.3*  HGB 8.0* 8.0*  HCT 22.5* 23.0*  PLT 548* 592*   BMET  Recent Labs  03/12/16 0645 03/13/16 0353  NA 130* 127*  K 3.8 4.5  CL 98* 95*  CO2 25 24  GLUCOSE 171* 231*  BUN 7 8  CREATININE 0.70 0.86  CALCIUM 7.9* 7.9*     Studies/Results: No results found.  Anti-infectives: Anti-infectives    Start     Dose/Rate Route Frequency Ordered Stop   03/10/16 1500  vancomycin (VANCOCIN) IVPB 1000 mg/200 mL premix     1,000 mg 200 mL/hr over 60 Minutes Intravenous Every 8 hours 03/10/16 1418     03/08/16 0200  vancomycin (VANCOCIN) IVPB 750 mg/150 ml premix  Status:  Discontinued     750 mg 150 mL/hr over 60 Minutes Intravenous Every 12 hours 03/07/16 1212 03/10/16 1418   03/07/16 1800  clindamycin (CLEOCIN) IVPB 900 mg     900 mg 100 mL/hr over 30 Minutes Intravenous Every 8 hours 03/07/16 1656     03/07/16 1400  meropenem (MERREM) 1 g in sodium chloride 0.9 % 100 mL IVPB     1 g 200 mL/hr over  30 Minutes Intravenous Every 8 hours 03/07/16 1212     03/07/16 1300  vancomycin (VANCOCIN) 2,000 mg in sodium chloride 0.9 % 500 mL IVPB     2,000 mg 250 mL/hr over 120 Minutes Intravenous  Once 03/07/16 1212 03/07/16 1456   03/05/16 1100  ceFEPIme (MAXIPIME) 2 g in dextrose 5 % 50 mL IVPB  Status:  Discontinued     2 g 100 mL/hr over 30 Minutes Intravenous Every 12 hours 03/05/16 0829 03/05/16 1003   03/05/16 1030  ceFEPIme (MAXIPIME) 1 g in dextrose 5 % 50 mL IVPB  Status:  Discontinued     1 g 100 mL/hr over 30 Minutes Intravenous Every 8 hours 03/05/16 1003 03/07/16 1140   03/05/16 0030  ceFEPIme (MAXIPIME) 2 g in dextrose 5 % 50 mL IVPB     2 g 100 mL/hr over 30 Minutes Intravenous  Once 03/05/16 0027 03/05/16 0157   03/04/16 2315  cefTRIAXone (ROCEPHIN) 2 g in dextrose 5 % 50 mL IVPB     2 g 100 mL/hr over 30 Minutes Intravenous  Once 03/04/16 2314 03/04/16 2357      Assessment/Plan: s/p Procedure(s): EXAM UNDER ANESTHESIA, IRRIGATION AND DEBRIDEMENT PERIRECTAL ABSCESS Appears to be improving with resolving pain and decreasing WBC. Wound looks okay. Continue current treatment.   LOS: 9  days    Danny Fuller 03/13/2016

## 2016-03-14 ENCOUNTER — Encounter (HOSPITAL_COMMUNITY): Payer: Self-pay

## 2016-03-14 DIAGNOSIS — Z833 Family history of diabetes mellitus: Secondary | ICD-10-CM

## 2016-03-14 DIAGNOSIS — Z794 Long term (current) use of insulin: Secondary | ICD-10-CM

## 2016-03-14 DIAGNOSIS — N493 Fournier gangrene: Secondary | ICD-10-CM

## 2016-03-14 DIAGNOSIS — Z89429 Acquired absence of other toe(s), unspecified side: Secondary | ICD-10-CM

## 2016-03-14 DIAGNOSIS — Z8349 Family history of other endocrine, nutritional and metabolic diseases: Secondary | ICD-10-CM

## 2016-03-14 DIAGNOSIS — Z8261 Family history of arthritis: Secondary | ICD-10-CM

## 2016-03-14 DIAGNOSIS — L97509 Non-pressure chronic ulcer of other part of unspecified foot with unspecified severity: Secondary | ICD-10-CM

## 2016-03-14 DIAGNOSIS — Z8249 Family history of ischemic heart disease and other diseases of the circulatory system: Secondary | ICD-10-CM

## 2016-03-14 DIAGNOSIS — B9689 Other specified bacterial agents as the cause of diseases classified elsewhere: Secondary | ICD-10-CM

## 2016-03-14 DIAGNOSIS — F17211 Nicotine dependence, cigarettes, in remission: Secondary | ICD-10-CM

## 2016-03-14 DIAGNOSIS — Z823 Family history of stroke: Secondary | ICD-10-CM

## 2016-03-14 DIAGNOSIS — E1121 Type 2 diabetes mellitus with diabetic nephropathy: Secondary | ICD-10-CM

## 2016-03-14 DIAGNOSIS — E11621 Type 2 diabetes mellitus with foot ulcer: Secondary | ICD-10-CM

## 2016-03-14 DIAGNOSIS — Z8241 Family history of sudden cardiac death: Secondary | ICD-10-CM

## 2016-03-14 LAB — GLUCOSE, CAPILLARY
GLUCOSE-CAPILLARY: 137 mg/dL — AB (ref 65–99)
Glucose-Capillary: 195 mg/dL — ABNORMAL HIGH (ref 65–99)
Glucose-Capillary: 126 mg/dL — ABNORMAL HIGH (ref 65–99)
Glucose-Capillary: 152 mg/dL — ABNORMAL HIGH (ref 65–99)

## 2016-03-14 LAB — CULTURE, BLOOD (ROUTINE X 2)
Culture: NO GROWTH
Culture: NO GROWTH

## 2016-03-14 LAB — BASIC METABOLIC PANEL
Anion gap: 7 (ref 5–15)
BUN: 8 mg/dL (ref 6–20)
CALCIUM: 8.4 mg/dL — AB (ref 8.9–10.3)
CO2: 29 mmol/L (ref 22–32)
CREATININE: 0.74 mg/dL (ref 0.61–1.24)
Chloride: 96 mmol/L — ABNORMAL LOW (ref 101–111)
GLUCOSE: 206 mg/dL — AB (ref 65–99)
Potassium: 4.3 mmol/L (ref 3.5–5.1)
Sodium: 132 mmol/L — ABNORMAL LOW (ref 135–145)

## 2016-03-14 LAB — CBC
HEMATOCRIT: 23.8 % — AB (ref 39.0–52.0)
Hemoglobin: 8.2 g/dL — ABNORMAL LOW (ref 13.0–17.0)
MCH: 29.7 pg (ref 26.0–34.0)
MCHC: 34.5 g/dL (ref 30.0–36.0)
MCV: 86.2 fL (ref 78.0–100.0)
PLATELETS: 679 10*3/uL — AB (ref 150–400)
RBC: 2.76 MIL/uL — ABNORMAL LOW (ref 4.22–5.81)
RDW: 13.5 % (ref 11.5–15.5)
WBC: 11.5 10*3/uL — ABNORMAL HIGH (ref 4.0–10.5)

## 2016-03-14 MED ORDER — PREMIER PROTEIN SHAKE
11.0000 [oz_av] | Freq: Two times a day (BID) | ORAL | Status: DC
  Administered 2016-03-14: 11 [oz_av] via ORAL
  Filled 2016-03-14 (×4): qty 325.31

## 2016-03-14 MED ORDER — INSULIN GLARGINE 100 UNIT/ML ~~LOC~~ SOLN
36.0000 [IU] | Freq: Every day | SUBCUTANEOUS | Status: DC
  Administered 2016-03-14: 36 [IU] via SUBCUTANEOUS
  Filled 2016-03-14 (×2): qty 0.36

## 2016-03-14 MED ORDER — INSULIN ASPART 100 UNIT/ML ~~LOC~~ SOLN
6.0000 [IU] | Freq: Three times a day (TID) | SUBCUTANEOUS | Status: DC
  Administered 2016-03-14 – 2016-03-15 (×3): 6 [IU] via SUBCUTANEOUS

## 2016-03-14 MED ORDER — PIPERACILLIN-TAZOBACTAM 3.375 G IVPB
3.3750 g | Freq: Three times a day (TID) | INTRAVENOUS | Status: DC
  Administered 2016-03-14 – 2016-03-15 (×4): 3.375 g via INTRAVENOUS
  Filled 2016-03-14 (×3): qty 50

## 2016-03-14 NOTE — Progress Notes (Signed)
Physical Therapy Treatment Patient Details Name: Danny Fuller MRN: [id number] DOB: 09-02-1975 Today's Date: 03/14/2016    History of Present Illness 41 yo male admitted with perirectal abcess, S/P Iand D . H/O DM    PT Comments    The patient is much stronger and ambulating with IV pole and no assist x 440'. Continue PT.  Follow Up Recommendations  No PT follow up     Equipment Recommendations  None recommended by PT    Recommendations for Other Services       Precautions / Restrictions Precautions Precautions: None    Mobility  Bed Mobility Overal bed mobility: Independent                Transfers Overall transfer level: Needs assistance Equipment used: None Transfers: Sit to/from Stand Sit to Stand: Supervision         General transfer comment: no support nreeded  Ambulation/Gait Ambulation/Gait assistance: Supervision Ambulation Distance (Feet): 440 Feet   Gait Pattern/deviations: Step-through pattern     General Gait Details: IV pole   Stairs            Wheelchair Mobility    Modified Rankin (Stroke Patients Only)       Balance Overall balance assessment: Independent                                  Cognition Arousal/Alertness: Awake/alert                          Exercises      General Comments        Pertinent Vitals/Pain Pain Assessment: No/denies pain    Home Living                      Prior Function            PT Goals (current goals can now be found in the care plan section) Progress towards PT goals: Progressing toward goals    Frequency    Min 3X/week      PT Plan Current plan remains appropriate    Co-evaluation             End of Session   Activity Tolerance: Patient tolerated treatment well Patient left:  (standing at the bed)     Time: [id number] PT Time Calculation (min) (ACUTE ONLY): 15 min  Charges:  $Gait Training: 8-22 mins                     G Codes:      [name] 03/14/2016, 4:47 PM

## 2016-03-14 NOTE — Progress Notes (Signed)
Inpatient Diabetes Program Recommendations  AACE/ADA: New Consensus Statement on Inpatient Glycemic Control (2015)  Target Ranges:  Prepandial:   less than 140 mg/dL      Peak postprandial:   less than 180 mg/dL (1-2 hours)      Critically ill patients:  140 - 180 mg/dL   Lab Results  Component Value Date   GLUCAP 195 (H) 03/14/2016   HGBA1C 11.6 (H) 03/06/2016    Review of Glycemic Control  FBS and post-prandial blood sugars elevated. Insulin titration.  Inpatient Diabetes Program Recommendations:    Increase Lantus to 36 units QHS Increase Novolog to 6 units tidwc.  Continue to follow.  Thank you. Lorenda Peck, RD, LDN, CDE Inpatient Diabetes Coordinator [phone]

## 2016-03-14 NOTE — Progress Notes (Signed)
Pharmacy Antibiotic Note  Danny Fuller is a 41 y.o. 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 Abd CT on 1/23 showed necrotizing infection of the soft tissues of the medial right buttocks and right perineum consistent with Fourniere's gangrene.  He is s/p I&D 1/23 and digital exploration on 1/25 with drainage of bloody, tan pus.  Patient's currently on vancomycin, merrem, and clindamycin.  To change abx to zosyn for 14 days with 1/26 as day #1 per MD's request.  Today, 03/14/2016: - afeb, wbc elevated but trending down -  scr stable (crcl~100)   Plan: - zosyn 3.375 gm IV q8h (infuse over 4 hrs) with stop date on 03/23/16 - with stable renal function, pharmacy will sign off for zosyn. Re-consult [name] if needed further assistance. _____________________________________  Height: 5\' 9"  (175.3 cm) Weight: 255 lb 11.7 oz (116 kg) IBW/kg (Calculated) : 70.7  Temp (24hrs), Avg:98.3 F (36.8 C), Min:97.9 F (36.6 C), Max:98.6 F (37 C)   Recent Labs Lab 03/10/16 0344 03/10/16 1253 03/11/16 0342 03/12/16 0645 03/12/16 1318 03/13/16 0353 03/14/16 0411  WBC 25.2*  --  22.3* 16.3*  --  14.3* 11.5*  CREATININE 0.99  --  0.70 0.70  --  0.86 0.74  VANCOTROUGH  --  8*  --   --  17  --   --     Estimated Creatinine Clearance: 157.3 mL/min (by C-G formula based on SCr of 0.74 mg/dL).    No Known Allergies   Antimicrobials this admission:  Ceftriaxone 1/20 x1 Cefepime 1/21 >> 1/23 1/23 Vanc Rx>>1/30 1/23 meropenem Rx>>1/30 1/23 clindamycin >>1/30 1/30 zosyn>> 2/8   Dose adjustments this admission:  1/26 1300 VT = 8 mcg/ml on 750mg  IV q12h (prior to 6th dose), increase dose to 1gm q8h  1/28 0500 VT = 17 on 1g q8h  Microbiology results:  1/20 BCx x2: NGF 1/20 UCx: ngf 1/23 MRSA PCR neg 1/23 buttock wound cx (aerobic): NG FINAL 1/23 buttock wound cx (anaerobic): mixed anaerobic flora present FINAL 1/25 BCx x2: ngtd   Thank you for allowing pharmacy to be a  part of this patient's care.  Dia Sitter, PharmD, BCPS 03/14/2016 9:59 AM

## 2016-03-14 NOTE — Progress Notes (Signed)
Patient ID: Danny Fuller, male   DOB: [birth year], 41 y.o.   MRN: [id number] 5 Days Post-Op   Subjective: No acute issues  Objective: Vital signs in last 24 hours: Temp:  [97.9 F (36.6 C)-98.6 F (37 C)] 98.2 F (36.8 C) (01/30 0754) Pulse Rate:  [87-97] 87 (01/30 0754) Resp:  [18] 18 (01/30 0414) BP: (120-158)/(75-89) 149/89 (01/30 0754) SpO2:  [100 %] 100 % (01/30 0754) Last BM Date: 03/13/16  Intake/Output from previous day: 01/29 0701 - 01/30 0700 In: 1740 [P.O.:840; IV Piggyback:900] Out: [id number] [Urine:5195] Intake/Output this shift: No intake/output data recorded.  General: alert, cooperative, no distress -Wound: wound edges are clean and beefy pink, there is tan tissue at the base.  Mild and improving induration along the right medial gluteus, minimally tender.   Lab Results:   Recent Labs  03/13/16 0353 03/14/16 0411  WBC 14.3* 11.5*  HGB 8.0* 8.2*  HCT 23.0* 23.8*  PLT 592* 679*   BMET  Recent Labs  03/13/16 0353 03/14/16 0411  NA 127* 132*  K 4.5 4.3  CL 95* 96*  CO2 24 29  GLUCOSE 231* 206*  BUN 8 8  CREATININE 0.86 0.74  CALCIUM 7.9* 8.4*     Studies/Results: No results found.  Anti-infectives: Anti-infectives    Start     Dose/Rate Route Frequency Ordered Stop   03/14/16 1200  piperacillin-tazobactam (ZOSYN) IVPB 3.375 g     3.375 g 12.5 mL/hr over 240 Minutes Intravenous Every 8 hours 03/14/16 0855 03/23/16 2300   03/10/16 1500  vancomycin (VANCOCIN) IVPB 1000 mg/200 mL premix  Status:  Discontinued     1,000 mg 200 mL/hr over 60 Minutes Intravenous Every 8 hours 03/10/16 1418 03/14/16 0826   03/08/16 0200  vancomycin (VANCOCIN) IVPB 750 mg/150 ml premix  Status:  Discontinued     750 mg 150 mL/hr over 60 Minutes Intravenous Every 12 hours 03/07/16 1212 03/10/16 1418   03/07/16 1800  clindamycin (CLEOCIN) IVPB 900 mg  Status:  Discontinued     900 mg 100 mL/hr over 30 Minutes Intravenous Every 8 hours 03/07/16 1656 03/14/16 0826    03/07/16 1400  meropenem (MERREM) 1 g in sodium chloride 0.9 % 100 mL IVPB  Status:  Discontinued     1 g 200 mL/hr over 30 Minutes Intravenous Every 8 hours 03/07/16 1212 03/14/16 0826   03/07/16 1300  vancomycin (VANCOCIN) 2,000 mg in sodium chloride 0.9 % 500 mL IVPB     2,000 mg 250 mL/hr over 120 Minutes Intravenous  Once 03/07/16 1212 03/07/16 1456   03/05/16 1100  ceFEPIme (MAXIPIME) 2 g in dextrose 5 % 50 mL IVPB  Status:  Discontinued     2 g 100 mL/hr over 30 Minutes Intravenous Every 12 hours 03/05/16 0829 03/05/16 1003   03/05/16 1030  ceFEPIme (MAXIPIME) 1 g in dextrose 5 % 50 mL IVPB  Status:  Discontinued     1 g 100 mL/hr over 30 Minutes Intravenous Every 8 hours 03/05/16 1003 03/07/16 1140   03/05/16 0030  ceFEPIme (MAXIPIME) 2 g in dextrose 5 % 50 mL IVPB     2 g 100 mL/hr over 30 Minutes Intravenous  Once 03/05/16 0027 03/05/16 0157   03/04/16 2315  cefTRIAXone (ROCEPHIN) 2 g in dextrose 5 % 50 mL IVPB     2 g 100 mL/hr over 30 Minutes Intravenous  Once 03/04/16 2314 03/04/16 2357      Assessment/Plan: s/p Procedure(s): EXAM UNDER ANESTHESIA, IRRIGATION AND DEBRIDEMENT  PERIRECTAL ABSCESS Appears to be improving, WBC continues to trend down. Would switch to PO abx at this point. Continue current treatment.    LOS: 10 days    Danny Fuller 03/14/2016

## 2016-03-14 NOTE — Progress Notes (Signed)
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 He met sepsis criteria on admission, code sepsis was activated and started on IV cefepime for presumed acute pyelonephritis. CT abdomen and pelvis showed right pyelonephritis. Blood and urine cultures were negative but patient failed to improve on IV cefepime and continued to have fevers. Right flank and abdominal pain resolved. On 1/23, complained of acute right buttock pain with drainage. Repeat imaging confirmed Fournier's gangrene, Gen. surgery performed I&D in the OR on 1/23, postop he developed hypotension and was transferred to stepdown unit. Hypotension resolved. On 1/25, more pain, persistent tachycardia, worsening leukocytosis, concern for residual infection and hence CCS the patient back to OR on 1/25 and underwent manual digital exploration with evacuation of bloody tan pus. Clinically improved. Transferred to medical bed 1/28   Assessment & Plan:   Principal Problem:   Necrotizing fasciitis ([place]) Active Problems:   Essential hypertension   Sepsis ([place])   Diabetic polyneuropathy associated with type 2 diabetes mellitus ([place])   Pyelonephritis   1. Sepsis secondary to Fournier's gangrene: As indicated above, initially treated for presumed right-sided acute pyelonephritis based on clinical picture and CT abdomen. Blood cultures 2 from 1/20: Negative. Urine culture 1/20: Negative. Wound culture 1/23: No growth to date. Repeat blood cultures 1/25: Negative to date. Continued to spike fevers. On 1/23 new complaints of right buttock pain and drainage. CT  abdomen and pelvis confirmed Fournier's gangrene. General surgery was consulted and performed I&D in OR on 1/23. Postop developed hypotension and was transferred to stepdown unit for close monitoring and management. Hypotension resolved with IV fluids. Antibiotics were broadened to IV meropenem, vancomycin and clindamycin (has been on since 03/07/16). On 1/25, more pain, persistent tachycardia, worsening leukocytosis, concern for residual infection and hence CCS took back to OR on 1/25 and underwent manual digital exploration with evacuation of bloody tan pus. Wound care per surgery. Continue hydrotherapy. Slowly improving >less pain, no high fevers since 1/25 and leukocytosis almost resolved. MRSA screen negative. Discussed with infectious disease M.D. on call. on 03/14/16 who recommended stopping Meropenem, Vancomycin and Clindamycin and switching to IV Zosyn to complete total 14 days treatment (start date 03/10/16) followed by consideration of oral antibiotics. Discussed with patient regarding this plan and PICC line. He seems agreeable but wishes to see ID MD in formal consult prior to allowing PICC line (states that Dr. Michel Bickers is his ID MD). 2. 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 CBGs mildly uncontrolled . Uncontrolled DM related to stress response/infection. Monitor closely and may need further titration of insulins and or addition of mealtime NovoLog. Hemoglobin A1c: 11.6. Continue Lyrica. Tramadol discontinued. Diabetes coordinator input appreciated. Increase NovoLog 6 units 3 times a day with meals. 3. Essential hypertension: Transient postop hypotension 1/23 has resolved. Pharmacy reviewed home medications again on 1/24. He does not take labetalol and was discontinued. Continue amlodipine, clonidine, hydralazine and lisinopril. Fluctuating and mildly uncontrolled at times. Continue current regimen without  change. 4. Sinus tachycardia: Due to problem #1 and pain. Resolved. 5. Anemia: Baseline hemoglobin may be in the 10-11 range.  Hemoglobin was in the mid 8 g per DL range for the last couple of days but has dropped to 7.8>7.7 on 1/27 >likely secondary to acute illness/infection and some degree of surgical blood loss. Follow CBCs daily and transfuse if hemoglobin <7 g per DL.  Hb 8 and stable for the last 3 days. 6. Leukocytosis: Secondary to sepsis. Management as above. Follow CBCs daily. Almost resolved. 7. Reactive thrombocytosis: Periodically follow CBCs. 8. Hypokalemia: replaced. Mg 1.7 9. Hyponatremia: Stable. Likely multifactorial secondary to? SIADH and hyperglycemia. Clinically euvolemic. Follow BMP periodically.    DVT prophylaxis: Lovenox Code Status: Full Family Communication: None at bedside. On 1/29, advised patient to have his spouse call if wished to speak with [name]. He indicated that he would update her on his ongoing care. Disposition Plan: Transferred to stepdown unit on 1/23. Transfer to medical 1/28. DC home in 1-2 days, pending surgery clearance.   Consultants:   General surgery  Procedures:   I&D of perirectal/perineal abscess by general surgery on 03/07/16  On 1/25: Exam under anesthesia with opening on the right near base of scrotum;  Manual digital exploration with evacuation of bloody tan pus  Antimicrobials:   IV cefepime 1/20 > 1/23  IV clindamycin 1/23 >1/30  IV meropenem 1/23 >1/30  IV vancomycin 1/23 > 1/30  IV Zosyn 1/30>   Subjective: Continues to do better. Mild pain which is more during dressing change. Continues to ambulate in the room. Having BM. Tolerating diet. Denies any other complaints.  Objective:  Vitals:   03/13/16 2013 03/14/16 0414 03/14/16 0754 03/14/16 0955  BP: 138/75 (!) 158/86 (!) 149/89 140/80  Pulse: 91 88 87   Resp: 18 18    Temp: 97.9 F (36.6 C) 98.3 F (36.8 C) 98.2 F (36.8 C)   TempSrc: Oral Oral Oral   SpO2:  100% 100% 100%   Weight:      Height:        Intake/Output Summary (Last 24 hours) at 03/14/16 1114 Last data filed at 03/14/16 0700  Gross per 24 hour  Intake             1740 ml  Output             3295 ml  Net            -1555 ml   Filed Weights   03/04/16 1549 03/07/16 2200  Weight: 108 kg (238 lb) 116 kg (255 lb 11.7 oz)    Examination:  General exam: Pleasant young male lying comfortably supine in bed. Does not look septic or toxic. Respiratory system: Clear to auscultation. Respiratory effort normal. Cardiovascular system: S1 & S2 heard, RRR. No JVD, murmurs, rubs, gallops or clicks. No pedal edema.  Gastrointestinal system: Abdomen is nondistended, soft and nontender. No organomegaly or masses felt. Normal bowel sounds heard. Central nervous system: Alert and oriented. No focal neurological deficits. Extremities: Symmetric 5 x 5 power. Skin: No rashes, lesions or ulcers. Sacral area dressing clean and dry. Psychiatry: Judgement and insight appear normal. Mood & affect appropriate.     Data Reviewed: I have personally reviewed following labs and imaging studies  CBC:  Recent Labs Lab 03/08/16 0321 03/09/16 0350 03/10/16 0344 03/11/16 0342 03/12/16 0645 03/13/16 0353 03/14/16 0411  WBC 25.1* 26.2* 25.2* 22.3* 16.3* 14.3* 11.5*  NEUTROABS 21.3* 22.0* 20.9*  --   --   --   --   HGB 8.5* 8.8* 7.8* 7.7* 8.0* 8.0* 8.2*  HCT 25.1* 25.4* 22.2*  21.9* 22.5* 23.0* 23.8*  MCV 84.2 85.5 85.4 85.5 84.9 85.8 86.2  PLT 307 370 393 447* 548* 592* 355*   Basic Metabolic Panel:  Recent Labs Lab 03/10/16 0344 03/11/16 0342 03/12/16 0645 03/13/16 0353 03/14/16 0411  NA 130* 129* 130* 127* 132*  K 3.7 3.4* 3.8 4.5 4.3  CL 102 99* 98* 95* 96*  CO2 21* 24 25 24 29   GLUCOSE 268* 193* 171* 231* 206*  BUN 11 9 7 8 8   CREATININE 0.99 0.70 0.70 0.86 0.74  CALCIUM 7.6* 7.6* 7.9* 7.9* 8.4*  MG  --   --  1.7  --   --    GFR: Estimated Creatinine Clearance: 157.3 mL/min  (by C-G formula based on SCr of 0.74 mg/dL). Liver Function Tests: No results for input(s): AST, ALT, ALKPHOS, BILITOT, PROT, ALBUMIN in the last 168 hours. No results for input(s): LIPASE, AMYLASE in the last 168 hours. No results for input(s): AMMONIA in the last 168 hours. Coagulation Profile: No results for input(s): INR, PROTIME in the last 168 hours. Cardiac Enzymes: No results for input(s): CKTOTAL, CKMB, CKMBINDEX, TROPONINI in the last 168 hours. BNP (last 3 results) No results for input(s): PROBNP in the last 8760 hours. HbA1C: No results for input(s): HGBA1C in the last 72 hours. CBG:  Recent Labs Lab 03/13/16 0901 03/13/16 1224 03/13/16 1758 03/13/16 2032 03/14/16 0811  GLUCAP 183* 190* 211* 233* 195*   Lipid Profile: No results for input(s): CHOL, HDL, LDLCALC, TRIG, CHOLHDL, LDLDIRECT in the last 72 hours. Thyroid Function Tests: No results for input(s): TSH, T4TOTAL, FREET4, T3FREE, THYROIDAB in the last 72 hours. Anemia Panel: No results for input(s): VITAMINB12, FOLATE, FERRITIN, TIBC, IRON, RETICCTPCT in the last 72 hours.  Sepsis Labs:  Recent Labs Lab 03/11/16 0342 03/12/16 0645 03/13/16 0353 03/14/16 0411  WBC 22.3* 16.3* 14.3* 11.5*    Recent Results (from the past 240 hour(s))  Urine culture     Status: None   Collection Time: 03/04/16  9:50 PM  Result Value Ref Range Status   Specimen Description URINE, CLEAN CATCH  Final   Special Requests NONE  Final   Culture   Final    NO GROWTH Performed at [facility], [place] [address], [place], [place] [address]    Report Status 03/06/2016 FINAL  Final  Blood Culture (routine x 2)     Status: None   Collection Time: 03/04/16 11:18 PM  Result Value Ref Range Status   Specimen Description BLOOD RIGHT ANTECUBITAL  Final   Special Requests BOTTLES DRAWN AEROBIC AND ANAEROBIC 6CC  Final   Culture   Final    NO GROWTH 5 DAYS Performed at [facility], [place] [address], [place], [place]  [address]    Report Status 03/10/2016 FINAL  Final  Blood Culture (routine x 2)     Status: None   Collection Time: 03/04/16 11:18 PM  Result Value Ref Range Status   Specimen Description BLOOD RIGHT HAND  Final   Special Requests BOTTLES DRAWN AEROBIC AND ANAEROBIC 6 CC  Final   Culture   Final    NO GROWTH 5 DAYS Performed at [facility], [place] [address], [place], [place] [address]    Report Status 03/10/2016 FINAL  Final  Anaerobic culture     Status: None   Collection Time: 03/07/16  7:07 PM  Result Value Ref Range Status   Specimen Description WOUND  Final   Special Requests  Final    RIGHT BUTTOCKS Performed at [facility], [place] [address] [place], [place] [address]    Culture   Final    MIXED ANAEROBIC FLORA PRESENT.  CALL LAB IF FURTHER IID REQUIRED.   Report Status 03/12/2016 FINAL  Final  Aerobic Culture (superficial specimen)     Status: None   Collection Time: 03/07/16  7:07 PM  Result Value Ref Range Status   Specimen Description WOUND  Final   Special Requests RIGHT BUTTOCKS  Final   Gram Stain   Final    RARE WBC PRESENT, PREDOMINANTLY PMN ABUNDANT GRAM POSITIVE RODS MODERATE GRAM POSITIVE COCCI IN CLUSTERS MODERATE GRAM NEGATIVE RODS    Culture   Final    NO GROWTH 2 DAYS Performed at [facility], [address] [place], [place] [address]    Report Status 03/10/2016 FINAL  Final  MRSA PCR Screening     Status: None   Collection Time: 03/07/16 10:00 PM  Result Value Ref Range Status   MRSA by PCR NEGATIVE NEGATIVE Final    Comment:        The GeneXpert MRSA Assay (FDA approved for NASAL specimens only), is one component of a comprehensive MRSA colonization surveillance program. It is not intended to diagnose MRSA infection nor to guide or monitor treatment for MRSA infections.   Culture, blood (Routine X 2) w Reflex to ID Panel     Status: None (Preliminary result)   Collection Time: 03/09/16 12:01 PM  Result Value Ref Range  Status   Specimen Description BLOOD RIGHT ARM  Final   Special Requests BOTTLES DRAWN AEROBIC AND ANAEROBIC 5CC  Final   Culture   Final    NO GROWTH 4 DAYS Performed at [facility], [place] [address] [place], [place] [address]    Report Status PENDING  Incomplete  Culture, blood (Routine X 2) w Reflex to ID Panel     Status: None (Preliminary result)   Collection Time: 03/09/16 12:02 PM  Result Value Ref Range Status   Specimen Description BLOOD RIGHT HAND  Final   Special Requests IN PEDIATRIC BOTTLE 3CC  Final   Culture   Final    NO GROWTH 4 DAYS Performed at [facility], [place] [address], [place],  [address]    Report Status PENDING  Incomplete         Radiology Studies: No results found.      Scheduled Meds: . amLODipine  10 mg Oral Daily  . cloNIDine  0.1 mg Oral BID  . enoxaparin (LOVENOX) injection  50 mg Subcutaneous QHS  . hydrALAZINE  25 mg Oral Q8H  . insulin aspart  0-15 Units Subcutaneous TID WC  . insulin aspart  0-5 Units Subcutaneous QHS  . insulin aspart  4 Units Subcutaneous TID WC  . insulin glargine  34 Units Subcutaneous Q2200  . lisinopril  20 mg Oral Daily  . piperacillin-tazobactam (ZOSYN)  IV  3.375 g Intravenous Q8H  . pregabalin  75 mg Oral BID  . sodium chloride flush  3 mL Intravenous Q12H   Continuous Infusions:    LOS: 10 days        HONGALGI,ANAND, MD [facility] Pager [phone]  If 7PM-7AM, please contact night-coverage [email or web address] Password [email or web address] 03/14/2016, 11:14 AM

## 2016-03-14 NOTE — Progress Notes (Signed)
03/14/16 [id number] Hydro therapy treatment note  Subjective Assessment1414  Patient and Family Stated Goals to heal this wound  Date of Onset 03/04/16  Prior Treatments I and D x 2  Wound / Incision (Open or Dehisced) 03/10/16 Incision - Open Perineum Right;Medial open surgical wound perirectal on the right  buttock  Date First Assessed/Time First Assessed: 03/10/16 0815   Wound Type: Incision - Open  Location: Perineum  Location Orientation: Right;Medial  Wound Description (Comments): open surgical wound perirectal on the right  buttock  Present on Admission: No  Dressing Type ABD;Gauze (Comment);Moist to moist;Mesh briefs (packed with iodoform today)  Dressing Changed New  Dressing Status Clean;Dry  Dressing Change Frequency Twice a day  Site / Wound Assessment Clean;Granulation tissue;Painful;Pink;Yellow  % Wound base Red or Granulating 60%  % Wound base Yellow/Fibrinous Exudate 40%- increased slough today (unable to visualize wound base)  Peri-wound Assessment Intact;Edema;Induration  Wound Length (cm) 4.5 cm  Wound Width (cm) 5 cm  Wound Depth (cm) 4.5 cm  Tunneling (cm) 5 (tunnels at 5:00, at 1:00, )  Margins Unattached edges (unapproximated)  Drainage Amount Moderate  Drainage Description Serosanguineous;Purulent  Non-staged Wound Description Not applicable  Treatment Debridement (Selective);Hydrotherapy (Pulse lavage);Packing (Impregnated strip);Packing (Dry gauze)  Incision (Closed) 03/07/16 Rectum Right  Date First Assessed/Time First Assessed: 03/07/16 1900   Location: Rectum  Location Orientation: Right  Dressing Type (iodoform packing)4x4, ABD mesh brief  Hydrotherapy  Pulsed lavage therapy - wound location perirectal on the right  Pulsed Lavage with Suction (psi) 8 psi  Pulsed Lavage with Suction - Normal Saline Used 500 mL  Pulsed Lavage Tip Tip with splash shield (and tunnel tip)  Selective Debridement  Selective Debridement - Location perirectal wound   Selective Debridement - Tools Used Forceps;Scissors  Selective Debridement - Tissue Removed fibrinous tissue and slough  Wound Therapy - Assess/Plan/Recommendations  Wound Therapy - Clinical Statement wound is with incr yellow slough, sharp debrided. Small"polyp-like" hypergranulation tissue  at 6:00. Will continue to follow for hydrotherapy  Wound Therapy - Functional Problem List the patient is independent in bed mobility. will benefit from ambulation   Factors Delaying/Impairing Wound Healing Diabetes Mellitus  Hydrotherapy Plan Debridement;Dressing change;Pulsatile lavage with suction;Patient/family education  Wound Therapy - Frequency 6X / week  Wound Therapy - Current Recommendations PT  Wound Therapy - Follow Up Recommendations Home health RN  Wound Plan PLS 6 x week  Wound Therapy Goals - Improve the function of patient's integumentary system by progressing the wound(s) through the phases of wound healing by:  Decrease Necrotic Tissue to 0  Decrease Necrotic Tissue - Progress Progressing toward goal  Increase Granulation Tissue to 100  Increase Granulation Tissue - Progress Progressing toward goal  Decrease Length/Width/Depth by (cm) 2 cm  Decrease Length/Width/Depth - Progress Progressing toward goal  Improve Drainage Characteristics Min;Serous  Improve Drainage Characteristics - Progress Progressing toward goal  Patient/Family will be able to  reposition self  Patient/Family Instruction Goal - Progress Met  Goals/treatment plan/discharge plan were made with and agreed upon by patient/family Yes  Time For Goal Achievement 2 weeks  Wound Therapy - Potential for Goals Excellent   Tresa Endo PT [phone]

## 2016-03-14 NOTE — Consult Note (Signed)
[facility]  Total days of antibiotics 12        Day 7 clinda/meropenem/vanco       Reason for Consult:  fourniers gangrene  Referring Physician:  [email or web address]  Principal Problem:   Necrotizing fasciitis ([place]) Active Problems:   Essential hypertension   Sepsis ([place])   Diabetic polyneuropathy associated with type 2 diabetes mellitus ([place])   Pyelonephritis    HPI: Danny Fuller is a 41 y.o. male with IDDM c/b diabetic nephropathy, neuropathy, s/p toe amputation, diabetic foot ulcer, who is followed by my partner, Dr Megan Salon. [name] was admitted on 1/21 for right sided flank pain and pelvis pressure with urinary frequency. He also noted to have fevers and difficult to control blood sugars. In the ED, He was found to have WBC of 20K, UA was bland but his CT did show some mild right kidney perinephric edema. He was started on abtx for presumed pyelonephritis but he persisted to have ongoing fevers despite abtx. 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   I have reviewed images independently it appears to have inflammation to right gluteal area c/w necrotizing fasciitis  His abtx were changed to vanco, meropenem plus clindamycin and subsequently went to the OR on 1/23 for I x D of perianal abscess ( roughly 7 x 6 cm area of debridement).OR cx were polymicrobial on gram stain but no growth. He went for second exploration and evacuation on 1/25, where his fevers resolved on 1/26. His WBC has nearly normalized, now at 11.5K. All micro cultures are negative  Past Medical History:  Diagnosis Date  . Asthma   . Diabetes mellitus    sees Dr. Dwyane Dee   . GERD (gastroesophageal reflux disease)   . Headache(784.0)   . Hypertension     Allergies: No Known Allergies  MEDICATIONS: . amLODipine  10 mg Oral Daily  . cloNIDine  0.1 mg Oral BID  . enoxaparin (LOVENOX) injection  50 mg Subcutaneous QHS  .  hydrALAZINE  25 mg Oral Q8H  . insulin aspart  0-15 Units Subcutaneous TID WC  . insulin aspart  0-5 Units Subcutaneous QHS  . insulin aspart  6 Units Subcutaneous TID WC  . insulin glargine  36 Units Subcutaneous Q2200  . lisinopril  20 mg Oral Daily  . piperacillin-tazobactam (ZOSYN)  IV  3.375 g Intravenous Q8H  . pregabalin  75 mg Oral BID  . protein supplement shake  11 oz Oral BID BM  . sodium chloride flush  3 mL Intravenous Q12H    Social History  Substance Use Topics  . Smoking status: Former Smoker    Types: Cigarettes  . Smokeless tobacco: Never Used     Comment: quit 5 days ago  . Alcohol use No    Family History  Problem Relation Age of Onset  . Arthritis    . Diabetes    . Hypertension    . Hyperlipidemia    . Stroke    . Sudden death    . Diabetes Mother   . Diabetes Sister   . Diabetes Brother      Review of Systems  Constitutional: Negative for fever, chills, diaphoresis, activity change, appetite change, fatigue and unexpected weight change.  HENT: Negative for congestion, sore throat, rhinorrhea, sneezing, trouble swallowing and sinus pressure.  Eyes: Negative for photophobia and visual disturbance.  Respiratory: Negative for cough, chest tightness, shortness of breath, wheezing  and stridor.  Cardiovascular: Negative for chest pain, palpitations and leg swelling.  Gastrointestinal: Negative for nausea, vomiting, abdominal pain, diarrhea, constipation, blood in stool, abdominal distention and anal bleeding.  Genitourinary: Negative for dysuria, hematuria, flank pain and difficulty urinating.  Musculoskeletal: Negative for myalgias, back pain, joint swelling, arthralgias and gait problem.  Skin: Negative for color change, pallor, rash and wound.  Neurological: Negative for dizziness, tremors, weakness and light-headedness.  Hematological: Negative for adenopathy. Does not bruise/bleed easily.  Psychiatric/Behavioral: Negative for behavioral problems,  confusion, sleep disturbance, dysphoric mood, decreased concentration and agitation.     OBJECTIVE: Temp:  [97.9 F (36.6 C)-98.6 F (37 C)] 98.2 F (36.8 C) (01/30 0754) Pulse Rate:  [87-97] 87 (01/30 0754) Resp:  [18] 18 (01/30 0414) BP: (120-158)/(75-89) 140/80 (01/30 0955) SpO2:  [100 %] 100 % (01/30 0754) Physical Exam  Constitutional: He is oriented to person, place, and time. He appears well-developed and well-nourished. No distress.  HENT:  Mouth/Throat: Oropharynx is clear and moist. No oropharyngeal exudate.  Cardiovascular: Normal rate, regular rhythm and normal heart sounds. Exam reveals no gallop and no friction rub.  No murmur heard.  Pulmonary/Chest: Effort normal and breath sounds normal. No respiratory distress. He has no wheezes.  Abdominal: Soft. Bowel sounds are normal. He exhibits no distension. There is no tenderness.  Lymphadenopathy:  He has no cervical adenopathy.  Neurological: He is alert and oriented to person, place, and time.  Skin: wound edges are clean and beefy pink,mild maceration on edges. there is tan tissue at the base.  there is induration along the right medial gluteus, minimally tender overall. Serous drainage on 4 x 4. Psych: normal affect LABS: Results for orders placed or performed during the hospital encounter of 03/04/16 (from the past 48 hour(s))  Glucose, capillary     Status: Abnormal   Collection Time: 03/12/16  4:14 PM  Result Value Ref Range   Glucose-Capillary 196 (H) 65 - 99 mg/dL  Glucose, capillary     Status: Abnormal   Collection Time: 03/12/16  9:35 PM  Result Value Ref Range   Glucose-Capillary 193 (H) 65 - 99 mg/dL   Comment 1 Notify RN   CBC     Status: Abnormal   Collection Time: 03/13/16  3:53 AM  Result Value Ref Range   WBC 14.3 (H) 4.0 - 10.5 K/uL   RBC 2.68 (L) 4.22 - 5.81 MIL/uL   Hemoglobin 8.0 (L) 13.0 - 17.0 g/dL   HCT 23.0 (L) 39.0 - 52.0 %   MCV 85.8 78.0 - 100.0 fL   MCH 29.9 26.0 - 34.0 pg   MCHC  34.8 30.0 - 36.0 g/dL   RDW 13.4 11.5 - 15.5 %   Platelets 592 (H) 150 - 400 K/uL  Basic metabolic panel     Status: Abnormal   Collection Time: 03/13/16  3:53 AM  Result Value Ref Range   Sodium 127 (L) 135 - 145 mmol/L   Potassium 4.5 3.5 - 5.1 mmol/L   Chloride 95 (L) 101 - 111 mmol/L   CO2 24 22 - 32 mmol/L   Glucose, Bld 231 (H) 65 - 99 mg/dL   BUN 8 6 - 20 mg/dL   Creatinine, Ser 0.86 0.61 - 1.24 mg/dL   Calcium 7.9 (L) 8.9 - 10.3 mg/dL   GFR calc non Af Amer >60 >60 mL/min   GFR calc Af Amer >60 >60 mL/min    Comment: (NOTE) The eGFR has been calculated using the CKD EPI equation.  This calculation has not been validated in all clinical situations. eGFR's persistently <60 mL/min signify possible Chronic Kidney Disease.    Anion gap 8 5 - 15  Glucose, capillary     Status: Abnormal   Collection Time: 03/13/16  9:01 AM  Result Value Ref Range   Glucose-Capillary 183 (H) 65 - 99 mg/dL  Glucose, capillary     Status: Abnormal   Collection Time: 03/13/16 12:24 PM  Result Value Ref Range   Glucose-Capillary 190 (H) 65 - 99 mg/dL  Glucose, capillary     Status: Abnormal   Collection Time: 03/13/16  5:58 PM  Result Value Ref Range   Glucose-Capillary 211 (H) 65 - 99 mg/dL  Glucose, capillary     Status: Abnormal   Collection Time: 03/13/16  8:32 PM  Result Value Ref Range   Glucose-Capillary 233 (H) 65 - 99 mg/dL  CBC     Status: Abnormal   Collection Time: 03/14/16  4:11 AM  Result Value Ref Range   WBC 11.5 (H) 4.0 - 10.5 K/uL   RBC 2.76 (L) 4.22 - 5.81 MIL/uL   Hemoglobin 8.2 (L) 13.0 - 17.0 g/dL   HCT 23.8 (L) 39.0 - 52.0 %   MCV 86.2 78.0 - 100.0 fL   MCH 29.7 26.0 - 34.0 pg   MCHC 34.5 30.0 - 36.0 g/dL   RDW 13.5 11.5 - 15.5 %   Platelets 679 (H) 150 - 400 K/uL  Basic metabolic panel     Status: Abnormal   Collection Time: 03/14/16  4:11 AM  Result Value Ref Range   Sodium 132 (L) 135 - 145 mmol/L   Potassium 4.3 3.5 - 5.1 mmol/L   Chloride 96 (L) 101 - 111  mmol/L   CO2 29 22 - 32 mmol/L   Glucose, Bld 206 (H) 65 - 99 mg/dL   BUN 8 6 - 20 mg/dL   Creatinine, Ser 0.74 0.61 - 1.24 mg/dL   Calcium 8.4 (L) 8.9 - 10.3 mg/dL   GFR calc non Af Amer >60 >60 mL/min   GFR calc Af Amer >60 >60 mL/min    Comment: (NOTE) The eGFR has been calculated using the CKD EPI equation. This calculation has not been validated in all clinical situations. eGFR's persistently <60 mL/min signify possible Chronic Kidney Disease.    Anion gap 7 5 - 15  Glucose, capillary     Status: Abnormal   Collection Time: 03/14/16  8:11 AM  Result Value Ref Range   Glucose-Capillary 195 (H) 65 - 99 mg/dL   Comment 1 Notify RN    Comment 2 Document in Chart   Glucose, capillary     Status: Abnormal   Collection Time: 03/14/16 12:02 PM  Result Value Ref Range   Glucose-Capillary 126 (H) 65 - 99 mg/dL   Comment 1 Notify RN    Comment 2 Document in Chart     MICRO: 1/23 mixed culture 1/25 blood cx ngtd 1/20 blood cx ngtd IMAGING: ct 1/23: There is inflammation and soft tissue gas present within the right medial buttocks primarily in the fat planes extending into the perineum near the base of the scrotum. These findings are consistent with necrotizing soft tissue infection, often termed Fourniere's gangrene. HISTORICAL MICRO/IMAGING  Assessment/Plan: 41yo M with IDDM with fournier's gangrene, currently on meropenem, vancomycin plus clindamycin- now on piptazo  - recommend that we can discharge on oral abtx and follow closely - plan to give 14 days of amox/clav 841m bid. (to finish  3 wk since debridement) No need for MRSA coverage, he is not colonized nor has had it in previous DFU infections - recommend minimum of twice a day dressing changes since he appears to be draining significantly. Defer to surgery/wound care recs - recommend home health to help with wound care dressing changes  - we will see back in  With Dr Megan Salon on Feb 15th.

## 2016-03-14 NOTE — Progress Notes (Addendum)
Nutrition Brief Note  Patient reports good appetite for the past couple of days. Pt ate an omelette and apple juice this morning for breakfast and had ordered a grilled chicken sandwich with fries and carrots/celery. Pt currently drowsy and states he will eat it later. Pt has been eating well, 100% meal completion x 48 hours. Patient interested in protein supplement. Will order Premier Protein BID. No weight loss recorded.  Wt Readings from Last 15 Encounters:  03/07/16 255 lb 11.7 oz (116 kg)  03/03/16 238 lb (108 kg)  02/24/16 238 lb (108 kg)  02/15/16 238 lb (108 kg)  01/31/16 238 lb 12 oz (108.3 kg)  01/19/16 238 lb (108 kg)  01/04/16 230 lb (104.3 kg)  12/29/15 230 lb (104.3 kg)  12/28/15 230 lb 12 oz (104.7 kg)  12/22/15 231 lb (104.8 kg)  12/21/15 240 lb (108.9 kg)  11/11/15 240 lb 4 oz (109 kg)  11/10/15 241 lb 4.8 oz (109.5 kg)  10/22/15 229 lb (103.9 kg)  10/18/15 221 lb (100.2 kg)    Body mass index is 37.77 kg/m. Patient meets criteria for obesity based on current BMI.   Current diet order is CHO modified, patient is consuming approximately 100% x multiple meals at this time. Labs and medications reviewed.   No nutrition interventions warranted at this time. If nutrition issues arise, please consult RD.   Clayton Bibles, MS, RD, LDN Pager: [phone] After Hours Pager: [phone]

## 2016-03-15 ENCOUNTER — Ambulatory Visit: Payer: BC Managed Care – PPO | Admitting: Endocrinology

## 2016-03-15 LAB — CBC
HEMATOCRIT: 23.9 % — AB (ref 39.0–52.0)
HEMOGLOBIN: 8.2 g/dL — AB (ref 13.0–17.0)
MCH: 29.8 pg (ref 26.0–34.0)
MCHC: 34.3 g/dL (ref 30.0–36.0)
MCV: 86.9 fL (ref 78.0–100.0)
Platelets: 691 10*3/uL — ABNORMAL HIGH (ref 150–400)
RBC: 2.75 MIL/uL — AB (ref 4.22–5.81)
RDW: 13.5 % (ref 11.5–15.5)
WBC: 9.3 10*3/uL (ref 4.0–10.5)

## 2016-03-15 LAB — GLUCOSE, CAPILLARY
GLUCOSE-CAPILLARY: 179 mg/dL — AB (ref 65–99)
Glucose-Capillary: 181 mg/dL — ABNORMAL HIGH (ref 65–99)

## 2016-03-15 MED ORDER — INSULIN GLARGINE 100 UNIT/ML SOLOSTAR PEN: 35.0000 [IU] | PEN_INJECTOR | Freq: Every day | SUBCUTANEOUS | Status: DC

## 2016-03-15 MED ORDER — INSULIN ASPART 100 UNIT/ML FLEXPEN: 6.0000 [IU] | PEN_INJECTOR | Freq: Three times a day (TID) | SUBCUTANEOUS | 0 refills | Status: DC

## 2016-03-15 MED ORDER — AMOXICILLIN-POT CLAVULANATE 875-125 MG PO TABS: 1.0000 | ORAL_TABLET | Freq: Two times a day (BID) | ORAL | 0 refills | Status: DC

## 2016-03-15 MED ORDER — OXYCODONE-ACETAMINOPHEN 5-325 MG PO TABS: 1.0000 | ORAL_TABLET | Freq: Three times a day (TID) | ORAL | 0 refills | Status: DC | PRN

## 2016-03-15 NOTE — Discharge Instructions (Addendum)
WOUND CARE: - dressing to be changed twice daily - supplies: sterile saline, kerlix, scissors, ABD pads, tape  - remove dressing and all packing carefully, moistening with sterile saline as needed to avoid packing/internal dressing sticking to the wound. - clean edges of skin around the wound with water/gauze, making sure there is no tape debris or leakage left on skin that could cause skin irritation or breakdown. - dampen and clean kerlix with sterile saline and pack wound from wound base to skin level, making sure to take note of any possible areas of wound tracking, tunneling and packing appropriately. Wound can be packed loosely. Trim kerlix to size if a whole kerlix is not required. - cover wound with a dry ABD pad and secure with tape.  - write the date/time on the dry dressing/tape to better track when the last dressing change occurred. - change dressing as needed if leakage occurs, wound gets contaminated, or patient requests to shower. - patient may shower daily with wound open and following the shower the wound should be dried and a clean dressing placed.     Necrotizing Fasciitis Necrotizing fasciitis is a severe bacterial infection of the skin and the tissues underneath the skin. The bacteria that cause this infection have been called flesh-eating bacteria because the infection can quickly kill flesh around the infection. Necrotizing fasciitis is very rare in people who have a normal disease-fighting (immune) system. It most often affects people who have a weak immune system. This condition is a medical emergency that must be treated quickly. Untreated necrotizing fasciitis can be life-threatening. What are the causes? This infection is caused by bacteria. There may be one type of bacteria or a combination of bacteria. Streptococcus and staphylococcus are common types of bacteria that can cause this infection. These bacteria are often found on the skin. In most cases, bacteria enter  the tissue under the skin through a:  Cut.  Scrape.  Surgical incision.  Insect bite.  Needle puncture. Once inside the body, the infection spreads along tissue that covers the muscles (fascia). The bacteria produce poisons (toxins) that kill tissues as the infection spreads. This reduces the blood supply to the surrounding tissues and causes more cell death. This makes it hard for the immune system to fight the infection. What increases the risk? You may have a higher risk of this condition if you:  Have a weak immune system.  Are of advanced age.  Have another medical condition, such as:  Diabetes.  Cancer.  Kidney disease.  Liver disease.  A blood vessel disease.  HIV (human immunodeficiency virus) or AIDS (acquired immunodeficiency syndrome).  Lymphedema.  Have recently had surgery.  Are obese.  Abuse drugs or alcohol. What are the signs or symptoms? Symptoms start quickly. Severe pain is the main symptom. Other early signs and symptoms may include:  Redness and warmth.  Flu-like symptoms.  Skin color changing from red to purple, and then to dark spots.  Swelling that makes the skin feel hard (hard swelling).  Crackling noise when pressing on the skin (crepitus).  High fever and chills.  Vomiting. As the condition gets worse, these signs and symptoms may develop:  Blisters, ulcers, or splitting of the skin.  Drainage of pus and fluid.  Rapid breathing.  Sudden decrease in pain.  Confusion.  Loss of consciousness. How is this diagnosed? It is important to diagnose this condition as soon as possible. This condition may be diagnosed based on:  Surgery to open the infected  area and check for tissue death (surgical exploration).This is often the most important part of diagnosis. During surgical exploration, samples of tissue may be taken, and dead tissue may be removed.  Your symptoms.  Your medical history.  A physical exam.  Blood  tests.  Imaging tests, such as:  X-rays.  Ultrasound.  CT scan.  MRI.  Taking fluid samples of drainage from the skin or underneath the skin to test for the type of bacteria that may be causing the infection (cultures). How is this treated? This condition is treated as an emergency at the hospital. Treatment may include:  Fluids and antibiotic medicines given through a vein.  Medicines to support blood pressure.  Surgery to open the skin and remove dead or dying tissue. This may need to be repeated until your condition improves. Surgical incisions may be packed with gauze and left open until the infection is controlled. After the infection has been controlled, plastic or reconstructive surgery may be done to close the incisions with skin from another area of your body (graft). Follow these instructions at home:  Take your antibiotic medicine as told by your health care provider. Do not stop taking the antibiotic even if you start to feel better or your condition improves.  Do not use any tobacco products, such as cigarettes, chewing tobacco, and e-cigarettes. If you need help quitting, ask your health care provider.  Take over-the-counter and prescription medicines only as told by your health care provider.  Return to your normal activities as told by your health care provider. Ask your health care provider what activities are safe for you.  Keep all follow-up visits as told by your health care provider. This is important. How is this prevented?  Wash your hands frequently with soap and warm water. If soap and water are not available, use hand sanitizer.  Wash broken skin with soap and water and cover it with a clean, dry bandage until healed.  Use germ-killing cream or ointment on cuts and sores.  Do not go in hot tubs, swimming pools, lakes, or ponds if you have an open cut. Contact a health care provider if:  You have a fever or chills.  You have a wound that develops  signs of infection, such as:  Redness, swelling, or pain.  Warmth.  A bad smell.  Fluid, blood, or pus. Get help right away if:  You have severe pain.  Your skin changes color.  You develop hard swelling.  You have a wound that splits open. This information is not intended to replace advice given to you by your health care provider. Make sure you discuss any questions you have with your health care provider. Document Released: 04/01/2015 Document Revised: 07/13/2015 Document Reviewed: 04/01/2015 Elsevier Interactive Patient Education  2017 [place].   Pain medication instructions  How can pain medicine affect me?  You were given a prescription for pain medicine. This medicine may make you tired or drowsy and may affect your ability to think clearly. Pain medicine may also affect your ability to drive or perform certain physical activities. It may not be possible to make all of your pain go away, but you should be comfortable enough to move, breathe, and take care of yourself. How often should I take pain medicine and how much should I take?  Take pain medicine only as directed by your health care provider and only as needed for pain.  You do not need to take pain medicine if you are  not having pain, unless directed by your health care provider.  You can take less than the prescribed dose if you find that a smaller amount of medicine controls your pain. What restrictions do I have while taking pain medicine? Follow these instructions after you start taking pain medicine, while you are taking the medicine, and for 8 hours after you stop taking the medicine:  Do not drive.  Do not operate machinery.  Do not operate power tools.  Do not sign legal documents.  Do not drink alcohol.  Do not take sleeping pills.  Do not supervise children by yourself.  Do not participate in activities that require climbing or being in high places.  Do not enter a body of  water--such as a lake, river, ocean, spa, or swimming pool--without an adult nearby who can monitor and help you. How can I keep others safe while I am taking pain medicine?  Store your pain medicine as directed by your health care provider. Make sure that it is placed where children and pets cannot reach it.  Never share your pain medicine with anyone.  Do not save any leftover pills. If you have any leftover pain medicine, get rid of it or destroy it as directed by your health care provider. What else do I need to know about taking pain medicine?  Use a stool softener if you become constipated from your pain medicine. Increasing your intake of fruits and vegetables will also help with constipation.  Write down the times when you take your pain medicine. Look at the times before you take your next dose of medicine. It is easy to become confused while on pain medicine. Recording the times helps you to avoid an overdose.  If your pain is severe, do not try to treat it yourself by taking more pills than instructed on your prescription. Contact your health care provider for help.  You may have been prescribed a pain medicine that contains acetaminophen. Do not take any other acetaminophen while taking this medicine. An overdose of acetaminophen can result in severe liver damage. Acetaminophen is found in many over-the-counter (OTC) and prescription medicines. If you are taking any medicines in addition to your pain medicine, check the active ingredients on those medicines to see if acetaminophen is listed. When should I call my health care provider?  Your medicine is not helping to make the pain go away.  You vomit or have diarrhea shortly after taking the medicine.  You develop new pain in areas that did not hurt before.  You have an allergic reaction to your medicine. This may include: ? Itchiness. ? Swelling. ? Dizziness. ? Developing a new rash. When should I call 911 or go to the  emergency room?  You feel dizzy or you faint.  You are very confused or disoriented.  You repeatedly vomit.  Your skin or lips turn pale or bluish in color.  You have shortness of breath or you are breathing much more slowly than usual.  You have a severe allergic reaction to your medicine. This includes: ? Developing tongue swelling. ? Having difficulty breathing. This information is not intended to replace advice given to you by your health care provider. Make sure you discuss any questions you have with your health care provider. Document Released: 05/08/2000 Document Revised: 08/20/2015 Document Reviewed: 12/04/2013 Elsevier Interactive Patient Education  2017 [employer].  Additional discharge instructions:  Please get your medications reviewed and adjusted by your Primary MD.  Please request your  Primary MD to go over all Hospital Tests and Procedure/Radiological results at the follow up, please get all Hospital records sent to your Prim MD by signing hospital release before you go home.  If you had Pneumonia of Lung problems at the Hospital: Please get a 2 view Chest X ray done in 6-8 weeks after hospital discharge or sooner if instructed by your Primary MD.  If you have Congestive Heart Failure: Please call your Cardiologist or Primary MD anytime you have any of the following symptoms:  1) 3 pound weight gain in 24 hours or 5 pounds in 1 week  2) shortness of breath, with or without a dry hacking cough  3) swelling in the hands, feet or stomach  4) if you have to sleep on extra pillows at night in order to breathe  Follow cardiac low salt diet and 1.5 lit/day fluid restriction.  If you have diabetes Accuchecks 4 times/day, Once in AM empty stomach and then before each meal. Log in all results and show them to your primary doctor at your next visit. If any glucose reading is under 80 or above 300 call your primary MD immediately.  If you have  Seizure/Convulsions/Epilepsy: Please do not drive, operate heavy machinery, participate in activities at heights or participate in high speed sports until you have seen by Primary MD or a Neurologist and advised to do so again.  If you had Gastrointestinal Bleeding: Please ask your Primary MD to check a complete blood count within one week of discharge or at your next visit. Your endoscopic/colonoscopic biopsies that are pending at the time of discharge, will also need to followed by your Primary MD.  Get Medicines reviewed and adjusted. Please take all your medications with you for your next visit with your Primary MD  Please request your Primary MD to go over all hospital tests and procedure/radiological results at the follow up, please ask your Primary MD to get all Hospital records sent to his/her office.  If you experience worsening of your admission symptoms, develop shortness of breath, life threatening emergency, suicidal or homicidal thoughts you must seek medical attention immediately by calling 911 or calling your MD immediately  if symptoms less severe.  You must read complete instructions/literature along with all the possible adverse reactions/side effects for all the Medicines you take and that have been prescribed to you. Take any new Medicines after you have completely understood and accpet all the possible adverse reactions/side effects.   Do not drive or operate heavy machinery when taking Pain medications.   Do not take more than prescribed Pain, Sleep and Anxiety Medications  Special Instructions: If you have smoked or chewed Tobacco  in the last 2 yrs please stop smoking, stop any regular Alcohol  and or any Recreational drug use.  Wear Seat belts while driving.  Please note You were cared for by a hospitalist during your hospital stay. If you have any questions about your discharge medications or the care you received while you were in the hospital after you are  discharged, you can call the unit and asked to speak with the hospitalist on call if the hospitalist that took care of you is not available. Once you are discharged, your primary care physician will handle any further medical issues. Please note that NO REFILLS for any discharge medications will be authorized once you are discharged, as it is imperative that you return to your primary care physician (or establish a relationship with a primary care  physician if you do not have one) for your aftercare needs so that they can reassess your need for medications and monitor your lab values.  You can reach the hospitalist office at phone [phone] or fax [phone]   If you do not have a primary care physician, you can call [phone] for a physician referral.

## 2016-03-15 NOTE — Progress Notes (Signed)
Patient discharged via private vehicle with wife.

## 2016-03-15 NOTE — Progress Notes (Signed)
   03/15/16   Hydrotherapy [phone]  Subjective Assessment   Patient and Family Stated Goals i am going home soon.  Date of Onset 03/04/16  Prior Treatments I and D x 2  Wound / Incision (Open or Dehisced) 03/10/16 Incision - Open Perineum Right;Medial open surgical wound perirectal on the right  buttock  Date First Assessed/Time First Assessed: 03/10/16 0815   Wound Type: Incision - Open  Location: Perineum  Location Orientation: Right;Medial  Wound Description (Comments): open surgical wound perirectal on the right  buttock  Present on Admission: No  Dressing Type ABD;Gauze (Comment);Mesh briefs  Dressing Changed Changed  Dressing Status Clean;Dry;Intact  Dressing Change Frequency Twice a day  Site / Wound Assessment Dressing in place / Unable to assess  % Wound base Red or Granulating 70%  % Wound base Yellow/Fibrinous Exudate 30%  Peri-wound Assessment Intact;Edema;Induration  Wound Length (cm) 4.5 cm  Wound Width (cm) 4 cm  Wound Depth (cm) 4.5 cm  Tunneling (cm) 5 (at 5 and 1 oclock)  Margins Unattached edges (unapproximated)  Drainage Amount Moderate  Drainage Description Serosanguineous;Purulent  Non-staged Wound Description Not applicable  Treatment Debridement (Selective);Hydrotherapy (Pulse lavage);Packing (Impregnated strip);Packing (Saline gauze)  Hydrotherapy  Pulsed lavage therapy - wound location perirectal on the right  Pulsed Lavage with Suction (psi) 8 psi  Pulsed Lavage with Suction - Normal Saline Used 500 mL  Pulsed Lavage Tip Tip with splash shield  Selective Debridement  Selective Debridement - Location perirectal wound  Selective Debridement - Tools Used Forceps;Scissors  Selective Debridement - Tissue Removed fibrinous tissueand slough  Wound Therapy - Assess/Plan/Recommendations  Wound Therapy - Clinical Statement wound has less yellow slough today. Bright pink wound bed. Plans to Dc soon with [facility] drssing changes./  Wound Therapy - Functional Problem  List the patient is independent in bed mobility. will benefit from ambulation   Factors Delaying/Impairing Wound Healing Diabetes Mellitus  Hydrotherapy Plan Debridement;Dressing change;Pulsatile lavage with suction;Patient/family education  Wound Therapy - Frequency 6X / week  Wound Therapy - Current Recommendations PT  Wound Therapy - Follow Up Recommendations Home health RN  Wound Plan PLS 6 x week  Wound Therapy Goals - Improve the function of patient's integumentary system by progressing the wound(s) through the phases of wound healing by:  Decrease Necrotic Tissue to 0  Decrease Necrotic Tissue - Progress Progressing toward goal  Increase Granulation Tissue to 100  Increase Granulation Tissue - Progress Progressing toward goal  Decrease Length/Width/Depth by (cm) 2 cm  Decrease Length/Width/Depth - Progress Progressing toward goal  Improve Drainage Characteristics Min;Serous  Improve Drainage Characteristics - Progress Progressing toward goal  Patient/Family will be able to  reposition self  Patient/Family Instruction Goal - Progress Met  Time For Goal Achievement 2 weeks  Wound Therapy - Potential for Goals Excellent  Tresa Endo PT [phone]

## 2016-03-15 NOTE — Progress Notes (Signed)
Patient ID: Danny Fuller, male   DOB: 01-25-1976, 41 y.o.   MRN: [id number] 6 Days Post-Op   Subjective: No acute issues.   Objective: Vital signs in last 24 hours: Temp:  [97.9 F (36.6 C)-98.4 F (36.9 C)] 98.4 F (36.9 C) (01/31 0422) Pulse Rate:  [82-98] 82 (01/31 0422) Resp:  [18] 18 (01/31 0422) BP: (137-167)/(78-96) 137/78 (01/31 0422) SpO2:  [98 %-100 %] 100 % (01/31 0422) Last BM Date: 03/13/16  Intake/Output from previous day: 01/30 0701 - 01/31 0700 In: 990 [P.O.:840; IV Piggyback:150] Out: 3700 [Urine:3700] Intake/Output this shift: No intake/output data recorded.  General: alert, cooperative, no distress -Wound: wound edges are clean and beefy pink.  Mild and continually improving edema along the right medial gluteus, minimally tender.   Lab Results:   Recent Labs  03/14/16 0411 03/15/16 0413  WBC 11.5* 9.3  HGB 8.2* 8.2*  HCT 23.8* 23.9*  PLT 679* 691*   BMET  Recent Labs  03/13/16 0353 03/14/16 0411  NA 127* 132*  K 4.5 4.3  CL 95* 96*  CO2 24 29  GLUCOSE 231* 206*  BUN 8 8  CREATININE 0.86 0.74  CALCIUM 7.9* 8.4*     Studies/Results: No results found.  Anti-infectives: Anti-infectives    Start     Dose/Rate Route Frequency Ordered Stop   03/14/16 1200  piperacillin-tazobactam (ZOSYN) IVPB 3.375 g     3.375 g 12.5 mL/hr over 240 Minutes Intravenous Every 8 hours 03/14/16 0855 03/23/16 2300   03/10/16 1500  vancomycin (VANCOCIN) IVPB 1000 mg/200 mL premix  Status:  Discontinued     1,000 mg 200 mL/hr over 60 Minutes Intravenous Every 8 hours 03/10/16 1418 03/14/16 0826   03/08/16 0200  vancomycin (VANCOCIN) IVPB 750 mg/150 ml premix  Status:  Discontinued     750 mg 150 mL/hr over 60 Minutes Intravenous Every 12 hours 03/07/16 1212 03/10/16 1418   03/07/16 1800  clindamycin (CLEOCIN) IVPB 900 mg  Status:  Discontinued     900 mg 100 mL/hr over 30 Minutes Intravenous Every 8 hours 03/07/16 1656 03/14/16 0826   03/07/16 1400   meropenem (MERREM) 1 g in sodium chloride 0.9 % 100 mL IVPB  Status:  Discontinued     1 g 200 mL/hr over 30 Minutes Intravenous Every 8 hours 03/07/16 1212 03/14/16 0826   03/07/16 1300  vancomycin (VANCOCIN) 2,000 mg in sodium chloride 0.9 % 500 mL IVPB     2,000 mg 250 mL/hr over 120 Minutes Intravenous  Once 03/07/16 1212 03/07/16 1456   03/05/16 1100  ceFEPIme (MAXIPIME) 2 g in dextrose 5 % 50 mL IVPB  Status:  Discontinued     2 g 100 mL/hr over 30 Minutes Intravenous Every 12 hours 03/05/16 0829 03/05/16 1003   03/05/16 1030  ceFEPIme (MAXIPIME) 1 g in dextrose 5 % 50 mL IVPB  Status:  Discontinued     1 g 100 mL/hr over 30 Minutes Intravenous Every 8 hours 03/05/16 1003 03/07/16 1140   03/05/16 0030  ceFEPIme (MAXIPIME) 2 g in dextrose 5 % 50 mL IVPB     2 g 100 mL/hr over 30 Minutes Intravenous  Once 03/05/16 0027 03/05/16 0157   03/04/16 2315  cefTRIAXone (ROCEPHIN) 2 g in dextrose 5 % 50 mL IVPB     2 g 100 mL/hr over 30 Minutes Intravenous  Once 03/04/16 2314 03/04/16 2357      Assessment/Plan: s/p Procedure(s): EXAM UNDER ANESTHESIA, IRRIGATION AND DEBRIDEMENT PERIRECTAL ABSCESS Continues to  improve; WBC has normalized. Would switch to PO abx at this point- defer to ID recommendations. Continue current treatment. From surgical standpoint he can be discharged with home health for BID dressing changes.    LOS: 11 days    [name] 03/15/2016

## 2016-03-15 NOTE — Progress Notes (Signed)
Wife at bedside for dressing change teaching. Wife educated on wet to dry dressing change. Return demonstration per wife.

## 2016-03-15 NOTE — Care Management Note (Signed)
Case Management Note  Patient Details  Name: JOLON KATTNER MRN: [id number] Date of Birth: [birth year]  Subjective/Objective:            41 yo admitted with Necrotizing Fasciitis.        Action/Plan: From home with wife. Pt needs HHRN at discharge for wound care. Pt offered choice and chose [facility]. [facility] rep contacted for referral. Orders received from MD. Wife to learn dressing changes from staff prior to discharge. No other CM needs communicated.  Expected Discharge Date:                  Expected Discharge Plan:  [place]  In-House Referral:     Discharge planning Services  CM Consult  Post Acute Care Choice:  Home Health Choice offered to:  Patient  DME Arranged:    DME Agency:     HH Arranged:  RN Julian Agency:  [place]  Status of Service:  Completed, signed off  If discussed at [place] of Stay Meetings, dates discussed:    Additional CommentsLynnell Catalan, RN 03/15/2016, 10:53 AM  [phone]

## 2016-03-15 NOTE — Discharge Summary (Signed)
Physician Discharge Summary  BUCK MCAFFEE [id number] DOB: 11/07/1975  PCP: [name], MD  Admit date: 03/04/2016 Discharge date: 03/15/2016  Recommendations for Outpatient Follow-up:  1. Dr. [name], PCP in one week with repeat labs (CBC & BMP). 2. Dr. Michel Bickers, infectious disease on 03/30/16 at 9:15 AM. 3. Dr. Johnathan Hausen, General Surgery: Follow-up in 2-3 weeks.   Home Health: RN for wound care. Equipment/Devices: None    Discharge Condition: Improved and stable.  CODE STATUS: Full  Diet recommendation: Heart healthy and diabetic diet.  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 He met sepsis criteria on admission, code sepsis was activated and started on IV cefepime for presumed acute pyelonephritis. CT abdomen and pelvis showed right pyelonephritis. Blood and urine cultures were negative but patient failed to improve on IV cefepime and continued to have fevers. Right flank and abdominal pain resolved. On 1/23, complained of acute right buttock pain with drainage. Repeat imaging confirmed Fournier's gangrene, Gen. surgery performed I&D in the OR on 1/23, postop he developed hypotension and was transferred to stepdown unit. Hypotension resolved. On 1/25, more pain, persistent tachycardia, worsening leukocytosis, concern for residual infection and hence CCS the patient back to OR on 1/25 and underwent manual digital exploration with evacuation of bloody  tan pus. Clinically improved. Transferred to medical bed 1/28   Assessment & Plan:   1. Sepsis secondary to Fournier's gangrene: As indicated above, initially treated for presumed right-sided acute pyelonephritis based on clinical picture and CT abdomen. Blood cultures 2 from 1/20: Negative. Urine culture 1/20: Negative. Wound culture 1/23: No growth to date. Repeat blood cultures 1/25: Negative. Continued to spike fevers. On 1/23 new complaints of right buttock pain and drainage. CT abdomen and pelvis confirmed Fournier's gangrene. General surgery was consulted and performed I&D in OR on 1/23. Postop developed hypotension and was transferred to stepdown unit for close monitoring and management. Hypotension resolved with IV fluids. Antibiotics were broadened to IV meropenem, vancomycin and clindamycin (has been on since 03/07/16). On 1/25, more pain, persistent tachycardia, worsening leukocytosis, concern for residual infection and hence CCS took back to OR on 1/25 and underwent manual digital exploration with evacuation of bloody tan pus. Wound care per surgery. Continued hydrotherapy. Clinically improved. MRSA screen negative. Infectious disease was consulted and their input appreciated. They recommended discharging on oral Augmentin 875 MG twice a day to complete total 3 weeks antibiotics since debridement, no need for MRSA coverage since he is not colonized nor has he had it in previous infections and continued wound care. Home health RN arranged for wound care. General surgery has seen today and cleared for discharge home. 2. 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 Diabetes coordinator consultation appreciated. In the hospital, his Lantus was titrated up, continued on SSI and mealtime NovoLog was added. Hyperglycemia in the hospital likely related to acute infection. His CBGs are reasonably controlled. He will be  discharged on increased dose of Lantus as below, continue prior home dose  of SSI and add mealtime NovoLog as below. Close outpatient follow-up and may need further adjustment of these medications. Outpatient diabetes education. Continue Lyrica.  3. Essential hypertension: Transient postop hypotension 1/23 has resolved. Continue amlodipine, clonidine, hydralazine and lisinopril. Fluctuating and mildly uncontrolled at times. Continue current regimen without change. 4. Sinus tachycardia: Due to problem #1 and pain. Resolved. 5. Anemia: Baseline hemoglobin may be in the 10-11 range. Hemoglobin was in the mid 8 g per DL range for the last couple of days but has dropped to 7.8>7.7 on 1/27 >likely secondary to acute illness/infection and some degree of surgical blood loss. Hb 8 and stable for the last 4 days. Follow CBCs as outpatient. 6. Leukocytosis: Secondary to sepsis. Management as above. Resolved. 7. Reactive thrombocytosis: Periodically follow CBCs. Secondary to acute infection. Expected to improve. 8. Hypokalemia: replaced. Mg 1.7 9. Hyponatremia: Stable. Likely multifactorial secondary to? SIADH and hyperglycemia. Clinically euvolemic. Follow BMP periodically.     Consultants:   General surgery  Procedures:   I&D of perirectal/perineal abscess by general surgery on 03/07/16  On 1/25: Exam under anesthesia with opening on the right near base of scrotum; Manual digital exploration with evacuation of bloody tan pus   Discharge Instructions  Discharge Instructions    Ambulatory referral to Nutrition and Diabetic Education    Complete by:  As directed    HgbA1C - 11.6%   Call MD for:  difficulty breathing, headache or visual disturbances    Complete by:  As directed    Call MD for:  extreme fatigue    Complete by:  As directed    Call MD for:  persistant dizziness or light-headedness    Complete by:  As directed    Call MD for:  persistant nausea and vomiting    Complete by:  As  directed    Call MD for:  redness, tenderness, or signs of infection (pain, swelling, redness, odor or green/yellow discharge around incision site)    Complete by:  As directed    Call MD for:  severe uncontrolled pain    Complete by:  As directed    Call MD for:  temperature >100.4    Complete by:  As directed    Diet - low sodium heart healthy    Complete by:  As directed    Diet Carb Modified    Complete by:  As directed    Discharge wound care:    Complete by:  As directed    WOUND CARE: - dressing to be changed twice daily - supplies: sterile saline, kerlix, scissors, ABD pads, tape  - remove dressing and all packing carefully, moistening with sterile saline as needed to avoid packing/internal dressing sticking to the wound. - clean edges of skin around the wound with water/gauze, making sure there is no tape debris or leakage left on skin that could cause skin irritation or breakdown. - dampen and clean kerlix with sterile saline and pack wound from wound base to skin level, making sure to take note of any possible areas of wound tracking, tunneling and packing appropriately. Wound can be packed loosely. Trim kerlix to size if a whole kerlix is not required. - cover wound with a dry ABD pad and secure with tape.  - write the date/time on the dry dressing/tape to better track when the last dressing change occurred. - change dressing as needed if leakage occurs, wound gets contaminated, or patient requests to shower. - patient may shower daily with wound open  and following the shower the wound should be dried and a clean dressing placed.   Increase activity slowly    Complete by:  As directed        Medication List    STOP taking these medications   traMADol 50 MG tablet Commonly known as:  ULTRAM     TAKE these medications   amLODipine 10 MG tablet Commonly known as:  NORVASC Take 1 tablet (10 mg total) by mouth daily.   amoxicillin-clavulanate 875-125 MG  tablet Commonly known as:  AUGMENTIN Take 1 tablet by mouth 2 (two) times daily.   cloNIDine 0.1 MG tablet Commonly known as:  CATAPRES Take 0.1 mg by mouth 2 (two) times daily.   glucose blood test strip Commonly known as:  ONETOUCH VERIO Use to check blood sugar 1 time per day.   hydrALAZINE 25 MG tablet Commonly known as:  APRESOLINE Take 1 tablet (25 mg total) by mouth every 8 (eight) hours.   insulin aspart 100 UNIT/ML FlexPen Commonly known as:  NOVOLOG FLEXPEN Before each meal 3 times a day, 140-199 - 2 units, 200-250 - 4 units, 251-299 - 6 units,  300-349 - 8 units,  350 or above 10 units. Insulin PEN if approved, provide syringes and needles if needed. What changed:  how much to take  how to take this  when to take this  additional instructions   insulin aspart 100 UNIT/ML FlexPen Commonly known as:  NOVOLOG FLEXPEN Inject 6 Units into the skin 3 (three) times daily with meals. What changed:  You were already taking a medication with the same name, and this prescription was added. Make sure you understand how and when to take each.   Insulin Glargine 100 UNIT/ML Solostar Pen Commonly known as:  LANTUS Inject 35 Units into the skin daily at 10 pm. What changed:  how much to take   lisinopril 20 MG tablet Commonly known as:  PRINIVIL,ZESTRIL Take 1 tablet (20 mg total) by mouth daily.   oxyCODONE-acetaminophen 5-325 MG tablet Commonly known as:  PERCOCET/ROXICET Take 1-2 tablets by mouth every 8 (eight) hours as needed for moderate pain or severe pain (or prior to dressing change.).   pregabalin 75 MG capsule Commonly known as:  LYRICA Take 1 capsule (75 mg total) by mouth 2 (two) times daily.      Follow-up Information    Pedro Earls, MD. Call.   Specialty:  General Surgery Why:  2-3 weeks Contact information: [address] [place] [address] [phone]        [facility] Follow up.   Contact  information: [place] [address] [phone]        [name], MD. Schedule an appointment as soon as possible for a visit in 1 week(s).   Specialty:  Family Medicine Why:  To be seen with repeat labs (CBC & BMP). Contact information: [place] [address] [phone]        Michel Bickers, MD Follow up on 03/30/2016.   Specialty:  Infectious Diseases Why:  9:15 AM. Contact information: [address] [place] [address] [phone]          No Known Allergies   Procedures/Studies: Dg Chest 2 View  Result Date: 03/04/2016 CLINICAL DATA:  Fever, sepsis.  Hyperglycemia and dizziness. EXAM: CHEST  2 VIEW COMPARISON:  Radiographs 12/29/2015. FINDINGS: The cardiomediastinal contours are normal. Lung volumes are low. Pulmonary vasculature is  normal. No consolidation, pleural effusion, or pneumothorax. No acute osseous abnormalities are seen. IMPRESSION: Low lung volumes without acute abnormality. Electronically Signed   By: Jeb Levering M.D.   On: 03/04/2016 22:30   Ct Abdomen Pelvis W Contrast  Result Date: 03/07/2016 CLINICAL DATA:  Elevated white blood cell count, new pain in the right gluteal region, evaluate for abscess EXAM: CT ABDOMEN AND PELVIS WITH CONTRAST TECHNIQUE: Multidetector CT imaging of the abdomen and pelvis was performed using the standard protocol following bolus administration of intravenous contrast. CONTRAST:  140m ISOVUE-300 IOPAMIDOL (ISOVUE-300) INJECTION 61%, 339mISOVUE-300 IOPAMIDOL (ISOVUE-300) INJECTION 61% COMPARISON:  CT abdomen pelvis 03/04/2016 FINDINGS: Lower chest: The lung bases are clear. Hepatobiliary: The liver is somewhat low in attenuation which may indicate fatty infiltration. A single small fully defined low-attenuation structures present in the right lobe on image 24 probably representing hemangioma. No calcified gallstones are seen. Pancreas: The pancreas is normal  in size and the pancreatic duct is not dilated. Spleen: The spleen is unremarkable. Adrenals/Urinary Tract: The adrenal glands are stable in contour. The kidneys enhance with no change in the small cysts in the lower pole of the left kidney. No hydronephrosis is noted. The ureters are normal in caliber. There is more perinephric strandiness particularly over the lower pole of the right kidney which is nonspecific but could indicate mild inflammation. The urinary bladder is not well distended but no abnormality is seen. Stomach/Bowel: The stomach is not well distended but no abnormality is seen. No small bowel distention is noted. The colon is largely decompressed with a moderate amount of feces present. The terminal ileum and the appendix are unremarkable. Vascular/Lymphatic: The abdominal aorta is normal in caliber with moderate abdominal aortic atherosclerosis present. No adenopathy is seen. Borderline right groin nodes are unchanged compared to the prior study. Reproductive: The prostate is small. Other: There is inflammation and soft tissue gas present within the right medial buttocks primarily in the fat planes extending into the perineum near the base of the scrotum. These findings are consistent with necrotizing soft tissue infection, often termed Fourniere's gangrene. Musculoskeletal: The lumbar vertebrae are in normal alignment. No compression deformity is seen. Intervertebral disc spaces appear normal. IMPRESSION: 1. 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 Critical Value/emergent results were called by telephone at the time of interpretation on 03/07/2016 at 3:54 pm to Dr. JeDessa Phi who verbally acknowledged these results. 2. Suspect fatty infiltration of the liver. 3. Slightly more perinephric strandiness than on the prior exam may indicate infection but no obstruction is evident. 4. Moderate abdominal aortic  atherosclerosis for age. Electronically Signed   By: PaIvar Drape.D.   On: 03/07/2016 15:56   Ct Abdomen Pelvis W Contrast  Result Date: 03/04/2016 CLINICAL DATA:  Right-sided abdominal and flank pain. Fever and leukocytosis. EXAM: CT ABDOMEN AND PELVIS WITH CONTRAST TECHNIQUE: Multidetector CT imaging of the abdomen and pelvis was performed using the standard protocol following bolus administration of intravenous contrast. CONTRAST:  100 cc Isovue-300 IV COMPARISON:  None. FINDINGS: Lower chest: The lung bases are clear.  No pleural fluid. Hepatobiliary: Decreased hepatic density consistent with steatosis. No focal lesion. Gallbladder physiologically distended, no calcified stone. No biliary dilatation. Pancreas: No ductal dilatation or inflammation. Spleen: Normal in size without focal abnormality. Adrenals/Urinary Tract: Edematous appearance of the right kidney with mild perinephric edema. No intrarenal or perirenal fluid collection. Trace stranding about the left kidney. No  hydronephrosis. Simple cyst in the lower left kidney. Urinary bladder is physiologically distended with mild wall thickening. Normal adrenal glands. Stomach/Bowel: Stomach is within normal limits. Appendix appears normal. No evidence of bowel wall thickening, distention, or inflammatory changes. Colonic tortuosity. Diverticulosis of the descending colon without acute inflammation. Vascular/Lymphatic: Age advanced atheroscleroses of the abdominal aorta and its branches. Mildly prominent retroperitoneal and bilateral external iliac nodes are likely reactive. Reproductive: Prostate is unremarkable. Other: No free air, free fluid, or intra-abdominal fluid collection. Small fat containing umbilical hernia contains noninflamed small bowel. Musculoskeletal: There are no acute or suspicious osseous abnormalities. IMPRESSION: 1. Right pyelonephritis and probable cystitis. Minimal stranding about the left kidney may reflect mild left-sided  pyelonephritis. 2. Hepatic steatosis. 3. Colonic diverticulosis without acute inflammation. 4. Age advanced atherosclerosis. Electronically Signed   By: Jeb Levering M.D.   On: 03/04/2016 22:55      Subjective: Overall continues to feel better. Underwent dressing change this morning after premedication with oral meds and did well. Ambulating well in the room. Tolerating diet, having BMs. Denies any other complaints.  Discharge Exam:  Vitals:   03/14/16 0955 03/14/16 1451 03/14/16 2109 03/15/16 0422  BP: 140/80 140/78 (!) 167/96 137/78  Pulse:  92 98 82  Resp:   18 18  Temp:  98.4 F (36.9 C) 97.9 F (36.6 C) 98.4 F (36.9 C)  TempSrc:  Oral Oral Oral  SpO2:  100% 98% 100%  Weight:      Height:        General exam: Pleasant young male lying comfortably supine in bed. Does not look septic or toxic. Respiratory system: Clear to auscultation. Respiratory effort normal. Cardiovascular system: S1 & S2 heard, RRR. No JVD, murmurs, rubs, gallops or clicks. No pedal edema.  Gastrointestinal system: Abdomen is nondistended, soft and nontender. No organomegaly or masses felt. Normal bowel sounds heard. Central nervous system: Alert and oriented. No focal neurological deficits. Extremities: Symmetric 5 x 5 power. Skin: No rashes, lesions or ulcers. Sacral area dressing clean and dry. Psychiatry: Judgement and insight appear normal. Mood & affect appropriate.     The results of significant diagnostics from this hospitalization (including imaging, microbiology, ancillary and laboratory) are listed below for reference.     Microbiology: Recent Results (from the past 240 hour(s))  Anaerobic culture     Status: None   Collection Time: 03/07/16  7:07 PM  Result Value Ref Range Status   Specimen Description WOUND  Final   Special Requests   Final    RIGHT BUTTOCKS Performed at [facility], [address] [place], [place] [address]    Culture   Final    MIXED ANAEROBIC FLORA  PRESENT.  CALL LAB IF FURTHER IID REQUIRED.   Report Status 03/12/2016 FINAL  Final  Aerobic Culture (superficial specimen)     Status: None   Collection Time: 03/07/16  7:07 PM  Result Value Ref Range Status   Specimen Description WOUND  Final   Special Requests RIGHT BUTTOCKS  Final   Gram Stain   Final    RARE WBC PRESENT, PREDOMINANTLY PMN ABUNDANT GRAM POSITIVE RODS MODERATE GRAM POSITIVE COCCI IN CLUSTERS MODERATE GRAM NEGATIVE RODS    Culture   Final    NO GROWTH 2 DAYS Performed at [facility], [address] [place], [place] [address]    Report Status 03/10/2016 FINAL  Final  MRSA PCR Screening     Status: None   Collection Time: 03/07/16 10:00  PM  Result Value Ref Range Status   MRSA by PCR NEGATIVE NEGATIVE Final    Comment:        The GeneXpert MRSA Assay (FDA approved for NASAL specimens only), is one component of a comprehensive MRSA colonization surveillance program. It is not intended to diagnose MRSA infection nor to guide or monitor treatment for MRSA infections.   Culture, blood (Routine X 2) w Reflex to ID Panel     Status: None   Collection Time: 03/09/16 12:01 PM  Result Value Ref Range Status   Specimen Description BLOOD RIGHT ARM  Final   Special Requests BOTTLES DRAWN AEROBIC AND ANAEROBIC 5CC  Final   Culture   Final    NO GROWTH 5 DAYS Performed at [facility], [place] [address], [place], [place] [address]    Report Status 03/14/2016 FINAL  Final  Culture, blood (Routine X 2) w Reflex to ID Panel     Status: None   Collection Time: 03/09/16 12:02 PM  Result Value Ref Range Status   Specimen Description BLOOD RIGHT HAND  Final   Special Requests IN PEDIATRIC BOTTLE 3CC  Final   Culture   Final    NO GROWTH 5 DAYS Performed at [facility], [place] [address] [place], [place] [address]    Report Status 03/14/2016 FINAL  Final     Labs:  Basic Metabolic Panel:  Recent Labs Lab 03/10/16 0344 03/11/16 0342  03/12/16 0645 03/13/16 0353 03/14/16 0411  NA 130* 129* 130* 127* 132*  K 3.7 3.4* 3.8 4.5 4.3  CL 102 99* 98* 95* 96*  CO2 21* 24 25 24 29   GLUCOSE 268* 193* 171* 231* 206*  BUN 11 9 7 8 8   CREATININE 0.99 0.70 0.70 0.86 0.74  CALCIUM 7.6* 7.6* 7.9* 7.9* 8.4*  MG  --   --  1.7  --   --    CBC:  Recent Labs Lab 03/09/16 0350 03/10/16 0344 03/11/16 0342 03/12/16 0645 03/13/16 0353 03/14/16 0411 03/15/16 0413  WBC 26.2* 25.2* 22.3* 16.3* 14.3* 11.5* 9.3  NEUTROABS 22.0* 20.9*  --   --   --   --   --   HGB 8.8* 7.8* 7.7* 8.0* 8.0* 8.2* 8.2*  HCT 25.4* 22.2* 21.9* 22.5* 23.0* 23.8* 23.9*  MCV 85.5 85.4 85.5 84.9 85.8 86.2 86.9  PLT 370 393 447* 548* 592* 679* 691*   CBG:  Recent Labs Lab 03/14/16 1202 03/14/16 1745 03/14/16 2010 03/15/16 0734 03/15/16 1205  GLUCAP 126* 152* 137* 181* 179*   Urinalysis    Component Value Date/Time   COLORURINE STRAW (A) 03/04/2016 1731   APPEARANCEUR CLEAR 03/04/2016 1731   LABSPEC 1.011 03/04/2016 1731   PHURINE 6.0 03/04/2016 1731   GLUCOSEU >=500 (A) 03/04/2016 1731   HGBUR NEGATIVE 03/04/2016 1731   HGBUR negative 04/17/2008 0929   BILIRUBINUR NEGATIVE 03/04/2016 1731   BILIRUBINUR n 04/23/2013 1350   KETONESUR NEGATIVE 03/04/2016 1731   PROTEINUR NEGATIVE 03/04/2016 1731   UROBILINOGEN 0.2 04/23/2013 1350   UROBILINOGEN 4.0 (H) 08/09/2009 0938   NITRITE NEGATIVE 03/04/2016 1731   LEUKOCYTESUR NEGATIVE 03/04/2016 1731      Time coordinating discharge: Over 30 minutes  SIGNED:  Vernell Leep, MD, FACP, FHM. [facility] Pager [phone]  If 7PM-7AM, please contact night-coverage [email or web address] Password [email or web address] 03/15/2016, 12:21 PM

## 2016-03-16 ENCOUNTER — Telehealth: Payer: Self-pay | Admitting: Endocrinology

## 2016-03-16 NOTE — Telephone Encounter (Signed)
Patient no showed today's appt. Please advise on how to follow up. °A. No follow up necessary. °B. Follow up urgent. Contact patient immediately. °C. Follow up necessary. Contact patient and schedule visit in ___ days. °D. Follow up advised. Contact patient and schedule visit in ____weeks. ° °

## 2016-03-19 ENCOUNTER — Encounter: Payer: Self-pay | Admitting: Endocrinology

## 2016-03-20 NOTE — Telephone Encounter (Signed)
Needs to be seen in follow-up as soon as possible, if he does not want to make appointment will need to consider him self dismissed

## 2016-03-21 ENCOUNTER — Encounter: Payer: Self-pay | Admitting: Family Medicine

## 2016-03-22 ENCOUNTER — Encounter: Payer: Self-pay | Admitting: Family Medicine

## 2016-03-23 ENCOUNTER — Other Ambulatory Visit (INDEPENDENT_AMBULATORY_CARE_PROVIDER_SITE_OTHER): Payer: BC Managed Care – PPO

## 2016-03-23 DIAGNOSIS — E1165 Type 2 diabetes mellitus with hyperglycemia: Secondary | ICD-10-CM

## 2016-03-23 LAB — LIPID PANEL
CHOL/HDL RATIO: 4
CHOLESTEROL: 110 mg/dL (ref 0–200)
HDL: 28.7 mg/dL — ABNORMAL LOW (ref >39.00)
LDL CALC: 51 mg/dL (ref 0–99)
NONHDL: 81.35
Triglycerides: 152 mg/dL — ABNORMAL HIGH (ref 0.0–149.0)
VLDL: 30.4 mg/dL (ref 0.0–40.0)

## 2016-03-23 LAB — URINALYSIS, ROUTINE W REFLEX MICROSCOPIC
Bilirubin Urine: NEGATIVE
Hgb urine dipstick: NEGATIVE
KETONES UR: NEGATIVE
Leukocytes, UA: NEGATIVE
Nitrite: NEGATIVE
PH: 6 (ref 5.0–8.0)
SPECIFIC GRAVITY, URINE: 1.015 (ref 1.000–1.030)
UROBILINOGEN UA: 0.2 (ref 0.0–1.0)
Urine Glucose: NEGATIVE

## 2016-03-23 LAB — MICROALBUMIN / CREATININE URINE RATIO
CREATININE, U: 162.9 mg/dL
MICROALB UR: 11.8 mg/dL — AB (ref 0.0–1.9)
Microalb Creat Ratio: 7.2 mg/g (ref 0.0–30.0)

## 2016-03-24 ENCOUNTER — Telehealth: Payer: Self-pay

## 2016-03-24 LAB — FRUCTOSAMINE: Fructosamine: 406 umol/L — ABNORMAL HIGH (ref 0–285)

## 2016-03-24 NOTE — Telephone Encounter (Signed)
Spoke with pt regarding hospital follow up appt. He would like to be seen ASAP. Scheduled pt for 03/27/16 with Dr. Sarajane Jews. Pt aware. Nothing further needed.

## 2016-03-27 ENCOUNTER — Encounter: Payer: Self-pay | Admitting: Podiatry

## 2016-03-27 ENCOUNTER — Ambulatory Visit (INDEPENDENT_AMBULATORY_CARE_PROVIDER_SITE_OTHER): Payer: BC Managed Care – PPO | Admitting: Family Medicine

## 2016-03-27 ENCOUNTER — Ambulatory Visit (INDEPENDENT_AMBULATORY_CARE_PROVIDER_SITE_OTHER): Payer: BC Managed Care – PPO | Admitting: Podiatry

## 2016-03-27 ENCOUNTER — Encounter: Payer: Self-pay | Admitting: Family Medicine

## 2016-03-27 VITALS — BP 163/104 | HR 104 | Temp 98.4°F | Ht 69.0 in | Wt 241.0 lb

## 2016-03-27 DIAGNOSIS — S98139A Complete traumatic amputation of one unspecified lesser toe, initial encounter: Secondary | ICD-10-CM | POA: Diagnosis not present

## 2016-03-27 DIAGNOSIS — M79671 Pain in right foot: Secondary | ICD-10-CM

## 2016-03-27 DIAGNOSIS — A419 Sepsis, unspecified organism: Secondary | ICD-10-CM | POA: Diagnosis not present

## 2016-03-27 DIAGNOSIS — Z794 Long term (current) use of insulin: Secondary | ICD-10-CM | POA: Diagnosis not present

## 2016-03-27 DIAGNOSIS — M729 Fibroblastic disorder, unspecified: Secondary | ICD-10-CM

## 2016-03-27 DIAGNOSIS — E1140 Type 2 diabetes mellitus with diabetic neuropathy, unspecified: Secondary | ICD-10-CM

## 2016-03-27 DIAGNOSIS — I10 Essential (primary) hypertension: Secondary | ICD-10-CM | POA: Diagnosis not present

## 2016-03-27 DIAGNOSIS — M726 Necrotizing fasciitis: Secondary | ICD-10-CM

## 2016-03-27 MED ORDER — CLONIDINE HCL 0.1 MG PO TABS: 0.1000 mg | ORAL_TABLET | Freq: Three times a day (TID) | ORAL | 11 refills | Status: DC

## 2016-03-27 MED ORDER — ONDANSETRON HCL 8 MG PO TABS: 8.0000 mg | ORAL_TABLET | Freq: Three times a day (TID) | ORAL | 2 refills | Status: DC | PRN

## 2016-03-27 MED ORDER — HYDRALAZINE HCL 25 MG PO TABS: 25.0000 mg | ORAL_TABLET | Freq: Three times a day (TID) | ORAL | 11 refills | Status: DC

## 2016-03-27 MED ORDER — AMLODIPINE BESYLATE 10 MG PO TABS: 10.0000 mg | ORAL_TABLET | Freq: Every day | ORAL | 11 refills | Status: DC

## 2016-03-27 NOTE — Progress Notes (Signed)
   Subjective:    Patient ID: Danny Fuller, male    DOB: 09/19/1975, 41 y.o.   MRN: [id number]  HPI Here to follow up a hospital stay from 03-04-16 to 03-15-16 for a necrotizing fasciitis of the right buttock which led to sepsis and pyelonephritis. He went to surgery to have this debrided. Infectious Disease was consulted and he was treated with IV antibiotics. None of the cultures was able to identify the bacteria. His blood sugars were elevated and his A1c on admission was 11.6. His BP was up and down due to the sepsis. He stabilized and was sent home on a 30 day course of Augmentin. He has been emailing with Dr. Dwyane Dee to adjust his insulin dosages. His glucoses are now running in the range of 160 to 200. His BP has stil been up and down but never too low. He describes a lot of nausea from the Augmentin even when taking it with food. No fevers. His wife is changing his wound dressing every day, and home health nurses are coming twice a week to check it.   Review of Systems  Constitutional: Negative.   Respiratory: Negative.   Cardiovascular: Negative.   Gastrointestinal: Positive for nausea. Negative for abdominal distention, abdominal pain, blood in stool, constipation, diarrhea and vomiting.  Genitourinary: Negative.   Neurological: Negative.        Objective:   Physical Exam  Constitutional: He is oriented to person, place, and time. He appears well-developed and well-nourished.  Neck: No thyromegaly present.  Cardiovascular: Normal rate, regular rhythm, normal heart sounds and intact distal pulses.   Pulmonary/Chest: Effort normal and breath sounds normal.  Abdominal: Soft. Bowel sounds are normal. He exhibits no distension and no mass. There is no tenderness. There is no rebound and no guarding.  Lymphadenopathy:    He has no cervical adenopathy.  Neurological: He is alert and oriented to person, place, and time.          Assessment & Plan:  He is recovering from a  necrotizing fasciitis. He is getting daily dressing changes and taking Augmentin. We will give him some Zofran to combat the nausea he is having. He will see Dr. Megan Salon for follow up soon. He is seeing Dr. Dwyane Dee for the diabetes management. For the labile BP we will increase his Clonidine to 0.1 mg TID. Recheck in one month. [name], MD

## 2016-03-27 NOTE — Patient Instructions (Signed)

## 2016-03-27 NOTE — Progress Notes (Signed)
Pre visit review using our clinic review tool, if applicable. No additional management support is needed unless otherwise documented below in the visit note. 

## 2016-03-28 ENCOUNTER — Encounter: Payer: Self-pay | Admitting: Endocrinology

## 2016-03-28 ENCOUNTER — Ambulatory Visit (INDEPENDENT_AMBULATORY_CARE_PROVIDER_SITE_OTHER): Payer: BC Managed Care – PPO | Admitting: Endocrinology

## 2016-03-28 VITALS — BP 112/70 | HR 104 | Ht 69.0 in | Wt 238.0 lb

## 2016-03-28 DIAGNOSIS — E1165 Type 2 diabetes mellitus with hyperglycemia: Secondary | ICD-10-CM

## 2016-03-28 DIAGNOSIS — Z794 Long term (current) use of insulin: Secondary | ICD-10-CM | POA: Diagnosis not present

## 2016-03-28 MED ORDER — GLUCOSE BLOOD VI STRP: ORAL_STRIP | 2 refills | Status: DC

## 2016-03-28 MED ORDER — METFORMIN HCL ER 500 MG PO TB24: 2000.0000 mg | ORAL_TABLET | Freq: Every day | ORAL | 3 refills | Status: DC

## 2016-03-28 NOTE — Progress Notes (Signed)
Patient ID: Danny Fuller, male   DOB: 04-Mar-1975, 41 y.o.   MRN: [id number]            Reason for Appointment: Follow-up for Type 2 Diabetes  Referring physician: Sarajane Jews   History of Present Illness:          Date of diagnosis of type 2 diabetes mellitus: 2007        Background history:   At diagnosis he was relatively asymptomatic and was started on metformin and glipizide He has been on the same oral hypoglycemic regimen for several years. Review of his A1c indicates it has been markedly increased persistently with the lowest reading in 2015 being 9.1  Recent history:   INSULIN regimen is:  Lantus 30 units at bedtime Novolog 9 tid plus correction doses       Non-insulin hypoglycemic drugs the patient is taking are: None  He was seen in consultation initially in 8/17 but did not come back for follow-up His A1c is still markedly increased at 11.6 Fructosamine is also significantly high now  Current management, blood sugar patterns and problems identified:  He was hospitalized for pyelonephritis last month and he thinks his blood sugars went up because of this  Again he did not bring a monitor for download   Even with his A1c being over 11% and he claims that his blood sugars are not very high  He was told to start metformin last year but he did not continue this as he thought it was not helping, also was having some diarrhea from regular metformin and he also had tendency to low normal blood sugars in the mornings with this.  Also he thinks he was told not to take it by the hospital Dr.  He has not had any diabetes education on meal planning advice    He thinks he is checking his blood sugars 4 times a day  Blood sugars are the highest FASTING but also probably higher after supper        Side effects from medications have been: None  Compliance with the medical regimen: Inconsistent Hypoglycemia:   none  Glucose monitoring:  done ?  2 times a day         Glucometer:  ?  Verio    Blood Glucose readings by time of day by recall   PREMEAL Breakfast Lunch Dinner Bedtime  Overall   Glucose range: 200+ 160 180 180   Median:        Self-care: Meal times are:  Breakfast is at 10 am : Dinner: 8 pm   Typical meal intake: Breakfast is Usually toast and boiled eggs.  Usually getting cold cut sandwiches at lunch, grilled chicken and vegetables/potatoes at dinner.  Snacks are usually chips, crackers               Dietician visit, most recent: Never               Exercise:  none  Weight history: His highest weight in the past has been 378    Wt Readings from Last 3 Encounters:  03/28/16 238 lb (108 kg)  03/27/16 241 lb (109.3 kg)  03/07/16 255 lb 11.7 oz (116 kg)    Glycemic control:   Lab Results  Component Value Date   HGBA1C 11.6 (H) 03/06/2016   HGBA1C 13.7 (H) 07/25/2015   HGBA1C 13.6 (H) 07/24/2015   Lab Results  Component Value Date   MICROALBUR 11.8 (H) 03/23/2016   [place]  51 03/23/2016   CREATININE 0.74 03/14/2016   Lab Results  Component Value Date   MICRALBCREAT 7.2 03/23/2016       Allergies as of 03/28/2016   No Known Allergies     Medication List       Accurate as of 03/28/16 12:58 PM. Always use your most recent med list.          amLODipine 10 MG tablet Commonly known as:  NORVASC Take 1 tablet (10 mg total) by mouth daily.   amoxicillin-clavulanate 875-125 MG tablet Commonly known as:  AUGMENTIN Take 1 tablet by mouth 2 (two) times daily.   cloNIDine 0.1 MG tablet Commonly known as:  CATAPRES Take 1 tablet (0.1 mg total) by mouth 3 (three) times daily.   glucose blood test strip Commonly known as:  ONETOUCH VERIO Use to check blood sugar 3 times per day.   hydrALAZINE 25 MG tablet Commonly known as:  APRESOLINE Take 1 tablet (25 mg total) by mouth every 8 (eight) hours.   insulin aspart 100 UNIT/ML FlexPen Commonly known as:  NOVOLOG FLEXPEN Before each meal 3 times a day, 140-199 - 2 units,  200-250 - 4 units, 251-299 - 6 units,  300-349 - 8 units,  350 or above 10 units. Insulin PEN if approved, provide syringes and needles if needed.   Insulin Glargine 100 UNIT/ML Solostar Pen Commonly known as:  LANTUS Inject 35 Units into the skin daily at 10 pm.   lisinopril 20 MG tablet Commonly known as:  PRINIVIL,ZESTRIL Take 1 tablet (20 mg total) by mouth daily.   metFORMIN 500 MG 24 hr tablet Commonly known as:  GLUCOPHAGE-XR Take 4 tablets (2,000 mg total) by mouth daily with supper.   ondansetron 8 MG tablet Commonly known as:  ZOFRAN Take 1 tablet (8 mg total) by mouth every 8 (eight) hours as needed for nausea or vomiting.   oxyCODONE-acetaminophen 5-325 MG tablet Commonly known as:  PERCOCET/ROXICET Take 1-2 tablets by mouth every 8 (eight) hours as needed for moderate pain or severe pain (or prior to dressing change.).   pregabalin 75 MG capsule Commonly known as:  LYRICA Take 1 capsule (75 mg total) by mouth 2 (two) times daily.       Allergies: No Known Allergies  Past Medical History:  Diagnosis Date  . Asthma   . Diabetes mellitus    sees Dr. Dwyane Dee   . GERD (gastroesophageal reflux disease)   . Headache(784.0)   . Hypertension     Past Surgical History:  Procedure Laterality Date  . AMPUTATION Left 07/26/2015   Procedure: AMPUTATION LEFT FIFTH RAY;  Surgeon: [name], MD;  Location: [facility];  Service: Orthopedics;  Laterality: Left;  . bilateral hip pins placed    . INCISION AND DRAINAGE PERIRECTAL ABSCESS Right 03/07/2016   Procedure: IRRIGATION AND DEBRIDEMENT PERIRECTAL ABSCESS;  Surgeon: Alphonsa Overall, MD;  Location: [facility];  Service: General;  Laterality: Right;  . INCISION AND DRAINAGE PERIRECTAL ABSCESS Right 03/09/2016   Procedure: EXAM UNDER ANESTHESIA, IRRIGATION AND DEBRIDEMENT PERIRECTAL ABSCESS;  Surgeon: Johnathan Hausen, MD;  Location: [facility];  Service: General;  Laterality: Right;  Open, betadine packed wound    Family History    Problem Relation Age of Onset  . Arthritis    . Diabetes    . Hypertension    . Hyperlipidemia    . Stroke    . Sudden death    . Diabetes Mother   . Diabetes Sister   .  Diabetes Brother     Social History:  reports that he has quit smoking. His smoking use included Cigarettes. He has never used smokeless tobacco. He reports that he does not drink alcohol or use drugs.   Review of Systems   Lipid history:     Lab Results  Component Value Date   CHOL 110 03/23/2016   HDL 28.70 (L) 03/23/2016   LDLCALC 51 03/23/2016   TRIG 152.0 (H) 03/23/2016   CHOLHDL 4 03/23/2016           Hypertension: On Rx For several years, followed by PCP, is taking lisinopril, amlodipine, clonidine and hydroxyzine  BP Readings from Last 3 Encounters:  03/28/16 112/70  03/27/16 (!) 163/104  03/15/16 123/74    Most recent eye exam was A few years ago  Most recent foot exam: 8/17  He has sharp pains in his feet from neuropathy, recently not on Lyrica and he is asking about starting back  Also is on oxycodone    LABS:  Lab on 03/23/2016  Component Date Value Ref Range Status  . Cholesterol 03/23/2016 110  0 - 200 mg/dL Final  . Triglycerides 03/23/2016 152.0* 0.0 - 149.0 mg/dL Final  . HDL 03/23/2016 28.70* >39.00 mg/dL Final  . VLDL 03/23/2016 30.4  0.0 - 40.0 mg/dL Final  . LDL Cholesterol 03/23/2016 51  0 - 99 mg/dL Final  . Total CHOL/HDL Ratio 03/23/2016 4   Final  . NonHDL 03/23/2016 81.35   Final  . Fructosamine 03/23/2016 406* 0 - 285 umol/L Final   Comment: Published reference interval for apparently healthy subjects between age 90 and 52 is 55 - 285 umol/L and in a poorly controlled diabetic population is 228 - 563 umol/L with a mean of 396 umol/L.   Marland Kitchen Microalb, Ur 03/23/2016 11.8* 0.0 - 1.9 mg/dL Final  . Creatinine,U 03/23/2016 162.9  mg/dL Final  . Microalb Creat Ratio 03/23/2016 7.2  0.0 - 30.0 mg/g Final  . Color, Urine 03/23/2016 YELLOW  Yellow;Lt. Yellow Final   . APPearance 03/23/2016 CLEAR  Clear Final  . Specific Gravity, Urine 03/23/2016 1.015  1.000 - 1.030 Final  . pH 03/23/2016 6.0  5.0 - 8.0 Final  . Total Protein, Urine 03/23/2016 TRACE* Negative Final  . Urine Glucose 03/23/2016 NEGATIVE  Negative Final  . Ketones, ur 03/23/2016 NEGATIVE  Negative Final  . Bilirubin Urine 03/23/2016 NEGATIVE  Negative Final  . Hgb urine dipstick 03/23/2016 NEGATIVE  Negative Final  . Urobilinogen, UA 03/23/2016 0.2  0.0 - 1.0 Final  . Leukocytes, UA 03/23/2016 NEGATIVE  Negative Final  . Nitrite 03/23/2016 NEGATIVE  Negative Final  . WBC, UA 03/23/2016 0-2/hpf  0-2/hpf Final  . RBC / HPF 03/23/2016 0-2/hpf  0-2/hpf Final  . Squamous Epithelial / LPF 03/23/2016 Rare(0-4/hpf)  Rare(0-4/hpf) Final    Physical Examination:  BP 112/70   Pulse (!) 104   Ht 5\' 9"  (1.753 m)   Wt 238 lb (108 kg)   SpO2 97%   BMI 35.15 kg/m     ASSESSMENT:  Diabetes type 2, uncontrolled With BMI 31 He has had long-standing diabetes with persistently poor control  See history of present illness for detailed discussion of current diabetes management, blood sugar patterns and problems identified He has not been seen in follow-up over 6 months and is now taking more insulin than before He was tried on metformin but he stopped this Discussed the need to continue metformin because of increasing insulin sensitivity especially with his persistently  high fasting readings He is likely having high blood sugars throughout the day but does not bring his monitor for download     Complications of diabetes: Amputation of toe, painful Neuropathy  Again having painful neuropathy, has not recently taken Lyrica  PLAN:    Increase basal insulin by at least 6 units  Given him guidelines on adjusting Lantus by 2 units every 3-4 days to get morning sugars below 130  Also if morning sugars start coming down below 100 he can cut back by 6 units  He will start back on metformin but  try metformin ER and increase the dose gradually up to 3 tablets at least  More consistent monitoring after meals and bring monitor for download  He needs to take at least 12 units to cover his evening meal since his sugars are probably higher at that time  He does need to be seen more regularly for follow-up because of his persistently poor control  Restart Lyrica, this  does not have any current contraindications  Counseling time on subjects discussed above is over 50% of today's 25 minute visit   Patient Instructions  Lantus 36 and weekly go up 2 units till am <120  Check blood sugars on waking up    Also check blood sugars about 2 hours after a meal and do this after different meals by rotation  Recommended blood sugar levels on waking up is 90-130 and about 2 hours after meal is 130-160  Please bring your blood sugar monitor to each visit, thank you   Novolog 10 am and 10 at lunch and 12 at supper  Start taking Metformin 500 mg, 1 tablet with your main meal for 5 days. Occasionally this may initially cause loose stools or nausea. If  tolerating well after 5 days add a second Metformin tablet (500 mg) at the same time. Continue adding another tablet after 5 days days if no persistent nausea or diarrhea until reaching the maximum tolerated dose or the full dose of 4 tablets once daily.      Danny Fuller 03/28/2016, 12:58 PM   Note: This office note was prepared with Dragon voice recognition system technology. Any transcriptional errors that result from this process are unintentional.

## 2016-03-28 NOTE — Patient Instructions (Addendum)
Lantus 36 and weekly go up 2 units till am <120  Check blood sugars on waking up    Also check blood sugars about 2 hours after a meal and do this after different meals by rotation  Recommended blood sugar levels on waking up is 90-130 and about 2 hours after meal is 130-160  Please bring your blood sugar monitor to each visit, thank you   Novolog 10 am and 10 at lunch and 12 at supper  Start taking Metformin 500 mg, 1 tablet with your main meal for 5 days. Occasionally this may initially cause loose stools or nausea. If  tolerating well after 5 days add a second Metformin tablet (500 mg) at the same time. Continue adding another tablet after 5 days days if no persistent nausea or diarrhea until reaching the maximum tolerated dose or the full dose of 4 tablets once daily.

## 2016-03-28 NOTE — Progress Notes (Signed)
Subjective:     Patient ID: Danny Fuller, male   DOB: Sep 30, 1975, 41 y.o.   MRN: [id number]  HPI patient presents today not wearing the proper-type shoes and states that he just wanted his foot checked and he wants his orthotics   Review of Systems     Objective:   Physical Exam Neurovascular status unchanged with amputation left lateral foot that's healing well with crusted tissue formation localized in nature with no proximal edema erythema or drainage noted    Assessment:     Previous amputation with long-term history of diabetes not good control with history of ulceration with no indications of drainage or current infection    Plan:     H&P conditions reviewed and recommended orthotics which we discussed and change in shoe gear and discussed different types of shoes that he can get. Patient will be seen back for [name] to recheck

## 2016-03-30 ENCOUNTER — Encounter: Payer: Self-pay | Admitting: Internal Medicine

## 2016-03-30 ENCOUNTER — Ambulatory Visit (INDEPENDENT_AMBULATORY_CARE_PROVIDER_SITE_OTHER): Payer: BC Managed Care – PPO | Admitting: Internal Medicine

## 2016-03-30 DIAGNOSIS — E1165 Type 2 diabetes mellitus with hyperglycemia: Secondary | ICD-10-CM | POA: Diagnosis not present

## 2016-03-30 DIAGNOSIS — M726 Necrotizing fasciitis: Secondary | ICD-10-CM

## 2016-03-30 NOTE — Assessment & Plan Note (Signed)
His infection has responded well to debridement and 3 weeks of antibiotic therapy. I will have him stop his amoxicillin clavulanate now. He can follow-up here as needed.

## 2016-03-30 NOTE — Progress Notes (Signed)
[facility] for Infectious Disease  Patient Active Problem List   Diagnosis Date Noted  . Poorly controlled diabetes mellitus ([place]) 03/30/2016    Priority: High  . Necrotizing fasciitis ([place]) 03/07/2016    Priority: High  . Pyelonephritis 03/04/2016  . Acquired contracture of Achilles tendon, left 02/15/2016  . Arthralgia of multiple joints 01/19/2016  . Diabetic polyneuropathy associated with type 2 diabetes mellitus ([place]) 01/18/2016  . Acquired absence of other left toe(s) ([place]) 01/18/2016  . Foot amputation status, left ([place]) 01/04/2016  . Peripheral neuropathy ([place]) 11/11/2015  . Asthma 07/23/2015  . Chronic headache 07/23/2015  . Erectile disorder due to medical condition in male 04/23/2015  . BMI 33.0-33.9,adult 04/21/2012  . GERD 04/21/2008  . Type 2 diabetes mellitus with diabetic neuropathy, with long-term current use of insulin ([place]) 11/21/2006  . Essential hypertension 11/21/2006  . LUMBAR STRAIN 11/21/2006    Patient's Medications  New Prescriptions   No medications on file  Previous Medications   AMLODIPINE (NORVASC) 10 MG TABLET    Take 1 tablet (10 mg total) by mouth daily.   CLONIDINE (CATAPRES) 0.1 MG TABLET    Take 1 tablet (0.1 mg total) by mouth 3 (three) times daily.   GLUCOSE BLOOD (ONETOUCH VERIO) TEST STRIP    Use to check blood sugar 3 times per day.   HYDRALAZINE (APRESOLINE) 25 MG TABLET    Take 1 tablet (25 mg total) by mouth every 8 (eight) hours.   INSULIN ASPART (NOVOLOG FLEXPEN) 100 UNIT/ML FLEXPEN    Before each meal 3 times a day, 140-199 - 2 units, 200-250 - 4 units, 251-299 - 6 units,  300-349 - 8 units,  350 or above 10 units. Insulin PEN if approved, provide syringes and needles if needed.   INSULIN GLARGINE (LANTUS) 100 UNIT/ML SOLOSTAR PEN    Inject 35 Units into the skin daily at 10 pm.   LISINOPRIL (PRINIVIL,ZESTRIL) 20 MG TABLET    Take 1 tablet (20 mg total) by mouth daily.   METFORMIN (GLUCOPHAGE-XR) 500 MG 24 HR  TABLET    Take 4 tablets (2,000 mg total) by mouth daily with supper.   ONDANSETRON (ZOFRAN) 8 MG TABLET    Take 1 tablet (8 mg total) by mouth every 8 (eight) hours as needed for nausea or vomiting.   PREGABALIN (LYRICA) 75 MG CAPSULE    Take 1 capsule (75 mg total) by mouth 2 (two) times daily.  Modified Medications   No medications on file  Discontinued Medications   AMOXICILLIN-CLAVULANATE (AUGMENTIN) 875-125 MG TABLET    Take 1 tablet by mouth 2 (two) times daily.   OXYCODONE-ACETAMINOPHEN (PERCOCET/ROXICET) 5-325 MG TABLET    Take 1-2 tablets by mouth every 8 (eight) hours as needed for moderate pain or severe pain (or prior to dressing change.).    Subjective: Danny Fuller is in for his hospital follow-up visit. He completed therapy for diabetic and foot infection last fall and has had no recurrence. 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 He underwent incision and drainage 2 and was discharged home on oral amoxicillin clavulanate. He is now completed 22 days of antibiotic therapy. Overall he is feeling much better but he is having some problem with nausea that he thinks is related to amoxicillin clavulanate. His wife is changing his dressing twice daily. She notes that there has not been any recent drainage. His last hemoglobin A1c was 11.6. He saw  his endocrinologist, Dr. Dwyane Dee, 2 days ago. He was given a new glucose meter and started on a new long-acting preparation of metformin. He missed his diabetes education visit because of his hospitalization. He has a strong family history of diabetes. His mother died from complications of diabetes. His aunt and sister both have complicated diabetes as well.  Review of Systems: Review of Systems  Constitutional: Negative for chills, diaphoresis, fever, malaise/fatigue and weight loss.  HENT: Negative for sore throat.   Respiratory: Negative for cough, sputum production and shortness of breath.     Cardiovascular: Negative for chest pain.  Gastrointestinal: Positive for nausea and vomiting. Negative for abdominal pain, diarrhea and heartburn.  Genitourinary: Negative for dysuria and frequency.  Musculoskeletal: Negative for joint pain and myalgias.  Skin: Negative for rash.  Neurological: Negative for dizziness and headaches.  Psychiatric/Behavioral: Negative for depression and substance abuse. The patient is not nervous/anxious.     Past Medical History:  Diagnosis Date  . Asthma   . Diabetes mellitus    sees Dr. Dwyane Dee   . GERD (gastroesophageal reflux disease)   . Headache(784.0)   . Hypertension     Social History  Substance Use Topics  . Smoking status: Former Smoker    Types: Cigarettes  . Smokeless tobacco: Never Used     Comment: quit 5 days ago  . Alcohol use No    Family History  Problem Relation Age of Onset  . Arthritis    . Diabetes    . Hypertension    . Hyperlipidemia    . Stroke    . Sudden death    . Diabetes Mother   . Diabetes Sister   . Diabetes Brother     No Known Allergies  Objective: Vitals:   03/30/16 0941  BP: 127/85  Pulse: 97  Temp: 97.4 F (36.3 C)  TempSrc: Oral  Weight: 241 lb (109.3 kg)   Body mass index is 35.59 kg/m.  Physical Exam  Constitutional: He is oriented to person, place, and time.  He is in good spirits.  HENT:  Mouth/Throat: No oropharyngeal exudate.  Cardiovascular: Normal rate and regular rhythm.   No murmur heard. Pulmonary/Chest: Effort normal and breath sounds normal. He has no wheezes. He has no rales.  Abdominal: Soft. There is no tenderness.  Genitourinary:  Genitourinary Comments: He has a surgical wound on his right buttock medially and is packed with clean, dry gauze. The base of the wound is pink and granulating. There is no drainage or odor. There is some mild surrounding induration without fluctuance or tenderness.  Musculoskeletal: Normal range of motion. He exhibits no edema or  tenderness.  He walks with the aid of cane.  Neurological: He is alert and oriented to person, place, and time.  Skin: No rash noted.  Psychiatric: Mood and affect normal.    Lab Results    Problem List Items Addressed This Visit      High   Necrotizing fasciitis ([place])    His infection has responded well to debridement and 3 weeks of antibiotic therapy. I will have him stop his amoxicillin clavulanate now. He can follow-up here as needed.      Poorly controlled diabetes mellitus ([place])    I talked to him about the importance of getting better glycemic control. He does know that his A1c was 11.6 recently and that it needs to be lower. He has been using his new glucose meter. I asked him to call Dr. Ronnie Derby office  and asked to be rescheduled with a diabetic educator. I also encouraged him to get regular exercise.          Michel Bickers, MD [facility] [phone] pager   [phone] cell 03/30/2016, 10:07 AM

## 2016-03-30 NOTE — Assessment & Plan Note (Signed)
I talked to him about the importance of getting better glycemic control. He does know that his A1c was 11.6 recently and that it needs to be lower. He has been using his new glucose meter. I asked him to call Dr. Ronnie Derby office and asked to be rescheduled with a diabetic educator. I also encouraged him to get regular exercise.

## 2016-04-13 ENCOUNTER — Encounter (INDEPENDENT_AMBULATORY_CARE_PROVIDER_SITE_OTHER): Payer: Self-pay | Admitting: Orthopedic Surgery

## 2016-04-13 ENCOUNTER — Ambulatory Visit (INDEPENDENT_AMBULATORY_CARE_PROVIDER_SITE_OTHER): Payer: BC Managed Care – PPO | Admitting: Orthopedic Surgery

## 2016-04-13 DIAGNOSIS — Z89422 Acquired absence of other left toe(s): Secondary | ICD-10-CM | POA: Diagnosis not present

## 2016-04-13 DIAGNOSIS — M6702 Short Achilles tendon (acquired), left ankle: Secondary | ICD-10-CM

## 2016-04-13 DIAGNOSIS — E1142 Type 2 diabetes mellitus with diabetic polyneuropathy: Secondary | ICD-10-CM | POA: Diagnosis not present

## 2016-04-13 NOTE — Progress Notes (Signed)
Office Visit Note   Patient: Danny Fuller           Date of Birth: Nov 27, 1975           MRN: [id number] Visit Date: 04/13/2016              Requested by: [name], MD [place], [place] [address] PCP: [name], MD  Chief Complaint  Patient presents with  . Left Foot - Follow-up    Amputation left fifth ray amputation 07/26/15 ~ 9 months post op    HPI: Patient is a 41 y.o male who presents for left foot 5th ray amputation. He is 9 months post op. He has increase in pain. He has increased activity to build leg muscles back up. He states he was at the mall and could only walk around for 30 minutes due to the pain. He complains of numbness dorsal and plantar left foot. He ambulates with cane today. He states he is taking lyrica, but does not want to increase dosage due to possible side effects. Maxcine Ham, RT   Patient states he was recently hospitalized for a sacral [place] ulcer he states he underwent 2 surgeries. Patient states that he had increased swelling in his lower extremities at the time of surgery. Assessment & Plan: Visit Diagnoses:  1. Diabetic polyneuropathy associated with type 2 diabetes mellitus ([place])   2. Acquired absence of other left toe(s) ([place])   3. Acquired contracture of Achilles tendon, left     Plan: Patient has significant heel cord contractures in the left and lesser contracture on the right. Patient is symptomatic across the forefoot he was given instructions for heel cord stretching this was demonstrated to him he states that he's been trying to do his heel cord stretching. Recommended a stiff soled walking shoe such as a new balance walking shoe. At follow-up in 4 weeks if he is not showing improvement we will need to consider a gastrocnemius recession on the left. Discussed that without aggressive heel cord stretching he is at risk of further ulceration and potential amputation of his foot.  Follow-Up Instructions: Return  in about 4 weeks (around 05/11/2016).   Ortho Exam Examination patient is alert oriented no adenopathy well-dressed normal affect normal respiratory effort. Patient states that the Lyrica is not helping with the left sided foot pain. Complains of numbness in the right foot. Examination is a good dorsalis pedis pulse bilaterally. He has plantar calluses across the forefoot laterally on the right he has calluses laterally on the left status post ray amputation. He has severe heel cord contracture in the left with dorsiflexion 10 short of neutral with his knee extended and dorsiflexion to neutral on the right.  Imaging: No results found.  Labs: Lab Results  Component Value Date   HGBA1C 11.6 (H) 03/06/2016   HGBA1C 13.7 (H) 07/25/2015   HGBA1C 13.6 (H) 07/24/2015   ESRSEDRATE 6 10/10/2015   ESRSEDRATE 68 (H) 07/25/2015   CRP 0.6 10/10/2015   CRP 19.7 (H) 07/25/2015   REPTSTATUS 03/14/2016 FINAL 03/09/2016   GRAMSTAIN  03/07/2016    RARE WBC PRESENT, PREDOMINANTLY PMN ABUNDANT GRAM POSITIVE RODS MODERATE GRAM POSITIVE COCCI IN CLUSTERS MODERATE GRAM NEGATIVE RODS    CULT  03/09/2016    NO GROWTH 5 DAYS Performed at [facility], [place] [address] [place], [place] [address]    [place] 10/10/2015   LABORGA KLEBSIELLA ORNITHINOLYTICA 10/10/2015    Orders:  No orders of the defined types were placed in this encounter.  No orders of the defined types were placed in this encounter.    Procedures: No procedures performed  Clinical Data: No additional findings.  Subjective: Review of Systems  Objective: Vital Signs: There were no vitals taken for this visit.  Specialty Comments:  No specialty comments available.  PMFS History: Patient Active Problem List   Diagnosis Date Noted  . Poorly controlled diabetes mellitus ([place]) 03/30/2016  . Necrotizing fasciitis ([place]) 03/07/2016  . Pyelonephritis 03/04/2016  . Acquired contracture of Achilles  tendon, left 02/15/2016  . Arthralgia of multiple joints 01/19/2016  . Diabetic polyneuropathy associated with type 2 diabetes mellitus ([place]) 01/18/2016  . Acquired absence of other left toe(s) ([place]) 01/18/2016  . Foot amputation status, left ([place]) 01/04/2016  . Peripheral neuropathy ([employer]) 11/11/2015  . Asthma 07/23/2015  . Chronic headache 07/23/2015  . Erectile disorder due to medical condition in male 04/23/2015  . BMI 33.0-33.9,adult 04/21/2012  . GERD 04/21/2008  . Type 2 diabetes mellitus with diabetic neuropathy, with long-term current use of insulin ([place]) 11/21/2006  . Essential hypertension 11/21/2006  . LUMBAR STRAIN 11/21/2006   Past Medical History:  Diagnosis Date  . Asthma   . Diabetes mellitus    sees Dr. Dwyane Dee   . GERD (gastroesophageal reflux disease)   . Headache(784.0)   . Hypertension     Family History  Problem Relation Age of Onset  . Arthritis    . Diabetes    . Hypertension    . Hyperlipidemia    . Stroke    . Sudden death    . Diabetes Mother   . Diabetes Sister   . Diabetes Brother     Past Surgical History:  Procedure Laterality Date  . AMPUTATION Left 07/26/2015   Procedure: AMPUTATION LEFT FIFTH RAY;  Surgeon: [name], MD;  Location: [facility];  Service: Orthopedics;  Laterality: Left;  . bilateral hip pins placed    . INCISION AND DRAINAGE PERIRECTAL ABSCESS Right 03/07/2016   Procedure: IRRIGATION AND DEBRIDEMENT PERIRECTAL ABSCESS;  Surgeon: Alphonsa Overall, MD;  Location: [facility];  Service: General;  Laterality: Right;  . INCISION AND DRAINAGE PERIRECTAL ABSCESS Right 03/09/2016   Procedure: EXAM UNDER ANESTHESIA, IRRIGATION AND DEBRIDEMENT PERIRECTAL ABSCESS;  Surgeon: Johnathan Hausen, MD;  Location: [facility];  Service: General;  Laterality: Right;  Open, betadine packed wound   Social History   Occupational History  . Not on file.   Social History Main Topics  . Smoking status: Former Smoker    Types: Cigarettes  . Smokeless tobacco:  Never Used     Comment: quit 5 days ago  . Alcohol use No  . Drug use: No  . Sexual activity: Not on file

## 2016-04-18 ENCOUNTER — Ambulatory Visit: Payer: BC Managed Care – PPO | Admitting: Registered"

## 2016-04-19 ENCOUNTER — Ambulatory Visit (INDEPENDENT_AMBULATORY_CARE_PROVIDER_SITE_OTHER): Payer: BC Managed Care – PPO | Admitting: Endocrinology

## 2016-04-19 ENCOUNTER — Encounter: Payer: Self-pay | Admitting: Endocrinology

## 2016-04-19 VITALS — BP 158/96 | HR 98 | Ht 69.0 in | Wt 247.0 lb

## 2016-04-19 DIAGNOSIS — Z794 Long term (current) use of insulin: Secondary | ICD-10-CM | POA: Diagnosis not present

## 2016-04-19 DIAGNOSIS — E1165 Type 2 diabetes mellitus with hyperglycemia: Secondary | ICD-10-CM | POA: Diagnosis not present

## 2016-04-19 DIAGNOSIS — I10 Essential (primary) hypertension: Secondary | ICD-10-CM

## 2016-04-19 MED ORDER — FREESTYLE LIBRE READER DEVI: 1.0000 | 0 refills | Status: DC

## 2016-04-19 MED ORDER — FREESTYLE LIBRE SENSOR SYSTEM MISC: 1.0000 | 3 refills | Status: DC

## 2016-04-19 NOTE — Progress Notes (Signed)
Patient ID: Danny Fuller, male   DOB: December 01, 1975, 41 y.o.   MRN: [id number]            Reason for Appointment: Follow-up for Type 2 Diabetes  Referring physician: Sarajane Jews   History of Present Illness:          Date of diagnosis of type 2 diabetes mellitus: 2007        Background history:   At diagnosis he was relatively asymptomatic and was started on metformin and glipizide He has been on the same oral hypoglycemic regimen for several years. Review of his A1c indicates it has been markedly increased persistently with the lowest reading in 2015 being 9.1  Recent history:   INSULIN regimen is:  Lantus 30 units at bedtime Novolog 12 tid plus correction doses       Non-insulin hypoglycemic drugs the patient is taking are: None  He was seen in consultation initially in 8/17 His A1c is still markedly increased at 11.6 as of 1/18  Current management, blood sugar patterns and problems identified:  He was advised to start back on his metformin since his renal function was normal and he can tolerate 2000 mg a day  However blood sugars are still poorly controlled with average 207  Even though he was told to increase his Lantus progressively he has only increased by 5 units right after his visit  FASTING blood sugars are variable but still mostly high  Blood sugars appear to be progressively high during the day and highest before supper  He is however having difficulty getting consistent timing for his Novolog and is sometimes taking it randomly when he is checking his sugar but not always eating soon after that  Still not exercising because of leg pains  His weight has gone up significantly        Side effects from medications have been: None  Compliance with the medical regimen: Inconsistent Hypoglycemia:   none  Glucose monitoring:  done   2-3 times a day         Glucometer:   Verio    Blood Glucose readings by time of day by   Mean values apply above for all meters  except median for One Touch  PRE-MEAL Fasting Lunch Dinner PCS  Overall  Glucose range: 107-250  121-312  137-381  138-373    Mean/median: 153  205  216  188  200   Self-care: Meal times are:  Breakfast is at 10 am : Dinner: 8 pm   Typical meal intake: Breakfast is Usually toast and boiled eggs.  Usually getting cold cut sandwiches at lunch, grilled chicken and vegetables/potatoes at dinner.  Snacks are usually chips, crackers               Dietician visit, most recent: Never               Exercise:  none  Weight history: His highest weight in the past has been 378    Wt Readings from Last 3 Encounters:  04/19/16 247 lb (112 kg)  03/30/16 241 lb (109.3 kg)  03/28/16 238 lb (108 kg)    Glycemic control:   Lab Results  Component Value Date   HGBA1C 11.6 (H) 03/06/2016   HGBA1C 13.7 (H) 07/25/2015   HGBA1C 13.6 (H) 07/24/2015   Lab Results  Component Value Date   MICROALBUR 11.8 (H) 03/23/2016   LDLCALC 51 03/23/2016   CREATININE 0.74 03/14/2016   Lab Results  Component Value  Date   MICRALBCREAT 7.2 03/23/2016       Allergies as of 04/19/2016   No Known Allergies     Medication List       Accurate as of 04/19/16 11:38 AM. Always use your most recent med list.          amLODipine 10 MG tablet Commonly known as:  NORVASC Take 1 tablet (10 mg total) by mouth daily.   cloNIDine 0.1 MG tablet Commonly known as:  CATAPRES Take 1 tablet (0.1 mg total) by mouth 3 (three) times daily.   glucose blood test strip Commonly known as:  ONETOUCH VERIO Use to check blood sugar 3 times per day.   hydrALAZINE 25 MG tablet Commonly known as:  APRESOLINE Take 1 tablet (25 mg total) by mouth every 8 (eight) hours.   insulin aspart 100 UNIT/ML FlexPen Commonly known as:  NOVOLOG FLEXPEN Before each meal 3 times a day, 140-199 - 2 units, 200-250 - 4 units, 251-299 - 6 units,  300-349 - 8 units,  350 or above 10 units. Insulin PEN if approved, provide syringes and needles  if needed.   Insulin Glargine 100 UNIT/ML Solostar Pen Commonly known as:  LANTUS Inject 35 Units into the skin daily at 10 pm.   lisinopril 20 MG tablet Commonly known as:  PRINIVIL,ZESTRIL Take 1 tablet (20 mg total) by mouth daily.   metFORMIN 500 MG 24 hr tablet Commonly known as:  GLUCOPHAGE-XR Take 4 tablets (2,000 mg total) by mouth daily with supper.   ondansetron 8 MG tablet Commonly known as:  ZOFRAN Take 1 tablet (8 mg total) by mouth every 8 (eight) hours as needed for nausea or vomiting.   oxyCODONE-acetaminophen 5-325 MG tablet Commonly known as:  PERCOCET/ROXICET take 1-2 tablets by mouth every 8 hours if needed for MODERATE OR...  (REFER TO PRESCRIPTION NOTES).   pregabalin 75 MG capsule Commonly known as:  LYRICA Take 1 capsule (75 mg total) by mouth 2 (two) times daily.       Allergies: No Known Allergies  Past Medical History:  Diagnosis Date  . Asthma   . Diabetes mellitus    sees Dr. Dwyane Dee   . GERD (gastroesophageal reflux disease)   . Headache(784.0)   . Hypertension     Past Surgical History:  Procedure Laterality Date  . AMPUTATION Left 07/26/2015   Procedure: AMPUTATION LEFT FIFTH RAY;  Surgeon: [name], MD;  Location: [facility];  Service: Orthopedics;  Laterality: Left;  . bilateral hip pins placed    . INCISION AND DRAINAGE PERIRECTAL ABSCESS Right 03/07/2016   Procedure: IRRIGATION AND DEBRIDEMENT PERIRECTAL ABSCESS;  Surgeon: Alphonsa Overall, MD;  Location[facility];  Service: General;  Laterality: Right;  . INCISION AND DRAINAGE PERIRECTAL ABSCESS Right 03/09/2016   Procedure: EXAM UNDER ANESTHESIA, IRRIGATION AND DEBRIDEMENT PERIRECTAL ABSCESS;  Surgeon: Johnathan Hausen, MD;  Location[facility];  Service: General;  Laterality: Right;  Open, betadine packed wound    Family History  Problem Relation Age of Onset  . Arthritis    . Diabetes    . Hypertension    . Hyperlipidemia    . Stroke    . Sudden death    . Diabetes Mother   .  Diabetes Sister   . Diabetes Brother     Social History:  reports that he has quit smoking. His smoking use included Cigarettes. He has never used smokeless tobacco. He reports that he does not drink alcohol or use drugs.  Review of Systems   Lipid history: LDL has been low as also HDL without treatment    Lab Results  Component Value Date   CHOL 110 03/23/2016   HDL 28.70 (L) 03/23/2016   LDLCALC 51 03/23/2016   TRIG 152.0 (H) 03/23/2016   CHOLHDL 4 03/23/2016           Hypertension: On Rx For several years, followed by PCP, is taking lisinopril, amlodipine, clonidine and hydralazine He thinks he is compliant with his medication but his blood pressure is unusually high today  BP Readings from Last 3 Encounters:  04/19/16 (!) 158/96  03/30/16 127/85  03/28/16 112/70    Most recent eye exam was A few years ago  Most recent foot exam: 8/17  He has sharp pains in his feet from neuropathy, recently not on Lyrica and he is asking about starting back, He was started by his other physician not to take this with oxycodone      LABS:  No visits with results within 1 Week(s) from this visit.  Latest known visit with results is:  Lab on 03/23/2016  Component Date Value Ref Range Status  . Cholesterol 03/23/2016 110  0 - 200 mg/dL Final  . Triglycerides 03/23/2016 152.0* 0.0 - 149.0 mg/dL Final  . HDL 03/23/2016 28.70* >39.00 mg/dL Final  . VLDL 03/23/2016 30.4  0.0 - 40.0 mg/dL Final  . LDL Cholesterol 03/23/2016 51  0 - 99 mg/dL Final  . Total CHOL/HDL Ratio 03/23/2016 4   Final  . NonHDL 03/23/2016 81.35   Final  . Fructosamine 03/23/2016 406* 0 - 285 umol/L Final   Comment: Published reference interval for apparently healthy subjects between age 70 and 65 is 66 - 285 umol/L and in a poorly controlled diabetic population is 228 - 563 umol/L with a mean of 396 umol/L.   Marland Kitchen Microalb, Ur 03/23/2016 11.8* 0.0 - 1.9 mg/dL Final  . Creatinine,U 03/23/2016 162.9  mg/dL  Final  . Microalb Creat Ratio 03/23/2016 7.2  0.0 - 30.0 mg/g Final  . Color, Urine 03/23/2016 YELLOW  Yellow;Lt. Yellow Final  . APPearance 03/23/2016 CLEAR  Clear Final  . Specific Gravity, Urine 03/23/2016 1.015  1.000 - 1.030 Final  . pH 03/23/2016 6.0  5.0 - 8.0 Final  . Total Protein, Urine 03/23/2016 TRACE* Negative Final  . Urine Glucose 03/23/2016 NEGATIVE  Negative Final  . Ketones, ur 03/23/2016 NEGATIVE  Negative Final  . Bilirubin Urine 03/23/2016 NEGATIVE  Negative Final  . Hgb urine dipstick 03/23/2016 NEGATIVE  Negative Final  . Urobilinogen, UA 03/23/2016 0.2  0.0 - 1.0 Final  . Leukocytes, UA 03/23/2016 NEGATIVE  Negative Final  . Nitrite 03/23/2016 NEGATIVE  Negative Final  . WBC, UA 03/23/2016 0-2/hpf  0-2/hpf Final  . RBC / HPF 03/23/2016 0-2/hpf  0-2/hpf Final  . Squamous Epithelial / LPF 03/23/2016 Rare(0-4/hpf)  Rare(0-4/hpf) Final    Physical Examination:  BP (!) 158/96   Pulse 98   Ht 5\' 9"  (1.753 m)   Wt 247 lb (112 kg)   SpO2 98%   BMI 36.48 kg/m     ASSESSMENT:  Diabetes type 2, uncontrolled With BMI 36 He has had long-standing diabetes with persistently poor control  See history of present illness for detailed discussion of current diabetes management, blood sugar patterns and problems identified  He is still not getting enough insulin with current regimen Also does not appear to have 24-hour duration of action of Lantus Has not benefited from restarting metformin  Complications of diabetes: Amputation of toe, painful Neuropathy  DIABETIC neuropathy, has not recently taken Lyrica, discussed that he can take this with oxycodone  HYPERTENSION: Advised him to follow-up with PCP, not clear why his blood pressure is fluctuating  PLAN:    Increase basal insulin by adding another 10 units in the morning of the Lantus  Discussed in detail the use of the V-go pump system and he is interested in trying this  This will help his compliance with  covering his meals and snacks on a timely basis which he is not doing now  Also may require less insulin  He does need to exercise and asked him to start Lyrica also he can be more mobile otherwise consider water exercises  He was also shown the freestyle [place] system and discussed how this would be used, this will provide more information and allowed him to control his sugars better and more conveniently  Restart Lyrica, this  does not have any current contraindications  Counseling time on subjects discussed above is over 50% of today's 25 minute visit   Patient Instructions  Add lantus 10 units in am   [facility] 04/19/2016, 11:38 AM   Note: This office note was prepared with Dragon voice recognition system technology. Any transcriptional errors that result from this process are unintentional.

## 2016-04-19 NOTE — Patient Instructions (Signed)
Add lantus 10 units in am

## 2016-05-02 ENCOUNTER — Encounter: Payer: BC Managed Care – PPO | Attending: Family Medicine | Admitting: Registered"

## 2016-05-02 DIAGNOSIS — E119 Type 2 diabetes mellitus without complications: Secondary | ICD-10-CM

## 2016-05-02 DIAGNOSIS — Z794 Long term (current) use of insulin: Secondary | ICD-10-CM | POA: Diagnosis not present

## 2016-05-02 DIAGNOSIS — E1165 Type 2 diabetes mellitus with hyperglycemia: Secondary | ICD-10-CM | POA: Diagnosis not present

## 2016-05-02 DIAGNOSIS — Z713 Dietary counseling and surveillance: Secondary | ICD-10-CM | POA: Diagnosis present

## 2016-05-02 NOTE — Progress Notes (Signed)
Diabetes Self-Management Education  Visit Type: First/Initial  Appt. Start Time: 1055 Appt. End Time: 1220  05/02/2016  [name], identified by name and date of birth, is a 41 y.o. male with a diagnosis of Diabetes: Type 2.   ASSESSMENT Patient reports he hasn't worked since June, since having toes amputated. Patient states 2 sons and wife are supportive and participating in diet and lifestyle changes.   Height 5\' 9"  (1.753 m), weight 249 lb 12.8 oz (113.3 kg). Body mass index is 36.89 kg/m.      Diabetes Self-Management Education - 05/02/16 1102      Visit Information   Visit Type First/Initial     Initial Visit   Diabetes Type Type 2   Are you currently following a meal plan? No   Are you taking your medications as prescribed? Yes   Date Diagnosed 2004     Health Coping   How would you rate your overall health? Fair     Psychosocial Assessment   Patient Belief/Attitude about Diabetes Motivated to manage diabetes   What is the last grade level you completed in school? 12     Complications   Last HgB A1C per patient/outside source 11.3 %  per pt. Jan 2018   How often do you check your blood sugar? 3-4 times/day  fasting & post   Fasting Blood glucose range (mg/dL) 70-129  100-130   Postprandial Blood glucose range (mg/dL) --  150 2hrs. then continues to rise ~260   Number of hypoglycemic episodes per month 3   Number of hyperglycemic episodes per week 2   Have you had a dilated eye exam in the past 12 months? No   Have you had a dental exam in the past 12 months? No   Are you checking your feet? Yes   How many days per week are you checking your feet? 7     Dietary Intake   Breakfast boiled egg, wheat toast, simply OJ 8 oz   Snack (morning) none   Lunch none   Snack (afternoon) none   Dinner baked chicken, green beans, potatoes   Snack (evening) cheese and crackers   Beverage(s) OJ, pepsi zero, water, coffee splenda & half/half     Exercise    Exercise Type Light (walking / raking leaves)  treadmill   How many days per week to you exercise? 3   How many minutes per day do you exercise? 45   Total minutes per week of exercise 135     Patient Education   Previous Diabetes Education Yes (please comment)  4 yrs ago   Disease state  Definition of diabetes, type 1 and 2, and the diagnosis of diabetes;Factors that contribute to the development of diabetes   Nutrition management  Role of diet in the treatment of diabetes and the relationship between the three main macronutrients and blood glucose level;Food label reading, portion sizes and measuring food.;Carbohydrate counting   Physical activity and exercise  Role of exercise on diabetes management, blood pressure control and cardiac health.   Monitoring Identified appropriate SMBG and/or A1C goals.   Acute complications Taught treatment of hypoglycemia - the 15 rule.   Chronic complications Relationship between chronic complications and blood glucose control;Lipid levels, blood glucose control and heart disease     Individualized Goals (developed by patient)   Nutrition General guidelines for healthy choices and portions discussed   Physical Activity Exercise 3-5 times per week   Reducing Risk increase portions of  nuts and seeds     Outcomes   Expected Outcomes Demonstrated interest in learning. Expect positive outcomes   Future DMSE 4-6 wks   Program Status Completed      Individualized Plan for Diabetes Self-Management Training:   Learning Objective:  Patient will have a greater understanding of diabetes self-management. Patient education plan is to attend individual and/or group sessions per assessed needs and concerns.  Patient Instructions  Plan:  Aim to avoid skipping meals Aim for 3-4 Carb Choices per meal (45-60 grams)  Aim for 0-2 Carbs per snack if hungry  Include protein in moderation with your meals and snacks Consider having oatmeal more often with  nuts Consider walnuts and almonds as protein choices to pair with carbs Consider reading food labels for Total Carbohydrate and sodium Consider  increasing your activity to daily as tolerated Continue checking BG at alternate times per day as directed by MD  Continue taking medication as directed by MD  [email or web address].com to look up nutrition facts for restaurant items    Expected Outcomes:  Demonstrated interest in learning. Expect positive outcomes  Education material provided: Living Well with Diabetes, Food label handouts, A1C conversion sheet, My Plate, Snack sheet and Carbohydrate counting sheet  If problems or questions, patient to contact team via:  Phone and Email  Future DSME appointment: 4-6 wks

## 2016-05-02 NOTE — Patient Instructions (Addendum)
Plan:  Aim to avoid skipping meals Aim for 3-4 Carb Choices per meal (45-60 grams)  Aim for 0-2 Carbs per snack if hungry  Include protein in moderation with your meals and snacks Consider having oatmeal more often with nuts Consider walnuts and almonds as protein choices to pair with carbs Consider reading food labels for Total Carbohydrate and sodium Consider  increasing your activity to daily as tolerated Continue checking BG at alternate times per day as directed by MD  Continue taking medication as directed by MD  www.calorieking.com to look up nutrition facts for restaurant items

## 2016-05-03 ENCOUNTER — Encounter: Payer: BC Managed Care – PPO | Admitting: Nutrition

## 2016-05-03 DIAGNOSIS — E1142 Type 2 diabetes mellitus with diabetic polyneuropathy: Secondary | ICD-10-CM

## 2016-05-03 DIAGNOSIS — Z713 Dietary counseling and surveillance: Secondary | ICD-10-CM | POA: Diagnosis not present

## 2016-05-03 DIAGNOSIS — E1140 Type 2 diabetes mellitus with diabetic neuropathy, unspecified: Secondary | ICD-10-CM

## 2016-05-03 DIAGNOSIS — Z794 Long term (current) use of insulin: Secondary | ICD-10-CM

## 2016-05-03 NOTE — Patient Instructions (Signed)
Fill and apply a new v-go every day at the same time Stop all lantus Give 2-3 button presses before all meals. Call if blood sugars drop, or remain over 200.

## 2016-05-03 NOTE — Progress Notes (Signed)
Mr. Hartwig was trained on how to fill, apply and use the V-go.  He was given a V-Go 30 starter kit.  He was not taking the 10u of Lantus in the AM, and said his FBSs are in the 110-115 range.  He fiiled a V-Go 30 with Novolog insulin, but will not apply it until 7PM tonight . (He took his Lantus last night at 10PM).   Written instructions were given to stop all Lantus, and give 2-3 button presses for each meal. He is wearing an Abbott sensor,a nd he will bring his reader with him when he sees Dr. Dwyane Dee in one week.  He had no final questions. He was shown the 800 telephone number to call if questions, and told to call here if blood sugars stay above 200 or drop below 70.   Discussed when it would appropriate to give 3 button presses for each meal.  He is trying to limit carbs in each meal, but was told when eating pizza, or pasta to give 3 button presses.  He was also told to give 1 button press for snacks if eating at HS.  He agreed to do this.

## 2016-05-08 ENCOUNTER — Ambulatory Visit (INDEPENDENT_AMBULATORY_CARE_PROVIDER_SITE_OTHER): Payer: BC Managed Care – PPO | Admitting: Endocrinology

## 2016-05-08 ENCOUNTER — Encounter: Payer: Self-pay | Admitting: Endocrinology

## 2016-05-08 VITALS — BP 150/94 | HR 90 | Ht 69.0 in | Wt 249.0 lb

## 2016-05-08 DIAGNOSIS — E1165 Type 2 diabetes mellitus with hyperglycemia: Secondary | ICD-10-CM

## 2016-05-08 DIAGNOSIS — E1169 Type 2 diabetes mellitus with other specified complication: Secondary | ICD-10-CM

## 2016-05-08 DIAGNOSIS — N521 Erectile dysfunction due to diseases classified elsewhere: Secondary | ICD-10-CM | POA: Insufficient documentation

## 2016-05-08 DIAGNOSIS — I10 Essential (primary) hypertension: Secondary | ICD-10-CM

## 2016-05-08 DIAGNOSIS — Z794 Long term (current) use of insulin: Secondary | ICD-10-CM | POA: Diagnosis not present

## 2016-05-08 MED ORDER — CLONIDINE HCL 0.1 MG/24HR TD PTWK: 0.1000 mg | MEDICATED_PATCH | TRANSDERMAL | 2 refills | Status: DC

## 2016-05-08 MED ORDER — SILDENAFIL CITRATE 20 MG PO TABS: ORAL_TABLET | ORAL | 2 refills | Status: DC

## 2016-05-08 NOTE — Progress Notes (Signed)
Patient ID: Danny Fuller, male   DOB: 14-Nov-1975, 41 y.o.   MRN: [id number]            Reason for Appointment: Follow-up for Type 2 Diabetes  Referring physician: Sarajane Jews   History of Present Illness:          Date of diagnosis of type 2 diabetes mellitus: 2007        Background history:   At diagnosis he was relatively asymptomatic and was started on metformin and glipizide He has been on the same oral hypoglycemic regimen for several years. Review of his A1c indicates it has been markedly increased persistently with the lowest reading in 2015 being 9.1  Recent history:   INSULIN regimen is:  V-go pump with basal 30 units and boluses 4-6 units at meals       Non-insulin hypoglycemic drugs the patient is taking are: Metformin ER 2000 mg daily  He was seen in consultation initially in 8/17 Because of poor control he was started on the V-go pump on 05/03/16.  On his last visit his blood sugars were averaging 200 at home  Current management, blood sugar patterns and problems identified:  He has no trouble using the V-go pump which she has started about 5 days ago  Prior to this his blood sugars were showing significant postprandial hyperglycemia at times  However with trying to improve his diet blood sugars have been coming down; before starting the pump his average blood sugar was running between 146-171 through the day  With starting the pump he still has some fluctuation in his blood sugars, best blood sugar was averaging 03/11/1928 after starting the pump and otherwise ranging from 141-173  His highest readings were on 3/23 when he was hyperglycemic with eating out and having cake at a party  Otherwise he appears to have only modest increases and postprandial readings; he does in that he can be a little more consistent with taking his BOLUS clicks before eating, still learning how to use the pump  He has seen the dietitian and his weight is leveled off now        Side  effects from medications have been: None  Compliance with the medical regimen: Inconsistent Hypoglycemia:   none  Glucose monitoring:  freestyle Libre sensor  Blood Glucose readings by time of day by as above Average for the last 2 weeks 159 with 63% of readings over 140 Highest blood sugars overall between 4-6 PM, averaging 199 FASTING readings 137 on average  Self-care: Meal times are:  Breakfast is at 10 am : Dinner: 8 pm   Typical meal intake: Breakfast is Usually toast and boiled eggs.  Usually getting cold cut sandwiches at lunch, grilled chicken and vegetables/potatoes at dinner.  Snacks are usually chips, crackers               Dietician visit, most recent: 05/02/16               Exercise:  minimal  Weight history: His highest weight in the past has been 378    Wt Readings from Last 3 Encounters:  05/08/16 249 lb (112.9 kg)  05/02/16 249 lb 12.8 oz (113.3 kg)  04/19/16 247 lb (112 kg)    Glycemic control:   Lab Results  Component Value Date   HGBA1C 11.6 (H) 03/06/2016   HGBA1C 13.7 (H) 07/25/2015   HGBA1C 13.6 (H) 07/24/2015   Lab Results  Component Value Date   MICROALBUR 11.8 (H)  03/23/2016   LDLCALC 51 03/23/2016   CREATININE 0.74 03/14/2016   Lab Results  Component Value Date   MICRALBCREAT 7.2 03/23/2016       Allergies as of 05/08/2016   No Known Allergies     Medication List       Accurate as of 05/08/16  8:46 PM. Always use your most recent med list.          amLODipine 10 MG tablet Commonly known as:  NORVASC Take 1 tablet (10 mg total) by mouth daily.   cloNIDine 0.1 mg/24hr patch Commonly known as:  CATAPRES - Dosed in mg/24 hr Place 1 patch (0.1 mg total) onto the skin once a week.   FREESTYLE LIBRE READER Devi 1 Device by Does not apply route as directed.   FREESTYLE LIBRE SENSOR SYSTEM Misc 1 strip by Does not apply route as directed. Apply to upper arm and change sensor every 10 days   glucose blood test strip Commonly  known as:  ONETOUCH VERIO Use to check blood sugar 3 times per day.   hydrALAZINE 25 MG tablet Commonly known as:  APRESOLINE Take 1 tablet (25 mg total) by mouth every 8 (eight) hours.   insulin aspart 100 UNIT/ML FlexPen Commonly known as:  NOVOLOG FLEXPEN Before each meal 3 times a day, 140-199 - 2 units, 200-250 - 4 units, 251-299 - 6 units,  300-349 - 8 units,  350 or above 10 units. Insulin PEN if approved, provide syringes and needles if needed.   lisinopril 20 MG tablet Commonly known as:  PRINIVIL,ZESTRIL Take 1 tablet (20 mg total) by mouth daily.   metFORMIN 500 MG 24 hr tablet Commonly known as:  GLUCOPHAGE-XR Take 4 tablets (2,000 mg total) by mouth daily with supper.   ondansetron 8 MG tablet Commonly known as:  ZOFRAN Take 1 tablet (8 mg total) by mouth every 8 (eight) hours as needed for nausea or vomiting.   oxyCODONE-acetaminophen 5-325 MG tablet Commonly known as:  PERCOCET/ROXICET take 1-2 tablets by mouth every 8 hours if needed for MODERATE OR...  (REFER TO PRESCRIPTION NOTES).   pregabalin 75 MG capsule Commonly known as:  LYRICA Take 1 capsule (75 mg total) by mouth 2 (two) times daily.   sildenafil 20 MG tablet Commonly known as:  REVATIO 2-3 tabs as directed   traMADol 50 MG tablet Commonly known as:  ULTRAM Take 50 mg by mouth every 6 (six) hours as needed.       Allergies: No Known Allergies  Past Medical History:  Diagnosis Date  . Asthma   . Diabetes mellitus    sees Dr. Dwyane Dee   . GERD (gastroesophageal reflux disease)   . Headache(784.0)   . Hypertension     Past Surgical History:  Procedure Laterality Date  . AMPUTATION Left 07/26/2015   Procedure: AMPUTATION LEFT FIFTH RAY;  Surgeon: [name], MD;  Location: [facility];  Service: Orthopedics;  Laterality: Left;  . bilateral hip pins placed    . INCISION AND DRAINAGE PERIRECTAL ABSCESS Right 03/07/2016   Procedure: IRRIGATION AND DEBRIDEMENT PERIRECTAL ABSCESS;  Surgeon: Alphonsa Overall, MD;  Location: [facility];  Service: General;  Laterality: Right;  . INCISION AND DRAINAGE PERIRECTAL ABSCESS Right 03/09/2016   Procedure: EXAM UNDER ANESTHESIA, IRRIGATION AND DEBRIDEMENT PERIRECTAL ABSCESS;  Surgeon: Johnathan Hausen, MD;  Location: [facility];  Service: General;  Laterality: Right;  Open, betadine packed wound    Family History  Problem Relation Age of Onset  .  Arthritis    . Diabetes    . Hypertension    . Hyperlipidemia    . Stroke    . Sudden death    . Diabetes Mother   . Diabetes Sister   . Diabetes Brother     Social History:  reports that he has quit smoking. His smoking use included Cigarettes. He has never used smokeless tobacco. He reports that he does not drink alcohol or use drugs.   Review of Systems   Lipid history: LDL has been low as also HDL without treatment    Lab Results  Component Value Date   CHOL 110 03/23/2016   HDL 28.70 (L) 03/23/2016   LDLCALC 51 03/23/2016   TRIG 152.0 (H) 03/23/2016   CHOLHDL 4 03/23/2016           Hypertension: On Rx For several years, followed by PCP, is taking lisinopril, amlodipine, clonidine and hydralazine He has had inconsistent control and generally is able to take clonidine only twice a day instead of 3 times a day as prescribed Has not taken his medication today  BP Readings from Last 3 Encounters:  05/08/16 (!) 150/94  04/19/16 (!) 158/96  03/30/16 127/85    Most recent eye exam was A few years ago  Most recent foot exam: 8/17  He has sharp pains in his feet from neuropathy, recently on Lyrica and he is still having some pain and numbness   Today is asking about treatment of erectile dysfunction which has been more so lately.  At some point previously his PCP had prescribed Viagra but this was too expensive and these drugs are excluded from his insurance coverage   LABS:  No visits with results within 1 Week(s) from this visit.  Latest known visit with results is:  Lab on 03/23/2016    Component Date Value Ref Range Status  . Cholesterol 03/23/2016 110  0 - 200 mg/dL Final  . Triglycerides 03/23/2016 152.0* 0.0 - 149.0 mg/dL Final  . HDL 03/23/2016 28.70* >39.00 mg/dL Final  . VLDL 03/23/2016 30.4  0.0 - 40.0 mg/dL Final  . LDL Cholesterol 03/23/2016 51  0 - 99 mg/dL Final  . Total CHOL/HDL Ratio 03/23/2016 4   Final  . NonHDL 03/23/2016 81.35   Final  . Fructosamine 03/23/2016 406* 0 - 285 umol/L Final   Comment: Published reference interval for apparently healthy subjects between age 69 and 29 is 110 - 285 umol/L and in a poorly controlled diabetic population is 228 - 563 umol/L with a mean of 396 umol/L.   Marland Kitchen Microalb, Ur 03/23/2016 11.8* 0.0 - 1.9 mg/dL Final  . Creatinine,U 03/23/2016 162.9  mg/dL Final  . Microalb Creat Ratio 03/23/2016 7.2  0.0 - 30.0 mg/g Final  . Color, Urine 03/23/2016 YELLOW  Yellow;Lt. Yellow Final  . APPearance 03/23/2016 CLEAR  Clear Final  . Specific Gravity, Urine 03/23/2016 1.015  1.000 - 1.030 Final  . pH 03/23/2016 6.0  5.0 - 8.0 Final  . Total Protein, Urine 03/23/2016 TRACE* Negative Final  . Urine Glucose 03/23/2016 NEGATIVE  Negative Final  . Ketones, ur 03/23/2016 NEGATIVE  Negative Final  . Bilirubin Urine 03/23/2016 NEGATIVE  Negative Final  . Hgb urine dipstick 03/23/2016 NEGATIVE  Negative Final  . Urobilinogen, UA 03/23/2016 0.2  0.0 - 1.0 Final  . Leukocytes, UA 03/23/2016 NEGATIVE  Negative Final  . Nitrite 03/23/2016 NEGATIVE  Negative Final  . WBC, UA 03/23/2016 0-2/hpf  0-2/hpf Final  . RBC / HPF 03/23/2016  0-2/hpf  0-2/hpf Final  . Squamous Epithelial / LPF 03/23/2016 Rare(0-4/hpf)  Rare(0-4/hpf) Final    Physical Examination:  BP (!) 150/94   Pulse 90   Ht 5\' 9"  (1.753 m)   Wt 249 lb (112.9 kg)   SpO2 98%   BMI 36.77 kg/m     ASSESSMENT:  Diabetes type 2, uncontrolled With BMI 36 He has had long-standing diabetes with Previously persistently poor control  See history of present illness for  detailed discussion of current diabetes management, blood sugar patterns and problems identified  He is starting to show significant improvement in his blood sugars with using metformin, trying to watch his diet more consistently especially with working with the dietitian and nurse educator Also with using the V-go pump his blood sugars are improving and recently averaging 159 Overnight blood sugars are somewhat variable and occasionally higher especially with tendency to Dawn phenomenon He is still trying to get used to doing the V-go pump and not always taking boluses for meals; however still requiring only low doses of insulin for meals  HYPERTENSION: He has not followed up with PCP Some of his poor control is related to inconsistent regimen with clonidine that he is supposed to take 3 times a day  PLAN:    No change in basic insulin regimen  Discussed day-to-day management with the V-go pump  Since he has some variability in blood sugars taken at her next 2 units bolus when his blood sugars are relatively high  Also he will take at least another 2 units for higher carbohydrate meals  Continue metformin  Hopefully can start exercising  For his HYPERTENSION he will be changed from the clonidine to the Catapres patch 0.1 mg weekly and discussed need for better control of blood pressure  Given prescription for generic sildenafil 20 mg and he can take 60-80 mg of this as needed for erectile dysfunction  Counseling time on subjects discussed above is over 50% of today's 25 minute visit   There are no Patient Instructions on file for this visit.  Shevaun Lovan 05/08/2016, 8:46 PM   Note: This office note was prepared with Dragon voice recognition system technology. Any transcriptional errors that result from this process are unintentional.

## 2016-05-09 ENCOUNTER — Other Ambulatory Visit: Payer: Self-pay

## 2016-05-09 MED ORDER — INSULIN ASPART 100 UNIT/ML ~~LOC~~ SOLN: 66.0000 [IU] | Freq: Three times a day (TID) | SUBCUTANEOUS | 4 refills | Status: DC

## 2016-05-09 MED ORDER — V-GO 30 KIT: PACK | 4 refills | Status: DC

## 2016-05-11 ENCOUNTER — Ambulatory Visit (INDEPENDENT_AMBULATORY_CARE_PROVIDER_SITE_OTHER): Payer: BC Managed Care – PPO | Admitting: Family

## 2016-05-11 DIAGNOSIS — Z89422 Acquired absence of other left toe(s): Secondary | ICD-10-CM | POA: Diagnosis not present

## 2016-05-11 DIAGNOSIS — M6702 Short Achilles tendon (acquired), left ankle: Secondary | ICD-10-CM

## 2016-05-15 NOTE — Progress Notes (Signed)
Office Visit Note   Patient: Danny Fuller           Date of Birth: [birth year]           MRN: [id number] Visit Date: 05/11/2016              Requested by: [name], MD [place], [place] [id number] PCP: [name], MD  No chief complaint on file.     HPI: The patient is a 41 year old gentleman who presents today for evaluation of heel cord contracture and pain in left foot. 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 He is taking Lyrica for his phantom pain, states this is working well. Does take tramadol as well. States his primary care provider and he have determined he is currently unable to return to work due to his ongoing use of Lyrica as well as tramadol. Is ambulating curing a cane in his hand not using this for assistance today in regular shoewear. No new concerns. Does request a disability parking form filled out.  Assessment & Plan: Visit Diagnoses:  1. Acquired contracture of Achilles tendon, left   2. Acquired absence of other left toe(s) ([place])     Plan: Encouraged him to continue working on heel cord stretching. Patient will follow up with [name] in the office as needed. Is following closely with his primary care provider who will determine his return to work status. States the Lyrica is working okay for his phantom pain some we will continue this. Given a disability parking placard today.  Follow-Up Instructions: Return if symptoms worsen or fail to improve.   Ortho Exam  Patient is alert, oriented, no adenopathy, well-dressed, normal affect, normal respiratory effort. Left foot is well healed. There is no open area drainage moderate swelling. No erythema no sign of infection. Does have heel cord tightness still with dorsiflexion to neutral. Is no breakdown plantar aspect of his foot. No buildup of callus.  Imaging: No results found.  Labs: Lab Results  Component Value Date   HGBA1C 11.6 (H) 03/06/2016   HGBA1C  13.7 (H) 07/25/2015   HGBA1C 13.6 (H) 07/24/2015   ESRSEDRATE 6 10/10/2015   ESRSEDRATE 68 (H) 07/25/2015   CRP 0.6 10/10/2015   CRP 19.7 (H) 07/25/2015   REPTSTATUS 03/14/2016 FINAL 03/09/2016   GRAMSTAIN  03/07/2016    RARE WBC PRESENT, PREDOMINANTLY PMN ABUNDANT GRAM POSITIVE RODS MODERATE GRAM POSITIVE COCCI IN CLUSTERS MODERATE GRAM NEGATIVE RODS    CULT  03/09/2016    NO GROWTH 5 DAYS Performed at [facility], [place] [address] [place], [place] [address]    [place] 10/10/2015   [facility] 10/10/2015    Orders:  No orders of the defined types were placed in this encounter.  No orders of the defined types were placed in this encounter.    Procedures: No procedures performed  Clinical Data: No additional findings.  ROS:  All other systems negative, except as noted in the HPI. Review of Systems  Constitutional: Negative for chills and fever.  Cardiovascular: Negative for leg swelling.  Musculoskeletal: Positive for arthralgias and gait problem.  Skin: Negative for wound.    Objective: Vital Signs: There were no vitals taken for this visit.  Specialty Comments:  No specialty comments available.  PMFS History: Patient Active Problem List   Diagnosis Date Noted  . Erectile dysfunction associated with type 2 diabetes mellitus ([place]) 05/08/2016  .  Poorly controlled diabetes mellitus ([place]) 03/30/2016  . Necrotizing fasciitis ([place]) 03/07/2016  . Pyelonephritis 03/04/2016  . Acquired contracture of Achilles tendon, left 02/15/2016  . Arthralgia of multiple joints 01/19/2016  . Diabetic polyneuropathy associated with type 2 diabetes mellitus ([place]) 01/18/2016  . Acquired absence of other left toe(s) ([place]) 01/18/2016  . Foot amputation status, left ([place]) 01/04/2016  . Peripheral neuropathy ([place]) 11/11/2015  . Asthma 07/23/2015  . Chronic headache 07/23/2015  . Erectile disorder due to medical condition in male  04/23/2015  . BMI 33.0-33.9,adult 04/21/2012  . GERD 04/21/2008  . Type 2 diabetes mellitus with diabetic neuropathy, with long-term current use of insulin ([place]) 11/21/2006  . Essential hypertension 11/21/2006  . LUMBAR STRAIN 11/21/2006   Past Medical History:  Diagnosis Date  . Asthma   . Diabetes mellitus    sees Dr. Dwyane Dee   . GERD (gastroesophageal reflux disease)   . Headache(784.0)   . Hypertension     Family History  Problem Relation Age of Onset  . Arthritis    . Diabetes    . Hypertension    . Hyperlipidemia    . Stroke    . Sudden death    . Diabetes Mother   . Diabetes Sister   . Diabetes Brother     Past Surgical History:  Procedure Laterality Date  . AMPUTATION Left 07/26/2015   Procedure: AMPUTATION LEFT FIFTH RAY;  Surgeon: [name], MD;  Location: [facility];  Service: Orthopedics;  Laterality: Left;  . bilateral hip pins placed    . INCISION AND DRAINAGE PERIRECTAL ABSCESS Right 03/07/2016   Procedure: IRRIGATION AND DEBRIDEMENT PERIRECTAL ABSCESS;  Surgeon: Alphonsa Overall, MD;  Location: [facility];  Service: General;  Laterality: Right;  . INCISION AND DRAINAGE PERIRECTAL ABSCESS Right 03/09/2016   Procedure: EXAM UNDER ANESTHESIA, IRRIGATION AND DEBRIDEMENT PERIRECTAL ABSCESS;  Surgeon: Johnathan Hausen, MD;  Location: [facility];  Service: General;  Laterality: Right;  Open, betadine packed wound   Social History   Occupational History  . Not on file.   Social History Main Topics  . Smoking status: Former Smoker    Types: Cigarettes  . Smokeless tobacco: Never Used     Comment: quit 5 days ago  . Alcohol use No  . Drug use: No  . Sexual activity: Not on file

## 2016-05-25 ENCOUNTER — Encounter: Payer: Self-pay | Admitting: Podiatry

## 2016-05-29 ENCOUNTER — Telehealth: Payer: Self-pay | Admitting: Podiatry

## 2016-05-29 ENCOUNTER — Encounter: Payer: Self-pay | Admitting: Family Medicine

## 2016-05-29 ENCOUNTER — Ambulatory Visit (INDEPENDENT_AMBULATORY_CARE_PROVIDER_SITE_OTHER): Payer: BC Managed Care – PPO | Admitting: Family Medicine

## 2016-05-29 VITALS — BP 178/100 | Temp 98.2°F | Ht 69.0 in | Wt 252.0 lb

## 2016-05-29 DIAGNOSIS — Z794 Long term (current) use of insulin: Secondary | ICD-10-CM

## 2016-05-29 DIAGNOSIS — E1140 Type 2 diabetes mellitus with diabetic neuropathy, unspecified: Secondary | ICD-10-CM

## 2016-05-29 DIAGNOSIS — E1142 Type 2 diabetes mellitus with diabetic polyneuropathy: Secondary | ICD-10-CM

## 2016-05-29 DIAGNOSIS — I10 Essential (primary) hypertension: Secondary | ICD-10-CM

## 2016-05-29 DIAGNOSIS — R600 Localized edema: Secondary | ICD-10-CM

## 2016-05-29 MED ORDER — FUROSEMIDE 20 MG PO TABS: 20.0000 mg | ORAL_TABLET | Freq: Every day | ORAL | 3 refills | Status: DC

## 2016-05-29 MED ORDER — AMLODIPINE BESY-BENAZEPRIL HCL 10-40 MG PO CAPS: 1.0000 | ORAL_CAPSULE | Freq: Every day | ORAL | 3 refills | Status: DC

## 2016-05-29 NOTE — Patient Instructions (Signed)
WE NOW OFFER   Danny Fuller's FAST TRACK!!!  SAME DAY Appointments for ACUTE CARE  Such as: Sprains, Injuries, cuts, abrasions, rashes, muscle pain, joint pain, back pain Colds, flu, sore throats, headache, allergies, cough, fever  Ear pain, sinus and eye infections Abdominal pain, nausea, vomiting, diarrhea, upset stomach Animal/insect bites  3 Easy Ways to Schedule: Walk-In Scheduling Call in scheduling Mychart Sign-up: [email or web address]         

## 2016-05-29 NOTE — Progress Notes (Signed)
   Subjective:    Patient ID: Danny Fuller, male    DOB: 22-Jun-1975, 41 y.o.   MRN: [id number]  HPI Here to follow up and to discuss lower leg swelling and increased BP. He sees Dr. Dwyane Dee for his diabetes and he got on an insulin pump last month. This has been working quite well. However his BP has been creeping up and he feels like he is retaining fluid. His feet and lower legs are painful, and he says this is a different type of pain than the neuropathy pain. No SOB. His renal function was normal on labs he had drawn in January.    Review of Systems  Constitutional: Positive for unexpected weight change.  Respiratory: Negative.   Cardiovascular: Positive for leg swelling. Negative for chest pain and palpitations.  Musculoskeletal: Positive for myalgias.  Neurological: Negative.        Objective:   Physical Exam  Constitutional: He is oriented to person, place, and time. He appears well-developed and well-nourished.  Cardiovascular: Normal rate, regular rhythm, normal heart sounds and intact distal pulses.   Pulmonary/Chest: Effort normal and breath sounds normal. No respiratory distress. He has no wheezes. He has no rales.  Musculoskeletal:  Trace ankle edema   Neurological: He is alert and oriented to person, place, and time.          Assessment & Plan:  His diabetes is well controlled. His HTN has worsened and this is in part due to fluid retention. Start on Lasix 20 mg daily. Recheck in 2-3 weeks.  [name], MD

## 2016-05-29 NOTE — Telephone Encounter (Signed)
Left vm for pt to call to schedule appt

## 2016-05-29 NOTE — Progress Notes (Signed)
Pre visit review using our clinic review tool, if applicable. No additional management support is needed unless otherwise documented below in the visit note. 

## 2016-06-06 ENCOUNTER — Ambulatory Visit: Payer: BC Managed Care – PPO | Admitting: Registered"

## 2016-06-15 ENCOUNTER — Other Ambulatory Visit: Payer: BC Managed Care – PPO

## 2016-06-19 ENCOUNTER — Other Ambulatory Visit (INDEPENDENT_AMBULATORY_CARE_PROVIDER_SITE_OTHER): Payer: BC Managed Care – PPO

## 2016-06-19 ENCOUNTER — Ambulatory Visit: Payer: BC Managed Care – PPO | Admitting: Endocrinology

## 2016-06-19 ENCOUNTER — Encounter: Payer: Self-pay | Admitting: Podiatry

## 2016-06-19 ENCOUNTER — Encounter: Payer: Self-pay | Admitting: Endocrinology

## 2016-06-19 DIAGNOSIS — E1165 Type 2 diabetes mellitus with hyperglycemia: Secondary | ICD-10-CM | POA: Diagnosis not present

## 2016-06-19 DIAGNOSIS — Z794 Long term (current) use of insulin: Secondary | ICD-10-CM

## 2016-06-19 LAB — COMPREHENSIVE METABOLIC PANEL
ALT: 12 U/L (ref 0–53)
AST: 13 U/L (ref 0–37)
Albumin: 4.3 g/dL (ref 3.5–5.2)
Alkaline Phosphatase: 70 U/L (ref 39–117)
BUN: 22 mg/dL (ref 6–23)
CALCIUM: 9.6 mg/dL (ref 8.4–10.5)
CO2: 25 meq/L (ref 19–32)
Chloride: 102 mEq/L (ref 96–112)
Creatinine, Ser: 1.18 mg/dL (ref 0.40–1.50)
GFR: 88.41 mL/min (ref >60.00)
Glucose, Bld: 246 mg/dL — ABNORMAL HIGH (ref 70–99)
Potassium: 4.6 mEq/L (ref 3.5–5.1)
SODIUM: 134 meq/L — AB (ref 135–145)
Total Bilirubin: 0.4 mg/dL (ref 0.2–1.2)
Total Protein: 7.6 g/dL (ref 6.0–8.3)

## 2016-06-19 LAB — HEMOGLOBIN A1C: HEMOGLOBIN A1C: 8.4 % — AB (ref 4.6–6.5)

## 2016-06-22 ENCOUNTER — Encounter: Payer: Self-pay | Admitting: Endocrinology

## 2016-06-22 ENCOUNTER — Ambulatory Visit (INDEPENDENT_AMBULATORY_CARE_PROVIDER_SITE_OTHER): Payer: BC Managed Care – PPO | Admitting: Endocrinology

## 2016-06-22 VITALS — BP 142/104 | HR 93 | Ht 69.0 in | Wt 248.8 lb

## 2016-06-22 DIAGNOSIS — I10 Essential (primary) hypertension: Secondary | ICD-10-CM | POA: Diagnosis not present

## 2016-06-22 DIAGNOSIS — E1165 Type 2 diabetes mellitus with hyperglycemia: Secondary | ICD-10-CM

## 2016-06-22 DIAGNOSIS — Z794 Long term (current) use of insulin: Secondary | ICD-10-CM

## 2016-06-22 MED ORDER — SILDENAFIL CITRATE 100 MG PO TABS: 50.0000 mg | ORAL_TABLET | Freq: Every day | ORAL | 2 refills | Status: DC | PRN

## 2016-06-22 MED ORDER — CHLORTHALIDONE 25 MG PO TABS: 25.0000 mg | ORAL_TABLET | Freq: Every day | ORAL | 2 refills | Status: DC

## 2016-06-22 NOTE — Patient Instructions (Signed)
Calorie [employer] app for phone  Try 6 clicks for usual meals  3 clicks for salads

## 2016-06-22 NOTE — Progress Notes (Signed)
Patient ID: Danny Fuller, male   DOB: September 18, 1975, 41 y.o.   MRN: [id number]            Reason for Appointment: Follow-up for Type 2 Diabetes  Referring physician: Sarajane Jews   History of Present Illness:          Date of diagnosis of type 2 diabetes mellitus: 2007        Background history:   At diagnosis he was relatively asymptomatic and was started on metformin and glipizide He has been on the same oral hypoglycemic regimen for several years. Review of his A1c indicates it has been markedly increased persistently with the lowest reading in 2015 being 9.1  Recent history:   INSULIN regimen is:  V-go pump with basal 30 units and boluses 4 units at meals       Non-insulin hypoglycemic drugs the patient is taking are: Metformin ER 2000 mg daily  His A1c is now 8.4%, previously 11.6  He was seen in consultation initially in 8/17 Because of poor control he was started on the V-go pump on 05/03/16.  Current management, blood sugar patterns and problems identified:  He does not appear to have any improvement in his blood sugars with continuing the V-go pump  Most of his hyperglycemia appears to be postprandial  Also his blood sugars depend on what kind of diet he is following  He has seen the dietitian in March but has not cut back on his high-fat meals consistently  FASTING blood sugars have been variable although today was the lowest at 104, most of his blood sugars are higher late at night and on an average are not lower than about 160 overnight  His postprandial readings can be as high as 300+ with meals like pasta with cream sauce about today his glucose was high about 100 mg with eating a chicken biscuit  However with eating a salad yesterday afternoon his blood sugars are relatively flat around 140 even though he took 8 units of bolus  He has done only a little bit of physical activity and is limited by neuropathy  DAILY average blood sugar this month has been ranging  between 193 and 237, he does try to scan 8-12 times a day on his sensor        Side effects from medications have been: None  Compliance with the medical regimen: Inconsistent Hypoglycemia:   none  Glucose monitoring:  freestyle Libre sensor  Blood Glucose readings by time of day  Mean values apply above for all meters except median for One Touch  PRE-MEAL Fasting Lunch Dinner Bedtime Overall  Glucose range:       Mean/median: 178  224  228  217  204   POST-MEAL PC Breakfast PC Lunch PC Dinner  Glucose range:     Mean/median: 207  219  220     Self-care: Meal times are:  Breakfast is at 10 am, Lunch-1 PM : Dinner: 8 pm   Typical meal intake: Breakfast is Usually toast and boiled eggs.  Usually getting cold cut sandwiches at lunch, grilled chicken and vegetables/potatoes at dinner.  Snacks are usually chips, crackers               Dietician visit, most recent: 05/02/16               Exercise:  some walking  Weight history: His highest weight in the past has been 378    Wt Readings from Last 3 Encounters:  06/22/16 248 lb 12.8 oz (112.9 kg)  05/29/16 252 lb (114.3 kg)  05/08/16 249 lb (112.9 kg)    Glycemic control:   Lab Results  Component Value Date   HGBA1C 8.4 (H) 06/19/2016   HGBA1C 11.6 (H) 03/06/2016   HGBA1C 13.7 (H) 07/25/2015   Lab Results  Component Value Date   MICROALBUR 11.8 (H) 03/23/2016   LDLCALC 51 03/23/2016   CREATININE 1.18 06/19/2016   Lab Results  Component Value Date   MICRALBCREAT 7.2 03/23/2016       Allergies as of 06/22/2016   No Known Allergies     Medication List       Accurate as of 06/22/16 10:52 AM. Always use your most recent med list.          amLODipine-benazepril 10-40 MG capsule Commonly known as:  LOTREL Take 1 capsule by mouth daily.   cloNIDine 0.1 mg/24hr patch Commonly known as:  CATAPRES - Dosed in mg/24 hr Place 1 patch (0.1 mg total) onto the skin once a week.   FREESTYLE LIBRE READER Devi 1  Device by Does not apply route as directed.   FREESTYLE LIBRE SENSOR SYSTEM Misc 1 strip by Does not apply route as directed. Apply to upper arm and change sensor every 10 days   furosemide 20 MG tablet Commonly known as:  LASIX Take 1 tablet (20 mg total) by mouth daily.   glucose blood test strip Commonly known as:  ONETOUCH VERIO Use to check blood sugar 3 times per day.   hydrALAZINE 25 MG tablet Commonly known as:  APRESOLINE Take 1 tablet (25 mg total) by mouth every 8 (eight) hours.   insulin aspart 100 UNIT/ML injection Commonly known as:  NOVOLOG Inject 66 Units into the skin 3 (three) times daily before meals.   metFORMIN 500 MG 24 hr tablet Commonly known as:  GLUCOPHAGE-XR Take 4 tablets (2,000 mg total) by mouth daily with supper.   ondansetron 8 MG tablet Commonly known as:  ZOFRAN Take 1 tablet (8 mg total) by mouth every 8 (eight) hours as needed for nausea or vomiting.   oxyCODONE-acetaminophen 5-325 MG tablet Commonly known as:  PERCOCET/ROXICET take 1-2 tablets by mouth every 8 hours if needed for MODERATE OR...  (REFER TO PRESCRIPTION NOTES).   pregabalin 75 MG capsule Commonly known as:  LYRICA Take 1 capsule (75 mg total) by mouth 2 (two) times daily.   sildenafil 20 MG tablet Commonly known as:  REVATIO 2-3 tabs as directed   traMADol 50 MG tablet Commonly known as:  ULTRAM Take 50 mg by mouth every 6 (six) hours as needed.   V-GO 30 Kit Use 1 kit a day with insulin       Allergies: No Known Allergies  Past Medical History:  Diagnosis Date  . Asthma   . Diabetes mellitus    sees Dr. Dwyane Dee   . GERD (gastroesophageal reflux disease)   . Headache(784.0)   . Hypertension     Past Surgical History:  Procedure Laterality Date  . AMPUTATION Left 07/26/2015   Procedure: AMPUTATION LEFT FIFTH RAY;  Surgeon: [name], MD;  Location: [place];  Service: Orthopedics;  Laterality: Left;  . bilateral hip pins placed    . INCISION AND  DRAINAGE PERIRECTAL ABSCESS Right 03/07/2016   Procedure: IRRIGATION AND DEBRIDEMENT PERIRECTAL ABSCESS;  Surgeon: Alphonsa Overall, MD;  Location: [facility];  Service: General;  Laterality: Right;  . INCISION AND DRAINAGE PERIRECTAL ABSCESS Right 03/09/2016  Procedure: EXAM UNDER ANESTHESIA, IRRIGATION AND DEBRIDEMENT PERIRECTAL ABSCESS;  Surgeon: Johnathan Hausen, MD;  Location: [facility];  Service: General;  Laterality: Right;  Open, betadine packed wound    Family History  Problem Relation Age of Onset  . Arthritis Unknown   . Diabetes Unknown   . Hypertension Unknown   . Hyperlipidemia Unknown   . Stroke Unknown   . Sudden death Unknown   . Diabetes Mother   . Diabetes Sister   . Diabetes Brother     Social History:  reports that he has quit smoking. His smoking use included Cigarettes. He has never used smokeless tobacco. He reports that he does not drink alcohol or use drugs.   Review of Systems   Lipid history: LDL has been low as also HDL without treatment    Lab Results  Component Value Date   CHOL 110 03/23/2016   HDL 28.70 (L) 03/23/2016   LDLCALC 51 03/23/2016   TRIG 152.0 (H) 03/23/2016   CHOLHDL 4 03/23/2016           Hypertension: On Rx For several years, followed by PCP, is taking Lotrel, clonidine patch and hydralazine  He has had poor control of his blood pressure, recently seen by PCP Not on a diuretic antihypertensive except Lasix 20 mg which he takes for mild edema No history of hypokalemia, currently potassium 4.6  BP Readings from Last 3 Encounters:  06/22/16 (!) 142/104  05/29/16 (!) 178/100  05/08/16 (!) 150/94    Most recent eye exam was A few years ago  Most recent foot exam: 8/17  He has sharp pains in his feet from neuropathy,  on Lyrica and he is still having Significant pain and numbness, is due to see a neurologist   He was given Generic sildenafil but he thinks that his insurance will not pay for Viagra, not benefiting from 60 mg of  sildenafil   LABS:  Lab on 06/19/2016  Component Date Value Ref Range Status  . Hgb A1c MFr Bld 06/19/2016 8.4* 4.6 - 6.5 % Final   Glycemic Control Guidelines for People with Diabetes:Non Diabetic:  <6%Goal of Therapy: <7%Additional Action Suggested:  >8%   . Sodium 06/19/2016 134* 135 - 145 mEq/L Final  . Potassium 06/19/2016 4.6  3.5 - 5.1 mEq/L Final  . Chloride 06/19/2016 102  96 - 112 mEq/L Final  . CO2 06/19/2016 25  19 - 32 mEq/L Final  . Glucose, Bld 06/19/2016 246* 70 - 99 mg/dL Final  . BUN 06/19/2016 22  6 - 23 mg/dL Final  . Creatinine, Ser 06/19/2016 1.18  0.40 - 1.50 mg/dL Final  . Total Bilirubin 06/19/2016 0.4  0.2 - 1.2 mg/dL Final  . Alkaline Phosphatase 06/19/2016 70  39 - 117 U/L Final  . AST 06/19/2016 13  0 - 37 U/L Final  . ALT 06/19/2016 12  0 - 53 U/L Final  . Total Protein 06/19/2016 7.6  6.0 - 8.3 g/dL Final  . Albumin 06/19/2016 4.3  3.5 - 5.2 g/dL Final  . Calcium 06/19/2016 9.6  8.4 - 10.5 mg/dL Final  . GFR 06/19/2016 88.41  >60.00 mL/min Final    Physical Examination:  BP (!) 142/104   Pulse 93   Ht _0  (1.753 m)   Wt 248 lb 12.8 oz (112.9 kg)   SpO2 98%   BMI 36.74 kg/m     ASSESSMENT:  Diabetes type 2, uncontrolled With BMI 36 He has had long-standing diabetes with Previously persistently poor  control  See history of present illness for detailed discussion of current diabetes management, blood sugar patterns and problems identified  Although his blood sugars appeared to be improving right after starting the V-go pump and continuing metformin his blood sugars are overall poorly controlled He has higher readings after meals, occasionally over 300 based on type of meal including and especially high-fat meals which he is not paying attention to He is not taking enough bolus for his meals even when he is seeing his blood sugars are going higher Also not adding extra boluses for higher fat were higher carbohydrate meals  HYPERTENSION:  Poor control  PLAN:    No change in basal rate as yet since some of his morning sugars are improving and near-normal today  Discussed need for higher boluses especially with higher carbohydrate or higher fat meals  He can try to use his smart phone to look up calorie and carbohydrate/fat content of various meals  He needs to switch to low fat meals consistently  If he is eating a salad without carbohydrates he can take 6 units bolus  Also take additional 1-2 clicks for higher preprandial readings  Consider 40 unit basal only if fasting readings are consistently high  Continue metformin but he can take all 4 tablets together and not spread them out  Hopefully can start exercising with control of neuropathy  For his HYPERTENSION he will be started on chlorthalidone 25 mg daily as this would be the most beneficial drug for his ethnic status  Given prescription for Viagra 100 mg  Follow-up with neurologist for NEUROPATHY, may also benefit from pain clinic or adding nortriptyline  Counseling time on subjects discussed above is over 50% of today's 25 minute visit   Patient Instructions  Calorie [employer] app for phone  Try 6 clicks for usual meals  3 clicks for salads     Paulanthony Gleaves 06/22/2016, 10:52 AM   Note: This office note was prepared with Dragon voice recognition system technology. Any transcriptional errors that result from this process are unintentional.

## 2016-07-27 ENCOUNTER — Telehealth: Payer: Self-pay | Admitting: Endocrinology

## 2016-07-27 ENCOUNTER — Encounter: Payer: Self-pay | Admitting: Family Medicine

## 2016-07-27 ENCOUNTER — Other Ambulatory Visit (INDEPENDENT_AMBULATORY_CARE_PROVIDER_SITE_OTHER): Payer: BC Managed Care – PPO

## 2016-07-27 DIAGNOSIS — Z794 Long term (current) use of insulin: Secondary | ICD-10-CM | POA: Diagnosis not present

## 2016-07-27 DIAGNOSIS — E1165 Type 2 diabetes mellitus with hyperglycemia: Secondary | ICD-10-CM | POA: Diagnosis not present

## 2016-07-27 LAB — BASIC METABOLIC PANEL
BUN: 19 mg/dL (ref 6–23)
CHLORIDE: 102 meq/L (ref 96–112)
CO2: 26 meq/L (ref 19–32)
Calcium: 9.7 mg/dL (ref 8.4–10.5)
Creatinine, Ser: 1.13 mg/dL (ref 0.40–1.50)
GFR: 92.89 mL/min (ref >60.00)
GLUCOSE: 154 mg/dL — AB (ref 70–99)
POTASSIUM: 4.4 meq/L (ref 3.5–5.1)
SODIUM: 135 meq/L (ref 135–145)

## 2016-07-27 NOTE — Telephone Encounter (Signed)
Pt needs a note for Social Services for his disability stating what Dr. Dwyane Dee is treating him for and all of his symptoms to show why he cannot work.  Please call patient when complete.

## 2016-07-27 NOTE — Telephone Encounter (Signed)
Have him make an OV for [name] to do this together

## 2016-07-28 LAB — FRUCTOSAMINE: FRUCTOSAMINE: 334 umol/L — AB (ref 0–285)

## 2016-07-28 NOTE — Telephone Encounter (Signed)
Is this okay to complete? If so, please advise exactly what needs to be put in the note. Thanks!

## 2016-07-28 NOTE — Telephone Encounter (Signed)
Called patient and left a voice message that we could offer him his last office visit note and that he can pick up at the office or have [name] mail to him, just needs to let us know.

## 2016-07-28 NOTE — Telephone Encounter (Signed)
We don't normally write notes for disability, they can request office notes

## 2016-07-31 ENCOUNTER — Other Ambulatory Visit: Payer: Self-pay

## 2016-07-31 ENCOUNTER — Ambulatory Visit (INDEPENDENT_AMBULATORY_CARE_PROVIDER_SITE_OTHER): Payer: BC Managed Care – PPO | Admitting: Endocrinology

## 2016-07-31 ENCOUNTER — Encounter: Payer: Self-pay | Admitting: Endocrinology

## 2016-07-31 VITALS — BP 128/96 | HR 100 | Ht 69.0 in | Wt 249.6 lb

## 2016-07-31 DIAGNOSIS — E1165 Type 2 diabetes mellitus with hyperglycemia: Secondary | ICD-10-CM | POA: Diagnosis not present

## 2016-07-31 DIAGNOSIS — Z794 Long term (current) use of insulin: Secondary | ICD-10-CM

## 2016-07-31 DIAGNOSIS — I10 Essential (primary) hypertension: Secondary | ICD-10-CM | POA: Diagnosis not present

## 2016-07-31 MED ORDER — EMPAGLIFLOZIN 10 MG PO TABS: 10.0000 mg | ORAL_TABLET | Freq: Every day | ORAL | 3 refills | Status: DC

## 2016-07-31 MED ORDER — "INSULIN SYRINGE-NEEDLE U-100 31G X 5/16"" 0.3 ML MISC": 1 refills | Status: DC

## 2016-07-31 NOTE — Progress Notes (Signed)
Patient ID: Danny Fuller, male   DOB: 1975-08-30, 41 y.o.   MRN: [id number]            Reason for Appointment: Follow-up for Type 2 Diabetes  Referring physician: Sarajane Jews   History of Present Illness:          Date of diagnosis of type 2 diabetes mellitus: 2007        Background history:   At diagnosis he was relatively asymptomatic and was started on metformin and glipizide He has been on the same oral hypoglycemic regimen for several years. Review of his A1c indicates it has been markedly increased persistently with the lowest reading in 2015 being 9.1  Recent history:   INSULIN regimen is:  V-go pump with basal 30 units and boluses 4-6 units at meals       Non-insulin hypoglycemic drugs the patient is taking are: Metformin ER 2000 mg daily Because of poor control he was started on the V-go pump on 05/03/16.  His A1c is most recently 8.4%, previously 11.6 His fructosamine is still relatively higher at 334   Current management, blood sugar patterns and problems identified:  He was told to improve his diet with cutting back on high-fat meals consistently on his last visit  Also was told to increase his mealtime coverage, previously taking only 8 units per meal  Since his fasting readings were not consistently high he was not moved up to the 40 unit basal  does not appear to have any improvement in his blood sugars with continuing the V-go pump  Most of his hyperglycemia appears to be postprandial  Again his blood sugars are the lowest fasting but still not averaging below 170 and also appears to be more consistently high  He is better compliant with metformin taking all 4 tablets together        Side effects from medications have been: None  Compliance with the medical regimen: Improving Hypoglycemia:   none  Glucose monitoring:  freestyle Libre sensor  Blood Glucose readings by time of day from download analysis:  Mean values apply above for all meters except  median for One Touch  PRE-MEAL Fasting Lunch Dinner Bedtime Overall  Glucose range:  1 74-292        Mean/median: 171  186  235  245  204    POST-MEAL PC Breakfast PC Lunch PC Dinner  Glucose range:   209-309   Mean/median:       Self-care: Meal times are:  Breakfast is at 9-10 am, Lunch-1 PM : Dinner: 8 pm   Typical meal intake: Breakfast is Usually toast and boiled eggs.  Usually getting cold cut sandwiches at lunch, grilled chicken and vegetables/potatoes at dinner.  Snacks are usually chips, crackers               Dietician visit, most recent: 05/02/16               Exercise:  some walking, limited by pain  Weight history: His highest weight in the past has been 378    Wt Readings from Last 3 Encounters:  07/31/16 249 lb 9.6 oz (113.2 kg)  06/22/16 248 lb 12.8 oz (112.9 kg)  05/29/16 252 lb (114.3 kg)    Glycemic control:   Lab Results  Component Value Date   HGBA1C 8.4 (H) 06/19/2016   HGBA1C 11.6 (H) 03/06/2016   HGBA1C 13.7 (H) 07/25/2015   Lab Results  Component Value Date   MICROALBUR 11.8 (H)  03/23/2016   LDLCALC 51 03/23/2016   CREATININE 1.13 07/27/2016   Lab Results  Component Value Date   MICRALBCREAT 7.2 03/23/2016       Allergies as of 07/31/2016   No Known Allergies     Medication List       Accurate as of 07/31/16  8:28 PM. Always use your most recent med list.          amLODipine-benazepril 10-40 MG capsule Commonly known as:  LOTREL Take 1 capsule by mouth daily.   chlorthalidone 25 MG tablet Commonly known as:  HYGROTON Take 1 tablet (25 mg total) by mouth daily.   cloNIDine 0.1 mg/24hr patch Commonly known as:  CATAPRES - Dosed in mg/24 hr Place 1 patch (0.1 mg total) onto the skin once a week.   empagliflozin 10 MG Tabs tablet Commonly known as:  JARDIANCE Take 10 mg by mouth daily with breakfast.   FREESTYLE LIBRE READER Devi 1 Device by Does not apply route as directed.   FREESTYLE LIBRE SENSOR SYSTEM Misc 1 strip  by Does not apply route as directed. Apply to upper arm and change sensor every 10 days   furosemide 20 MG tablet Commonly known as:  LASIX Take 1 tablet (20 mg total) by mouth daily.   glucose blood test strip Commonly known as:  ONETOUCH VERIO Use to check blood sugar 3 times per day.   hydrALAZINE 25 MG tablet Commonly known as:  APRESOLINE Take 1 tablet (25 mg total) by mouth every 8 (eight) hours.   insulin aspart 100 UNIT/ML injection Commonly known as:  NOVOLOG Inject 66 Units into the skin 3 (three) times daily before meals.   Insulin Syringe-Needle U-100 31G X 5/16" 0.3 ML Misc Use to give 6-8 units of Novolog with dinner.   metFORMIN 500 MG 24 hr tablet Commonly known as:  GLUCOPHAGE-XR Take 4 tablets (2,000 mg total) by mouth daily with supper.   oxyCODONE-acetaminophen 5-325 MG tablet Commonly known as:  PERCOCET/ROXICET take 1-2 tablets by mouth every 8 hours if needed for MODERATE OR...  (REFER TO PRESCRIPTION NOTES).   pregabalin 75 MG capsule Commonly known as:  LYRICA Take 1 capsule (75 mg total) by mouth 2 (two) times daily.   sildenafil 100 MG tablet Commonly known as:  VIAGRA Take 0.5-1 tablets (50-100 mg total) by mouth daily as needed for erectile dysfunction.   sildenafil 20 MG tablet Commonly known as:  REVATIO 2-3 tabs as directed   traMADol 50 MG tablet Commonly known as:  ULTRAM Take 50 mg by mouth every 6 (six) hours as needed.   V-GO 30 Kit Use 1 kit a day with insulin       Allergies: No Known Allergies  Past Medical History:  Diagnosis Date  . Asthma   . Diabetes mellitus    sees Dr. Dwyane Dee   . GERD (gastroesophageal reflux disease)   . Headache(784.0)   . Hypertension     Past Surgical History:  Procedure Laterality Date  . AMPUTATION Left 07/26/2015   Procedure: AMPUTATION LEFT FIFTH RAY;  Surgeon: [name], MD;  Location: [place];  Service: Orthopedics;  Laterality: Left;  . bilateral hip pins placed    . INCISION  AND DRAINAGE PERIRECTAL ABSCESS Right 03/07/2016   Procedure: IRRIGATION AND DEBRIDEMENT PERIRECTAL ABSCESS;  Surgeon: Alphonsa Overall, MD;  Location: [facility];  Service: General;  Laterality: Right;  . INCISION AND DRAINAGE PERIRECTAL ABSCESS Right 03/09/2016   Procedure: EXAM UNDER ANESTHESIA, IRRIGATION AND  DEBRIDEMENT PERIRECTAL ABSCESS;  Surgeon: Johnathan Hausen, MD;  Location: [facility];  Service: General;  Laterality: Right;  Open, betadine packed wound    Family History  Problem Relation Age of Onset  . Arthritis Unknown   . Diabetes Unknown   . Hypertension Unknown   . Hyperlipidemia Unknown   . Stroke Unknown   . Sudden death Unknown   . Diabetes Mother   . Diabetes Sister   . Diabetes Brother     Social History:  reports that he has quit smoking. His smoking use included Cigarettes. He has never used smokeless tobacco. He reports that he does not drink alcohol or use drugs.   Review of Systems   Lipid history: LDL has been low as also HDL without treatment    Lab Results  Component Value Date   CHOL 110 03/23/2016   HDL 28.70 (L) 03/23/2016   LDLCALC 51 03/23/2016   TRIG 152.0 (H) 03/23/2016   CHOLHDL 4 03/23/2016           Hypertension: On Rx For several years, followed by PCP, is taking Lotrel, clonidine patch and hydralazine  He has had poor control of his blood pressure For this reason chlorthalidone was added on his last visit but blood pressure is only slightly better He takes this at night because he thinks it makes him sleepy  Still taking some Lasix because of history of edema No history of hypokalemia in the past  BP Readings from Last 3 Encounters:  07/31/16 (!) 128/96  06/22/16 (!) 142/104  05/29/16 (!) 178/100   Lab Results  Component Value Date   NA 135 07/27/2016   K 4.4 07/27/2016   CL 102 07/27/2016   CO2 26 07/27/2016     Most recent foot exam: 8/17  He has sharp pains in his feet from neuropathy,  on Lyrica and he is still having  Significant pain and numbness, is Asking about taking multiple pain medications including tramadol   He was given Generic sildenafil but he still has difficulty getting this authorized through his insurance, needs 100 mg dosage   LABS:  Lab on 07/27/2016  Component Date Value Ref Range Status  . Sodium 07/27/2016 135  135 - 145 mEq/L Final  . Potassium 07/27/2016 4.4  3.5 - 5.1 mEq/L Final  . Chloride 07/27/2016 102  96 - 112 mEq/L Final  . CO2 07/27/2016 26  19 - 32 mEq/L Final  . Glucose, Bld 07/27/2016 154* 70 - 99 mg/dL Final  . BUN 07/27/2016 19  6 - 23 mg/dL Final  . Creatinine, Ser 07/27/2016 1.13  0.40 - 1.50 mg/dL Final  . Calcium 07/27/2016 9.7  8.4 - 10.5 mg/dL Final  . GFR 07/27/2016 92.89  >60.00 mL/min Final  . Fructosamine 07/27/2016 334* 0 - 285 umol/L Final   Comment: Published reference interval for apparently healthy subjects between age 65 and 61 is 24 - 285 umol/L and in a poorly controlled diabetic population is 228 - 563 umol/L with a mean of 396 umol/L.     Physical Examination:  BP (!) 128/96 (Cuff Size: Large)   Pulse 100   Ht _0  (1.753 m)   Wt 249 lb 9.6 oz (113.2 kg)   SpO2 97%   BMI 36.86 kg/m    Blood pressure high on repeat measurement No significant pedal edema  ASSESSMENT:  Diabetes type 2, uncontrolled With BMI 36 He has had long-standing diabetes with Previously persistently poor control  See history of present  illness for detailed discussion of current diabetes management, blood sugar patterns and problems identified  Even with trying to improve his diet and increasing his boluses his blood sugars are poorly controlled and appears to require much more insulin especially at meal times Fasting readings are more consistently high also with the 30 unit basal Not appearing to benefit much from metformin. He will benefit from adding an SGLT 2 inhibitors  Discussed action of SGLT 2 drugs on lowering glucose by decreasing kidney  absorption of glucose, benefits of better diabetes control, weight loss and lower blood pressure, possible side effects including volume depletion, rarely urinary/genital bacterial or Candida infection and dosage regimen    HYPERTENSION: Poor control still with multiple drugs including adding chlorthalidone Consider evaluation for secondary hypertension  PLAN:    He will start with 10 milligrams of Jardiance, apparently his Medicaid will pay for this only  This may improve his blood sugar control including postprandial  No change in basal rate as yet but may consider it on the next visit  Continue increasing boluses for larger meals  Low fat meals  May need to stop Lasix while starting Jardiance and then take it as needed  Continue follow-up of blood pressure and renal function on next visit  Recommended pain clinic for his neuropathy, he will discuss with PCP  Continue using freestyle Libre sensor and call if blood sugars not improved  Counseling time on subjects discussed above is over 50% of today's 25 minute visit   Patient Instructions  Add 6-8 units Novolog at supper if out of clicks  No Lasix unless swelling   Mieko Kneebone 07/31/2016, 8:28 PM   Note: This office note was prepared with Dragon voice recognition system technology. Any transcriptional errors that result from this process are unintentional.

## 2016-07-31 NOTE — Patient Instructions (Signed)
Add 6-8 units Novolog at supper if out of clicks  No Lasix unless swelling

## 2016-08-03 ENCOUNTER — Encounter: Payer: Self-pay | Admitting: Podiatry

## 2016-08-03 ENCOUNTER — Ambulatory Visit (INDEPENDENT_AMBULATORY_CARE_PROVIDER_SITE_OTHER): Payer: BC Managed Care – PPO | Admitting: Family Medicine

## 2016-08-03 ENCOUNTER — Encounter: Payer: Self-pay | Admitting: Family Medicine

## 2016-08-03 VITALS — BP 136/92 | Ht 69.0 in | Wt 253.0 lb

## 2016-08-03 DIAGNOSIS — I10 Essential (primary) hypertension: Secondary | ICD-10-CM

## 2016-08-03 DIAGNOSIS — M2550 Pain in unspecified joint: Secondary | ICD-10-CM

## 2016-08-03 DIAGNOSIS — E1165 Type 2 diabetes mellitus with hyperglycemia: Secondary | ICD-10-CM

## 2016-08-03 DIAGNOSIS — E1142 Type 2 diabetes mellitus with diabetic polyneuropathy: Secondary | ICD-10-CM | POA: Diagnosis not present

## 2016-08-03 MED ORDER — MELOXICAM 15 MG PO TABS: 15.0000 mg | ORAL_TABLET | Freq: Every day | ORAL | 3 refills | Status: DC

## 2016-08-03 MED ORDER — PREGABALIN 150 MG PO CAPS: 150.0000 mg | ORAL_CAPSULE | Freq: Two times a day (BID) | ORAL | 5 refills | Status: DC

## 2016-08-03 NOTE — Progress Notes (Signed)
   Subjective:    Patient ID: Danny Fuller, male    DOB: [birth year], 41 y.o.   MRN: [id number]  HPI Here to talk about worsening joint pains all over the body. His shoulders seem to be the most affected. He is taking Lyrica 75 mg bid and he takes a lot of Tramadol every day. We had referred him to a local Rheumatology office but he missed 2 appointments due to being in the hospital one time and his car broke down the other time. Unfortunately that office now refuses to schedule him. His diabetes is improving steadily and his recent A1c was down to 8.4. His HTN is stable for the most part.   Review of Systems  Constitutional: Negative.   Respiratory: Negative.   Cardiovascular: Negative.   Gastrointestinal: Negative.   Musculoskeletal: Positive for arthralgias and gait problem.       Objective:   Physical Exam  Constitutional: He is oriented to person, place, and time. He appears well-developed and well-nourished.  Neck: No thyromegaly present.  Cardiovascular: Normal rate, regular rhythm, normal heart sounds and intact distal pulses.   Pulmonary/Chest: Effort normal and breath sounds normal. No respiratory distress. He has no wheezes. He has no rales.  Musculoskeletal:  Both shoulders are tender and his ROM is limited by pain   Lymphadenopathy:    He has no cervical adenopathy.  Neurological: He is alert and oriented to person, place, and time.          Assessment & Plan:  He has arthralgias that seem to be getting worse, and it seems to have an inflammatory component. We will increase the Lyrica to 150 mg bid. Add Meloxicam 15 mg daily. Add tramadol prn. We will refer him to [facility]. His diabetes is improving and the HTN is stable.  [name], MD

## 2016-08-03 NOTE — Patient Instructions (Signed)
WE NOW OFFER   [facility] FAST [facility]!!!  SAME DAY Appointments for ACUTE CARE  Such as: Sprains, Injuries, cuts, abrasions, rashes, muscle pain, joint pain, back pain Colds, flu, sore throats, headache, allergies, cough, fever  Ear pain, sinus and eye infections Abdominal pain, nausea, vomiting, diarrhea, upset stomach Animal/insect bites  3 Easy Ways to Schedule: Walk-In Scheduling Call in scheduling Mychart Sign-up: [email or web address]         

## 2016-08-04 ENCOUNTER — Telehealth: Payer: Self-pay | Admitting: Endocrinology

## 2016-08-04 MED ORDER — INSULIN ASPART 100 UNIT/ML FLEXPEN: PEN_INJECTOR | SUBCUTANEOUS | 5 refills | Status: DC

## 2016-08-04 NOTE — Telephone Encounter (Signed)
Patient stated he need the Novalog injection pens Not the Insulin Syringe-Needle U-100 31G X 5/16" 0.3 ML MISC  a form was faxed to our for a PA  For viagra

## 2016-08-04 NOTE — Telephone Encounter (Signed)
Rx for novolog Pen submitted, Jinny Blossom see message about viagra PA.

## 2016-08-07 ENCOUNTER — Other Ambulatory Visit: Payer: Self-pay

## 2016-08-07 ENCOUNTER — Encounter: Payer: Self-pay | Admitting: Family Medicine

## 2016-08-07 ENCOUNTER — Encounter: Payer: Self-pay | Admitting: Endocrinology

## 2016-08-07 ENCOUNTER — Other Ambulatory Visit: Payer: Self-pay | Admitting: Endocrinology

## 2016-08-07 ENCOUNTER — Other Ambulatory Visit: Payer: Self-pay | Admitting: Family Medicine

## 2016-08-07 MED ORDER — GLUCOSE BLOOD VI STRP: ORAL_STRIP | 3 refills | Status: DC

## 2016-08-07 MED ORDER — INSULIN PEN NEEDLE 31G X 5 MM MISC: 3 refills | Status: DC

## 2016-08-07 NOTE — Telephone Encounter (Signed)
Can we refill this? Please see last office visit note.

## 2016-08-08 NOTE — Telephone Encounter (Signed)
Ok to refill for 30 days  

## 2016-08-10 ENCOUNTER — Ambulatory Visit (INDEPENDENT_AMBULATORY_CARE_PROVIDER_SITE_OTHER): Payer: BC Managed Care – PPO

## 2016-08-10 ENCOUNTER — Other Ambulatory Visit: Payer: Self-pay

## 2016-08-10 ENCOUNTER — Ambulatory Visit (INDEPENDENT_AMBULATORY_CARE_PROVIDER_SITE_OTHER): Payer: BC Managed Care – PPO | Admitting: Podiatry

## 2016-08-10 ENCOUNTER — Encounter: Payer: Self-pay | Admitting: Podiatry

## 2016-08-10 DIAGNOSIS — M79672 Pain in left foot: Secondary | ICD-10-CM

## 2016-08-10 DIAGNOSIS — G629 Polyneuropathy, unspecified: Secondary | ICD-10-CM

## 2016-08-10 DIAGNOSIS — S98139A Complete traumatic amputation of one unspecified lesser toe, initial encounter: Secondary | ICD-10-CM | POA: Diagnosis not present

## 2016-08-10 DIAGNOSIS — Q828 Other specified congenital malformations of skin: Secondary | ICD-10-CM | POA: Diagnosis not present

## 2016-08-10 MED ORDER — SILDENAFIL CITRATE 100 MG PO TABS: 100.0000 mg | ORAL_TABLET | Freq: Every day | ORAL | 1 refills | Status: DC | PRN

## 2016-08-10 MED ORDER — GLUCOSE BLOOD VI STRP: ORAL_STRIP | 12 refills | Status: DC

## 2016-08-10 MED ORDER — ACCU-CHEK SOFTCLIX LANCET DEV MISC: 2 refills | Status: DC

## 2016-08-10 NOTE — Telephone Encounter (Signed)
Spoke with the patient and he stated he has changed pharmacies to [facility] and he believes that the pharmacy has been running his prescriptions under his old insurance instead of hid Medicaid- patient no longer works and only has medicaid so I am going to call the pharmacy to make sure they are running it correctly

## 2016-08-10 NOTE — Progress Notes (Signed)
Subjective:    Patient ID: Danny Fuller, male   DOB: 41 y.o.   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 and also lesion plantar right that upon debridement shows a lucent-type core     Assessment:   Continued problems with ambulation secondary to loss of fifth metatarsal toe with orthotics which need to be adjusted along with keratotic lesion porokeratotic in nature plantar right      Plan:    H&P x-rays of left reviewed and today debrided lesion on the right and patient will see head orthotist for evaluation of the type of orthotic use using  X-rays indicate that there is no indications of stress fracture or arthritis but there is a well-healed surgical site where the fifth metatarsals been removed along with the fifth digit

## 2016-08-15 ENCOUNTER — Ambulatory Visit: Payer: BC Managed Care – PPO | Admitting: Orthotics

## 2016-08-22 ENCOUNTER — Encounter: Payer: Self-pay | Admitting: Family Medicine

## 2016-08-23 NOTE — Telephone Encounter (Signed)
Call in Tramadol #[phone] with 5 rf 

## 2016-08-25 ENCOUNTER — Other Ambulatory Visit: Payer: Self-pay | Admitting: Family Medicine

## 2016-08-25 MED ORDER — TRAMADOL HCL 50 MG PO TABS: ORAL_TABLET | ORAL | 5 refills | Status: DC

## 2016-08-29 ENCOUNTER — Other Ambulatory Visit: Payer: BC Managed Care – PPO

## 2016-08-29 ENCOUNTER — Ambulatory Visit (INDEPENDENT_AMBULATORY_CARE_PROVIDER_SITE_OTHER): Payer: Medicaid Other | Admitting: Orthotics

## 2016-08-29 DIAGNOSIS — S98139A Complete traumatic amputation of one unspecified lesser toe, initial encounter: Secondary | ICD-10-CM

## 2016-08-29 NOTE — Progress Notes (Signed)
Patient came in today to complain that foot was still "sliding" off the insert (5th Ray amputation left)...i sent back to [facility] to add toe filler and 4* valgus wedge.

## 2016-08-31 ENCOUNTER — Other Ambulatory Visit: Payer: Self-pay | Admitting: Endocrinology

## 2016-09-01 ENCOUNTER — Encounter: Payer: Self-pay | Admitting: Family Medicine

## 2016-09-01 ENCOUNTER — Other Ambulatory Visit (INDEPENDENT_AMBULATORY_CARE_PROVIDER_SITE_OTHER): Payer: BC Managed Care – PPO

## 2016-09-01 DIAGNOSIS — E1165 Type 2 diabetes mellitus with hyperglycemia: Secondary | ICD-10-CM

## 2016-09-01 DIAGNOSIS — Z794 Long term (current) use of insulin: Secondary | ICD-10-CM | POA: Diagnosis not present

## 2016-09-01 LAB — BASIC METABOLIC PANEL
BUN: 15 mg/dL (ref 6–23)
CHLORIDE: 106 meq/L (ref 96–112)
CO2: 25 meq/L (ref 19–32)
CREATININE: 1.19 mg/dL (ref 0.40–1.50)
Calcium: 9 mg/dL (ref 8.4–10.5)
GFR: 87.46 mL/min (ref >60.00)
Glucose, Bld: 219 mg/dL — ABNORMAL HIGH (ref 70–99)
Potassium: 4.5 mEq/L (ref 3.5–5.1)
Sodium: 137 mEq/L (ref 135–145)

## 2016-09-01 NOTE — Telephone Encounter (Signed)
I don't see a referral in for neurology.

## 2016-09-01 NOTE — Telephone Encounter (Signed)
klonidine patch needs refills called into [facility]  Let the pt know that it was called in yesterday to [facility] and he said that was fine he will get it there so the klonidine does not need to be called in again

## 2016-09-02 LAB — FRUCTOSAMINE: FRUCTOSAMINE: 305 umol/L — AB (ref 0–285)

## 2016-09-03 NOTE — Progress Notes (Signed)
Patient ID: Danny Fuller, male   DOB: 09-29-1975, 41 y.o.   MRN: [id number]            Reason for Appointment: Follow-up for Type 2 Diabetes  Referring physician: Sarajane Jews   History of Present Illness:          Date of diagnosis of type 2 diabetes mellitus: 2007        Background history:   At diagnosis he was relatively asymptomatic and was started on metformin and glipizide He has been on the same oral hypoglycemic regimen for several years. Review of his A1c indicates it has been markedly increased persistently with the lowest reading in 2015 being 9.1  Recent history:   INSULIN regimen is:  V-go pump with basal 30 units and boluses 6 units at meals       Non-insulin hypoglycemic drugs the patient is taking are: Metformin ER 2000 mg daily Because of poor control he was started on the V-go pump on 05/03/16.  His A1c is most recently 8.4%, previously 11.6 His fructosamine is improved at 305 now  Current management, blood sugar patterns and problems identified:  He was told to start adding NovoLog using a pen at least 6 units at meal times in addition to his boluses with the pump to improve his postprandial hyperglycemia on his last visit  Also started on Jardiance in addition to his metformin  Blood sugars are overall better, previously averaging at least 230 after lunch and dinner  Also his FASTING readings are better, averaging about 150 compared to 171 before and much more consistent  He has tried to be consistent with diet and is trying to be more active also overall  However has gained 10 pounds  This is despite starting JARDIANCE 10 mg daily  HIGHEST blood sugars are overall late at night after supper and periodically over 200 at bedtime; however lab glucose was 219 midday        Side effects from medications have been: None  Compliance with the medical regimen: Improving Hypoglycemia:   none  Glucose monitoring:  freestyle Libre sensor  Blood Glucose  readings by time of day from download analysis:  Mean values apply above for all meters except median for One Touch  PRE-MEAL Fasting Lunch Dinner Bedtime Overall  Glucose range:    195    Mean/median: 149  176  177   176    POST-MEAL PC Breakfast PC Lunch PC Dinner  Glucose range:     Mean/median:  199       Self-care: Meal times are:  Breakfast is at 9-10 am, Lunch-1 PM : Dinner: 8 pm   Typical meal intake: Breakfast is Usually toast and boiled eggs.  Usually getting cold cut sandwiches at lunch, grilled chicken and vegetables/potatoes at dinner.  Snacks are usually chips, crackers                Dietician visit, most recent: 05/02/16               Exercise:  some walking, limited by pain  Weight history: His highest weight in the past has been 378    Wt Readings from Last 3 Encounters:  09/04/16 259 lb 9.6 oz (117.8 kg)  08/03/16 253 lb (114.8 kg)  07/31/16 249 lb 9.6 oz (113.2 kg)    Glycemic control:   Lab Results  Component Value Date   HGBA1C 8.4 (H) 06/19/2016   HGBA1C 11.6 (H) 03/06/2016   HGBA1C 13.7 (  H) 07/25/2015   Lab Results  Component Value Date   MICROALBUR 11.8 (H) 03/23/2016   LDLCALC 51 03/23/2016   CREATININE 1.19 09/01/2016   Lab Results  Component Value Date   MICRALBCREAT 7.2 03/23/2016    Lab Results  Component Value Date   FRUCTOSAMINE 305 (H) 09/01/2016   FRUCTOSAMINE 334 (H) 07/27/2016   FRUCTOSAMINE 406 (H) 03/23/2016      Allergies as of 09/04/2016   No Known Allergies     Medication List       Accurate as of 09/04/16 12:18 PM. Always use your most recent med list.          accu-chek softclix lancets Use as instructed   amLODipine-benazepril 10-40 MG capsule Commonly known as:  LOTREL Take 1 capsule by mouth daily.   chlorthalidone 25 MG tablet Commonly known as:  HYGROTON Take 1 tablet (25 mg total) by mouth daily.   cloNIDine 0.1 mg/24hr patch Commonly known as:  CATAPRES - Dosed in mg/24 hr apply 1 patch  every week   empagliflozin 25 MG Tabs tablet Commonly known as:  JARDIANCE Take 25 mg by mouth daily.   FREESTYLE LIBRE READER Devi 1 Device by Does not apply route as directed.   FREESTYLE LIBRE SENSOR SYSTEM Misc APPLY TO UPPER ARM AND CHANGE SENSOR EVERY 10 DAYS   furosemide 20 MG tablet Commonly known as:  LASIX Take 1 tablet (20 mg total) by mouth daily.   glucose blood test strip Commonly known as:  ONETOUCH VERIO Use to check blood sugar 3 times per day.   glucose blood test strip Commonly known as:  FREESTYLE PRECISION NEO TEST Use to test blood sugar 1 times daily   glucose blood test strip Commonly known as:  ACCU-CHEK AVIVA Use to test blood sugar 1 time daily   hydrALAZINE 25 MG tablet Commonly known as:  APRESOLINE Take 1 tablet (25 mg total) by mouth every 8 (eight) hours.   insulin aspart 100 UNIT/ML FlexPen Commonly known as:  NOVOLOG Inject 66 Units into the skin 3 (three) times daily before meals.   Insulin Pen Needle 31G X 5 MM Misc Commonly known as:  B-D UF III MINI PEN NEEDLES Use to inject insulin once daily   meloxicam 15 MG tablet Commonly known as:  MOBIC Take 1 tablet (15 mg total) by mouth daily.   metFORMIN 500 MG 24 hr tablet Commonly known as:  GLUCOPHAGE-XR Take 4 tablets (2,000 mg total) by mouth daily with supper.   pregabalin 150 MG capsule Commonly known as:  LYRICA Take 1 capsule (150 mg total) by mouth 2 (two) times daily.   sildenafil 100 MG tablet Commonly known as:  VIAGRA Take 1 tablet (100 mg total) by mouth daily as needed for erectile dysfunction.   traMADol 50 MG tablet Commonly known as:  ULTRAM take 2 tablets by mouth every 6 hours if needed for pain   V-GO 30 Kit Use 1 kit a day with insulin       Allergies: No Known Allergies  Past Medical History:  Diagnosis Date  . Asthma   . Diabetes mellitus    sees Dr. Dwyane Dee   . GERD (gastroesophageal reflux disease)   . Headache(784.0)   . Hypertension       Past Surgical History:  Procedure Laterality Date  . AMPUTATION Left 07/26/2015   Procedure: AMPUTATION LEFT FIFTH RAY;  Surgeon: [name], MD;  Location: [facility];  Service: Orthopedics;  Laterality: Left;  .  bilateral hip pins placed    . INCISION AND DRAINAGE PERIRECTAL ABSCESS Right 03/07/2016   Procedure: IRRIGATION AND DEBRIDEMENT PERIRECTAL ABSCESS;  Surgeon: Alphonsa Overall, MD;  Location: [facility];  Service: General;  Laterality: Right;  . INCISION AND DRAINAGE PERIRECTAL ABSCESS Right 03/09/2016   Procedure: EXAM UNDER ANESTHESIA, IRRIGATION AND DEBRIDEMENT PERIRECTAL ABSCESS;  Surgeon: Johnathan Hausen, MD;  Location: [facility];  Service: General;  Laterality: Right;  Open, betadine packed wound    Family History  Problem Relation Age of Onset  . Arthritis Unknown   . Diabetes Unknown   . Hypertension Unknown   . Hyperlipidemia Unknown   . Stroke Unknown   . Sudden death Unknown   . Diabetes Mother   . Diabetes Sister   . Diabetes Brother     Social History:  reports that he has quit smoking. His smoking use included Cigarettes. He has never used smokeless tobacco. He reports that he does not drink alcohol or use drugs.   Review of Systems   Lipid history: LDL has been low as also HDL without treatment    Lab Results  Component Value Date   CHOL 110 03/23/2016   HDL 28.70 (L) 03/23/2016   LDLCALC 51 03/23/2016   TRIG 152.0 (H) 03/23/2016   CHOLHDL 4 03/23/2016           Hypertension: On Rx For several years, followed by PCP, is taking Lotrel, clonidine patch, Chlorthalidone and hydralazine  He has Somewhat blood pressure readings now with adding Jardiance to his regimen   BP Readings from Last 3 Encounters:  09/04/16 140/88  08/03/16 (!) 136/92  07/31/16 (!) 128/96    EDEMA: He thinks his legs get swollen and sometimes especially at the left 1 around midday May have been better with taking Lasix which was stopped with starting Jardiance No change in renal  function with starting this  Lab Results  Component Value Date   CREATININE 1.19 09/01/2016   BUN 15 09/01/2016   NA 137 09/01/2016   K 4.5 09/01/2016   CL 106 09/01/2016   CO2 25 09/01/2016      Most recent foot exam: 8/17  He has sharp pains in his feet from neuropathy,  on Lyrica and he is still having Significant pain and numbness   He was given Generic sildenafil but Still cannot afford this   LABS:  Lab on 09/01/2016  Component Date Value Ref Range Status  . Sodium 09/01/2016 137  135 - 145 mEq/L Final  . Potassium 09/01/2016 4.5  3.5 - 5.1 mEq/L Final  . Chloride 09/01/2016 106  96 - 112 mEq/L Final  . CO2 09/01/2016 25  19 - 32 mEq/L Final  . Glucose, Bld 09/01/2016 219* 70 - 99 mg/dL Final  . BUN 09/01/2016 15  6 - 23 mg/dL Final  . Creatinine, Ser 09/01/2016 1.19  0.40 - 1.50 mg/dL Final  . Calcium 09/01/2016 9.0  8.4 - 10.5 mg/dL Final  . GFR 09/01/2016 87.46  >60.00 mL/min Final  . Fructosamine 09/01/2016 305* 0 - 285 umol/L Final   Comment: Published reference interval for apparently healthy subjects between age 42 and 93 is 47 - 285 umol/L and in a poorly controlled diabetic population is 228 - 563 umol/L with a mean of 396 umol/L.     Physical Examination:  BP 140/88   Pulse 92   Ht 5' 9"  (1.753 m)   Wt 259 lb 9.6 oz (117.8 kg)   SpO2 98%  BMI 38.34 kg/m   No significant pedal edema, Has minimal lower leg edema  ASSESSMENT:  Diabetes type 2, uncontrolled With BMI 36 He has had long-standing diabetes with Previously persistently poor control  See history of present illness for detailed discussion of current diabetes management, blood sugar patterns and problems identified   His blood sugars are somewhat better with increasing insulin and starting Jardiance as above He is trying to be more active Diet is somewhat better Highest blood sugars are after lunch and supper despite taking his boluses with the pump and additional 6 units of  NovoLog with the pen    HYPERTENSION: control is improving   PLAN:    He will  increase the insulin at mealtimes to 10 units with the pen in addition to his boluses   Jardiance 25 mg daily, he can finish the current prescription with 2 tablets daily  May take Lasix as needed if he is having edema  Recheck A1c on the next visit    Patient Instructions  Take at least 8-10 units with pen at lunch   Jardiance 2 daily  Lasix as nedded     [facility] 09/04/2016, 12:18 PM   Note: This office note was prepared with Estate agent. Any transcriptional errors that result from this process are unintentional.

## 2016-09-04 ENCOUNTER — Other Ambulatory Visit: Payer: Self-pay

## 2016-09-04 ENCOUNTER — Encounter: Payer: Self-pay | Admitting: Endocrinology

## 2016-09-04 ENCOUNTER — Ambulatory Visit (INDEPENDENT_AMBULATORY_CARE_PROVIDER_SITE_OTHER): Payer: Medicaid Other | Admitting: Endocrinology

## 2016-09-04 VITALS — BP 140/88 | HR 92 | Ht 69.0 in | Wt 259.6 lb

## 2016-09-04 DIAGNOSIS — E1165 Type 2 diabetes mellitus with hyperglycemia: Secondary | ICD-10-CM

## 2016-09-04 DIAGNOSIS — Z794 Long term (current) use of insulin: Secondary | ICD-10-CM | POA: Diagnosis not present

## 2016-09-04 MED ORDER — EMPAGLIFLOZIN 25 MG PO TABS: 25.0000 mg | ORAL_TABLET | Freq: Every day | ORAL | 3 refills | Status: DC

## 2016-09-04 MED ORDER — FREESTYLE LIBRE SENSOR SYSTEM MISC: 3 refills | Status: DC

## 2016-09-04 NOTE — Telephone Encounter (Signed)
Can you give pt the name of neurologist, can just send a my chart message, please to pt?

## 2016-09-04 NOTE — Addendum Note (Signed)
Addended by: [name] A on: 09/04/2016 01:06 PM   Modules accepted: Orders

## 2016-09-04 NOTE — Patient Instructions (Signed)
Take at least 8-10 units with pen at lunch   Jardiance 2 daily  Lasix as nedded

## 2016-09-04 NOTE — Telephone Encounter (Signed)
The referral to Neurology was done

## 2016-09-05 ENCOUNTER — Encounter: Payer: Self-pay | Admitting: Neurology

## 2016-09-11 ENCOUNTER — Encounter: Payer: BC Managed Care – PPO | Admitting: Orthotics

## 2016-09-13 ENCOUNTER — Encounter: Payer: Self-pay | Admitting: Endocrinology

## 2016-09-13 ENCOUNTER — Telehealth: Payer: Self-pay | Admitting: Endocrinology

## 2016-09-13 MED ORDER — FREESTYLE LIBRE SENSOR SYSTEM MISC: 3 refills | Status: DC

## 2016-09-13 MED ORDER — INSULIN ASPART 100 UNIT/ML FLEXPEN: PEN_INJECTOR | SUBCUTANEOUS | 5 refills | Status: DC

## 2016-09-13 MED ORDER — CLONIDINE HCL 0.1 MG/24HR TD PTWK: MEDICATED_PATCH | TRANSDERMAL | 2 refills | Status: DC

## 2016-09-13 NOTE — Telephone Encounter (Signed)
**  Remind patient they can make refill requests via MyChart**  Medication refill request (Name & Dosage): Continuous Blood Gluc Sensor (FREESTYLE LIBRE SENSOR SYSTEM) MISC cloNIDine (CATAPRES - DOSED IN MG/24 HR) 0.1 mg/24hr patch insulin aspart (NOVOLOG) 100 UNIT/ML FlexPen  Preferred pharmacy (Name & Address): [facility] - [place], [place] [phone] (Phone) [phone] (Fax)       Other comments (if applicable):

## 2016-09-13 NOTE — Telephone Encounter (Signed)
Refill submitted. 

## 2016-09-14 ENCOUNTER — Other Ambulatory Visit: Payer: Self-pay

## 2016-09-14 MED ORDER — INSULIN ASPART 100 UNIT/ML FLEXPEN: PEN_INJECTOR | SUBCUTANEOUS | 5 refills | Status: DC

## 2016-09-14 MED ORDER — NOVOLOG 100 UNIT/ML ~~LOC~~ SOLN: SUBCUTANEOUS | 4 refills | Status: DC

## 2016-09-19 ENCOUNTER — Ambulatory Visit: Payer: BC Managed Care – PPO | Admitting: Orthotics

## 2016-09-19 ENCOUNTER — Encounter: Payer: Self-pay | Admitting: Endocrinology

## 2016-09-21 ENCOUNTER — Other Ambulatory Visit: Payer: Self-pay

## 2016-09-21 MED ORDER — GLUCOSE BLOOD VI STRP: ORAL_STRIP | 12 refills | Status: DC

## 2016-09-22 ENCOUNTER — Other Ambulatory Visit: Payer: Self-pay

## 2016-09-22 MED ORDER — GLUCOSE BLOOD VI STRP: ORAL_STRIP | 12 refills | Status: DC

## 2016-09-23 ENCOUNTER — Encounter: Payer: Self-pay | Admitting: Endocrinology

## 2016-09-25 ENCOUNTER — Other Ambulatory Visit: Payer: Self-pay

## 2016-09-25 MED ORDER — V-GO 30 KIT: PACK | 4 refills | Status: DC

## 2016-10-03 ENCOUNTER — Encounter: Payer: Self-pay | Admitting: Endocrinology

## 2016-10-05 ENCOUNTER — Other Ambulatory Visit: Payer: Self-pay

## 2016-10-05 ENCOUNTER — Encounter: Payer: Self-pay | Admitting: Endocrinology

## 2016-10-05 MED ORDER — CLONIDINE HCL 0.1 MG/24HR TD PTWK: MEDICATED_PATCH | TRANSDERMAL | 2 refills | Status: DC

## 2016-10-08 ENCOUNTER — Other Ambulatory Visit: Payer: Self-pay | Admitting: Family Medicine

## 2016-10-21 ENCOUNTER — Encounter: Payer: Self-pay | Admitting: Podiatry

## 2016-10-22 ENCOUNTER — Encounter: Payer: Self-pay | Admitting: Family Medicine

## 2016-10-23 ENCOUNTER — Encounter: Payer: Self-pay | Admitting: Podiatry

## 2016-10-23 ENCOUNTER — Encounter: Payer: Self-pay | Admitting: Endocrinology

## 2016-10-24 NOTE — Telephone Encounter (Signed)
Call in Lyrica 150 mg bid, #60 with 5 rf

## 2016-10-25 ENCOUNTER — Telehealth: Payer: Self-pay | Admitting: Podiatry

## 2016-10-25 ENCOUNTER — Telehealth: Payer: Self-pay

## 2016-10-25 ENCOUNTER — Other Ambulatory Visit: Payer: Self-pay | Admitting: Family Medicine

## 2016-10-25 ENCOUNTER — Telehealth: Payer: Self-pay | Admitting: Family Medicine

## 2016-10-25 MED ORDER — PREGABALIN 150 MG PO CAPS: 150.0000 mg | ORAL_CAPSULE | Freq: Two times a day (BID) | ORAL | 5 refills | Status: DC

## 2016-10-25 NOTE — Telephone Encounter (Signed)
Received PA request for Lyrica. Called & did PA over the phone with Medicaid. PA approved until 9.7.19, form faxed back to pharmacy.

## 2016-10-25 NOTE — Telephone Encounter (Signed)
Left vm for pt to call to schedule appt

## 2016-10-25 NOTE — Telephone Encounter (Signed)
I have called patient and let him know that Vaughan Basta is waiting on the paperwork to be faxed to have [name] fill out to resend again. I have a box of VGo 30 ready for him to pick up at the front desk until this is resolved.

## 2016-10-25 NOTE — Telephone Encounter (Signed)
Patient wants to know if the PA has been sent for the V-Go and he has [picked a DME so need to know if this has been resolved patient has not had any insulin in a week because he ran out of pods- please call the patient and let him know where we are with this

## 2016-10-26 ENCOUNTER — Other Ambulatory Visit: Payer: BC Managed Care – PPO

## 2016-10-26 ENCOUNTER — Ambulatory Visit (INDEPENDENT_AMBULATORY_CARE_PROVIDER_SITE_OTHER): Payer: Medicaid Other

## 2016-10-26 ENCOUNTER — Ambulatory Visit (INDEPENDENT_AMBULATORY_CARE_PROVIDER_SITE_OTHER): Payer: Medicaid Other | Admitting: Podiatry

## 2016-10-26 ENCOUNTER — Other Ambulatory Visit: Payer: Self-pay | Admitting: Podiatry

## 2016-10-26 ENCOUNTER — Encounter: Payer: Self-pay | Admitting: Podiatry

## 2016-10-26 ENCOUNTER — Telehealth: Payer: Self-pay

## 2016-10-26 DIAGNOSIS — M79674 Pain in right toe(s): Secondary | ICD-10-CM

## 2016-10-26 DIAGNOSIS — L97511 Non-pressure chronic ulcer of other part of right foot limited to breakdown of skin: Secondary | ICD-10-CM

## 2016-10-26 DIAGNOSIS — S98132A Complete traumatic amputation of one left lesser toe, initial encounter: Secondary | ICD-10-CM | POA: Diagnosis not present

## 2016-10-26 NOTE — Telephone Encounter (Signed)
I contacted [employer] and was able to obtain the form for the v-go pump PA. PA placed on MD's desk to review and sign.

## 2016-10-26 NOTE — Telephone Encounter (Signed)
PA submitted to [employer] via fax on 10/26/2016 at 435 pm. Waiting on response.

## 2016-10-26 NOTE — Progress Notes (Signed)
Subjective:    Patient ID: Danny Fuller, male   DOB: 41 y.o.   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 There is slight breakdown of tissue but it is localized with no odor     Assessment:   Localized ulceration measuring 5 x 5 mm with 2 mm of depth of the plantar right first metatarsal with no indication of infection      Plan:   Precautionary x-ray reviewed debrided tissue and flushed the area and applied a small amount of Iodosorb with padding instructed on padding usage reduced activity daily inspections of any redness drainage odor or other issues should occur he is to let us know immediately and go to the emergency room and he does understand the possibility that this may ultimately require amputation given the history of his left foot. Patient be seen back to recheck

## 2016-10-30 ENCOUNTER — Ambulatory Visit: Payer: BC Managed Care – PPO | Admitting: Endocrinology

## 2016-10-31 ENCOUNTER — Encounter: Payer: Self-pay | Admitting: Endocrinology

## 2016-11-02 ENCOUNTER — Encounter: Payer: Self-pay | Admitting: Family Medicine

## 2016-11-15 ENCOUNTER — Other Ambulatory Visit: Payer: Self-pay | Admitting: Family Medicine

## 2016-11-15 ENCOUNTER — Encounter: Payer: Self-pay | Admitting: Endocrinology

## 2016-11-24 ENCOUNTER — Other Ambulatory Visit (INDEPENDENT_AMBULATORY_CARE_PROVIDER_SITE_OTHER): Payer: Medicaid Other

## 2016-11-24 DIAGNOSIS — Z794 Long term (current) use of insulin: Secondary | ICD-10-CM | POA: Diagnosis not present

## 2016-11-24 DIAGNOSIS — E1165 Type 2 diabetes mellitus with hyperglycemia: Secondary | ICD-10-CM

## 2016-11-24 LAB — BASIC METABOLIC PANEL
BUN: 14 mg/dL (ref 6–23)
CALCIUM: 9.4 mg/dL (ref 8.4–10.5)
CO2: 28 meq/L (ref 19–32)
CREATININE: 1.15 mg/dL (ref 0.40–1.50)
Chloride: 101 mEq/L (ref 96–112)
GFR: 90.87 mL/min (ref >60.00)
Glucose, Bld: 318 mg/dL — ABNORMAL HIGH (ref 70–99)
Potassium: 4.7 mEq/L (ref 3.5–5.1)
SODIUM: 136 meq/L (ref 135–145)

## 2016-11-24 LAB — HEMOGLOBIN A1C: HEMOGLOBIN A1C: 10.8 % — AB (ref 4.6–6.5)

## 2016-11-26 NOTE — Progress Notes (Signed)
Cardiology Office Note   Date:  11/28/2016   ID:  Danny Fuller, DOB 08-14-1975, MRN [id number]  PCP:  [name], MD  Cardiologist:   Minus Breeding, MD  Referring:    No chief complaint on file.     History of Present Illness: Danny Fuller is a 41 y.o. male who is referred by Dr. Lorrene Fuller for evaluation of palpitations.  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 He's never had any prior cardiac workup. He is referred for evaluation of palpitations. This has been happening for about 8-9 months. He notices a daily. 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 His heart rate below. Takes about 30 minutes with come down. He might get a little lightheaded. He sometimes had to go to his knee but again has not lost consciousness. He'll feel weak. He might have some resting palpitations as well. These might last 3-4 minutes. He's not describing chest pressure although there is slight discomfort under the left breast when this happens. He's not having any jaw or arm pain. He has no new shortness of breath, PND or apnea. He's had poorly controlled diabetes. His blood pressure has been difficult to control.     Past Medical History:  Diagnosis Date  . Asthma   . Diabetes mellitus    sees Dr. Dwyane Dee   . GERD (gastroesophageal reflux disease)   . Headache(784.0)   . Hypertension     Past Surgical History:  Procedure Laterality Date  . AMPUTATION Left 07/26/2015   Procedure: AMPUTATION LEFT FIFTH RAY;  Surgeon: [name], MD;  Location: [facility];  Service: Orthopedics;  Laterality: Left;  . bilateral hip pins placed    . INCISION AND DRAINAGE PERIRECTAL ABSCESS Right 03/07/2016   Procedure: IRRIGATION AND DEBRIDEMENT PERIRECTAL ABSCESS;  Surgeon: Alphonsa Overall, MD;  Location: [facility];  Service: General;  Laterality: Right;  . INCISION AND DRAINAGE  PERIRECTAL ABSCESS Right 03/09/2016   Procedure: EXAM UNDER ANESTHESIA, IRRIGATION AND DEBRIDEMENT PERIRECTAL ABSCESS;  Surgeon: Johnathan Hausen, MD;  Location: [facility];  Service: General;  Laterality: Right;  Open, betadine packed wound     Current Outpatient Prescriptions  Medication Sig Dispense Refill  . amLODipine-benazepril (LOTREL) 10-40 MG capsule Take 1 capsule by mouth daily. 90 capsule 3  . cloNIDine (CATAPRES - DOSED IN MG/24 HR) 0.1 mg/24hr patch apply 1 patch every week 4 patch 2  . empagliflozin (JARDIANCE) 25 MG TABS tablet Take 25 mg by mouth daily. 30 tablet 3  . glucose blood (ACCU-CHEK AVIVA) test strip Use to test blood sugar 1 time daily 100 each 12  . hydrALAZINE (APRESOLINE) 25 MG tablet Take 1 tablet (25 mg total) by mouth every 8 (eight) hours. 90 tablet 11  . insulin aspart (NOVOLOG) 100 UNIT/ML FlexPen Inject 66 Units into the skin 3 (three) times daily before meals. 60 mL 5  . Insulin Disposable Pump (V-GO 30) KIT Use 1 kit a day with insulin 30 kit 4  . Lancet Devices (ACCU-CHEK SOFTCLIX) lancets Use as instructed 1 each 2  . meloxicam (MOBIC) 15 MG tablet Take 1 tablet (15 mg total) by mouth daily. 90 tablet 3  . metFORMIN (GLUCOPHAGE-XR) 500 MG 24 hr tablet Take 4 tablets (2,000 mg total) by mouth daily with supper. 120 tablet 3  . NOVOLOG 100 UNIT/ML injection  INJECT 66 UNITS INTO THE SKIN 3 TIMES DAILY BEFORE MEALS 10 mL 4  . pravastatin (PRAVACHOL) 40 MG tablet Take 1 tablet (40 mg total) by mouth every evening. 90 tablet 3  . pregabalin (LYRICA) 150 MG capsule Take 1 capsule (150 mg total) by mouth 2 (two) times daily. 60 capsule 5  . traMADol (ULTRAM) 50 MG tablet take 2 tablets by mouth every 6 hours if needed for pain 120 tablet 5   No current facility-administered medications for this visit.     Allergies:   Patient has no known allergies.    Social History:  The patient  reports that he quit smoking about 16 months ago. His smoking use included  Cigarettes. He has a 10.00 pack-year smoking history. He has never used smokeless tobacco. He reports that he does not drink alcohol or use drugs.   Family History:  The patient's family history includes Arthritis in his unknown relative; Diabetes in his brother, mother, sister, and unknown relative; Heart attack in his father; Hyperlipidemia in his unknown relative; Hypertension in his unknown relative; Stroke in his unknown relative; Sudden death in his unknown relative.    ROS:  Please see the history of present illness.   Otherwise, review of systems are positive for none.   All other systems are reviewed and negative.    PHYSICAL EXAM: VS:  BP (!) 130/92 (BP Location: Left Arm, Patient Position: Sitting, Cuff Size: Normal)   Pulse 87   Ht 5' 10"  (1.778 m)   Wt 244 lb 6.4 oz (110.9 kg)   BMI 35.07 kg/m  , BMI Body mass index is 35.07 kg/m. GENERAL:  Well appearing HEENT:  Pupils equal round and reactive, fundi not visualized, oral mucosa unremarkable NECK:  No jugular venous distention, waveform within normal limits, carotid upstroke brisk and symmetric, no bruits, no thyromegaly LYMPHATICS:  No cervical, inguinal adenopathy LUNGS:  Clear to auscultation bilaterally BACK:  No CVA tenderness CHEST:  Unremarkable HEART:  PMI not displaced or sustained,S1 and S2 within normal limits, no S3, no S4, no clicks, no rubs, no murmurs ABD:  Flat, positive bowel sounds normal in frequency in pitch, no bruits, no rebound, no guarding, no midline pulsatile mass, no hepatomegaly, no splenomegaly EXT:  2 plus pulses throughout, no edema, no cyanosis no clubbing SKIN:  No rashes no nodules NEURO:  Cranial nerves II through XII grossly intact, motor grossly intact throughout PSYCH:  Cognitively intact, oriented to person place and time    EKG:  EKG is ordered today. The ekg ordered today demonstrates sinus rhythm, rate 87, nonspecific inferior T-wave changes,   right axis deviation  Recent  Labs: 03/12/2016: Magnesium 1.7 03/15/2016: Hemoglobin 8.2; Platelets 691 06/19/2016: ALT 12 11/24/2016: BUN 14; Creatinine, Ser 1.15; Potassium 4.7; Sodium 136    Lipid Panel    Component Value Date/Time   CHOL 110 03/23/2016 0947   TRIG 152.0 (H) 03/23/2016 0947   HDL 28.70 (L) 03/23/2016 0947   CHOLHDL 4 03/23/2016 0947   VLDL 30.4 03/23/2016 0947   LDLCALC 51 03/23/2016 0947      Wt Readings from Last 3 Encounters:  11/27/16 244 lb 6.4 oz (110.9 kg)  09/04/16 259 lb 9.6 oz (117.8 kg)  08/03/16 253 lb (114.8 kg)      Other studies Reviewed: Additional studies/ records that were reviewed today include: Labs. Review of the above records demonstrates:  Please see elsewhere in the note.     ASSESSMENT AND PLAN:  PALPITATIONS:  I'm going to start with a 48-hour Holter.  Further evaluation and treatment will be based on this.   PVD:  Long discussion about lifestyle to include diet and increased activity. I also will suggest pravastatin given his high cardiovascular risk and already evidence of vascular disease and is poorly controlled diabetes.   Current medicines are reviewed at length with the patient today.  The patient does not have concerns regarding medicines.  The following changes have been made:  As above.   Labs/ tests ordered today include:   Orders Placed This Encounter  Procedures  . HOLTER MONITOR - 48 HOUR  . EKG 12-Lead     Disposition:   FU with me in one month.     Signed, Minus Breeding, MD  11/28/2016 12:52 PM     [facility]

## 2016-11-27 ENCOUNTER — Ambulatory Visit (INDEPENDENT_AMBULATORY_CARE_PROVIDER_SITE_OTHER): Payer: Medicaid Other | Admitting: Cardiology

## 2016-11-27 ENCOUNTER — Encounter: Payer: Self-pay | Admitting: Cardiology

## 2016-11-27 VITALS — BP 130/92 | HR 87 | Ht 70.0 in | Wt 244.4 lb

## 2016-11-27 DIAGNOSIS — R002 Palpitations: Secondary | ICD-10-CM | POA: Diagnosis not present

## 2016-11-27 DIAGNOSIS — I739 Peripheral vascular disease, unspecified: Secondary | ICD-10-CM

## 2016-11-27 MED ORDER — PRAVASTATIN SODIUM 40 MG PO TABS: 40.0000 mg | ORAL_TABLET | Freq: Every evening | ORAL | 3 refills | Status: DC

## 2016-11-27 NOTE — Patient Instructions (Signed)
Medication Instructions:  START- Pravastatin 40 mg daily   If you need a refill on your cardiac medications before your next appointment, please call your pharmacy.  Labwork: None Ordered   Testing/Procedures: Your physician has recommended that you wear a holter monitor for 48 hour. Holter monitors are medical devices that record the heart's electrical activity. Doctors most often use these monitors to diagnose arrhythmias. Arrhythmias are problems with the speed or rhythm of the heartbeat. The monitor is a small, portable device. You can wear one while you do your normal daily activities. This is usually used to diagnose what is causing palpitations/syncope (passing out).  Follow-Up: Your physician wants you to follow-up in: 1 Month.    Thank you for choosing [facility] at [facility]!!

## 2016-11-27 NOTE — Progress Notes (Deleted)
Patient ID: Danny Fuller, male   DOB: 08-03-1975, 41 y.o.   MRN: [id number]            Reason for Appointment: Follow-up for Type 2 Diabetes  Referring physician: Sarajane Fuller   History of Present Illness:          Date of diagnosis of type 2 diabetes mellitus: 2007        Background history:   At diagnosis he was relatively asymptomatic and was started on metformin and glipizide He has been on the same oral hypoglycemic regimen for several years. Review of his A1c indicates it has been markedly increased persistently with the lowest reading in 2015 being 9.1  Recent history:   INSULIN regimen is:  V-go pump with basal 30 units and boluses 6 units at meals       Non-insulin hypoglycemic drugs the patient is taking are: Metformin ER 2000 mg daily Because of poor control he was started on the V-go pump on 05/03/16.  His A1c is most recently 8.4%, previously 11.6 His fructosamine is improved at 305 now  Current management, blood sugar patterns and problems identified:  He was told to start adding NovoLog using a pen at least 6 units at meal times in addition to his boluses with the pump to improve his postprandial hyperglycemia on his last visit  Also started on Jardiance in addition to his metformin  Blood sugars are Fuller better, previously averaging at least 230 after lunch and dinner  Also his FASTING readings are better, averaging about 150 compared to 171 before and much more consistent  He has tried to be consistent with diet and is trying to be more active also Fuller  However has gained 10 pounds  This is despite starting JARDIANCE 10 mg daily  HIGHEST blood sugars are Fuller late at night after supper and periodically over 200 at bedtime; however lab glucose was 219 midday        Side effects from medications have been: None  Compliance with the medical regimen: Improving Hypoglycemia:   none  Glucose monitoring:  freestyle Libre sensor  Blood Glucose  readings by time of day from download analysis:  Mean values apply above for all meters except median for One Touch  PRE-MEAL Fasting Lunch Dinner Bedtime Fuller  Glucose range:    195    Mean/median: 149  176  177   176    POST-MEAL PC Breakfast PC Lunch PC Dinner  Glucose range:     Mean/median:  199       Self-care: Meal times are:  Breakfast is at 9-10 am, Lunch-1 PM : Dinner: 8 pm   Typical meal intake: Breakfast is Usually toast and boiled eggs.  Usually getting cold cut sandwiches at lunch, grilled chicken and vegetables/potatoes at dinner.  Snacks are usually chips, crackers                Dietician visit, most recent: 05/02/16               Exercise:  some walking, limited by pain  Weight history: His highest weight in the past has been 378    Wt Readings from Last 3 Encounters:  11/27/16 244 lb 6.4 oz (110.9 kg)  09/04/16 259 lb 9.6 oz (117.8 kg)  08/03/16 253 lb (114.8 kg)    Glycemic control:   Lab Results  Component Value Date   HGBA1C 10.8 (H) 11/24/2016   HGBA1C 8.4 (H) 06/19/2016   HGBA1C 11.6 (  H) 03/06/2016   Lab Results  Component Value Date   MICROALBUR 11.8 (H) 03/23/2016   LDLCALC 51 03/23/2016   CREATININE 1.15 11/24/2016   Lab Results  Component Value Date   MICRALBCREAT 7.2 03/23/2016    Lab Results  Component Value Date   FRUCTOSAMINE 305 (H) 09/01/2016   FRUCTOSAMINE 334 (H) 07/27/2016   FRUCTOSAMINE 406 (H) 03/23/2016      Allergies as of 11/28/2016   No Known Allergies     Medication List       Accurate as of 11/27/16  9:48 PM. Always use your most recent med list.          accu-chek softclix lancets Use as instructed   amLODipine-benazepril 10-40 MG capsule Commonly known as:  LOTREL Take 1 capsule by mouth daily.   cloNIDine 0.1 mg/24hr patch Commonly known as:  CATAPRES - Dosed in mg/24 hr apply 1 patch every week   empagliflozin 25 MG Tabs tablet Commonly known as:  JARDIANCE Take 25 mg by mouth  daily.   glucose blood test strip Commonly known as:  ACCU-CHEK AVIVA Use to test blood sugar 1 time daily   hydrALAZINE 25 MG tablet Commonly known as:  APRESOLINE Take 1 tablet (25 mg total) by mouth every 8 (eight) hours.   insulin aspart 100 UNIT/ML FlexPen Commonly known as:  NOVOLOG Inject 66 Units into the skin 3 (three) times daily before meals.   NOVOLOG 100 UNIT/ML injection Generic drug:  insulin aspart INJECT 66 UNITS INTO THE SKIN 3 TIMES DAILY BEFORE MEALS   meloxicam 15 MG tablet Commonly known as:  MOBIC Take 1 tablet (15 mg total) by mouth daily.   metFORMIN 500 MG 24 hr tablet Commonly known as:  GLUCOPHAGE-XR Take 4 tablets (2,000 mg total) by mouth daily with supper.   pravastatin 40 MG tablet Commonly known as:  PRAVACHOL Take 1 tablet (40 mg total) by mouth every evening.   pregabalin 150 MG capsule Commonly known as:  LYRICA Take 1 capsule (150 mg total) by mouth 2 (two) times daily.   traMADol 50 MG tablet Commonly known as:  ULTRAM take 2 tablets by mouth every 6 hours if needed for pain   V-GO 30 Kit Use 1 kit a day with insulin       Allergies: No Known Allergies  Past Medical History:  Diagnosis Date  . Asthma   . Diabetes mellitus    sees Dr. Dwyane Fuller   . GERD (gastroesophageal reflux disease)   . Headache(784.0)   . Hypertension     Past Surgical History:  Procedure Laterality Date  . AMPUTATION Left 07/26/2015   Procedure: AMPUTATION LEFT FIFTH RAY;  Surgeon: Danny Minion, MD;  Location: [place];  Service: Orthopedics;  Laterality: Left;  . bilateral hip pins placed    . INCISION AND DRAINAGE PERIRECTAL ABSCESS Right 03/07/2016   Procedure: IRRIGATION AND DEBRIDEMENT PERIRECTAL ABSCESS;  Surgeon: Danny Overall, MD;  Location: [facility];  Service: General;  Laterality: Right;  . INCISION AND DRAINAGE PERIRECTAL ABSCESS Right 03/09/2016   Procedure: EXAM UNDER ANESTHESIA, IRRIGATION AND DEBRIDEMENT PERIRECTAL ABSCESS;  Surgeon: Danny Hausen, MD;  Location: [facility];  Service: General;  Laterality: Right;  Open, betadine packed wound    Family History  Problem Relation Age of Onset  . Arthritis Unknown   . Diabetes Unknown   . Hypertension Unknown   . Hyperlipidemia Unknown   . Stroke Unknown   . Sudden death Unknown   .  Diabetes Mother   . Diabetes Sister   . Diabetes Brother   . Heart attack Father        Died ate 22, couple of "heart attacks"     Social History:  reports that he quit smoking about 16 months ago. His smoking use included Cigarettes. He has a 10.00 pack-year smoking history. He has never used smokeless tobacco. He reports that he does not drink alcohol or use drugs.   Review of Systems   Lipid history: LDL has been low as also HDL without treatment    Lab Results  Component Value Date   CHOL 110 03/23/2016   HDL 28.70 (L) 03/23/2016   LDLCALC 51 03/23/2016   TRIG 152.0 (H) 03/23/2016   CHOLHDL 4 03/23/2016           Hypertension: On Rx For several years, followed by PCP, is taking Lotrel, clonidine patch, Chlorthalidone and hydralazine  He has Somewhat blood pressure readings now with adding Jardiance to his regimen   BP Readings from Last 3 Encounters:  11/27/16 (!) 130/92  09/04/16 140/88  08/03/16 (!) 136/92    EDEMA: He thinks his legs get swollen and sometimes especially at the left 1 around midday May have been better with taking Lasix which was stopped with starting Jardiance No change in renal function with starting this  Lab Results  Component Value Date   CREATININE 1.15 11/24/2016   BUN 14 11/24/2016   NA 136 11/24/2016   K 4.7 11/24/2016   CL 101 11/24/2016   CO2 28 11/24/2016      Most recent foot exam: 8/17  He has sharp pains in his feet from neuropathy,  on Lyrica and he is still having Significant pain and numbness   He was given Generic sildenafil but Still cannot afford this   LABS:  Lab on 11/24/2016  Component Date Value Ref Range Status    . Hgb A1c MFr Bld 11/24/2016 10.8* 4.6 - 6.5 % Final   Glycemic Control Guidelines for People with Diabetes:Non Diabetic:  <6%Goal of Therapy: <7%Additional Action Suggested:  >8%   . Sodium 11/24/2016 136  135 - 145 mEq/L Final  . Potassium 11/24/2016 4.7  3.5 - 5.1 mEq/L Final  . Chloride 11/24/2016 101  96 - 112 mEq/L Final  . CO2 11/24/2016 28  19 - 32 mEq/L Final  . Glucose, Bld 11/24/2016 318* 70 - 99 mg/dL Final  . BUN 11/24/2016 14  6 - 23 mg/dL Final  . Creatinine, Ser 11/24/2016 1.15  0.40 - 1.50 mg/dL Final  . Calcium 11/24/2016 9.4  8.4 - 10.5 mg/dL Final  . GFR 11/24/2016 90.87  >60.00 mL/min Final    Physical Examination:  There were no vitals taken for this visit.  No significant pedal edema, Has minimal lower leg edema  ASSESSMENT:  Diabetes type 2, uncontrolled With BMI 36 He has had long-standing diabetes with Previously persistently poor control  See history of present illness for detailed discussion of current diabetes management, blood sugar patterns and problems identified   His blood sugars are somewhat better with increasing insulin and starting Jardiance as above He is trying to be more active Diet is somewhat better Highest blood sugars are after lunch and supper despite taking his boluses with the pump and additional 6 units of NovoLog with the pen    HYPERTENSION: control is improving   PLAN:    He will  increase the insulin at mealtimes to 10 units with the pen  in addition to his boluses   Jardiance 25 mg daily, he can finish the current prescription with 2 tablets daily  May take Lasix as needed if he is having edema  Recheck A1c on the next visit    There are no Patient Instructions on file for this visit.  Tidus Upchurch 11/27/2016, 9:48 PM   Note: This office note was prepared with Dragon voice recognition system technology. Any transcriptional errors that result from this process are unintentional.

## 2016-11-28 ENCOUNTER — Ambulatory Visit: Payer: BC Managed Care – PPO | Admitting: Endocrinology

## 2016-11-28 ENCOUNTER — Encounter: Payer: Self-pay | Admitting: Cardiology

## 2016-11-28 DIAGNOSIS — R002 Palpitations: Secondary | ICD-10-CM | POA: Insufficient documentation

## 2016-11-28 DIAGNOSIS — I739 Peripheral vascular disease, unspecified: Secondary | ICD-10-CM | POA: Insufficient documentation

## 2016-11-28 NOTE — Progress Notes (Signed)
Patient ID: Danny Fuller, male   DOB: 05-Oct-1975, 41 y.o.   MRN: [id number]            Reason for Appointment: Follow-up for Type 2 Diabetes  Referring physician: Sarajane Jews   History of Present Illness:          Date of diagnosis of type 2 diabetes mellitus: 2007        Background history:   At diagnosis he was relatively asymptomatic and was started on metformin and glipizide He has been on the same oral hypoglycemic regimen for several years. Review of his A1c indicates it has been markedly increased persistently with the lowest reading in 2015 being 9.1  Recent history:   INSULIN regimen is:  V-go pump with basal 30 units and boluses 6 units at meals       Non-insulin hypoglycemic drugs the patient is taking are: Metformin ER 2000 mg daily, Jardiance 25 mg daily Because of poor control he was started on the V-go pump on 05/03/16.  His A1c is most recently 10.8%, previously 8.4%  Current management, blood sugar patterns and problems identified:  He was unable to get his insulin and his V-go pump for some time because of insurance issues  His sugars were nearly 400 when he was not able to get the insulin and he did not call to report this difficulty and was only able to take NovoLog without any basal insulin   However even though he is taking his insulin with the V-go pump for the last 3 weeks or so his blood sugars are still out of control, they were reasonably good on the last visit  Now his fasting readings are also high  With current regimen of the V-go pump he is not getting enough bolus insulin available for his mealtime control  He thinks his highest blood sugars are after lunch but his blood sugar after breakfast was 318 in the lab last week even though he had a low carbohydrate breakfast  He is generally trying to watch his carbohydrates but blood sugars are high after most meals  He has lost significant amount of weight since last visit  Now taking 25 mg Jardiance  with no side effects or change in renal function  Also not able to use his freestyle Elenor Legato recently because of insurance issues        Side effects from medications have been: None  Compliance with the medical regimen: Improving Hypoglycemia:   none  Glucose monitoring:  was on freestyle Libre sensor, currently using Accu-Chek  Blood Glucose readings by recall:  Mean values apply above for all meters except median for One Touch  PRE-MEAL Fasting Lunch Dinner Bedtime Overall  Glucose range: 160-180 140 180 200   Mean/median:        POST-MEAL PC Breakfast PC Lunch PC Dinner  Glucose range:  300   Mean/median:       Self-care: Meal times are:  Breakfast is at 9-10 am, Lunch-1 PM : Dinner: 8 pm   Typical meal intake: Breakfast is Usually toast and boiled eggs.  Usually getting cold cut sandwiches at lunch, grilled chicken and vegetables/potatoes at dinner.  Snacks are usually chips, crackers                Dietician visit, most recent: 05/02/16               Exercise:  some walking, limited by pain  Weight history: His highest weight in the past  has been 378    Wt Readings from Last 3 Encounters:  11/29/16 246 lb (111.6 kg)  11/27/16 244 lb 6.4 oz (110.9 kg)  09/04/16 259 lb 9.6 oz (117.8 kg)    Glycemic control:   Lab Results  Component Value Date   HGBA1C 10.8 (H) 11/24/2016   HGBA1C 8.4 (H) 06/19/2016   HGBA1C 11.6 (H) 03/06/2016   Lab Results  Component Value Date   MICROALBUR 11.8 (H) 03/23/2016   LDLCALC 51 03/23/2016   CREATININE 1.15 11/24/2016   Lab Results  Component Value Date   MICRALBCREAT 7.2 03/23/2016    Lab Results  Component Value Date   FRUCTOSAMINE 305 (H) 09/01/2016   FRUCTOSAMINE 334 (H) 07/27/2016   FRUCTOSAMINE 406 (H) 03/23/2016      Allergies as of 11/29/2016   No Known Allergies     Medication List       Accurate as of 11/29/16 12:12 PM. Always use your most recent med list.          accu-chek softclix  lancets Use as instructed   amLODipine-benazepril 10-40 MG capsule Commonly known as:  LOTREL Take 1 capsule by mouth daily.   cloNIDine 0.1 mg/24hr patch Commonly known as:  CATAPRES - Dosed in mg/24 hr apply 1 patch every week   empagliflozin 25 MG Tabs tablet Commonly known as:  JARDIANCE Take 25 mg by mouth daily.   glucose blood test strip Commonly known as:  ACCU-CHEK AVIVA Use to test blood sugar 1 time daily   hydrALAZINE 25 MG tablet Commonly known as:  APRESOLINE Take 1 tablet (25 mg total) by mouth every 8 (eight) hours.   insulin aspart 100 UNIT/ML FlexPen Commonly known as:  NOVOLOG Inject 66 Units into the skin 3 (three) times daily before meals.   NOVOLOG 100 UNIT/ML injection Generic drug:  insulin aspart INJECT 66 UNITS INTO THE SKIN 3 TIMES DAILY BEFORE MEALS   meloxicam 15 MG tablet Commonly known as:  MOBIC Take 1 tablet (15 mg total) by mouth daily.   metFORMIN 500 MG 24 hr tablet Commonly known as:  GLUCOPHAGE-XR Take 4 tablets (2,000 mg total) by mouth daily with supper.   pravastatin 40 MG tablet Commonly known as:  PRAVACHOL Take 1 tablet (40 mg total) by mouth every evening.   pregabalin 150 MG capsule Commonly known as:  LYRICA Take 1 capsule (150 mg total) by mouth 2 (two) times daily.   traMADol 50 MG tablet Commonly known as:  ULTRAM take 2 tablets by mouth every 6 hours if needed for pain   V-GO 30 Kit Use 1 kit a day with insulin       Allergies: No Known Allergies  Past Medical History:  Diagnosis Date  . Asthma   . Diabetes mellitus    sees Dr. Dwyane Dee   . GERD (gastroesophageal reflux disease)   . Headache(784.0)   . Hypertension     Past Surgical History:  Procedure Laterality Date  . AMPUTATION Left 07/26/2015   Procedure: AMPUTATION LEFT FIFTH RAY;  Surgeon: [name], MD;  Location: [facility];  Service: Orthopedics;  Laterality: Left;  . bilateral hip pins placed    . INCISION AND DRAINAGE PERIRECTAL ABSCESS  Right 03/07/2016   Procedure: IRRIGATION AND DEBRIDEMENT PERIRECTAL ABSCESS;  Surgeon: Alphonsa Overall, MD;  Location: [facility];  Service: General;  Laterality: Right;  . INCISION AND DRAINAGE PERIRECTAL ABSCESS Right 03/09/2016   Procedure: EXAM UNDER ANESTHESIA, IRRIGATION AND DEBRIDEMENT PERIRECTAL ABSCESS;  Surgeon:  Johnathan Hausen, MD;  Location: [facility];  Service: General;  Laterality: Right;  Open, betadine packed wound    Family History  Problem Relation Age of Onset  . Arthritis Unknown   . Diabetes Unknown   . Hypertension Unknown   . Hyperlipidemia Unknown   . Stroke Unknown   . Sudden death Unknown   . Diabetes Mother   . Diabetes Sister   . Diabetes Brother   . Heart attack Father        Died ate 73, couple of "heart attacks"     Social History:  reports that he quit smoking about 16 months ago. His smoking use included Cigarettes. He has a 10.00 pack-year smoking history. He has never used smokeless tobacco. He reports that he does not drink alcohol or use drugs.   Review of Systems   Lipid history: LDL has been low as also HDL without treatment    Lab Results  Component Value Date   CHOL 110 03/23/2016   HDL 28.70 (L) 03/23/2016   LDLCALC 51 03/23/2016   TRIG 152.0 (H) 03/23/2016   CHOLHDL 4 03/23/2016           Hypertension: On Rx For several years, followed by PCP, is taking Lotrel, clonidine patch, and hydralazine Was on chlorthalidone and not clear how this was stopped, blood pressure was much higher on first measurement today    BP Readings from Last 3 Encounters:  11/29/16 132/82  11/27/16 (!) 130/92  09/04/16 140/88    EDEMA: He thinks this is better and not taking Lasix now, has mild chronic swelling of the left lower leg  Lab Results  Component Value Date   CREATININE 1.15 11/24/2016   BUN 14 11/24/2016   NA 136 11/24/2016   K 4.7 11/24/2016   CL 101 11/24/2016   CO2 28 11/24/2016      Most recent foot exam: 8/17  He has sharp pains in  his feet from neuropathy,  on Lyrica and he is still having Significant pain and numbness     LABS:  Lab on 11/24/2016  Component Date Value Ref Range Status  . Hgb A1c MFr Bld 11/24/2016 10.8* 4.6 - 6.5 % Final   Glycemic Control Guidelines for People with Diabetes:Non Diabetic:  <6%Goal of Therapy: <7%Additional Action Suggested:  >8%   . Sodium 11/24/2016 136  135 - 145 mEq/L Final  . Potassium 11/24/2016 4.7  3.5 - 5.1 mEq/L Final  . Chloride 11/24/2016 101  96 - 112 mEq/L Final  . CO2 11/24/2016 28  19 - 32 mEq/L Final  . Glucose, Bld 11/24/2016 318* 70 - 99 mg/dL Final  . BUN 11/24/2016 14  6 - 23 mg/dL Final  . Creatinine, Ser 11/24/2016 1.15  0.40 - 1.50 mg/dL Final  . Calcium 11/24/2016 9.4  8.4 - 10.5 mg/dL Final  . GFR 11/24/2016 90.87  >60.00 mL/min Final    Physical Examination:  BP 132/82 (BP Location: Right Arm, Cuff Size: Large)   Pulse 96   Ht 5' 9.5" (1.765 m)   Wt 246 lb (111.6 kg)   SpO2 98%   BMI 35.81 kg/m   No significant pedal edema, Has minimal lower leg edema  ASSESSMENT:  Diabetes type 2, uncontrolled With BMI 36 He has had long-standing diabetes with Previously persistently poor control  See history of present illness for detailed discussion of current diabetes management, blood sugar patterns and problems identified   His blood sugars are much worse overall with increased  A1c of over 10% This is partly rels insulin and be-go supplies Now even with getting all his supplies in the pump he still has overall high readings with only some readings close to target at lunchtime and mostly higher after meals  He is trying to be active Diet is somewhat better Highest blood sugars are after lunch and supper despite taking his boluses with the pump and additional 6 units of NovoLog with the pen  Discussed day-to-day management of his diabetes including insulin delivery and mealtime insulin along with consistent diet and exercise  HYPERTENSION: control  is improving even without taking chlorthalidone, not clear why this was stopped but probably benefiting from Jardiance 25 mg and we will continue  PLAN:    He will  increase the basal rate on his V-go to 40 units  Discussed in detail the use of the Omnipod insulin pump which may give him more flexibility and increased availability of mealtime insulin, also will be useful in doing correction doses and estimation based on carbohydrates if needed  He was given a brochure on this and a sample and he will contact the company for insurance coverage  Start taking extra 2 units for breakfast and lunch on the pump and at least 4-6 units more for suppertime coverage using the NovoLog pen  Stay on Jardiance  Continue consistently low at low carbohydrate diet  Discussed checking blood sugars at various times in the daytime including after meals  May also consider a GLP-1 drug if needed  Influenza vaccine given Counseling time on subjects discussed in assessment and plan sections is over 50% of today's 25 minute visit    Patient Instructions  7 clicks at [facility] and lunch    Yolunda Kloos 11/29/2016, 12:12 PM   Note: This office note was prepared with Estate agent. Any transcriptional errors that result from this process are unintentional.

## 2016-11-29 ENCOUNTER — Ambulatory Visit (INDEPENDENT_AMBULATORY_CARE_PROVIDER_SITE_OTHER): Payer: Medicaid Other | Admitting: Endocrinology

## 2016-11-29 ENCOUNTER — Encounter: Payer: Self-pay | Admitting: Endocrinology

## 2016-11-29 ENCOUNTER — Other Ambulatory Visit: Payer: Self-pay

## 2016-11-29 VITALS — BP 132/82 | HR 96 | Ht 69.5 in | Wt 246.0 lb

## 2016-11-29 DIAGNOSIS — Z794 Long term (current) use of insulin: Secondary | ICD-10-CM | POA: Diagnosis not present

## 2016-11-29 DIAGNOSIS — Z23 Encounter for immunization: Secondary | ICD-10-CM | POA: Diagnosis not present

## 2016-11-29 DIAGNOSIS — I10 Essential (primary) hypertension: Secondary | ICD-10-CM

## 2016-11-29 DIAGNOSIS — E1165 Type 2 diabetes mellitus with hyperglycemia: Secondary | ICD-10-CM | POA: Diagnosis not present

## 2016-11-29 MED ORDER — V-GO 30 KIT: PACK | 12 refills | Status: DC

## 2016-11-29 MED ORDER — V-GO 40 KIT: PACK | 3 refills | Status: DC

## 2016-11-29 NOTE — Patient Instructions (Signed)
7 clicks at [employer] and lunch

## 2016-12-08 ENCOUNTER — Telehealth: Payer: Self-pay

## 2016-12-08 NOTE — Telephone Encounter (Signed)
Patient called today and stated that he was told the Omnipod should be coming to his house but it has not arrived yet I have not been able to find a note stating this has been done please advise where we are on this

## 2016-12-11 ENCOUNTER — Ambulatory Visit (INDEPENDENT_AMBULATORY_CARE_PROVIDER_SITE_OTHER): Payer: Medicaid Other

## 2016-12-11 ENCOUNTER — Other Ambulatory Visit: Payer: Self-pay

## 2016-12-11 ENCOUNTER — Ambulatory Visit (INDEPENDENT_AMBULATORY_CARE_PROVIDER_SITE_OTHER): Payer: Medicaid Other | Admitting: Neurology

## 2016-12-11 ENCOUNTER — Encounter: Payer: Self-pay | Admitting: Neurology

## 2016-12-11 ENCOUNTER — Other Ambulatory Visit (INDEPENDENT_AMBULATORY_CARE_PROVIDER_SITE_OTHER): Payer: Medicaid Other

## 2016-12-11 VITALS — BP 120/70 | HR 102 | Ht 69.5 in | Wt 253.0 lb

## 2016-12-11 DIAGNOSIS — R002 Palpitations: Secondary | ICD-10-CM

## 2016-12-11 DIAGNOSIS — E1142 Type 2 diabetes mellitus with diabetic polyneuropathy: Secondary | ICD-10-CM

## 2016-12-11 LAB — FOLATE: FOLATE: 8.5 ng/mL (ref >5.9)

## 2016-12-11 LAB — VITAMIN B12: Vitamin B-12: 438 pg/mL (ref 211–911)

## 2016-12-11 MED ORDER — CLONIDINE 0.1 MG/24HR TD PTWK: 0.1000 mg | MEDICATED_PATCH | TRANSDERMAL | 3 refills | Status: DC

## 2016-12-11 MED ORDER — PREGABALIN 100 MG PO CAPS: ORAL_CAPSULE | ORAL | 5 refills | Status: DC

## 2016-12-11 NOTE — Telephone Encounter (Signed)
Called [employer] with [employer] and left a voice message to see if he has started this process for patient.

## 2016-12-11 NOTE — Telephone Encounter (Signed)
Called Danny Fuller and let him know that Danny Fuller from [employer] will be reaching out to him and he stated that he already has.   Danny Fuller stated that Danny Fuller will need a C-Peptide and fasting blood glucose for Medicaid to approve the Omnipod. Please order for Danny Fuller.

## 2016-12-11 NOTE — Telephone Encounter (Signed)
He is supposed to call the company to get coverage for Omnipod

## 2016-12-11 NOTE — Progress Notes (Signed)
[facility] Clinic Note - Initial Visit   Date: 12/11/16  Danny Fuller MRN: [id number] DOB: August 22, 1975   Dear Dr. Sarajane Jews:  Thank you for your kind referral of Danny Fuller for consultation of neuropathy. Although his history is well known to you, please allow [name] to reiterate it for the purpose of our medical record. The patient was accompanied to the clinic by self.   History of Present Illness: Danny Fuller is a 41 y.o. left-handed African American male with diabetes mellitus, GERD, and hypertension presenting for evaluation of diabetic neuropathy.    He was diagnosed with diabetes in 2002 and began having having numbness of the feet in 2016.  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  Numbness involves the entire feet, which is worse on the left.  He also complain of tingling involving the tingling and lower legs.  He complains of imbalance and has not suffered any falls.  He walks unassisted.  He takes Lyrica 160m twice daily which provides about 35% relief and allows him to sleep.  He has tried gabapentin, which did not provide any relief.   He also complains of numbness and tingling over the upper arm and into his finger tips, which is worse on the right.  He has weakness of the hands and frequently drops objects.  He also complains of achy neck pain.    He also complains of achy bilateral shoulder pain and reduced range of motion, for which he takes tramadol.   He is not working and last worked in 2017 in [employer] service [employer]  He used to drink heavily on the weekends from the ages of [age] (1 pint liquor).  His mother, brother, sister, and materal grandmother also have diabetic neuropathy.  His mother had three toes amputated and brother had BKA.   Out-side paper records, electronic medical record, and images have been reviewed where available and summarized as:  Lab Results  Component  Value Date   HGBA1C 10.8 (H) 11/24/2016    Lab Results  Component Value Date   TSH 1.02 04/23/2013   Lab Results  Component Value Date   CREATININE 1.15 11/24/2016   BUN 14 11/24/2016   NA 136 11/24/2016   K 4.7 11/24/2016   CL 101 11/24/2016   CO2 28 11/24/2016    Past Medical History:  Diagnosis Date  . Asthma   . Diabetes mellitus    sees Dr. KDwyane Dee  . GERD (gastroesophageal reflux disease)   . Headache(784.0)   . Hypertension     Past Surgical History:  Procedure Laterality Date  . AMPUTATION Left 07/26/2015   Procedure: AMPUTATION LEFT FIFTH RAY;  Surgeon: MNewt Minion MD;  Location: [facility]  Service: Orthopedics;  Laterality: Left;  . bilateral hip pins placed    . INCISION AND DRAINAGE PERIRECTAL ABSCESS Right 03/07/2016   Procedure: IRRIGATION AND DEBRIDEMENT PERIRECTAL ABSCESS;  Surgeon: DAlphonsa Overall MD;  Location: [facility];  Service: General;  Laterality: Right;  . INCISION AND DRAINAGE PERIRECTAL ABSCESS Right 03/09/2016   Procedure: EXAM UNDER ANESTHESIA, IRRIGATION AND DEBRIDEMENT PERIRECTAL ABSCESS;  Surgeon: MJohnathan Hausen MD;  Location: [facility];  Service: General;  Laterality: Right;  Open, betadine packed wound     Medications:  Outpatient Encounter Prescriptions as of 12/11/2016  Medication Sig  . amLODipine-benazepril (LOTREL) 10-40 MG capsule Take 1 capsule by mouth daily.  . cloNIDine (CATAPRES - DOSED IN MG/24  HR) 0.1 mg/24hr patch apply 1 patch every week  . empagliflozin (JARDIANCE) 25 MG TABS tablet Take 25 mg by mouth daily.  Marland Kitchen glucose blood (ACCU-CHEK AVIVA) test strip Use to test blood sugar 1 time daily  . hydrALAZINE (APRESOLINE) 25 MG tablet Take 1 tablet (25 mg total) by mouth every 8 (eight) hours.  . insulin aspart (NOVOLOG) 100 UNIT/ML FlexPen Inject 66 Units into the skin 3 (three) times daily before meals.  . Insulin Disposable Pump (V-GO 40) KIT Use one pump every 24 hours  . Lancet Devices (ACCU-CHEK SOFTCLIX) lancets Use as  instructed  . meloxicam (MOBIC) 15 MG tablet Take 1 tablet (15 mg total) by mouth daily.  . metFORMIN (GLUCOPHAGE-XR) 500 MG 24 hr tablet Take 4 tablets (2,000 mg total) by mouth daily with supper.  Marland Kitchen NOVOLOG 100 UNIT/ML injection INJECT 66 UNITS INTO THE SKIN 3 TIMES DAILY BEFORE MEALS  . pravastatin (PRAVACHOL) 40 MG tablet Take 1 tablet (40 mg total) by mouth every evening.  . pregabalin (LYRICA) 150 MG capsule Take 1 capsule (150 mg total) by mouth 2 (two) times daily.  . traMADol (ULTRAM) 50 MG tablet take 2 tablets by mouth every 6 hours if needed for pain   No facility-administered encounter medications on file as of 12/11/2016.      Allergies: No Known Allergies  Family History: Family History  Problem Relation Age of Onset  . Arthritis Unknown   . Diabetes Unknown   . Hypertension Unknown   . Hyperlipidemia Unknown   . Stroke Unknown   . Sudden death Unknown   . Diabetes Mother   . Diabetes Sister   . Diabetes Brother   . Heart attack Father        Died ate 5, couple of "heart attacks"     Social History: Social History  Substance Use Topics  . Smoking status: Former Smoker    Packs/day: 0.50    Years: 20.00    Types: Cigarettes    Quit date: 07/23/2015  . Smokeless tobacco: Never Used  . Alcohol use No   Social History   Social History Narrative   Lives with wife and two children in a one story home.  Not working.  Still trying for disability.  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  Education: high school.     Review of Systems:  CONSTITUTIONAL: No fevers, chills, night sweats, or weight loss.   EYES: No visual changes or eye pain ENT: No hearing changes.  No history of nose bleeds.   RESPIRATORY: No cough, wheezing and shortness of breath.   CARDIOVASCULAR: Negative for chest pain, and palpitations.   GI: Negative for abdominal discomfort, blood in stools or black stools.  No recent change in bowel habits.   GU:  No history of  incontinence.   MUSCLOSKELETAL: +history of joint pain or swelling.  No myalgias.   SKIN: Negative for lesions, rash, and itching.   HEMATOLOGY/ONCOLOGY: Negative for prolonged bleeding, bruising easily, and swollen nodes.  No history of cancer.   ENDOCRINE: Negative for cold or heat intolerance, polydipsia or goiter.   PSYCH:  +depression or anxiety symptoms.   NEURO: As Above.   Vital Signs:  BP 120/70   Pulse (!) 102   Ht 5' 9.5" (1.765 m)   Wt 253 lb (114.8 kg)   SpO2 96%   BMI 36.83 kg/m    General Medical Exam:   General:  Well appearing, comfortable.   Eyes/ENT: see cranial  nerve examination.   Neck: No masses appreciated.  Full range of motion without tenderness.  No carotid bruits. Respiratory:  Clear to auscultation, good air entry bilaterally.   Cardiac:  Regular rate and rhythm, no murmur.   Extremities:  S/p left 5th digit amputation.   Skin:  Several years of healing ulcers on the arms and hands.  Neurological Exam: MENTAL STATUS including orientation to time, place, person, recent and remote memory, attention span and concentration, language, and fund of knowledge is normal.  Speech is not dysarthric.  CRANIAL NERVES: II:  No visual field defects.  Unremarkable fundi.   III-IV-VI: Pupils equal round and reactive to light.  Normal conjugate, extra-ocular eye movements in all directions of gaze.  No nystagmus.  No ptosis.   V:  Normal facial sensation.     VII:  Normal facial symmetry and movements.  VIII:  Normal hearing and vestibular function.   IX-X:  Normal palatal movement.   XI:  Normal shoulder shrug and head rotation.   XII:  Normal tongue strength and range of motion, no deviation or fasciculation.  MOTOR:  No atrophy, fasciculations or abnormal movements.  No pronator drift.  Tone is normal.    Right Upper Extremity:    Left Upper Extremity:    Deltoid  5/5   Deltoid  5/5   Biceps  5/5   Biceps  5/5   Triceps  5/5   Triceps  5/5   Wrist extensors   5/5   Wrist extensors  5/5   Wrist flexors  5/5   Wrist flexors  5/5   Finger extensors  5/5   Finger extensors  5/5   Finger flexors  5/5   Finger flexors  5/5   Dorsal interossei  4+/5   Dorsal interossei  4+/5   Abductor pollicis  4+/5   Abductor pollicis  4+/5   Tone (Ashworth scale)  0  Tone (Ashworth scale)  0   Right Lower Extremity:    Left Lower Extremity:    Hip flexors  5/5   Hip flexors  5/5   Hip extensors  5/5   Hip extensors  5/5   Knee flexors  5/5   Knee flexors  5/5   Knee extensors  5/5   Knee extensors  5/5   Dorsiflexors  5/5   Dorsiflexors  5/5   Plantarflexors  5/5   Plantarflexors  5/5   Toe extensors  4/5   Toe extensors  4/5   Toe flexors  4/5   Toe flexors  4/5   Tone (Ashworth scale)  0  Tone (Ashworth scale)  0   MSRs:  Right                                                                 Left brachioradialis 2+  brachioradialis 2+  biceps 2+  biceps 2+  triceps 2+  triceps 2+  patellar 2+  patellar 2+  ankle jerk 0  ankle jerk 0  Hoffman no  Hoffman no  plantar response mute  plantar response mute   SENSORY: Vibration is reduced at the knees bilaterally, absent at the ankles on the right, trace on the left.  Pinprick and temperature is reduced distal to the knees bilaterally.  Proprioception is impaired.  There is also a gradient pattern of sensory loss from the mid forearms into the hands to temperature and pinprick.  There is positive Romberg sign.   COORDINATION/GAIT: Normal finger-to- nose-finger.  Intact rapid alternating movements bilaterally.  Able to rise from a chair without using arms.  Gait mildly wide-based and stable. He is unable to perform tandem gait.   IMPRESSION: Mr. Kobashigawa is a 41 year-old man referred for diabetic neuropathy.  He has severe sensory loss involving a stocking-glove distribution, sensory ataxia, and distal weakness.  I had extensive discussion with the patient regarding the pathogenesis, etiology, management, and  natural course of diabetic neuropathy.   I emphasized tight control of diabetes, foot care, and fall precautions.  He was encouraged to add grab bars to his bathroom and use night lights to minimize risk of falls.  To be sure there is no other reason contributing to his neuropathy, I will check the following labs: vitamin B12, vitamin B1, folate, copper, SPEP with IFE.    He will also be scheduled for NCS/EMG of the right arm and leg to assess severity of neuropathy and be sure there is no overlapping entrapment neuropathy of the upper extremity  For pain, I will optimize Lyrica to 155m in the morning and afternoon, and 2010mat bedtime.   Return to clinic in 4 months.   The duration of this appointment visit was 50 minutes of face-to-face time with the patient.  Greater than 50% of this time was spent in counseling, explanation of diagnosis, planning of further management, and coordination of care.   Thank you for allowing me to participate in patient's care.  If I can answer any additional questions, I would be pleased to do so.    Sincerely,    Ples Trudel K. PaPosey ProntoDO

## 2016-12-11 NOTE — Patient Instructions (Addendum)
Start Lyrica 100mg  in the morning, 100mg  in the afternoon, and 200mg  at bedtime  Check labs  NCS/EMG of the right arm and leg  Place a grab bar in your bathroom.  Do not wear flip flops.  Wear shoes with good support which fit you well  Return to clinic in 4 months  [facility]  Preventing Falls in the [place] are common, often dreaded events in the lives of older people. Aside from the obvious injuries and even death that may result, falls can cause wide-ranging consequences including loss of independence, mental decline, decreased activity, and mobility. Younger people are also at risk of falling, especially those with chronic illnesses and fatigue.  Ways to reduce the risk for falling:  * Examine diet and medications. Warm foods and alcohol dilate blood vessels, which can lead to dizziness when standing. Sleep aids, antidepressants, and pain medications can also increase the likelihood of a fall.  * Get a vison exam. Poor vision, cataracts, and glaucoma increase the chances of falling.  * Check foot gear. Shoes should fit snugly and have a sturdy, nonskid sole and broad, low heel.  * Participate in a physician-approved exercise program to build and maintain muscle strength and improve balance and coordination.  * Increase vitamin D intake. Vitamin D improves muscle strength and increases the amount of calcium the body is able to absorb and deposit in bones.  How to prevent falls from common hazards:  * Floors - Remove all loose wires, cords, and throw rugs. Minimize clutter. Make sure rugs are anchored and smooth. Keep furniture in its usual place.  * Chairs - Use chairs with straight backs, armrests, and firm seats. Add firm cushions to existing pieces to add height.  * Bathroom - Install grab bars and non-skid tape in the tub or shower. Use a bathtub transfer bench or a shower chair with a back support. Use an elevated toilet seat and/or safety rails to assist standing from a  low surface. Do not use towel racks or bathroom tissue holders to help you stand.  * Lighting - Make sure halls, stairways, and entrances are well-lit. Install a night light in your bathroom or hallway. Make sure there is a light switch at the top and bottom of the staircase. Turn lights on if you get up in the middle of the night. Make sure lamps or light switches are within reach of the bed if you have to get up during the night.  * Kitchen - Install non-skid rubber mats near the sink and stove. Clean spills immediately. Store frequently used utensils, pots, and pans between waist and eye level. This helps prevent reaching and bending. Sit when getting things out of the lower cupboards.  * Living room / [place] furniture with wide spaces in between, giving enough room to move around. Establish a route through the living room that gives you something to hold onto as you walk.  * Stairs - Make sure treads, rails, and rugs are secure. Install a rail on both sides of the stairs. If stairs are a threat, it might be helpful to arrange most of your activities on the lower level to reduce the number of times you must climb the stairs.  * Entrances and doorways - Install metal handles on the walls adjacent to the doorknobs of all doors to make it more secure as you travel through the doorway.  Tips for maintaining balance:  * Keep at least one hand  free at all times Try using a backpack or fanny pack to hold things rather than carrying them in your hands. Never carry objects in both hands when walking as this interferes with keeping your balance.  * Attempt to swing both arms from front to back while walking. This might require a conscious effort if Parkinson's disease has diminished your movement. It will, however, help you to maintain balance and posture, and reduce fatigue.  * Consciously lift your feet off the ground when walking. Shuffling and dragging of the feet is a common culprit in losing your  balance.  * When trying to navigate turns, use a "U" technique of facing forward and making a wide turn, rather than pivoting sharply.  * Try to stand with your feet shoulder-length apart. When your feet are close together for any length of time, you increase your risk of losing your balance and falling.  * Do one thing at a time. Do not try to walk and accomplish another task, such as reading or looking around. The decrease in your automatic reflexes complicates motor function, so the less distraction, the better.  * Do not wear rubber or gripping soled shoes, they might "catch" on the floor and cause tripping.  * Move slowly when changing positions. Use deliberate, concentrated movements and, if needed, use a grab bar or walking aid. Count fifteen (15) seconds after standing to begin walking.  * If balance is a continuous problem, you might want to consider a walking aid such as a cane, walking stick, or walker. Once you have mastered walking with help, you may be ready to try it again on your own.  This information is provided by [facility] and is not intended to replace the medical advice of your physician or other health care providers. Please consult your physician or other health care providers for advice regarding your specific medical condition.

## 2016-12-11 NOTE — Telephone Encounter (Signed)
Patient came in today and I let him know that we sent out the VGo 40 kit paperwork to [facility] last week and again today.   Is he supposed to be switching to Omnipod?  Please advise.

## 2016-12-13 ENCOUNTER — Other Ambulatory Visit: Payer: Self-pay

## 2016-12-13 ENCOUNTER — Other Ambulatory Visit: Payer: Self-pay | Admitting: Endocrinology

## 2016-12-13 ENCOUNTER — Encounter: Payer: Self-pay | Admitting: Family Medicine

## 2016-12-13 ENCOUNTER — Ambulatory Visit (INDEPENDENT_AMBULATORY_CARE_PROVIDER_SITE_OTHER): Payer: Medicaid Other | Admitting: Family Medicine

## 2016-12-13 VITALS — BP 126/88 | Temp 98.3°F | Ht 68.5 in | Wt 251.0 lb

## 2016-12-13 DIAGNOSIS — G4730 Sleep apnea, unspecified: Secondary | ICD-10-CM

## 2016-12-13 DIAGNOSIS — E1165 Type 2 diabetes mellitus with hyperglycemia: Secondary | ICD-10-CM

## 2016-12-13 DIAGNOSIS — E1140 Type 2 diabetes mellitus with diabetic neuropathy, unspecified: Secondary | ICD-10-CM

## 2016-12-13 DIAGNOSIS — I10 Essential (primary) hypertension: Secondary | ICD-10-CM

## 2016-12-13 DIAGNOSIS — Z794 Long term (current) use of insulin: Secondary | ICD-10-CM

## 2016-12-13 DIAGNOSIS — G63 Polyneuropathy in diseases classified elsewhere: Secondary | ICD-10-CM | POA: Diagnosis not present

## 2016-12-13 MED ORDER — V-GO 40 KIT: PACK | 3 refills | Status: DC

## 2016-12-13 NOTE — Telephone Encounter (Signed)
Called patient and let him know the message and he wants to work on getting his blood sugars down and will call us on Monday to schedule a lab appointment first thing in the morning and fasting.  He stated that he has rotated his injection site and is working on getting his blood sugars lower.

## 2016-12-13 NOTE — Patient Instructions (Signed)
WE NOW OFFER   [facility] FAST TRACK!!!  SAME DAY Appointments for ACUTE CARE  Such as: Sprains, Injuries, cuts, abrasions, rashes, muscle pain, joint pain, back pain Colds, flu, sore throats, headache, allergies, cough, fever  Ear pain, sinus and eye infections Abdominal pain, nausea, vomiting, diarrhea, upset stomach Animal/insect bites  3 Easy Ways to Schedule: Walk-In Scheduling Call in scheduling Mychart Sign-up: [email or web address]         

## 2016-12-13 NOTE — Telephone Encounter (Signed)
Glucose and C-peptide ordered, he can come in any morning fasting but needs to have glucose of under 200

## 2016-12-13 NOTE — Progress Notes (Signed)
   Subjective:    Patient ID: ASIM GERSTEN, male    DOB: 1975/02/21, 41 y.o.   MRN: [id number]  HPI Here to follow up. He complains of feeling tired all the time and he cannot tell why. He gets plenty of sleep. His BP has been stable. His glucoses have been up and down, but nothing too extreme. His lab work has been stable. He has seen Dr. Posey Pronto, Dr. Lorrene Reid, and Dr. Percival Spanish recently. Dr. Percival Spanish ordered a 48 hour Holter monitor and Rod just turned this in yesterday to be interpreted. Of note his wife says he snores loudly at night and he is a very restless sleeper.    Review of Systems  Constitutional: Positive for fatigue.  Respiratory: Negative.   Cardiovascular: Negative.   Gastrointestinal: Negative.   Genitourinary: Negative.   Neurological: Negative.        Objective:   Physical Exam  Constitutional: He is oriented to person, place, and time. He appears well-developed and well-nourished.  Neck: No thyromegaly present.  Cardiovascular: Normal rate, regular rhythm, normal heart sounds and intact distal pulses.   Pulmonary/Chest: Effort normal and breath sounds normal. No respiratory distress. He has no wheezes. He has no rales.  Musculoskeletal: He exhibits no edema.  Lymphadenopathy:    He has no cervical adenopathy.  Neurological: He is alert and oriented to person, place, and time.          Assessment & Plan:  His diabetes and HTN and CKD all seem to be stable. We await the results of his Holter monitoring. There is a good chance he has sleep apnea, so we will set up an evaluation.  [name], MD

## 2016-12-13 NOTE — Progress Notes (Unsigned)
She is

## 2016-12-14 ENCOUNTER — Ambulatory Visit (INDEPENDENT_AMBULATORY_CARE_PROVIDER_SITE_OTHER): Payer: Medicaid Other | Admitting: Neurology

## 2016-12-14 DIAGNOSIS — E1142 Type 2 diabetes mellitus with diabetic polyneuropathy: Secondary | ICD-10-CM | POA: Diagnosis not present

## 2016-12-14 NOTE — Procedures (Signed)
[facility]  [place], [place]  [place], [place] [address] Tel: [phone] Fax:  [phone] Test Date:  12/14/2016  Patient: Danny Fuller DOB: [birth year] Physician: Narda Amber, DO  Sex: Male Height: 5\' 9"  Ref Phys: Narda Amber, D.O.  ID#: [id number] Temp: 36.2C Technician:    Patient Complaints: This is a 41 year old man with uncontrolled diabetes referred for evaluation of bilateral hand and lower extremity paresthesias, pain, and weakness.  NCV & EMG Findings: Extensive electrodiagnostic testing of the right upper and lower extremity shows: 1. Right median and ulnar sensory responses show prolonged latency (R3.7, R3.2 ms) and normal amplitude.  Right radial sensory responses within normal limits. 2. Right sural and superficial peroneal sensory responses are absent. 3. Right median motor response shows prolonged latency (4.0 ms) and normal amplitude. Right ulnar motor response shows prolonged latency (3.4 ms) and decreased conduction velocity (A Elbow-B Elbow, 36 m/s).  4. Right peroneal and tibial motor responses show reduced amplitude (R2.8, 3.9 mV).  There is mild tibial conduction velocity slowing (Knee-Ankle, 34 m/s).   5. Right tibial H reflex is prolonged. 6. Chronic motor axon loss changes are seen conforming to a gradient pattern in the lower extremity and distally in the right hand. There is no evidence of accompanied active denervation.   Impression: 1. 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  Overall, these findings are severe in degree electrically. 2. 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   ___________________________ Narda Amber, DO    Nerve Conduction Studies Anti Sensory Summary Table   Site NR Peak (ms) Norm Peak (ms) P-T Amp (V) Norm P-T Amp  Right Median Anti  Sensory (2nd Digit)  Wrist    3.7 <3.4 23.8 >20  Right Radial Anti Sensory (Base 1st Digit)  Wrist    2.6 <2.7 23.6 >18  Right Sup Peroneal Anti Sensory (Ant Lat Mall)  12 cm NR  <4.5  >5  Right Sural Anti Sensory (Lat Mall)  Calf NR  <4.5  >5  Right Ulnar Anti Sensory (5th Digit)  Wrist    3.2 <3.1 18.0 >12   Motor Summary Table   Site NR Onset (ms) Norm Onset (ms) O-P Amp (mV) Norm O-P Amp Site1 Site2 Delta-0 (ms) Dist (cm) Vel (m/s) Norm Vel (m/s)  Right Median Motor (Abd Poll Brev)  Wrist    4.0 <3.9 11.2 >6 Elbow Wrist 6.3 32.0 51 >50  Elbow    10.3  9.4         Right Peroneal Motor (Ext Dig Brev)  Ankle    4.4 <5.5 2.8 >3 B Fib Ankle 9.6 39.0 41 >40  B Fib    14.0  2.5  Poplt B Fib 1.6 10.0 63 >40  Poplt    15.6  2.4         Right Peroneal TA Motor (Tib Ant)  Fib Head    3.4 <4.0 4.2 >4 Poplit Fib Head 1.2 10.0 83 >40  Poplit    4.6  4.1         Right Tibial Motor (Abd Hall Brev)  Ankle    5.3 <6.0 3.9 >8 Knee Ankle 12.0 41.0 34 >40  Knee    17.3  2.6         Right Ulnar Motor (Abd Dig Minimi)  Wrist    3.4 <3.1 9.3 >7 B Elbow  Wrist 5.4 27.0 50 >50  B Elbow    8.8  7.8  A Elbow B Elbow 2.8 10.0 36 >50  A Elbow    11.6  6.7          H Reflex Studies   NR H-Lat (ms) Lat Norm (ms) L-R H-Lat (ms)  Right Tibial (Gastroc)     37.14 <35    EMG   Side Muscle Ins Act Fibs Psw Fasc Number Recrt Dur Dur. Amp Amp. Poly Poly. Comment  Right 1stDorInt Nml Nml Nml Nml 1- Rapid Few 1+ Few 1+ Nml Nml N/A  Right ABD Dig Min Nml Nml Nml Nml 1- Rapid Few 1+ Few 1+ Nml Nml N/A  Right Ext Indicis Nml Nml Nml Nml Nml Nml Nml Nml Nml Nml Nml Nml N/A  Right Abd Poll Brev Nml Nml Nml Nml 1- Rapid Some 1+ Some 1+ Nml Nml N/A  Right PronatorTeres Nml Nml Nml Nml Nml Nml Nml Nml Nml Nml Nml Nml N/A  Right Biceps Nml Nml Nml Nml Nml Nml Nml Nml Nml Nml Nml Nml N/A  Right Triceps Nml Nml Nml Nml Nml Nml Nml Nml Nml Nml Nml Nml N/A  Right Deltoid Nml Nml Nml Nml Nml Nml Nml Nml Nml Nml Nml Nml  N/A  Right Cervical Parasp Low Nml Nml Nml Nml Nml Nml Nml Nml Nml Nml Nml Nml N/A  Right AntTibialis Nml Nml Nml Nml 1- Rapid Some 1+ Some 1+ Nml Nml N/A  Right Gastroc Nml Nml Nml Nml 2- Rapid Some 1+ Some 1+ Nml Nml N/A  Right Flex Dig Long Nml Nml Nml Nml 2- Rapid Many 1+ Many 1+ Nml Nml N/A  Right RectFemoris Nml Nml Nml Nml Nml Nml Nml Nml Nml Nml Nml Nml N/A  Right GluteusMed Nml Nml Nml Nml Nml Nml Nml Nml Nml Nml Nml Nml N/A  Right BicepsFemS Nml Nml Nml Nml 1- Rapid Few 1+ Few 1+ Nml Nml N/A      Waveforms:

## 2016-12-15 ENCOUNTER — Telehealth: Payer: Self-pay | Admitting: *Deleted

## 2016-12-15 NOTE — Telephone Encounter (Signed)
-----   Message from Alda Berthold, DO sent at 12/15/2016  2:39 PM EDT ----- Please call and inform patient that his nerve testing shows neuropathy in the legs and hands.  There is also nerve impingement at the elbow, which may be due to positioning the arm in too much flexion.  Recommend that he try using a soft elbow pad to minimize compression of the nerve at the elbow and use it as a block during the night to prevent over bending.  If any worsening hand weakness, schedule sooner return visit. Thank.

## 2016-12-15 NOTE — Telephone Encounter (Signed)
Left message giving patient results and instructions.   

## 2016-12-17 ENCOUNTER — Encounter: Payer: Self-pay | Admitting: Podiatry

## 2016-12-17 LAB — PROTEIN ELECTROPHORESIS, SERUM
ALBUMIN ELP: 4.2 g/dL (ref 3.8–4.8)
Alpha 1: 0.3 g/dL (ref 0.2–0.3)
Alpha 2: 0.8 g/dL (ref 0.5–0.9)
BETA GLOBULIN: 0.4 g/dL (ref 0.4–0.6)
Beta 2: 0.5 g/dL (ref 0.2–0.5)
Gamma Globulin: 1.4 g/dL (ref 0.8–1.7)
Total Protein: 7.5 g/dL (ref 6.1–8.1)

## 2016-12-17 LAB — IMMUNOFIXATION ELECTROPHORESIS
IMMUNOGLOBULIN A: 233 mg/dL (ref 81–463)
IgG (Immunoglobin G), Serum: 1378 mg/dL (ref 694–1618)
IgM, Serum: 103 mg/dL (ref 48–271)
Immunofix Electr Int: NOT DETECTED

## 2016-12-17 LAB — VITAMIN B1: Vitamin B1 (Thiamine): 8 nmol/L (ref 8–30)

## 2016-12-17 LAB — COPPER, SERUM: COPPER: 121 ug/dL (ref 70–175)

## 2016-12-21 ENCOUNTER — Telehealth: Payer: Self-pay | Admitting: Endocrinology

## 2016-12-21 NOTE — Telephone Encounter (Signed)
Patient requests Jinny Blossom to call him re: [employer]

## 2016-12-22 NOTE — Telephone Encounter (Signed)
Left vm requesting patient call back so that I can assist him on the note below

## 2016-12-25 ENCOUNTER — Telehealth: Payer: Self-pay | Admitting: *Deleted

## 2016-12-25 DIAGNOSIS — R002 Palpitations: Secondary | ICD-10-CM

## 2016-12-25 NOTE — Telephone Encounter (Signed)
Spoke with pt about his holter monitor, advised pt he can wear an event monitor for 30 days or he can get a device call [employer] off of [place], pt stated he will wear an event monitor. Order place and send to scheduler to be schedule for monitor placement

## 2016-12-25 NOTE — Telephone Encounter (Signed)
-----   Message from Minus Breeding, MD sent at 12/17/2016  8:52 AM EST ----- There was rare ectopy.  I do not see a reason for his dizziness or lightheadedness.  If we did not capture one of these episodes he will need to wear an event monitor or get [employer].  Call Mr. Hopkinson with the results and send results to [name], MD

## 2016-12-28 ENCOUNTER — Encounter: Payer: Self-pay | Admitting: Podiatry

## 2016-12-29 ENCOUNTER — Ambulatory Visit (INDEPENDENT_AMBULATORY_CARE_PROVIDER_SITE_OTHER): Payer: Medicaid Other | Admitting: Podiatry

## 2016-12-29 ENCOUNTER — Ambulatory Visit (INDEPENDENT_AMBULATORY_CARE_PROVIDER_SITE_OTHER): Payer: Medicaid Other

## 2016-12-29 ENCOUNTER — Other Ambulatory Visit: Payer: Self-pay | Admitting: Podiatry

## 2016-12-29 ENCOUNTER — Encounter: Payer: Self-pay | Admitting: Podiatry

## 2016-12-29 DIAGNOSIS — M7752 Other enthesopathy of left foot: Secondary | ICD-10-CM

## 2016-12-29 DIAGNOSIS — E1149 Type 2 diabetes mellitus with other diabetic neurological complication: Secondary | ICD-10-CM

## 2016-12-29 DIAGNOSIS — L84 Corns and callosities: Secondary | ICD-10-CM | POA: Diagnosis not present

## 2016-12-29 DIAGNOSIS — E1140 Type 2 diabetes mellitus with diabetic neuropathy, unspecified: Secondary | ICD-10-CM

## 2016-12-29 DIAGNOSIS — S98139A Complete traumatic amputation of one unspecified lesser toe, initial encounter: Secondary | ICD-10-CM

## 2016-12-29 DIAGNOSIS — M779 Enthesopathy, unspecified: Secondary | ICD-10-CM

## 2016-12-29 DIAGNOSIS — M79672 Pain in left foot: Secondary | ICD-10-CM

## 2016-12-29 DIAGNOSIS — G629 Polyneuropathy, unspecified: Secondary | ICD-10-CM

## 2016-12-29 MED ORDER — TRIAMCINOLONE ACETONIDE 10 MG/ML IJ SUSP
10.0000 mg | Freq: Once | INTRAMUSCULAR | Status: AC
  Administered 2016-12-29: 10 mg

## 2017-01-03 NOTE — Progress Notes (Signed)
Subjective:    Patient ID: Danny Fuller, male   DOB: 41 y.o.   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 and is developed quite a bit of inflammation fifth metatarsal base left that's very painful when pressed    ROS      Objective:  Physical Exam poorly controlled diabetic with neuropathic like symptoms with patient noted to have previous amputation with inflammation base of fifth metatarsal left and chronic keratotic lesion formation     Assessment:    At risk diabetic with lesions and inflammation fifth MPJ base     Plan:     Careful injection administered fifth MPJ base 3 mg Dexon some Kenalog 5 mill grams Xylocaine and advised on watching her sugar closely and I debrided lesions bilateral with no iatrogenic bleeding noted

## 2017-01-09 ENCOUNTER — Other Ambulatory Visit: Payer: Medicaid Other

## 2017-01-09 ENCOUNTER — Ambulatory Visit (INDEPENDENT_AMBULATORY_CARE_PROVIDER_SITE_OTHER): Payer: Medicaid Other

## 2017-01-09 DIAGNOSIS — R002 Palpitations: Secondary | ICD-10-CM | POA: Diagnosis not present

## 2017-01-10 ENCOUNTER — Emergency Department (HOSPITAL_COMMUNITY): Payer: Medicaid Other

## 2017-01-10 ENCOUNTER — Other Ambulatory Visit (INDEPENDENT_AMBULATORY_CARE_PROVIDER_SITE_OTHER): Payer: Medicaid Other

## 2017-01-10 ENCOUNTER — Other Ambulatory Visit: Payer: Self-pay

## 2017-01-10 ENCOUNTER — Encounter (HOSPITAL_COMMUNITY): Payer: Self-pay | Admitting: *Deleted

## 2017-01-10 DIAGNOSIS — Z794 Long term (current) use of insulin: Secondary | ICD-10-CM | POA: Diagnosis not present

## 2017-01-10 DIAGNOSIS — R079 Chest pain, unspecified: Secondary | ICD-10-CM | POA: Diagnosis present

## 2017-01-10 DIAGNOSIS — Z7984 Long term (current) use of oral hypoglycemic drugs: Secondary | ICD-10-CM | POA: Diagnosis not present

## 2017-01-10 DIAGNOSIS — I129 Hypertensive chronic kidney disease with stage 1 through stage 4 chronic kidney disease, or unspecified chronic kidney disease: Secondary | ICD-10-CM | POA: Insufficient documentation

## 2017-01-10 DIAGNOSIS — R1031 Right lower quadrant pain: Secondary | ICD-10-CM | POA: Diagnosis not present

## 2017-01-10 DIAGNOSIS — E1165 Type 2 diabetes mellitus with hyperglycemia: Secondary | ICD-10-CM

## 2017-01-10 DIAGNOSIS — Z79899 Other long term (current) drug therapy: Secondary | ICD-10-CM | POA: Insufficient documentation

## 2017-01-10 DIAGNOSIS — M545 Low back pain: Secondary | ICD-10-CM | POA: Insufficient documentation

## 2017-01-10 DIAGNOSIS — Z87891 Personal history of nicotine dependence: Secondary | ICD-10-CM | POA: Insufficient documentation

## 2017-01-10 DIAGNOSIS — J45909 Unspecified asthma, uncomplicated: Secondary | ICD-10-CM | POA: Diagnosis not present

## 2017-01-10 DIAGNOSIS — E1122 Type 2 diabetes mellitus with diabetic chronic kidney disease: Secondary | ICD-10-CM | POA: Diagnosis not present

## 2017-01-10 DIAGNOSIS — N189 Chronic kidney disease, unspecified: Secondary | ICD-10-CM | POA: Diagnosis not present

## 2017-01-10 DIAGNOSIS — G8929 Other chronic pain: Secondary | ICD-10-CM | POA: Diagnosis not present

## 2017-01-10 LAB — CBC
HEMATOCRIT: 42.3 % (ref 39.0–52.0)
Hemoglobin: 14.2 g/dL (ref 13.0–17.0)
MCH: 29.6 pg (ref 26.0–34.0)
MCHC: 33.6 g/dL (ref 30.0–36.0)
MCV: 88.3 fL (ref 78.0–100.0)
Platelets: 309 10*3/uL (ref 150–400)
RBC: 4.79 MIL/uL (ref 4.22–5.81)
RDW: 12.9 % (ref 11.5–15.5)
WBC: 9 10*3/uL (ref 4.0–10.5)

## 2017-01-10 LAB — BASIC METABOLIC PANEL
Anion gap: 8 (ref 5–15)
BUN: 10 mg/dL (ref 6–20)
CHLORIDE: 106 mmol/L (ref 101–111)
CO2: 24 mmol/L (ref 22–32)
Calcium: 9 mg/dL (ref 8.9–10.3)
Creatinine, Ser: 0.93 mg/dL (ref 0.61–1.24)
GFR calc Af Amer: >60 mL/min (ref >60)
GLUCOSE: 199 mg/dL — AB (ref 65–99)
POTASSIUM: 3.8 mmol/L (ref 3.5–5.1)
SODIUM: 138 mmol/L (ref 135–145)

## 2017-01-10 LAB — COMPREHENSIVE METABOLIC PANEL
ALBUMIN: 3.6 g/dL (ref 3.5–5.2)
ALT: 16 U/L (ref 0–53)
AST: 18 U/L (ref 0–37)
Alkaline Phosphatase: 78 U/L (ref 39–117)
BUN: 15 mg/dL (ref 6–23)
CHLORIDE: 104 meq/L (ref 96–112)
CO2: 28 mEq/L (ref 19–32)
CREATININE: 0.96 mg/dL (ref 0.40–1.50)
Calcium: 9.1 mg/dL (ref 8.4–10.5)
GFR: 111.86 mL/min (ref >60.00)
Glucose, Bld: 210 mg/dL — ABNORMAL HIGH (ref 70–99)
Potassium: 4.1 mEq/L (ref 3.5–5.1)
SODIUM: 137 meq/L (ref 135–145)
TOTAL PROTEIN: 6.7 g/dL (ref 6.0–8.3)
Total Bilirubin: 0.5 mg/dL (ref 0.2–1.2)

## 2017-01-10 LAB — TROPONIN I: Troponin I: <0.03 ng/mL (ref <0.03)

## 2017-01-10 LAB — GLUCOSE, RANDOM: Glucose, Bld: 210 mg/dL — ABNORMAL HIGH (ref 70–99)

## 2017-01-10 NOTE — ED Triage Notes (Signed)
Chest pain back pain dizziness for 2-3 days he is currently on a hart monitor for the dizziness that he has had for 1-2 months.  His bp has been high also.  He has been taking his bop meds

## 2017-01-11 ENCOUNTER — Emergency Department (HOSPITAL_COMMUNITY)
Admission: EM | Admit: 2017-01-11 | Discharge: 2017-01-11 | Disposition: A | Discharge status: Home / Self Care | Payer: Medicaid Other | Attending: Emergency Medicine | Admitting: Emergency Medicine

## 2017-01-11 ENCOUNTER — Emergency Department (HOSPITAL_COMMUNITY): Payer: Medicaid Other

## 2017-01-11 ENCOUNTER — Encounter (HOSPITAL_COMMUNITY): Payer: Self-pay | Admitting: Emergency Medicine

## 2017-01-11 ENCOUNTER — Other Ambulatory Visit: Payer: Medicaid Other | Admitting: Orthotics

## 2017-01-11 DIAGNOSIS — R079 Chest pain, unspecified: Secondary | ICD-10-CM

## 2017-01-11 DIAGNOSIS — M545 Low back pain, unspecified: Secondary | ICD-10-CM

## 2017-01-11 DIAGNOSIS — G8929 Other chronic pain: Secondary | ICD-10-CM

## 2017-01-11 LAB — URINALYSIS, ROUTINE W REFLEX MICROSCOPIC
Bilirubin Urine: NEGATIVE
GLUCOSE, UA: 150 mg/dL — AB
HGB URINE DIPSTICK: NEGATIVE
KETONES UR: NEGATIVE mg/dL
LEUKOCYTES UA: NEGATIVE
NITRITE: NEGATIVE
PROTEIN: 100 mg/dL — AB
Specific Gravity, Urine: 1.027 (ref 1.005–1.030)
pH: 6 (ref 5.0–8.0)

## 2017-01-11 LAB — C-PEPTIDE: C-Peptide: 2.5 ng/mL (ref 1.1–4.4)

## 2017-01-11 LAB — FRUCTOSAMINE: Fructosamine: 401 umol/L — ABNORMAL HIGH (ref 0–285)

## 2017-01-11 LAB — I-STAT TROPONIN, ED: TROPONIN I, POC: 0 ng/mL (ref 0.00–0.08)

## 2017-01-11 MED ORDER — HYDROMORPHONE HCL 1 MG/ML IJ SOLN
1.0000 mg | Freq: Once | INTRAMUSCULAR | Status: AC
  Administered 2017-01-11: 1 mg via INTRAMUSCULAR
  Filled 2017-01-11: qty 1

## 2017-01-11 MED ORDER — HYDRALAZINE HCL 25 MG PO TABS
50.0000 mg | ORAL_TABLET | Freq: Once | ORAL | Status: AC
  Administered 2017-01-11: 50 mg via ORAL
  Filled 2017-01-11: qty 2

## 2017-01-11 MED ORDER — METHOCARBAMOL 500 MG PO TABS: 500.0000 mg | ORAL_TABLET | Freq: Two times a day (BID) | ORAL | 0 refills | Status: DC

## 2017-01-11 MED ORDER — METHOCARBAMOL 500 MG PO TABS
500.0000 mg | ORAL_TABLET | Freq: Once | ORAL | Status: AC
  Administered 2017-01-11: 500 mg via ORAL
  Filled 2017-01-11: qty 1

## 2017-01-11 MED ORDER — OXYCODONE-ACETAMINOPHEN 5-325 MG PO TABS
2.0000 | ORAL_TABLET | Freq: Once | ORAL | Status: AC
  Administered 2017-01-11: 2 via ORAL
  Filled 2017-01-11: qty 2

## 2017-01-11 NOTE — ED Notes (Signed)
Patient transported to CT 

## 2017-01-11 NOTE — ED Notes (Signed)
UA sent to lab

## 2017-01-11 NOTE — Discharge Instructions (Signed)
There is no evidence of urinary tract infection or kidney stone.  Take the pain medication as prescribed.  Follow-up with your doctor.  Return to the ED if you develop new or worsening symptoms.

## 2017-01-11 NOTE — ED Notes (Addendum)
Pt now c/o lower right abd pain that just started. 6/10.

## 2017-01-11 NOTE — ED Notes (Addendum)
Orthostatics completed on right arm.

## 2017-01-11 NOTE — ED Notes (Signed)
Delay in lab draw,  Pt not in room at this time. 

## 2017-01-11 NOTE — ED Provider Notes (Signed)
7:29 AM Patient signed out to be my [place] PA-C at shift change, pending CT. Ultimately discharged by Dr. Wyvonnia Dusky who was also involved in patient's care. I did not see patient.      Carlisle Cater, PA-C 01/11/17 0730    Ezequiel Essex, MD 01/11/17 [phone]

## 2017-01-11 NOTE — ED Provider Notes (Signed)
[facility] EMERGENCY DEPARTMENT Provider Note   CSN: [id number] Arrival date & time: 01/10/17  1956     History   Chief Complaint Chief Complaint  Patient presents with  . Chest Pain  . Back Pain    HPI Danny Fuller is a 41 y.o. male with a hx of asthma, CKD, IDDM, GERD, HTN, PVD, high cholesterol presents to the Emergency Department complaining of gradual, persistent, progressively worsening left sided chest pain, lightheadedness and dizziness onset 8 mos ago.  Pt reports his pain has been intermittent since that time.  Pt is followed by [facility] for this.  He was placed on a holter monitor for 1 month which was placed on Monday (3 days ago).  Pt reports he developed chest pain last night that intensified and has been persistent.  He reports right thoracic and lumbar back pain since Saturday.  He reports he has never had pain like this before.  Pt reports laying on his right side makes the pain worse.  He also reports laying flat and movement makes the pain worse.  He reports taking meloxicam, lyrica and tramadol without relief. Pt reports he came tonight because he didn't think he could take the pain anymore.  Pt also reports generalized weakness today.  He denies fever, chills, headache, neck pain, abd pain, N/V/D, syncope, dysuria.    The history is provided by medical records and the patient. No language interpreter was used.    Past Medical History:  Diagnosis Date  . Asthma   . Chronic kidney disease    sees Dr. Jamal Maes    . Diabetes mellitus    sees Dr. Dwyane Dee   . GERD (gastroesophageal reflux disease)   . Headache(784.0)   . Hyperlipidemia   . Hypertension     Patient Active Problem List   Diagnosis Date Noted  . PVD (peripheral vascular disease) ([place]) 11/28/2016  . Palpitation 11/28/2016  . Erectile dysfunction associated with type 2 diabetes mellitus ([place]) 05/08/2016  . Poorly controlled diabetes mellitus ([place]) 03/30/2016  . Necrotizing  fasciitis ([place]) 03/07/2016  . Pyelonephritis 03/04/2016  . Acquired contracture of Achilles tendon, left 02/15/2016  . Arthralgia of multiple joints 01/19/2016  . Diabetic polyneuropathy associated with type 2 diabetes mellitus ([place]) 01/18/2016  . Acquired absence of other left toe(s) ([place]) 01/18/2016  . Foot amputation status, left ([place]) 01/04/2016  . Peripheral neuropathy 11/11/2015  . Asthma 07/23/2015  . Chronic headache 07/23/2015  . Erectile disorder due to medical condition in male 04/23/2015  . BMI 33.0-33.9,adult 04/21/2012  . GERD 04/21/2008  . Type 2 diabetes mellitus with diabetic neuropathy, with long-term current use of insulin ([place]) 11/21/2006  . Essential hypertension 11/21/2006  . LUMBAR STRAIN 11/21/2006    Past Surgical History:  Procedure Laterality Date  . AMPUTATION Left 07/26/2015   Procedure: AMPUTATION LEFT FIFTH RAY;  Surgeon: [name], MD;  Location: [facility];  Service: Orthopedics;  Laterality: Left;  . bilateral hip pins placed    . INCISION AND DRAINAGE PERIRECTAL ABSCESS Right 03/07/2016   Procedure: IRRIGATION AND DEBRIDEMENT PERIRECTAL ABSCESS;  Surgeon: Alphonsa Overall, MD;  Location: [facility];  Service: General;  Laterality: Right;  . INCISION AND DRAINAGE PERIRECTAL ABSCESS Right 03/09/2016   Procedure: EXAM UNDER ANESTHESIA, IRRIGATION AND DEBRIDEMENT PERIRECTAL ABSCESS;  Surgeon: Johnathan Hausen, MD;  Location: [facility];  Service: General;  Laterality: Right;  Open, betadine packed wound       Home Medications    Prior to  Admission medications   Medication Sig Start Date End Date Taking? Authorizing Provider  amLODipine-benazepril (LOTREL) 10-40 MG capsule Take 1 capsule by mouth daily. 05/29/16  Yes [name], MD  cloNIDine (CATAPRES - DOSED IN MG/24 HR) 0.1 mg/24hr patch apply 1 patch every week 10/05/16  Yes [name], MD  empagliflozin (JARDIANCE) 25 MG TABS tablet Take 25 mg by mouth daily. 09/04/16  Yes [name], MD  hydrALAZINE  (APRESOLINE) 25 MG tablet Take 1 tablet (25 mg total) by mouth every 8 (eight) hours. 03/27/16  Yes [name], MD  Insulin Disposable Pump (V-GO 40) KIT Use one pump every 24 hours 12/13/16  Yes [name], MD  meloxicam (MOBIC) 15 MG tablet Take 1 tablet (15 mg total) by mouth daily. 08/03/16  Yes [name], MD  metFORMIN (GLUCOPHAGE-XR) 500 MG 24 hr tablet Take 4 tablets (2,000 mg total) by mouth daily with supper. 03/28/16  Yes [name], MD  pravastatin (PRAVACHOL) 40 MG tablet Take 1 tablet (40 mg total) by mouth every evening. 11/27/16 02/25/17 Yes Hochrein, Jeneen Rinks, MD  pregabalin (LYRICA) 150 MG capsule Take 150-200 mg by mouth See admin instructions. Take 1 capsule every morning and take 2 capsules every evening   Yes [provider]  traMADol (ULTRAM) 50 MG tablet take 2 tablets by mouth every 6 hours if needed for pain 08/25/16  Yes [name], MD  cloNIDine (CATAPRES - DOSED IN MG/24 HR) 0.1 mg/24hr patch Place 1 patch (0.1 mg total) onto the skin once a week. Patient not taking: Reported on 12/13/2016 12/11/16   [name], MD  glucose blood (ACCU-CHEK AVIVA) test strip Use to test blood sugar 1 time daily 09/22/16   [name], MD  insulin aspart (NOVOLOG) 100 UNIT/ML FlexPen Inject 66 Units into the skin 3 (three) times daily before meals. Patient not taking: Reported on 01/11/2017 09/14/16   [name], MD  Lancet Devices [facility]) lancets Use as instructed 08/10/16   [name], MD  methocarbamol (ROBAXIN) 500 MG tablet Take 1 tablet (500 mg total) by mouth 2 (two) times daily. 01/11/17   Tel Hevia, Jarrett Soho, PA-C  NOVOLOG 100 UNIT/ML injection INJECT 66 UNITS INTO THE SKIN 3 TIMES DAILY BEFORE MEALS Patient not taking: Reported on 12/13/2016 09/14/16   [name], MD  pregabalin (LYRICA) 100 MG capsule Take 1 tablet in the morning, 1 tablet in the afternoon, and 2 tablets at bedtime 12/11/16   Alda [place], DO    Family History Family History    Problem Relation Age of Onset  . Arthritis Unknown   . Diabetes Unknown   . Hypertension Unknown   . Hyperlipidemia Unknown   . Stroke Unknown   . Sudden death Unknown   . Diabetes Mother   . Diabetes Sister   . Diabetes Brother   . Heart attack Father        Died ate 85, couple of "heart attacks"     Social History Social History   Tobacco Use  . Smoking status: Former Smoker    Packs/day: 0.50    Years: 20.00    Pack years: 10.00    Types: Cigarettes    Last attempt to quit: 07/23/2015    Years since quitting: 1.4  . Smokeless tobacco: Never Used  Substance Use Topics  . Alcohol use: No    Alcohol/week: 0.0 oz  . Drug use: No     Allergies   Patient has no known allergies.  Review of Systems Review of Systems  Constitutional: Negative for appetite change, diaphoresis, fatigue, fever and unexpected weight change.  HENT: Negative for mouth sores.   Eyes: Negative for visual disturbance.  Respiratory: Negative for cough, chest tightness, shortness of breath and wheezing.   Cardiovascular: Positive for chest pain.  Gastrointestinal: Negative for abdominal pain, constipation, diarrhea, nausea and vomiting.  Endocrine: Negative for polydipsia, polyphagia and polyuria.  Genitourinary: Negative for dysuria, frequency, hematuria and urgency.  Musculoskeletal: Positive for back pain. Negative for neck stiffness.  Skin: Negative for rash.  Allergic/Immunologic: Negative for immunocompromised state.  Neurological: Positive for dizziness, weakness and light-headedness. Negative for syncope and headaches.  Hematological: Does not bruise/bleed easily.  Psychiatric/Behavioral: Negative for sleep disturbance. The patient is not nervous/anxious.      Physical Exam Updated Vital Signs BP (!) 218/117 (BP Location: Right Arm)   Pulse 87   Temp 98.4 F (36.9 C) (Oral)   Resp 20   Ht 5' 9"  (1.753 m)   Wt 117 kg (258 lb)   SpO2 100%   BMI 38.10 kg/m   Physical Exam   Constitutional: He appears well-developed and well-nourished. No distress.  Awake, alert, nontoxic appearance  HENT:  Head: Normocephalic and atraumatic.  Mouth/Throat: Oropharynx is clear and moist. No oropharyngeal exudate.  Eyes: Conjunctivae are normal. No scleral icterus.  Neck: Normal range of motion. Neck supple.  Full ROM without pain  Cardiovascular: Normal rate, regular rhythm and intact distal pulses.  Pulmonary/Chest: Effort normal and breath sounds normal. No respiratory distress. He has no wheezes.  Equal chest expansion  Abdominal: Soft. Bowel sounds are normal. He exhibits no distension and no mass. There is no tenderness. There is no rigidity, no rebound, no guarding and no CVA tenderness.  Musculoskeletal: Normal range of motion. He exhibits no edema.  Full range of motion of the T-spine and L-spine No midline tenderness to the  T-spine or L-spine Tenderness to palpation of the right paraspinous muscles of the T-spine and L-spine   Lymphadenopathy:    He has no cervical adenopathy.  Neurological: He is alert.  Speech is clear and goal oriented, follows commands Normal 5/5 strength in upper and lower extremities bilaterally including dorsiflexion and plantar flexion, strong and equal grip strength Sensation normal to light and sharp touch Moves extremities without ataxia, coordination intact Antalgic gait, normal balance  Normal balance No Clonus  Skin: Skin is warm and dry. No rash noted. He is not diaphoretic. No erythema.  Palpation of the back and buttocks are without crepitus, erythema, increased warmth or signs of infection.  Psychiatric: He has a normal mood and affect. His behavior is normal.  Nursing note and vitals reviewed.    ED Treatments / Results  Labs (all labs ordered are listed, but only abnormal results are displayed) Labs Reviewed  BASIC METABOLIC PANEL - Abnormal; Notable for the following components:      Result Value   Glucose, Bld 199  (*)    All other components within normal limits  URINALYSIS, ROUTINE W REFLEX MICROSCOPIC - Abnormal; Notable for the following components:   Glucose, UA 150 (*)    Protein, ur 100 (*)    Bacteria, UA RARE (*)    Squamous Epithelial / LPF 0-5 (*)    All other components within normal limits  CBC  TROPONIN I  I-STAT TROPONIN, ED    EKG  EKG Interpretation  Date/Time:  Wednesday January 10 2017 20:07:30 EST Ventricular Rate:  89 PR Interval:  170 QRS Duration: 80 QT Interval:  350 QTC Calculation: 425 R Axis:   122 Text Interpretation:  Sinus rhythm with Premature atrial complexes Left posterior fascicular block Abnormal ECG No significant change was found Confirmed by Ezequiel Essex [phone]) on 01/11/2017 1:49:40 AM       Radiology Dg Chest 2 View  Result Date: 01/10/2017 CLINICAL DATA:  Chest and back pain for 2-3 days EXAM: CHEST  2 VIEW COMPARISON:  Chest radiograph 03/04/2016 FINDINGS: Shallow lung inflation. Opacity of the left lung base is probably atelectasis. No other focal opacity. No pulmonary edema, pleural effusion or pneumothorax. IMPRESSION: Shallow lung inflation with left basilar opacity, probably atelectasis. If there are symptoms of infection, a pneumonia would be a secondary consideration. Electronically Signed   By: Ulyses Jarred M.D.   On: 01/10/2017 21:08    Procedures Procedures (including critical care time)  Medications Ordered in ED Medications  HYDROmorphone (DILAUDID) injection 1 mg (not administered)  methocarbamol (ROBAXIN) tablet 500 mg (500 mg Oral Given 01/11/17 0245)  oxyCODONE-acetaminophen (PERCOCET/ROXICET) 5-325 MG per tablet 2 tablet (2 tablets Oral Given 01/11/17 0245)  hydrALAZINE (APRESOLINE) tablet 50 mg (50 mg Oral Given 01/11/17 0244)     Initial Impression / Assessment and Plan / ED Course  I have reviewed the triage vital signs and the nursing notes.  Pertinent labs & imaging results that were available during my care  of the patient were reviewed by me and considered in my medical decision making (see chart for details).  Clinical Course as of Jan 12 624  Thu Jan 11, 2017  0321 Repeat troponin negative Troponin i, poc: 0.00 [HM]  0600 Pt continues to have severe pain. Will obtain CT renal and give additional pain control.    [HM]    Clinical Course User Index [HM] Gionni Freese, Gwenlyn Perking    Presents with chronic chest pain and new onset back pain.  EKG reassuring and troponin x2 is negative.  Doubt ACS.  Patient is currently followed by cardiology and wearing a Holter monitor.  He will need to follow-up with them for further evaluation.  Patient's back pain is reproducible with palpation and movement.  No loss of bowel or bladder control.  Patient has neuropathy of the bilateral lower extremities reports no worsening in numbness or tingling.  He has antalgic but otherwise normal gait within the room.  Lower extremity strength is 5/5.  No evidence of cauda equina.  No falls or trauma.  No history of IV drug use, fevers, personal history of cancer.  Urinalysis without evidence of urinary tract infection.  No hemoglobin to suggest renal stone however patient's pain continues to be reported as severe though he is resting comfortably in the bed.  Will obtain CT renal to rule out nephrolithiasis or subacute pyelonephritis.  The patient was discussed with and seen by Dr. Wyvonnia Dusky who agrees with the treatment plan.  At shift change care was transferred to [facility], PA-C who will follow CT scan results, reassess vitals and determine disposition.     Final Clinical Impressions(s) / ED Diagnoses   Final diagnoses:  Chronic chest pain  Acute right-sided low back pain without sciatica    ED Discharge Orders        Ordered    methocarbamol (ROBAXIN) 500 MG tablet  2 times daily     01/11/17 0608       Liesel Peckenpaugh, Jarrett Soho, PA-C 01/11/17 6213    Ezequiel Essex, MD 01/11/17 [phone]

## 2017-01-12 ENCOUNTER — Other Ambulatory Visit: Payer: Self-pay

## 2017-01-12 ENCOUNTER — Other Ambulatory Visit: Payer: Self-pay | Admitting: Endocrinology

## 2017-01-12 ENCOUNTER — Other Ambulatory Visit (INDEPENDENT_AMBULATORY_CARE_PROVIDER_SITE_OTHER): Payer: Medicaid Other

## 2017-01-12 ENCOUNTER — Ambulatory Visit (INDEPENDENT_AMBULATORY_CARE_PROVIDER_SITE_OTHER): Payer: Medicaid Other | Admitting: Endocrinology

## 2017-01-12 ENCOUNTER — Telehealth: Payer: Self-pay | Admitting: Endocrinology

## 2017-01-12 ENCOUNTER — Ambulatory Visit: Payer: Medicaid Other | Admitting: Cardiology

## 2017-01-12 ENCOUNTER — Encounter: Payer: Self-pay | Admitting: Endocrinology

## 2017-01-12 VITALS — BP 155/100 | HR 95 | Ht 68.5 in | Wt 269.2 lb

## 2017-01-12 DIAGNOSIS — Z794 Long term (current) use of insulin: Secondary | ICD-10-CM

## 2017-01-12 DIAGNOSIS — E1165 Type 2 diabetes mellitus with hyperglycemia: Secondary | ICD-10-CM

## 2017-01-12 DIAGNOSIS — I10 Essential (primary) hypertension: Secondary | ICD-10-CM

## 2017-01-12 LAB — GLUCOSE, RANDOM: GLUCOSE: 110 mg/dL — AB (ref 70–99)

## 2017-01-12 MED ORDER — CHLORTHALIDONE 25 MG PO TABS: 25.0000 mg | ORAL_TABLET | Freq: Every day | ORAL | 1 refills | Status: DC

## 2017-01-12 MED ORDER — INSULIN ASPART 100 UNIT/ML FLEXPEN: PEN_INJECTOR | SUBCUTANEOUS | 5 refills | Status: DC

## 2017-01-12 NOTE — Telephone Encounter (Signed)
Danny Fuller at [facility] says she received from [facility] a request but she has not received the actual script for VGO. Please call if we still need them.

## 2017-01-12 NOTE — Telephone Encounter (Signed)
Krystal Eaton and she stated that the patient stated that he is on the Omnipod and they do not carry the pods. In case he needs the VGo kits we can fax to [facility] at [phone].

## 2017-01-12 NOTE — Progress Notes (Signed)
Patient ID: Danny Fuller, male   DOB: September 06, 1975, 41 y.o.   MRN: [id number]            Reason for Appointment: Follow-up for Type 2 Diabetes and other issues  Referring physician: Sarajane Jews   History of Present Illness:          Date of diagnosis of type 2 diabetes mellitus: 2007        Background history:   At diagnosis he was relatively asymptomatic and was started on metformin and glipizide He has been on the same oral hypoglycemic regimen for several years. Review of his A1c indicates it has been markedly increased persistently with the lowest reading in 2015 being 9.1  Recent history:   INSULIN regimen is:  OMNIPOD pump since 01/05/17  BASAL rate 12 AM = 3.0, 4 PM = 2.0, 8 PM = 4.0, total 72 units per day Carbohydrate ratio 1:10, sensitivity 1:15 and target 120 Active insulin 3 hours  Previously V-go pump with basal 40 units and boluses 6 units at meals       Non-insulin hypoglycemic drugs the patient is taking are: Metformin ER 2000 mg daily, Jardiance 25 mg daily  His A1c is most recently 10.8%, previously 8.4% Fructosamine is 401  Current management, blood sugar patterns and problems identified:  He was switched to the Omnipod pump but he did not notify us about getting this pump and programmed the pump and started using this on his own over a week ago  Previous blood sugar records are not available but he is manually entering his Accu-Chek blood sugars in the new pump  FASTING blood sugars have been fairly good and mostly near normal now, he does not think he has had overnight hypoglycemia even with using a basal rate of 3.0 overnight  Blood sugars rest of the head they have been somewhat erratically and mostly high and not clear why  He says his pump will occasionally give him alarms and occasionally has had to suspend his pump in the mornings but he has not call the company for support  HIGHEST blood sugars appear to be late in the evening even without any  documented carbohydrate intake  He says he is able to enter his carbohydrates on his own and is able to look them up  POSTPRANDIAL readings are difficult to assess in the evening as he does not check after evening meal usually  He says he is continuing to take his Jardiance  However appears to have gained significant amount of weight; he says that he is not eating out a lot and trying to eat healthy, his carbohydrate intake on any given day is reportedly no more than 140 g per day        Side effects from medications have been: None  Compliance with the medical regimen: Improving Hypoglycemia:   none  Glucose monitoring:  was on freestyle Libre sensor, currently using Accu-Chek  Blood Glucose readings by   Mean values apply above for all meters except median for One Touch  PRE-MEAL Fasting Lunch Dinner Bedtime Overall  Glucose range:  99-1 34    1 74-328     Mean/median:     214     Self-care: Meal times are:  Breakfast is at 9-10 am, Lunch-1 PM : Dinner: 8 pm   Typical meal intake: Breakfast is Usually toast and boiled eggs.  Usually getting cold cut sandwiches at lunch, grilled chicken and vegetables/potatoes at dinner.  Snacks are usually  chips, crackers                Dietician visit, most recent: 05/02/16               Exercise:  some walking, limited by pain  Weight history: His highest weight in the past has been 378    Wt Readings from Last 3 Encounters:  01/12/17 269 lb 3.2 oz (122.1 kg)  01/10/17 258 lb (117 kg)  12/13/16 251 lb (113.9 kg)    Glycemic control:   Lab Results  Component Value Date   HGBA1C 10.8 (H) 11/24/2016   HGBA1C 8.4 (H) 06/19/2016   HGBA1C 11.6 (H) 03/06/2016   Lab Results  Component Value Date   MICROALBUR 11.8 (H) 03/23/2016   LDLCALC 51 03/23/2016   CREATININE 0.93 01/10/2017   Lab Results  Component Value Date   MICRALBCREAT 7.2 03/23/2016    Lab Results  Component Value Date   FRUCTOSAMINE 401 (H) 01/10/2017    FRUCTOSAMINE 305 (H) 09/01/2016   FRUCTOSAMINE 334 (H) 07/27/2016      Allergies as of 01/12/2017   No Known Allergies     Medication List        Accurate as of 01/12/17 11:49 AM. Always use your most recent med list.          accu-chek softclix lancets Use as instructed   amLODipine-benazepril 10-40 MG capsule Commonly known as:  LOTREL Take 1 capsule by mouth daily.   cloNIDine 0.1 mg/24hr patch Commonly known as:  CATAPRES - Dosed in mg/24 hr apply 1 patch every week   empagliflozin 25 MG Tabs tablet Commonly known as:  JARDIANCE Take 25 mg by mouth daily.   glucose blood test strip Commonly known as:  ACCU-CHEK AVIVA Use to test blood sugar 1 time daily   hydrALAZINE 25 MG tablet Commonly known as:  APRESOLINE Take 1 tablet (25 mg total) by mouth every 8 (eight) hours.   insulin aspart 100 UNIT/ML FlexPen Commonly known as:  NOVOLOG Inject 66 Units into the skin 3 (three) times daily before meals.   meloxicam 15 MG tablet Commonly known as:  MOBIC Take 1 tablet (15 mg total) by mouth daily.   metFORMIN 500 MG 24 hr tablet Commonly known as:  GLUCOPHAGE-XR Take 4 tablets (2,000 mg total) by mouth daily with supper.   methocarbamol 500 MG tablet Commonly known as:  ROBAXIN Take 1 tablet (500 mg total) by mouth 2 (two) times daily.   pravastatin 40 MG tablet Commonly known as:  PRAVACHOL Take 1 tablet (40 mg total) by mouth every evening.   pregabalin 150 MG capsule Commonly known as:  LYRICA Take 150-200 mg by mouth See admin instructions. Take 1 capsule every morning and take 2 capsules every evening   pregabalin 100 MG capsule Commonly known as:  LYRICA Take 1 tablet in the morning, 1 tablet in the afternoon, and 2 tablets at bedtime   traMADol 50 MG tablet Commonly known as:  ULTRAM take 2 tablets by mouth every 6 hours if needed for pain   V-GO 40 Kit Use one pump every 24 hours       Allergies: No Known Allergies  Past Medical  History:  Diagnosis Date  . Asthma   . Chronic kidney disease    sees Dr. Jamal Maes    . Diabetes mellitus    sees Dr. Dwyane Dee   . GERD (gastroesophageal reflux disease)   . Headache(784.0)   . Hyperlipidemia   .  Hypertension     Past Surgical History:  Procedure Laterality Date  . AMPUTATION Left 07/26/2015   Procedure: AMPUTATION LEFT FIFTH RAY;  Surgeon: [name], MD;  Location: [facility];  Service: Orthopedics;  Laterality: Left;  . bilateral hip pins placed    . INCISION AND DRAINAGE PERIRECTAL ABSCESS Right 03/07/2016   Procedure: IRRIGATION AND DEBRIDEMENT PERIRECTAL ABSCESS;  Surgeon: Alphonsa Overall, MD;  Location: [facility];  Service: General;  Laterality: Right;  . INCISION AND DRAINAGE PERIRECTAL ABSCESS Right 03/09/2016   Procedure: EXAM UNDER ANESTHESIA, IRRIGATION AND DEBRIDEMENT PERIRECTAL ABSCESS;  Surgeon: Johnathan Hausen, MD;  Location: [facility];  Service: General;  Laterality: Right;  Open, betadine packed wound    Family History  Problem Relation Age of Onset  . Arthritis Unknown   . Diabetes Unknown   . Hypertension Unknown   . Hyperlipidemia Unknown   . Stroke Unknown   . Sudden death Unknown   . Diabetes Mother   . Diabetes Sister   . Diabetes Brother   . Heart attack Father        Died ate 49, couple of "heart attacks"     Social History:  reports that he quit smoking about 17 months ago. His smoking use included cigarettes. He has a 10.00 pack-year smoking history. he has never used smokeless tobacco. He reports that he does not drink alcohol or use drugs.   Review of Systems   Lipid history: LDL has been low as also HDL without treatment    Lab Results  Component Value Date   CHOL 110 03/23/2016   HDL 28.70 (L) 03/23/2016   LDLCALC 51 03/23/2016   TRIG 152.0 (H) 03/23/2016   CHOLHDL 4 03/23/2016           Hypertension: On Rx For several years, followed by PCP, is taking Lotrel, clonidine patch, and hydralazine Was on chlorthalidone  previously He says when he takes the hydralazine his blood pressure dropped significantly and he feels lightheaded and weak Has not discussed with PCP recently, the pressure medications not changed about a month ago on his office visit   BP Readings from Last 3 Encounters:  01/12/17 (!) 142/96  01/11/17 (!) 156/101  12/13/16 126/88    EDEMA: He gets swelling on and off   Lab Results  Component Value Date   CREATININE 0.93 01/10/2017   BUN 10 01/10/2017   NA 138 01/10/2017   K 3.8 01/10/2017   CL 106 01/10/2017   CO2 24 01/10/2017      Most recent foot exam: 8/17  He has sharp pains in his feet from neuropathy,  on Lyrica and he is still having Significant pain and numbness     LABS:  Admission on 01/11/2017, Discharged on 01/11/2017  Component Date Value Ref Range Status  . Sodium 01/10/2017 138  135 - 145 mmol/L Final  . Potassium 01/10/2017 3.8  3.5 - 5.1 mmol/L Final  . Chloride 01/10/2017 106  101 - 111 mmol/L Final  . CO2 01/10/2017 24  22 - 32 mmol/L Final  . Glucose, Bld 01/10/2017 199* 65 - 99 mg/dL Final  . BUN 01/10/2017 10  6 - 20 mg/dL Final  . Creatinine, Ser 01/10/2017 0.93  0.61 - 1.24 mg/dL Final  . Calcium 01/10/2017 9.0  8.9 - 10.3 mg/dL Final  . GFR calc non Af Amer 01/10/2017 >60  >60 mL/min Final  . GFR calc Af Amer 01/10/2017 >60  >60 mL/min Final   Comment: (NOTE) The  eGFR has been calculated using the CKD EPI equation. This calculation has not been validated in all clinical situations. eGFR's persistently <60 mL/min signify possible Chronic Kidney Disease.   . Anion gap 01/10/2017 8  5 - 15 Final  . WBC 01/10/2017 9.0  4.0 - 10.5 K/uL Final  . RBC 01/10/2017 4.79  4.22 - 5.81 MIL/uL Final  . Hemoglobin 01/10/2017 14.2  13.0 - 17.0 g/dL Final  . HCT 01/10/2017 42.3  39.0 - 52.0 % Final  . MCV 01/10/2017 88.3  78.0 - 100.0 fL Final  . MCH 01/10/2017 29.6  26.0 - 34.0 pg Final  . MCHC 01/10/2017 33.6  30.0 - 36.0 g/dL Final  . RDW  01/10/2017 12.9  11.5 - 15.5 % Final  . Platelets 01/10/2017 309  150 - 400 K/uL Final  . Troponin I 01/10/2017 <0.03  <0.03 ng/mL Final  . Troponin i, poc 01/11/2017 0.00  0.00 - 0.08 ng/mL Final  . Comment 3 01/11/2017          Final   Comment: Due to the release kinetics of cTnI, a negative result within the first hours of the onset of symptoms does not rule out myocardial infarction with certainty. If myocardial infarction is still suspected, repeat the test at appropriate intervals.   . Color, Urine 01/11/2017 YELLOW  YELLOW Final  . APPearance 01/11/2017 CLEAR  CLEAR Final  . Specific Gravity, Urine 01/11/2017 1.027  1.005 - 1.030 Final  . pH 01/11/2017 6.0  5.0 - 8.0 Final  . Glucose, UA 01/11/2017 150* NEGATIVE mg/dL Final  . Hgb urine dipstick 01/11/2017 NEGATIVE  NEGATIVE Final  . Bilirubin Urine 01/11/2017 NEGATIVE  NEGATIVE Final  . Ketones, ur 01/11/2017 NEGATIVE  NEGATIVE mg/dL Final  . Protein, ur 01/11/2017 100* NEGATIVE mg/dL Final  . Nitrite 01/11/2017 NEGATIVE  NEGATIVE Final  . Leukocytes, UA 01/11/2017 NEGATIVE  NEGATIVE Final  . RBC / HPF 01/11/2017 0-5  0 - 5 RBC/hpf Final  . WBC, UA 01/11/2017 0-5  0 - 5 WBC/hpf Final  . Bacteria, UA 01/11/2017 RARE* NONE SEEN Final  . Squamous Epithelial / LPF 01/11/2017 0-5* NONE SEEN Final  . Mucus 01/11/2017 PRESENT   Final  . Sperm, UA 01/11/2017 PRESENT   Final  Lab on 01/10/2017  Component Date Value Ref Range Status  . Glucose, Bld 01/10/2017 210* 70 - 99 mg/dL Final  . Fructosamine 01/10/2017 401* 0 - 285 umol/L Final   Comment: Published reference interval for apparently healthy subjects between age 36 and 24 is 50 - 285 umol/L and in a poorly controlled diabetic population is 228 - 563 umol/L with a mean of 396 umol/L.   Marland Kitchen Sodium 01/10/2017 137  135 - 145 mEq/L Final  . Potassium 01/10/2017 4.1  3.5 - 5.1 mEq/L Final  . Chloride 01/10/2017 104  96 - 112 mEq/L Final  . CO2 01/10/2017 28  19 - 32 mEq/L Final    . Glucose, Bld 01/10/2017 210* 70 - 99 mg/dL Final  . BUN 01/10/2017 15  6 - 23 mg/dL Final  . Creatinine, Ser 01/10/2017 0.96  0.40 - 1.50 mg/dL Final  . Total Bilirubin 01/10/2017 0.5  0.2 - 1.2 mg/dL Final  . Alkaline Phosphatase 01/10/2017 78  39 - 117 U/L Final  . AST 01/10/2017 18  0 - 37 U/L Final  . ALT 01/10/2017 16  0 - 53 U/L Final  . Total Protein 01/10/2017 6.7  6.0 - 8.3 g/dL Final  . Albumin 01/10/2017 3.6  3.5 - 5.2 g/dL Final  . Calcium 01/10/2017 9.1  8.4 - 10.5 mg/dL Final  . GFR 01/10/2017 111.86  >60.00 mL/min Final  . C-Peptide 01/10/2017 2.5  1.1 - 4.4 ng/mL Final   C-Peptide reference interval is for fasting patients.    Physical Examination:  BP (!) 142/96   Pulse 95   Ht 5' 8.5" (1.74 m)   Wt 269 lb 3.2 oz (122.1 kg)   SpO2 98%   BMI 40.34 kg/m    ASSESSMENT:  Diabetes type 2, uncontrolled With BMI 40 now He has had long-standing diabetes with  persistently poor control  See history of present illness for detailed discussion of current diabetes management, blood sugar patterns and problems identified   His blood sugars are much worse overall with fructosamine 401 He appears to be getting progressively more insulin resistant and also gaining weight However even with taking about 72 units on his basal per day of his new Omnipod pump his blood sugars are still mostly high except overnight  Recent blood sugars are quite variable and yesterday his blood sugars are fairly good until suppertime but otherwise has had readings well over 200 fairly regularly He appears to be managing his Omnipod pump fairly well without any supervision Not clear if his carbohydrate coverage is adequate as postprandial readings are not available after his main meal in the evening   HYPERTENSION: control is significantly worse and not clear why His sodium intake is not excessive He is reports intolerance to hydralazine which drops his blood pressure was symptomatically low  levels  PLAN:    He will reduce his basal rate at midnight down to 2.8 until 4 PM and then use 2.5 until 8 PM and continue 4.0 after 8 PM  More blood sugars after supper and bedtime  He can enter his boluses even without needing to check blood sugar at the time of meals if he is not able to  He will discuss issues with his pump alarms with the diabetes educator  May also consider a GLP-1 drug if postprandial readings tend to be high  He is going to get a C-peptide done today to enable him to get the CGM  Start CHLORTHALIDONE 25 mg daily and stop hydralazine for his hypertension, follow-up with PCP We'll continue to monitor electrolytes which are normal currently  Counseling time on subjects discussed in assessment and plan sections is over 50% of today's 25 minute visit    There are no Patient Instructions on file for this visit.  Danny Fuller 01/12/2017, 11:49 AM   Note: This office note was prepared with Estate agent. Any transcriptional errors that result from this process are unintentional.

## 2017-01-13 LAB — C-PEPTIDE: C-Peptide: 1.1 ng/mL (ref 1.1–4.4)

## 2017-01-15 ENCOUNTER — Other Ambulatory Visit: Payer: Self-pay | Admitting: Endocrinology

## 2017-01-23 ENCOUNTER — Ambulatory Visit: Payer: Medicaid Other | Admitting: Nutrition

## 2017-01-26 ENCOUNTER — Encounter (HOSPITAL_BASED_OUTPATIENT_CLINIC_OR_DEPARTMENT_OTHER): Payer: Self-pay | Admitting: Emergency Medicine

## 2017-01-26 ENCOUNTER — Emergency Department (HOSPITAL_BASED_OUTPATIENT_CLINIC_OR_DEPARTMENT_OTHER)
Admission: EM | Admit: 2017-01-26 | Discharge: 2017-01-26 | Disposition: A | Discharge status: Home / Self Care | Payer: Medicaid Other | Attending: Emergency Medicine | Admitting: Emergency Medicine

## 2017-01-26 ENCOUNTER — Other Ambulatory Visit: Payer: Self-pay

## 2017-01-26 DIAGNOSIS — E1122 Type 2 diabetes mellitus with diabetic chronic kidney disease: Secondary | ICD-10-CM | POA: Diagnosis not present

## 2017-01-26 DIAGNOSIS — E1142 Type 2 diabetes mellitus with diabetic polyneuropathy: Secondary | ICD-10-CM | POA: Diagnosis not present

## 2017-01-26 DIAGNOSIS — Z794 Long term (current) use of insulin: Secondary | ICD-10-CM | POA: Insufficient documentation

## 2017-01-26 DIAGNOSIS — I129 Hypertensive chronic kidney disease with stage 1 through stage 4 chronic kidney disease, or unspecified chronic kidney disease: Secondary | ICD-10-CM | POA: Insufficient documentation

## 2017-01-26 DIAGNOSIS — E785 Hyperlipidemia, unspecified: Secondary | ICD-10-CM | POA: Insufficient documentation

## 2017-01-26 DIAGNOSIS — Z87891 Personal history of nicotine dependence: Secondary | ICD-10-CM | POA: Insufficient documentation

## 2017-01-26 DIAGNOSIS — Z79899 Other long term (current) drug therapy: Secondary | ICD-10-CM | POA: Diagnosis not present

## 2017-01-26 DIAGNOSIS — J45909 Unspecified asthma, uncomplicated: Secondary | ICD-10-CM | POA: Diagnosis not present

## 2017-01-26 DIAGNOSIS — L731 Pseudofolliculitis barbae: Secondary | ICD-10-CM

## 2017-01-26 DIAGNOSIS — N189 Chronic kidney disease, unspecified: Secondary | ICD-10-CM | POA: Insufficient documentation

## 2017-01-26 DIAGNOSIS — L0201 Cutaneous abscess of face: Secondary | ICD-10-CM | POA: Diagnosis present

## 2017-01-26 MED ORDER — LIDOCAINE-EPINEPHRINE 2 %-1:100000 IJ SOLN
20.0000 mL | Freq: Once | INTRAMUSCULAR | Status: DC
Start: 2017-01-26 — End: 2017-01-26
  Filled 2017-01-26: qty 1

## 2017-01-26 MED ORDER — DOXYCYCLINE HYCLATE 100 MG PO CAPS: 100.0000 mg | ORAL_CAPSULE | Freq: Two times a day (BID) | ORAL | 0 refills | Status: DC

## 2017-01-26 NOTE — Discharge Instructions (Signed)
You can take Tylenol or ibuprofen for pain Keep wound clean with warm soap and water and keep bandage dry, do not submerge in water for 24 hours. Change bandage sooner if it gets dirty Take antibiotic with food Return for fever, increased redness, swelling, pain, or worsening drainage

## 2017-01-26 NOTE — ED Triage Notes (Signed)
Abscess to [facility] x 1 week.

## 2017-01-26 NOTE — ED Provider Notes (Signed)
[facility] Provider Note   CSN: [id number] Arrival date & time: 01/26/17  1535     History   Chief Complaint Chief Complaint  Patient presents with  . Abscess    HPI Danny Fuller is a 41 y.o. male who presents with an facial abscess. PMH significant for insulin dependent DM, CKD, HTN, HLD. The area started as an ingrown hair. It has been gradually worsening over the past 4 days - 1 week. There has been a small amount of drainage from the area and his wife states she pulled the hair out last night. No fevers.  HPI  Past Medical History:  Diagnosis Date  . Asthma   . Chronic kidney disease    sees Dr. Jamal Maes    . Diabetes mellitus    sees Dr. Dwyane Dee   . GERD (gastroesophageal reflux disease)   . Headache(784.0)   . Hyperlipidemia   . Hypertension     Patient Active Problem List   Diagnosis Date Noted  . PVD (peripheral vascular disease) ([place]) 11/28/2016  . Palpitation 11/28/2016  . Erectile dysfunction associated with type 2 diabetes mellitus ([place]) 05/08/2016  . Poorly controlled diabetes mellitus ([place]) 03/30/2016  . Necrotizing fasciitis ([place]) 03/07/2016  . Pyelonephritis 03/04/2016  . Acquired contracture of Achilles tendon, left 02/15/2016  . Arthralgia of multiple joints 01/19/2016  . Diabetic polyneuropathy associated with type 2 diabetes mellitus ([place]) 01/18/2016  . Acquired absence of other left toe(s) ([place]) 01/18/2016  . Foot amputation status, left ([place]) 01/04/2016  . Peripheral neuropathy 11/11/2015  . Asthma 07/23/2015  . Chronic headache 07/23/2015  . Erectile disorder due to medical condition in male 04/23/2015  . BMI 33.0-33.9,adult 04/21/2012  . GERD 04/21/2008  . Type 2 diabetes mellitus with diabetic neuropathy, with long-term current use of insulin ([place]) 11/21/2006  . Essential hypertension 11/21/2006  . LUMBAR STRAIN 11/21/2006    Past Surgical History:  Procedure Laterality Date  . AMPUTATION  Left 07/26/2015   Procedure: AMPUTATION LEFT FIFTH RAY;  Surgeon: [name], MD;  Location: [facility];  Service: Orthopedics;  Laterality: Left;  . bilateral hip pins placed    . INCISION AND DRAINAGE PERIRECTAL ABSCESS Right 03/07/2016   Procedure: IRRIGATION AND DEBRIDEMENT PERIRECTAL ABSCESS;  Surgeon: Alphonsa Overall, MD;  Location: [facility];  Service: General;  Laterality: Right;  . INCISION AND DRAINAGE PERIRECTAL ABSCESS Right 03/09/2016   Procedure: EXAM UNDER ANESTHESIA, IRRIGATION AND DEBRIDEMENT PERIRECTAL ABSCESS;  Surgeon: Johnathan Hausen, MD;  Location: [facility];  Service: General;  Laterality: Right;  Open, betadine packed wound       Home Medications    Prior to Admission medications   Medication Sig Start Date End Date Taking? Authorizing Provider  amLODipine-benazepril (LOTREL) 10-40 MG capsule Take 1 capsule by mouth daily. 05/29/16   [name], MD  chlorthalidone (HYGROTON) 25 MG tablet Take 1 tablet (25 mg total) by mouth daily. 01/12/17   [name], MD  cloNIDine (CATAPRES - DOSED IN MG/24 HR) 0.1 mg/24hr patch apply 1 patch every week 10/05/16   [name], MD  empagliflozin (JARDIANCE) 25 MG TABS tablet Take 25 mg by mouth daily. 09/04/16   [name], MD  glucose blood (ACCU-CHEK AVIVA) test strip Use to test blood sugar 1 time daily 09/22/16   [name], MD  hydrALAZINE (APRESOLINE) 25 MG tablet Take 1 tablet (25 mg total) by mouth every 8 (eight) hours. Patient not taking: Reported on 01/12/2017 03/27/16   Sarajane Jews,  Ishmael Holter, MD  insulin aspart (NOVOLOG) 100 UNIT/ML FlexPen Inject 50 units daily with Insulin Pump 01/12/17   [name], MD  Insulin Disposable Pump (V-GO 40) KIT Use one pump every 24 hours Patient not taking: Reported on 01/12/2017 12/13/16   [name], MD  Lancet Devices [facility]) lancets Use as instructed 08/10/16   [name], MD  meloxicam (MOBIC) 15 MG tablet Take 1 tablet (15 mg total) by mouth daily. 08/03/16   [name], MD    metFORMIN (GLUCOPHAGE-XR) 500 MG 24 hr tablet Take 4 tablets (2,000 mg total) by mouth daily with supper. 03/28/16   [name], MD  methocarbamol (ROBAXIN) 500 MG tablet Take 1 tablet (500 mg total) by mouth 2 (two) times daily. 01/11/17   Muthersbaugh, Jarrett Soho, PA-C  NOVOLOG 100 UNIT/ML injection inject 66 units subcutaneously three times a day before meals 01/15/17   [name], MD  pravastatin (PRAVACHOL) 40 MG tablet Take 1 tablet (40 mg total) by mouth every evening. 11/27/16 02/25/17  Minus Breeding, MD  pregabalin (LYRICA) 100 MG capsule Take 1 tablet in the morning, 1 tablet in the afternoon, and 2 tablets at bedtime 12/11/16   Narda Amber K, DO  pregabalin (LYRICA) 150 MG capsule Take 150-200 mg by mouth See admin instructions. Take 1 capsule every morning and take 2 capsules every evening    [provider]  traMADol (ULTRAM) 50 MG tablet take 2 tablets by mouth every 6 hours if needed for pain 08/25/16   [name], MD    Family History Family History  Problem Relation Age of Onset  . Arthritis Unknown   . Diabetes Unknown   . Hypertension Unknown   . Hyperlipidemia Unknown   . Stroke Unknown   . Sudden death Unknown   . Diabetes Mother   . Diabetes Sister   . Diabetes Brother   . Heart attack Father        Died ate 44, couple of "heart attacks"     Social History Social History   Tobacco Use  . Smoking status: Former Smoker    Packs/day: 0.50    Years: 20.00    Pack years: 10.00    Types: Cigarettes    Last attempt to quit: 07/23/2015    Years since quitting: 1.5  . Smokeless tobacco: Never Used  Substance Use Topics  . Alcohol use: No    Alcohol/week: 0.0 oz  . Drug use: No     Allergies   Vancomycin   Review of Systems Review of Systems  Constitutional: Negative for fever.  Skin:       +abscess     Physical Exam Updated Vital Signs BP (!) 155/82 (BP Location: Right Arm)   Pulse (!) 108   Temp 99 F (37.2 C) (Oral)   Resp 16    Ht 5' 8.5" (1.74 m)   Wt 122 kg (269 lb)   SpO2 98%   BMI 40.31 kg/m   Physical Exam  Constitutional: He is oriented to person, place, and time. He appears well-developed and well-nourished. No distress.  HENT:  Head: Normocephalic and atraumatic.  Indurated, erythematous area over the right lower [place] with yellow center which is draining a small amount of fluid  Eyes: Conjunctivae are normal. Pupils are equal, round, and reactive to light. Right eye exhibits no discharge. Left eye exhibits no discharge. No scleral icterus.  Neck: Normal range of motion.  Cardiovascular: Normal rate.  Pulmonary/Chest: Effort normal. No respiratory distress.  Abdominal: He exhibits no distension.  Neurological: He is alert and oriented to person, place, and time.  Skin: Skin is warm and dry.  Psychiatric: He has a normal mood and affect. His behavior is normal.  Nursing note and vitals reviewed.      ED Treatments / Results  Labs (all labs ordered are listed, but only abnormal results are displayed) Labs Reviewed - No data to display  EKG  EKG Interpretation None       Radiology No results found.  Procedures .Marland KitchenIncision and Drainage Date/Time: 01/26/2017 6:52 PM Performed by: Recardo Evangelist, PA-C Authorized by: Recardo Evangelist, PA-C   Consent:    Consent obtained:  Verbal   Consent given by:  Patient   Risks discussed:  Bleeding, incomplete drainage and pain   Alternatives discussed:  No treatment Location:    Type:  Abscess   Location:  Head   Head location:  Face ([facility] [place]) Pre-procedure details:    Skin preparation:  Antiseptic wash Anesthesia (see MAR for exact dosages):    Anesthesia method:  Local infiltration   Local anesthetic:  Lidocaine 2% WITH epi Procedure type:    Complexity:  Simple Procedure details:    Needle aspiration: no     Incision types:  Stab incision   Incision depth:  Dermal   Scalpel blade:  11   Wound management:  Probed and  deloculated   Drainage:  Purulent   Drainage amount:  Moderate   Wound treatment:  Wound left open   Packing materials:  None Post-procedure details:    Patient tolerance of procedure:  Tolerated well, no immediate complications   (including critical care time)    Medications Ordered in ED Medications - No data to display   Initial Impression / Assessment and Plan / ED Course  I have reviewed the triage vital signs and the nursing notes.  Pertinent labs & imaging results that were available during my care of the patient were reviewed by me and considered in my medical decision making (see chart for details).  42 year old male presents with a facial abscess due to an ingrown hair. He is mildly tachycardic and hypertensive but otherwise vitals are normal. I&D of abscess was done and a moderate amount of purulent drainage was expressed. He was discharged with Doxy BID for 5 days and was given wound care instruction and strict return precautions.  Final Clinical Impressions(s) / ED Diagnoses   Final diagnoses:  Ingrown hair  Abscess of face    ED Discharge Orders    None       Recardo Evangelist, PA-C 01/26/17 1856    Drenda Freeze, MD 01/29/17 [id number]

## 2017-01-26 NOTE — ED Notes (Signed)
I&D caddy to bedside.

## 2017-01-27 ENCOUNTER — Encounter: Payer: Self-pay | Admitting: Endocrinology

## 2017-01-29 ENCOUNTER — Encounter: Payer: Self-pay | Admitting: Pulmonary Disease

## 2017-01-29 ENCOUNTER — Ambulatory Visit (INDEPENDENT_AMBULATORY_CARE_PROVIDER_SITE_OTHER): Payer: Medicaid Other | Admitting: Pulmonary Disease

## 2017-01-29 DIAGNOSIS — F5104 Psychophysiologic insomnia: Secondary | ICD-10-CM | POA: Diagnosis not present

## 2017-01-29 DIAGNOSIS — G4733 Obstructive sleep apnea (adult) (pediatric): Secondary | ICD-10-CM

## 2017-01-29 DIAGNOSIS — G4700 Insomnia, unspecified: Secondary | ICD-10-CM | POA: Insufficient documentation

## 2017-01-29 NOTE — Progress Notes (Signed)
Subjective:    Patient ID: Danny Fuller, male    DOB: January 23, 1976, 41 y.o.   MRN: [id number]  HPI  Chief Complaint  Patient presents with  . Sleep Consult    Referred by Dr.Fry for possible OSA. Per patient, he is not able to sleep at night. Finds himself falling asleep during the day at work. Denies ever having a sleep study before.    42 year old man referred for evaluation of sleep disordered breathing and insomnia. He reports difficulty falling asleep for the last year and a half since he had toe amputation due to advanced diabetes.  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 His wife has witnessed apneas and loud snoring and even then a video recording during his sleep.  He stops breathing during sleep and wakes up gasping for air when he does sleep.  He reports excessive sleepiness during the daytime and has occasionally. He was evaluated by cardiology for palpitations cut himself dozing off at a stoplight, Holter for 3 days was uneventful and the 30-day monitor is planned.  Epworth sleepiness score is 23 and he reports sleepiness in various social situations such as sitting and reading, watching TV, as a passenger in a car and even sitting and talking to someone!  Bedtime is around 2 AM and sometimes it can be up to 4 AM when he falls asleep, he has tried Tylenol PM without success, he sleeps on his back with one pillow, reports 5-6 nocturnal awakenings including nocturia and is out of bed by 7:30 AM feeling tired with dryness of mouth and occasional headaches.  Will take an occasional nap but does not feel rested. He drinks a cup of coffee in the morning. He has lost 70 pounds in 2017 his weight is now up again in 2018 to his current weight of 268 pounds.  There is no history suggestive of cataplexy, sleep paralysis or parasomnias   He has diabetes and hypertension, vancomycin induced renal toxicity and renal function is recovered he quit smoking in June  2017 after his surgery.      Past Medical History:  Diagnosis Date  . Asthma   . Chronic kidney disease    sees Dr. Jamal Maes    . Diabetes mellitus    sees Dr. Dwyane Dee   . GERD (gastroesophageal reflux disease)   . Headache(784.0)   . Hyperlipidemia   . Hypertension    Past Surgical History:  Procedure Laterality Date  . AMPUTATION Left 07/26/2015   Procedure: AMPUTATION LEFT FIFTH RAY;  Surgeon: [name], MD;  Location: [facility];  Service: Orthopedics;  Laterality: Left;  . bilateral hip pins placed    . INCISION AND DRAINAGE PERIRECTAL ABSCESS Right 03/07/2016   Procedure: IRRIGATION AND DEBRIDEMENT PERIRECTAL ABSCESS;  Surgeon: Alphonsa Overall, MD;  Location: [facility];  Service: General;  Laterality: Right;  . INCISION AND DRAINAGE PERIRECTAL ABSCESS Right 03/09/2016   Procedure: EXAM UNDER ANESTHESIA, IRRIGATION AND DEBRIDEMENT PERIRECTAL ABSCESS;  Surgeon: Johnathan Hausen, MD;  Location: [facility];  Service: General;  Laterality: Right;  Open, betadine packed wound    Allergies  Allergen Reactions  . Vancomycin     Social History   Socioeconomic History  . Marital status: Married    Spouse name: Not on file  . Number of children: 2  . Years of education: Not on file  . Highest education level: Not on file  Social Needs  . Emergency planning/management officer  strain: Not on file  . Food insecurity - worry: Not on file  . Food insecurity - inability: Not on file  . Transportation needs - medical: Not on file  . Transportation needs - non-medical: Not on file  Occupational History  . Occupation: disability  Tobacco Use  . Smoking status: Former Smoker    Packs/day: 0.50    Years: 20.00    Pack years: 10.00    Types: Cigarettes    Last attempt to quit: 07/23/2015    Years since quitting: 1.5  . Smokeless tobacco: Never Used  Substance and Sexual Activity  . Alcohol use: No    Alcohol/week: 0.0 oz  . Drug use: No  . Sexual activity: Not on file  Other Topics Concern  . Not on  file  Social History Narrative   Lives with wife and two children in a one story home.  Not working.  Still trying for disability.  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  Education: high school.        Family History  Problem Relation Age of Onset  . Arthritis Unknown   . Diabetes Unknown   . Hypertension Unknown   . Hyperlipidemia Unknown   . Stroke Unknown   . Sudden death Unknown   . Diabetes Mother   . Diabetes Sister   . Diabetes Brother   . Heart attack Father        Died ate 87, couple of "heart attacks"       Review of Systems    Positive for shortness of breath with activity, weight gain, anxiety, joint stiffness  Constitutional: negative for anorexia, fevers and sweats  Eyes: negative for irritation, redness and visual disturbance  Ears, nose, mouth, throat, and face: negative for earaches, epistaxis, nasal congestion and sore throat  Respiratory: negative for cough, dyspnea on exertion, sputum and wheezing  Cardiovascular: negative for chest pain, dyspnea, lower extremity edema, orthopnea, palpitations and syncope  Gastrointestinal: negative for abdominal pain, constipation, diarrhea, melena, nausea and vomiting  Genitourinary:negative for dysuria, frequency and hematuria  Hematologic/lymphatic: negative for bleeding, easy bruising and lymphadenopathy  Musculoskeletal:negative for arthralgias, muscle weakness and stiff joints  Neurological: negative for coordination problems, gait problems, headaches and weakness  Endocrine: negative for diabetic symptoms including polydipsia, polyuria and weight loss Download yet Objective:   Physical Exam  Gen. Pleasant, obese, in no distress, normal affect ENT - normal size tonsils, no post nasal drip, class 2-3 airway Neck: No JVD, no thyromegaly, no carotid bruits Lungs: no use of accessory muscles, no dullness to percussion, decreased without rales or rhonchi  Cardiovascular: Rhythm regular, heart  sounds  normal, no murmurs or gallops, no peripheral edema Abdomen: soft and non-tender, no hepatosplenomegaly, BS normal. Musculoskeletal: No deformities, no cyanosis or clubbing Neuro:  alert, non focal, no tremors       Assessment & Plan:

## 2017-01-29 NOTE — Patient Instructions (Signed)
Home sleep study will be scheduled.  Rules of sleep hygiene were discussed  - light exercise -avoid caffeinated beverages - no more than 20 mins staying awake in bed, if not asleep, get out of bed & reading or light music - No TV or computer games at bedtime.  Okay to trial melatonin 3-5 mg 1 hour before bedtime

## 2017-01-29 NOTE — Assessment & Plan Note (Signed)
Rules of sleep hygiene were discussed  - light exercise -avoid caffeinated beverages - no more than 20 mins staying awake in bed, if not asleep, get out of bed & reading or light music - No TV or computer games at bedtime.  Okay to trial melatonin 3-5 mg 1 hour before bedtime Already on Lyrica and tramadol so hesitant to give any more hypnotics

## 2017-01-29 NOTE — Assessment & Plan Note (Signed)
Given excessive daytime somnolence, narrow pharyngeal exam, witnessed apneas & loud snoring, obstructive sleep apnea is very likely & an overnight polysomnogram will be scheduled as a home study. The pathophysiology of obstructive sleep apnea , it's cardiovascular consequences & modes of treatment including CPAP were discused with the patient in detail & they evidenced understanding.  Pretest probability is high although confounded by comorbid insomnia

## 2017-02-07 ENCOUNTER — Inpatient Hospital Stay (HOSPITAL_COMMUNITY)
Admission: EM | Admit: 2017-02-07 | Discharge: 2017-02-10 | DRG: 149 | Disposition: A | Discharge status: Home / Self Care | Payer: Medicaid Other | Attending: Family Medicine | Admitting: Family Medicine

## 2017-02-07 ENCOUNTER — Emergency Department (HOSPITAL_COMMUNITY): Payer: Medicaid Other

## 2017-02-07 ENCOUNTER — Encounter (HOSPITAL_COMMUNITY): Payer: Self-pay | Admitting: Emergency Medicine

## 2017-02-07 ENCOUNTER — Other Ambulatory Visit: Payer: Self-pay

## 2017-02-07 DIAGNOSIS — I130 Hypertensive heart and chronic kidney disease with heart failure and stage 1 through stage 4 chronic kidney disease, or unspecified chronic kidney disease: Secondary | ICD-10-CM | POA: Diagnosis present

## 2017-02-07 DIAGNOSIS — I160 Hypertensive urgency: Secondary | ICD-10-CM | POA: Diagnosis present

## 2017-02-07 DIAGNOSIS — Z89422 Acquired absence of other left toe(s): Secondary | ICD-10-CM

## 2017-02-07 DIAGNOSIS — E872 Acidosis: Secondary | ICD-10-CM | POA: Diagnosis present

## 2017-02-07 DIAGNOSIS — I10 Essential (primary) hypertension: Secondary | ICD-10-CM

## 2017-02-07 DIAGNOSIS — N189 Chronic kidney disease, unspecified: Secondary | ICD-10-CM | POA: Diagnosis present

## 2017-02-07 DIAGNOSIS — J45909 Unspecified asthma, uncomplicated: Secondary | ICD-10-CM | POA: Diagnosis present

## 2017-02-07 DIAGNOSIS — Z6841 Body Mass Index (BMI) 40.0 and over, adult: Secondary | ICD-10-CM

## 2017-02-07 DIAGNOSIS — Z833 Family history of diabetes mellitus: Secondary | ICD-10-CM

## 2017-02-07 DIAGNOSIS — Z87891 Personal history of nicotine dependence: Secondary | ICD-10-CM

## 2017-02-07 DIAGNOSIS — Z9889 Other specified postprocedural states: Secondary | ICD-10-CM

## 2017-02-07 DIAGNOSIS — E11649 Type 2 diabetes mellitus with hypoglycemia without coma: Secondary | ICD-10-CM | POA: Diagnosis present

## 2017-02-07 DIAGNOSIS — E1140 Type 2 diabetes mellitus with diabetic neuropathy, unspecified: Secondary | ICD-10-CM

## 2017-02-07 DIAGNOSIS — Z8249 Family history of ischemic heart disease and other diseases of the circulatory system: Secondary | ICD-10-CM

## 2017-02-07 DIAGNOSIS — Z881 Allergy status to other antibiotic agents status: Secondary | ICD-10-CM

## 2017-02-07 DIAGNOSIS — E1122 Type 2 diabetes mellitus with diabetic chronic kidney disease: Secondary | ICD-10-CM | POA: Diagnosis present

## 2017-02-07 DIAGNOSIS — N179 Acute kidney failure, unspecified: Secondary | ICD-10-CM | POA: Diagnosis not present

## 2017-02-07 DIAGNOSIS — I272 Pulmonary hypertension, unspecified: Secondary | ICD-10-CM | POA: Diagnosis present

## 2017-02-07 DIAGNOSIS — E785 Hyperlipidemia, unspecified: Secondary | ICD-10-CM | POA: Diagnosis present

## 2017-02-07 DIAGNOSIS — K219 Gastro-esophageal reflux disease without esophagitis: Secondary | ICD-10-CM | POA: Diagnosis present

## 2017-02-07 DIAGNOSIS — Z794 Long term (current) use of insulin: Secondary | ICD-10-CM | POA: Diagnosis not present

## 2017-02-07 DIAGNOSIS — H81399 Other peripheral vertigo, unspecified ear: Principal | ICD-10-CM | POA: Diagnosis present

## 2017-02-07 DIAGNOSIS — E1165 Type 2 diabetes mellitus with hyperglycemia: Secondary | ICD-10-CM | POA: Diagnosis present

## 2017-02-07 DIAGNOSIS — Z9641 Presence of insulin pump (external) (internal): Secondary | ICD-10-CM | POA: Diagnosis present

## 2017-02-07 DIAGNOSIS — E662 Morbid (severe) obesity with alveolar hypoventilation: Secondary | ICD-10-CM | POA: Diagnosis present

## 2017-02-07 DIAGNOSIS — I50813 Acute on chronic right heart failure: Secondary | ICD-10-CM | POA: Diagnosis present

## 2017-02-07 DIAGNOSIS — N182 Chronic kidney disease, stage 2 (mild): Secondary | ICD-10-CM

## 2017-02-07 LAB — I-STAT CG4 LACTIC ACID, ED: LACTIC ACID, VENOUS: 2.13 mmol/L — AB (ref 0.5–1.9)

## 2017-02-07 LAB — BASIC METABOLIC PANEL WITH GFR
Anion gap: 8 (ref 5–15)
BUN: 24 mg/dL — ABNORMAL HIGH (ref 6–20)
CO2: 23 mmol/L (ref 22–32)
Calcium: 8.7 mg/dL — ABNORMAL LOW (ref 8.9–10.3)
Chloride: 106 mmol/L (ref 101–111)
Creatinine, Ser: 1.58 mg/dL — ABNORMAL HIGH (ref 0.61–1.24)
GFR calc Af Amer: >60 mL/min (ref >60)
GFR calc non Af Amer: 54 mL/min — ABNORMAL LOW (ref >60)
Glucose, Bld: 199 mg/dL — ABNORMAL HIGH (ref 65–99)
Potassium: 4.2 mmol/L (ref 3.5–5.1)
Sodium: 137 mmol/L (ref 135–145)

## 2017-02-07 LAB — GLUCOSE, CAPILLARY: Glucose-Capillary: 146 mg/dL — ABNORMAL HIGH (ref 65–99)

## 2017-02-07 LAB — URINALYSIS, ROUTINE W REFLEX MICROSCOPIC
Bilirubin Urine: NEGATIVE
Glucose, UA: NEGATIVE mg/dL
Ketones, ur: NEGATIVE mg/dL
Leukocytes, UA: NEGATIVE
Nitrite: NEGATIVE
Protein, ur: >=300 mg/dL — AB
Specific Gravity, Urine: 1.023 (ref 1.005–1.030)
Squamous Epithelial / HPF: NONE SEEN
pH: 5 (ref 5.0–8.0)

## 2017-02-07 LAB — TROPONIN I: Troponin I: <0.03 ng/mL (ref <0.03)

## 2017-02-07 LAB — CBC
HCT: 36 % — ABNORMAL LOW (ref 39.0–52.0)
Hemoglobin: 12.2 g/dL — ABNORMAL LOW (ref 13.0–17.0)
MCH: 29.3 pg (ref 26.0–34.0)
MCHC: 33.9 g/dL (ref 30.0–36.0)
MCV: 86.5 fL (ref 78.0–100.0)
PLATELETS: 326 10*3/uL (ref 150–400)
RBC: 4.16 MIL/uL — ABNORMAL LOW (ref 4.22–5.81)
RDW: 12.7 % (ref 11.5–15.5)
WBC: 6.6 10*3/uL (ref 4.0–10.5)

## 2017-02-07 LAB — BRAIN NATRIURETIC PEPTIDE: B Natriuretic Peptide: 169.6 pg/mL — ABNORMAL HIGH (ref 0.0–100.0)

## 2017-02-07 LAB — CBG MONITORING, ED
GLUCOSE-CAPILLARY: 194 mg/dL — AB (ref 65–99)
Glucose-Capillary: 189 mg/dL — ABNORMAL HIGH (ref 65–99)
Glucose-Capillary: 159 mg/dL — ABNORMAL HIGH (ref 65–99)
Glucose-Capillary: 116 mg/dL — ABNORMAL HIGH (ref 65–99)
Glucose-Capillary: 93 mg/dL (ref 65–99)

## 2017-02-07 LAB — LACTIC ACID, PLASMA: LACTIC ACID, VENOUS: 1.4 mmol/L (ref 0.5–1.9)

## 2017-02-07 MED ORDER — SODIUM CHLORIDE 0.9 % IV BOLUS (SEPSIS)
500.0000 mL | Freq: Once | INTRAVENOUS | Status: AC
  Administered 2017-02-07: 500 mL via INTRAVENOUS

## 2017-02-07 MED ORDER — ENOXAPARIN SODIUM 40 MG/0.4ML ~~LOC~~ SOLN
40.0000 mg | SUBCUTANEOUS | Status: DC
  Administered 2017-02-07 – 2017-02-09 (×3): 40 mg via SUBCUTANEOUS
  Filled 2017-02-07 (×3): qty 0.4

## 2017-02-07 MED ORDER — PRAVASTATIN SODIUM 40 MG PO TABS
40.0000 mg | ORAL_TABLET | Freq: Every evening | ORAL | Status: DC
  Administered 2017-02-07 – 2017-02-09 (×3): 40 mg via ORAL
  Filled 2017-02-07 (×4): qty 1

## 2017-02-07 MED ORDER — HYDRALAZINE HCL 25 MG PO TABS
25.0000 mg | ORAL_TABLET | Freq: Three times a day (TID) | ORAL | Status: DC
  Administered 2017-02-07 – 2017-02-10 (×9): 25 mg via ORAL
  Filled 2017-02-07 (×9): qty 1

## 2017-02-07 MED ORDER — CLONIDINE HCL 0.1 MG/24HR TD PTWK
0.1000 mg | MEDICATED_PATCH | TRANSDERMAL | Status: DC
  Filled 2017-02-07: qty 1

## 2017-02-07 MED ORDER — ACETAMINOPHEN 500 MG PO TABS
1000.0000 mg | ORAL_TABLET | Freq: Once | ORAL | Status: AC
  Administered 2017-02-07: 1000 mg via ORAL
  Filled 2017-02-07: qty 2

## 2017-02-07 MED ORDER — TRAMADOL HCL 50 MG PO TABS
100.0000 mg | ORAL_TABLET | Freq: Four times a day (QID) | ORAL | Status: DC | PRN
  Administered 2017-02-07 – 2017-02-09 (×6): 100 mg via ORAL
  Filled 2017-02-07 (×6): qty 2

## 2017-02-07 MED ORDER — INSULIN PUMP
Freq: Three times a day (TID) | SUBCUTANEOUS | Status: DC
  Administered 2017-02-07 – 2017-02-08 (×4): via SUBCUTANEOUS
  Administered 2017-02-08: 0.3 via SUBCUTANEOUS
  Administered 2017-02-08 – 2017-02-09 (×6): via SUBCUTANEOUS
  Administered 2017-02-10: 3 via SUBCUTANEOUS
  Administered 2017-02-10 (×2): via SUBCUTANEOUS
  Filled 2017-02-07: qty 1

## 2017-02-07 MED ORDER — POLYETHYLENE GLYCOL 3350 17 G PO PACK
17.0000 g | PACK | Freq: Every day | ORAL | Status: DC | PRN
  Filled 2017-02-07: qty 1

## 2017-02-07 MED ORDER — AMLODIPINE BESYLATE 10 MG PO TABS
10.0000 mg | ORAL_TABLET | Freq: Every day | ORAL | Status: DC
  Administered 2017-02-07 – 2017-02-10 (×4): 10 mg via ORAL
  Filled 2017-02-07 (×4): qty 1

## 2017-02-07 MED ORDER — ONDANSETRON 4 MG PO TBDP
4.0000 mg | ORAL_TABLET | Freq: Once | ORAL | Status: AC
  Administered 2017-02-07: 4 mg via ORAL
  Filled 2017-02-07: qty 1

## 2017-02-07 MED ORDER — CHLORTHALIDONE 25 MG PO TABS
25.0000 mg | ORAL_TABLET | Freq: Every day | ORAL | Status: DC
  Administered 2017-02-07: 25 mg via ORAL
  Filled 2017-02-07 (×2): qty 1

## 2017-02-07 MED ORDER — ONDANSETRON HCL 4 MG PO TABS: 4.0000 mg | ORAL_TABLET | Freq: Four times a day (QID) | ORAL | Status: DC | PRN

## 2017-02-07 MED ORDER — ONDANSETRON HCL 4 MG/2ML IJ SOLN
4.0000 mg | Freq: Four times a day (QID) | INTRAMUSCULAR | Status: DC | PRN
  Administered 2017-02-08: 4 mg via INTRAVENOUS
  Filled 2017-02-07: qty 2

## 2017-02-07 MED ORDER — HYDRALAZINE HCL 20 MG/ML IJ SOLN
5.0000 mg | Freq: Four times a day (QID) | INTRAMUSCULAR | Status: DC | PRN
  Administered 2017-02-07: 5 mg via INTRAVENOUS
  Filled 2017-02-07: qty 1

## 2017-02-07 MED ORDER — PREGABALIN 75 MG PO CAPS
150.0000 mg | ORAL_CAPSULE | Freq: Three times a day (TID) | ORAL | Status: DC
  Administered 2017-02-07 – 2017-02-10 (×9): 150 mg via ORAL
  Filled 2017-02-07 (×10): qty 2

## 2017-02-07 MED ORDER — INSULIN ASPART 100 UNIT/ML ~~LOC~~ SOLN: 52.0000 [IU] | Freq: Three times a day (TID) | SUBCUTANEOUS | Status: DC

## 2017-02-07 NOTE — ED Triage Notes (Addendum)
Pt states he had his insulin pump increased 2 weeks ago to 3 units per hour. Pt states since then, he wakes up with blood sugars in the 50s/60s and gets nauseated and dizzy. PT sees MD Dwyane Dee. Pt also states his whole body feels swollen.

## 2017-02-07 NOTE — ED Provider Notes (Signed)
Emergency Department Provider Note   I have reviewed the triage vital signs and the nursing notes.   HISTORY  Chief Complaint Hypoglycemia and Dizziness   HPI Danny Fuller is a 41 y.o. male with PMH of CKD (resolved), IDDM on insulin pump, HLD, and HTN presents to the emergency department for evaluation of fatigue, lightheadedness, dyspnea, and hypoglycemia.  The patient has an insulin pump and the basal rate was changed to 3 units/h 3 weeks ago.  Patient states the pump is been working well but over the past several days he is noticed that his blood sugars have been low in the morning in the 50s-60s.  He has been eating to correct them but sometimes has trouble getting them elevated.  He reports some associated sensation of swelling over his body and dyspnea that is worse with exertion.  No chest pain.  Patient also states he is felt fatigued and had some lightheadedness.  He had a facial abscess drained on 12/14 and completed a course of antibiotics for that.  He denies any fevers.  No abdominal pain. He also notes some nausea but denies vomiting and/or diarrhea.   In terms of the patient's swelling her reports unintentional weight gain of almost 40 pounds in the last 2-3 weeks.   Past Medical History:  Diagnosis Date  . Asthma   . Chronic kidney disease    sees Dr. Jamal Maes    . Diabetes mellitus    sees Dr. Dwyane Dee   . GERD (gastroesophageal reflux disease)   . Headache(784.0)   . Hyperlipidemia   . Hypertension     Patient Active Problem List   Diagnosis Date Noted  . OSA (obstructive sleep apnea) 01/29/2017  . Insomnia 01/29/2017  . PVD (peripheral vascular disease) ([place]) 11/28/2016  . Palpitation 11/28/2016  . Erectile dysfunction associated with type 2 diabetes mellitus ([place]) 05/08/2016  . Poorly controlled diabetes mellitus ([place]) 03/30/2016  . Necrotizing fasciitis ([place]) 03/07/2016  . Pyelonephritis 03/04/2016  . Acquired contracture of Achilles tendon,  left 02/15/2016  . Arthralgia of multiple joints 01/19/2016  . Diabetic polyneuropathy associated with type 2 diabetes mellitus ([place]) 01/18/2016  . Acquired absence of other left toe(s) ([place]) 01/18/2016  . Foot amputation status, left ([place]) 01/04/2016  . Peripheral neuropathy 11/11/2015  . Asthma 07/23/2015  . Chronic headache 07/23/2015  . Erectile disorder due to medical condition in male 04/23/2015  . BMI 33.0-33.9,adult 04/21/2012  . GERD 04/21/2008  . Type 2 diabetes mellitus with diabetic neuropathy, with long-term current use of insulin ([place]) 11/21/2006  . Essential hypertension 11/21/2006  . LUMBAR STRAIN 11/21/2006    Past Surgical History:  Procedure Laterality Date  . AMPUTATION Left 07/26/2015   Procedure: AMPUTATION LEFT FIFTH RAY;  Surgeon: [name], MD;  Location: [facility];  Service: Orthopedics;  Laterality: Left;  . bilateral hip pins placed    . INCISION AND DRAINAGE PERIRECTAL ABSCESS Right 03/07/2016   Procedure: IRRIGATION AND DEBRIDEMENT PERIRECTAL ABSCESS;  Surgeon: Alphonsa Overall, MD;  Location: [facility];  Service: General;  Laterality: Right;  . INCISION AND DRAINAGE PERIRECTAL ABSCESS Right 03/09/2016   Procedure: EXAM UNDER ANESTHESIA, IRRIGATION AND DEBRIDEMENT PERIRECTAL ABSCESS;  Surgeon: Johnathan Hausen, MD;  Location: [facility];  Service: General;  Laterality: Right;  Open, betadine packed wound    Current Outpatient Rx  . Order #: [id number] Class: Normal  . Order #: [id number] Class: Normal  . Order #: [id number] Class: Normal  . Order #: [id number] Class: Print  . Order #:  [id number] Class: Normal  . Order #: [id number] Class: Normal  . Order #: [id number] Class: Print  . Order #: [id number] Class: Normal  . Order #: [id number] Class: Normal  . Order #: [id number] Class: Normal  . Order #: [id number] Class: Normal  . Order #: [id number] Class: Print  . Order #: [id number] Class: Phone In  . Order #: [id number] Class: Normal  . Order #: [id number] Class: Print     Allergies Vancomycin  Family History  Problem Relation Age of Onset  . Arthritis Unknown   . Diabetes Unknown   . Hypertension Unknown   . Hyperlipidemia Unknown   . Stroke Unknown   . Sudden death Unknown   . Diabetes Mother   . Diabetes Sister   . Diabetes Brother   . Heart attack Father        Died ate 75, couple of "heart attacks"     Social History Social History   Tobacco Use  . Smoking status: Former Smoker    Packs/day: 0.50    Years: 20.00    Pack years: 10.00    Types: Cigarettes    Last attempt to quit: 07/23/2015    Years since quitting: 1.5  . Smokeless tobacco: Never Used  Substance Use Topics  . Alcohol use: No    Alcohol/week: 0.0 oz  . Drug use: No    Review of Systems  Constitutional: No fever/chills. Positive lightheadedness and fatigue.  Eyes: Positive blurry vision.  ENT: No sore throat. Cardiovascular: Denies chest pain. Respiratory: Positive shortness of breath with exertion.  Gastrointestinal: No abdominal pain. Positive nausea, no vomiting.  No diarrhea.  No constipation. Genitourinary: Negative for dysuria. Musculoskeletal: Negative for back pain. Skin: Negative for rash. Neurological: Negative for headaches, focal weakness or numbness.  10-point ROS otherwise negative.  ____________________________________________   PHYSICAL EXAM:  VITAL SIGNS: ED Triage Vitals  Enc Vitals Group     BP 02/07/17 1337 (!) 187/106     Pulse Rate 02/07/17 1337 89     Resp 02/07/17 1337 18     Temp 02/07/17 1337 98 F (36.7 C)     Temp src --      SpO2 02/07/17 1337 100 %     Pain Score 02/07/17 1334 0   Constitutional: Alert and oriented. Well appearing and joking with family at bedside when I walk in the room.  Eyes: Conjunctivae are normal.  Head: Atraumatic. Nose: No congestion/rhinnorhea. Mouth/Throat: Mucous membranes are sl Neck: No stridor.   Cardiovascular: Normal rate, regular rhythm. Good peripheral circulation. Grossly  normal heart sounds.   Respiratory: Normal respiratory effort.  No retractions. Lungs CTAB. Gastrointestinal: Soft and nontender. No distention.  Musculoskeletal: No lower extremity tenderness with 1+ pitting edema. No gross deformities of extremities. Neurologic:  Normal speech and language. No gross focal neurologic deficits are appreciated.  Skin:  Skin is warm, dry and intact. No rash noted.  ____________________________________________   LABS (all labs ordered are listed, but only abnormal results are displayed)  Labs Reviewed  BASIC METABOLIC PANEL - Abnormal; Notable for the following components:      Result Value   Glucose, Bld 199 (*)    BUN 24 (*)    Creatinine, Ser 1.58 (*)    Calcium 8.7 (*)    GFR calc non Af Amer 54 (*)    All other components within normal limits  CBC - Abnormal; Notable for the following components:   RBC 4.16 (*)    Hemoglobin 12.2 (*)    HCT 36.0 (*)    All  other components within normal limits  URINALYSIS, ROUTINE W REFLEX MICROSCOPIC - Abnormal; Notable for the following components:   Hgb urine dipstick SMALL (*)    Protein, ur >=300 (*)    Bacteria, UA RARE (*)    All other components within normal limits  BRAIN NATRIURETIC PEPTIDE - Abnormal; Notable for the following components:   B Natriuretic Peptide 169.6 (*)    All other components within normal limits  CBG MONITORING, ED - Abnormal; Notable for the following components:   Glucose-Capillary 189 (*)    All other components within normal limits  CBG MONITORING, ED - Abnormal; Notable for the following components:   Glucose-Capillary 194 (*)    All other components within normal limits  I-STAT CG4 LACTIC ACID, ED - Abnormal; Notable for the following components:   Lactic Acid, Venous 2.13 (*)    All other components within normal limits  CBG MONITORING, ED - Abnormal; Notable for the following components:   Glucose-Capillary 159 (*)    All other components within normal limits   TROPONIN I   ____________________________________________  EKG   EKG Interpretation  Date/Time:  Wednesday February 07 2017 13:27:17 EST Ventricular Rate:  91 PR Interval:  162 QRS Duration: 80 QT Interval:  336 QTC Calculation: 413 R Axis:   111 Text Interpretation:  Normal sinus rhythm Left posterior fascicular block T wave abnormality, consider inferior ischemia Abnormal ECG No STEMI.  Confirmed by Nanda Quinton [phone]) on 02/07/2017 3:22:19 PM       ____________________________________________  RADIOLOGY  Dg Chest 2 View  Result Date: 02/07/2017 CLINICAL DATA:  Low blood sugars.  Nausea.  Dizziness. EXAM: CHEST  2 VIEW COMPARISON:  01/11/2017.  01/10/2017. FINDINGS: Mediastinum and hilar structures normal. Cardiomegaly with normal pulmonary vascularity. Low lung volumes with mild basilar atelectasis. No pleural effusion or pneumothorax. IMPRESSION: Low lung volumes with mild basilar atelectasis. Cardiomegaly with normal pulmonary vascularity . Electronically Signed   By: Marcello Moores  Register   On: 02/07/2017 16:15    ____________________________________________   PROCEDURES  Procedure(s) performed:   Procedures  None ____________________________________________   INITIAL IMPRESSION / ASSESSMENT AND PLAN / ED COURSE  Pertinent labs & imaging results that were available during my care of the patient were reviewed by me and considered in my medical decision making (see chart for details).  Patient presents to the emergency department with lightheadedness, fatigue, exertional dyspnea, total body swelling.  Not convinced this is entirely explained by the patient's report of low blood sugars at home.  The lowest he is experienced have been in the 50s-60s and continues to have similar symptoms with elevated blood sugars here in the emergency department.  Lactic acid was ordered from triage which was slightly elevated.  The patient has no leukocytosis.  He denies any fevers  or chills at home.  He did recently have an abscess drained from his face but that area is very well-appearing and he completed a course of antibiotics.  Plan for additional workup including BNP, troponin, chest x-ray.  I will give the patient a small IV fluid bolus and plan to repeat his lactic acid.  At this time I do not think patient needs to change his basal rate.  He will discuss this with endocrinology.   05:09 PM BNP elevated. CXR with no acute findings. Suspect that the patient's edema and fatigue may actually be 2/2 AKI. He has been taking a long of Excedrin and Motrin for the last several days and his unintentional weight  gain and edema is concerning. Plan for observational admission for AKI evaluation and consideration of ECHO. Will discuss with the hospitalist. CBG remain elevated. No hypoglycemia.   Spoke with the hospitalist regarding the case. They will evaluate the patient prior to accepting care.   05:42 PM Dr. Teryl Lucy has evaluated the patient and will leave a consult note. No indication for admission, observation or otherwise, at this time. Plan for discharge with plan for PCP follow up.   Wife returned and is very upset about the plan. I attempted to reassure her and communicate the reasoning. She was not here when Dr. Teryl Lucy evaluated the patient. I called Dr. Teryl Lucy back and he agrees to come and speak with the patient and wife again. Appreciate assistance with this case.   ____________________________________________  FINAL CLINICAL IMPRESSION(S) / ED DIAGNOSES  Final diagnoses:  AKI (acute kidney injury) ([place])     MEDICATIONS GIVEN DURING THIS VISIT:  Medications  acetaminophen (TYLENOL) tablet 1,000 mg (not administered)  sodium chloride 0.9 % bolus 500 mL (500 mLs Intravenous New Bag/Given 02/07/17 1601)    Note:  This document was prepared using Dragon voice recognition software and may include unintentional dictation errors.  Nanda Quinton, MD Emergency  Medicine    Long, [facility], MD 02/07/17 [phone]

## 2017-02-07 NOTE — ED Notes (Signed)
Checked patient blood sugar it was 189 notified RN Vicente Males of blood sugar

## 2017-02-07 NOTE — Discharge Instructions (Signed)
You were seen in the ED today and found to have kidney function that was slightly weak. This will need close follow up with your PCP. Call tomorrow to schedule an urgent appointment. Drink plenty of non-sugary/non-alcoholic fluids. Try taking Tylenol for pain and limit any Motrin and/or Excedrin until your kidney labs are re-checked. I am prescribing some nausea medication. Be sure to take your blood pressure medications as prescribed.
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
## 2017-02-08 ENCOUNTER — Observation Stay (HOSPITAL_COMMUNITY): Payer: Medicaid Other

## 2017-02-08 ENCOUNTER — Encounter (HOSPITAL_COMMUNITY): Payer: Self-pay | Admitting: *Deleted

## 2017-02-08 ENCOUNTER — Observation Stay (HOSPITAL_BASED_OUTPATIENT_CLINIC_OR_DEPARTMENT_OTHER): Payer: Medicaid Other

## 2017-02-08 ENCOUNTER — Other Ambulatory Visit: Payer: Self-pay

## 2017-02-08 DIAGNOSIS — Z794 Long term (current) use of insulin: Secondary | ICD-10-CM | POA: Diagnosis not present

## 2017-02-08 DIAGNOSIS — E1140 Type 2 diabetes mellitus with diabetic neuropathy, unspecified: Secondary | ICD-10-CM | POA: Diagnosis not present

## 2017-02-08 DIAGNOSIS — I517 Cardiomegaly: Secondary | ICD-10-CM

## 2017-02-08 LAB — BASIC METABOLIC PANEL
Anion gap: 8 (ref 5–15)
BUN: 24 mg/dL — AB (ref 6–20)
CO2: 24 mmol/L (ref 22–32)
CREATININE: 1.12 mg/dL (ref 0.61–1.24)
Calcium: 8.6 mg/dL — ABNORMAL LOW (ref 8.9–10.3)
Chloride: 107 mmol/L (ref 101–111)
GFR calc Af Amer: >60 mL/min (ref >60)
Glucose, Bld: 107 mg/dL — ABNORMAL HIGH (ref 65–99)
Potassium: 4 mmol/L (ref 3.5–5.1)
SODIUM: 139 mmol/L (ref 135–145)

## 2017-02-08 LAB — GLUCOSE, CAPILLARY
GLUCOSE-CAPILLARY: 121 mg/dL — AB (ref 65–99)
GLUCOSE-CAPILLARY: 141 mg/dL — AB (ref 65–99)
GLUCOSE-CAPILLARY: 95 mg/dL (ref 65–99)
Glucose-Capillary: 96 mg/dL (ref 65–99)
Glucose-Capillary: 137 mg/dL — ABNORMAL HIGH (ref 65–99)

## 2017-02-08 LAB — ECHOCARDIOGRAM COMPLETE
Height: 68.5 in
WEIGHTICAEL: 4285.74 [oz_av]

## 2017-02-08 LAB — HIV ANTIBODY (ROUTINE TESTING W REFLEX): HIV SCREEN 4TH GENERATION: NONREACTIVE

## 2017-02-08 LAB — LACTIC ACID, PLASMA: LACTIC ACID, VENOUS: 1.1 mmol/L (ref 0.5–1.9)

## 2017-02-08 MED ORDER — FUROSEMIDE 10 MG/ML IJ SOLN
40.0000 mg | Freq: Once | INTRAMUSCULAR | Status: AC
  Administered 2017-02-08: 40 mg via INTRAVENOUS
  Filled 2017-02-08: qty 4

## 2017-02-08 MED ORDER — MECLIZINE HCL 25 MG PO TABS
25.0000 mg | ORAL_TABLET | Freq: Three times a day (TID) | ORAL | Status: DC
  Administered 2017-02-08 – 2017-02-10 (×7): 25 mg via ORAL
  Filled 2017-02-08 (×8): qty 1
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
## 2017-02-08 NOTE — Progress Notes (Signed)
Arrival Method: Patient arrived in stretcher from ED. Mental Orientation: alert  oriented Telemetry:no Assessment: See Doc Flow sheets. Skin: Warm, dry and intact. IV: Peripheral I  Right  wrist Pain:.denies Fall Prevention Safety Plan: Patient educated about fall prevention safety plan, understood and acknowledged. Admission Screening:5100Orientation: Patient has been oriented to the unit, staff and to the room.

## 2017-02-08 NOTE — Progress Notes (Signed)
  Echocardiogram 2D Echocardiogram has been performed.  Danny Fuller 02/08/2017, 11:46 AM

## 2017-02-08 NOTE — Progress Notes (Addendum)
[facility] Progress Note  Patient: Danny Fuller ION:[id number]   PCP: [name], MD DOB: [birth year]   DOA: 02/07/2017   DOS: 02/08/2017   Date of Service: the patient was seen and examined on 02/08/2017  Subjective: Pain is about having dizziness which she describes as room spinning sensation especially when he changes his head, generalized fatigue, bilateral leg pain as well as swelling.  Has complaints of fatigue throughout the day.  Brief hospital course: Pt. with PMH of type 2 diabetes mellitus, morbid obesity; admitted on 02/07/2017, presented with complaint of fatigue, dizziness, blurred vision, was found to have peripheral vertigo, acute on chronic right-sided heart failure. Currently further plan is IV diuresis, MRI brain.  Assessment and Plan: 1.  Dizziness, peripheral vertigo. Patient's symptoms appear more likely of peripheral vertigo due to severity although central vertigo cannot be ruled out. Right-sided passpointing, abnormal heel to toe test. We will get an MRI of his brain. PT OT as well as vestibular evaluation. Scheduled meclizine 25 mg 3 times a day. Continue to monitor on telemetry.  2.  Essential hypertension. Accelerated hypertension. Blood pressure relatively controlled, continue current regimen. Hold patient's home diuretic while getting Lasix.  3.  Acute kidney injury. Etiology not clear although renal function back to baseline.  Monitor. No decrease in urination no abnormal urinary symptoms.  4.  Acute on chronic right-sided heart failure. Echocardiogram shows preserved EF, unable to calculated diastolic function. Elevated right-sided pressures as well as pulmonary hypertension. Most likely consistent with patient's history of morbid obesity as well as possible sleep apnea. We will attempt to diurese with IV Lasix. Patient has gained 30 pounds in last 3 months.  5.  Generalized fatigue. Obesity hypoventilation syndrome, obstructive  sleep apnea. Patient is already scheduled for outpatient sleep study on February 27, 2017 recommend to continue following up on the same.  6.  Type 2 diabetes mellitus. Uncontrolled. Hemoglobin A1c 10.8 in October 2018. Will recheck tomorrow. Patient's insulin regimen has recently been adjusted which is led to multiple hypoglycemic episodes especially during the morning time. Continuing insulin pump based on recommendation from Chief of Staff. Monitor on telemetry.  7.  Blood vision. Currently resolved. Most likely associated with his hypo-as well as hyperglycemic episodes.  Monitor.  Diet: Carb modified diet DVT Prophylaxis: subcutaneous Heparin  Advance goals of care discussion: full code  Family Communication: family was present at bedside, at the time of interview. The pt provided permission to discuss medical plan with the family. Opportunity was given to ask question and all questions were answered satisfactorily.   Disposition:  Discharge to home.  Consultants: none Procedures: Echocardiogram   Antibiotics: Anti-infectives (From admission, onward)   None       Objective: Physical Exam: Vitals:   02/08/17 0544 02/08/17 0740 02/08/17 1308 02/08/17 1554  BP: (!) 167/92 (!) 160/98 (!) 167/91 (!) 159/92  Pulse: 97 94  97  Resp: 20 18  18   Temp: 98.4 F (36.9 C) 98.2 F (36.8 C)  98.1 F (36.7 C)  TempSrc: Oral Oral  Oral  SpO2: 100% 99%  98%  Weight:      Height:        Intake/Output Summary (Last 24 hours) at 02/08/2017 1756 Last data filed at 02/08/2017 1407 Gross per 24 hour  Intake 1180 ml  Output 0 ml  Net 1180 ml   Filed Weights   02/07/17 2132  Weight: 121.5 kg (267 lb 13.7 oz)   General: Alert, Awake and Oriented to  Time, Place and Person. Appear in moderate distress, affect appropriate Eyes: PERRL, Conjunctiva normal ENT: Oral Mucosa clear moist. Neck: difficult to assess  JVD, no Abnormal Mass Or lumps Cardiovascular: S1 and S2  Present, no Murmur, Peripheral Pulses Present Respiratory: normal respiratory effort, Bilateral Air entry equal and Decreased, no use of accessory muscle, Clear to Auscultation, no Crackles, no wheezes Abdomen: Bowel Sound present, Soft and no tenderness, no hernia Skin: no redness, no Rash, no induration Extremities: bilateral Pedal edema, no calf tenderness Neurologic: Mental status AAOx3, speech normal, attention normal,  Cranial Nerves PERRL, EOM normal and present, central nystagmus present pacifically to right. Motor strength bilateral equal strength 5/5,  Sensation present to light touch,  Cerebellar test normal finger nose finger on the left, abnormal on the right Gait not checked due to patient safety concerns.  Data Reviewed: CBC: Recent Labs  Lab 02/07/17 1343  WBC 6.6  HGB 12.2*  HCT 36.0*  MCV 86.5  PLT 062   Basic Metabolic Panel: Recent Labs  Lab 02/07/17 1343 02/08/17 0818  NA 137 139  K 4.2 4.0  CL 106 107  CO2 23 24  GLUCOSE 199* 107*  BUN 24* 24*  CREATININE 1.58* 1.12  CALCIUM 8.7* 8.6*    Liver Function Tests: No results for input(s): AST, ALT, ALKPHOS, BILITOT, PROT, ALBUMIN in the last 168 hours. No results for input(s): LIPASE, AMYLASE in the last 168 hours. No results for input(s): AMMONIA in the last 168 hours. Coagulation Profile: No results for input(s): INR, PROTIME in the last 168 hours. Cardiac Enzymes: Recent Labs  Lab 02/07/17 1552  TROPONINI <0.03   BNP (last 3 results) No results for input(s): PROBNP in the last 8760 hours. CBG: Recent Labs  Lab 02/07/17 2131 02/08/17 0203 02/08/17 0739 02/08/17 1140 02/08/17 1640  GLUCAP 146* 121* 96 137* 141*   Studies: No results found.  Scheduled Meds: . amLODipine  10 mg Oral Daily  . cloNIDine  0.1 mg Transdermal Weekly  . enoxaparin (LOVENOX) injection  40 mg Subcutaneous Q24H  . hydrALAZINE  25 mg Oral Q8H  . insulin pump   Subcutaneous TID AC, HS, 0200  . meclizine  25  mg Oral TID  . pravastatin  40 mg Oral QPM  . pregabalin  150 mg Oral TID   Continuous Infusions: PRN Meds: hydrALAZINE, ondansetron **OR** ondansetron (ZOFRAN) IV, polyethylene glycol, traMADol  Time spent: 35 minutes  Author: Berle Mull, MD [facility] Pager: [phone] 02/08/2017 5:56 PM  If 7PM-7AM, please contact night-coverage at [email or web address], password [facility]

## 2017-02-09 ENCOUNTER — Encounter (HOSPITAL_COMMUNITY): Payer: Self-pay

## 2017-02-09 DIAGNOSIS — I50813 Acute on chronic right heart failure: Secondary | ICD-10-CM | POA: Diagnosis present

## 2017-02-09 DIAGNOSIS — E1122 Type 2 diabetes mellitus with diabetic chronic kidney disease: Secondary | ICD-10-CM | POA: Diagnosis present

## 2017-02-09 DIAGNOSIS — E1165 Type 2 diabetes mellitus with hyperglycemia: Secondary | ICD-10-CM | POA: Diagnosis present

## 2017-02-09 DIAGNOSIS — E785 Hyperlipidemia, unspecified: Secondary | ICD-10-CM | POA: Diagnosis present

## 2017-02-09 DIAGNOSIS — R112 Nausea with vomiting, unspecified: Secondary | ICD-10-CM | POA: Diagnosis not present

## 2017-02-09 DIAGNOSIS — J45909 Unspecified asthma, uncomplicated: Secondary | ICD-10-CM | POA: Diagnosis present

## 2017-02-09 DIAGNOSIS — Z9641 Presence of insulin pump (external) (internal): Secondary | ICD-10-CM | POA: Diagnosis present

## 2017-02-09 DIAGNOSIS — Z833 Family history of diabetes mellitus: Secondary | ICD-10-CM | POA: Diagnosis not present

## 2017-02-09 DIAGNOSIS — E662 Morbid (severe) obesity with alveolar hypoventilation: Secondary | ICD-10-CM | POA: Diagnosis present

## 2017-02-09 DIAGNOSIS — Z881 Allergy status to other antibiotic agents status: Secondary | ICD-10-CM | POA: Diagnosis not present

## 2017-02-09 DIAGNOSIS — Z6841 Body Mass Index (BMI) 40.0 and over, adult: Secondary | ICD-10-CM | POA: Diagnosis not present

## 2017-02-09 DIAGNOSIS — E1140 Type 2 diabetes mellitus with diabetic neuropathy, unspecified: Secondary | ICD-10-CM | POA: Diagnosis not present

## 2017-02-09 DIAGNOSIS — I130 Hypertensive heart and chronic kidney disease with heart failure and stage 1 through stage 4 chronic kidney disease, or unspecified chronic kidney disease: Secondary | ICD-10-CM | POA: Diagnosis present

## 2017-02-09 DIAGNOSIS — Z8249 Family history of ischemic heart disease and other diseases of the circulatory system: Secondary | ICD-10-CM | POA: Diagnosis not present

## 2017-02-09 DIAGNOSIS — Z87891 Personal history of nicotine dependence: Secondary | ICD-10-CM | POA: Diagnosis not present

## 2017-02-09 DIAGNOSIS — E11649 Type 2 diabetes mellitus with hypoglycemia without coma: Secondary | ICD-10-CM | POA: Diagnosis present

## 2017-02-09 DIAGNOSIS — N189 Chronic kidney disease, unspecified: Secondary | ICD-10-CM | POA: Diagnosis present

## 2017-02-09 DIAGNOSIS — N179 Acute kidney failure, unspecified: Secondary | ICD-10-CM | POA: Diagnosis not present

## 2017-02-09 DIAGNOSIS — K219 Gastro-esophageal reflux disease without esophagitis: Secondary | ICD-10-CM | POA: Diagnosis present

## 2017-02-09 DIAGNOSIS — Z794 Long term (current) use of insulin: Secondary | ICD-10-CM | POA: Diagnosis not present

## 2017-02-09 DIAGNOSIS — H81399 Other peripheral vertigo, unspecified ear: Secondary | ICD-10-CM | POA: Diagnosis not present

## 2017-02-09 DIAGNOSIS — E872 Acidosis: Secondary | ICD-10-CM | POA: Diagnosis present

## 2017-02-09 DIAGNOSIS — I272 Pulmonary hypertension, unspecified: Secondary | ICD-10-CM | POA: Diagnosis present

## 2017-02-09 DIAGNOSIS — I160 Hypertensive urgency: Secondary | ICD-10-CM | POA: Diagnosis present

## 2017-02-09 DIAGNOSIS — I10 Essential (primary) hypertension: Secondary | ICD-10-CM | POA: Diagnosis not present

## 2017-02-09 DIAGNOSIS — Z89422 Acquired absence of other left toe(s): Secondary | ICD-10-CM | POA: Diagnosis not present

## 2017-02-09 LAB — GLUCOSE, CAPILLARY
GLUCOSE-CAPILLARY: 166 mg/dL — AB (ref 65–99)
GLUCOSE-CAPILLARY: 111 mg/dL — AB (ref 65–99)
Glucose-Capillary: 162 mg/dL — ABNORMAL HIGH (ref 65–99)
Glucose-Capillary: 103 mg/dL — ABNORMAL HIGH (ref 65–99)

## 2017-02-09 MED ORDER — CLONIDINE HCL 0.1 MG/24HR TD PTWK
0.1000 mg | MEDICATED_PATCH | TRANSDERMAL | Status: DC
  Administered 2017-02-09: 0.1 mg via TRANSDERMAL
  Filled 2017-02-09: qty 1

## 2017-02-09 NOTE — Evaluation (Signed)
Occupational Therapy Evaluation Patient Details Name: Danny Fuller MRN: [id number] DOB: 1975-11-24 Today's Date: 02/09/2017    History of Present Illness Pt. with PMH of type 2 diabetes mellitus, morbid obesity; admitted on 02/07/2017, presented with complaint of fatigue, dizziness, blurred vision, was found to have peripheral vertigo, acute on chronic right-sided heart failure   Clinical Impression   PTA Pt modified independent in ADL and mobility (SPC and shower seat used PRN). Pt is currently min A +2 for safe transfers (likely +1 with RW) and mod A for LB ADL due to head positioning changes and set up for UB ADL. Pt will benefit from skilled OT in the acute setting to continue compensatory strategies and BPPV exercises to maximize safety and independence in ADL.     Follow Up Recommendations  No OT follow up;Supervision - Intermittent(outpatient Vestibular PT services)    Equipment Recommendations  Other (comment)(RW)    Recommendations for Other Services       Precautions / Restrictions Precautions Precautions: Fall Restrictions Weight Bearing Restrictions: No      Mobility Bed Mobility Overal bed mobility: Needs Assistance Bed Mobility: Rolling;Sidelying to Sit Rolling: Min guard Sidelying to sit: Mod assist       General bed mobility comments: mod A for trunk elevation  Transfers Overall transfer level: Needs assistance Equipment used: 2 person hand held assist Transfers: Sit to/from Stand Sit to Stand: Min assist;+2 physical assistance;+2 safety/equipment         General transfer comment: assist for balance and steady    Balance Overall balance assessment: Needs assistance Sitting-balance support: Single extremity supported;Feet supported Sitting balance-Leahy Scale: Poor Sitting balance - Comments: relies on at least one UE for support   Standing balance support: Bilateral upper extremity supported;During functional activity Standing  balance-Leahy Scale: Poor Standing balance comment: much more stable with BUE support                           ADL either performed or assessed with clinical judgement   ADL Overall ADL's : Needs assistance/impaired Eating/Feeding: Modified independent   Grooming: Set up;Sitting;Oral care Grooming Details (indicate cue type and reason): EOB Upper Body Bathing: Minimal assistance   Lower Body Bathing: Moderate assistance   Upper Body Dressing : Modified independent   Lower Body Dressing: Moderate assistance   Toilet Transfer: +2 for safety/equipment;Ambulation;Minimal assistance(2 person HHA) Toilet Transfer Details (indicate cue type and reason): likely less assist needed if RW used Toileting- Water quality scientist and Hygiene: Moderate assistance;Sit to/from stand Toileting - Clothing Manipulation Details (indicate cue type and reason): for balance, Pt able to perform peri care     Functional mobility during ADLs: Minimal assistance;+2 for safety/equipment(likely less with use of DME) General ADL Comments: Dizziness/Room spinning is the biggest impact on independence     Vision Baseline Vision/History: No visual deficits Patient Visual Report: Blurring of vision Additional Comments: visual assessment deferred this session in lieu of vestibular assessment and visual testing causing dizziness     Perception     Praxis      Pertinent Vitals/Pain Pain Assessment: No/denies pain     Hand Dominance     Extremity/Trunk Assessment Upper Extremity Assessment Upper Extremity Assessment: Overall WFL for tasks assessed   Lower Extremity Assessment Lower Extremity Assessment: Defer to PT evaluation       Communication Communication Communication: No difficulties   Cognition Arousal/Alertness: Awake/alert Behavior During Therapy: WFL for tasks assessed/performed Overall Cognitive Status: Within  Functional Limits for tasks assessed                                      General Comments       Exercises     Shoulder Instructions      Home Living Family/patient expects to be discharged to:: Private residence Living Arrangements: Spouse/significant other Available Help at Discharge: Family;Available PRN/intermittently Type of Home: House Home Access: Stairs to enter     Home Layout: One level     Bathroom Shower/Tub: Teacher, early years/pre: Standard     Home Equipment: Cane - single point;Shower seat          Prior Functioning/Environment Level of Independence: Independent with assistive device(s)        Comments: used SPC PRN, shower chair PRN        OT Problem List: Decreased activity tolerance;Impaired balance (sitting and/or standing);Impaired vision/perception;Decreased safety awareness;Decreased knowledge of use of DME or AE;Decreased knowledge of precautions;Obesity      OT Treatment/Interventions: Self-care/ADL training;DME and/or AE instruction;Therapeutic activities;Visual/perceptual remediation/compensation;Patient/family education;Balance training    OT Goals(Current goals can be found in the care plan section) Acute Rehab OT Goals Patient Stated Goal: to get back to normal OT Goal Formulation: With patient Time For Goal Achievement: 02/23/17 Potential to Achieve Goals: Good ADL Goals Pt Will Perform Grooming: with modified independence;standing Pt Will Transfer to Toilet: with modified independence;ambulating Pt Will Perform Toileting - Clothing Manipulation and hygiene: with modified independence;sit to/from stand Additional ADL Goal #1: Pt will demonstrate 2 ways of compensating for symptoms of vertigo impacting ADL Additional ADL Goal #2: Pt will perform bed mobility at mod I level prior to engaging in ADL activity  OT Frequency: Min 2X/week   Barriers to D/C:            Co-evaluation PT/OT/SLP Co-Evaluation/Treatment: Yes Reason for Co-Treatment: For patient/therapist  safety;To address functional/ADL transfers PT goals addressed during session: Mobility/safety with mobility;Balance;Other (comment)(Vestibular) OT goals addressed during session: ADL's and self-care      AM-PAC PT "6 Clicks" Daily Activity     Outcome Measure Help from another person eating meals?: None Help from another person taking care of personal grooming?: A Little(in standing) Help from another person toileting, which includes using toliet, bedpan, or urinal?: A Little Help from another person bathing (including washing, rinsing, drying)?: A Little Help from another person to put on and taking off regular upper body clothing?: A Little Help from another person to put on and taking off regular lower body clothing?: A Lot 6 Click Score: 18   End of Session Equipment Utilized During Treatment: Gait belt Nurse Communication: Mobility status;Other (comment)(no chair alarm under Pt)  Activity Tolerance: Patient tolerated treatment well Patient left: in chair;with call bell/phone within reach  OT Visit Diagnosis: Unsteadiness on feet (R26.81);Other abnormalities of gait and mobility (R26.89);Low vision, both eyes (H54.2);BPPV;Dizziness and giddiness (R42) BPPV - Right/Left: Right                Time: 2694-8546 OT Time Calculation (min): 23 min Charges:  OT General Charges $OT Visit: 1 Visit OT Evaluation $OT Eval Moderate Complexity: 1 Mod G-Codes: OT G-codes **NOT FOR INPATIENT CLASS** Functional Assessment Tool Used: AM-PAC 6 Clicks Daily Activity Functional Limitation: Self care Self Care Current Status (E7035): At least 40 percent but less than 60 percent impaired, limited or restricted Self Care Goal Status (  G8988): At least 1 percent but less than 20 percent impaired, limited or restricted   Hulda Humphrey OTR/L [place] 02/09/2017, 3:38 PM

## 2017-02-09 NOTE — Evaluation (Signed)
Physical Therapy Evaluation Patient Details Name: Danny Fuller MRN: [id number] DOB: [birth year] Today's Date: 02/09/2017   History of Present Illness  Pt. with PMH of type 2 diabetes mellitus, morbid obesity; admitted on 02/07/2017, presented with complaint of fatigue, dizziness, blurred vision, was found to have peripheral vertigo, acute on chronic right-sided heart failure  Clinical Impression  Pt admitted with above diagnosis. Pt currently with functional limitations due to the deficits listed below (see PT Problem List). Pt was able to ambulate with assist but was dizzy and unsteady without support.  Recommend RW and Outpt PT.  Did treat pt for BPPV but pt appears to also have right hypofunction.  Will continue to progress exercises over weekend.   Pt will benefit from skilled PT to increase their independence and safety with mobility to allow discharge to the venue listed below.     Follow Up Recommendations Outpatient PT(for vestibular rehab)    Equipment Recommendations  Other (comment)(RW for 270 lbs.)    Recommendations for Other Services       Precautions / Restrictions Precautions Precautions: Fall Restrictions Weight Bearing Restrictions: No      Mobility  Bed Mobility Overal bed mobility: Needs Assistance Bed Mobility: Rolling;Sidelying to Sit Rolling: Min guard Sidelying to sit: Mod assist       General bed mobility comments: mod A for trunk elevation  Transfers Overall transfer level: Needs assistance Equipment used: 2 person hand held assist Transfers: Sit to/from Stand Sit to Stand: Min assist;+2 physical assistance;+2 safety/equipment         General transfer comment: assist for balance and steady  Ambulation/Gait Ambulation/Gait assistance: Min assist;+2 physical assistance Ambulation Distance (Feet): 100 Feet Assistive device: 2 person hand held assist Gait Pattern/deviations: Step-through pattern;Decreased stride length;Drifts  right/left;Staggering right;Staggering left;Wide base of support   Gait velocity interpretation: Below normal speed for age/gender General Gait Details: Pt with dizziness with activity needing to use compensation techniques for balance.  Definite need for support externally and bil UE support  Stairs            Wheelchair Mobility    Modified Rankin (Stroke Patients Only)       Balance Overall balance assessment: Needs assistance Sitting-balance support: Single extremity supported;Feet supported Sitting balance-Leahy Scale: Poor Sitting balance - Comments: relies on at least one UE for support   Standing balance support: Bilateral upper extremity supported;During functional activity Standing balance-Leahy Scale: Poor Standing balance comment: much more stable with BUE support                             Pertinent Vitals/Pain Pain Assessment: No/denies pain    Home Living Family/patient expects to be discharged to:: Private residence Living Arrangements: Spouse/significant other Available Help at Discharge: Family;Available PRN/intermittently Type of Home: House Home Access: Stairs to enter   Entrance Stairs-Number of Steps: 2 Home Layout: One level Home Equipment: Cane - single point;Shower seat      Prior Function Level of Independence: Independent with assistive device(s)         Comments: used SPC PRN, shower chair PRN     Hand Dominance        Extremity/Trunk Assessment   Upper Extremity Assessment Upper Extremity Assessment: Defer to OT evaluation    Lower Extremity Assessment Lower Extremity Assessment: RLE deficits/detail;LLE deficits/detail RLE Sensation: decreased light touch LLE Sensation: decreased light touch    Cervical / Trunk Assessment Cervical / Trunk Assessment: Normal  Communication   Communication: No difficulties  Cognition Arousal/Alertness: Awake/alert Behavior During Therapy: WFL for tasks  assessed/performed Overall Cognitive Status: Within Functional Limits for tasks assessed                                        General Comments General comments (skin integrity, edema, etc.): Pt tested positive for right BPPV and treated with Epley maneuver.  Pt still dizzy after treatment however feel that BPPV was treated but pt also appears to have a right hypofunction possibly due to vertigo off and on for last 6 months per pt report.       Exercises Other Exercises Other Exercises: Instructed pt in x1exercises and asked pt to intitiate in am to address his gaze stability issues   Assessment/Plan    PT Assessment Patient needs continued PT services  PT Problem List Decreased strength;Decreased range of motion;Decreased activity tolerance;Decreased balance;Decreased mobility;Decreased knowledge of use of DME;Decreased safety awareness;Decreased knowledge of precautions;Impaired sensation;Decreased coordination(dizziness)       PT Treatment Interventions DME instruction;Gait training;Functional mobility training;Therapeutic activities;Therapeutic exercise;Stair training;Balance training;Patient/family education(canalith repositioning and x1 exercises)    PT Goals (Current goals can be found in the Care Plan section)  Acute Rehab PT Goals Patient Stated Goal: to get back to normal PT Goal Formulation: With patient Time For Goal Achievement: 02/23/17 Potential to Achieve Goals: Good    Frequency Min 3X/week   Barriers to discharge        Co-evaluation PT/OT/SLP Co-Evaluation/Treatment: Yes Reason for Co-Treatment: For patient/therapist safety PT goals addressed during session: Mobility/safety with mobility(vestibular) OT goals addressed during session: ADL's and self-care       AM-PAC PT "6 Clicks" Daily Activity  Outcome Measure Difficulty turning over in bed (including adjusting bedclothes, sheets and blankets)?: None Difficulty moving from lying on  back to sitting on the side of the bed? : A Little Difficulty sitting down on and standing up from a chair with arms (e.g., wheelchair, bedside commode, etc,.)?: A Lot Help needed moving to and from a bed to chair (including a wheelchair)?: A Lot Help needed walking in hospital room?: A Lot Help needed climbing 3-5 steps with a railing? : Total 6 Click Score: 14    End of Session Equipment Utilized During Treatment: Gait belt Activity Tolerance: Patient limited by fatigue(limited by dizziness) Patient left: in chair;with call bell/phone within reach Nurse Communication: Mobility status PT Visit Diagnosis: Unsteadiness on feet (R26.81);BPPV;Dizziness and giddiness (R42) BPPV - Right/Left : Right    Time: 2836-6294 PT Time Calculation (min) (ACUTE ONLY): 39 min   Charges:   PT Evaluation $PT Eval Moderate Complexity: 1 Mod PT Treatments $Gait Training: 8-22 mins   PT G Codes:        Orma Cheetham,PT Acute Rehabilitation [phone] (pager)   Denice Paradise 02/09/2017, 4:44 PM

## 2017-02-09 NOTE — Progress Notes (Signed)
[facility] Progress Note  Patient: Danny Fuller QVZ:[id number]   PCP: [name], MD DOB: 11/03/1975   DOA: 02/07/2017   DOS: 02/09/2017   Date of Service: the patient was seen and examined on 02/09/2017  Subjective: Pt complaints about continued poor oral intake and some nausea  Brief hospital course: Pt. with PMH of type 2 diabetes mellitus, morbid obesity; admitted on 02/07/2017, presented with complaint of fatigue, dizziness, blurred vision, was found to have peripheral vertigo, acute on chronic right-sided heart failure. Currently further plan is IV diuresis, MRI brain.  Assessment and Plan: 1.  Dizziness, peripheral vertigo. Patient's symptoms appear more likely of peripheral vertigo MRI of brain negative for source PT OT as well as vestibular evaluation. PT recommending outpatient PT for vestibular rehab Scheduled meclizine 25 mg 3 times a day. Continue to monitor on telemetry.  2.  Essential hypertension. Accelerated hypertension. Blood pressure relatively controlled, continue current regimen. Hold patient's home diuretic while getting Lasix.  3.  Acute kidney injury. Etiology not clear although renal function back to baseline.  Monitor. No decrease in urination no abnormal urinary symptoms.  4.  Acute on chronic right-sided heart failure.  Echocardiogram shows preserved EF, unable to calculated diastolic function. Elevated right-sided pressures as well as pulmonary hypertension. Most likely consistent with patient's history of morbid obesity as well as possible sleep apnea. Patient has gained 30 pounds in last 3 months.  5.  Generalized fatigue. Obesity hypoventilation syndrome, obstructive sleep apnea. Patient is already scheduled for outpatient sleep study on February 27, 2017 recommend to continue following up on the same.  6.  Type 2 diabetes mellitus. Uncontrolled. Hemoglobin A1c 10.8 in October 2018. Will recheck tomorrow. Patient's insulin  regimen has recently been adjusted which is led to multiple hypoglycemic episodes especially during the morning time. Continuing insulin pump based on recommendation from Chief of Staff. Monitor on telemetry.  7.  Blood vision. Currently resolved. Most likely associated with his hypo-as well as hyperglycemic episodes.  Monitor.  Diet: Carb modified diet DVT Prophylaxis: subcutaneous Heparin  Advance goals of care discussion: full code  Family Communication: no family at bedside.  Disposition:  Discharge to home.  Consultants: none Procedures: Echocardiogram   Antibiotics: Anti-infectives (From admission, onward)   None       Objective: Physical Exam: Vitals:   02/09/17 0926 02/09/17 1001 02/09/17 1138 02/09/17 1141  BP: (!) 159/102 (!) 163/110 (!) 164/100 (!) 162/102  Pulse: 96 97 (!) 103 100  Resp: 20 18    Temp: 98.3 F (36.8 C)     TempSrc: Oral     SpO2: 96% 97%    Weight:      Height:        Intake/Output Summary (Last 24 hours) at 02/09/2017 1644 Last data filed at 02/09/2017 3295 Gross per 24 hour  Intake 600 ml  Output 1150 ml  Net -550 ml   Filed Weights   02/07/17 2132 02/08/17 2302  Weight: 121.5 kg (267 lb 13.7 oz) 121.4 kg (267 lb 10.2 oz)   General: Alert and alert and in nad. Eyes: PERRL, Conjunctiva normal ENT: Oral Mucosa clear moist. Neck: difficult to assess  JVD, no Abnormal Mass Or lumps Cardiovascular: S1 and S2 Present, no Murmur, Peripheral Pulses Present Respiratory: normal respiratory effort, Bilateral Air entry equal and Decreased, no use of accessory muscle, Clear to Auscultation, no Crackles, no wheezes Abdomen: Bowel Sound present, Soft and no tenderness, no hernia Skin: no redness, no Rash, no induration Extremities:  bilateral Pedal edema, no calf tenderness Neurologic: Mental status AAOx3, speech normal, attention normal,  Cranial Nerves PERRL, EOM normal and present, central nystagmus present pacifically to  right. Motor strength bilateral equal strength 5/5,  Sensation present to light touch,  Cerebellar test normal finger nose finger on the left, abnormal on the right Gait not checked due to patient safety concerns.  Data Reviewed: CBC: Recent Labs  Lab 02/07/17 1343  WBC 6.6  HGB 12.2*  HCT 36.0*  MCV 86.5  PLT 295   Basic Metabolic Panel: Recent Labs  Lab 02/07/17 1343 02/08/17 0818  NA 137 139  K 4.2 4.0  CL 106 107  CO2 23 24  GLUCOSE 199* 107*  BUN 24* 24*  CREATININE 1.58* 1.12  CALCIUM 8.7* 8.6*    Liver Function Tests: No results for input(s): AST, ALT, ALKPHOS, BILITOT, PROT, ALBUMIN in the last 168 hours. No results for input(s): LIPASE, AMYLASE in the last 168 hours. No results for input(s): AMMONIA in the last 168 hours. Coagulation Profile: No results for input(s): INR, PROTIME in the last 168 hours. Cardiac Enzymes: Recent Labs  Lab 02/07/17 1552  TROPONINI <0.03   BNP (last 3 results) No results for input(s): PROBNP in the last 8760 hours. CBG: Recent Labs  Lab 02/08/17 1640 02/08/17 2300 02/09/17 0206 02/09/17 0752 02/09/17 1154  GLUCAP 141* 95 166* 111* 162*   Studies: Dg Eye Foreign Body  Result Date: 02/08/2017 CLINICAL DATA:  Metal working/exposure; clearance prior to MRI EXAM: ORBITS FOR FOREIGN BODY - 2 VIEW COMPARISON:  None. FINDINGS: There is no evidence of metallic foreign body within the orbits. No significant bone abnormality identified. IMPRESSION: No evidence of metallic foreign body within the orbits. Electronically Signed   By: Julian Hy M.D.   On: 02/08/2017 22:06   Mr Brain Wo Contrast  Result Date: 02/08/2017 CLINICAL DATA:  41 y/o M; 2 days of nausea and vomiting in the setting of hypoglycemia. History of diabetes, obesity, hypertension. EXAM: MRI HEAD WITHOUT CONTRAST TECHNIQUE: Multiplanar, multiecho pulse sequences of the brain and surrounding structures were obtained without intravenous contrast.  COMPARISON:  None. FINDINGS: Brain: No acute infarction, hemorrhage, hydrocephalus, extra-axial collection or mass lesion. Vascular: Normal flow voids. Skull and upper cervical spine: Normal marrow signal. Sinuses/Orbits: Small mucous intensity within left anterior ethmoid air cell in the right maxillary sinus. Otherwise negative. Other: None. IMPRESSION: Normal MRI of the brain. Electronically Signed   By: Kristine Garbe M.D.   On: 02/08/2017 22:50    Scheduled Meds: . amLODipine  10 mg Oral Daily  . cloNIDine  0.1 mg Transdermal Weekly  . enoxaparin (LOVENOX) injection  40 mg Subcutaneous Q24H  . hydrALAZINE  25 mg Oral Q8H  . insulin pump   Subcutaneous TID AC, HS, 0200  . meclizine  25 mg Oral TID  . pravastatin  40 mg Oral QPM  . pregabalin  150 mg Oral TID   Continuous Infusions: PRN Meds: hydrALAZINE, ondansetron **OR** ondansetron (ZOFRAN) IV, polyethylene glycol, traMADol  Time spent: 35 minutes  Author: Velvet Bathe [facility] Pager: [phone] 02/09/2017 4:44 PM  If 7PM-7AM, please contact night-coverage at [email or web address], password [facility]

## 2017-02-09 NOTE — Progress Notes (Signed)
Patient's BP was 159/102 pulse 96. Administered Norvasc BP medication. Catapres patch was taken off yesterday at MRI according to patient. Nurse tech rechecked BP 30 minutes after Norvasc was given. BP 163/110. Pharmacy ordered another Catapres patch and going to put patch on now. MD notified. Will continue to monitor.

## 2017-02-09 NOTE — Progress Notes (Signed)
Rechecked patient's BP 30 minutes after placing catapres patch on. BP 164/100 pulse 103. Manual BP 162/102 pulse 100. MD notified. Will continue to monitor.

## 2017-02-10 LAB — GLUCOSE, CAPILLARY
Glucose-Capillary: 156 mg/dL — ABNORMAL HIGH (ref 65–99)
Glucose-Capillary: 230 mg/dL — ABNORMAL HIGH (ref 65–99)
Glucose-Capillary: 189 mg/dL — ABNORMAL HIGH (ref 65–99)

## 2017-02-10 MED ORDER — MECLIZINE HCL 25 MG PO TABS: 25.0000 mg | ORAL_TABLET | Freq: Three times a day (TID) | ORAL | 0 refills | Status: DC

## 2017-02-10 MED ORDER — METOPROLOL TARTRATE 25 MG PO TABS
25.0000 mg | ORAL_TABLET | Freq: Two times a day (BID) | ORAL | Status: DC
  Administered 2017-02-10: 25 mg via ORAL
  Filled 2017-02-10: qty 1

## 2017-02-10 MED ORDER — ACETAMINOPHEN 325 MG PO TABS
650.0000 mg | ORAL_TABLET | Freq: Four times a day (QID) | ORAL | Status: DC | PRN
  Administered 2017-02-10: 650 mg via ORAL
  Filled 2017-02-10: qty 2

## 2017-02-10 NOTE — Progress Notes (Signed)
Occupational Therapy Treatment Patient Details Name: Danny Fuller MRN: [id number] DOB: 03-23-1975 Today's Date: 02/10/2017    History of present illness Pt. with PMH of type 2 diabetes mellitus, morbid obesity; admitted on 02/07/2017, presented with complaint of fatigue, dizziness, blurred vision, was found to have peripheral vertigo, acute on chronic right-sided heart failure   OT comments  Pt progressing towards OT goals this session. Pt participated in vestibular exercises, and performed grooming activities. Pt very motivated to return to PLOF. OT will continue to follow in the acute setting but feel very strongly that Outpatient vestibular PT is the best follow up therapy to improve independence and quality of life for this individual.   Follow Up Recommendations  ( PT - Vestibular from Neuro Outpatient)    Equipment Recommendations  Other (comment)(RW)    Recommendations for Other Services      Precautions / Restrictions Precautions Precautions: Fall Restrictions Weight Bearing Restrictions: No       Mobility Bed Mobility Overal bed mobility: Needs Assistance Bed Mobility: Supine to Sit;Sit to Supine     Supine to sit: Min assist;HOB elevated Sit to supine: Min guard;HOB elevated      Transfers                      Balance                                           ADL either performed or assessed with clinical judgement   ADL Overall ADL's : Needs assistance/impaired     Grooming: Wash/dry hands;Wash/dry face;Set up;Sitting Grooming Details (indicate cue type and reason): EOB                                     Vision       Perception     Praxis      Cognition Arousal/Alertness: Awake/alert Behavior During Therapy: WFL for tasks assessed/performed Overall Cognitive Status: Within Functional Limits for tasks assessed                                          Exercises Exercises:  Other exercises Other Exercises Other Exercises: Pt performed horizontal gaze stabilization exercises 3x for 45 seconds each Other Exercises: Pt performed vertical gaze stabilization exercises 3x for 60 seconds each   Shoulder Instructions       General Comments Pt asked about vision. Vision not formally assessed due to dizziness/spinning with eye shifts. Pt educated that if vision continues to be a problem to follow up with eye MD    Pertinent Vitals/ Pain       Pain Assessment: No/denies pain  Home Living                                          Prior Functioning/Environment              Frequency  Min 2X/week        Progress Toward Goals  OT Goals(current goals can now be found in the care plan section)  Progress towards OT goals: Progressing toward goals  Acute Rehab OT Goals Patient Stated Goal: to get back to normal OT Goal Formulation: With patient Time For Goal Achievement: 02/23/17 Potential to Achieve Goals: Good  Plan Discharge plan remains appropriate;Frequency remains appropriate    Co-evaluation                 AM-PAC PT "6 Clicks" Daily Activity     Outcome Measure   Help from another person eating meals?: None Help from another person taking care of personal grooming?: A Little Help from another person toileting, which includes using toliet, bedpan, or urinal?: A Little Help from another person bathing (including washing, rinsing, drying)?: A Little Help from another person to put on and taking off regular upper body clothing?: A Little Help from another person to put on and taking off regular lower body clothing?: A Lot 6 Click Score: 18    End of Session    OT Visit Diagnosis: Unsteadiness on feet (R26.81);Other abnormalities of gait and mobility (R26.89);Low vision, both eyes (H54.2);BPPV;Dizziness and giddiness (R42) BPPV - Right/Left: Right   Activity Tolerance Patient tolerated treatment well   Patient Left  in bed;with call bell/phone within reach   Nurse Communication Mobility status        Time: 1342-1401 OT Time Calculation (min): 19 min  Charges: OT General Charges $OT Visit: 1 Visit OT Treatments $Therapeutic Exercise: 8-22 mins  {Tyleah Loh OTR/L [place] 02/10/2017, 2:21 PM

## 2017-02-10 NOTE — Discharge Summary (Signed)
Physician Discharge Summary  Danny Fuller:[id number] DOB: 10/14/1975 DOA: 02/07/2017  PCP: [name], MD  Admit date: 02/07/2017 Discharge date: 02/10/2017  Time spent: > 35 minutes  Recommendations for Outpatient Follow-up:  1. Please monitor blood sugars and adjust hypoglycemic agents accordingly 2. Monitor blood pressures and adjust blood pressure medication accordingly as well   Discharge Diagnoses:  Active Problems:   Type 2 diabetes mellitus with diabetic neuropathy, with long-term current use of insulin (HCC)   Essential hypertension   AKI (acute kidney injury) ([place])   Acute kidney failure ([place])   Discharge Condition: stable  Diet recommendation: Heart healthy  Filed Weights   02/07/17 2132 02/08/17 2302 02/09/17 2100  Weight: 121.5 kg (267 lb 13.7 oz) 121.4 kg (267 lb 10.2 oz) 121.5 kg (267 lb 13.7 oz)    History of present illness:  PMH of type 2 diabetes mellitus, morbid obesity; admitted on 02/07/2017, presented with complaint of fatigue, dizziness, blurred vision, was found to have peripheral vertigo, acute on chronic right-sided heart failure.  Hospital Course:  1.  Dizziness, peripheral vertigo. Patient's symptoms appear more likely of peripheral vertigo MRI of brain negative for source PT OT as well as vestibular evaluation. PT recommending outpatient PT for vestibular rehab. Script provided on d/c Scheduled meclizine 25 mg 3 times a day. Will provide script on d/c  2.  Essential hypertension. Accelerated hypertension. - continue prior to admission medication regimen.  3.  Acute kidney injury. Etiology not clear although renal function back to baseline.  Monitor. No decrease in urination no abnormal urinary symptoms.  4.  Acute on chronic right-sided heart failure.  Echocardiogram shows preserved EF, unable to calculated diastolic function. Elevated right-sided pressures as well as pulmonary hypertension. Resolved and patient breathing  comfortably on room air.  5.  Generalized fatigue. Obesity hypoventilation syndrome, obstructive sleep apnea. Patient is already scheduled for outpatient sleep study on February 27, 2017 recommend to continue following up on the same.  6.  Type 2 diabetes mellitus. Uncontrolled. Hemoglobin A1c 10.8 in October 2018. Continuing insulin pump based on recommendation from [facility].  - discontinued patient's prior to admission novolog regimen.   7.  Blood vision. Currently resolved. Most likely associated with his hypo-as well as hyperglycemic episodes.  Monitor.   Procedures:  None  Consultations:  None  Discharge Exam: Vitals:   02/10/17 0900 02/10/17 0905  BP: (!) 147/101   Pulse:  (!) 104  Resp:    Temp:    SpO2:      General: Pt in nad, alert and awake Cardiovascular: rrr, no rubs Respiratory: no increased wob, no wheezes  Discharge Instructions   Discharge Instructions    Call MD for:  severe uncontrolled pain   Complete by:  As directed    Call MD for:  temperature >100.4   Complete by:  As directed    Diet - low sodium heart healthy   Complete by:  As directed    Discharge instructions   Complete by:  As directed    Please ensure you follow up with your primary care physician within the next 1 week to assess your blood pressures and monitor your blood sugars. Continue insulin pump as you were using it here in the hospital should any questions arise please call your endocrinologist or primary care physician.   Increase activity slowly   Complete by:  As directed      Allergies as of 02/10/2017      Reactions  Vancomycin       Medication List    STOP taking these medications   doxycycline 100 MG capsule Commonly known as:  VIBRAMYCIN   hydrALAZINE 25 MG tablet Commonly known as:  APRESOLINE     TAKE these medications   amLODipine-benazepril 10-40 MG capsule Commonly known as:  LOTREL Take 1 capsule by mouth daily.    chlorthalidone 25 MG tablet Commonly known as:  HYGROTON Take 1 tablet (25 mg total) by mouth daily.   cloNIDine 0.1 mg/24hr patch Commonly known as:  CATAPRES - Dosed in mg/24 hr apply 1 patch every week   empagliflozin 25 MG Tabs tablet Commonly known as:  JARDIANCE Take 25 mg by mouth daily.   insulin aspart 100 UNIT/ML FlexPen Commonly known as:  NOVOLOG Inject 50 units daily with Insulin Pump What changed:  Another medication with the same name was removed. Continue taking this medication, and follow the directions you see here.   meclizine 25 MG tablet Commonly known as:  ANTIVERT Take 1 tablet (25 mg total) by mouth 3 (three) times daily.   metFORMIN 500 MG 24 hr tablet Commonly known as:  GLUCOPHAGE-XR Take 4 tablets (2,000 mg total) by mouth daily with supper.   methocarbamol 500 MG tablet Commonly known as:  ROBAXIN Take 1 tablet (500 mg total) by mouth 2 (two) times daily.   pravastatin 40 MG tablet Commonly known as:  PRAVACHOL Take 1 tablet (40 mg total) by mouth every evening.   pregabalin 100 MG capsule Commonly known as:  LYRICA Take 1 tablet in the morning, 1 tablet in the afternoon, and 2 tablets at bedtime What changed:    how much to take  how to take this  when to take this  additional instructions   traMADol 50 MG tablet Commonly known as:  ULTRAM take 2 tablets by mouth every 6 hours if needed for pain   V-GO 40 Kit Use one pump every 24 hours      Allergies  Allergen Reactions  . Vancomycin    Follow-up Information    [name], MD. Call in 1 day.   Specialty:  Family Medicine Why:  to schedule an urgent outpatient follow up appotinemt to re-check the abnormal kidney labs. Contact information: [address] [place] [address] [phone]            The results of significant diagnostics from this hospitalization (including imaging, microbiology, ancillary and laboratory) are listed below for  reference.    Significant Diagnostic Studies: Dg Eye Foreign Body  Result Date: 02/08/2017 CLINICAL DATA:  Metal working/exposure; clearance prior to MRI EXAM: ORBITS FOR FOREIGN BODY - 2 VIEW COMPARISON:  None. FINDINGS: There is no evidence of metallic foreign body within the orbits. No significant bone abnormality identified. IMPRESSION: No evidence of metallic foreign body within the orbits. Electronically Signed   By: Julian Hy M.D.   On: 02/08/2017 22:06   Dg Chest 2 View  Result Date: 02/07/2017 CLINICAL DATA:  Low blood sugars.  Nausea.  Dizziness. EXAM: CHEST  2 VIEW COMPARISON:  01/11/2017.  01/10/2017. FINDINGS: Mediastinum and hilar structures normal. Cardiomegaly with normal pulmonary vascularity. Low lung volumes with mild basilar atelectasis. No pleural effusion or pneumothorax. IMPRESSION: Low lung volumes with mild basilar atelectasis. Cardiomegaly with normal pulmonary vascularity . Electronically Signed   By: Marcello Moores  Register   On: 02/07/2017 16:15   Mr Brain Wo Contrast  Result Date: 02/08/2017 CLINICAL DATA:  41 y/o M; 2  days of nausea and vomiting in the setting of hypoglycemia. History of diabetes, obesity, hypertension. EXAM: MRI HEAD WITHOUT CONTRAST TECHNIQUE: Multiplanar, multiecho pulse sequences of the brain and surrounding structures were obtained without intravenous contrast. COMPARISON:  None. FINDINGS: Brain: No acute infarction, hemorrhage, hydrocephalus, extra-axial collection or mass lesion. Vascular: Normal flow voids. Skull and upper cervical spine: Normal marrow signal. Sinuses/Orbits: Small mucous intensity within left anterior ethmoid air cell in the right maxillary sinus. Otherwise negative. Other: None. IMPRESSION: Normal MRI of the brain. Electronically Signed   By: Kristine Garbe M.D.   On: 02/08/2017 22:50    Microbiology: No results found for this or any previous visit (from the past 240 hour(s)).   Labs: Basic Metabolic  Panel: Recent Labs  Lab 02/07/17 1343 02/08/17 0818  NA 137 139  K 4.2 4.0  CL 106 107  CO2 23 24  GLUCOSE 199* 107*  BUN 24* 24*  CREATININE 1.58* 1.12  CALCIUM 8.7* 8.6*   Liver Function Tests: No results for input(s): AST, ALT, ALKPHOS, BILITOT, PROT, ALBUMIN in the last 168 hours. No results for input(s): LIPASE, AMYLASE in the last 168 hours. No results for input(s): AMMONIA in the last 168 hours. CBC: Recent Labs  Lab 02/07/17 1343  WBC 6.6  HGB 12.2*  HCT 36.0*  MCV 86.5  PLT 326   Cardiac Enzymes: Recent Labs  Lab 02/07/17 1552  TROPONINI <0.03   BNP: BNP (last 3 results) Recent Labs    02/07/17 1552  BNP 169.6*    ProBNP (last 3 results) No results for input(s): PROBNP in the last 8760 hours.  CBG: Recent Labs  Lab 02/09/17 1154 02/09/17 1706 02/10/17 0212 02/10/17 0831 02/10/17 1215  GLUCAP 162* 103* 156* 230* 189*    Signed:  Velvet Bathe MD.  [facility] 02/10/2017, 1:13 PM

## 2017-02-10 NOTE — Progress Notes (Signed)
Physical Therapy Treatment Patient Details Name: Danny Fuller MRN: [id number] DOB: [birth year] Today's Date: 02/10/2017    History of Present Illness Pt. with PMH of type 2 diabetes mellitus, morbid obesity; admitted on 02/07/2017, presented with complaint of fatigue, dizziness, blurred vision, was found to have peripheral vertigo, acute on chronic right-sided heart failure      PT Comments     Session focused on gait training, education on vestibular findings and rehab recommendations with family, and canalith repositioning maneuvers. Patient positive for posterior canal BPPV, treated with Epley twice with no emesis. Patient overall less dizzy than prior sessions, however does not report resolution of symptoms by end of session. Educated that he may feel continued symptoms for rest of day and to follow up with OP neuro PT. Patient stats no questions or concerns at this time and agrees to continue vestibular rehab in OP setting this week.     Follow Up Recommendations  Outpatient PT (Neuro OP PT)     Equipment Recommendations     Recommendations for Other Services       Precautions / Restrictions Precautions Precautions: Fall Restrictions Weight Bearing Restrictions: No    Mobility  Bed Mobility Overal bed mobility: Needs Assistance Bed Mobility: Supine to Sit;Sit to Supine     Supine to sit: Min assist;HOB elevated Sit to supine: Min guard;HOB elevated      Transfers Overall transfer level: Needs assistance Equipment used: 2 person hand held assist Transfers: Sit to/from Stand Sit to Stand: Min assist;+2 physical assistance;+2 safety/equipment         General transfer comment: assist for balance and steady  Ambulation/Gait Ambulation/Gait assistance: Min guard Ambulation Distance (Feet): 100 Feet Assistive device: None Gait Pattern/deviations: Step-through pattern;Decreased stride length;Drifts right/left;Staggering right;Staggering left;Wide base of  support     General Gait Details: Patient with less dizziness, able to ambulate with min guard and mild path deviation left and right. c/o oscillopsia   Stairs            Wheelchair Mobility    Modified Rankin (Stroke Patients Only)       Balance Overall balance assessment: Needs assistance Sitting-balance support: Single extremity supported;Feet supported Sitting balance-Leahy Scale: Fair     Standing balance support: Bilateral upper extremity supported;During functional activity Standing balance-Leahy Scale: Poor Standing balance comment: much more stable with BUE support                            Cognition Arousal/Alertness: Awake/alert Behavior During Therapy: WFL for tasks assessed/performed Overall Cognitive Status: Within Functional Limits for tasks assessed                                        Exercises Other Exercises Other Exercises: Pt performed horizontal gaze stabilization exercises 3x for 45 seconds each Other Exercises: Pt performed vertical gaze stabilization exercises 3x for 60 seconds each    General Comments General comments (skin integrity, edema, etc.): [employer] (+) for BPPV. Performed CRM x2 on patient for Right sided posterior canal. educated family on vestibular findings, and recomendations.        Pertinent Vitals/Pain Pain Assessment: No/denies pain    Home Living                      Prior Function  PT Goals (current goals can now be found in the care plan section) Acute Rehab PT Goals Patient Stated Goal: to get back to normal PT Goal Formulation: With patient Time For Goal Achievement: 02/23/17 Potential to Achieve Goals: Good Progress towards PT goals: Progressing toward goals    Frequency    Min 3X/week      PT Plan Current plan remains appropriate    Co-evaluation              AM-PAC PT "6 Clicks" Daily Activity  Outcome Measure  Difficulty turning  over in bed (including adjusting bedclothes, sheets and blankets)?: A Little Difficulty moving from lying on back to sitting on the side of the bed? : A Little Difficulty sitting down on and standing up from a chair with arms (e.g., wheelchair, bedside commode, etc,.)?: A Little Help needed moving to and from a bed to chair (including a wheelchair)?: A Little Help needed walking in hospital room?: A Little Help needed climbing 3-5 steps with a railing? : A Little 6 Click Score: 18    End of Session Equipment Utilized During Treatment: Gait belt Activity Tolerance: Patient limited by fatigue Patient left: in chair;with call bell/phone within reach Nurse Communication: Mobility status PT Visit Diagnosis: Unsteadiness on feet (R26.81);BPPV;Dizziness and giddiness (R42) BPPV - Right/Left : Right     Time: 1520-1610 PT Time Calculation (min) (ACUTE ONLY): 50 min  Charges:  $Gait Training: 8-22 mins $Self Care/Home Management: 8-22 $Canalith Rep Proc: 8-22 mins                    G Codes:       Reinaldo Berber, PT, DPT Acute Rehab Services Pager: [phone]     Reinaldo Berber 02/10/2017, 4:38 PM

## 2017-02-10 NOTE — Progress Notes (Signed)
Patient discharged to home, AVS reviewed, prescriptions provided, IV removed, telebox returned, patient left floor via wheelchair with staff member

## 2017-02-12 ENCOUNTER — Telehealth: Payer: Self-pay | Admitting: *Deleted

## 2017-02-12 NOTE — Telephone Encounter (Signed)
Transition Care Management Follow-up Telephone Call  Per Discharge Summary: Admit date: 02/07/2017 Discharge date: 02/10/2017  Recommendations for Outpatient Follow-up:  1. Please monitor blood sugars and adjust hypoglycemic agents accordingly 2. Monitor blood pressures and adjust blood pressure medication accordingly as well   Discharge Diagnoses:  Active Problems:   Type 2 diabetes mellitus with diabetic neuropathy, with long-term current use of insulin (HCC)   Essential hypertension   AKI (acute kidney injury) ([place])   Acute kidney failure ([place])   Discharge Condition: stable  Diet recommendation: Heart healthy  --   How have you been since you were released from the hospital? "I've still got some dizziness and blurred vision and some aches and pains and my blood pressure is still running a little high. I don't know if the medication is working or has stopped working or what." Patient reports he is overall feeling better but is still having some elevated BP readings in the 170s/80s-low 100s.   Do you understand why you were in the hospital? yes   Do you understand the discharge instructions? yes   Where were you discharged to? Home   Items Reviewed:  Medications reviewed: yes  Allergies reviewed: yes  Dietary changes reviewed: yes  Referrals reviewed: yes, vestibular rehab for vertigo   Functional Questionnaire:   Activities of Daily Living (ADLs):   He states they are independent in the following: ambulation, feeding, continence, grooming and dressing States they require assistance with the following: bathing and hygiene and toileting. Wife and children are assisting.   Any transportation issues/concerns?: no, wife/kids will drive him to appt   Any patient concerns? Yes, vestibular rehab was recommended to patient but no orders were placed. He would like to discuss w/ PCP at follow-up.   Confirmed importance and date/time of follow-up visits  scheduled yes  Provider Appointment booked with Dr. [name] 02/15/17 @ 9:15am  Confirmed with patient if condition begins to worsen call PCP or go to the ER.  Patient was given the office number and encouraged to call back with question or concerns.  : yes

## 2017-02-12 NOTE — Telephone Encounter (Signed)
Unable to reach patient at time of TCM Call. Left message for patient to return call when available.  

## 2017-02-15 ENCOUNTER — Ambulatory Visit (INDEPENDENT_AMBULATORY_CARE_PROVIDER_SITE_OTHER): Payer: Medicaid Other | Admitting: Family Medicine

## 2017-02-15 ENCOUNTER — Encounter: Payer: Self-pay | Admitting: Family Medicine

## 2017-02-15 VITALS — BP 140/90 | HR 100 | Temp 98.2°F | Wt 286.0 lb

## 2017-02-15 DIAGNOSIS — I10 Essential (primary) hypertension: Secondary | ICD-10-CM

## 2017-02-15 DIAGNOSIS — E1142 Type 2 diabetes mellitus with diabetic polyneuropathy: Secondary | ICD-10-CM | POA: Diagnosis not present

## 2017-02-15 DIAGNOSIS — R42 Dizziness and giddiness: Secondary | ICD-10-CM | POA: Diagnosis not present

## 2017-02-15 DIAGNOSIS — E1140 Type 2 diabetes mellitus with diabetic neuropathy, unspecified: Secondary | ICD-10-CM | POA: Diagnosis not present

## 2017-02-15 DIAGNOSIS — M2550 Pain in unspecified joint: Secondary | ICD-10-CM

## 2017-02-15 DIAGNOSIS — N179 Acute kidney failure, unspecified: Secondary | ICD-10-CM

## 2017-02-15 DIAGNOSIS — Z794 Long term (current) use of insulin: Secondary | ICD-10-CM | POA: Diagnosis not present

## 2017-02-15 LAB — BASIC METABOLIC PANEL
BUN: 26 mg/dL — ABNORMAL HIGH (ref 6–23)
CO2: 27 mEq/L (ref 19–32)
Calcium: 8.9 mg/dL (ref 8.4–10.5)
Chloride: 104 mEq/L (ref 96–112)
Creatinine, Ser: 1.23 mg/dL (ref 0.40–1.50)
GFR: 83.99 mL/min (ref >60.00)
GLUCOSE: 131 mg/dL — AB (ref 70–99)
POTASSIUM: 4.2 meq/L (ref 3.5–5.1)
SODIUM: 138 meq/L (ref 135–145)

## 2017-02-15 MED ORDER — FUROSEMIDE 40 MG PO TABS: 40.0000 mg | ORAL_TABLET | Freq: Every day | ORAL | 5 refills | Status: DC

## 2017-02-15 MED ORDER — MELOXICAM 15 MG PO TABS: 15.0000 mg | ORAL_TABLET | Freq: Every day | ORAL | 5 refills | Status: DC

## 2017-02-15 NOTE — Progress Notes (Signed)
   Subjective:    Patient ID: Danny Fuller, male    DOB: 1975-06-16, 42 y.o.   MRN: [id number]  HPI Here to follow up a hospital stay from 02-07-17 to 02-10-17 for dizziness and blurred vision. He was found to be hypoglycemic, and his basal insulin was adjusted. He is still getting am glucoses in the 50s however. He was diuresed with IV Lasix and now he is taking Clorthalidone as usual. However he is having a lot of leg swelling despite this. No SOB. His renal function was compromised at first but this quickly corrected, and his creatinine went from 1.58 to 1.12. He is taking Meclizine for the dizziness, and this helps somewhat. It was suggested he go to vestibular rehab and he would like to try this. He has not seen an eye doctor for several years.    Review of Systems  Constitutional: Negative.   Eyes: Positive for visual disturbance.  Respiratory: Negative.   Cardiovascular: Positive for leg swelling. Negative for chest pain and palpitations.  Neurological: Positive for dizziness. Negative for light-headedness and headaches.       Objective:   Physical Exam  Constitutional: He is oriented to person, place, and time. He appears well-developed and well-nourished. No distress.  Neck: No thyromegaly present.  Cardiovascular: Normal rate, regular rhythm, normal heart sounds and intact distal pulses.  Pulmonary/Chest: Effort normal and breath sounds normal. No respiratory distress. He has no wheezes. He has no rales.  Musculoskeletal:  4+ edema in both lower legs   Lymphadenopathy:    He has no cervical adenopathy.  Neurological: He is alert and oriented to person, place, and time.          Assessment & Plan:  He recently had a bout of renal failure which resolved, but he still has significant leg edema. We will check another BMET today. Stop Clorthalidone and get back on Lasix 40 mg daily. His diabetes is still labile, and he will follow up with Dr. Dwyane Dee on 02-21-17. His BP is  stable. Her has vertigo, and we will refer him to vestibular rehab. Refer to Dr. Deloria Lair for diabetic eye exam.  [name], MD

## 2017-02-21 ENCOUNTER — Ambulatory Visit (INDEPENDENT_AMBULATORY_CARE_PROVIDER_SITE_OTHER): Payer: Medicaid Other | Admitting: Endocrinology

## 2017-02-21 ENCOUNTER — Encounter: Payer: Self-pay | Admitting: Endocrinology

## 2017-02-21 VITALS — BP 170/102 | HR 101 | Ht 68.5 in | Wt 273.0 lb

## 2017-02-21 DIAGNOSIS — I10 Essential (primary) hypertension: Secondary | ICD-10-CM | POA: Diagnosis not present

## 2017-02-21 DIAGNOSIS — E1165 Type 2 diabetes mellitus with hyperglycemia: Secondary | ICD-10-CM

## 2017-02-21 DIAGNOSIS — Z794 Long term (current) use of insulin: Secondary | ICD-10-CM | POA: Diagnosis not present

## 2017-02-21 LAB — POCT GLYCOSYLATED HEMOGLOBIN (HGB A1C): HEMOGLOBIN A1C: 8.5

## 2017-02-21 MED ORDER — DOXAZOSIN MESYLATE 4 MG PO TABS: 4.0000 mg | ORAL_TABLET | Freq: Every day | ORAL | 2 refills | Status: DC

## 2017-02-21 NOTE — Progress Notes (Signed)
Patient ID: Danny Fuller, male   DOB: Sep 16, 1975, 42 y.o.   MRN: [id number]            Reason for Appointment: Follow-up for Type 2 Diabetes and other issues  Referring physician: Sarajane Fuller   History of Present Illness:          Date of diagnosis of type 2 diabetes mellitus: 2007        Background history:   At diagnosis he was relatively asymptomatic and was started on metformin and glipizide He has been on the same oral hypoglycemic regimen for several years. Review of his A1c indicates it has been markedly increased persistently with the lowest reading in 2015 being 9.1  Recent history:   INSULIN regimen is:  OMNIPOD pump since 01/05/17  BASAL rate 12 AM = 3.0, 4 PM = 6.0, 10 PM = 2.5, total 89 units per day Carbohydrate ratio 1:10, sensitivity 1:15 and target 120 Active insulin 3 hours  Non-insulin hypoglycemic drugs the patient is taking are: Metformin ER 2000 mg daily, Jardiance 25 mg daily  His A1c is now 8.5, previously range 8.4-10.8 recently  Current management, blood sugar patterns and problems identified:  He was switched to the Omnipod pump in November  More recently his blood sugars appear to be much better  However appears that he has on his own increased his BASAL rate up to 6 units an hour after 4 PM even though initially was having a basal rate of 2.0  Also apparently he is starting to get low blood sugar readings early morning occasionally and did not reduce his basal rate to 2.8 as directed on the last visit  Currently he is not consistent with changing his pump before the insulin runs out and is needing to change it about every 2 days; also he thinks he is sometimes having taking relations with the pump  He apparently is sitting relatively lower carbohydrate meals and not too boring much insulin in the form of boluses, daily carbohydrate intake is about 60 g recently  Not clear if he is always bolusing for all of his meals and occasionally may bolus  after eating  Also difficult to know whether he is having adequate both relatively readings with only a few readings after meals, most of his evening meals are late at night        Side effects from medications have been: None  Compliance with the medical regimen: Improving  Glucose monitoring:  with freestyle  Blood Glucose readings by   Mean values apply above for all meters except median for One Touch  PRE-MEAL Fasting Lunch Dinner Bedtime Overall  Glucose range: 66-231  108-202  80-281  82-205    Mean/median: 96     151   Self-care: Meal times are:  Breakfast is at 9-10 am, Lunch-1 PM : Dinner: 8 pm   Typical meal intake: Breakfast is Usually toast and boiled eggs.  Usually getting cold cut sandwiches at lunch, grilled chicken and vegetables/potatoes at dinner.  Snacks are usually chips, crackers                Dietician visit, most recent: 05/02/16               Exercise:  some walking, limited by pain  Weight history: His highest weight in the past has been 378    Wt Readings from Last 3 Encounters:  02/21/17 273 lb (123.8 kg)  02/15/17 286 lb (129.7 kg)  02/09/17 267  lb 13.7 oz (121.5 kg)    Glycemic control:   Lab Results  Component Value Date   HGBA1C 8.5 02/21/2017   HGBA1C 10.8 (H) 11/24/2016   HGBA1C 8.4 (H) 06/19/2016   Lab Results  Component Value Date   MICROALBUR 11.8 (H) 03/23/2016   LDLCALC 51 03/23/2016   CREATININE 1.23 02/15/2017   Lab Results  Component Value Date   MICRALBCREAT 7.2 03/23/2016    Lab Results  Component Value Date   FRUCTOSAMINE 401 (H) 01/10/2017   FRUCTOSAMINE 305 (H) 09/01/2016   FRUCTOSAMINE 334 (H) 07/27/2016      Allergies as of 02/21/2017      Reactions   Vancomycin Other (See Comments)   AKI      Medication List        Accurate as of 02/21/17 11:21 AM. Always use your most recent med list.          amLODipine-benazepril 10-40 MG capsule Commonly known as:  LOTREL Take 1 capsule by mouth daily.     cloNIDine 0.1 mg/24hr patch Commonly known as:  CATAPRES - Dosed in mg/24 hr apply 1 patch every week   doxazosin 4 MG tablet Commonly known as:  CARDURA Take 1 tablet (4 mg total) by mouth daily.   empagliflozin 25 MG Tabs tablet Commonly known as:  JARDIANCE Take 25 mg by mouth daily.   furosemide 40 MG tablet Commonly known as:  LASIX Take 1 tablet (40 mg total) by mouth daily.   insulin aspart 100 UNIT/ML FlexPen Commonly known as:  NOVOLOG Inject 50 units daily with Insulin Pump   meclizine 25 MG tablet Commonly known as:  ANTIVERT Take 1 tablet (25 mg total) by mouth 3 (three) times daily.   meloxicam 15 MG tablet Commonly known as:  MOBIC Take 1 tablet (15 mg total) by mouth daily.   metFORMIN 500 MG 24 hr tablet Commonly known as:  GLUCOPHAGE-XR Take 4 tablets (2,000 mg total) by mouth daily with supper.   methocarbamol 500 MG tablet Commonly known as:  ROBAXIN Take 1 tablet (500 mg total) by mouth 2 (two) times daily.   pravastatin 40 MG tablet Commonly known as:  PRAVACHOL Take 1 tablet (40 mg total) by mouth every evening.   pregabalin 100 MG capsule Commonly known as:  LYRICA Take 1 tablet in the morning, 1 tablet in the afternoon, and 2 tablets at bedtime   traMADol 50 MG tablet Commonly known as:  ULTRAM take 2 tablets by mouth every 6 hours if needed for pain   V-GO 40 Kit Use one pump every 24 hours       Allergies:  Allergies  Allergen Reactions  . Vancomycin Other (See Comments)    AKI    Past Medical History:  Diagnosis Date  . Asthma   . Chronic kidney disease    sees Dr. Jamal Maes    . Diabetes mellitus    sees Dr. Dwyane Dee   . GERD (gastroesophageal reflux disease)   . Headache(784.0)   . Hyperlipidemia   . Hypertension     Past Surgical History:  Procedure Laterality Date  . AMPUTATION Left 07/26/2015   Procedure: AMPUTATION LEFT FIFTH RAY;  Surgeon: [name], MD;  Location: [place];  Service: Orthopedics;   Laterality: Left;  . bilateral hip pins placed    . INCISION AND DRAINAGE PERIRECTAL ABSCESS Right 03/07/2016   Procedure: IRRIGATION AND DEBRIDEMENT PERIRECTAL ABSCESS;  Surgeon: Alphonsa Overall, MD;  Location: [facility];  Service:  General;  Laterality: Right;  . INCISION AND DRAINAGE PERIRECTAL ABSCESS Right 03/09/2016   Procedure: EXAM UNDER ANESTHESIA, IRRIGATION AND DEBRIDEMENT PERIRECTAL ABSCESS;  Surgeon: Johnathan Hausen, MD;  Location: [facility];  Service: General;  Laterality: Right;  Open, betadine packed wound    Family History  Problem Relation Age of Onset  . Arthritis Unknown   . Diabetes Unknown   . Hypertension Unknown   . Hyperlipidemia Unknown   . Stroke Unknown   . Sudden death Unknown   . Diabetes Mother   . Diabetes Sister   . Diabetes Brother   . Heart attack Father        Died ate 37, couple of "heart attacks"     Social History:  reports that he quit smoking about 19 months ago. His smoking use included cigarettes. He has a 10.00 pack-year smoking history. he has never used smokeless tobacco. He reports that he does not drink alcohol or use drugs.   Review of Systems   Lipid history: LDL has been low as also HDL without treatment    Lab Results  Component Value Date   CHOL 110 03/23/2016   HDL 28.70 (L) 03/23/2016   LDLCALC 51 03/23/2016   TRIG 152.0 (H) 03/23/2016   CHOLHDL 4 03/23/2016           Hypertension: On Rx For several years, followed by PCP, is taking Lotrel, clonidine patch  Was started on chlorthalidone on his last visit but this was stopped when he was started on Lasix Not clear if this was affecting his renal function in combination with Jardiance Previously apparently he felt his blood pressure dropped to low with taking hydralazine 25 mg He says when he takes the hydralazine his blood pressure dropped significantly and he feels lightheaded and weak  Recently saw PCP and no changes made He did not replace his clonidine patch last night and  blood pressure is markedly increased today  Home BP is variable: 157/110, 149/87   BP Readings from Last 3 Encounters:  02/21/17 (!) 170/102  02/15/17 140/90  02/10/17 (!) 147/101    EDEMA: He gets swelling of his legs and was admitted because of marked edema and increased blood pressure Now taking Lasix 40 mg daily with improvement in his swelling   Lab Results  Component Value Date   CREATININE 1.23 02/15/2017   BUN 26 (H) 02/15/2017   NA 138 02/15/2017   K 4.2 02/15/2017   CL 104 02/15/2017   CO2 27 02/15/2017      He has sharp pains in his feet from neuropathy,  on Lyrica and he is having Significant pain and numbness     LABS:  Office Visit on 02/21/2017  Component Date Value Ref Range Status  . Hemoglobin A1C 02/21/2017 8.5   Final  Office Visit on 02/15/2017  Component Date Value Ref Range Status  . Sodium 02/15/2017 138  135 - 145 mEq/L Final  . Potassium 02/15/2017 4.2  3.5 - 5.1 mEq/L Final  . Chloride 02/15/2017 104  96 - 112 mEq/L Final  . CO2 02/15/2017 27  19 - 32 mEq/L Final  . Glucose, Bld 02/15/2017 131* 70 - 99 mg/dL Final  . BUN 02/15/2017 26* 6 - 23 mg/dL Final  . Creatinine, Ser 02/15/2017 1.23  0.40 - 1.50 mg/dL Final  . Calcium 02/15/2017 8.9  8.4 - 10.5 mg/dL Final  . GFR 02/15/2017 83.99  >60.00 mL/min Final    Physical Examination:  BP (!) 170/102  Pulse (!) 101   Ht 5' 8.5" (1.74 m)   Wt 273 lb (123.8 kg)   SpO2 97%   BMI 40.91 kg/m   1+ lower leg edema  ASSESSMENT:  Diabetes type 2, uncontrolled With BMI 40 +  He has had long-standing diabetes with  persistently poor control  See history of present illness for detailed discussion of current diabetes management, blood sugar patterns and problems identified   His blood sugars are much better with continuing the Omnipod insulin pump and increasing his basal rate Also he is requiring increased basal rate and total basal insulin is now about 90 units per day He is still  adjusting his basal rate on his own and his taking relatively large amount of basal insulin from 4 to 10 PM at 6 units per hour As above he still has some difficulties with compliance with changing his insulin pump before it runs out and all using consistently and checking readings after meals  HYPERTENSION: control is inconsistent and he has resistant hypertension Not clear if he was benefiting from chlorthalidone that was added Also taking Jardiance and Lasix at this time, may have had relatively increased creatinine with adding chlorthalidone last month   PLAN:    He will reduce his basal rate at midnight down to 2.8 until 6 AM  Basal rate at 6 AM until 4 PM will be 3.0 and will continue evening basal rate unchanged  He will try to change his pump every 48 hours consistently and not run out  Boluses before eating consistently  Advised him not to make too many changes in his settings without calling us  More blood sugars after meals  Start DOXAZOSIN 4 mg daily for hypertension and continue current medications Reminded him to replace his clonidine patch consistently without interruption  Counseling time on subjects discussed in assessment and plan sections is over 50% of today's 25 minute visit    Patient Instructions  Restart patch   [name] 02/21/2017, 11:21 AM   Note: This office note was prepared with Dragon voice recognition system technology. Any transcriptional errors that result from this process are unintentional.

## 2017-02-21 NOTE — Patient Instructions (Addendum)
Restart patch

## 2017-02-27 ENCOUNTER — Ambulatory Visit (HOSPITAL_BASED_OUTPATIENT_CLINIC_OR_DEPARTMENT_OTHER): Payer: Medicaid Other | Attending: Pulmonary Disease | Admitting: Pulmonary Disease

## 2017-02-27 ENCOUNTER — Other Ambulatory Visit: Payer: Self-pay

## 2017-02-27 VITALS — Ht 70.0 in | Wt 270.0 lb

## 2017-02-27 DIAGNOSIS — Z6839 Body mass index (BMI) 39.0-39.9, adult: Secondary | ICD-10-CM | POA: Diagnosis not present

## 2017-02-27 DIAGNOSIS — R0683 Snoring: Secondary | ICD-10-CM | POA: Diagnosis not present

## 2017-02-27 DIAGNOSIS — I10 Essential (primary) hypertension: Secondary | ICD-10-CM | POA: Diagnosis not present

## 2017-02-27 DIAGNOSIS — G4733 Obstructive sleep apnea (adult) (pediatric): Secondary | ICD-10-CM

## 2017-02-27 DIAGNOSIS — E119 Type 2 diabetes mellitus without complications: Secondary | ICD-10-CM | POA: Insufficient documentation

## 2017-02-27 DIAGNOSIS — I493 Ventricular premature depolarization: Secondary | ICD-10-CM | POA: Insufficient documentation

## 2017-02-27 DIAGNOSIS — E669 Obesity, unspecified: Secondary | ICD-10-CM | POA: Diagnosis not present

## 2017-02-27 MED ORDER — FREESTYLE LIBRE 14 DAY SENSOR MISC: 1.0000 | 4 refills | Status: DC

## 2017-03-01 NOTE — Progress Notes (Signed)
Cardiology Office Note   Date:  03/02/2017   ID:  Danny Fuller, DOB 02-05-1976, MRN [id number]  PCP:  [name], MD  Cardiologist:   Minus Breeding, MD    Chief Complaint  Patient presents with  . Chest Pain      History of Present Illness: Danny Fuller is a 42 y.o. male who was referred by Dr. Lorrene Reid for evaluation of palpitations.  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 After the last visit he had a cardiac event monitor that demonstrated rare PVCs.  He had an echo with normal LV function although there was some moderate pulmonary HTN.    The patient was just in the hospital and I reviewed these records for this visit.  He had dizziness.  He had an MRI and was eventually treated with meclizine.  This was in late December.  Prior to this he was in the emergency room with chest discomfort and a negative troponin and normal EKG.  He says he still gets some chest discomfort.  It is his left upper chest.  Seems to be somewhat with movement or lying down and then standing up.  It seems to be brief.  Is not describing substernal discomfort jaw or arm discomfort.  He is not having any new shortness of breath, PND or orthopnea.  Past Medical History:  Diagnosis Date  . Asthma   . Chronic kidney disease    sees Dr. Jamal Maes    . Diabetes mellitus    sees Dr. Dwyane Dee   . GERD (gastroesophageal reflux disease)   . Headache(784.0)   . Hyperlipidemia   . Hypertension     Past Surgical History:  Procedure Laterality Date  . AMPUTATION Left 07/26/2015   Procedure: AMPUTATION LEFT FIFTH RAY;  Surgeon: [name], MD;  Location: [place];  Service: Orthopedics;  Laterality: Left;  . bilateral hip pins placed    . INCISION AND DRAINAGE PERIRECTAL ABSCESS Right 03/07/2016   Procedure: IRRIGATION AND DEBRIDEMENT PERIRECTAL ABSCESS;  Surgeon: Alphonsa Overall, MD;  Location:  [facility];  Service: General;  Laterality: Right;  . INCISION AND DRAINAGE PERIRECTAL ABSCESS Right 03/09/2016   Procedure: EXAM UNDER ANESTHESIA, IRRIGATION AND DEBRIDEMENT PERIRECTAL ABSCESS;  Surgeon: Johnathan Hausen, MD;  Location: [facility];  Service: General;  Laterality: Right;  Open, betadine packed wound     Current Outpatient Medications  Medication Sig Dispense Refill  . amLODipine-benazepril (LOTREL) 10-40 MG capsule Take 1 capsule by mouth daily. 90 capsule 3  . cloNIDine (CATAPRES - DOSED IN MG/24 HR) 0.1 mg/24hr patch apply 1 patch every week 4 patch 2  . Continuous Blood Gluc Sensor (FREESTYLE LIBRE 14 DAY SENSOR) MISC 1 Device by Does not apply route every 14 (fourteen) days. 3 each 4  . doxazosin (CARDURA) 4 MG tablet Take 1 tablet (4 mg total) by mouth daily. 30 tablet 2  . empagliflozin (JARDIANCE) 25 MG TABS tablet Take 25 mg by mouth daily. 30 tablet 3  . furosemide (LASIX) 40 MG tablet Take 1 tablet (40 mg total) by mouth daily. 30 tablet 5  . insulin aspart (NOVOLOG) 100 UNIT/ML FlexPen Inject 50 units daily with Insulin Pump (Patient taking differently: Inject 70 units daily with Insulin Pump) 20 mL 5  . Insulin Disposable Pump (V-GO 40) KIT Use one pump every 24 hours 1 kit 3  .  meclizine (ANTIVERT) 25 MG tablet Take 1 tablet (25 mg total) by mouth 3 (three) times daily. 30 tablet 0  . metFORMIN (GLUCOPHAGE-XR) 500 MG 24 hr tablet Take 4 tablets (2,000 mg total) by mouth daily with supper. 120 tablet 3  . pregabalin (LYRICA) 100 MG capsule Take 1 tablet in the morning, 1 tablet in the afternoon, and 2 tablets at bedtime (Patient taking differently: Take 150 mg by mouth daily. 150 mg tablet in the afternoon, and 300 mg tablets at bedtime) 120 capsule 5  . traMADol (ULTRAM) 50 MG tablet take 2 tablets by mouth every 6 hours if needed for pain 120 tablet 5  . meloxicam (MOBIC) 15 MG tablet Take 1 tablet (15 mg total) by mouth daily. 30 tablet 5  . methocarbamol (ROBAXIN) 500 MG  tablet Take 1 tablet (500 mg total) by mouth 2 (two) times daily. 20 tablet 0  . pravastatin (PRAVACHOL) 40 MG tablet Take 1 tablet (40 mg total) by mouth every evening. 90 tablet 3   No current facility-administered medications for this visit.     Allergies:   Vancomycin   ROS:  Please see the history of present illness.   Otherwise, review of systems are positive for none.   All other systems are reviewed and negative.    PHYSICAL EXAM: VS:  BP (!) 141/84   Pulse (!) 102   Ht 5' 10"  (1.778 m)   Wt 276 lb 9.6 oz (125.5 kg)   SpO2 98%   BMI 39.69 kg/m  , BMI Body mass index is 39.69 kg/m.  GENERAL:  Well appearing HEENT:  Poor dentition.   NECK:  No jugular venous distention, waveform within normal limits, carotid upstroke brisk and symmetric, no bruits, no thyromegaly LUNGS:  Clear to auscultation bilaterally CHEST:  Unremarkable HEART:  PMI not displaced or sustained,S1 and S2 within normal limits, no S3, no S4, no clicks, no rubs, no murmurs ABD:  Flat, positive bowel sounds normal in frequency in pitch, no bruits, no rebound, no guarding, no midline pulsatile mass, no hepatomegaly, no splenomegaly EXT:  2 plus pulses throughout, diffuse mild leg edema, no cyanosis no clubbing, insulin pump.   EKG:  EKG is not ordered today.   Recent Labs: 03/12/2016: Magnesium 1.7 01/10/2017: ALT 16 02/07/2017: B Natriuretic Peptide 169.6; Hemoglobin 12.2; Platelets 326 02/15/2017: BUN 26; Creatinine, Ser 1.23; Potassium 4.2; Sodium 138    Lipid Panel    Component Value Date/Time   CHOL 110 03/23/2016 0947   TRIG 152.0 (H) 03/23/2016 0947   HDL 28.70 (L) 03/23/2016 0947   CHOLHDL 4 03/23/2016 0947   VLDL 30.4 03/23/2016 0947   LDLCALC 51 03/23/2016 0947      Wt Readings from Last 3 Encounters:  03/02/17 276 lb 9.6 oz (125.5 kg)  02/27/17 270 lb (122.5 kg)  02/21/17 273 lb (123.8 kg)      Other studies Reviewed: Additional studies/ records that were reviewed today include:  ED and hospital records. Review of the above records demonstrates:     ASSESSMENT AND PLAN:   PALPITATIONS: He has had no significant dysrhythmias.  No further workup is planned.   CHEST PAIN: This is somewhat atypical but he has significant cardiovascular risk factors and can bring him back for a screening POET (Plain Old Exercise Treadmill).  Of note this will be on medications.    HTN: blood pressure is better controlled and he will continue the meds as listed.  DM: His blood sugar is down the  hemoglobin went from 10.8-8.5.  DYSLIPIDEMIA: I will check a fasting lipid profile.  Goal will be an LDL less than 70.    Current medicines are reviewed at length with the patient today.  The patient does not have concerns regarding medicines.  The following changes have been made:  None  Labs/ tests ordered today include:   Orders Placed This Encounter  Procedures  . Lipid panel  . Exercise Tolerance Test     Disposition:   FU with me in 12 months.     Signed, Minus Breeding, MD  03/02/2017 5:35 PM    [facility]

## 2017-03-02 ENCOUNTER — Encounter: Payer: Self-pay | Admitting: Cardiology

## 2017-03-02 ENCOUNTER — Ambulatory Visit (INDEPENDENT_AMBULATORY_CARE_PROVIDER_SITE_OTHER): Payer: Medicaid Other | Admitting: Cardiology

## 2017-03-02 ENCOUNTER — Telehealth: Payer: Self-pay | Admitting: Pulmonary Disease

## 2017-03-02 VITALS — BP 141/84 | HR 102 | Ht 70.0 in | Wt 276.6 lb

## 2017-03-02 DIAGNOSIS — Z794 Long term (current) use of insulin: Secondary | ICD-10-CM

## 2017-03-02 DIAGNOSIS — E785 Hyperlipidemia, unspecified: Secondary | ICD-10-CM | POA: Insufficient documentation

## 2017-03-02 DIAGNOSIS — R0600 Dyspnea, unspecified: Secondary | ICD-10-CM | POA: Diagnosis not present

## 2017-03-02 DIAGNOSIS — R079 Chest pain, unspecified: Secondary | ICD-10-CM | POA: Diagnosis not present

## 2017-03-02 DIAGNOSIS — E118 Type 2 diabetes mellitus with unspecified complications: Secondary | ICD-10-CM

## 2017-03-02 NOTE — Telephone Encounter (Signed)
Left message for patient to call back for results.  

## 2017-03-02 NOTE — Telephone Encounter (Signed)
OSA 25/h Rx- autoCPAP 5-15 cm, med FF mask, humidity DL & OV in 6 wks

## 2017-03-02 NOTE — Procedures (Signed)
Patient Name: Danny Fuller, Danny Fuller Date: 02/27/2017 Gender: Male D.O.B: 1975/05/31 Age (years): 41 Referring Provider: Kara Mead MD, ABSM Height (inches): 70 Interpreting Physician: Kara Mead MD, ABSM Weight (lbs): 270 RPSGT: Madelon Lips BMI: 39 MRN: [id number] Neck Size: 18.50 <br> <br>   CLINICAL INFORMATION Sleep Study Type: NPSG  Indication for sleep study: Diabetes, Hypertension, Obesity, OSA, Snoring  Epworth Sleepiness Score: 22    SLEEP STUDY TECHNIQUE As per the AASM Manual for the Scoring of Sleep and Associated Events v2.3 (April 2016) with a hypopnea requiring 4% desaturations.  The channels recorded and monitored were frontal, central and occipital EEG, electrooculogram (EOG), submentalis EMG (chin), nasal and oral airflow, thoracic and abdominal wall motion, anterior tibialis EMG, snore microphone, electrocardiogram, and pulse oximetry.  SLEEP ARCHITECTURE The study was initiated at 10:12:52 PM and ended at 4:31:35 AM.  Sleep onset time was 10.9 minutes and the sleep efficiency was 75.1%. The total sleep time was 284.3 minutes.  Stage REM latency was 141.0 minutes.  The patient spent 13.01% of the night in stage N1 sleep, 68.87% in stage N2 sleep, 0.00% in stage N3 and 18.11% in REM.  Alpha intrusion was absent.  Supine sleep was 95.25%.  RESPIRATORY PARAMETERS The overall apnea/hypopnea index (AHI) was 24.9 per hour. There were 30 total apneas, including 30 obstructive, 0 central and 0 mixed apneas. There were 88 hypopneas and 34 RERAs.  The AHI during Stage REM sleep was 48.9 per hour.  AHI while supine was 25.9 per hour.  The mean oxygen saturation was 96.99%. The minimum SpO2 during sleep was 80.00%.  loud snoring was noted during this study.  CARDIAC DATA The 2 lead EKG demonstrated sinus rhythm. The mean heart rate was 94.15 beats per minute. Other EKG findings include: PVCs.   LEG MOVEMENT DATA The total PLMS were 1 with a resulting  PLMS index of 0.21. Associated arousal with leg movement index was 0.0 .  IMPRESSIONS - Moderate obstructive sleep apnea occurred during this study (AHI = 24.9/h). - No significant central sleep apnea occurred during this study (CAI = 0.0/h). - Moderate oxygen desaturation was noted during this study (Min O2 = 80.00%). - The patient snored with loud snoring volume. - EKG findings include PVCs. - Clinically significant periodic limb movements did not occur during sleep. No significant associated arousals.   DIAGNOSIS - Obstructive Sleep Apnea (327.23 [G47.33 ICD-10])   RECOMMENDATIONS - Therapeutic CPAP titration to determine optimal pressure required to alleviate sleep disordered breathing. - Positional therapy avoiding supine position during sleep. - Avoid alcohol, sedatives and other CNS depressants that may worsen sleep apnea and disrupt normal sleep architecture. - Sleep hygiene should be reviewed to assess factors that may improve sleep quality. - Weight management and regular exercise should be initiated or continued if appropriate.    Kara Mead MD Board Certified in [place]

## 2017-03-02 NOTE — Patient Instructions (Signed)
Medication Instructions:  Continue current medications  If you need a refill on your cardiac medications before your next appointment, please call your pharmacy.  Labwork: Fasting Lipids HERE IN OUR OFFICE AT [facility]  Take the provided lab slips for you to take with you to the lab for you blood draw.   You will need to fast. DO NOT EAT OR DRINK PAST MIDNIGHT.   You may go to any [facility] lab that is convenient for you however, we do have a lab in our office that is able to assist you. You do NOT need an appointment for our lab. Once in our office lobby there is a podium to the right of the check-in desk where you are to sign-in and ring a doorbell to alert [name] you are here. Lab is open Monday-Friday from 8:00am to 4:00pm; and is closed for lunch from 12:45p-1:45pm   Testing/Procedures: Your physician has requested that you have an exercise tolerance test. For further information please visit [email or web address]. Please also follow instruction sheet, as given.  Follow-Up: Your physician wants you to follow-up in: 1 Year. You should receive a reminder letter in the mail two months in advance. If you do not receive a letter, please call our office [phone].    Thank you for choosing [facility] at [facility]!!

## 2017-03-08 NOTE — Telephone Encounter (Signed)
Letter has been sent

## 2017-03-13 ENCOUNTER — Telehealth: Payer: Self-pay | Admitting: Pulmonary Disease

## 2017-03-13 ENCOUNTER — Telehealth: Payer: Self-pay | Admitting: Endocrinology

## 2017-03-13 DIAGNOSIS — G4733 Obstructive sleep apnea (adult) (pediatric): Secondary | ICD-10-CM

## 2017-03-13 NOTE — Telephone Encounter (Signed)
Pt stated he was suppose to be receiving a    Continuous Blood Gluc Sensor (FREESTYLE LIBRE 14 DAY SENSOR) MISC   States he has not received one and needs to speak to someone to see what he needs to do to get one.   Please advise

## 2017-03-13 NOTE — Telephone Encounter (Signed)
Called and spoke with pt letting him know the results of his sleep study and that Dr. Elsworth Soho recommended pt starting on cpap  Pt expressed understanding and stated that was fine.  Order sent for pt to begin cpap and ov made for pt to come back in after starting cpap.  Nothing further needed at this current time.

## 2017-03-13 NOTE — Telephone Encounter (Signed)
Patient returned call, CB is [phone]

## 2017-03-14 ENCOUNTER — Telehealth (HOSPITAL_COMMUNITY): Payer: Self-pay

## 2017-03-14 ENCOUNTER — Other Ambulatory Visit: Payer: Self-pay

## 2017-03-14 NOTE — Telephone Encounter (Signed)
This was ordered on 02/27/17 so I called patient to let him know he may need to call the pharmacy and see what the problem is

## 2017-03-14 NOTE — Telephone Encounter (Signed)
Encounter complete. 

## 2017-03-15 ENCOUNTER — Encounter: Payer: Self-pay | Admitting: Podiatry

## 2017-03-15 ENCOUNTER — Encounter: Payer: Self-pay | Admitting: Family Medicine

## 2017-03-15 ENCOUNTER — Other Ambulatory Visit: Payer: Self-pay | Admitting: Endocrinology

## 2017-03-15 ENCOUNTER — Telehealth: Payer: Self-pay

## 2017-03-15 NOTE — Telephone Encounter (Signed)
Patient called today because he is out of insulin- needs prescription in his chart for insulin to go in omnipod only has novolog flexpen on med list and is out of insulin

## 2017-03-15 NOTE — Telephone Encounter (Signed)
I do see the NovoLog prescription with the pump also, 70 units a day

## 2017-03-16 ENCOUNTER — Other Ambulatory Visit: Payer: Self-pay

## 2017-03-16 ENCOUNTER — Ambulatory Visit (HOSPITAL_COMMUNITY)
Admission: RE | Admit: 2017-03-16 | Discharge: 2017-03-16 | Disposition: A | Discharge status: Home / Self Care | Payer: Medicaid Other | Source: Ambulatory Visit | Attending: Cardiology | Admitting: Cardiology

## 2017-03-16 DIAGNOSIS — R079 Chest pain, unspecified: Secondary | ICD-10-CM

## 2017-03-16 LAB — EXERCISE TOLERANCE TEST
CSEPEDS: 0 s
CSEPEW: 7 METS
CSEPHR: 79 %
CSEPPHR: 144 {beats}/min
Exercise duration (min): 6 min
MPHR: 181 {beats}/min
RPE: 18
Rest HR: 100 {beats}/min

## 2017-03-16 MED ORDER — INSULIN ASPART 100 UNIT/ML ~~LOC~~ SOLN: SUBCUTANEOUS | 3 refills | Status: DC

## 2017-03-16 NOTE — Telephone Encounter (Signed)
This has been done.

## 2017-03-20 ENCOUNTER — Other Ambulatory Visit: Payer: Self-pay

## 2017-03-20 MED ORDER — TRAMADOL HCL 50 MG PO TABS: ORAL_TABLET | ORAL | 5 refills | Status: DC

## 2017-03-20 NOTE — Telephone Encounter (Signed)
Call in #[phone] with 5 rf for Tramadol   Called in Rx

## 2017-03-20 NOTE — Telephone Encounter (Signed)
Call in #[phone] with 5 rf 

## 2017-03-21 ENCOUNTER — Other Ambulatory Visit: Payer: Self-pay | Admitting: Endocrinology

## 2017-03-23 ENCOUNTER — Telehealth: Payer: Self-pay

## 2017-03-23 NOTE — Telephone Encounter (Signed)
PA need for Tramadol HCL 50 MG  Placed on Jo Ann's desk

## 2017-03-27 ENCOUNTER — Other Ambulatory Visit: Payer: Self-pay

## 2017-03-27 MED ORDER — CLONIDINE HCL 0.1 MG PO TABS: 0.1000 mg | ORAL_TABLET | Freq: Three times a day (TID) | ORAL | 0 refills | Status: DC

## 2017-03-27 NOTE — Telephone Encounter (Signed)
Changed from Clonidine patches to clonidine tablets.

## 2017-03-30 NOTE — Telephone Encounter (Signed)
PA form completed and faxed to [place] at [phone].

## 2017-04-02 ENCOUNTER — Other Ambulatory Visit (INDEPENDENT_AMBULATORY_CARE_PROVIDER_SITE_OTHER): Payer: Medicaid Other

## 2017-04-02 DIAGNOSIS — E1165 Type 2 diabetes mellitus with hyperglycemia: Secondary | ICD-10-CM

## 2017-04-02 DIAGNOSIS — Z794 Long term (current) use of insulin: Secondary | ICD-10-CM

## 2017-04-02 LAB — COMPREHENSIVE METABOLIC PANEL
ALT: 22 U/L (ref 0–53)
AST: 24 U/L (ref 0–37)
Albumin: 3.7 g/dL (ref 3.5–5.2)
Alkaline Phosphatase: 71 U/L (ref 39–117)
BUN: 24 mg/dL — AB (ref 6–23)
CO2: 27 meq/L (ref 19–32)
Calcium: 9 mg/dL (ref 8.4–10.5)
Chloride: 103 mEq/L (ref 96–112)
Creatinine, Ser: 1.28 mg/dL (ref 0.40–1.50)
GFR: 80.16 mL/min (ref >60.00)
GLUCOSE: 194 mg/dL — AB (ref 70–99)
POTASSIUM: 4 meq/L (ref 3.5–5.1)
SODIUM: 136 meq/L (ref 135–145)
Total Bilirubin: 0.4 mg/dL (ref 0.2–1.2)
Total Protein: 6.9 g/dL (ref 6.0–8.3)

## 2017-04-03 LAB — FRUCTOSAMINE: FRUCTOSAMINE: 280 umol/L (ref 0–285)

## 2017-04-04 ENCOUNTER — Other Ambulatory Visit: Payer: Self-pay

## 2017-04-04 ENCOUNTER — Encounter: Payer: Self-pay | Admitting: Endocrinology

## 2017-04-04 ENCOUNTER — Ambulatory Visit (INDEPENDENT_AMBULATORY_CARE_PROVIDER_SITE_OTHER): Payer: Medicaid Other | Admitting: Endocrinology

## 2017-04-04 VITALS — BP 150/92 | HR 89 | Ht 69.5 in | Wt 278.8 lb

## 2017-04-04 DIAGNOSIS — Z794 Long term (current) use of insulin: Secondary | ICD-10-CM | POA: Diagnosis not present

## 2017-04-04 DIAGNOSIS — I10 Essential (primary) hypertension: Secondary | ICD-10-CM

## 2017-04-04 DIAGNOSIS — E1165 Type 2 diabetes mellitus with hyperglycemia: Secondary | ICD-10-CM

## 2017-04-04 MED ORDER — FREESTYLE LIBRE 14 DAY READER DEVI: 1.0000 | 1 refills | Status: DC

## 2017-04-04 MED ORDER — FREESTYLE LIBRE 14 DAY SENSOR MISC: 1.0000 | 4 refills | Status: DC

## 2017-04-04 MED ORDER — INSULIN LISPRO 200 UNIT/ML ~~LOC~~ SOPN: 60.0000 [IU] | PEN_INJECTOR | Freq: Every day | SUBCUTANEOUS | 3 refills | Status: DC

## 2017-04-04 NOTE — Progress Notes (Signed)
Patient ID: Danny Fuller, male   DOB: 09-Sep-1975, 42 y.o.   MRN: [id number]            Reason for Appointment: Follow-up for Type 2 Diabetes and other issues  Referring physician: Sarajane Jews   History of Present Illness:          Date of diagnosis of type 2 diabetes mellitus: 2007        Background history:   At diagnosis he was relatively asymptomatic and was started on metformin and glipizide He has been on the same oral hypoglycemic regimen for several years. Review of his A1c indicates it has been markedly increased persistently with the lowest reading in 2015 being 9.1  Recent history:   INSULIN regimen is:  OMNIPOD pump since 01/05/17  BASAL rate 12 AM = 3.0, 4 PM = 6.0 , total 96 units daily units per day  Carbohydrate ratio 1:10, sensitivity 1:15 and target 120 Active insulin 3 hours  Non-insulin hypoglycemic drugs the patient is taking are: Metformin ER 2000 mg daily, Jardiance 25 mg daily  His A1c is recently 8.5, previously range 8.4-10.8  However fructosamine is only 280 now  Current management, blood sugar patterns and problems identified:  He was switched to the Omnipod pump in November  Again not clear why his A1c has been higher recently even with continuing the pump his blood sugars at home averaging only about 160  HIGHEST blood sugars are later in the evening after 6 PM but somewhat inconsistent and not clear if these are related to either inadequate bolusing, inadequate basal rate or skipping boluses for food intake  He says he is eating low carbs at all meals and not clear if sugars maybe higher from somewhat higher fat meals at times  FASTING blood sugars are generally fairly good however except for once  He has had occasional difficulty getting his insulin prescription filled on time because of higher insulin requirement and needing to change his Omnipod frequently          Side effects from medications have been: None  Compliance with the  medical regimen: Improving  Glucose monitoring:  with freestyle  Blood Glucose readings by downloaded  Mean values apply above for all meters except median for One Touch  PRE-MEAL Fasting Lunch Dinner Bedtime Overall  Glucose range: 74-200    101-264    Mean/median: 135  191  180  200  160+/-58    POST-MEAL PC Breakfast PC Lunch PC Dinner  Glucose range:     Mean/median:  163     Previous readings:  Mean values apply above for all meters except median for One Touch  PRE-MEAL Fasting Lunch Dinner Bedtime Overall  Glucose range: 66-231  108-202  80-281  82-205    Mean/median: 96     151   Self-care: Meal times are:  Breakfast is at 9-10 am, Lunch-1 PM : Dinner: 8 pm   Typical meal intake: Breakfast is Usually toast and boiled eggs.  Usually getting cold cut sandwiches at lunch, grilled chicken and vegetables/potatoes at dinner.  Snacks are usually chips, crackers                Dietician visit, most recent: 05/02/16               Exercise:  some walking, limited by pain  Weight history: His highest weight in the past has been 378    Wt Readings from Last 3 Encounters:  04/04/17 278 lb  12.8 oz (126.5 kg)  03/02/17 276 lb 9.6 oz (125.5 kg)  02/27/17 270 lb (122.5 kg)    Glycemic control:   Lab Results  Component Value Date   HGBA1C 8.5 02/21/2017   HGBA1C 10.8 (H) 11/24/2016   HGBA1C 8.4 (H) 06/19/2016   Lab Results  Component Value Date   MICROALBUR 11.8 (H) 03/23/2016   LDLCALC 51 03/23/2016   CREATININE 1.28 04/02/2017   Lab Results  Component Value Date   MICRALBCREAT 7.2 03/23/2016    Lab Results  Component Value Date   FRUCTOSAMINE 280 04/02/2017   FRUCTOSAMINE 401 (H) 01/10/2017   FRUCTOSAMINE 305 (H) 09/01/2016      Allergies as of 04/04/2017      Reactions   Vancomycin Other (See Comments)   AKI      Medication List        Accurate as of 04/04/17  4:43 PM. Always use your most recent med list.          amLODipine-benazepril 10-40  MG capsule Commonly known as:  LOTREL Take 1 capsule by mouth daily.   chlorthalidone 25 MG tablet Commonly known as:  HYGROTON take 1 tablet by mouth once daily   cloNIDine 0.1 MG tablet Commonly known as:  CATAPRES Take 1 tablet (0.1 mg total) by mouth 3 (three) times daily.   doxazosin 4 MG tablet Commonly known as:  CARDURA Take 1 tablet (4 mg total) by mouth daily.   empagliflozin 25 MG Tabs tablet Commonly known as:  JARDIANCE Take 25 mg by mouth daily.   FREESTYLE LIBRE 14 DAY READER Devi by Does not apply route.   FREESTYLE LIBRE 14 DAY READER Devi Inject 1 Device into the skin every 14 (fourteen) days.   FREESTYLE LIBRE 14 DAY SENSOR Misc Inject 1 Device into the skin every 14 (fourteen) days.   furosemide 40 MG tablet Commonly known as:  LASIX Take 1 tablet (40 mg total) by mouth daily.   insulin aspart 100 UNIT/ML injection Commonly known as:  NOVOLOG Use 70 units daily in insulin pump   Insulin Lispro 200 UNIT/ML Sopn Commonly known as:  HUMALOG KWIKPEN Inject 60 Units into the skin daily.   meclizine 25 MG tablet Commonly known as:  ANTIVERT Take 1 tablet (25 mg total) by mouth 3 (three) times daily.   meloxicam 15 MG tablet Commonly known as:  MOBIC Take 1 tablet (15 mg total) by mouth daily.   metFORMIN 500 MG 24 hr tablet Commonly known as:  GLUCOPHAGE-XR Take 4 tablets (2,000 mg total) by mouth daily with supper.   methocarbamol 500 MG tablet Commonly known as:  ROBAXIN Take 1 tablet (500 mg total) by mouth 2 (two) times daily.   pravastatin 40 MG tablet Commonly known as:  PRAVACHOL Take 1 tablet (40 mg total) by mouth every evening.   pregabalin 100 MG capsule Commonly known as:  LYRICA Take 1 tablet in the morning, 1 tablet in the afternoon, and 2 tablets at bedtime   traMADol 50 MG tablet Commonly known as:  ULTRAM take 2 tablets by mouth every 6 hours if needed for pain   V-GO 40 Kit Use one pump every 24 hours        Allergies:  Allergies  Allergen Reactions  . Vancomycin Other (See Comments)    AKI    Past Medical History:  Diagnosis Date  . Asthma   . Chronic kidney disease    sees Dr. Jamal Maes    . Diabetes  mellitus    sees Dr. Dwyane Dee   . GERD (gastroesophageal reflux disease)   . Headache(784.0)   . Hyperlipidemia   . Hypertension     Past Surgical History:  Procedure Laterality Date  . AMPUTATION Left 07/26/2015   Procedure: AMPUTATION LEFT FIFTH RAY;  Surgeon: [name], MD;  Location: [facility];  Service: Orthopedics;  Laterality: Left;  . bilateral hip pins placed    . INCISION AND DRAINAGE PERIRECTAL ABSCESS Right 03/07/2016   Procedure: IRRIGATION AND DEBRIDEMENT PERIRECTAL ABSCESS;  Surgeon: Alphonsa Overall, MD;  Location: [facility];  Service: General;  Laterality: Right;  . INCISION AND DRAINAGE PERIRECTAL ABSCESS Right 03/09/2016   Procedure: EXAM UNDER ANESTHESIA, IRRIGATION AND DEBRIDEMENT PERIRECTAL ABSCESS;  Surgeon: Johnathan Hausen, MD;  Location: [facility];  Service: General;  Laterality: Right;  Open, betadine packed wound    Family History  Problem Relation Age of Onset  . Arthritis Unknown   . Diabetes Unknown   . Hypertension Unknown   . Hyperlipidemia Unknown   . Stroke Unknown   . Sudden death Unknown   . Diabetes Mother   . Diabetes Sister   . Diabetes Brother   . Heart attack Father        Died ate 23, couple of "heart attacks"     Social History:  reports that he quit smoking about 20 months ago. His smoking use included cigarettes. He has a 10.00 pack-year smoking history. he has never used smokeless tobacco. He reports that he does not drink alcohol or use drugs.   Review of Systems   He is asking about feeling fatigued and sleepy, mostly for the last 2-3 days.  Also was having some chills but no other symptoms of respiratory illness.  No fever   Lipid history: LDL has been normal    Lab Results  Component Value Date   CHOL 110 03/23/2016    HDL 28.70 (L) 03/23/2016   LDLCALC 51 03/23/2016   TRIG 152.0 (H) 03/23/2016   CHOLHDL 4 03/23/2016           Hypertension: On Rx For several years, followed by PCP, is taking Lotrel, clonidine patch and chlorthalidone  Was started on DOXAZOSIN when blood pressure was significantly high on his last visit He also has his blood pressure checked at home by his wife who has a nursing background He says that he was having much better blood pressure readings at home Normal and also while he was having a stress test However he had difficulty getting this prescription refill and only started back 3-4 days ago and his blood pressure is higher today    BP Readings from Last 3 Encounters:  04/04/17 (!) 150/92  03/02/17 (!) 141/84  02/21/17 (!) 170/102    EDEMA: He gets swelling of his legs and was previously admitted because of marked edema and increased blood pressure Now taking Lasix 40 mg daily with improvement in his swelling   Lab Results  Component Value Date   CREATININE 1.28 04/02/2017   BUN 24 (H) 04/02/2017   NA 136 04/02/2017   K 4.0 04/02/2017   CL 103 04/02/2017   CO2 27 04/02/2017      He has sharp pains in his feet from neuropathy,  on Lyrica and he is having Significant pain and numbness     LABS:  Lab on 04/02/2017  Component Date Value Ref Range Status  . Fructosamine 04/02/2017 280  0 - 285 umol/L Final   Comment: Published  reference interval for apparently healthy subjects between age 76 and 36 is 49 - 285 umol/L and in a poorly controlled diabetic population is 228 - 563 umol/L with a mean of 396 umol/L.   Marland Kitchen Sodium 04/02/2017 136  135 - 145 mEq/L Final  . Potassium 04/02/2017 4.0  3.5 - 5.1 mEq/L Final  . Chloride 04/02/2017 103  96 - 112 mEq/L Final  . CO2 04/02/2017 27  19 - 32 mEq/L Final  . Glucose, Bld 04/02/2017 194* 70 - 99 mg/dL Final  . BUN 04/02/2017 24* 6 - 23 mg/dL Final  . Creatinine, Ser 04/02/2017 1.28  0.40 - 1.50 mg/dL Final  .  Total Bilirubin 04/02/2017 0.4  0.2 - 1.2 mg/dL Final  . Alkaline Phosphatase 04/02/2017 71  39 - 117 U/L Final  . AST 04/02/2017 24  0 - 37 U/L Final  . ALT 04/02/2017 22  0 - 53 U/L Final  . Total Protein 04/02/2017 6.9  6.0 - 8.3 g/dL Final  . Albumin 04/02/2017 3.7  3.5 - 5.2 g/dL Final  . Calcium 04/02/2017 9.0  8.4 - 10.5 mg/dL Final  . GFR 04/02/2017 80.16  >60.00 mL/min Final    Physical Examination:  BP (!) 150/92   Pulse 89   Ht 5' 9.5" (1.765 m)   Wt 278 lb 12.8 oz (126.5 kg)   SpO2 98%   BMI 40.58 kg/m   1+ lower leg edema  ASSESSMENT:  Diabetes type 2, uncontrolled With BMI 40 +   See history of present illness for detailed discussion of current diabetes management, blood sugar patterns and problems identified   His blood sugars are generally better with using the Omnipod insulin pump although using significant amount of insulin He is having mostly high readings in the evenings as discussed above for various reasons  Fructosamine up 288 indicates reasonable control  Most likely some of his higher blood sugars in the evenings are related to inconsistent or inadequate bolusing but possibly also needing a requirement of higher basal rate   HYPERTENSION: control is inconsistent  He thinks it was much better when he was taking doxazosin consistently with his other medications However not taking this regularly recently blood pressure is higher He doesn't monitor at home and will continue to do so and let us know if it is persistently high  PLAN:    He will use of basal rate of 6.5 after 4 PM  Trial of Humalog U-200 to reduce frequency of changing of his insulin pump devices  More consistent monitoring before and after meals  He will need to be bolusing for all meals and snacks and try to do that before eating consistently  He will need to make sure he is avoiding high-fat foods and simple sugars causing blood sugar spikes  Continue Jardiance Counseling  time on diabetes and hypertension management subjects discussed in assessment and plan sections is over 50% of today's 25 minute visit  He will follow-up with his PCP continues to have unusual fatigue and sleepiness which may be possibly from a viral infection  Patient Instructions  Bolus for all meals and snacks  Basal 4 pm 6.5 units   Garmon Dehn 04/04/2017, 4:43 PM   Note: This office note was prepared with Dragon voice recognition system technology. Any transcriptional errors that result from this process are unintentional.

## 2017-04-04 NOTE — Patient Instructions (Addendum)
Bolus for all meals and snacks  Basal 4 pm 6.5 units

## 2017-04-06 ENCOUNTER — Encounter: Payer: Self-pay | Admitting: Endocrinology

## 2017-04-12 ENCOUNTER — Encounter: Payer: Self-pay | Admitting: Podiatry

## 2017-04-21 ENCOUNTER — Encounter: Payer: Self-pay | Admitting: Endocrinology

## 2017-04-23 ENCOUNTER — Telehealth: Payer: Self-pay | Admitting: Family Medicine

## 2017-04-23 NOTE — Telephone Encounter (Signed)
Copied from [place] [id number]. Topic: Quick Communication - See Telephone Encounter >> Apr 23, 2017 11:47 AM Ether Griffins B wrote: CRM for notification. See Telephone encounter for:  [facility] calling in regards to pts tramadol and needing a new PA it was denied bc it was sent in on the wrong sheet. They are needing it on a short acting opioid form.  04/23/17.

## 2017-04-23 NOTE — Telephone Encounter (Signed)
Short acting opoid analgesic form completed and faxed to [phone].

## 2017-04-24 ENCOUNTER — Ambulatory Visit: Payer: Medicaid Other | Admitting: Pulmonary Disease

## 2017-04-26 ENCOUNTER — Encounter: Payer: Self-pay | Admitting: Endocrinology

## 2017-04-26 ENCOUNTER — Encounter: Payer: Self-pay | Admitting: Family Medicine

## 2017-04-26 NOTE — Telephone Encounter (Signed)
Patient sent a MyChart message requesting status update.  Called Medicaid, PA was denied due to missing signature on form.  Rx/forms faxed with provider's signature. Fax confirmation received.

## 2017-04-27 ENCOUNTER — Ambulatory Visit (INDEPENDENT_AMBULATORY_CARE_PROVIDER_SITE_OTHER): Payer: Medicaid Other | Admitting: Neurology

## 2017-04-27 ENCOUNTER — Encounter: Payer: Self-pay | Admitting: Neurology

## 2017-04-27 VITALS — BP 140/90 | HR 90 | Ht 69.5 in | Wt 274.0 lb

## 2017-04-27 DIAGNOSIS — E1142 Type 2 diabetes mellitus with diabetic polyneuropathy: Secondary | ICD-10-CM

## 2017-04-27 DIAGNOSIS — G5621 Lesion of ulnar nerve, right upper limb: Secondary | ICD-10-CM | POA: Diagnosis not present

## 2017-04-27 MED ORDER — NORTRIPTYLINE HCL 10 MG PO CAPS
ORAL_CAPSULE | ORAL | 5 refills | Status: DC
Start: 2017-04-27 — End: 2017-09-28

## 2017-04-27 NOTE — Progress Notes (Signed)
Follow-up Visit   Date: 04/27/17    Danny Fuller MRN: [id number] DOB: [birth year]   Interim History: Danny Fuller is a 42 y.o. left-handed African American male with diabetes mellitus, GERD, and hypertension returning to the clinic for follow-up of diabetic neuropathy.  The patient was accompanied to the clinic by son who also provides collateral information.    History of present illness: He was diagnosed with diabetes in 2002 and began having having numbness of the feet in 2016.  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  Numbness involves the entire feet, which is worse on the left.  He also complain of tingling involving the tingling and lower legs.  He complains of imbalance and has not suffered any falls.  He walks unassisted.  He takes Lyrica 116m twice daily which provides about 35% relief and allows him to sleep.  He has tried gabapentin, which did not provide any relief.   He also complains of numbness and tingling over the upper arm and into his finger tips, which is worse on the right.  He has weakness of the hands and frequently drops objects.  He also complains of achy neck pain.    He also complains of achy bilateral shoulder pain and reduced range of motion, for which he takes tramadol.   He is not working and last worked in 2017 in [employer]  He used to drink heavily on the weekends from the ages of [age] (1 pint liquor).  His mother, brother, sister, and materal grandmother also have diabetic neuropathy.  His mother had three toes amputated and brother had BKA.   UPDATE 04/27/2017:  He is here for follow-up visit with his son.  His EDX showed neuropathy affecting a length-dependent pattern also involving the hands, as well as a right ulnar neuropathy at the elbow.  He continues to have burning pain in the right arm and hand which wakes him up from sleeping. He also has achy pain over the  medial elbow.  Lyrica 1541mTID tends to provide adequate relief of his neuropathic pain in the feet about 75% of the time.  He tries to avoid using tramadol unless pain is severe.  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  He walks independently and has not had any falls.   Medications:  Current Outpatient Medications on File Prior to Visit  Medication Sig Dispense Refill  . amLODipine-benazepril (LOTREL) 10-40 MG capsule Take 1 capsule by mouth daily. 90 capsule 3  . CATAPRES-TTS-1 0.1 MG/24HR patch APPLY 1 PATCH EVERY WEEK  1  . chlorthalidone (HYGROTON) 25 MG tablet take 1 tablet by mouth once daily 30 tablet 1  . Continuous Blood Gluc Receiver (FREESTYLE LIBRE 14 DAY READER) DEVI by Does not apply route.    . Continuous Blood Gluc Receiver (FREESTYLE LIBRE 14 DAY READER) DEVI Inject 1 Device into the skin every 14 (fourteen) days. 1 Device 1  . Continuous Blood Gluc Sensor (FREESTYLE LIBRE 14 DAY SENSOR) MISC Inject 1 Device into the skin every 14 (fourteen) days. 3 each 4  . doxazosin (CARDURA) 4 MG tablet Take 1 tablet (4 mg total) by mouth daily. 30 tablet 2  . empagliflozin (JARDIANCE) 25 MG TABS tablet Take 25 mg by mouth daily. 30 tablet 3  . furosemide (LASIX) 40 MG tablet Take 1 tablet (40 mg  total) by mouth daily. 30 tablet 5  . insulin aspart (NOVOLOG) 100 UNIT/ML injection Use 70 units daily in insulin pump (Patient taking differently: Use 110 units daily in insulin pump) 30 mL 3  . Insulin Disposable Pump (V-GO 40) KIT Use one pump every 24 hours 1 kit 3  . Insulin Lispro (HUMALOG KWIKPEN) 200 UNIT/ML SOPN Inject 60 Units into the skin daily. 27 mL 3  . LYRICA 150 MG capsule   0  . meclizine (ANTIVERT) 25 MG tablet Take 1 tablet (25 mg total) by mouth 3 (three) times daily. 30 tablet 0  . metFORMIN (GLUCOPHAGE-XR) 500 MG 24 hr tablet Take 4 tablets (2,000 mg total) by mouth daily with supper. 120  tablet 3  . traMADol (ULTRAM) 50 MG tablet take 2 tablets by mouth every 6 hours if needed for pain 120 tablet 5  . pravastatin (PRAVACHOL) 40 MG tablet Take 1 tablet (40 mg total) by mouth every evening. 90 tablet 3   No current facility-administered medications on file prior to visit.     Allergies:  Allergies  Allergen Reactions  . Vancomycin Other (See Comments)    AKI    Review of Systems:  CONSTITUTIONAL: No fevers, chills, night sweats, or weight loss.  EYES: No visual changes or eye pain ENT: No hearing changes.  No history of nose bleeds.   RESPIRATORY: No cough, wheezing and shortness of breath.   CARDIOVASCULAR: Negative for chest pain, and palpitations.   GI: Negative for abdominal discomfort, blood in stools or black stools.  No recent change in bowel habits.   GU:  No history of incontinence.   MUSCLOSKELETAL: No history of joint pain or swelling.  No myalgias.   SKIN: Negative for lesions, rash, and itching.   ENDOCRINE: Negative for cold or heat intolerance, polydipsia or goiter.   PSYCH:  No depression or anxiety symptoms.   NEURO: As Above.   Vital Signs:  BP 140/90   Pulse 90   Ht 5' 9.5" (1.765 m)   Wt 274 lb (124.3 kg)   SpO2 98%   BMI 39.88 kg/m    General: Well-appearing  Neurological Exam: MENTAL STATUS including orientation to time, place, person, recent and remote memory, attention span and concentration, language, and fund of knowledge is normal.  Speech is not dysarthric.  CRANIAL NERVES:  Pupils equal round and reactive to light.  Normal conjugate, extra-ocular eye movements in all directions of gaze.  No ptosis.  Face is symmetric. Palate elevates symmetrically.  Tongue is midline.  MOTOR:  Motor strength is 5/5 in all extremities, except distally finger abductors 4/5, toe extensors and flexors 4/5.  No atrophy, fasciculations or abnormal movements.  No pronator drift.  Tone is normal.    MSRs:  Reflexes are 2+/4 in the upper extremities,  1+ at the patella and absent at the ankles.  SENSORY:  Vibration is reduced at the knees bilaterally, absent at the ankles.  Pinprick and temperature is reduced in a gradient pattern from the mid forearms into the hands to temperature and pinprick.  Romberg sign is positive.   COORDINATION/GAIT:  Normal finger-to- nose-finger.  Intact rapid alternating movements bilaterally.  Gait narrow based and stable.   Data: NCS/EMG of the right side 12/14/2016:   1. 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  Overall, these findings are severe in degree electrically. 2. A superimposed right ulnar neuropathy with  slowing across the elbow is likely.  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  Greater than 50% of this time was spent in counseling, explanation of diagnosis, planning of further management, and coordination of care.   Thank you for allowing me to participate in patient's care.  If I can answer any additional questions, I would be pleased to do so.    Sincerely,    Shyleigh Daughtry K. PaPosey ProntoDO

## 2017-04-27 NOTE — Patient Instructions (Addendum)
Start nortriptyline 10mg  at bedtime for 2 week, then increase to 2 tablet at bedtime   Continue Lyrica 150mg  three times daily  Start balance training.  Carry a collapsible cane any time you are on uneven ground  Start occupational therapy for ulnar neuropathy  Call with an update in 6 weeks  Return to clinic in 5 months

## 2017-04-30 ENCOUNTER — Encounter: Payer: Self-pay | Admitting: Endocrinology

## 2017-04-30 NOTE — Telephone Encounter (Signed)
Noted  

## 2017-04-30 NOTE — Telephone Encounter (Signed)
PA approved through 05/26/17.  Per [employer] representative, the PA was only approved for 1 month because 30 days of duration was selected on the PA form. If we submit the form with a longer duration next time, they can approve the PA for longer (up to 6 months).

## 2017-04-30 NOTE — Telephone Encounter (Signed)
See prior note

## 2017-05-01 ENCOUNTER — Ambulatory Visit (INDEPENDENT_AMBULATORY_CARE_PROVIDER_SITE_OTHER): Payer: Medicaid Other | Admitting: Pulmonary Disease

## 2017-05-01 ENCOUNTER — Other Ambulatory Visit: Payer: Self-pay

## 2017-05-01 ENCOUNTER — Encounter: Payer: Self-pay | Admitting: Pulmonary Disease

## 2017-05-01 ENCOUNTER — Telehealth: Payer: Self-pay

## 2017-05-01 DIAGNOSIS — G4733 Obstructive sleep apnea (adult) (pediatric): Secondary | ICD-10-CM

## 2017-05-01 MED ORDER — INSULIN LISPRO 200 UNIT/ML ~~LOC~~ SOPN: 130.0000 [IU] | PEN_INJECTOR | Freq: Every day | SUBCUTANEOUS | 3 refills | Status: DC

## 2017-05-01 NOTE — Telephone Encounter (Signed)
This has been resolved- Danny Fuller spoke with the patient and we changed his prescription

## 2017-05-01 NOTE — Patient Instructions (Signed)
CPAP seems to be working well. Try nasal mask and try to be more consistent at least 4-6 hours every night.  Change settings to auto 5-12 cm

## 2017-05-01 NOTE — Progress Notes (Signed)
   Subjective:    Patient ID: Danny Fuller, male    DOB: 1975/04/20, 41 y.o.   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  OSA was diagnosed after home sleep study and he was started on auto CPAP 5-15 cm with nasal pillows. He seemed to do well initially and download confirms good compliance in January.  However he had a head cold and could not use the nasal pillows after when he started using it he could only get daily for 4 hours every night. Download shows average usage about 4 hours, average pressure of 10 cm with good control of events and minimal leak. He denies dryness of mouth and or problems with nasal congestion.  He has not tried a nasal mask, has only been using nasal pillows. His diabetes is better controlled, his blood pressure is also better controlled now  Significant tests/ events reviewed  HST 02/2017  AHI 25/h   Review of Systems Patient denies significant dyspnea,cough, hemoptysis,  chest pain, palpitations, pedal edema, orthopnea, paroxysmal nocturnal dyspnea, lightheadedness, nausea, vomiting, abdominal or  leg pains      Objective:   Physical Exam Gen. Pleasant, obese, in no distress ENT - no lesions, no post nasal drip Neck: No JVD, no thyromegaly, no carotid bruits Lungs: no use of accessory muscles, no dullness to percussion, decreased without rales or rhonchi  Cardiovascular: Rhythm regular, heart sounds  normal, no murmurs or gallops, no peripheral edema Musculoskeletal: No deformities, no cyanosis or clubbing , no tremors        Assessment & Plan:

## 2017-05-01 NOTE — Telephone Encounter (Signed)
If he is reporting good control with NovoLog and not having frequent insulin pump changes he can continue the same.  His settings will need to be changed if he wants to go to Humalog U-200

## 2017-05-01 NOTE — Assessment & Plan Note (Signed)
CPAP seems to be working well when used.  His compliance has gone down after he had a head cold Try nasal mask and try to be more consistent at least 4-6 hours every night.  Change settings to auto 5-12 cm  Weight loss encouraged, compliance with goal of at least 4-6 hrs every night is the expectation. Advised against medications with sedative side effects Cautioned against driving when sleepy - understanding that sleepiness will vary on a day to day basis

## 2017-05-01 NOTE — Assessment & Plan Note (Signed)
Okay to trial nortriptyline given to him by neurologist

## 2017-05-01 NOTE — Telephone Encounter (Signed)
Patient requesting Novolog refill, but I noticed in your last note you had him on Trial of Humalog U-200 to reduce frequency of changing of his insulin pump devices- please advise what I should send in to the pharmacy and what the dosage is

## 2017-05-02 ENCOUNTER — Telehealth: Payer: Self-pay | Admitting: Nutrition

## 2017-05-02 NOTE — Telephone Encounter (Signed)
Message left on my machine to call him.  Pt. Reports that he went to the drug store and they only gave him 6 pens.  He is taking 80ml (2/3 of one pen every 3 days).  Pharmacist says that that is 6.2/3 pens every 30 days, and he can not give 7pens, because it will be over 30 days.  Medicare does not allow 90 days, only 30days.  Pt. Was called back, and told this.  He reported good understanding that he will need to go back in 27 days to pick up more pens.

## 2017-05-08 ENCOUNTER — Encounter: Payer: Self-pay | Admitting: Family Medicine

## 2017-05-09 ENCOUNTER — Ambulatory Visit (INDEPENDENT_AMBULATORY_CARE_PROVIDER_SITE_OTHER): Payer: Medicaid Other | Admitting: Family Medicine

## 2017-05-09 ENCOUNTER — Encounter: Payer: Self-pay | Admitting: Family Medicine

## 2017-05-09 VITALS — BP 140/90 | HR 103 | Temp 97.9°F | Ht 69.5 in | Wt 279.2 lb

## 2017-05-09 DIAGNOSIS — IMO0002 Reserved for concepts with insufficient information to code with codable children: Secondary | ICD-10-CM

## 2017-05-09 DIAGNOSIS — E1140 Type 2 diabetes mellitus with diabetic neuropathy, unspecified: Secondary | ICD-10-CM

## 2017-05-09 DIAGNOSIS — Z89432 Acquired absence of left foot: Secondary | ICD-10-CM | POA: Diagnosis not present

## 2017-05-09 DIAGNOSIS — E1142 Type 2 diabetes mellitus with diabetic polyneuropathy: Secondary | ICD-10-CM

## 2017-05-09 DIAGNOSIS — I10 Essential (primary) hypertension: Secondary | ICD-10-CM | POA: Diagnosis not present

## 2017-05-09 DIAGNOSIS — Z794 Long term (current) use of insulin: Secondary | ICD-10-CM | POA: Diagnosis not present

## 2017-05-09 MED ORDER — FUROSEMIDE 40 MG PO TABS: 60.0000 mg | ORAL_TABLET | Freq: Every day | ORAL | 5 refills | Status: DC

## 2017-05-09 NOTE — Progress Notes (Signed)
   Subjective:    Patient ID: Danny Fuller, male    DOB: July 17, 1975, 42 y.o.   MRN: [id number]  HPI Here for several issues. He sees Dr. Lorrene Reid for kidney disease and Dr. Dwyane Dee for diabetes, and these have been fairly well controlled. His BP goes up and down and he has chronic leg edema. He currently takes chlorthalidone 25 mg daily and Lasix 40 mg daily. Recently the swelling has been a little worse. No SOB. Also he has been seeing Dr. Paulla Dolly for Podiatry care of his feet, especially the partially amputated left foot. Danny Fuller says his pain has been poorly managed and he would like to get a second opinion about the foot. He takes Lyrica and Dr. Posey Pronto recently added Nortriptyline to this, titrating up from 10 mg a day to 20 mg.    Review of Systems  Constitutional: Negative.   Respiratory: Negative.   Cardiovascular: Positive for leg swelling. Negative for chest pain and palpitations.  Musculoskeletal: Positive for arthralgias.       Objective:   Physical Exam  Constitutional: He is oriented to person, place, and time. He appears well-developed and well-nourished.  Cardiovascular: Normal rate, regular rhythm, normal heart sounds and intact distal pulses.  Pulmonary/Chest: Effort normal and breath sounds normal. No respiratory distress. He has no wheezes. He has no rales.  Musculoskeletal:  2+ edema in both lower legs   Neurological: He is alert and oriented to person, place, and time.          Assessment & Plan:  His diabetes is stable. His HTN is mostly controlled. He has had a bit more edema lately so we will increase the Lasix to 1 and 1/2 tabs a day (60 mg). He wants another opinion on the care of his feet and legs so we will refer him to Dr. Wylene Simmer at [facility].  [name], MD

## 2017-05-15 ENCOUNTER — Other Ambulatory Visit: Payer: Self-pay | Admitting: Cardiology

## 2017-05-15 MED ORDER — PRAVASTATIN SODIUM 40 MG PO TABS: 40.0000 mg | ORAL_TABLET | Freq: Every evening | ORAL | 3 refills | Status: DC

## 2017-05-15 NOTE — Telephone Encounter (Signed)
Rx has been sent to the pharmacy electronically. ° °

## 2017-05-18 ENCOUNTER — Other Ambulatory Visit: Payer: Self-pay | Admitting: Endocrinology

## 2017-05-22 ENCOUNTER — Telehealth: Payer: Self-pay | Admitting: Nutrition

## 2017-05-22 NOTE — Telephone Encounter (Signed)
Opened in error

## 2017-05-31 ENCOUNTER — Other Ambulatory Visit (INDEPENDENT_AMBULATORY_CARE_PROVIDER_SITE_OTHER): Payer: Medicaid Other

## 2017-05-31 ENCOUNTER — Encounter (INDEPENDENT_AMBULATORY_CARE_PROVIDER_SITE_OTHER): Payer: Self-pay | Admitting: Radiology

## 2017-05-31 DIAGNOSIS — E1165 Type 2 diabetes mellitus with hyperglycemia: Secondary | ICD-10-CM | POA: Diagnosis not present

## 2017-05-31 DIAGNOSIS — Z794 Long term (current) use of insulin: Secondary | ICD-10-CM | POA: Diagnosis not present

## 2017-05-31 LAB — BASIC METABOLIC PANEL
BUN: 19 mg/dL (ref 6–23)
CHLORIDE: 103 meq/L (ref 96–112)
CO2: 26 meq/L (ref 19–32)
CREATININE: 1.26 mg/dL (ref 0.40–1.50)
Calcium: 9.3 mg/dL (ref 8.4–10.5)
GFR: 81.57 mL/min (ref >60.00)
GLUCOSE: 212 mg/dL — AB (ref 70–99)
POTASSIUM: 3.8 meq/L (ref 3.5–5.1)
Sodium: 134 mEq/L — ABNORMAL LOW (ref 135–145)

## 2017-05-31 LAB — MICROALBUMIN / CREATININE URINE RATIO
CREATININE, U: 219.1 mg/dL
Microalb Creat Ratio: 27.8 mg/g (ref 0.0–30.0)
Microalb, Ur: 60.9 mg/dL — ABNORMAL HIGH (ref 0.0–1.9)

## 2017-05-31 LAB — HEMOGLOBIN A1C: Hgb A1c MFr Bld: 9.4 % — ABNORMAL HIGH (ref 4.6–6.5)

## 2017-06-04 ENCOUNTER — Ambulatory Visit (INDEPENDENT_AMBULATORY_CARE_PROVIDER_SITE_OTHER): Payer: Medicaid Other | Admitting: Endocrinology

## 2017-06-04 ENCOUNTER — Encounter: Payer: Self-pay | Admitting: Podiatry

## 2017-06-04 ENCOUNTER — Ambulatory Visit: Payer: Medicaid Other | Admitting: Podiatry

## 2017-06-04 ENCOUNTER — Ambulatory Visit (INDEPENDENT_AMBULATORY_CARE_PROVIDER_SITE_OTHER): Payer: Medicaid Other | Admitting: Physician Assistant

## 2017-06-04 ENCOUNTER — Telehealth: Payer: Self-pay | Admitting: Endocrinology

## 2017-06-04 ENCOUNTER — Encounter (INDEPENDENT_AMBULATORY_CARE_PROVIDER_SITE_OTHER): Payer: Self-pay

## 2017-06-04 ENCOUNTER — Ambulatory Visit (INDEPENDENT_AMBULATORY_CARE_PROVIDER_SITE_OTHER): Payer: Medicaid Other

## 2017-06-04 ENCOUNTER — Encounter: Payer: Self-pay | Admitting: Endocrinology

## 2017-06-04 ENCOUNTER — Encounter (INDEPENDENT_AMBULATORY_CARE_PROVIDER_SITE_OTHER): Payer: Self-pay | Admitting: Physician Assistant

## 2017-06-04 VITALS — BP 128/82 | HR 110 | Ht 69.5 in | Wt 272.2 lb

## 2017-06-04 DIAGNOSIS — E1149 Type 2 diabetes mellitus with other diabetic neurological complication: Secondary | ICD-10-CM | POA: Diagnosis not present

## 2017-06-04 DIAGNOSIS — E1165 Type 2 diabetes mellitus with hyperglycemia: Secondary | ICD-10-CM

## 2017-06-04 DIAGNOSIS — I10 Essential (primary) hypertension: Secondary | ICD-10-CM

## 2017-06-04 DIAGNOSIS — D492 Neoplasm of unspecified behavior of bone, soft tissue, and skin: Secondary | ICD-10-CM

## 2017-06-04 DIAGNOSIS — M722 Plantar fascial fibromatosis: Secondary | ICD-10-CM

## 2017-06-04 DIAGNOSIS — M545 Low back pain: Secondary | ICD-10-CM

## 2017-06-04 DIAGNOSIS — E1140 Type 2 diabetes mellitus with diabetic neuropathy, unspecified: Secondary | ICD-10-CM | POA: Diagnosis not present

## 2017-06-04 DIAGNOSIS — Z794 Long term (current) use of insulin: Secondary | ICD-10-CM | POA: Diagnosis not present

## 2017-06-04 DIAGNOSIS — Q828 Other specified congenital malformations of skin: Secondary | ICD-10-CM

## 2017-06-04 MED ORDER — INSULIN ASPART 100 UNIT/ML ~~LOC~~ SOLN: SUBCUTANEOUS | 3 refills | Status: DC

## 2017-06-04 MED ORDER — METHYLPREDNISOLONE 4 MG PO TBPK: ORAL_TABLET | ORAL | 0 refills | Status: DC

## 2017-06-04 MED ORDER — TRIAMCINOLONE ACETONIDE 10 MG/ML IJ SUSP
10.0000 mg | Freq: Once | INTRAMUSCULAR | Status: AC
  Administered 2017-06-04: 10 mg

## 2017-06-04 MED ORDER — VICTOZA 18 MG/3ML ~~LOC~~ SOPN: 1.2000 mg | PEN_INJECTOR | Freq: Every day | SUBCUTANEOUS | 3 refills | Status: DC

## 2017-06-04 NOTE — Telephone Encounter (Signed)
Please try to get freestyle Elenor Legato authorized through his insurance Also please have him check his MyChart message that I sent today regarding methylprednisolone

## 2017-06-04 NOTE — Patient Instructions (Signed)
Check blood sugars on waking up  Daily  Also check blood sugars about 2 hours after a meal and do this after different meals by rotation  Recommended blood sugar levels on waking up is 90-130 and about 2 hours after meal is 130-160  Please bring your blood sugar monitor to each visit, thank you  Start VICTOZA injection as shown once daily at the same time of the day.  Dial the dose to 0.6 mg on the pen for the first week.  You may inject in the stomach, thigh or arm. You may experience nausea in the first few days which usually goes away.  You will feel fullness of the stomach with starting the medication and should try to keep the portions at meals small. After 1 week increase the dose to 1.2mg  daily if no nausea present.    If any questions or concerns are present call the office or the [place] helpline at [phone]. Visit [email or web address] for more useful information

## 2017-06-04 NOTE — Telephone Encounter (Signed)
Left detailed mess informing pt to check his MyChart message from Dr. Dwyane Dee.   He has Medicaid. Do you still want PA for [facility]?

## 2017-06-04 NOTE — Progress Notes (Signed)
Patient ID: Danny Fuller, male   DOB: 06/11/1975, 42 y.o.   MRN: [id number]            Reason for Appointment: Follow-up for Type 2 Diabetes and other issues  Referring physician: Sarajane Jews   History of Present Illness:          Date of diagnosis of type 2 diabetes mellitus: 2007        Background history:   At diagnosis he was relatively asymptomatic and was started on metformin and glipizide He has been on the same oral hypoglycemic regimen for several years. Review of his A1c indicates it has been markedly increased persistently with the lowest reading in 2015 being 9.1  Recent history:   INSULIN regimen is:  OMNIPOD pump since 01/05/17  BASAL rate 12 AM = 1.5, 4 PM = 3.0,  Carbohydrate ratio 1:10, sensitivity 1:15 and target 120 Active insulin 3 hours  Non-insulin hypoglycemic drugs the patient is taking are: Metformin ER 2000 mg daily, Jardiance 25 mg daily  His A1c is recently 9.4 and increased  Current management, blood sugar patterns and problems identified:  He was switched to taking the U-200 insulin his last visit  Although he thinks it may have worked well initially over the last month or so he has had recurrent deactivation of his pump units and because of this he would either have insufficient units or insulin and his blood sugars have been quite erratic  Previously his blood sugars were averaging about 160 and now they are averaging 226  Since his insulin administration has been erratic including boluses because of his not having active pumps all the time he has no consistent blood sugar better  He was told by the company that there may be a problem with using U-200 in the pump but this has not been consistent  Has had blood sugars FASTING as low as 102 but when his blood sugars are higher he has had blood sugars as high as 393  He still continues to take metformin and Jardiance   Also has not checked his blood sugars as frequently and only about twice a  day on an average  He says that freestyle Elenor Legato is not covered by his insurance        Side effects from medications have been: None  Compliance with the medical regimen: Fair  Glucose monitoring:  with freestyle  Blood Glucose readings by download of pump as above   Self-care: Meal times are:  Breakfast is at 9-10 am, Lunch-1 PM : Dinner: 8 pm   Typical meal intake: Breakfast is Usually toast and boiled eggs.  Usually getting cold cut sandwiches at lunch, grilled chicken and vegetables/potatoes at dinner.  Snacks are usually chips, crackers                Dietician visit, most recent: 05/02/16               Exercise:  some walking, limited by pain  Weight history: His highest weight in the past has been 378    Wt Readings from Last 3 Encounters:  06/04/17 272 lb 3.2 oz (123.5 kg)  05/09/17 279 lb 3.2 oz (126.6 kg)  05/01/17 269 lb 12.8 oz (122.4 kg)    Glycemic control:   Lab Results  Component Value Date   HGBA1C 9.4 (H) 05/31/2017   HGBA1C 8.5 02/21/2017   HGBA1C 10.8 (H) 11/24/2016   Lab Results  Component Value Date   MICROALBUR 60.9 (  H) 05/31/2017   LDLCALC 51 03/23/2016   CREATININE 1.26 05/31/2017   Lab Results  Component Value Date   MICRALBCREAT 27.8 05/31/2017    Lab Results  Component Value Date   FRUCTOSAMINE 280 04/02/2017   FRUCTOSAMINE 401 (H) 01/10/2017   FRUCTOSAMINE 305 (H) 09/01/2016      Allergies as of 06/04/2017      Reactions   Vancomycin Other (See Comments)   AKI      Medication List        Accurate as of 06/04/17 11:51 AM. Always use your most recent med list.          amLODipine-benazepril 10-40 MG capsule Commonly known as:  LOTREL Take 1 capsule by mouth daily.   chlorthalidone 25 MG tablet Commonly known as:  HYGROTON take 1 tablet by mouth once daily   cloNIDine 0.1 mg/24hr patch Commonly known as:  CATAPRES - Dosed in mg/24 hr APPLY 1 PATCH EVERY WEEK   doxazosin 4 MG tablet Commonly known as:   CARDURA Take 1 tablet (4 mg total) by mouth daily.   empagliflozin 25 MG Tabs tablet Commonly known as:  JARDIANCE Take 25 mg by mouth daily.   furosemide 40 MG tablet Commonly known as:  LASIX Take 1.5 tablets (60 mg total) by mouth daily.   insulin aspart 100 UNIT/ML injection Commonly known as:  NOVOLOG Use up to 150 units daily via pump   Insulin Lispro 200 UNIT/ML Sopn Commonly known as:  HUMALOG KWIKPEN Inject 130 Units into the skin daily. With insulin pump   LYRICA 150 MG capsule Generic drug:  pregabalin   methylPREDNISolone 4 MG Tbpk tablet Commonly known as:  MEDROL DOSEPAK Take as directed   nortriptyline 10 MG capsule Commonly known as:  PAMELOR Start nortriptyline 10mg  at bedtime for 2 week, then increase to 2 tablet at bedtime   pravastatin 40 MG tablet Commonly known as:  PRAVACHOL Take 1 tablet (40 mg total) by mouth every evening.   traMADol 50 MG tablet Commonly known as:  ULTRAM take 2 tablets by mouth every 6 hours if needed for pain   VICTOZA 18 MG/3ML Sopn Generic drug:  liraglutide Inject 0.2 mLs (1.2 mg total) into the skin daily. Inject once daily at the same time       Allergies:  Allergies  Allergen Reactions  . Vancomycin Other (See Comments)    AKI    Past Medical History:  Diagnosis Date  . Asthma   . Chronic kidney disease    sees Dr. Jamal Maes    . Diabetes mellitus    sees Dr. Dwyane Dee   . GERD (gastroesophageal reflux disease)   . Headache(784.0)   . Hyperlipidemia   . Hypertension     Past Surgical History:  Procedure Laterality Date  . AMPUTATION Left 07/26/2015   Procedure: AMPUTATION LEFT FIFTH RAY;  Surgeon: [name], MD;  Location: [facility];  Service: Orthopedics;  Laterality: Left;  . bilateral hip pins placed    . INCISION AND DRAINAGE PERIRECTAL ABSCESS Right 03/07/2016   Procedure: IRRIGATION AND DEBRIDEMENT PERIRECTAL ABSCESS;  Surgeon: Alphonsa Overall, MD;  Location: [facility];  Service: General;   Laterality: Right;  . INCISION AND DRAINAGE PERIRECTAL ABSCESS Right 03/09/2016   Procedure: EXAM UNDER ANESTHESIA, IRRIGATION AND DEBRIDEMENT PERIRECTAL ABSCESS;  Surgeon: Johnathan Hausen, MD;  Location: [facility];  Service: General;  Laterality: Right;  Open, betadine packed wound    Family History  Problem Relation Age of Onset  .  Arthritis Unknown   . Diabetes Unknown   . Hypertension Unknown   . Hyperlipidemia Unknown   . Stroke Unknown   . Sudden death Unknown   . Diabetes Mother   . Diabetes Sister   . Diabetes Brother   . Heart attack Father        Died ate 44, couple of "heart attacks"     Social History:  reports that he quit smoking about 22 months ago. His smoking use included cigarettes. He has a 10.00 pack-year smoking history. He has never used smokeless tobacco. He reports that he does not drink alcohol or use drugs.   Review of Systems   He is asking about feeling fatigued and sleepy, mostly for the last 2-3 days.  Also was having some chills but no other symptoms of respiratory illness.  No fever   Lipid history: LDL has been normal    Lab Results  Component Value Date   CHOL 110 03/23/2016   HDL 28.70 (L) 03/23/2016   LDLCALC 51 03/23/2016   TRIG 152.0 (H) 03/23/2016   CHOLHDL 4 03/23/2016           Hypertension: On Rx For several years, followed by PCP, is taking Lotrel, clonidine patch and chlorthalidone  Was started on DOXAZOSIN when blood pressure was significantly high in addition to above medication He does monitor at home also    BP Readings from Last 3 Encounters:  06/04/17 128/82  05/09/17 140/90  05/01/17 128/84    EDEMA: He gets swelling of his legs and was previously admitted because of marked edema and increased blood pressure He is taking Lasix 60 mg daily for leg edema   Lab Results  Component Value Date   CREATININE 1.26 05/31/2017   BUN 19 05/31/2017   NA 134 (L) 05/31/2017   K 3.8 05/31/2017   CL 103 05/31/2017   CO2 26  05/31/2017    He has sharp pains in his feet from neuropathy,  on Lyrica to control pain and numbness Also on nortriptyline at bedtime     LABS:  Lab on 05/31/2017  Component Date Value Ref Range Status  . Microalb, Ur 05/31/2017 60.9* 0.0 - 1.9 mg/dL Final  . Creatinine,U 05/31/2017 219.1  mg/dL Final  . Microalb Creat Ratio 05/31/2017 27.8  0.0 - 30.0 mg/g Final  . Sodium 05/31/2017 134* 135 - 145 mEq/L Final  . Potassium 05/31/2017 3.8  3.5 - 5.1 mEq/L Final  . Chloride 05/31/2017 103  96 - 112 mEq/L Final  . CO2 05/31/2017 26  19 - 32 mEq/L Final  . Glucose, Bld 05/31/2017 212* 70 - 99 mg/dL Final  . BUN 05/31/2017 19  6 - 23 mg/dL Final  . Creatinine, Ser 05/31/2017 1.26  0.40 - 1.50 mg/dL Final  . Calcium 05/31/2017 9.3  8.4 - 10.5 mg/dL Final  . GFR 05/31/2017 81.57  >60.00 mL/min Final  . Hgb A1c MFr Bld 05/31/2017 9.4* 4.6 - 6.5 % Final   Glycemic Control Guidelines for People with Diabetes:Non Diabetic:  <6%Goal of Therapy: <7%Additional Action Suggested:  >8%     Physical Examination:  BP 128/82 (BP Location: Left Arm, Patient Position: Sitting, Cuff Size: Large)   Pulse (!) 110   Ht 5' 9.5" (1.765 m)   Wt 272 lb 3.2 oz (123.5 kg)   SpO2 98%   BMI 39.62 kg/m     ASSESSMENT:  Diabetes type 2, uncontrolled independent   See history of present illness for detailed discussion of  current diabetes management, blood sugar patterns and problems identified  His A1c has gone up to 9.4   His blood sugars are worse because of not getting his insulin pump to work consistently and his pods deactivating frequently for unknown reasons and not clear if this is related to his trying to use the Humalog U-200 insulin He generally has an insulin requirement of about 100 units basal insulin  Also has difficulty losing weight He does like the Omnipod pump and wants to continue this However does need to do better with glucose monitoring Currently not able to check his blood  sugars as frequently as when he had the [employer] system which would be beneficial and is adjusting his insulin and mealtime control   HYPERTENSION: control is better now  He will continue his multidrug regimen  Needs follow-up of lipids    PLAN:   He will go back to NovoLog Giving him his pump settings printout with double the basal rates and double the amount for boluses Discussed need to check sugars much more consistently He will need to request large number of insulin pods from his insurance Also will check to see if Freestyle Elenor Legato can be obtained through prior authorization Meantime check sugars 4 times a day He will need to assess his mealtime boluses based on postprandial readings consistently also Regular exercise  Since he has difficulty losing weight and he may benefit with reduced insulin requirement he would be started on a GLP-1 drug Discussed with the patient the nature of GLP-1 drugs, the actions on various organ systems, how they benefit blood glucose control, as well as the benefit of weight loss and  increase satiety . Explained possible side effects especially nausea and vomiting initially; discussed safety information in package insert.  Described the injection technique and dosage titration of Victoza  starting with 0.6 mg once a day at the same time for the first week and then increasing to 1.2 mg if no symptoms of nausea.  Educational brochure on Victoza and co-pay card given  Counseling time on subjects discussed in assessment and plan sections is over 50% of today's 25 minute visit   Patient Instructions  Check blood sugars on waking up  Daily  Also check blood sugars about 2 hours after a meal and do this after different meals by rotation  Recommended blood sugar levels on waking up is 90-130 and about 2 hours after meal is 130-160  Please bring your blood sugar monitor to each visit, thank you  Start VICTOZA injection as shown once daily at the  same time of the day.  Dial the dose to 0.6 mg on the pen for the first week.  You may inject in the stomach, thigh or arm. You may experience nausea in the first few days which usually goes away.  You will feel fullness of the stomach with starting the medication and should try to keep the portions at meals small. After 1 week increase the dose to 1.2mg  daily if no nausea present.    If any questions or concerns are present call the office or the [place] helpline at [phone]. Visit [email or web address] for more useful information      [name] 06/04/2017, 11:51 AM   Note: This office note was prepared with Dragon voice recognition system technology. Any transcriptional errors that result from this process are unintentional.

## 2017-06-04 NOTE — Progress Notes (Signed)
Office Visit Note   Patient: Danny Fuller           Date of Birth: 1975-10-20           MRN: [id number] Visit Date: 06/04/2017              Requested by: [name], MD [place], [place] [id number] PCP: [name], MD   Assessment & Plan: Visit Diagnoses:  1. Acute low back pain, unspecified back pain laterality, with sciatica presence unspecified    Plan: At this point, we will call in the patient a prescription for a steroid taper.  I will also start him in outpatient physical therapy.  If he is not any better in the next 1 to 2 months he will call and let us know we will get an MRI of his lumbar spine.  Otherwise, he will follow-up with [name] on an as-needed basis.  Call with concerns or questions in the meantime.  Follow-Up Instructions: Return if symptoms worsen or fail to improve.   Orders:  Orders Placed This Encounter  Procedures  . XR Lumbar Spine 2-3 Views   Meds ordered this encounter  Medications  . methylPREDNISolone (MEDROL DOSEPAK) 4 MG TBPK tablet    Sig: Take as directed    Dispense:  21 tablet    Refill:  0      Procedures: No procedures performed   Clinical Data: No additional findings.   Subjective: Chief Complaint  Patient presents with  . Lower Back - Pain  . Left Foot - Pain    HPI patient is a pleasant 42 year old gentleman who presents to our clinic today with midline lower back pain radiating to the right side of the back and going down the posterior aspect of the right leg.  This began this past Thursday when he stood up to get out of his car.  He had immediate sharp pain which caused him to fall on his right side.  Since then he has had constant pain worse with lumbar flexion, extension as well as going from seated to standing positions.  He is also having pain trying to sleep at night.  He does have a history of bilateral lower extremity peripheral neuropathy from his diabetes for which he is taking tramadol and  Lyrica.  These of been of minimal help to his back.  No change in the sensation to the bilateral lower extremity since the fall.  No change in bowel or bladder.  No saddle paresthesias.  No previous history of lumbar pathology or epidural steroid injections.  Review of Systems as detailed in HPI.  All his urine negative.   Objective: Vital Signs: There were no vitals taken for this visit.  Physical Exam well-developed well-nourished gentleman no acute distress.  Alert and oriented x3.  Ortho Exam examination of his lumbar spine reveals moderate spinous, right-sided paraspinous tenderness.  Pain is increased with lumbar flexion in the extremes of lumbar extension.  Markedly positive straight leg raise on the right.  Decreased strength throughout.  Specialty Comments:  No specialty comments available.  Imaging: Xr Lumbar Spine 2-3 Views  Result Date: 06/04/2017 No acute structural abnormality    PMFS History: Patient Active Problem List   Diagnosis Date Noted  . Acute low back pain 06/04/2017  . Hyperlipidemia 03/02/2017  . Chest pain 03/02/2017  . Acute kidney failure ([place]) 02/07/2017  . OSA (obstructive sleep apnea) 01/29/2017  . Insomnia 01/29/2017  . PVD (peripheral  vascular disease) ([place]) 11/28/2016  . Palpitation 11/28/2016  . Erectile dysfunction associated with type 2 diabetes mellitus ([place]) 05/08/2016  . Poorly controlled diabetes mellitus ([place]) 03/30/2016  . Necrotizing fasciitis ([place]) 03/07/2016  . Pyelonephritis 03/04/2016  . Acquired contracture of Achilles tendon, left 02/15/2016  . Arthralgia of multiple joints 01/19/2016  . Diabetic polyneuropathy associated with type 2 diabetes mellitus ([place]) 01/18/2016  . Acquired absence of other left toe(s) ([place]) 01/18/2016  . Foot amputation status, left ([place]) 01/04/2016  . Peripheral neuropathy 11/11/2015  . AKI (acute kidney injury) ([place])   . Asthma 07/23/2015  . Chronic headache 07/23/2015  . Erectile disorder due  to medical condition in male 04/23/2015  . BMI 33.0-33.9,adult 04/21/2012  . GERD 04/21/2008  . Type 2 diabetes mellitus with diabetic neuropathy, with long-term current use of insulin ([place]) 11/21/2006  . Essential hypertension 11/21/2006  . LUMBAR STRAIN 11/21/2006   Past Medical History:  Diagnosis Date  . Asthma   . Chronic kidney disease    sees Dr. Jamal Maes    . Diabetes mellitus    sees Dr. Dwyane Dee   . GERD (gastroesophageal reflux disease)   . Headache(784.0)   . Hyperlipidemia   . Hypertension     Family History  Problem Relation Age of Onset  . Arthritis Unknown   . Diabetes Unknown   . Hypertension Unknown   . Hyperlipidemia Unknown   . Stroke Unknown   . Sudden death Unknown   . Diabetes Mother   . Diabetes Sister   . Diabetes Brother   . Heart attack Father        Died ate 33, couple of "heart attacks"     Past Surgical History:  Procedure Laterality Date  . AMPUTATION Left 07/26/2015   Procedure: AMPUTATION LEFT FIFTH RAY;  Surgeon: [name], MD;  Location: [facility];  Service: Orthopedics;  Laterality: Left;  . bilateral hip pins placed    . INCISION AND DRAINAGE PERIRECTAL ABSCESS Right 03/07/2016   Procedure: IRRIGATION AND DEBRIDEMENT PERIRECTAL ABSCESS;  Surgeon: Alphonsa Overall, MD;  Location: [facility];  Service: General;  Laterality: Right;  . INCISION AND DRAINAGE PERIRECTAL ABSCESS Right 03/09/2016   Procedure: EXAM UNDER ANESTHESIA, IRRIGATION AND DEBRIDEMENT PERIRECTAL ABSCESS;  Surgeon: Johnathan Hausen, MD;  Location: [facility];  Service: General;  Laterality: Right;  Open, betadine packed wound   Social History   Occupational History  . Occupation: disability  Tobacco Use  . Smoking status: Former Smoker    Packs/day: 0.50    Years: 20.00    Pack years: 10.00    Types: Cigarettes    Last attempt to quit: 07/23/2015    Years since quitting: 1.8  . Smokeless tobacco: Never Used  Substance and Sexual Activity  . Alcohol use: No    Alcohol/week:  0.0 oz  . Drug use: No  . Sexual activity: Not on file

## 2017-06-04 NOTE — Telephone Encounter (Signed)
I called [employer] PA # [id number] to initiate PA for [facility]. The representative I spoke to states this has to be completed through the patient's DME benefits. She created a PA ticket and states someone will call me back to complete PA.   Ticket # [id number] Pt ID: [id number] N

## 2017-06-05 ENCOUNTER — Telehealth: Payer: Self-pay | Admitting: *Deleted

## 2017-06-05 NOTE — Telephone Encounter (Signed)
[employer] PA is approved until 05/31/18.  PA # [id number] Ref #: [id number]  [place] informed.

## 2017-06-06 NOTE — Progress Notes (Signed)
Subjective:   Patient ID: Danny Fuller, male   DOB: 42 y.o.   MRN: [id number]   HPI Long-term at risk diabetic with history of amputation who presents with pain in the plantar aspect of the right heel and also has lesions on both feet that have remained tender and make it hard for him to walk.  Patient has not been taking regularly sugar and has trouble with his right foot due to the previous amputation   ROS      Objective:  Physical Exam  Neurovascular status is unchanged from previous visit with patient found to have discomfort plantar aspect right heel with inflammation fluid around the medial band.  I did note keratotic lesions bilateral secondary to pressure points and I did note diminishment of sharp dull bilateral     Assessment:  At risk diabetic with neuropathic-like pain and mycotic nail infection with plantar fasciitis and keratotic lesion formation that are painful     Plan:  H&P discussed diabetic education injected plantar fascia 3 mg Kenalog 5 mg Xylocaine and debrided lesions with sharp technique.  Patient will continue to monitor and will be seen back as needed for care

## 2017-06-14 ENCOUNTER — Ambulatory Visit: Payer: Medicaid Other

## 2017-06-25 ENCOUNTER — Ambulatory Visit: Payer: Medicaid Other | Admitting: Physical Therapy

## 2017-07-08 ENCOUNTER — Other Ambulatory Visit: Payer: Self-pay | Admitting: Endocrinology

## 2017-07-12 ENCOUNTER — Other Ambulatory Visit (INDEPENDENT_AMBULATORY_CARE_PROVIDER_SITE_OTHER): Payer: Medicaid Other

## 2017-07-12 DIAGNOSIS — Z794 Long term (current) use of insulin: Secondary | ICD-10-CM | POA: Diagnosis not present

## 2017-07-12 DIAGNOSIS — E1165 Type 2 diabetes mellitus with hyperglycemia: Secondary | ICD-10-CM

## 2017-07-12 LAB — BASIC METABOLIC PANEL
BUN: 23 mg/dL (ref 6–23)
CHLORIDE: 99 meq/L (ref 96–112)
CO2: 24 meq/L (ref 19–32)
Calcium: 9.5 mg/dL (ref 8.4–10.5)
Creatinine, Ser: 1.41 mg/dL (ref 0.40–1.50)
GFR: 71.6 mL/min (ref >60.00)
GLUCOSE: 297 mg/dL — AB (ref 70–99)
POTASSIUM: 4.7 meq/L (ref 3.5–5.1)
SODIUM: 133 meq/L — AB (ref 135–145)

## 2017-07-12 LAB — LIPID PANEL
Cholesterol: 144 mg/dL (ref 0–200)
HDL: 28.9 mg/dL — AB (ref >39.00)
NONHDL: 115.35
Total CHOL/HDL Ratio: 5
Triglycerides: 201 mg/dL — ABNORMAL HIGH (ref 0.0–149.0)
VLDL: 40.2 mg/dL — AB (ref 0.0–40.0)

## 2017-07-12 LAB — LDL CHOLESTEROL, DIRECT: Direct LDL: 89 mg/dL

## 2017-07-13 ENCOUNTER — Other Ambulatory Visit: Payer: Medicaid Other

## 2017-07-13 LAB — FRUCTOSAMINE: Fructosamine: 380 umol/L — ABNORMAL HIGH (ref 0–285)

## 2017-07-15 NOTE — Progress Notes (Signed)
Patient ID: Danny Fuller, male   DOB: Feb 06, 1976, 42 y.o.   MRN: [id number]            Reason for Appointment: Follow-up for Type 2 Diabetes and other issues  Referring physician: Sarajane Jews   History of Present Illness:          Date of diagnosis of type 2 diabetes mellitus: 2007        Background history:   At diagnosis he was relatively asymptomatic and was started on metformin and glipizide He has been on the same oral hypoglycemic regimen for several years. Review of his A1c indicates it has been markedly increased persistently with the lowest reading in 2015 being 9.1  Recent history:   INSULIN regimen is:  OMNIPOD pump since 01/05/17  BASAL rate 12 AM = 4.5, 7 AM = 3.0,  Carbohydrate ratio 1:10, sensitivity 1:15 and target 120 Active insulin 3 hours  Non-insulin hypoglycemic drugs the patient is taking are: Metformin ER 2000 mg daily, Jardiance 25 mg daily, Victoza 1.2 mg daily  His A1c is recently 9.4 and consistently high Fructosamine is now 380  Current management, blood sugar patterns and problems identified:  He was switched to NovoLog on his last visit and his basal rates were increased, he has also gone up more of his overnight basal rate  He is also taking Victoza since his last visit but not clear this is benefiting  However he is still having very frequent issues with his pump not working and getting deactivated frequently  He does not use the pump for several days possibly because of running out of pump units and getting replacements  Also not taking any basal insulin when he does not use the pump  Currently with the 4.5 overnight basal rate his morning sugars are fairly good when he checks them  However checking blood sugars somewhat erratically and sometimes not at all when he is not using the pump  Appears that he does not get adequate correction when he tries to bolus for high readings also however frequently not entering carbohydrates in the form  when he is eating and also probably not bolusing for every meal  He says that freestyle Elenor Legato is not covered by his insurance        Side effects from medications have been: None  Compliance with the medical regimen: Fair  Glucose monitoring:  with freestyle  Blood Glucose readings by download of pump as below  Mean values apply above for all meters except median for One Touch  PRE-MEAL Fasting Lunch Dinner Bedtime Overall  Glucose range:   179-452  195-328  206-492  75-492  Mean/median:  128     167     Self-care: Meal times are:  Breakfast is at 9-10 am, Lunch-1 PM : Dinner: 8 pm   Typical meal intake: Breakfast is Usually toast and boiled eggs.  Usually getting cold cut sandwiches at lunch, grilled chicken and vegetables/potatoes at dinner.  Snacks are usually chips, crackers                Dietician visit, most recent: 05/02/16               Exercise:  some walking, limited by pain  Weight history: His highest weight in the past has been 378    Wt Readings from Last 3 Encounters:  07/16/17 262 lb 12.8 oz (119.2 kg)  06/04/17 272 lb 3.2 oz (123.5 kg)  05/09/17 279 lb 3.2 oz (  126.6 kg)    Glycemic control:   Lab Results  Component Value Date   HGBA1C 9.4 (H) 05/31/2017   HGBA1C 8.5 02/21/2017   HGBA1C 10.8 (H) 11/24/2016   Lab Results  Component Value Date   MICROALBUR 60.9 (H) 05/31/2017   LDLCALC 51 03/23/2016   CREATININE 1.41 07/12/2017   Lab Results  Component Value Date   MICRALBCREAT 27.8 05/31/2017    Lab Results  Component Value Date   FRUCTOSAMINE 380 (H) 07/12/2017   FRUCTOSAMINE 280 04/02/2017   FRUCTOSAMINE 401 (H) 01/10/2017      Allergies as of 07/16/2017      Reactions   Vancomycin Other (See Comments)   AKI      Medication List        Accurate as of 07/16/17 12:25 PM. Always use your most recent med list.          amLODipine-benazepril 10-40 MG capsule Commonly known as:  LOTREL Take 1 capsule by mouth daily.     CATAPRES-TTS-1 0.1 mg/24hr patch Generic drug:  cloNIDine APPLY 1 PATCH EVERY WEEK   chlorthalidone 25 MG tablet Commonly known as:  HYGROTON take 1 tablet by mouth once daily   doxazosin 4 MG tablet Commonly known as:  CARDURA Take 1 tablet (4 mg total) by mouth daily.   empagliflozin 25 MG Tabs tablet Commonly known as:  JARDIANCE Take 25 mg by mouth daily.   furosemide 40 MG tablet Commonly known as:  LASIX Take 1.5 tablets (60 mg total) by mouth daily.   insulin aspart 100 UNIT/ML injection Commonly known as:  NOVOLOG Use up to 150 units daily via pump   Insulin Glargine 100 UNIT/ML Solostar Pen Commonly known as:  LANTUS SOLOSTAR 40 units 2x daily   Insulin Lispro 200 UNIT/ML Sopn Commonly known as:  HUMALOG KWIKPEN Inject 130 Units into the skin daily. With insulin pump   LYRICA 150 MG capsule Generic drug:  pregabalin   methylPREDNISolone 4 MG Tbpk tablet Commonly known as:  MEDROL DOSEPAK Take as directed   nortriptyline 10 MG capsule Commonly known as:  PAMELOR Start nortriptyline 10mg  at bedtime for 2 week, then increase to 2 tablet at bedtime   pravastatin 40 MG tablet Commonly known as:  PRAVACHOL Take 1 tablet (40 mg total) by mouth every evening.   traMADol 50 MG tablet Commonly known as:  ULTRAM take 2 tablets by mouth every 6 hours if needed for pain   VICTOZA 18 MG/3ML Sopn Generic drug:  liraglutide Inject 0.2 mLs (1.2 mg total) into the skin daily. Inject once daily at the same time       Allergies:  Allergies  Allergen Reactions  . Vancomycin Other (See Comments)    AKI    Past Medical History:  Diagnosis Date  . Asthma   . Chronic kidney disease    sees Dr. Jamal Maes    . Diabetes mellitus    sees Dr. Dwyane Dee   . GERD (gastroesophageal reflux disease)   . Headache(784.0)   . Hyperlipidemia   . Hypertension     Past Surgical History:  Procedure Laterality Date  . AMPUTATION Left 07/26/2015   Procedure: AMPUTATION  LEFT FIFTH RAY;  Surgeon: [name], MD;  Location: [facility];  Service: Orthopedics;  Laterality: Left;  . bilateral hip pins placed    . INCISION AND DRAINAGE PERIRECTAL ABSCESS Right 03/07/2016   Procedure: IRRIGATION AND DEBRIDEMENT PERIRECTAL ABSCESS;  Surgeon: Alphonsa Overall, MD;  Location: [facility];  Service: General;  Laterality: Right;  . INCISION AND DRAINAGE PERIRECTAL ABSCESS Right 03/09/2016   Procedure: EXAM UNDER ANESTHESIA, IRRIGATION AND DEBRIDEMENT PERIRECTAL ABSCESS;  Surgeon: Johnathan Hausen, MD;  Location: [facility];  Service: General;  Laterality: Right;  Open, betadine packed wound    Family History  Problem Relation Age of Onset  . Arthritis Unknown   . Diabetes Unknown   . Hypertension Unknown   . Hyperlipidemia Unknown   . Stroke Unknown   . Sudden death Unknown   . Diabetes Mother   . Diabetes Sister   . Diabetes Brother   . Heart attack Father        Died ate 44, couple of "heart attacks"     Social History:  reports that he quit smoking about 1 years ago. His smoking use included cigarettes. He has a 10.00 pack-year smoking history. He has never used smokeless tobacco. He reports that he does not drink alcohol or use drugs.   Review of Systems     Lipid history: LDL has been normal, mild increase in triglycerides present    Lab Results  Component Value Date   CHOL 144 07/12/2017   HDL 28.90 (L) 07/12/2017   LDLCALC 51 03/23/2016   LDLDIRECT 89.0 07/12/2017   TRIG 201.0 (H) 07/12/2017   CHOLHDL 5 07/12/2017           Hypertension: On Rx For several years, followed by PCP, is taking Lotrel, clonidine patch and chlorthalidone  Was started on DOXAZOSIN 4 mg when blood pressure was significantly high in addition to above medication He does monitor at home also Recent readings about 128/80s   BP Readings from Last 3 Encounters:  07/16/17 132/80  06/04/17 128/82  05/09/17 140/90    EDEMA: He has history of significant leg edema He is taking Lasix  60 mg daily for leg edema with control   Lab Results  Component Value Date   CREATININE 1.41 07/12/2017   BUN 23 07/12/2017   NA 133 (L) 07/12/2017   K 4.7 07/12/2017   CL 99 07/12/2017   CO2 24 07/12/2017    He has sharp pains in his feet from neuropathy,  on Lyrica to control pain and numbness Also on nortriptyline at bedtime     LABS:  Lab on 07/12/2017  Component Date Value Ref Range Status  . Cholesterol 07/12/2017 144  0 - 200 mg/dL Final   ATP III Classification       Desirable:  < 200 mg/dL               Borderline High:  200 - 239 mg/dL          High:  > = 240 mg/dL  . Triglycerides 07/12/2017 201.0* 0.0 - 149.0 mg/dL Final   Normal:  <150 mg/dLBorderline High:  150 - 199 mg/dL  . HDL 07/12/2017 28.90* >39.00 mg/dL Final  . VLDL 07/12/2017 40.2* 0.0 - 40.0 mg/dL Final  . Total CHOL/HDL Ratio 07/12/2017 5   Final                  Men          Women1/2 Average Risk     3.4          3.3Average Risk          5.0          4.42X Average Risk          9.6  7.13X Average Risk          15.0          11.0                      . NonHDL 07/12/2017 115.35   Final   NOTE:  Non-HDL goal should be 30 mg/dL higher than patient's LDL goal (i.e. LDL goal of < 70 mg/dL, would have non-HDL goal of < 100 mg/dL)  . Sodium 07/12/2017 133* 135 - 145 mEq/L Final  . Potassium 07/12/2017 4.7  3.5 - 5.1 mEq/L Final  . Chloride 07/12/2017 99  96 - 112 mEq/L Final  . CO2 07/12/2017 24  19 - 32 mEq/L Final  . Glucose, Bld 07/12/2017 297* 70 - 99 mg/dL Final  . BUN 07/12/2017 23  6 - 23 mg/dL Final  . Creatinine, Ser 07/12/2017 1.41  0.40 - 1.50 mg/dL Final  . Calcium 07/12/2017 9.5  8.4 - 10.5 mg/dL Final  . GFR 07/12/2017 71.60  >60.00 mL/min Final  . Fructosamine 07/12/2017 380* 0 - 285 umol/L Final   Comment: Published reference interval for apparently healthy subjects between age 11 and 15 is 22 - 285 umol/L and in a poorly controlled diabetic population is 228 - 563 umol/L with  a mean of 396 umol/L.   Marland Kitchen Direct LDL 07/12/2017 89.0  mg/dL Final   Optimal:  <100 mg/dLNear or Above Optimal:  100-129 mg/dLBorderline High:  130-159 mg/dLHigh:  160-189 mg/dLVery High:  >190 mg/dL    Physical Examination:  BP 132/80 (BP Location: Left Arm, Patient Position: Sitting, Cuff Size: Large)   Pulse 100   Ht 5' 9.5" (1.765 m)   Wt 262 lb 12.8 oz (119.2 kg)   SpO2 100%   BMI 38.25 kg/m   No ankle edema present  ASSESSMENT:  Diabetes type 2, uncontrolled, insulin-dependent   See history of present illness for detailed discussion of current diabetes management, blood sugar patterns and problems identified  His A1c was 9.4 last  He has his poor control of his blood sugars averaging well over 200 now His management has been limited by his not using his pump consistently and partly related to continued technical problems with deactivation of his pods Also he has been somewhat erratic with his diabetes management with not using his pump consistently or taking boluses as directed for all meals and snacks Despite using 82 units of basal insulin he still does not have consistent control throughout the day However when he is using the pump is overnight readings are fairly good Not clear why he is not able to get adequate technical support for his pump which continues to deactivate frequently and he will not use the pump for couple of days after this happens  Also he is not aware that he needs to take some basal insulin when he is not using the pump  HYPERTENSION: control is consistently good now, blood pressure is still fairly good today even without clonidine the last couple of days   He will continue his multidrug regimen  Edema: Controlled and since creatinine is high normal he can reduce his Lasix to 40 mg LIPIDS: LDL is below 100  PLAN:   He will call the local diabetes educator with the pump company to help sort out his technical problems with the  activation Lantus 40 units twice daily on the days he cannot use the pump Needs to check his sugars at least 2-3 times daily consistently We  will try to bolus for every meal and any carbohydrate intake with entering carbohydrates in the pump More consistent diet May start exercise once he has his back and leg problems improved BASAL rate to be 4.5 throughout the day Correction factor I: 10  He will resume his clonidine patch but reduce the doxazosin to half a tablet  Counseling time on subjects discussed in assessment and plan sections is over 50% of today's 25 minute visit   Patient Instructions  Basal 4.5 all day    Lasix 1 pill daily   [name] 07/16/2017, 12:25 PM   Note: This office note was prepared with Dragon voice recognition system technology. Any transcriptional errors that result from this process are unintentional.

## 2017-07-16 ENCOUNTER — Ambulatory Visit (INDEPENDENT_AMBULATORY_CARE_PROVIDER_SITE_OTHER): Payer: Medicaid Other | Admitting: Orthopaedic Surgery

## 2017-07-16 ENCOUNTER — Encounter: Payer: Self-pay | Admitting: Endocrinology

## 2017-07-16 ENCOUNTER — Ambulatory Visit (INDEPENDENT_AMBULATORY_CARE_PROVIDER_SITE_OTHER): Payer: Medicaid Other | Admitting: Endocrinology

## 2017-07-16 ENCOUNTER — Encounter (INDEPENDENT_AMBULATORY_CARE_PROVIDER_SITE_OTHER): Payer: Self-pay | Admitting: Orthopaedic Surgery

## 2017-07-16 VITALS — BP 132/80 | HR 100 | Ht 69.5 in | Wt 262.8 lb

## 2017-07-16 DIAGNOSIS — E1165 Type 2 diabetes mellitus with hyperglycemia: Secondary | ICD-10-CM | POA: Diagnosis not present

## 2017-07-16 DIAGNOSIS — Z794 Long term (current) use of insulin: Secondary | ICD-10-CM

## 2017-07-16 DIAGNOSIS — I10 Essential (primary) hypertension: Secondary | ICD-10-CM | POA: Diagnosis not present

## 2017-07-16 MED ORDER — INSULIN GLARGINE 100 UNIT/ML SOLOSTAR PEN: PEN_INJECTOR | SUBCUTANEOUS | 99 refills | Status: DC

## 2017-07-16 NOTE — Patient Instructions (Addendum)
Basal 4.5 all day    Lasix 1 pill daily

## 2017-07-17 ENCOUNTER — Ambulatory Visit (INDEPENDENT_AMBULATORY_CARE_PROVIDER_SITE_OTHER): Payer: Medicaid Other | Admitting: Orthopaedic Surgery

## 2017-07-18 ENCOUNTER — Ambulatory Visit (INDEPENDENT_AMBULATORY_CARE_PROVIDER_SITE_OTHER): Payer: Medicaid Other | Admitting: Orthopaedic Surgery

## 2017-07-18 ENCOUNTER — Encounter (INDEPENDENT_AMBULATORY_CARE_PROVIDER_SITE_OTHER): Payer: Self-pay | Admitting: Orthopaedic Surgery

## 2017-07-18 DIAGNOSIS — G8929 Other chronic pain: Secondary | ICD-10-CM | POA: Diagnosis not present

## 2017-07-18 DIAGNOSIS — M545 Low back pain: Secondary | ICD-10-CM

## 2017-07-18 NOTE — Progress Notes (Signed)
Office Visit Note   Patient: Danny Fuller           Date of Birth: 1975-12-17           MRN: [id number] Visit Date: 07/18/2017              Requested by: [name], MD [place], [place] [address] PCP: [name], MD   Assessment & Plan: Visit Diagnoses:  1. Chronic low back pain, unspecified back pain laterality, with sciatica presence unspecified     Plan: Patient is continued low back pain and bilateral lower extremity pain.  MRI ordered today to assess response to this.  Follow-up afterwards.  Follow-Up Instructions: Return in about 2 weeks (around 08/01/2017).   Orders:  Orders Placed This Encounter  Procedures  . MR Lumbar Spine w/o contrast   No orders of the defined types were placed in this encounter.     Procedures: No procedures performed   Clinical Data: No additional findings.   Subjective: Chief Complaint  Patient presents with  . Lower Back - Pain    Presents today for continued low back pain and severe pain radiating down both legs.  He is having trouble ambulating due to the pain.   Review of Systems  Constitutional: Negative.   All other systems reviewed and are negative.    Objective: Vital Signs: There were no vitals taken for this visit.  Physical Exam  Constitutional: He is oriented to person, place, and time. He appears well-developed and well-nourished.  Pulmonary/Chest: Effort normal.  Abdominal: Soft.  Neurological: He is alert and oriented to person, place, and time.  Skin: Skin is warm.  Psychiatric: He has a normal mood and affect. His behavior is normal. Judgment and thought content normal.  Nursing note and vitals reviewed.   Ortho Exam Stable exam Specialty Comments:  No specialty comments available.  Imaging: No results found.   PMFS History: Patient Active Problem List   Diagnosis Date Noted  . Acute low back pain 06/04/2017  . Hyperlipidemia 03/02/2017  . Chest pain  03/02/2017  . Acute kidney failure ([place]) 02/07/2017  . OSA (obstructive sleep apnea) 01/29/2017  . Insomnia 01/29/2017  . PVD (peripheral vascular disease) ([place]) 11/28/2016  . Palpitation 11/28/2016  . Erectile dysfunction associated with type 2 diabetes mellitus ([place]) 05/08/2016  . Poorly controlled diabetes mellitus ([place]) 03/30/2016  . Necrotizing fasciitis ([place]) 03/07/2016  . Pyelonephritis 03/04/2016  . Acquired contracture of Achilles tendon, left 02/15/2016  . Arthralgia of multiple joints 01/19/2016  . Diabetic polyneuropathy associated with type 2 diabetes mellitus ([place]) 01/18/2016  . Acquired absence of other left toe(s) ([place]) 01/18/2016  . Foot amputation status, left ([place]) 01/04/2016  . Peripheral neuropathy 11/11/2015  . AKI (acute kidney injury) ([place])   . Asthma 07/23/2015  . Chronic headache 07/23/2015  . Erectile disorder due to medical condition in male 04/23/2015  . BMI 33.0-33.9,adult 04/21/2012  . GERD 04/21/2008  . Type 2 diabetes mellitus with diabetic neuropathy, with long-term current use of insulin ([place]) 11/21/2006  . Essential hypertension 11/21/2006  . LUMBAR STRAIN 11/21/2006   Past Medical History:  Diagnosis Date  . Asthma   . Chronic kidney disease    sees Dr. Jamal Maes    . Diabetes mellitus    sees Dr. Dwyane Dee   . GERD (gastroesophageal reflux disease)   . Headache(784.0)   . Hyperlipidemia   . Hypertension     Family History  Problem Relation  Age of Onset  . Arthritis Unknown   . Diabetes Unknown   . Hypertension Unknown   . Hyperlipidemia Unknown   . Stroke Unknown   . Sudden death Unknown   . Diabetes Mother   . Diabetes Sister   . Diabetes Brother   . Heart attack Father        Died ate 58, couple of "heart attacks"     Past Surgical History:  Procedure Laterality Date  . AMPUTATION Left 07/26/2015   Procedure: AMPUTATION LEFT FIFTH RAY;  Surgeon: [name], MD;  Location: [facility];  Service: Orthopedics;  Laterality: Left;   . bilateral hip pins placed    . INCISION AND DRAINAGE PERIRECTAL ABSCESS Right 03/07/2016   Procedure: IRRIGATION AND DEBRIDEMENT PERIRECTAL ABSCESS;  Surgeon: Alphonsa Overall, MD;  Location: [facility];  Service: General;  Laterality: Right;  . INCISION AND DRAINAGE PERIRECTAL ABSCESS Right 03/09/2016   Procedure: EXAM UNDER ANESTHESIA, IRRIGATION AND DEBRIDEMENT PERIRECTAL ABSCESS;  Surgeon: Johnathan Hausen, MD;  Location: [facility];  Service: General;  Laterality: Right;  Open, betadine packed wound   Social History   Occupational History  . Occupation: disability  Tobacco Use  . Smoking status: Former Smoker    Packs/day: 0.50    Years: 20.00    Pack years: 10.00    Types: Cigarettes    Last attempt to quit: 07/23/2015    Years since quitting: 1.9  . Smokeless tobacco: Never Used  Substance and Sexual Activity  . Alcohol use: No    Alcohol/week: 0.0 oz  . Drug use: No  . Sexual activity: Not on file

## 2017-07-19 ENCOUNTER — Ambulatory Visit (INDEPENDENT_AMBULATORY_CARE_PROVIDER_SITE_OTHER): Payer: Medicaid Other | Admitting: Orthopaedic Surgery

## 2017-07-22 ENCOUNTER — Encounter: Payer: Self-pay | Admitting: Neurology

## 2017-07-24 ENCOUNTER — Encounter: Payer: Self-pay | Admitting: Family Medicine

## 2017-07-24 ENCOUNTER — Ambulatory Visit (INDEPENDENT_AMBULATORY_CARE_PROVIDER_SITE_OTHER): Payer: Medicaid Other | Admitting: Family Medicine

## 2017-07-24 VITALS — BP 124/82 | HR 98 | Temp 97.9°F | Ht 69.5 in | Wt 260.8 lb

## 2017-07-24 DIAGNOSIS — M5441 Lumbago with sciatica, right side: Secondary | ICD-10-CM | POA: Diagnosis not present

## 2017-07-24 DIAGNOSIS — E1140 Type 2 diabetes mellitus with diabetic neuropathy, unspecified: Secondary | ICD-10-CM

## 2017-07-24 DIAGNOSIS — G63 Polyneuropathy in diseases classified elsewhere: Secondary | ICD-10-CM

## 2017-07-24 DIAGNOSIS — I739 Peripheral vascular disease, unspecified: Secondary | ICD-10-CM | POA: Diagnosis not present

## 2017-07-24 DIAGNOSIS — E1142 Type 2 diabetes mellitus with diabetic polyneuropathy: Secondary | ICD-10-CM | POA: Diagnosis not present

## 2017-07-24 DIAGNOSIS — Z794 Long term (current) use of insulin: Secondary | ICD-10-CM | POA: Diagnosis not present

## 2017-07-24 DIAGNOSIS — M5442 Lumbago with sciatica, left side: Secondary | ICD-10-CM | POA: Diagnosis not present

## 2017-07-24 DIAGNOSIS — I10 Essential (primary) hypertension: Secondary | ICD-10-CM

## 2017-07-24 DIAGNOSIS — G8929 Other chronic pain: Secondary | ICD-10-CM

## 2017-07-24 MED ORDER — BENAZEPRIL HCL 40 MG PO TABS: 40.0000 mg | ORAL_TABLET | Freq: Every day | ORAL | 3 refills | Status: DC

## 2017-07-24 NOTE — Progress Notes (Signed)
   Subjective:    Patient ID: Danny Fuller, male    DOB: 26-Dec-1975, 42 y.o.   MRN: [id number]  HPI Here to review several issues. On general his main concern is low back pain that radiates down both legs. He also has diabetic neuropathy in the feet which causes burning pains and tingling, but the sciatic pain is different. He is seeing Dr. Erlinda Hong at [facility] for this. He has tried a steroid taper and PT, but the back pain continues to get worse. He takes Tramadol at night to assist with sleep, but the pain often keeps him awake. He takes Lyrica and Nortriptyline for neuropathy. Dr. Erlinda Hong has ordered a lumbar spine MRI scan which is still pending. Meanwhile his diabetes has not been well controlled. He sees Dr. Dwyane Dee regularly but his insulin pump has been malfunctioning and sometimes he goes for several days at a time with no insulin. His most recent A1c on 05-31-17 was 9.4. His BP has actually been coming down steadily and Rod attributes this to his weight loss. He cannot exercise like he used to but he has been watching his diet. He has lost 26 lbs since January. This has made his BP come down and it often drops too low. He has ended up skipping his BP medications for a day or two frequently, and this keeps it from dropping too low.  Review of Systems  Constitutional: Negative.   Respiratory: Negative.   Cardiovascular: Negative.   Neurological: Positive for weakness, light-headedness and numbness. Negative for dizziness.       Objective:   Physical Exam  Constitutional: He is oriented to person, place, and time. He appears well-developed and well-nourished.  Cardiovascular: Normal rate, regular rhythm, normal heart sounds and intact distal pulses.  Pulmonary/Chest: Effort normal and breath sounds normal. No stridor. No respiratory distress. He has no wheezes. He has no rales.  Musculoskeletal: He exhibits no edema.  Neurological: He is alert and oriented to person, place, and time.           Assessment & Plan:  His HTN has been well controlled and in fact he is on too much medication. We will stop Amlodipine and change Amlodipine-Benazepril to just Benazepril 40 mg daily. Stop the Catapres patches and the Doxazocin completely. Stay on Chlorthalidone daily. Continue to use Lasix as needed for leg swelling. He will follow up with Dr. Dwyane Dee for the diabetes. We await the results of his lumbar spine MRI.  [name], MD

## 2017-07-30 ENCOUNTER — Ambulatory Visit: Payer: Medicaid Other | Admitting: Neurology

## 2017-07-30 ENCOUNTER — Encounter: Payer: Self-pay | Admitting: Neurology

## 2017-07-30 VITALS — BP 130/90 | HR 90 | Ht 69.5 in | Wt 270.0 lb

## 2017-07-30 DIAGNOSIS — R200 Anesthesia of skin: Secondary | ICD-10-CM | POA: Diagnosis not present

## 2017-07-30 DIAGNOSIS — E1142 Type 2 diabetes mellitus with diabetic polyneuropathy: Secondary | ICD-10-CM | POA: Diagnosis not present

## 2017-07-30 NOTE — Progress Notes (Signed)
Follow-up Visit   Date: 07/30/17    Danny Fuller MRN: [id number] DOB: [birth year]   Interim History: Danny Fuller is a 42 y.o. left-handed African American male with diabetes mellitus, GERD, and hypertension returning to the clinic for follow-up of diabetic neuropathy.  The patient was accompanied to the clinic by self.  History of present illness: He was diagnosed with diabetes in 2002 and began having having numbness of the feet in 2016.  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  Numbness involves the entire feet, which is worse on the left.  He also complain of tingling involving the tingling and lower legs.  He complains of imbalance and has not suffered any falls.  He walks unassisted.  He takes Lyrica 150mg  twice daily which provides about 35% relief and allows him to sleep.  He has tried gabapentin, which did not provide any relief.   He also complains of numbness and tingling over the upper arm and into his finger tips, which is worse on the right.  He has weakness of the hands and frequently drops objects.  He also complains of achy neck pain.    He also complains of achy bilateral shoulder pain and reduced range of motion, for which he takes tramadol.   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  He used to drink heavily on the weekends from the ages of [age] (1 pint liquor).  His mother, brother, sister, and materal grandmother also have diabetic neuropathy.  His mother had three toes amputated and brother had BKA.   UPDATE 04/27/2017:   His EDX showed neuropathy affecting a length-dependent pattern also involving the hands, as well as a right ulnar neuropathy at the elbow.  He continues to have burning pain in the right arm and hand which wakes him up from sleeping. He also has achy pain over the medial elbow.  Lyrica 150mg  TID tends to provide adequate relief of his neuropathic pain in  the feet about 75% of the time.  He tries to avoid using tramadol unless pain is severe.  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   UPDATE 07/30/2017:  He scheduled sooner appointment due to worsening numbness of the hands.  He describes on instance where he was unable to feel the temperature of bath water for his son and almost scalded him. He cannot determine temperature of items, such as hot pots/pans and his wife feels that he may endanger himself.  He endorses problems with fine motor tasks.  He has not suffered any falls and has started to use a cane more often.  His pain is controlled on Lyrica 150mg  TID and nortriptyline 20mg  qhs, but there is nothing that provides relief of his numbness.    Medications:  Current Outpatient Medications on File Prior to Visit  Medication Sig Dispense Refill  . benazepril (LOTENSIN) 40 MG tablet Take 1 tablet (40 mg total) by mouth daily. 90 tablet 3  . chlorthalidone (HYGROTON) 25 MG tablet take 1 tablet by mouth once daily 30 tablet 1  . empagliflozin (JARDIANCE) 25 MG TABS tablet Take 25 mg by mouth daily. 30 tablet 3  . furosemide (LASIX) 40 MG tablet Take 1.5 tablets (60 mg total) by mouth daily. 45 tablet 5  . insulin aspart (NOVOLOG) 100 UNIT/ML injection Use up to  150 units daily via pump 30 mL 3  . Insulin Glargine (LANTUS SOLOSTAR) 100 UNIT/ML Solostar Pen 40 units 2x daily 5 pen PRN  . LYRICA 150 MG capsule   0  . nortriptyline (PAMELOR) 10 MG capsule Start nortriptyline 10mg  at bedtime for 2 week, then increase to 2 tablet at bedtime 60 capsule 5  . pravastatin (PRAVACHOL) 40 MG tablet Take 1 tablet (40 mg total) by mouth every evening. 90 tablet 3  . traMADol (ULTRAM) 50 MG tablet take 2 tablets by mouth every 6 hours if needed for pain 120 tablet 5  . VICTOZA 18 MG/3ML SOPN Inject 0.2 mLs (1.2 mg total) into the skin daily. Inject once daily at the same time 2 pen 3     No current facility-administered medications on file prior to visit.     Allergies:  Allergies  Allergen Reactions  . Vancomycin Other (See Comments)    AKI    Review of Systems:  CONSTITUTIONAL: No fevers, chills, night sweats, or weight loss.  EYES: No visual changes or eye pain ENT: No hearing changes.  No history of nose bleeds.   RESPIRATORY: No cough, wheezing and shortness of breath.   CARDIOVASCULAR: Negative for chest pain, and palpitations.   GI: Negative for abdominal discomfort, blood in stools or black stools.  No recent change in bowel habits.   GU:  No history of incontinence.   MUSCLOSKELETAL: No history of joint pain or swelling.  No myalgias.   SKIN: Negative for lesions, rash, and itching.   ENDOCRINE: Negative for cold or heat intolerance, polydipsia or goiter.   PSYCH:  No depression or anxiety symptoms.   NEURO: As Above.   Vital Signs:  BP 130/90   Pulse 90   Ht 5' 9.5" (1.765 m)   Wt 270 lb (122.5 kg)   SpO2 96%   BMI 39.30 kg/m    General Medical Exam:   General:  Well appearing, comfortable  Neck: No carotid bruits. Respiratory:  Clear to auscultation, good air entry bilaterally.   Cardiac:  Regular rate and rhythm, no murmur.   Ext;  No edema  Neurological Exam: MENTAL STATUS including orientation to time, place, person, recent and remote memory, attention span and concentration, language, and fund of knowledge is normal.  Speech is not dysarthric.  CRANIAL NERVES:  Pupils equal round and reactive to light.  Normal conjugate, extra-ocular eye movements in all directions of gaze.  No ptosis.  Face is symmetric. Palate elevates symmetrically.  Tongue is midline.  MOTOR:  Motor strength is 5/5 in all extremities, except distally finger abductors 4/5, toe extensors and flexors 4/5, left finger extensors 4/5 (worse).  No atrophy, fasciculations or abnormal movements.  No pronator drift.  Tone is normal.    MSRs:  Reflexes are 2+/4 in the upper  extremities, and absent in the legs  SENSORY:  Sensory exam shows diffusely absent pin prick and temperature throughout the entire arms and legs, vibration is absent in the legs and hands bilaterally, trace at the elbow,    Romberg sign is positive.   COORDINATION/GAIT:  Normal finger-to- nose-finger.  Intact rapid alternating movements bilaterally.  Gait narrow based and stable, assisted with cane.   Data: NCS/EMG of the right side 12/14/2016:   1. 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  Overall, these findings are severe in degree electrically. 2. A superimposed right ulnar neuropathy  with slowing across the elbow is likely.  Labs 12/11/2017:  Vitamin B12 438, vitamin B1 8, folate 8.5, copper 121, SPEP with IFE no M protein  Lab Results  Component Value Date   HGBA1C 9.4 (H) 05/31/2017   Lab Results  Component Value Date   CREATININE 1.41 07/12/2017   BUN 23 07/12/2017   NA 133 (L) 07/12/2017   K 4.7 07/12/2017   CL 99 07/12/2017   CO2 24 07/12/2017    IMPRESSION/PLAN: Bilateral hand paresthesias, worsening.  His exam today shows much greater involvement of sensory loss as compared to his prior visit.  I suspect this is most likely due to progression of his diabetic neuropathy; however, to evaluate for polyradiculoneuropathy, overlapping entrapment neuropathy, or cervical radiculopathy, NCS/EMG of the upper extremities will be ordered.   I have stressed the importance of trying to keep his blood sugars controlled.  His HbA1c is has slowly come down to 9.4, but still far from where it needs to be.  Diabetic neuropathy affecting a stocking-glove distribution and with sensory ataxia.  Pain is well-controlled on Lyrica 150mg  three times daily and nortriptyline 20mg  at bedtime, but had constant numbness.  Unfortunately, there is no medication for numbness.  I  discussed strategies to minimize injury from extreme temperature.    Return to clinic after testing  Greater than 50% of this 25 minute visit was spent in counseling, explanation of diagnosis, planning of further management, and coordination of care.    Thank you for allowing me to participate in patient's care.  If I can answer any additional questions, I would be pleased to do so.    Sincerely,    Donika K. Posey Pronto, DO

## 2017-07-30 NOTE — Patient Instructions (Addendum)
NCS/EMG of the arms  ELECTROMYOGRAM AND NERVE CONDUCTION STUDIES (EMG/NCS) INSTRUCTIONS  How to Prepare The neurologist conducting the EMG will need to know if you have certain medical conditions. Tell the neurologist and other EMG lab personnel if you: . Have a pacemaker or any other electrical medical device . Take blood-thinning medications . Have hemophilia, a blood-clotting disorder that causes prolonged bleeding Bathing Take a shower or bath shortly before your exam in order to remove oils from your skin. Don't apply lotions or creams before the exam.  What to Expect You'll likely be asked to change into a hospital gown for the procedure and lie down on an examination table. The following explanations can help you understand what will happen during the exam.  . Electrodes. The neurologist or a technician places surface electrodes at various locations on your skin depending on where you're experiencing symptoms. Or the neurologist may insert needle electrodes at different sites depending on your symptoms.  . Sensations. The electrodes will at times transmit a tiny electrical current that you may feel as a twinge or spasm. The needle electrode may cause discomfort or pain that usually ends shortly after the needle is removed. If you are concerned about discomfort or pain, you may want to talk to the neurologist about taking a short break during the exam.  . Instructions. During the needle EMG, the neurologist will assess whether there is any spontaneous electrical activity when the muscle is at rest - activity that isn't present in healthy muscle tissue - and the degree of activity when you slightly contract the muscle.  He or she will give you instructions on resting and contracting a muscle at appropriate times. Depending on what muscles and nerves the neurologist is examining, he or she may ask you to change positions during the exam.  After your EMG You may experience some temporary, minor  bruising where the needle electrode was inserted into your muscle. This bruising should fade within several days. If it persists, contact your primary care doctor.

## 2017-07-31 ENCOUNTER — Ambulatory Visit (INDEPENDENT_AMBULATORY_CARE_PROVIDER_SITE_OTHER): Payer: Medicaid Other | Admitting: Neurology

## 2017-07-31 DIAGNOSIS — G610 Guillain-Barre syndrome: Secondary | ICD-10-CM

## 2017-07-31 DIAGNOSIS — E1142 Type 2 diabetes mellitus with diabetic polyneuropathy: Secondary | ICD-10-CM | POA: Diagnosis not present

## 2017-07-31 DIAGNOSIS — R200 Anesthesia of skin: Secondary | ICD-10-CM

## 2017-07-31 NOTE — Procedures (Signed)
[facility]  [place], [place]  [place], [place] [address] Tel: [phone] Fax:  [phone] Test Date:  07/31/2017  Patient: Danny Fuller DOB: 19-Jul-1975 Physician: Narda Amber, DO  Sex: Male Height: 5\' 9"  Ref Phys: Narda Amber, DO  ID#: [id number] Temp: 35.2C Technician:    Patient Complaints: This is a 42 year old man with diabetes mellitus referred for evaluation of worsening paresthesias and weakness of the hands.  NCV & EMG Findings: Extensive electrodiagnostic testing of the right upper extremity and additional studies of the left shows:  1. Sensory responses affecting bilateral upper extremities show nearly all sensory nerves with prolonged latency and reduced amplitude, except for the left median sensory response which is within normal limits. These findings are worse when compared to his previous study dated 12/31/2016. 2. Right median motor response shows prolonged latency. Left median motor responses within normal limits. Bilateral ulnar motor responses show prolonged latency and conduction velocity slowing across the elbow, which is severe on the left where there is also markedly reduced amplitude.  3. Right ulnar F-wave shows prolonged latency.  4. Chronic motor axon loss changes are seen affecting the distal hand muscles with active denervation isolated to the left first dorsal interosseous muscle.   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  Alternatively, progression of sensorimotor demyelinating and axonal polyneuropathy with a superimposed ulnar neuropathy across the elbow (worse on the left), cannot be excluded. Correlate clinically.   ___________________________ Narda Amber, DO    Nerve Conduction Studies Anti Sensory Summary Table   Site NR  Peak (ms) Norm Peak (ms) P-T Amp (V) Norm P-T Amp  Left Median Anti Sensory (2nd Digit)  35.2C  Wrist    3.2 <3.4 26.4 >20  Right Median Anti Sensory (2nd Digit)  35.2C  Wrist    3.7 <3.4 10.4 >20  Left Radial Anti Sensory (Base 1st Digit)  35.2C  Wrist    2.9 <2.7 24.3 >18  Right Radial Anti Sensory (Base 1st Digit)  35.2C  Wrist    2.5 <2.7 16.0 >18  Left Ulnar Anti Sensory (5th Digit)  35.2C  Wrist    3.3 <3.1 5.1 >12  Right Ulnar Anti Sensory (5th Digit)  35.2C  Wrist    2.8 <3.1 10.7 >12   Motor Summary Table   Site NR Onset (ms) Norm Onset (ms) O-P Amp (mV) Norm O-P Amp Site1 Site2 Delta-0 (ms) Dist (cm) Vel (m/s) Norm Vel (m/s)  Left Median Motor (Abd Poll Brev)  35.2C  Wrist    4.2 <3.9 10.3 >6 Elbow Wrist 6.6 33.0 50 >50  Elbow    10.8  8.4         Right Median Motor (Abd Poll Brev)  35.2C  Wrist    3.9 <3.9 11.0 >6 Elbow Wrist 6.5 34.0 52 >50  Elbow    10.4  9.5         Left Ulnar Motor (Abd Dig Minimi)  35.2C  Wrist    3.4 <3.1 3.9 >7 B Elbow Wrist 5.4 25.0 46 >50  B Elbow    8.8  3.2  A Elbow B Elbow 3.5 10.0 29 >50  A Elbow    12.3  3.0         Right Ulnar Motor (Abd Dig Minimi)  35.2C  Wrist    3.3 <3.1 8.1 >7 B  Elbow Wrist 5.5 28.0 51 >50  B Elbow    8.8  7.0  A Elbow B Elbow 2.8 10.0 36 >50  A Elbow    11.6  6.7          F Wave Studies   NR F-Lat (ms) Lat Norm (ms) L-R F-Lat (ms)  Right Ulnar (Mrkrs) (Abd Dig Min)  35.2C     38.56 <33    EMG   Side Muscle Ins Act Fibs Psw Fasc Number Recrt Dur Dur. Amp Amp. Poly Poly. Comment  Right 1stDorInt Nml Nml Nml Nml 2- Rapid Many 1+ Many 1+ Nml Nml N/A  Right Abd Poll Brev Nml Nml Nml Nml 1- Rapid Some 1+ Nml Nml Nml Nml N/A  Right ABD Dig Min Nml Nml Nml Nml 1- Rapid Some 1+ Nml Nml Nml Nml N/A  Right Ext Indicis Nml Nml Nml Nml Nml Nml Nml Nml Nml Nml Nml Nml N/A  Right PronatorTeres Nml Nml Nml Nml Nml Nml Nml Nml Nml Nml Nml Nml N/A  Right Biceps Nml Nml Nml Nml Nml Nml Nml Nml Nml Nml Nml Nml N/A   Right Triceps Nml Nml Nml Nml Nml Nml Nml Nml Nml Nml Nml Nml N/A  Right Deltoid Nml Nml Nml Nml Nml Nml Nml Nml Nml Nml Nml Nml N/A  Right FlexCarpiUln Nml Nml Nml Nml Nml Nml Nml Nml Nml Nml Nml Nml N/A  Left 1stDorInt Nml 1+ Nml Nml 3- Rapid Most 1+ Most 1+ Some 1+ ATR  Left Abd Poll Brev Nml Nml Nml Nml 1- Rapid Some 1+ Some 1+ Some 1+ N/A  Left Ext Indicis Nml Nml Nml Nml Nml Nml Nml Nml Nml Nml Nml Nml N/A  Left PronatorTeres Nml Nml Nml Nml Nml Nml Nml Nml Nml Nml Nml Nml N/A  Left Biceps Nml Nml Nml Nml Nml Nml Nml Nml Nml Nml Nml Nml N/A  Left Triceps Nml Nml Nml Nml Nml Nml Nml Nml Nml Nml Nml Nml N/A  Left Deltoid Nml Nml Nml Nml Nml Nml Nml Nml Nml Nml Nml Nml N/A  Left Cervical Parasp Low Nml Nml Nml Nml Nml Nml Nml Nml Nml Nml Nml Nml N/A  Left ABD Dig Min Nml Nml Nml Nml 2- Rapid Most 1+ Most 1+ Many 1+ N/A  Left FlexCarpiUln Nml Nml Nml Nml 1- Rapid Some 1+ Some 1+ Nml Nml N/A      Waveforms:

## 2017-08-06 ENCOUNTER — Telehealth: Payer: Self-pay | Admitting: Neurology

## 2017-08-06 NOTE — Telephone Encounter (Signed)
Called and informed patient that there has been interval worsening of neuropathy in the hands, when compared to his last study from November 2018 suggesting sensorimotor polyradiculoneuropathy.  Given the relative rapid course of this progression, I do favor polyradiculoneuropathy over peripheral neuropathy + entrapment neuropathy and recommend CSF testing to further assess this.   Lumbar puncture was discussed and he would like to take time to think about whether to pursue this and will call back with a decision.  If patient agrees to proceed, will check CSF cell count and diff, protein, glucose, IgG index, oligoclonal bands, flow cytology, csf ACE.  Donika K. Posey Pronto, DO

## 2017-08-06 NOTE — Telephone Encounter (Signed)
Noted  

## 2017-08-21 ENCOUNTER — Encounter: Payer: Self-pay | Admitting: Pulmonary Disease

## 2017-08-22 ENCOUNTER — Ambulatory Visit
Admission: RE | Admit: 2017-08-22 | Discharge: 2017-08-22 | Disposition: A | Discharge status: Home / Self Care | Payer: Medicaid Other | Source: Ambulatory Visit | Attending: Orthopaedic Surgery | Admitting: Orthopaedic Surgery

## 2017-08-22 DIAGNOSIS — M545 Low back pain: Principal | ICD-10-CM

## 2017-08-22 DIAGNOSIS — G8929 Other chronic pain: Secondary | ICD-10-CM

## 2017-08-28 ENCOUNTER — Ambulatory Visit (INDEPENDENT_AMBULATORY_CARE_PROVIDER_SITE_OTHER): Payer: Medicaid Other | Admitting: Orthopaedic Surgery

## 2017-08-28 ENCOUNTER — Encounter (INDEPENDENT_AMBULATORY_CARE_PROVIDER_SITE_OTHER): Payer: Self-pay | Admitting: Orthopaedic Surgery

## 2017-08-28 DIAGNOSIS — M545 Low back pain: Secondary | ICD-10-CM | POA: Diagnosis not present

## 2017-08-28 DIAGNOSIS — G8929 Other chronic pain: Secondary | ICD-10-CM | POA: Diagnosis not present

## 2017-08-28 DIAGNOSIS — M48061 Spinal stenosis, lumbar region without neurogenic claudication: Secondary | ICD-10-CM

## 2017-08-28 NOTE — Progress Notes (Signed)
Office Visit Note   Patient: Danny Fuller           Date of Birth: Aug 22, 1975           MRN: [id number] Visit Date: 08/28/2017              Requested by: [name], MD [place], [place] [id number] PCP: [name], MD   Assessment & Plan: Visit Diagnoses:  1. Chronic low back pain, unspecified back pain laterality, with sciatica presence unspecified   2. Spinal stenosis of lumbar region without neurogenic claudication     Plan: Impression is L4-5 lumbar spinal stenosis were reviewed with the patient.  Referral to Dr. Ernestina Patches for epidural steroid injection.  Follow-up as needed.  Follow-Up Instructions: Return if symptoms worsen or fail to improve.   Orders:  No orders of the defined types were placed in this encounter.  No orders of the defined types were placed in this encounter.     Procedures: No procedures performed   Clinical Data: No additional findings.   Subjective: Chief Complaint  Patient presents with  . Lower Back - Pain    Patient comes in today for MRI review.  Her symptoms are slightly improved.   Review of Systems  Constitutional: Negative.   All other systems reviewed and are negative.    Objective: Vital Signs: There were no vitals taken for this visit.  Physical Exam  Constitutional: He is oriented to person, place, and time. He appears well-developed and well-nourished.  Pulmonary/Chest: Effort normal.  Abdominal: Soft.  Neurological: He is alert and oriented to person, place, and time.  Skin: Skin is warm.  Psychiatric: He has a normal mood and affect. His behavior is normal. Judgment and thought content normal.  Nursing note and vitals reviewed.   Ortho Exam Stable exam. Specialty Comments:  No specialty comments available.  Imaging: No results found.   PMFS History: Patient Active Problem List   Diagnosis Date Noted  . Chronic low back pain with bilateral sciatica 07/24/2017  .  Hyperlipidemia 03/02/2017  . Chest pain 03/02/2017  . Acute kidney failure ([place]) 02/07/2017  . OSA (obstructive sleep apnea) 01/29/2017  . Insomnia 01/29/2017  . PVD (peripheral vascular disease) ([place]) 11/28/2016  . Palpitation 11/28/2016  . Erectile dysfunction associated with type 2 diabetes mellitus ([place]) 05/08/2016  . Poorly controlled diabetes mellitus ([place]) 03/30/2016  . Necrotizing fasciitis ([place]) 03/07/2016  . Pyelonephritis 03/04/2016  . Acquired contracture of Achilles tendon, left 02/15/2016  . Arthralgia of multiple joints 01/19/2016  . Diabetic polyneuropathy associated with type 2 diabetes mellitus ([place]) 01/18/2016  . Acquired absence of other left toe(s) ([place]) 01/18/2016  . Foot amputation status, left ([place]) 01/04/2016  . Peripheral neuropathy 11/11/2015  . AKI (acute kidney injury) ([place])   . Asthma 07/23/2015  . Chronic headache 07/23/2015  . Erectile disorder due to medical condition in male 04/23/2015  . BMI 33.0-33.9,adult 04/21/2012  . GERD 04/21/2008  . Type 2 diabetes mellitus with diabetic neuropathy, with long-term current use of insulin ([place]) 11/21/2006  . Essential hypertension 11/21/2006  . LUMBAR STRAIN 11/21/2006   Past Medical History:  Diagnosis Date  . Asthma   . Chronic kidney disease    sees Dr. Jamal Maes    . Diabetes mellitus    sees Dr. Dwyane Dee   . GERD (gastroesophageal reflux disease)   . Headache(784.0)   . Hyperlipidemia   . Hypertension     Family History  Problem Relation Age of Onset  . Arthritis Unknown   . Diabetes Unknown   . Hypertension Unknown   . Hyperlipidemia Unknown   . Stroke Unknown   . Sudden death Unknown   . Diabetes Mother   . Diabetes Sister   . Diabetes Brother   . Heart attack Father        Died ate 82, couple of "heart attacks"     Past Surgical History:  Procedure Laterality Date  . AMPUTATION Left 07/26/2015   Procedure: AMPUTATION LEFT FIFTH RAY;  Surgeon: [name], MD;  Location: [facility];   Service: Orthopedics;  Laterality: Left;  . bilateral hip pins placed    . INCISION AND DRAINAGE PERIRECTAL ABSCESS Right 03/07/2016   Procedure: IRRIGATION AND DEBRIDEMENT PERIRECTAL ABSCESS;  Surgeon: Alphonsa Overall, MD;  Location: [facility];  Service: General;  Laterality: Right;  . INCISION AND DRAINAGE PERIRECTAL ABSCESS Right 03/09/2016   Procedure: EXAM UNDER ANESTHESIA, IRRIGATION AND DEBRIDEMENT PERIRECTAL ABSCESS;  Surgeon: Johnathan Hausen, MD;  Location: [facility];  Service: General;  Laterality: Right;  Open, betadine packed wound   Social History   Occupational History  . Occupation: disability  Tobacco Use  . Smoking status: Former Smoker    Packs/day: 0.50    Years: 20.00    Pack years: 10.00    Types: Cigarettes    Last attempt to quit: 07/23/2015    Years since quitting: 2.1  . Smokeless tobacco: Never Used  Substance and Sexual Activity  . Alcohol use: No    Alcohol/week: 0.0 oz  . Drug use: No  . Sexual activity: Not on file

## 2017-08-28 NOTE — Addendum Note (Signed)
Addended by: Precious Bard on: 08/28/2017 10:56 AM   Modules accepted: Orders

## 2017-09-16 ENCOUNTER — Encounter: Payer: Self-pay | Admitting: Podiatry

## 2017-09-17 ENCOUNTER — Encounter: Payer: Self-pay | Admitting: Endocrinology

## 2017-09-17 ENCOUNTER — Ambulatory Visit (INDEPENDENT_AMBULATORY_CARE_PROVIDER_SITE_OTHER): Payer: Medicaid Other | Admitting: Endocrinology

## 2017-09-17 VITALS — BP 142/80 | HR 84 | Ht 69.5 in | Wt 265.8 lb

## 2017-09-17 DIAGNOSIS — E1165 Type 2 diabetes mellitus with hyperglycemia: Secondary | ICD-10-CM

## 2017-09-17 DIAGNOSIS — Z794 Long term (current) use of insulin: Secondary | ICD-10-CM | POA: Diagnosis not present

## 2017-09-17 LAB — COMPREHENSIVE METABOLIC PANEL
ALBUMIN: 4.2 g/dL (ref 3.5–5.2)
ALK PHOS: 81 U/L (ref 39–117)
ALT: 20 U/L (ref 0–53)
AST: 17 U/L (ref 0–37)
BUN: 18 mg/dL (ref 6–23)
CHLORIDE: 98 meq/L (ref 96–112)
CO2: 26 mEq/L (ref 19–32)
Calcium: 10.4 mg/dL (ref 8.4–10.5)
Creatinine, Ser: 1.13 mg/dL (ref 0.40–1.50)
GFR: 92.35 mL/min (ref >60.00)
GLUCOSE: 380 mg/dL — AB (ref 70–99)
POTASSIUM: 4.2 meq/L (ref 3.5–5.1)
Sodium: 131 mEq/L — ABNORMAL LOW (ref 135–145)
TOTAL PROTEIN: 7.7 g/dL (ref 6.0–8.3)
Total Bilirubin: 0.5 mg/dL (ref 0.2–1.2)

## 2017-09-17 LAB — POCT GLYCOSYLATED HEMOGLOBIN (HGB A1C): HEMOGLOBIN A1C: 12.3 % — AB (ref 4.0–5.6)

## 2017-09-17 LAB — GLUCOSE, POCT (MANUAL RESULT ENTRY): POC GLUCOSE: 364 mg/dL — AB (ref 70–99)

## 2017-09-17 MED ORDER — VICTOZA 18 MG/3ML ~~LOC~~ SOPN: 1.2000 mg | PEN_INJECTOR | Freq: Every day | SUBCUTANEOUS | 3 refills | Status: DC

## 2017-09-17 MED ORDER — SKIN ADHESIVES EX LIQD: CUTANEOUS | 0 refills | Status: DC

## 2017-09-17 MED ORDER — SKIN TAC ADHESIVE BARRIER WIPE MISC: 1.0000 | 0 refills | Status: AC

## 2017-09-17 MED ORDER — INSULIN GLARGINE 100 UNIT/ML SOLOSTAR PEN: PEN_INJECTOR | SUBCUTANEOUS | 0 refills | Status: DC

## 2017-09-17 NOTE — Progress Notes (Signed)
Patient ID: Danny Fuller, male   DOB: [birth year], 42 y.o.   MRN: [id number]            Reason for Appointment: Follow-up for Type 2 Diabetes and hypertension  Referring physician: Sarajane Jews   History of Present Illness:          Date of diagnosis of type 2 diabetes mellitus: 2007        Background history:   At diagnosis he was relatively asymptomatic and was started on metformin and glipizide He has been on the same oral hypoglycemic regimen for several years. Review of his A1c indicates it has been markedly increased persistently with the lowest reading in 2015 being 9.1  Recent history:   INSULIN regimen is:  OMNIPOD pump since 01/05/17  BASAL rate 12 AM = 4.5   Carbohydrate ratio 1:10, sensitivity 1:15 and target 120 Active insulin 3 hours  Non-insulin hypoglycemic drugs the patient is taking are: Metformin ER 2000 mg daily, Jardiance 25 mg daily, Victoza 1.2 mg daily  His A1c is markedly increased at 12.3, previously 9.4   Current management, blood sugar patterns and problems identified:  He is having some significant problems with making his Omnipod pumps stick to his skin this summer because of increased sweating  Also he has had inconsistent delivery of insulin with periodic difficulties and the pod suspending delivery despite discussion with the toll-free support line  Also he thinks that he has not had his NovoLog prescription consistently until today  He has been trying to use Lantus but his blood sugars have been as high as 490 at home  However he did not bring his monitor for download today and not clear what his blood sugars are  Has not been able to get his Victoza prescription refilled for various reasons  He thinks that when he is using the Omnipod pump and it is working well his blood sugar may be as low as 120 and may also get low normal when he is more active        Side effects from medications have been: None  Compliance with the medical regimen:  Fair  Glucose monitoring:  with freestyle meter  Blood Glucose readings as above  Self-care: Meal times are:  Breakfast is at 9-10 am, Lunch-1 PM : Dinner: 8 pm   Typical meal intake: Breakfast is Usually toast and boiled eggs.  Usually getting cold cut sandwiches at lunch, grilled chicken and vegetables/potatoes at dinner.  Snacks are usually chips, crackers                Dietician visit, most recent: 05/02/16               Exercise:  some walking, limited by pain  Weight history: His highest weight in the past has been 378    Wt Readings from Last 3 Encounters:  09/17/17 265 lb 12.8 oz (120.6 kg)  07/30/17 270 lb (122.5 kg)  07/24/17 260 lb 12.8 oz (118.3 kg)    Glycemic control:   Lab Results  Component Value Date   HGBA1C 12.3 (A) 09/17/2017   HGBA1C 9.4 (H) 05/31/2017   HGBA1C 8.5 02/21/2017   Lab Results  Component Value Date   MICROALBUR 60.9 (H) 05/31/2017   LDLCALC 51 03/23/2016   CREATININE 1.41 07/12/2017   Lab Results  Component Value Date   MICRALBCREAT 27.8 05/31/2017    Lab Results  Component Value Date   FRUCTOSAMINE 380 (H) 07/12/2017   FRUCTOSAMINE 280  04/02/2017   FRUCTOSAMINE 401 (H) 01/10/2017      Allergies as of 09/17/2017      Reactions   Vancomycin Other (See Comments)   AKI      Medication List        Accurate as of 09/17/17 10:49 AM. Always use your most recent med list.          benazepril 40 MG tablet Commonly known as:  LOTENSIN Take 1 tablet (40 mg total) by mouth daily.   chlorthalidone 25 MG tablet Commonly known as:  HYGROTON take 1 tablet by mouth once daily   empagliflozin 25 MG Tabs tablet Commonly known as:  JARDIANCE Take 25 mg by mouth daily.   furosemide 40 MG tablet Commonly known as:  LASIX Take 1.5 tablets (60 mg total) by mouth daily.   insulin aspart 100 UNIT/ML injection Commonly known as:  NOVOLOG Use up to 150 units daily via pump   Insulin Glargine 100 UNIT/ML Solostar Pen Commonly  known as:  LANTUS SOLOSTAR 50 units 2x daily   LYRICA 150 MG capsule Generic drug:  pregabalin   nortriptyline 10 MG capsule Commonly known as:  PAMELOR Start nortriptyline 10mg  at bedtime for 2 week, then increase to 2 tablet at bedtime   pravastatin 40 MG tablet Commonly known as:  PRAVACHOL Take 1 tablet (40 mg total) by mouth every evening.   traMADol 50 MG tablet Commonly known as:  ULTRAM take 2 tablets by mouth every 6 hours if needed for pain   VICTOZA 18 MG/3ML Sopn Generic drug:  liraglutide Inject 0.2 mLs (1.2 mg total) into the skin daily. Inject once daily at the same time       Allergies:  Allergies  Allergen Reactions  . Vancomycin Other (See Comments)    AKI    Past Medical History:  Diagnosis Date  . Asthma   . Chronic kidney disease    sees Dr. Jamal Maes    . Diabetes mellitus    sees Dr. Dwyane Dee   . GERD (gastroesophageal reflux disease)   . Headache(784.0)   . Hyperlipidemia   . Hypertension     Past Surgical History:  Procedure Laterality Date  . AMPUTATION Left 07/26/2015   Procedure: AMPUTATION LEFT FIFTH RAY;  Surgeon: [name], MD;  Location: [facility];  Service: Orthopedics;  Laterality: Left;  . bilateral hip pins placed    . INCISION AND DRAINAGE PERIRECTAL ABSCESS Right 03/07/2016   Procedure: IRRIGATION AND DEBRIDEMENT PERIRECTAL ABSCESS;  Surgeon: Alphonsa Overall, MD;  Location: [facility];  Service: General;  Laterality: Right;  . INCISION AND DRAINAGE PERIRECTAL ABSCESS Right 03/09/2016   Procedure: EXAM UNDER ANESTHESIA, IRRIGATION AND DEBRIDEMENT PERIRECTAL ABSCESS;  Surgeon: Johnathan Hausen, MD;  Location: [facility];  Service: General;  Laterality: Right;  Open, betadine packed wound    Family History  Problem Relation Age of Onset  . Arthritis Unknown   . Diabetes Unknown   . Hypertension Unknown   . Hyperlipidemia Unknown   . Stroke Unknown   . Sudden death Unknown   . Diabetes Mother   . Diabetes Sister   . Diabetes  Brother   . Heart attack Father        Died ate 89, couple of "heart attacks"     Social History:  reports that he quit smoking about 2 years ago. His smoking use included cigarettes. He has a 10.00 pack-year smoking history. He has never used smokeless tobacco. He reports that he does  not drink alcohol or use drugs.   Review of Systems   Lipid history: LDL has been below 100, mild increase in triglycerides present    Lab Results  Component Value Date   CHOL 144 07/12/2017   HDL 28.90 (L) 07/12/2017   LDLCALC 51 03/23/2016   LDLDIRECT 89.0 07/12/2017   TRIG 201.0 (H) 07/12/2017   CHOLHDL 5 07/12/2017           Hypertension: On treatment for several years His medications have been changed periodically including by his PCP  Currently he is taking only benazepril and chlorthalidone, had been on doxazosin and clonidine but these were stopped by his PCP   BP Readings from Last 3 Encounters:  09/17/17 (!) 142/80  07/30/17 130/90  07/24/17 124/82    EDEMA: He has history of significant leg edema He is taking Lasix 60 mg prn for leg edema Recently has not had as much swelling and may take this only a couple of times a week   Lab Results  Component Value Date   CREATININE 1.41 07/12/2017   BUN 23 07/12/2017   NA 133 (L) 07/12/2017   K 4.7 07/12/2017   CL 99 07/12/2017   CO2 24 07/12/2017    He has sharp pains in his feet from neuropathy,  on Lyrica to control pain and numbness His symptoms are somewhat worse Also on nortriptyline at bedtime     LABS:  Office Visit on 09/17/2017  Component Date Value Ref Range Status  . Hemoglobin A1C 09/17/2017 12.3* 4.0 - 5.6 % Final  . POC Glucose 09/17/2017 364* 70 - 99 mg/dl Final    Physical Examination:  BP (!) 142/80 (BP Location: Left Arm, Patient Position: Sitting, Cuff Size: Normal)   Pulse 84   Ht 5' 9.5" (1.765 m)   Wt 265 lb 12.8 oz (120.6 kg)   SpO2 97%   BMI 38.69 kg/m   He has markedly decreased  monofilament sensation in his toes He has a significant callus on the plantar surface of the left foot medially Mild prominence of the metatarsal base present laterally, old amputation of the toe present  ASSESSMENT:  Diabetes type 2, uncontrolled, insulin-dependent   See history of present illness for detailed discussion of current diabetes management, blood sugar patterns and problems identified  His A1c was 9.4 last and now 12.3  He has his poor control of his blood sugars because of not getting insulin insistently with either the pump or the injection regimen  Although he has done well with the Omnipod he has difficulty with making this work and sticking consistently Also not taking Victoza recently Most likely he may need to be switched back to the V-go pump and since his basal insulin is now over 100 units may benefit from the U-500 insulin  Also needs to bring his monitor for download as he did not today and difficult to get an idea of his blood sugar patterns Blood sugar is well over 300 in the office today  HYPERTENSION: control is good now, using less medications, systolic high normal but he is getting fairly good readings at home  Edema: Controlled and taking Lasix only as needed  Callus on his foot and significant neuropathy: He will follow-up with his podiatrist for orthotics  PLAN:    He will try the skin Tac to prepare his abdomen area before applying the Omnipod pump Continue basal of 4.5 Needs to be seen in 1 month for short-term follow-up Able to bring  the monitor and also try to check blood sugars more frequently Discussed use of the temporary basal or suspending the pump when he plans to be more active outside  He will still keep the Lantus as back-up and when he cannot use the pump and use 50 units twice a day He will call if he needs to switch to the V-go pump with the U-500 insulin  For his hypertension he will stay on the same regimen but needs follow-up  renal function checked today  Prescription for Victoza sent  Counseling time on subjects discussed in assessment and plan sections is over 50% of today's 25 minute visit    There are no Patient Instructions on file for this visit.  [name] 09/17/2017, 10:49 AM   Note: This office note was prepared with Dragon voice recognition system technology. Any transcriptional errors that result from this process are unintentional.

## 2017-09-17 NOTE — Patient Instructions (Signed)
Check blood sugars on waking up  daily  Also check blood sugars about 2 hours after a meal and do this after different meals by rotation  Recommended blood sugar levels on waking up is 90-130 and about 2 hours after meal is 130-160  Please bring your blood sugar monitor to each visit, thank you

## 2017-09-19 ENCOUNTER — Encounter (INDEPENDENT_AMBULATORY_CARE_PROVIDER_SITE_OTHER): Payer: Self-pay | Admitting: Physical Medicine and Rehabilitation

## 2017-09-19 ENCOUNTER — Ambulatory Visit (INDEPENDENT_AMBULATORY_CARE_PROVIDER_SITE_OTHER): Payer: Self-pay

## 2017-09-19 ENCOUNTER — Ambulatory Visit (INDEPENDENT_AMBULATORY_CARE_PROVIDER_SITE_OTHER): Payer: Medicaid Other | Admitting: Physical Medicine and Rehabilitation

## 2017-09-19 VITALS — BP 168/100 | HR 95

## 2017-09-19 DIAGNOSIS — M47816 Spondylosis without myelopathy or radiculopathy, lumbar region: Secondary | ICD-10-CM | POA: Diagnosis not present

## 2017-09-19 MED ORDER — BUPIVACAINE HCL 0.5 % IJ SOLN: 3.0000 mL | Freq: Once | INTRAMUSCULAR | Status: DC

## 2017-09-19 NOTE — Patient Instructions (Signed)

## 2017-09-19 NOTE — Progress Notes (Signed)
 .  Numeric Pain Rating Scale and Functional Assessment Average Pain 9   In the last MONTH (on 0-10 scale) has pain interfered with the following?  1. General activity like being  able to carry out your everyday physical activities such as walking, climbing stairs, carrying groceries, or moving a chair?  Rating(4)   +Driver, -BT, -Dye Allergies.  

## 2017-09-22 ENCOUNTER — Encounter: Payer: Self-pay | Admitting: Podiatry

## 2017-09-24 NOTE — Procedures (Signed)
Lumbar Diagnostic Facet Joint Nerve Block with Fluoroscopic Guidance   Patient: Danny Fuller      Date of Birth: 1975/08/19 MRN: [id number] PCP: [name], MD      Visit Date: 09/19/2017   Universal Protocol:    Date/Time: 08/12/196:24 AM  Consent Given By: the patient  Position: PRONE  Additional Comments: Vital signs were monitored before and after the procedure. Patient was prepped and draped in the usual sterile fashion. The correct patient, procedure, and site was verified.   Injection Procedure Details:  Procedure Site One Meds Administered:  Meds ordered this encounter  Medications  . bupivacaine (MARCAINE) 0.5 % (with pres) injection 3 mL     Laterality: Bilateral  Location/Site:  L4-L5  Needle size: 22 ga.  Needle type:spinal  Needle Placement: Oblique pedical  Findings:   -Comments: There was excellent flow of contrast along the articular pillars without intravascular flow.  Procedure Details: The fluoroscope beam is vertically oriented in AP and then obliqued 15 to 20 degrees to the ipsilateral side of the desired nerve to achieve the "Scotty dog" appearance.  The skin over the target area of the junction of the superior articulating process and the transverse process (sacral ala if blocking the L5 dorsal rami) was locally anesthetized with a 1 ml volume of 1% Lidocaine without Epinephrine.  The spinal needle was inserted and advanced in a trajectory view down to the target.   After contact with periosteum and negative aspirate for blood and CSF, correct placement without intravascular or epidural spread was confirmed by injecting 0.5 ml. of Isovue-250.  A spot radiograph was obtained of this image.    Next, a 0.5 ml. volume of the injectate described above was injected. The needle was then redirected to the other facet joint nerves mentioned above if needed.  Prior to the procedure, the patient was given a Pain Diary which was completed for  baseline measurements.  After the procedure, the patient rated their pain every 30 minutes and will continue rating at this frequency for a total of 5 hours.  The patient has been asked to complete the Diary and return to [name] by mail, fax or hand delivered as soon as possible.   Additional Comments:  The patient tolerated the procedure well Dressing: Band-Aid    Post-procedure details: Patient was observed during the procedure. Post-procedure instructions were reviewed.  Patient left the clinic in stable condition.

## 2017-09-24 NOTE — Progress Notes (Signed)
Danny Fuller - 42 y.o. male MRN [id number]  Date of birth: [birth year]  Office Visit Note: Visit Date: 09/19/2017 PCP: [name], MD Referred by: [name], MD  Subjective: Chief Complaint  Patient presents with  . Lower Back - Pain  . Middle Back - Pain  . Right Leg - Pain, Numbness, Tingling  . Left Leg - Numbness, Pain, Tingling   HPI: Danny Fuller is a 42 year old gentleman who comes in today at the request of Dr. Eduard Roux for possible interventional spine procedure for chronic low back pain with some issues in both legs but with a history of uncontrolled diabetes.  Last blood sugar measurements were in the 300 range.  His hemoglobin A1c was above 12.  His main complaint is axial low back pain worse with standing for any length of time.  He has had electrodiagnostic study by Dr. Narda Amber.  This did show polyneuropathy and possible polyradiculopathy.  MRI of the lumbar spine shows mainly facet arthropathy at L4-5 and some at L5-S1 and there is small disc protrusion at L4-5 with more left-sided lateral recess narrowing.  Based on the patient's blood sugar control I do not think it would be wise for steroid injection in the epidural spine but we would look at today at diagnostic medial branch blocks with a pain diary to see if we get some relief of his back pain.  Radiofrequency ablation may be a good alternative for his back pain given his history of diabetes that is pretty uncontrolled at this point.  He has had some questions about working and disability with his back and have asked him to address that with Dr. Erlinda Hong.  From a structural standpoint his back and spine actually look fairly well he does have some congenital narrowing but other than that arthritic changes in general.   ROS Otherwise per HPI.  Assessment & Plan: Visit Diagnoses:  1. Spondylosis without myelopathy or radiculopathy, lumbar region     Plan: No additional findings.   Meds & Orders:  Meds ordered  this encounter  Medications  . bupivacaine (MARCAINE) 0.5 % (with pres) injection 3 mL    Orders Placed This Encounter  Procedures  . Facet Injection  . XR C-ARM NO REPORT    Follow-up: Return if symptoms worsen or fail to improve.   Procedures: No procedures performed  Lumbar Diagnostic Facet Joint Nerve Block with Fluoroscopic Guidance   Patient: Danny Fuller      Date of Birth: 08/02/1975 MRN: [id number] PCP: [name], MD      Visit Date: 09/19/2017   Universal Protocol:    Date/Time: 08/12/196:24 AM  Consent Given By: the patient  Position: PRONE  Additional Comments: Vital signs were monitored before and after the procedure. Patient was prepped and draped in the usual sterile fashion. The correct patient, procedure, and site was verified.   Injection Procedure Details:  Procedure Site One Meds Administered:  Meds ordered this encounter  Medications  . bupivacaine (MARCAINE) 0.5 % (with pres) injection 3 mL     Laterality: Bilateral  Location/Site:  L4-L5  Needle size: 22 ga.  Needle type:spinal  Needle Placement: Oblique pedical  Findings:   -Comments: There was excellent flow of contrast along the articular pillars without intravascular flow.  Procedure Details: The fluoroscope beam is vertically oriented in AP and then obliqued 15 to 20 degrees to the ipsilateral side of the desired nerve to achieve the "Scotty dog" appearance.  The  skin over the target area of the junction of the superior articulating process and the transverse process (sacral ala if blocking the L5 dorsal rami) was locally anesthetized with a 1 ml volume of 1% Lidocaine without Epinephrine.  The spinal needle was inserted and advanced in a trajectory view down to the target.   After contact with periosteum and negative aspirate for blood and CSF, correct placement without intravascular or epidural spread was confirmed by injecting 0.5 ml. of Isovue-250.  A spot radiograph  was obtained of this image.    Next, a 0.5 ml. volume of the injectate described above was injected. The needle was then redirected to the other facet joint nerves mentioned above if needed.  Prior to the procedure, the patient was given a Pain Diary which was completed for baseline measurements.  After the procedure, the patient rated their pain every 30 minutes and will continue rating at this frequency for a total of 5 hours.  The patient has been asked to complete the Diary and return to [name] by mail, fax or hand delivered as soon as possible.   Additional Comments:  The patient tolerated the procedure well Dressing: Band-Aid    Post-procedure details: Patient was observed during the procedure. Post-procedure instructions were reviewed.  Patient left the clinic in stable condition.   Clinical History: MRI LUMBAR SPINE WITHOUT CONTRAST  TECHNIQUE: Multiplanar, multisequence MR imaging of the lumbar spine was performed. No intravenous contrast was administered.  COMPARISON:  Lumbar spine radiographs 06/04/2017. Abdominopelvic CT 01/11/2017.  FINDINGS: Segmentation: Conventional anatomy assumed, with the last open disc space designated L5-S1.This is concordant with previous imaging.  Alignment: Stable mild convex left scoliosis. Normal sagittal alignment.  Vertebrae: No worrisome osseous lesion, acute fracture or pars defect. The lumbar pedicles are somewhat short on a congenital basis. The visualized sacroiliac joints appear unremarkable.  Conus medullaris: Extends to the L1-2 level and appears normal. Thin fatty filum noted.  Paraspinal and other soft tissues: No significant paraspinal findings. Cyst in the lower pole the left kidney is partially imaged, grossly stable.  Disc levels:  T12-L1: Mild disc degeneration and bulging. No spinal stenosis or nerve root encroachment.  No significant disc space findings at L1-2 or L2-3.  L3-4: Disc height and  hydration are maintained. Mild facet hypertrophy. No spinal stenosis or nerve root encroachment.  L4-5: Disc desiccation with bulging and a broad-based protrusion in the left subarticular zone and foramen. Mild facet and ligamentous hypertrophy. These factors contribute to mild spinal stenosis with asymmetric narrowing of the left lateral recess and possible left L5 nerve root encroachment. There is also mild left foraminal narrowing.  L5-S1: Mild disc desiccation and bulging. Mild facet and ligamentous hypertrophy. There is mild left foraminal narrowing without definite nerve root encroachment.  IMPRESSION: 1. 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 The left foramen also appears mildly narrowed. 2. Mild left foraminal narrowing at L5-S1 without definite nerve root encroachment. 3. No other significant acquired disc space findings. Congenitally short pedicles.   Electronically Signed   By: Richardean Sale M.D.   On: 08/22/2017 09:18   He reports that he quit smoking about 2 years ago. His smoking use included cigarettes. He has a 10.00 pack-year smoking history. He has never used smokeless tobacco.  Recent Labs    02/21/17 1031 05/31/17 0932 09/17/17 1039  HGBA1C 8.5  9.4* 12.3*    Objective:  VS:  HT:    WT:   BMI:     BP:(!) 168/100  HR:95bpm  TEMP: ( )  RESP:  Physical Exam  Ortho Exam Imaging: No results found.  Past Medical/Family/Surgical/Social History: Medications & Allergies reviewed per EMR, new medications updated. Patient Active Problem List   Diagnosis Date Noted  . Chronic low back pain with bilateral sciatica 07/24/2017  . Hyperlipidemia 03/02/2017  . Chest pain 03/02/2017  . Acute kidney failure ([place]) 02/07/2017  . OSA (obstructive sleep apnea) 01/29/2017  . Insomnia  01/29/2017  . PVD (peripheral vascular disease) ([place]) 11/28/2016  . Palpitation 11/28/2016  . Erectile dysfunction associated with type 2 diabetes mellitus ([place]) 05/08/2016  . Poorly controlled diabetes mellitus ([place]) 03/30/2016  . Necrotizing fasciitis ([place]) 03/07/2016  . Pyelonephritis 03/04/2016  . Acquired contracture of Achilles tendon, left 02/15/2016  . Arthralgia of multiple joints 01/19/2016  . Diabetic polyneuropathy associated with type 2 diabetes mellitus ([place]) 01/18/2016  . Acquired absence of other left toe(s) ([place]) 01/18/2016  . Foot amputation status, left ([place]) 01/04/2016  . Peripheral neuropathy 11/11/2015  . AKI (acute kidney injury) ([place])   . Asthma 07/23/2015  . Chronic headache 07/23/2015  . Erectile disorder due to medical condition in male 04/23/2015  . BMI 33.0-33.9,adult 04/21/2012  . GERD 04/21/2008  . Type 2 diabetes mellitus with diabetic neuropathy, with long-term current use of insulin ([place]) 11/21/2006  . Essential hypertension 11/21/2006  . LUMBAR STRAIN 11/21/2006   Past Medical History:  Diagnosis Date  . Asthma   . Chronic kidney disease    sees Dr. Jamal Maes    . Diabetes mellitus    sees Dr. Dwyane Dee   . GERD (gastroesophageal reflux disease)   . Headache(784.0)   . Hyperlipidemia   . Hypertension    Family History  Problem Relation Age of Onset  . Arthritis Unknown   . Diabetes Unknown   . Hypertension Unknown   . Hyperlipidemia Unknown   . Stroke Unknown   . Sudden death Unknown   . Diabetes Mother   . Diabetes Sister   . Diabetes Brother   . Heart attack Father        Died ate 42, couple of "heart attacks"    Past Surgical History:  Procedure Laterality Date  . AMPUTATION Left 07/26/2015   Procedure: AMPUTATION LEFT FIFTH RAY;  Surgeon: [name], MD;  Location: [facility];  Service: Orthopedics;  Laterality: Left;  . bilateral hip pins placed    . INCISION AND DRAINAGE PERIRECTAL ABSCESS Right 03/07/2016   Procedure:  IRRIGATION AND DEBRIDEMENT PERIRECTAL ABSCESS;  Surgeon: Alphonsa Overall, MD;  Location: [facility];  Service: General;  Laterality: Right;  . INCISION AND DRAINAGE PERIRECTAL ABSCESS Right 03/09/2016   Procedure: EXAM UNDER ANESTHESIA, IRRIGATION AND DEBRIDEMENT PERIRECTAL ABSCESS;  Surgeon: Johnathan Hausen, MD;  Location: [facility];  Service: General;  Laterality: Right;  Open, betadine packed wound   Social History   Occupational History  . Occupation: disability  Tobacco Use  . Smoking status: Former Smoker    Packs/day: 0.50    Years: 20.00    Pack years: 10.00    Types: Cigarettes    Last attempt to quit: 07/23/2015    Years since quitting: 2.1  . Smokeless tobacco: Never Used  Substance and Sexual Activity  . Alcohol use: No    Alcohol/week: 0.0 standard drinks  . Drug use: No  . Sexual activity: Not on file

## 2017-09-26 ENCOUNTER — Encounter: Payer: Self-pay | Admitting: Family Medicine

## 2017-09-26 DIAGNOSIS — L989 Disorder of the skin and subcutaneous tissue, unspecified: Secondary | ICD-10-CM

## 2017-09-27 ENCOUNTER — Encounter: Payer: Self-pay | Admitting: Podiatry

## 2017-09-27 ENCOUNTER — Telehealth: Payer: Self-pay

## 2017-09-27 MED ORDER — TRAMADOL HCL 50 MG PO TABS: ORAL_TABLET | ORAL | 5 refills | Status: DC

## 2017-09-27 NOTE — Telephone Encounter (Signed)
Call in #[phone] with 5 rf 

## 2017-09-27 NOTE — Telephone Encounter (Signed)
Called in Rx for pt. Pharmacist  stated that the pt will need a PA done for this prescription due to their insurance.   Sent to   [facility] stated that they will fax a PA request

## 2017-09-27 NOTE — Telephone Encounter (Signed)
No he is already a pt of Dr. Paulla Dolly so there is not much I can do

## 2017-09-27 NOTE — Telephone Encounter (Signed)
[facility]   Fax refill request   Tramadol 50 mg tablet   Last OV 07/24/2017   Last refill 03/20/2017 disp 120 with 5 refills   Sent to PCP to advise

## 2017-09-28 ENCOUNTER — Encounter: Payer: Self-pay | Admitting: Neurology

## 2017-09-28 ENCOUNTER — Ambulatory Visit (INDEPENDENT_AMBULATORY_CARE_PROVIDER_SITE_OTHER): Payer: Medicaid Other | Admitting: Neurology

## 2017-09-28 VITALS — BP 140/90 | HR 84 | Ht 69.5 in | Wt 266.0 lb

## 2017-09-28 DIAGNOSIS — E1142 Type 2 diabetes mellitus with diabetic polyneuropathy: Secondary | ICD-10-CM | POA: Diagnosis not present

## 2017-09-28 MED ORDER — NORTRIPTYLINE HCL 10 MG PO CAPS
ORAL_CAPSULE | ORAL | 5 refills | Status: DC
Start: 2017-09-28 — End: 2017-12-25

## 2017-09-28 NOTE — Progress Notes (Signed)
Follow-up Visit   Date: 09/28/17    Danny Fuller MRN: [id number] DOB: 1976/01/30   Interim History: Danny Fuller is a 42 y.o. left-handed African American male with diabetes mellitus, GERD, and hypertension returning to the clinic for follow-up of diabetic neuropathy.  The patient was accompanied to the clinic by self.  History of present illness: He was diagnosed with diabetes in 2002 and began having having numbness of the feet in 2016.  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  Numbness involves the entire feet, which is worse on the left.  He also complain of tingling involving the tingling and lower legs.  He takes Lyrica 150mg  twice daily which provides about 35% relief and allows him to sleep.  He has tried gabapentin, which did not provide any relief.   He also complains of numbness and tingling over the upper arm and into his finger tips, which is worse on the right.  He has weakness of the hands and frequently drops objects.  He also complains of achy neck pain.    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  His mother, brother, sister, and materal grandmother also have diabetic neuropathy.  His mother had three toes amputated and brother had BKA.   UPDATE 04/27/2017:   His EDX showed neuropathy affecting a length-dependent pattern also involving the hands, as well as a right ulnar neuropathy at the elbow.  He continues to have burning pain in the right arm and hand which wakes him up from sleeping. He also has achy pain over the medial elbow.  Lyrica 150mg  TID tends to provide adequate relief of his neuropathic pain in the feet about 75% of the time.   UPDATE 07/30/2017:  He scheduled sooner appointment due to worsening numbness of the hands.  He describes on instance where he was unable to feel the temperature of bath water for his son and almost scalded him. He cannot determine  temperature of items, such as hot pots/pans and his wife feels that he may endanger himself.  He endorses problems with fine motor tasks.  He has not suffered any falls and has started to use a cane more often.  His pain is controlled on Lyrica 150mg  TID and nortriptyline 20mg  qhs, but there is nothing that provides relief of his numbness.    UPDATE 09/28/2017:  He is here for follow-up visit.  He had NCS/EMG of the hands which showed worsening polyradiculoneuropathy in the hands.  CSF testing was declined by patient to look whether this is diabetic polyradiculoneuropathy vs immune-mediated, but my primary concern is with diabetes getting worse, this is diabetic polyradiculoneuropathy.  Fortunately, he has not noticed any new weakness.  His tingling is getting worse in the hands and is would like to make medication changes.  Currently, he takes Lyrica 150mg  TID and nortriptyline 20mg  at bedtime, both which he is tolerating well.  His HbA1c is trending upward to 12, despite being on an insulin pump.  Today, he also complains of a new left foot callous with ulceration.  He has not been successful in scheduling an appointment with podiatry.   Medications:  Current Outpatient Medications on File Prior to Visit  Medication Sig Dispense Refill  . benazepril (LOTENSIN) 40 MG tablet Take 1 tablet (40 mg total) by mouth daily. 90 tablet 3  . chlorthalidone (HYGROTON) 25 MG tablet take 1 tablet by mouth  once daily 30 tablet 1  . cloNIDine (CATAPRES - DOSED IN MG/24 HR) 0.1 mg/24hr patch apply 1 patch every week    . Continuous Blood Gluc Sensor (FREESTYLE LIBRE SENSOR SYSTEM) MISC APPLY TO UPPER ARM AND CHANGE SENSOR EVERY 10 DAYS    . empagliflozin (JARDIANCE) 25 MG TABS tablet Take 25 mg by mouth daily. 30 tablet 3  . furosemide (LASIX) 40 MG tablet Take 1.5 tablets (60 mg total) by mouth daily. 45 tablet 5  . glucose blood ([employer] TEST STRIPS) test strip Use to test blood sugar 1 time daily    . hydrALAZINE  (APRESOLINE) 25 MG tablet Take by mouth.    . insulin aspart (NOVOLOG) 100 UNIT/ML injection Use up to 150 units daily via pump 30 mL 3  . Insulin Glargine (LANTUS SOLOSTAR) 100 UNIT/ML Solostar Pen 50 units 2x daily 5 pen 0  . Insulin Pen Needle (FIFTY50 PEN NEEDLES) 31G X 5 MM MISC Use to inject insulin once daily    . LYRICA 150 MG capsule   0  . metFORMIN (GLUCOPHAGE-XR) 500 MG 24 hr tablet Take by mouth.    . Ostomy Supplies (SKIN TAC ADHESIVE BARRIER WIPE) MISC 1 Product by Does not apply route 2 days for 90 doses. Used to prepare the skin before applying Omnipod pump 50 each 0  . sildenafil (VIAGRA) 100 MG tablet Take by mouth.    . traMADol (ULTRAM) 50 MG tablet take 2 tablets by mouth every 6 hours if needed for pain 120 tablet 5  . VICTOZA 18 MG/3ML SOPN Inject 0.2 mLs (1.2 mg total) into the skin daily. Inject once daily at the same time 2 pen 3  . pravastatin (PRAVACHOL) 40 MG tablet Take 1 tablet (40 mg total) by mouth every evening. 90 tablet 3   Current Facility-Administered Medications on File Prior to Visit  Medication Dose Route Frequency Provider Last Rate Last Dose  . bupivacaine (MARCAINE) 0.5 % (with pres) injection 3 mL  3 mL Other Once Magnus Sinning, MD        Allergies:  Allergies  Allergen Reactions  . Vancomycin Other (See Comments)    AKI    Review of Systems:  CONSTITUTIONAL: No fevers, chills, night sweats, or weight loss.  EYES: No visual changes or eye pain ENT: No hearing changes.  No history of nose bleeds.   RESPIRATORY: No cough, wheezing and shortness of breath.   CARDIOVASCULAR: Negative for chest pain, and palpitations.   GI: Negative for abdominal discomfort, blood in stools or black stools.  No recent change in bowel habits.   GU:  No history of incontinence.   MUSCLOSKELETAL: No history of joint pain or swelling.  No myalgias.   SKIN: Negative for lesions, rash, and itching.   ENDOCRINE: Negative for cold or heat intolerance, polydipsia  or goiter.   PSYCH:  No depression or anxiety symptoms.   NEURO: As Above.   Vital Signs:  BP 140/90   Pulse 84   Ht 5' 9.5" (1.765 m)   Wt 266 lb (120.7 kg)   SpO2 98%   BMI 38.72 kg/m    General Medical Exam:   General:  Well appearing, comfortable  Neck: No carotid bruits. Respiratory:  Clear to auscultation, good air entry bilaterally.   Cardiac:  Regular rate and rhythm, no murmur.   Ext;  Left foot with gry lesion and callous at the base of the 1st toe.  No drainage is seen.   Neurological Exam: MENTAL  STATUS including orientation to time, place, person, recent and remote memory, attention span and concentration, language, and fund of knowledge is normal.  Speech is not dysarthric.  CRANIAL NERVES:  Pupils equal round and reactive to light.  Normal conjugate, extra-ocular eye movements in all directions of gaze.  No ptosis.  Face is symmetric. Palate elevates symmetrically.  Tongue is midline.  MOTOR:  Motor strength is 5/5 in all extremities, except distally finger abductors 4/5, toe extensors and flexors 4/5, left finger extensors 4/5 (worse). No pronator drift.     MSRs:  Reflexes are 2+/4 in the upper extremities, and absent in the legs  SENSORY:  Sensory exam shows diffusely absent pin prick and temperature throughout the entire arms and legs, vibration is absent in the legs and trace in the hands bilaterally  COORDINATION/GAIT:  Normal finger-to- nose-finger.  Intact rapid alternating movements bilaterally.  Gait narrow based and stable, assisted with cane.   Data: NCS/EMG of the right side 12/14/2016:   1. 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  Overall, these findings are severe in degree electrically. 2. 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  Labs 12/11/2017:  Vitamin B12 438, vitamin B1 8,  folate 8.5, copper 121, SPEP with IFE no M protein  NCS/EMG of the upper extremities 07/31/2017:  The electrophysiologic findings are most suggestive of a sensorimotor polyradiculoneuropathy affecting the upper extremities, moderate in degree electrically, which is new when compared to his previous study dated 12/31/2016. Alternatively, progression of sensorimotor demyelinating and axonal polyneuropathy with a superimposed ulnar neuropathy across the elbow (worse on the left), cannot be excluded. Correlate clinically.  MRI lumbar spine wo contrast 08/22/2017: 1. 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 The left foramen also appears mildly narrowed. 2. Mild left foraminal narrowing at L5-S1 without definite nerve root encroachment. 3. No other significant acquired disc space findings. Congenitally short pedicles.  Lab Results  Component Value Date   HGBA1C 12.3 (A) 09/17/2017   Lab Results  Component Value Date   CREATININE 1.13 09/17/2017   BUN 18 09/17/2017   NA 131 (L) 09/17/2017   K 4.2 09/17/2017   CL 98 09/17/2017   CO2 26 09/17/2017    IMPRESSION/PLAN: Diabetic polyradiculoneuropathy, worsening, in the setting of poorly-controlled diabetes (HbA1c 9.4 > 12).  He underwent NCS/EMG of the upper extremities in June 2019 due to subacute worsening of hand paresthesias.  Findings show there has been interval worsening of demyelinating and axonal neuropathy in the hands, when compared to his last study from November 2018 suggesting sensorimotor polyradiculoneuropathy.  Spinal tap declined by patient.  I discussed that symptoms are most likely due to diabetic polyradiculoneuropathy, less likely immune-mediated.  If there is worsening, plan to proceed with CSF testing (CSF cell count and diff, protein, glucose, IgG index, oligoclonal  bands, flow cytology).  In the meantime, I stressed the importance of working with endocrinology to keep his sugars optimally controlled.  Diabetic neuropathy affecting a stocking-glove distribution and with sensory ataxia.  Increase nortriptyline 30mg  at bedtime (QTc 413) Continue Lyrica 150mg  three times daily  Left foot lesion at the sole - no signs of infection, but there is a dry ulcerated callous.  Recommend close follow-up with podiatry  Greater than  50% of this 30 minute visit was spent in counseling, explanation of diagnosis, planning of further management, and coordination of care.    Thank you for allowing me to participate in patient's care.  If I can answer any additional questions, I would be pleased to do so.    Sincerely,    Wilgus Deyton K. Posey Pronto, DO

## 2017-09-28 NOTE — Telephone Encounter (Signed)
I did a referral to Dr. Wylene Simmer at [facility]

## 2017-09-28 NOTE — Patient Instructions (Addendum)
Increase nortriptyline to 30mg  at bedtime Continue Lyrica 150mg  three times daily Please check your foot daily.  Follow-up with podiatry  Return to clinic in 3 months

## 2017-09-28 NOTE — Telephone Encounter (Signed)
[employer] short acting form completed for Tramadol and faxed to [phone].

## 2017-10-01 ENCOUNTER — Ambulatory Visit: Payer: Medicaid Other | Admitting: Podiatry

## 2017-10-01 ENCOUNTER — Telehealth: Payer: Self-pay

## 2017-10-01 ENCOUNTER — Encounter: Payer: Self-pay | Admitting: Podiatry

## 2017-10-01 DIAGNOSIS — Q828 Other specified congenital malformations of skin: Secondary | ICD-10-CM

## 2017-10-01 DIAGNOSIS — M722 Plantar fascial fibromatosis: Secondary | ICD-10-CM | POA: Diagnosis not present

## 2017-10-01 DIAGNOSIS — E1140 Type 2 diabetes mellitus with diabetic neuropathy, unspecified: Secondary | ICD-10-CM

## 2017-10-01 DIAGNOSIS — E1149 Type 2 diabetes mellitus with other diabetic neurological complication: Principal | ICD-10-CM

## 2017-10-01 NOTE — Progress Notes (Signed)
Patient presentSubjective:   Patient ID: [name], male   DOB: 42 y.o.   MRN: [id number]   HPI Stated that the patient has a significant callus sub-first metatarsal left has had history of ulcerations and also has developed some pain in the right heel which is better but present   ROS      Objective:  Physical Exam  No change neurovascular status with severe keratotic lesion sub-first metatarsal head left localized in nature mild to moderate discomfort plantar aspect right heel     Assessment:  Keratotic lesion related to diabetic neuropathy with abnormal pressure points secondary to amputation and mild plantar fasciitis     Plan:  Discussed both conditions and for the plantar fasciitis continue with shoe gear modifications and for the lesion sharp sterile debridement accomplished today with no iatrogenic bleeding which can be done as needed

## 2017-10-01 NOTE — Telephone Encounter (Signed)
Spoke with pt and advised him we are not in-network with [facility] and cannot place any outside referrals. Suggested he contact his case worker for further information or to help him locate an in-network provider for orthopedic surgery. Nothing further needed at this time.

## 2017-10-03 ENCOUNTER — Telehealth: Payer: Self-pay

## 2017-10-03 NOTE — Telephone Encounter (Signed)
Fax from [facility]   * TRAMADOL*   Sent to [facility]

## 2017-10-16 ENCOUNTER — Telehealth: Payer: Self-pay | Admitting: Endocrinology

## 2017-10-16 ENCOUNTER — Other Ambulatory Visit (INDEPENDENT_AMBULATORY_CARE_PROVIDER_SITE_OTHER): Payer: Medicaid Other

## 2017-10-16 DIAGNOSIS — Z794 Long term (current) use of insulin: Secondary | ICD-10-CM

## 2017-10-16 DIAGNOSIS — E1165 Type 2 diabetes mellitus with hyperglycemia: Secondary | ICD-10-CM

## 2017-10-16 LAB — BASIC METABOLIC PANEL
BUN: 24 mg/dL — AB (ref 6–23)
CO2: 20 mEq/L (ref 19–32)
CREATININE: 1.17 mg/dL (ref 0.40–1.50)
Calcium: 9.3 mg/dL (ref 8.4–10.5)
Chloride: 96 mEq/L (ref 96–112)
GFR: 88.68 mL/min (ref >60.00)
Glucose, Bld: 536 mg/dL (ref 70–99)
Potassium: 4.5 mEq/L (ref 3.5–5.1)
Sodium: 127 mEq/L — ABNORMAL LOW (ref 135–145)

## 2017-10-17 ENCOUNTER — Telehealth: Payer: Self-pay | Admitting: Nutrition

## 2017-10-17 LAB — FRUCTOSAMINE: Fructosamine: 570 umol/L — ABNORMAL HIGH (ref 0–285)

## 2017-10-17 NOTE — Telephone Encounter (Signed)
error 

## 2017-10-17 NOTE — Telephone Encounter (Signed)
Patient reported that he is "still having trouble with his pump not giving insulin.  Says FBS today is 378.  Asked if he can see me today.  Michela Pitcher he would make some changes in his schedule and call me with a time that he can come in.  He was told to bring all of his pods/insulin.  He agreed to do this.

## 2017-10-21 NOTE — Progress Notes (Signed)
Patient ID: Danny Fuller, male   DOB: [birth year], 42 y.o.   MRN: [id number]            Reason for Appointment: Follow-up for Type 2 Diabetes and hypertension  Referring physician: Sarajane Jews   History of Present Illness:          Date of diagnosis of type 2 diabetes mellitus: 2007        Background history:   At diagnosis he was relatively asymptomatic and was started on metformin and glipizide He has been on the same oral hypoglycemic regimen for several years. Review of his A1c indicates it has been markedly increased persistently with the lowest reading in 2015 being 9.1  Recent history:   INSULIN regimen is:  OMNIPOD pump since 01/05/17  BASAL rate 12 AM = 3.0 and 7 AM = 6.5    Carbohydrate ratio 1:10, sensitivity 1:15 and target 120 Active insulin 3 hours  Non-insulin hypoglycemic drugs the patient is taking are: Metformin ER 2000 mg daily, Jardiance 25 mg daily, Victoza 1.2 mg daily  His A1c is markedly increased at 12.3, fructosamine over 500 now   Current management, blood sugar patterns and problems identified:  He was told to try the skin Tac to help keep his Omnipod on  However he has not been able to order this  Also he thinks that previously Humalog U-200 was causing the activation of the pumps and he is going to the NovoLog now  However he has not been able to use the pump consistently and has not had consistent blood sugars at all  Also currently his insulin pump time is off by 12 hours  Again he has gone up significantly on his basal rates during the day on his own  Despite this his blood sugars are usually well over 200 around noon and evening  Because of his pump having the wrong time programmed he is getting a basal rate of 6.5 overnight and also his lower basal rate is being delivered in the afternoon  He also thinks that his blood sugars may go up after eating sometimes and he will take an extra injection of NovoLog with the pen  He finds a pump  to be sticking better in his abdomen but when he is trying to walk he will have sweating and this may cause a pump unit to fall off especially on his arms  He has gone back to Victoza more regularly along with Jardiance        Side effects from medications have been: None  Compliance with the medical regimen: Fair  Glucose monitoring:  with freestyle meter on the Omnipod  Blood Glucose readings    PRE-MEAL Fasting Lunch Dinner Bedtime Overall  Glucose range:  74-363  152-403  187-310    Mean/median:        POST-MEAL PC Breakfast PC Lunch PC Dinner  Glucose range:    175-343  Mean/median:        Self-care: Meal times are:  Breakfast is at 9-10 am, Lunch-1 PM : Dinner: 8 pm   Typical meal intake: Breakfast is Usually toast and boiled eggs.  Usually getting cold cut sandwiches at lunch, grilled chicken and vegetables/potatoes at dinner.  Snacks are usually chips, crackers                Dietician visit, most recent: 05/02/16               Exercise:  some walking, limited by pain  Weight history: His highest weight in the past has been 378    Wt Readings from Last 3 Encounters:  10/22/17 268 lb (121.6 kg)  09/28/17 266 lb (120.7 kg)  09/17/17 265 lb 12.8 oz (120.6 kg)    Glycemic control:   Lab Results  Component Value Date   HGBA1C 12.3 (A) 09/17/2017   HGBA1C 9.4 (H) 05/31/2017   HGBA1C 8.5 02/21/2017   Lab Results  Component Value Date   MICROALBUR 60.9 (H) 05/31/2017   LDLCALC 51 03/23/2016   CREATININE 1.17 10/16/2017   Lab Results  Component Value Date   MICRALBCREAT 27.8 05/31/2017    Lab Results  Component Value Date   FRUCTOSAMINE 570 (H) 10/16/2017   FRUCTOSAMINE 380 (H) 07/12/2017   FRUCTOSAMINE 280 04/02/2017      Allergies as of 10/22/2017      Reactions   Vancomycin Other (See Comments)   AKI      Medication List        Accurate as of 10/22/17  9:51 AM. Always use your most recent med list.          benazepril 40 MG  tablet Commonly known as:  LOTENSIN Take 1 tablet (40 mg total) by mouth daily.   chlorthalidone 25 MG tablet Commonly known as:  HYGROTON take 1 tablet by mouth once daily   cloNIDine 0.1 mg/24hr patch Commonly known as:  CATAPRES - Dosed in mg/24 hr apply 1 patch every week   empagliflozin 25 MG Tabs tablet Commonly known as:  JARDIANCE Take 25 mg by mouth daily.   FIFTY50 PEN NEEDLES 31G X 5 MM Misc Generic drug:  Insulin Pen Needle Use to inject insulin once daily   FREESTYLE LIBRE SENSOR SYSTEM Misc APPLY TO UPPER ARM AND CHANGE SENSOR EVERY 10 DAYS   furosemide 40 MG tablet Commonly known as:  LASIX Take 1.5 tablets (60 mg total) by mouth daily.   hydrALAZINE 25 MG tablet Commonly known as:  APRESOLINE Take by mouth.   insulin aspart 100 UNIT/ML injection Commonly known as:  novoLOG Use up to 150 units daily via pump   Insulin Glargine 100 UNIT/ML Solostar Pen Commonly known as:  LANTUS 50 units 2x daily   [employer] TEST STRIPS test strip Generic drug:  glucose blood Use to test blood sugar 1 time daily   LYRICA 150 MG capsule Generic drug:  pregabalin   nortriptyline 10 MG capsule Commonly known as:  PAMELOR Take 3 tablet at bedtime.   pravastatin 40 MG tablet Commonly known as:  PRAVACHOL Take 1 tablet (40 mg total) by mouth every evening.   sildenafil 100 MG tablet Commonly known as:  VIAGRA Take by mouth.   SKIN TAC ADHESIVE BARRIER WIPE Misc 1 Product by Does not apply route 2 days for 90 doses. Used to prepare the skin before applying Omnipod pump   traMADol 50 MG tablet Commonly known as:  ULTRAM take 2 tablets by mouth every 6 hours if needed for pain   VICTOZA 18 MG/3ML Sopn Generic drug:  liraglutide Inject 0.2 mLs (1.2 mg total) into the skin daily. Inject once daily at the same time       Allergies:  Allergies  Allergen Reactions  . Vancomycin Other (See Comments)    AKI    Past Medical History:  Diagnosis Date  . Asthma    . Chronic kidney disease    sees Dr. Jamal Maes    . Diabetes mellitus    sees Dr. Dwyane Dee   .  GERD (gastroesophageal reflux disease)   . Headache(784.0)   . Hyperlipidemia   . Hypertension     Past Surgical History:  Procedure Laterality Date  . AMPUTATION Left 07/26/2015   Procedure: AMPUTATION LEFT FIFTH RAY;  Surgeon: [name], MD;  Location: [facility];  Service: Orthopedics;  Laterality: Left;  . bilateral hip pins placed    . INCISION AND DRAINAGE PERIRECTAL ABSCESS Right 03/07/2016   Procedure: IRRIGATION AND DEBRIDEMENT PERIRECTAL ABSCESS;  Surgeon: Alphonsa Overall, MD;  Location: [facility];  Service: General;  Laterality: Right;  . INCISION AND DRAINAGE PERIRECTAL ABSCESS Right 03/09/2016   Procedure: EXAM UNDER ANESTHESIA, IRRIGATION AND DEBRIDEMENT PERIRECTAL ABSCESS;  Surgeon: Johnathan Hausen, MD;  Location: [facility];  Service: General;  Laterality: Right;  Open, betadine packed wound    Family History  Problem Relation Age of Onset  . Arthritis Unknown   . Diabetes Unknown   . Hypertension Unknown   . Hyperlipidemia Unknown   . Stroke Unknown   . Sudden death Unknown   . Diabetes Mother   . Diabetes Sister   . Diabetes Brother   . Heart attack Father        Died ate 59, couple of "heart attacks"     Social History:  reports that he quit smoking about 2 years ago. His smoking use included cigarettes. He has a 10.00 pack-year smoking history. He has never used smokeless tobacco. He reports that he does not drink alcohol or use drugs.   Review of Systems   Lipid history: LDL has been below 100, mild increase in triglycerides present    Lab Results  Component Value Date   CHOL 144 07/12/2017   HDL 28.90 (L) 07/12/2017   LDLCALC 51 03/23/2016   LDLDIRECT 89.0 07/12/2017   TRIG 201.0 (H) 07/12/2017   CHOLHDL 5 07/12/2017           Hypertension: On treatment for several years His medications have been changed periodically including by his PCP  Currently he is  taking only benazepril and chlorthalidone, had been on doxazosin and clonidine patch before but not using these Blood pressure is consistently high now   BP Readings from Last 3 Encounters:  10/22/17 (!) 142/94  09/28/17 140/90  09/19/17 (!) 168/100    EDEMA: He has history of significant leg edema He is taking Lasix 60 mg prn for leg edema Recently has not had as much swelling, except on the left side    Lab Results  Component Value Date   CREATININE 1.17 10/16/2017   BUN 24 (H) 10/16/2017   NA 127 (L) 10/16/2017   K 4.5 10/16/2017   CL 96 10/16/2017   CO2 20 10/16/2017    He has sharp pains in his feet from neuropathy,  on Lyrica to control pain and numbness Followed by neurologist Also on nortriptyline at bedtime   He was recommended diabetic shoes by podiatrist   LABS:  Lab on 10/16/2017  Component Date Value Ref Range Status  . Fructosamine 10/16/2017 570* 0 - 285 umol/L Final   Comment: Published reference interval for apparently healthy subjects between age 19 and 33 is 43 - 285 umol/L and in a poorly controlled diabetic population is 228 - 563 umol/L with a mean of 396 umol/L.   Marland Kitchen Sodium 10/16/2017 127* 135 - 145 mEq/L Final  . Potassium 10/16/2017 4.5  3.5 - 5.1 mEq/L Final  . Chloride 10/16/2017 96  96 - 112 mEq/L Final  . CO2 10/16/2017 20  19 - 32 mEq/L Final  . Glucose, Bld 10/16/2017 536* 70 - 99 mg/dL Final  . BUN 10/16/2017 24* 6 - 23 mg/dL Final  . Creatinine, Ser 10/16/2017 1.17  0.40 - 1.50 mg/dL Final  . Calcium 10/16/2017 9.3  8.4 - 10.5 mg/dL Final  . GFR 10/16/2017 88.68  >60.00 mL/min Final    Physical Examination:  BP (!) 142/94   Pulse 86   Ht 5\' 10"  (1.778 m)   Wt 268 lb (121.6 kg)   SpO2 97%   BMI 38.45 kg/m     ASSESSMENT:  Diabetes type 2, uncontrolled, insulin-dependent   See history of present illness for detailed discussion of current diabetes management, blood sugar patterns and problems identified  His A1c  was 12.3 and now fructosamine is 570  He has his poor control of his blood sugars because of not being able to make his pump work or stick consistently Also has not been making efforts to get his skin Tac wipes for helping his pump stay on  Currently with using NovoLog and being more insulin resistant he appears to be needing about 170 units a day Also as discussed above his pump is programmed 12 hours forward on the time and is getting much more insulin overnight He keeps increasing his basal rate because of high sugars but his blood sugars are still mostly high especially in the afternoon and evenings This is despite continuing Jardiance and Victoza  HYPERTENSION: Blood pressure appears persistently worse without Catapres  Callus on his foot and significant neuropathy: He will be getting diabetic shoes  Hyponatremia: This is likely to be from his marked hyperglycemia but may be partly from chlorthalidone and will need follow-up  PLAN:    He will get the skin Tac from [facility] to prepare his abdomen area before applying the Omnipod pump He will program his pump to the right time Start using basal rate of 5.5 from midnight-10 AM and 6.5 subsequently Correction factor I: 10 He will need to follow-up with diabetes educator in 2 weeks Meal planning with dietitian to be done Continue regular exercise Call if blood sugars are not within target within the next week or so  CATAPRES patch to be restarted for his persistently high blood pressure  Counseling time on subjects discussed in assessment and plan sections is over 50% of today's 25 minute visit   There are no Patient Instructions on file for this visit.  [name] 10/22/2017, 9:51 AM   Note: This office note was prepared with Dragon voice recognition system technology. Any transcriptional errors that result from this process are unintentional.

## 2017-10-22 ENCOUNTER — Ambulatory Visit (INDEPENDENT_AMBULATORY_CARE_PROVIDER_SITE_OTHER): Payer: Medicaid Other | Admitting: Endocrinology

## 2017-10-22 ENCOUNTER — Encounter: Payer: Self-pay | Admitting: Endocrinology

## 2017-10-22 ENCOUNTER — Other Ambulatory Visit: Payer: Self-pay | Admitting: Endocrinology

## 2017-10-22 VITALS — BP 142/94 | HR 86 | Ht 70.0 in | Wt 268.0 lb

## 2017-10-22 DIAGNOSIS — Z794 Long term (current) use of insulin: Principal | ICD-10-CM

## 2017-10-22 DIAGNOSIS — E1165 Type 2 diabetes mellitus with hyperglycemia: Secondary | ICD-10-CM

## 2017-10-22 DIAGNOSIS — I10 Essential (primary) hypertension: Secondary | ICD-10-CM

## 2017-10-22 MED ORDER — CLONIDINE 0.1 MG/24HR TD PTWK
MEDICATED_PATCH | TRANSDERMAL | 3 refills | Status: DC
Start: 2017-10-22 — End: 2018-04-16

## 2017-10-29 ENCOUNTER — Other Ambulatory Visit: Payer: Self-pay | Admitting: Family Medicine

## 2017-10-29 ENCOUNTER — Other Ambulatory Visit: Payer: Self-pay | Admitting: Endocrinology

## 2017-10-31 NOTE — Telephone Encounter (Signed)
Dr. Fry please advise. Thanks  

## 2017-11-02 ENCOUNTER — Other Ambulatory Visit: Payer: Self-pay | Admitting: *Deleted

## 2017-11-02 MED ORDER — LYRICA 150 MG PO CAPS: 150.0000 mg | ORAL_CAPSULE | Freq: Three times a day (TID) | ORAL | 5 refills | Status: DC

## 2017-11-15 ENCOUNTER — Encounter: Payer: Self-pay | Admitting: Dietician

## 2017-11-15 ENCOUNTER — Encounter: Payer: Medicaid Other | Attending: Endocrinology | Admitting: Dietician

## 2017-11-15 DIAGNOSIS — Z794 Long term (current) use of insulin: Secondary | ICD-10-CM | POA: Insufficient documentation

## 2017-11-15 DIAGNOSIS — Z713 Dietary counseling and surveillance: Secondary | ICD-10-CM | POA: Insufficient documentation

## 2017-11-15 DIAGNOSIS — Z6841 Body Mass Index (BMI) 40.0 and over, adult: Secondary | ICD-10-CM | POA: Insufficient documentation

## 2017-11-15 DIAGNOSIS — E1165 Type 2 diabetes mellitus with hyperglycemia: Secondary | ICD-10-CM | POA: Diagnosis not present

## 2017-11-15 NOTE — Progress Notes (Signed)
Diabetes Self-Management Education  Visit Type: Follow-up  Appt. Start Time: 1015 Appt. End Time: 1610  11/15/2017  [name], identified by name and date of birth, is a 42 y.o. male with a diagnosis of Diabetes: Type 2 Diabetes since 2007.  Other history includes HTN, HLD, neuropathy, to amputation, GERD.   He has had an Omnipod Insulin Pump since 01/05/17.  He is considering switching to back to the V-Go as he is paying more for this and he states that he is having more problems with it not sticking, occluding, and deactivating more often.  We discussed this problems during the visit.  It will come off at [facility] practice and he does not bring his pump supplies or want to take the time to restart it.  It will occlude when he is sleeping and he does not want to be interupted.  He has not gotten Skin tac yet.   Medications include:  Novolog in the Pump, Lantus 2 units occasionally if his blood sugar is high, Jardiance, and Victoza.  He states that he does not know if the medications are helping him and he is tired of taking pills. He continues to get a low of about 65 2-3 times per week.  He is to update his pump settings.    He tests his blood sugar regularly but used to have a Libre and did better with this as he got much easier feedback regarding his blood sugar reading. Labs 10/16/2017:  A1C 12.3, Fructosamine 570.  Also noted that his vitamin B1 (thiamine) was 8- very low normal 1 1/2 years ago.   Patient lives with his wife and 18 yo son.  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  States that depression is better on current medication.  He eats out some.  His diet does not seem excessive in calories but is high in sodium.  He reports fluid retention and is on diuretics.  There were also errors in his carbohydrate counting.  After today's meeting, patient was able to demonstrate appropriate label reading and carbohydrate  counting.  ASSESSMENT  Height 5\' 9"  (1.753 m), weight 273 lb (123.8 kg). Body mass index is 40.32 kg/m.  Diabetes Self-Management Education - 11/15/17 1055      Visit Information   Visit Type  Follow-up      Psychosocial Assessment   Patient Belief/Attitude about Diabetes  Motivated to manage diabetes    Self-care barriers  None    Self-management support  Doctor's office;Family    Other persons present  Patient    Patient Concerns  Nutrition/Meal planning;Glycemic Control;Weight Control    Special Needs  None    Preferred Learning Style  No preference indicated    Learning Readiness  Ready    How often do you need to have someone help you when you read instructions, pamphlets, or other written materials from your doctor or pharmacy?  1 - Never    What is the last grade level you completed in school?  12th grade      Pre-Education Assessment   Patient understands the diabetes disease and treatment process.  Needs Review    Patient understands incorporating nutritional management into lifestyle.  Needs Review    Patient undertands incorporating physical activity into lifestyle.  Needs Review    Patient understands using medications safely.  Needs Review    Patient understands monitoring blood glucose, interpreting and using results  Needs Review  Patient understands prevention, detection, and treatment of acute complications.  Needs Review    Patient understands prevention, detection, and treatment of chronic complications.  Needs Review    Patient understands how to develop strategies to address psychosocial issues.  Needs Review    Patient understands how to develop strategies to promote health/change behavior.  Needs Review      Complications   Last HgB A1C per patient/outside source  12.3 %   10/22/17   How often do you check your blood sugar?  > 4 times/day    Fasting Blood glucose range (mg/dL)  <70;70-129    Postprandial Blood glucose range (mg/dL)  130-179    Number  of hypoglycemic episodes per month  6    Can you tell when your blood sugar is low?  Yes    What do you do if your blood sugar is low?  drinks coffee with artificial sweetener and sugar free creamer ("this brings my blood sugar up faster than juice")    Number of hyperglycemic episodes per week  3    Can you tell when your blood sugar is high?  Yes    What do you do if your blood sugar is high?  take extra insulin (novolog pen and occasional lantus pen)    Have you had a dilated eye exam in the past 12 months?  Yes    Have you had a dental exam in the past 12 months?  Yes    Are you checking your feet?  Yes    How many days per week are you checking your feet?  7      Dietary Intake   Breakfast  ham and cheese crosant from [place] sausage links, egg   7:30   Snack (morning)  none    Lunch  chef salad or cobb salad from [employer] and diet soda OR grilled chcken sandwich from [place] skips if at home "would rather sleep"   11:30-12   Snack (afternoon)  none    Dinner  shrimp, yellow rice, broccoli, cheese OR chicken, vegetable, instant mashed potatoes or rice   8-8:30   Snack (evening)  none    Beverage(s)  occasional coffee with sugar sub and sugar free creamer, Pepsi zero (2),  water      Exercise   Exercise Type  Light (walking / raking leaves)   coaching football   How many days per week to you exercise?  3    How many minutes per day do you exercise?  45    Total minutes per week of exercise  135      Patient Education   Previous Diabetes Education  Yes (please comment)   pump training this spring and RD 1 1/2 years ago   Nutrition management   Food label reading, portion sizes and measuring food.;Role of diet in the treatment of diabetes and the relationship between the three main macronutrients and blood glucose level;Carbohydrate counting;Information on hints to eating out and maintain blood glucose control.;Meal options for control of blood glucose level  and chronic complications.;Other (comment)   low sodium   Physical activity and exercise   Identified with patient nutritional and/or medication changes necessary with exercise.;Role of exercise on diabetes management, blood pressure control and cardiac health.    Medications  Reviewed patients medication for diabetes, action, purpose, timing of dose and side effects.    Monitoring  Yearly dilated eye exam;Daily foot exams    Acute  complications  Taught treatment of hypoglycemia - the 15 rule.    Psychosocial adjustment  Worked with patient to identify barriers to care and solutions;Role of stress on diabetes    Personal strategies to promote health  Lifestyle issues that need to be addressed for better diabetes care      Individualized Goals (developed by patient)   Nutrition  General guidelines for healthy choices and portions discussed;Follow meal plan discussed;Other (comment)   carb counting   Physical Activity  Exercise 3-5 times per week;45 minutes per day    Medications  take my medication as prescribed    Monitoring   test my blood glucose as discussed    Problem Solving  changing pod when occluded or other problems    Reducing Risk  examine blood glucose patterns;increase portions of healthy fats    Health Coping  discuss diabetes with (comment)   MD, RD, CDE     Patient Self-Evaluation of Goals - Patient rates self as meeting previously set goals (% of time)   Nutrition  50 - 75 %    Physical Activity  50 - 75 %    Medications  >75%    Monitoring  >75%    Problem Solving  25 - 50%    Reducing Risk  50 - 75 %    Health Coping  50 - 75 %      Post-Education Assessment   Patient understands the diabetes disease and treatment process.  Demonstrates understanding / competency    Patient understands incorporating nutritional management into lifestyle.  Demonstrates understanding / competency    Patient undertands incorporating physical activity into lifestyle.  Demonstrates  understanding / competency    Patient understands using medications safely.  Demonstrates understanding / competency    Patient understands monitoring blood glucose, interpreting and using results  Demonstrates understanding / competency    Patient understands prevention, detection, and treatment of acute complications.  Demonstrates understanding / competency    Patient understands prevention, detection, and treatment of chronic complications.  Demonstrates understanding / competency    Patient understands how to develop strategies to address psychosocial issues.  Demonstrates understanding / competency    Patient understands how to develop strategies to promote health/change behavior.  Demonstrates understanding / competency      Outcomes   Expected Outcomes  Demonstrated interest in learning. Expect positive outcomes    Future DMSE  PRN    Program Status  Completed      Subsequent Visit   Since your last visit have you continued or begun to take your medications as prescribed?  Yes    Since your last visit have you had your blood pressure checked?  Yes       Individualized Plan for Diabetes Self-Management Training:   Learning Objective:  Patient will have a greater understanding of diabetes self-management. Patient education plan is to attend individual and/or group sessions per assessed needs and concerns.   Plan:   Patient Instructions  Increased stick products on Amazon  Skin Tac  Rockadex  Skin tac Other option:  Antiperspirant around but not on the insertion site  Consider taking a B-complex vitamin  Download Calorie King app Be mindful about sodium when you eat out.  Read the labels for carbohydrate, sodium, and fat. Continue to count carbohydrates.   Expected Outcomes:  Demonstrated interest in learning. Expect positive outcomes  Education material provided: Meal plan card, low sodium nutrition therapy from AND, 1800 calorie 2 week sample meal plan  If  problems or questions, patient to contact team via:  Phone  Future DSME appointment: PRN

## 2017-11-15 NOTE — Patient Instructions (Addendum)
Increased stick products on [employer]  Skin Tac  Rockadex  Skin tac Other option:  Antiperspirant around but not on the insertion site  Consider taking a B-complex vitamin  Download Calorie King app Be mindful about sodium when you eat out.  Read the labels for carbohydrate, sodium, and fat. Continue to count carbohydrates.

## 2017-11-27 ENCOUNTER — Other Ambulatory Visit: Payer: Self-pay | Admitting: Endocrinology

## 2017-11-28 ENCOUNTER — Other Ambulatory Visit: Payer: Self-pay

## 2017-11-29 ENCOUNTER — Other Ambulatory Visit: Payer: Self-pay | Admitting: *Deleted

## 2017-11-29 MED ORDER — PREGABALIN 150 MG PO CAPS: 150.0000 mg | ORAL_CAPSULE | Freq: Three times a day (TID) | ORAL | 5 refills | Status: DC

## 2017-12-06 ENCOUNTER — Telehealth: Payer: Self-pay | Admitting: Endocrinology

## 2017-12-06 NOTE — Telephone Encounter (Signed)
Incoming fax from [place], [place] for insulin pump supplies. Due to Dwyane Dee not being in office fax has been signed by doctor on Call, Dwight,. Form faxed to [phone]. Received successful confirmation.

## 2017-12-25 ENCOUNTER — Other Ambulatory Visit: Payer: Self-pay | Admitting: Neurology

## 2017-12-25 ENCOUNTER — Other Ambulatory Visit: Payer: Self-pay | Admitting: Endocrinology

## 2017-12-25 DIAGNOSIS — E1165 Type 2 diabetes mellitus with hyperglycemia: Secondary | ICD-10-CM

## 2017-12-25 DIAGNOSIS — Z794 Long term (current) use of insulin: Principal | ICD-10-CM

## 2017-12-27 ENCOUNTER — Other Ambulatory Visit (INDEPENDENT_AMBULATORY_CARE_PROVIDER_SITE_OTHER): Payer: Medicaid Other

## 2017-12-27 ENCOUNTER — Other Ambulatory Visit: Payer: Self-pay | Admitting: *Deleted

## 2017-12-27 ENCOUNTER — Telehealth: Payer: Self-pay | Admitting: Endocrinology

## 2017-12-27 DIAGNOSIS — E1165 Type 2 diabetes mellitus with hyperglycemia: Secondary | ICD-10-CM

## 2017-12-27 DIAGNOSIS — Z794 Long term (current) use of insulin: Secondary | ICD-10-CM | POA: Diagnosis not present

## 2017-12-27 LAB — BASIC METABOLIC PANEL
BUN: 20 mg/dL (ref 6–23)
CALCIUM: 9.6 mg/dL (ref 8.4–10.5)
CO2: 25 meq/L (ref 19–32)
Chloride: 100 mEq/L (ref 96–112)
Creatinine, Ser: 1.06 mg/dL (ref 0.40–1.50)
GFR: 99.28 mL/min (ref >60.00)
Glucose, Bld: 373 mg/dL — ABNORMAL HIGH (ref 70–99)
Potassium: 4.3 mEq/L (ref 3.5–5.1)
SODIUM: 132 meq/L — AB (ref 135–145)

## 2017-12-27 LAB — HEMOGLOBIN A1C: HEMOGLOBIN A1C: 10.6 % — AB (ref 4.6–6.5)

## 2017-12-27 MED ORDER — NORTRIPTYLINE HCL 10 MG PO CAPS: ORAL_CAPSULE | ORAL | 1 refills | Status: DC

## 2017-12-27 NOTE — Telephone Encounter (Signed)
error 

## 2018-01-01 ENCOUNTER — Ambulatory Visit: Payer: Medicaid Other | Admitting: Endocrinology

## 2018-01-01 ENCOUNTER — Encounter: Payer: Self-pay | Admitting: Family Medicine

## 2018-01-01 ENCOUNTER — Ambulatory Visit (INDEPENDENT_AMBULATORY_CARE_PROVIDER_SITE_OTHER): Payer: Medicaid Other | Admitting: Family Medicine

## 2018-01-01 VITALS — BP 148/90 | HR 98 | Temp 98.0°F | Wt 286.2 lb

## 2018-01-01 DIAGNOSIS — G8929 Other chronic pain: Secondary | ICD-10-CM | POA: Diagnosis not present

## 2018-01-01 DIAGNOSIS — M5441 Lumbago with sciatica, right side: Secondary | ICD-10-CM

## 2018-01-01 DIAGNOSIS — Z23 Encounter for immunization: Secondary | ICD-10-CM | POA: Diagnosis not present

## 2018-01-01 DIAGNOSIS — M5442 Lumbago with sciatica, left side: Secondary | ICD-10-CM

## 2018-01-01 MED ORDER — TRAMADOL HCL 50 MG PO TABS: 50.0000 mg | ORAL_TABLET | Freq: Four times a day (QID) | ORAL | 5 refills | Status: DC | PRN

## 2018-01-01 MED ORDER — FUROSEMIDE 40 MG PO TABS: 60.0000 mg | ORAL_TABLET | Freq: Every day | ORAL | 11 refills | Status: DC

## 2018-01-01 NOTE — Progress Notes (Signed)
   Subjective:    Patient ID: EDIN KON, male    DOB: 01/03/1976, 42 y.o.   MRN: [id number]  HPI Here to follow up on low back pain. He has been seeing Dr. Laurence Spates for this and a recent lumbar MRI revealed some lumbar stenosis. He referred Rod back to [name] for pain medications. He says that the combination of Tramadol and Lyrica actually works fairly well for pain control.    Review of Systems  Constitutional: Negative.   Respiratory: Negative.   Cardiovascular: Negative.   Musculoskeletal: Positive for back pain.  Neurological: Positive for numbness.       Objective:   Physical Exam  Constitutional: He is oriented to person, place, and time. He appears well-developed and well-nourished.  Cardiovascular: Normal rate, regular rhythm, normal heart sounds and intact distal pulses.  Pulmonary/Chest: Effort normal and breath sounds normal.  Neurological: He is alert and oriented to person, place, and time.          Assessment & Plan:  Low back pain. We refilled the Tramadol.  [name], MD

## 2018-01-02 ENCOUNTER — Encounter: Payer: Self-pay | Admitting: Podiatry

## 2018-01-02 ENCOUNTER — Ambulatory Visit (INDEPENDENT_AMBULATORY_CARE_PROVIDER_SITE_OTHER): Payer: Medicaid Other | Admitting: Podiatry

## 2018-01-02 DIAGNOSIS — L84 Corns and callosities: Secondary | ICD-10-CM

## 2018-01-02 DIAGNOSIS — E1140 Type 2 diabetes mellitus with diabetic neuropathy, unspecified: Secondary | ICD-10-CM

## 2018-01-02 DIAGNOSIS — E1149 Type 2 diabetes mellitus with other diabetic neurological complication: Secondary | ICD-10-CM

## 2018-01-02 DIAGNOSIS — S98139A Complete traumatic amputation of one unspecified lesser toe, initial encounter: Secondary | ICD-10-CM

## 2018-01-02 NOTE — Progress Notes (Signed)
Subjective:   Patient ID: Danny Fuller, male   DOB: 42 y.o.   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  Patient will have diabetic shoes made and is going to be sent for approval

## 2018-01-02 NOTE — Progress Notes (Addendum)
Patient ID: Danny Fuller, male   DOB: September 17, 1975, 42 y.o.   MRN: [id number]            Reason for Appointment: Follow-up for Type 2 Diabetes and hypertension  Referring physician: Sarajane Jews   History of Present Illness:          Date of diagnosis of type 2 diabetes mellitus: 2007        Background history:   At diagnosis he was relatively asymptomatic and was started on metformin and glipizide He has been on the same oral hypoglycemic regimen for several years. Review of his A1c indicates it has been markedly increased persistently with the lowest reading in 2015 being 9.1  Recent history:   INSULIN regimen is:  OMNIPOD pump since 01/05/17  BASAL rate 5.5  Carbohydrate ratio 1:10, sensitivity 1:15 and target 120 Active insulin 3 hours  Non-insulin hypoglycemic drugs the patient is taking are: Metformin ER 2000 mg daily, Jardiance 25 mg daily, Victoza 0-1.2 mg daily  His A1c is markedly increased at 12.3, fructosamine over 500 now   Current management, blood sugar patterns and problems identified:  He has been using Novolog insulin in his pump and he says that with this he has no difficulty with pump alarms and is able to keep the insulin pump on for a day and a half at least  However not clear why he has change his basal rate to the same all throughout the day, was supposed to have a higher basal rate in the evenings  Blood sugars are erratic but he thinks this is because he runs out of insulin after 20 days and has not been bolusing consistently  Also checking blood sugars somewhat erratically at times  On some of the days his blood sugars are fairly good including in the evening  However no consistent pattern seen with the fasting readings with some low, some normal and some high readings FASTING  Most of his low sugars appear to be from overcorrection of high readings the night before with his current correction factor  He still can do better with bolusing with every  meal and putting in carbohydrates for what he is eating  Did not adjust for higher fat intake when he went out at the restaurant on Tuesday night with blood sugar going up to 328 postprandially  Currently not active at all  He says he is not taking his Victoza because he forgets to take it out of the refrigerator  Taking Jardiance regularly        Side effects from medications have been: None  Compliance with the medical regimen: Fair  Glucose monitoring:  with freestyle meter on the Omnipod  Blood Glucose readings are being checked up to 4 times a day and blood sugars are being used to adjust insulin  AVERAGE 184  Average blood sugar at any given time ranges between 146 from 2 to 4 PM up to 289 after 10 p.m.  Overall blood sugar range 56-386 with no consistent pattern   Self-care: Meal times are:  Breakfast is at 9-10 am, Lunch-1 PM : Dinner: 8 pm   Typical meal intake: Breakfast is Usually toast and boiled eggs.  Usually getting cold cut sandwiches at lunch, grilled chicken and vegetables/potatoes at dinner.  Snacks are usually chips, crackers                Dietician visit, most recent: 05/02/16  Exercise:  some walking, limited by pain  Weight history: His highest weight in the past has been 378    Wt Readings from Last 3 Encounters:  01/03/18 279 lb (126.6 kg)  01/01/18 286 lb 4 oz (129.8 kg)  11/15/17 273 lb (123.8 kg)    Glycemic control:   Lab Results  Component Value Date   HGBA1C 10.6 (H) 12/27/2017   HGBA1C 12.3 (A) 09/17/2017   HGBA1C 9.4 (H) 05/31/2017   Lab Results  Component Value Date   MICROALBUR 60.9 (H) 05/31/2017   LDLCALC 51 03/23/2016   CREATININE 1.06 12/27/2017   Lab Results  Component Value Date   MICRALBCREAT 27.8 05/31/2017    Lab Results  Component Value Date   FRUCTOSAMINE 570 (H) 10/16/2017   FRUCTOSAMINE 380 (H) 07/12/2017   FRUCTOSAMINE 280 04/02/2017      Allergies as of 01/03/2018      Reactions    Vancomycin Other (See Comments)   AKI      Medication List        Accurate as of 01/03/18 10:26 AM. Always use your most recent med list.          benazepril 40 MG tablet Commonly known as:  LOTENSIN Take 1 tablet (40 mg total) by mouth daily.   chlorthalidone 25 MG tablet Commonly known as:  HYGROTON take 1 tablet by mouth once daily   cloNIDine 0.1 mg/24hr patch Commonly known as:  CATAPRES - Dosed in mg/24 hr apply 1 patch every week   empagliflozin 25 MG Tabs tablet Commonly known as:  JARDIANCE Take 25 mg by mouth daily.   FIFTY50 PEN NEEDLES 31G X 5 MM Misc Generic drug:  Insulin Pen Needle Use to inject insulin once daily   FREESTYLE LIBRE SENSOR SYSTEM Misc APPLY TO UPPER ARM AND CHANGE SENSOR EVERY 10 DAYS   furosemide 40 MG tablet Commonly known as:  LASIX Take 1.5 tablets (60 mg total) by mouth daily.   hydrALAZINE 25 MG tablet Commonly known as:  APRESOLINE Take by mouth.   insulin aspart 100 UNIT/ML injection Commonly known as:  novoLOG INJECT UNDER THE SKIN UPTO 150 UNITS VIA PUMP   Insulin Glargine 100 UNIT/ML Solostar Pen Commonly known as:  LANTUS Administer 50u SQ bid, DX: E11.65; Z79.4   [employer] TEST STRIPS test strip Generic drug:  glucose blood Use to test blood sugar 1 time daily   LYRICA 150 MG capsule Generic drug:  pregabalin Take 1 capsule (150 mg total) by mouth 3 (three) times daily.   nortriptyline 10 MG capsule Commonly known as:  PAMELOR TAKE 3 CAPSULES BY MOUTH AT BEDTIME   pravastatin 40 MG tablet Commonly known as:  PRAVACHOL Take 1 tablet (40 mg total) by mouth every evening.   SKIN TAC ADHESIVE BARRIER WIPE Misc 1 Product by Does not apply route 2 days for 90 doses. Used to prepare the skin before applying Omnipod pump   traMADol 50 MG tablet Commonly known as:  ULTRAM Take 1 tablet (50 mg total) by mouth every 6 (six) hours as needed for moderate pain.   VICTOZA 18 MG/3ML Sopn Generic drug:   liraglutide Inject 0.2 mLs (1.2 mg total) into the skin daily. Inject once daily at the same time       Allergies:  Allergies  Allergen Reactions  . Vancomycin Other (See Comments)    AKI    Past Medical History:  Diagnosis Date  . Asthma   . Chronic kidney disease  sees Dr. Jamal Maes    . Diabetes mellitus    sees Dr. Dwyane Dee   . GERD (gastroesophageal reflux disease)   . Headache(784.0)   . Hyperlipidemia   . Hypertension     Past Surgical History:  Procedure Laterality Date  . AMPUTATION Left 07/26/2015   Procedure: AMPUTATION LEFT FIFTH RAY;  Surgeon: [name], MD;  Location: [place];  Service: Orthopedics;  Laterality: Left;  . bilateral hip pins placed    . INCISION AND DRAINAGE PERIRECTAL ABSCESS Right 03/07/2016   Procedure: IRRIGATION AND DEBRIDEMENT PERIRECTAL ABSCESS;  Surgeon: Alphonsa Overall, MD;  Location: [facility];  Service: General;  Laterality: Right;  . INCISION AND DRAINAGE PERIRECTAL ABSCESS Right 03/09/2016   Procedure: EXAM UNDER ANESTHESIA, IRRIGATION AND DEBRIDEMENT PERIRECTAL ABSCESS;  Surgeon: Johnathan Hausen, MD;  Location: [facility];  Service: General;  Laterality: Right;  Open, betadine packed wound    Family History  Problem Relation Age of Onset  . Arthritis Unknown   . Diabetes Unknown   . Hypertension Unknown   . Hyperlipidemia Unknown   . Stroke Unknown   . Sudden death Unknown   . Diabetes Mother   . Diabetes Sister   . Diabetes Brother   . Heart attack Father        Died ate 10, couple of "heart attacks"     Social History:  reports that he quit smoking about 2 years ago. His smoking use included cigarettes. He has a 10.00 pack-year smoking history. He has never used smokeless tobacco. He reports that he does not drink alcohol or use drugs.   Review of Systems   Lipid history: LDL has been below 100, mild increase in triglycerides present Has been started on pravastatin by cardiologist    Lab Results  Component Value Date    CHOL 144 07/12/2017   HDL 28.90 (L) 07/12/2017   LDLCALC 51 03/23/2016   LDLDIRECT 89.0 07/12/2017   TRIG 201.0 (H) 07/12/2017   CHOLHDL 5 07/12/2017           Hypertension: On treatment for several years His medications have been changed periodically including by his PCP  Currently he is taking only benazepril and chlorthalidone along with the 0.1 mg clonidine patch  Blood pressure is consistently high He says that diastolic at home recently was 87  BP Readings from Last 3 Encounters:  01/03/18 (!) 146/98  01/01/18 (!) 148/90  10/22/17 (!) 142/94    EDEMA: He has history of significant leg edema He is taking Lasix 60 mg recently for leg edema This has improved his swelling    Lab Results  Component Value Date   CREATININE 1.06 12/27/2017   BUN 20 12/27/2017   NA 132 (L) 12/27/2017   K 4.3 12/27/2017   CL 100 12/27/2017   CO2 25 12/27/2017    He has sharp pains in his feet from neuropathy,  on Lyrica to control pain and numbness Followed by neurologist Also on nortriptyline at bedtime   He was recommended diabetic shoes by podiatrist   LABS:  No visits with results within 1 Week(s) from this visit.  Latest known visit with results is:  Lab on 12/27/2017  Component Date Value Ref Range Status  . Sodium 12/27/2017 132* 135 - 145 mEq/L Final  . Potassium 12/27/2017 4.3  3.5 - 5.1 mEq/L Final  . Chloride 12/27/2017 100  96 - 112 mEq/L Final  . CO2 12/27/2017 25  19 - 32 mEq/L Final  . Glucose,  Bld 12/27/2017 373* 70 - 99 mg/dL Final  . BUN 12/27/2017 20  6 - 23 mg/dL Final  . Creatinine, Ser 12/27/2017 1.06  0.40 - 1.50 mg/dL Final  . Calcium 12/27/2017 9.6  8.4 - 10.5 mg/dL Final  . GFR 12/27/2017 99.28  >60.00 mL/min Final  . Hgb A1c MFr Bld 12/27/2017 10.6* 4.6 - 6.5 % Final   Glycemic Control Guidelines for People with Diabetes:Non Diabetic:  <6%Goal of Therapy: <7%Additional Action Suggested:  >8%     Physical Examination:  BP (!) 146/98   Pulse  (!) 106   Ht 5\' 9"  (1.753 m)   Wt 279 lb (126.6 kg)   SpO2 99%   BMI 41.20 kg/m     ASSESSMENT:  Diabetes type 2, uncontrolled, insulin-dependent   See history of present illness for detailed discussion of current diabetes management, blood sugar patterns and problems identified  His A1c was 12.3 and now 10.6  Most of his difficulties are with getting consistent supplies for his insulin, his insulin pump and also not taking his Victoza as directed As discussed above his basal rate is probably adequate for now but need more data to establish a pattern Also he can do better with exercise which he may be starting with water exercises Hypoglycemia is mostly related to overcorrection of high readings at night   HYPERTENSION: Blood pressure appears persistently high Also with his tachycardia possibly from nortriptyline he needs to add a beta-blocker  Lipid treatment: Have encouraged him to stay on pravastatin for cardiovascular benefits  Hyponatremia: This is mild and from chlorthalidone, asymptomatic  PLAN:    This prescription for insulin will be increased to reflect using 150 units a day and 5 vials per month Once he is on a regular schedule with his insulin pumps and able to get his insulin he will need to bolus for every meal consistently and also for snacks He needs to enter carbohydrates with every meal and snack into the pump and bolus Will make sure he adds at least 20 to 30 g carbohydrate when he is eating any higher fat meals especially at restaurants Sensitivity changed to 1: 20 from 10 PM until 6 AM Needs reassessment of his basal rates and boluses once he is on a regular program of using the pump and bolusing Since he is monitoring 4 times a day he will be eligible for the freestyle libre when he goes on Medicare and this will improve compliance also  He will leave Victoza out on the counter instead of in the refrigerator to make sure he takes it every day  For his  hypertension and sinus tachycardia he will start bisoprolol 5 mg daily, needs better control  Counseling time on subjects discussed in assessment and plan sections is over 50% of today's 25 minute visit   There are no Patient Instructions on file for this visit.  [name] 01/03/2018, 10:26 AM   Note: This office note was prepared with Dragon voice recognition system technology. Any transcriptional errors that result from this process are unintentional.

## 2018-01-03 ENCOUNTER — Encounter: Payer: Self-pay | Admitting: Endocrinology

## 2018-01-03 ENCOUNTER — Ambulatory Visit (INDEPENDENT_AMBULATORY_CARE_PROVIDER_SITE_OTHER): Payer: Medicaid Other | Admitting: Endocrinology

## 2018-01-03 VITALS — BP 160/90 | HR 106 | Ht 69.0 in | Wt 279.0 lb

## 2018-01-03 DIAGNOSIS — E1165 Type 2 diabetes mellitus with hyperglycemia: Secondary | ICD-10-CM

## 2018-01-03 DIAGNOSIS — Z794 Long term (current) use of insulin: Secondary | ICD-10-CM | POA: Diagnosis not present

## 2018-01-03 DIAGNOSIS — I10 Essential (primary) hypertension: Secondary | ICD-10-CM

## 2018-01-03 MED ORDER — INSULIN ASPART 100 UNIT/ML ~~LOC~~ SOLN: SUBCUTANEOUS | 0 refills | Status: DC

## 2018-01-03 MED ORDER — INSULIN ASPART 100 UNIT/ML ~~LOC~~ SOLN: SUBCUTANEOUS | 1 refills | Status: DC

## 2018-01-03 MED ORDER — BISOPROLOL FUMARATE 5 MG PO TABS: 5.0000 mg | ORAL_TABLET | Freq: Every day | ORAL | 2 refills | Status: DC

## 2018-01-03 NOTE — Patient Instructions (Signed)
Re-start Victoza 

## 2018-01-05 ENCOUNTER — Encounter: Payer: Self-pay | Admitting: Family Medicine

## 2018-01-07 ENCOUNTER — Other Ambulatory Visit: Payer: Self-pay

## 2018-01-07 MED ORDER — LABETALOL HCL 100 MG PO TABS: 100.0000 mg | ORAL_TABLET | Freq: Two times a day (BID) | ORAL | 3 refills | Status: DC

## 2018-01-07 NOTE — Telephone Encounter (Signed)
Dr. Sarajane Jews please advise on the authorization to refill the tramadol.  Thanks

## 2018-01-08 NOTE — Telephone Encounter (Signed)
Please do the PA  

## 2018-01-09 ENCOUNTER — Telehealth: Payer: Self-pay

## 2018-01-09 NOTE — Telephone Encounter (Signed)
received a fax from [facility] asking to initiate PA. Called [employer] and was told to print and fill out a form and fax it back. Form has been completed and faxed.

## 2018-01-28 ENCOUNTER — Ambulatory Visit (INDEPENDENT_AMBULATORY_CARE_PROVIDER_SITE_OTHER): Payer: Medicare Other | Admitting: Neurology

## 2018-01-28 ENCOUNTER — Encounter: Payer: Self-pay | Admitting: Neurology

## 2018-01-28 VITALS — BP 170/90 | HR 96 | Ht 70.0 in | Wt 284.1 lb

## 2018-01-28 DIAGNOSIS — Z79899 Other long term (current) drug therapy: Secondary | ICD-10-CM

## 2018-01-28 MED ORDER — NORTRIPTYLINE HCL 10 MG PO CAPS: ORAL_CAPSULE | ORAL | 1 refills | Status: DC

## 2018-01-28 NOTE — Progress Notes (Signed)
Follow-up Visit   Date: 01/28/18    Danny Fuller MRN: [id number] DOB: [birth year]   Interim History: Leanord Fuller is a 42 y.o. left-handed African American male with diabetes mellitus, GERD, and hypertension returning to the clinic for follow-up of diabetic neuropathy.  The patient was accompanied to the clinic by self.  History of present illness: He was diagnosed with diabetes in 2002 and began having having numbness of the feet in 2016.  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  Numbness involves the entire feet, which is worse on the left.  He also complain of tingling involving the tingling and lower legs.  He takes Lyrica 150mg  twice daily which provides about 35% relief and allows him to sleep.  He has tried gabapentin, which did not provide any relief.   He also complains of numbness and tingling over the upper arm and into his finger tips, which is worse on the right.  He has weakness of the hands and frequently drops objects.  He also complains of achy neck pain.    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  His mother, brother, sister, and materal grandmother also have diabetic neuropathy.  His mother had three toes amputated and brother had BKA.   EDX in November 2018 showed neuropathy affecting a length-dependent pattern also involving the hands, as well as a right ulnar neuropathy at the elbow.   His pain is controlled on Lyrica 150mg  TID and nortriptyline 20mg  qhs, but there is nothing that provides relief of his numbness.   Repeat NCS/EMG in 2019 showed worsening polyradiculoneuropathy in the hands.  CSF testing was declined by patient to look whether this is diabetic polyradiculoneuropathy vs immune-mediated, but my primary concern is with diabetes getting worse, this is diabetic polyradiculoneuropathy.    UPDATE 01/28/2018:  He is here for follow-up visit.  He he has more  numbness of the hands and has noticed less tingling.  At night time, he continues to have stabbing pain in addition to constant numbness of the feet.  Neuropathy is no worse than before, he denies any falls or new weakness.  His blood sugars remain poorly controlled, he partly attributes this to pump malfunction a few months ago.   Medications:  Current Outpatient Medications on File Prior to Visit  Medication Sig Dispense Refill  . benazepril (LOTENSIN) 40 MG tablet Take 1 tablet (40 mg total) by mouth daily. 90 tablet 3  . chlorthalidone (HYGROTON) 25 MG tablet take 1 tablet by mouth once daily 30 tablet 1  . cloNIDine (CATAPRES - DOSED IN MG/24 HR) 0.1 mg/24hr patch apply 1 patch every week 4 patch 3  . Continuous Blood Gluc Sensor (FREESTYLE LIBRE SENSOR SYSTEM) MISC APPLY TO UPPER ARM AND CHANGE SENSOR EVERY 10 DAYS    . empagliflozin (JARDIANCE) 25 MG TABS tablet Take 25 mg by mouth daily. 30 tablet 3  . furosemide (LASIX) 40 MG tablet Take 1.5 tablets (60 mg total) by mouth daily. 45 tablet 11  . glucose blood ([employer] TEST STRIPS) test strip Use to test blood sugar 1 time daily    . insulin aspart (NOVOLOG) 100 UNIT/ML injection INJECT UNDER THE SKIN UPTO 170 UNITS VIA PUMP 50 mL 1  . Insulin Glargine (LANTUS SOLOSTAR) 100 UNIT/ML Solostar Pen Administer 50u SQ bid, DX: E11.65; Z79.4 15 mL 1  . Insulin Pen Needle (FIFTY50 PEN NEEDLES) 31G X  5 MM MISC Use to inject insulin once daily    . labetalol (NORMODYNE) 100 MG tablet Take 1 tablet (100 mg total) by mouth 2 (two) times daily. TAKE 1 TABLET BY MOUTH TWICE DAILY. 60 tablet 3  . LYRICA 150 MG capsule Take 1 capsule (150 mg total) by mouth 3 (three) times daily. 90 capsule 5  . Ostomy Supplies (SKIN TAC ADHESIVE BARRIER WIPE) MISC 1 Product by Does not apply route 2 days for 90 doses. Used to prepare the skin before applying Omnipod pump 50 each 0  . traMADol (ULTRAM) 50 MG tablet Take 1 tablet (50 mg total) by mouth every 6 (six) hours as  needed for moderate pain. 120 tablet 5  . VICTOZA 18 MG/3ML SOPN Inject 0.2 mLs (1.2 mg total) into the skin daily. Inject once daily at the same time 2 pen 3  . pravastatin (PRAVACHOL) 40 MG tablet Take 1 tablet (40 mg total) by mouth every evening. 90 tablet 3   Current Facility-Administered Medications on File Prior to Visit  Medication Dose Route Frequency Provider Last Rate Last Dose  . bupivacaine (MARCAINE) 0.5 % (with pres) injection 3 mL  3 mL Other Once Magnus Sinning, MD        Allergies:  Allergies  Allergen Reactions  . Vancomycin Other (See Comments)    AKI    Review of Systems:  CONSTITUTIONAL: No fevers, chills, night sweats, or weight loss.  EYES: No visual changes or eye pain ENT: No hearing changes.  No history of nose bleeds.   RESPIRATORY: No cough, wheezing and shortness of breath.   CARDIOVASCULAR: Negative for chest pain, and palpitations.   GI: Negative for abdominal discomfort, blood in stools or black stools.  No recent change in bowel habits.   GU:  No history of incontinence.   MUSCLOSKELETAL: No history of joint pain or swelling.  No myalgias.   SKIN: Negative for lesions, rash, and itching.   ENDOCRINE: Negative for cold or heat intolerance, polydipsia or goiter.   PSYCH:  No depression or anxiety symptoms.   NEURO: As Above.   Vital Signs:  BP (!) 170/90   Pulse 96   Ht 5\' 10"  (1.778 m)   Wt 284 lb 2 oz (128.9 kg)   SpO2 99%   BMI 40.77 kg/m    General Medical Exam:   General:  Well appearing, comfortable  Eyes/ENT: see cranial nerve examination.   Neck: No masses appreciated.  Full range of motion without tenderness.  No carotid bruits. Respiratory:  Clear to auscultation, good air entry bilaterally.   Cardiac:  Regular rate and rhythm, no murmur.   Ext:  S/p 5th toe amputation on the left foot.  No lesion over the sole  Neurological Exam: MENTAL STATUS including orientation to time, place, person, recent and remote memory, attention  span and concentration, language, and fund of knowledge is normal.  Speech is not dysarthric.  CRANIAL NERVES:  Pupils equal round and reactive to light.  Normal conjugate, extra-ocular eye movements in all directions of gaze.  No ptosis.  Face is symmetric. Palate elevates symmetrically.  Tongue is midline.  MOTOR:  Motor strength is 5/5 in all extremities, except distally finger abductors 4/5, toe extensors and flexors 4/5, left finger extensors 4/5 (worse). No pronator drift.     MSRs:  Reflexes are 2+/4 in the upper extremities, and absent in the legs  SENSORY:  Sensory exam shows diffusely absent temperature throughout the entire arms and legs, vibration  is absent in the legs and trace in the hands bilaterally  COORDINATION/GAIT:  Normal finger-to- nose-finger.   Gait narrow based and stable, assisted with cane.   Data: NCS/EMG of the right side 12/14/2016:   1. 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  Overall, these findings are severe in degree electrically. 2. 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  Labs 12/11/2017:  Vitamin B12 438, vitamin B1 8, folate 8.5, copper 121, SPEP with IFE no M protein  NCS/EMG of the upper extremities 07/31/2017:  The electrophysiologic findings are most suggestive of a sensorimotor polyradiculoneuropathy affecting the upper extremities, moderate in degree electrically, which is new when compared to his previous study dated 12/31/2016. Alternatively, progression of sensorimotor demyelinating and axonal polyneuropathy with a superimposed ulnar neuropathy across the elbow (worse on the left), cannot be excluded. Correlate clinically.  MRI lumbar spine wo contrast 08/22/2017: 1. 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 The left foramen also appears mildly narrowed. 2. Mild left foraminal narrowing at L5-S1 without definite nerve root encroachment. 3. No other significant acquired disc space findings. Congenitally short pedicles.  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  He underwent NCS/EMG of the upper extremities in June 2019 which shows interval worsening of demyelinating and axonal neuropathy in the hands due to worsening diabetes.  Strongly discussed the importance of diabetes management and he now has an insulin pump.   Increase nortriptyline to 40mg  at bedtime (QTc 413 in 2018).  Recheck 12-lead EKG to assess QTc since I am increasing the dose  Continue Lyrica 150mg  three times daily Educated on daily feet inspection  Return to clinic in 6 months    Thank you for allowing me to participate in patient's care.  If I can answer any additional questions, I would be pleased to do so.    Sincerely,    Keyshawn Hellwig K. Posey Pronto, DO

## 2018-01-28 NOTE — Patient Instructions (Signed)
Increase nortriptyline to 40mg  at bedtime  Check EKG  Continue Lyrica 150mg  three times daily  Return to clinic in 6 months

## 2018-01-29 IMAGING — CT CT RENAL STONE PROTOCOL
2 of 4 series · 16 of 46 positions shown, 18 images · non-contrast
Comparison: CT 03/07/2016

CLINICAL DATA: Flank pain.  Right lower quadrant pain.

EXAM:
CT ABDOMEN AND PELVIS WITHOUT CONTRAST
TECHNIQUE: Multidetector CT imaging of the abdomen and pelvis was performed
following the standard protocol without IV contrast.

[Series 3: renal stone 5.0 · axial · 0.77mm/px · z∈[+996,+1456]mm · 13 of 102 slices shown, 15 images]
[im 5/102  soft-tissue]
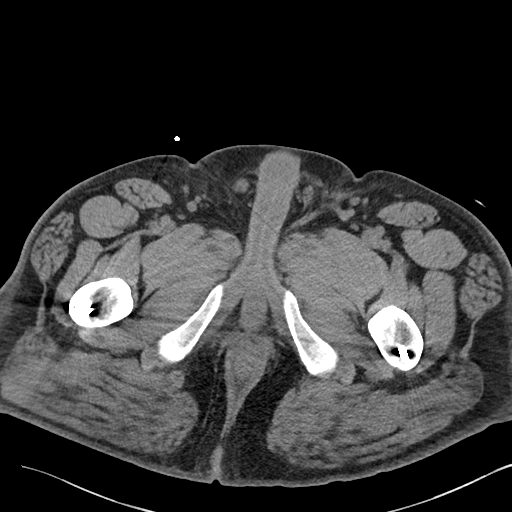
[im 5/102  bone]
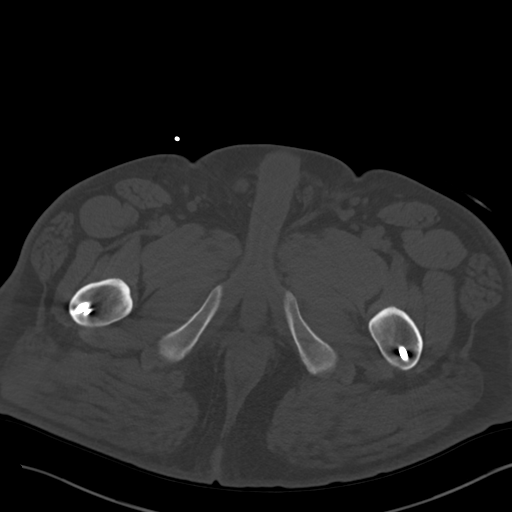
[im 13/102  soft-tissue]
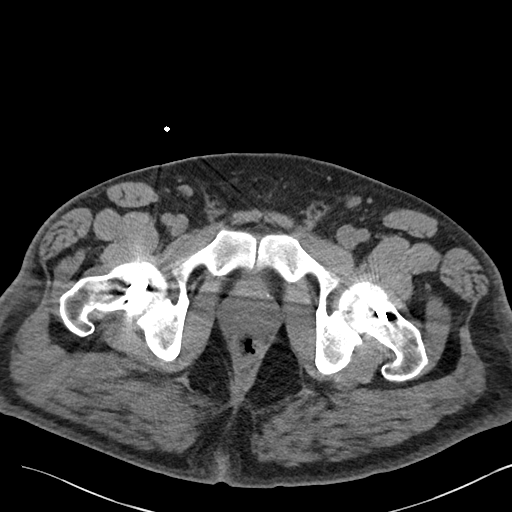
[im 21/102  soft-tissue]
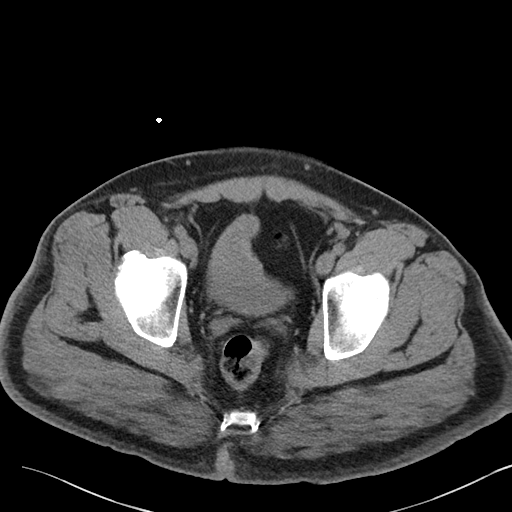
[im 29/102  soft-tissue]
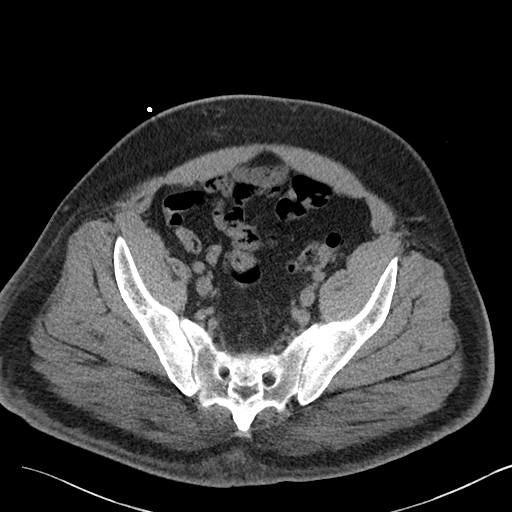
[im 37/102  soft-tissue]
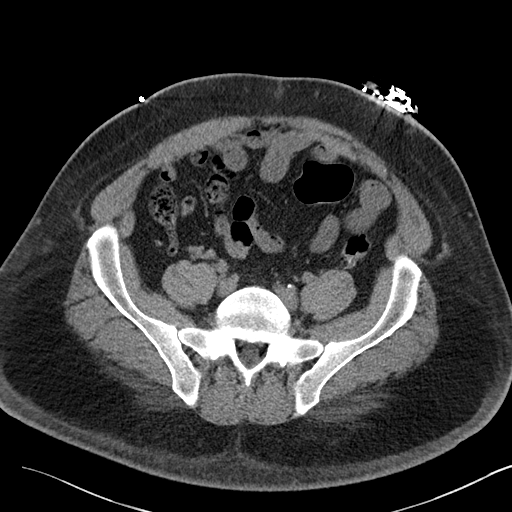
[im 45/102  soft-tissue]
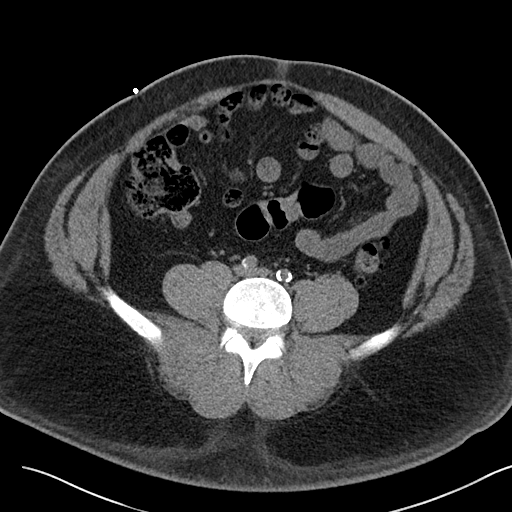
[im 53/102  soft-tissue]
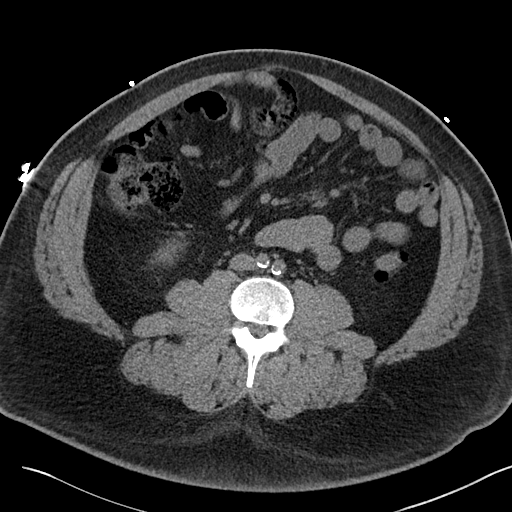
[im 57/102  soft-tissue]
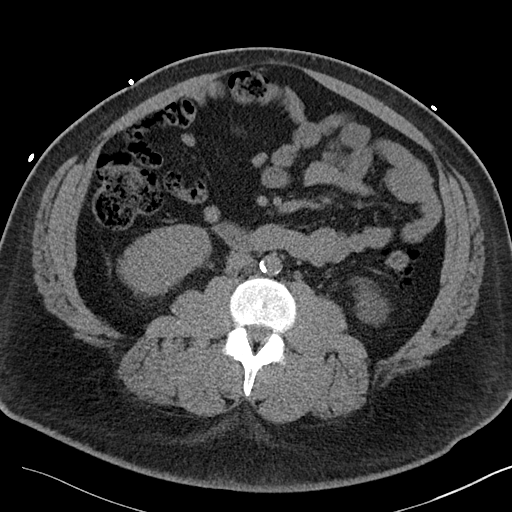
[im 65/102  soft-tissue]
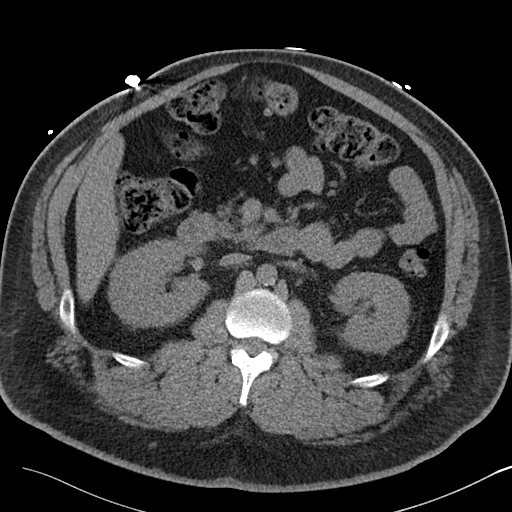
[im 65/102  bone]
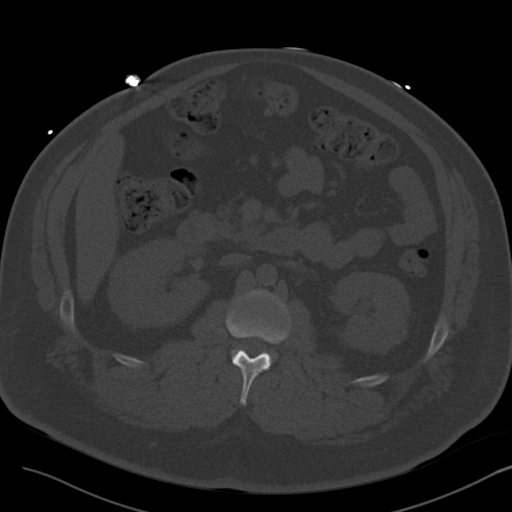
[im 73/102  soft-tissue]
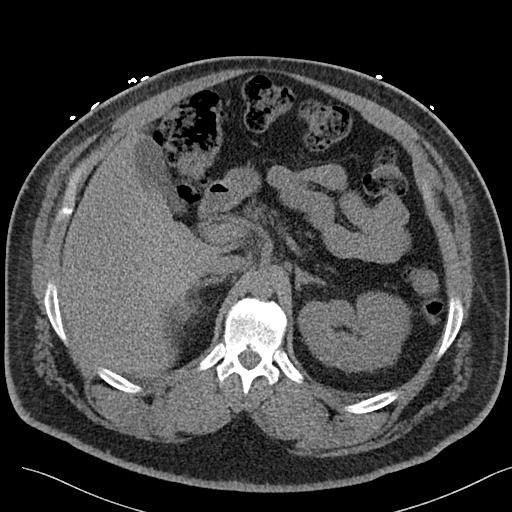
[im 81/102  soft-tissue]
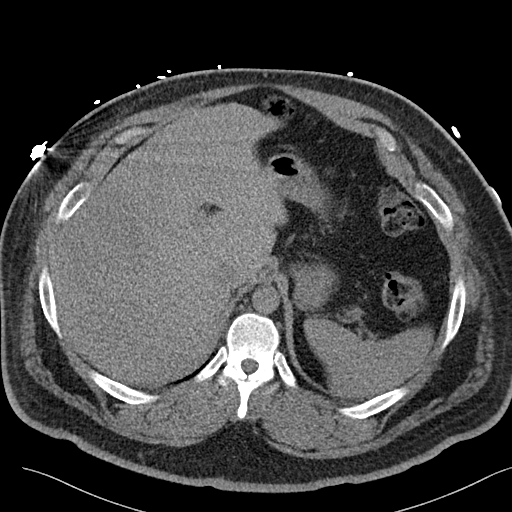
[im 89/102  soft-tissue]
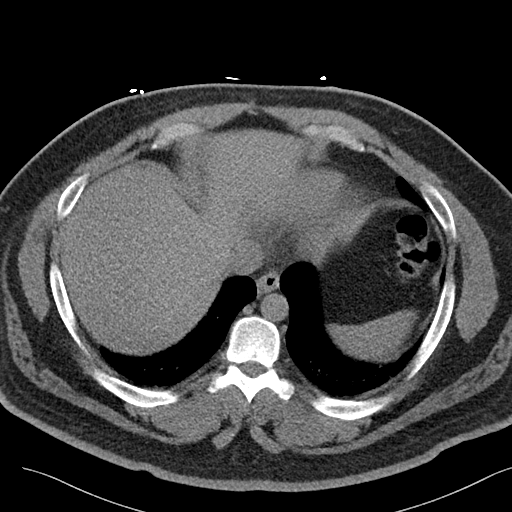
[im 97/102  soft-tissue]
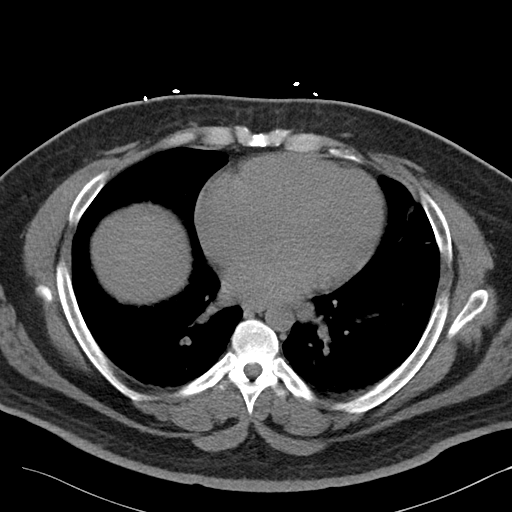

[Series 5: renal stone 3.0 cor · coronal · 0.91mm/px · 3 of 124 slices shown]
[im 42/124  soft-tissue]
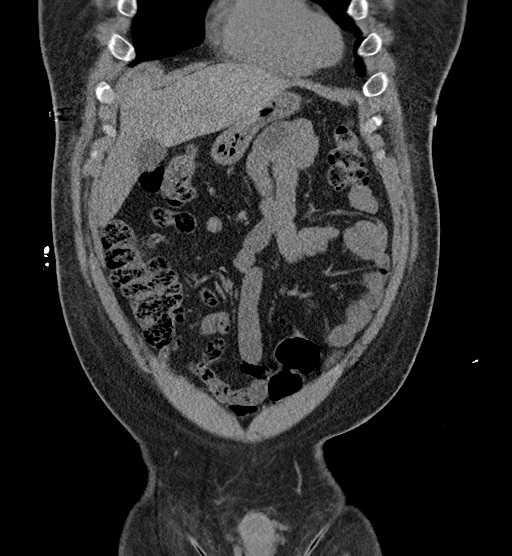
[im 55/124  soft-tissue]
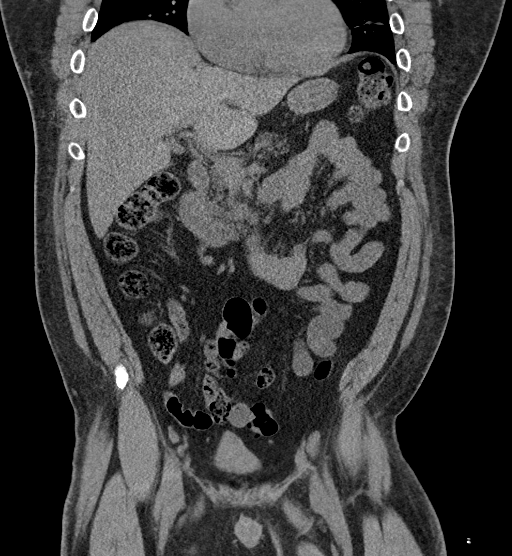
[im 69/124  soft-tissue]
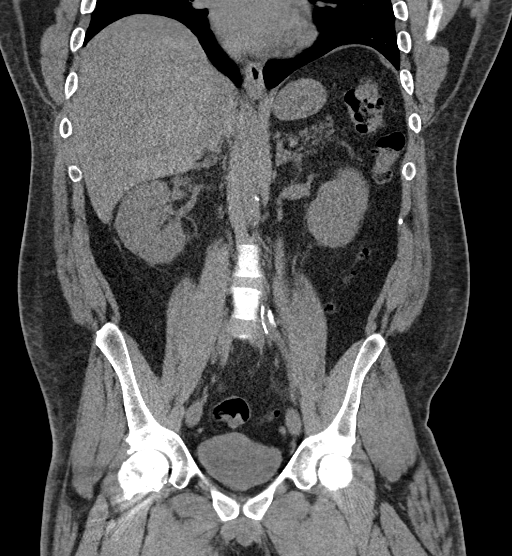

[16 of 46 positions shown; findings below may reference images not displayed]

FINDINGS: Lower chest: Lingular atelectasis. No pleural fluid or
consolidation.

Hepatobiliary: Prominent liver spanning 21 cm cranial caudal. No
evidence of focal lesion. Gallbladder physiologically distended, no
calcified stone. No biliary dilatation.

Pancreas: Fatty atrophy.  No ductal dilatation or inflammation.

Spleen: Normal in size without focal abnormality.

Adrenals/Urinary Tract: Normal adrenal glands.

No hydronephrosis. No urolithiasis. Mild nonspecific perinephric
edema. Simple cyst in the lower left kidney. Both ureters are
decompressed. Urinary bladder is minimally distended, no stone or
wall thickening.

Stomach/Bowel: The stomach is nondistended. No small bowel
inflammation, wall thickening or obstruction. Normal appendix.
Moderate colonic stool with tortuous sigmoid colon. Possible new
diverticulosis at the splenic flexure of the colon without
diverticulitis.

Vascular/Lymphatic: Age advanced aorta bi-iliac atherosclerosis. No
aneurysm. Small retroperitoneal lymph nodes. Prominent left greater
than right inguinal nodes are likely reactive.

Reproductive: Prostate is unremarkable.

Other: No free air, free fluid, or intra-abdominal fluid collection.

Musculoskeletal: There are no acute or suspicious osseous
abnormalities. Prior bilateral hip pinning.
IMPRESSION: 1. No renal stones or obstructive uropathy.
2. No acute abnormality or explanation for flank pain.
3. Age advanced aortic atherosclerosis ([id number].X).

## 2018-02-14 NOTE — Progress Notes (Signed)
Patient ID: Danny Fuller, male   DOB: 09-16-1975, 43 y.o.   MRN: [id number]            Reason for Appointment: Follow-up for Type 2 Diabetes and hypertension  Referring physician: Sarajane Jews   History of Present Illness:          Date of diagnosis of type 2 diabetes mellitus: 2007        Background history:   At diagnosis he was relatively asymptomatic and was started on metformin and glipizide He has been on the same oral hypoglycemic regimen for several years. Review of his A1c indicates it has been markedly increased persistently with the lowest reading in 2015 being 9.1  Recent history:   INSULIN regimen is:  OMNIPOD pump since 01/05/17  BASAL rate 5.5  Carbohydrate ratio 1:10, sensitivity 1:15 and target 120 Active insulin 3 hours  Non-insulin hypoglycemic drugs the patient is taking are: Metformin ER 2000 mg daily, Jardiance 25 mg daily, Victoza 1.2 mg daily  His A1c in November was 10.6   Current management, blood sugar patterns and problems identified:  He has been having better management of his pump using Novolog insulin but still has suspended his pump periodically especially overnight  He says that his insulin will run out randomly at different times including at night and he will not have his pump consistently on through the night on some nights  Also occasionally such as last night his blood sugar got low during the night  He was told to change his sensitivity to 1: 20 overnight but not clear why he has gone back to the same setting  His blood sugars the rest of the day are quite variable depending on how consistently he is seen using his pump or bolusing  BOLUSES are still inconsistent ranging from 1-3 boluses per day  No labs available to objectively assess his control  He is trying to take his Victoza more consistently since on the last visit he was advised not to leave it in his refrigerator all the time  Still has significant back pains and not  active  Has only occasional low readings during the day mostly related to increased physical activity at times and he does not know how to use temporary basal for his pump for increased activity  Taking Jardiance regularly        Side effects from medications have been: None  Compliance with the medical regimen: Fair  Glucose monitoring:  with freestyle meter on the Omnipod  Blood Glucose readings are being checked up to 4 times a day and blood sugars are being used to adjust insulin   PRE-MEAL Fasting Lunch Dinner Bedtime Overall  Glucose range:  55-244  64-301  142-326  147-346   Mean/median:  113  190  190  190  181     Self-care: Meal times are:  Breakfast is at 9-10 am, Lunch-1 PM : Dinner: 8 pm   Typical meal intake: Breakfast is Usually toast and boiled eggs.  Usually getting cold cut sandwiches at lunch, grilled chicken and vegetables/potatoes at dinner.  Snacks are usually chips, crackers                Dietician visit, most recent: 05/02/16               Exercise:  some walking, limited by pain  Weight history: His highest weight in the past has been 378    Wt Readings from Last 3 Encounters:  02/15/18 283 lb 9.6 oz (128.6 kg)  01/28/18 284 lb 2 oz (128.9 kg)  01/03/18 279 lb (126.6 kg)    Glycemic control:   Lab Results  Component Value Date   HGBA1C 10.6 (H) 12/27/2017   HGBA1C 12.3 (A) 09/17/2017   HGBA1C 9.4 (H) 05/31/2017   Lab Results  Component Value Date   MICROALBUR 60.9 (H) 05/31/2017   LDLCALC 51 03/23/2016   CREATININE 1.06 12/27/2017   Lab Results  Component Value Date   MICRALBCREAT 27.8 05/31/2017    Lab Results  Component Value Date   FRUCTOSAMINE 570 (H) 10/16/2017   FRUCTOSAMINE 380 (H) 07/12/2017   FRUCTOSAMINE 280 04/02/2017      Allergies as of 02/15/2018      Reactions   Vancomycin Other (See Comments)   AKI      Medication List       Accurate as of February 15, 2018 11:28 AM. Always use your most recent med list.         benazepril 40 MG tablet Commonly known as:  LOTENSIN Take 1 tablet (40 mg total) by mouth daily.   chlorthalidone 25 MG tablet Commonly known as:  HYGROTON take 1 tablet by mouth once daily   cloNIDine 0.1 mg/24hr patch Commonly known as:  CATAPRES - Dosed in mg/24 hr apply 1 patch every week   empagliflozin 25 MG Tabs tablet Commonly known as:  JARDIANCE Take 25 mg by mouth daily.   FIFTY50 PEN NEEDLES 31G X 5 MM Misc Generic drug:  Insulin Pen Needle Use to inject insulin once daily   FREESTYLE LIBRE SENSOR SYSTEM Misc APPLY TO UPPER ARM AND CHANGE SENSOR EVERY 10 DAYS   furosemide 40 MG tablet Commonly known as:  LASIX Take 1.5 tablets (60 mg total) by mouth daily.   insulin aspart 100 UNIT/ML injection Commonly known as:  NOVOLOG INJECT UNDER THE SKIN UPTO 170 UNITS VIA PUMP   [employer] TEST STRIPS test strip Generic drug:  glucose blood Use to test blood sugar 1 time daily   labetalol 100 MG tablet Commonly known as:  NORMODYNE Take 1 tablet (100 mg total) by mouth 2 (two) times daily. TAKE 1 TABLET BY MOUTH TWICE DAILY.   LYRICA 150 MG capsule Generic drug:  pregabalin Take 1 capsule (150 mg total) by mouth 3 (three) times daily.   nortriptyline 10 MG capsule Commonly known as:  PAMELOR TAKE 4 CAPSULES BY MOUTH AT BEDTIME   pravastatin 40 MG tablet Commonly known as:  PRAVACHOL Take 1 tablet (40 mg total) by mouth every evening.   SKIN TAC ADHESIVE BARRIER WIPE Misc 1 Product by Does not apply route 2 days for 90 doses. Used to prepare the skin before applying Omnipod pump   VICTOZA 18 MG/3ML Sopn Generic drug:  liraglutide Inject 0.2 mLs (1.2 mg total) into the skin daily. Inject once daily at the same time       Allergies:  Allergies  Allergen Reactions  . Vancomycin Other (See Comments)    AKI    Past Medical History:  Diagnosis Date  . Asthma   . Chronic kidney disease    sees Dr. Jamal Maes    . Diabetes mellitus    sees  Dr. Dwyane Dee   . GERD (gastroesophageal reflux disease)   . Headache(784.0)   . Hyperlipidemia   . Hypertension     Past Surgical History:  Procedure Laterality Date  . AMPUTATION Left 07/26/2015   Procedure: AMPUTATION LEFT FIFTH RAY;  Surgeon: [name], MD;  Location: [place];  Service: Orthopedics;  Laterality: Left;  . bilateral hip pins placed    . INCISION AND DRAINAGE PERIRECTAL ABSCESS Right 03/07/2016   Procedure: IRRIGATION AND DEBRIDEMENT PERIRECTAL ABSCESS;  Surgeon: Alphonsa Overall, MD;  Location: [facility];  Service: General;  Laterality: Right;  . INCISION AND DRAINAGE PERIRECTAL ABSCESS Right 03/09/2016   Procedure: EXAM UNDER ANESTHESIA, IRRIGATION AND DEBRIDEMENT PERIRECTAL ABSCESS;  Surgeon: Johnathan Hausen, MD;  Location: [facility];  Service: General;  Laterality: Right;  Open, betadine packed wound    Family History  Problem Relation Age of Onset  . Arthritis Other   . Diabetes Other   . Hypertension Other   . Hyperlipidemia Other   . Stroke Other   . Sudden death Other   . Diabetes Mother   . Diabetes Sister   . Diabetes Brother   . Heart attack Father        Died ate 67, couple of "heart attacks"     Social History:  reports that he quit smoking about 2 years ago. His smoking use included cigarettes. He has a 10.00 pack-year smoking history. He has never used smokeless tobacco. He reports that he does not drink alcohol or use drugs.   Review of Systems   Lipid history: LDL has been below 100, mild increase in triglycerides present Has been started on pravastatin by cardiologist    Lab Results  Component Value Date   CHOL 144 07/12/2017   HDL 28.90 (L) 07/12/2017   LDLCALC 51 03/23/2016   LDLDIRECT 89.0 07/12/2017   TRIG 201.0 (H) 07/12/2017   CHOLHDL 5 07/12/2017           Hypertension: On treatment for several years  Currently he is taking benazepril and chlorthalidone along with the 0.1 mg clonidine patch It was recommended bisoprolol but this was  denied by insurance and he was supposed to start labetalol but he did not pick this up Also his clonidine patch is on back order and has not taken it for 2 days  Blood pressure is consistently high   BP Readings from Last 3 Encounters:  02/15/18 (!) 160/90  01/28/18 (!) 170/90  01/03/18 (!) 160/90    EDEMA: He has history of significant leg edema He is taking Lasix 60 mg for leg edema, now controlled    Lab Results  Component Value Date   CREATININE 1.06 12/27/2017   BUN 20 12/27/2017   NA 132 (L) 12/27/2017   K 4.3 12/27/2017   CL 100 12/27/2017   CO2 25 12/27/2017    He has sharp pains in his feet from neuropathy,  on Lyrica without adequate control of his pain and numbness Followed by neurologist Also on nortriptyline at bedtime, now taking 40 mg and asking about simplifying his regimen He does not think duloxetine has helped before   He was recommended diabetic shoes by podiatrist   LABS:  No visits with results within 1 Week(s) from this visit.  Latest known visit with results is:  Lab on 12/27/2017  Component Date Value Ref Range Status  . Sodium 12/27/2017 132* 135 - 145 mEq/L Final  . Potassium 12/27/2017 4.3  3.5 - 5.1 mEq/L Final  . Chloride 12/27/2017 100  96 - 112 mEq/L Final  . CO2 12/27/2017 25  19 - 32 mEq/L Final  . Glucose, Bld 12/27/2017 373* 70 - 99 mg/dL Final  . BUN 12/27/2017 20  6 - 23 mg/dL Final  .  Creatinine, Ser 12/27/2017 1.06  0.40 - 1.50 mg/dL Final  . Calcium 12/27/2017 9.6  8.4 - 10.5 mg/dL Final  . GFR 12/27/2017 99.28  >60.00 mL/min Final  . Hgb A1c MFr Bld 12/27/2017 10.6* 4.6 - 6.5 % Final   Glycemic Control Guidelines for People with Diabetes:Non Diabetic:  <6%Goal of Therapy: <7%Additional Action Suggested:  >8%     Physical Examination:  BP (!) 160/90 (BP Location: Right Arm, Patient Position: Sitting, Cuff Size: Large)   Pulse 95   Ht 5\' 10"  (1.778 m)   Wt 283 lb 9.6 oz (128.6 kg)   SpO2 97%   BMI 40.69 kg/m      ASSESSMENT:  Diabetes type 2, uncontrolled, insulin-dependent   See history of present illness for detailed discussion of current diabetes management, blood sugar patterns and problems identified  His A1c was 10.6  His blood sugars are still quite labile and inconsistent Overall average 181 However not checking blood sugars enough lower consistently Probably also missing boluses Because of running out of insulin in his pump at random times he does not keep the pump on consistently including overnight Also has tendency to low blood sugars occasionally during the night either with correction doses are otherwise  As discussed above his basal rate is probably adequate for now but need more data to establish a pattern Also he can do better with exercise which he may be starting with water exercises Hypoglycemia is mostly related to overcorrection of high readings at night   HYPERTENSION: Blood pressure appears persistently high Discussed that he needs to pick up his labetalol prescription as directed Also if he is not able to get his clonidine patch he will start using the tablets, he will call if needed    Hyponatremia: This is mild, currently stable and from chlorthalidone, asymptomatic  PLAN:     More consistent monitoring  Check blood sugars 4 times a day consistently  To take all boluses as directed for all meals and snacks regardless of blood sugar reading  Sensitivity to be 1: 25 overnight  Basal rate to be 4.5 overnight until 7 AM, he will make sure he does this  To discuss use of temporary basal with nurse educator  Also look into the new PDM for pump  Increase Victoza up to 1.8 mg  Blood pressure medication to be adjusted as above He will continue discussing his neuropathy treatment with neurologist  Counseling time on subjects discussed in assessment and plan sections is over 50% of today's 25 minute visit   Patient Instructions  Victoza  1.8mg    [name] 02/15/2018, 11:28 AM   Note: This office note was prepared with Dragon voice recognition system technology. Any transcriptional errors that result from this process are unintentional.

## 2018-02-15 ENCOUNTER — Encounter: Payer: Self-pay | Admitting: Endocrinology

## 2018-02-15 ENCOUNTER — Ambulatory Visit (INDEPENDENT_AMBULATORY_CARE_PROVIDER_SITE_OTHER): Payer: Medicare Other | Admitting: Endocrinology

## 2018-02-15 VITALS — BP 160/90 | HR 95 | Ht 70.0 in | Wt 283.6 lb

## 2018-02-15 DIAGNOSIS — E1165 Type 2 diabetes mellitus with hyperglycemia: Secondary | ICD-10-CM | POA: Diagnosis not present

## 2018-02-15 DIAGNOSIS — Z794 Long term (current) use of insulin: Secondary | ICD-10-CM | POA: Diagnosis not present

## 2018-02-15 DIAGNOSIS — I10 Essential (primary) hypertension: Secondary | ICD-10-CM | POA: Diagnosis not present

## 2018-02-15 DIAGNOSIS — E1140 Type 2 diabetes mellitus with diabetic neuropathy, unspecified: Secondary | ICD-10-CM | POA: Diagnosis not present

## 2018-02-15 MED ORDER — LABETALOL HCL 100 MG PO TABS: 100.0000 mg | ORAL_TABLET | Freq: Two times a day (BID) | ORAL | 3 refills | Status: DC

## 2018-02-15 MED ORDER — VICTOZA 18 MG/3ML ~~LOC~~ SOPN: 1.8000 mg | PEN_INJECTOR | Freq: Every day | SUBCUTANEOUS | 3 refills | Status: DC

## 2018-02-15 NOTE — Patient Instructions (Signed)
Victoza 1.8 mg

## 2018-02-21 NOTE — Telephone Encounter (Signed)
Called [employer] to check on the status Of PA. They stated that PA needed to be sent on a different form.  Short acting opioid Form has been completed and faxed back.

## 2018-02-28 DIAGNOSIS — M545 Low back pain: Secondary | ICD-10-CM | POA: Diagnosis not present

## 2018-02-28 DIAGNOSIS — Z6836 Body mass index (BMI) 36.0-36.9, adult: Secondary | ICD-10-CM | POA: Diagnosis not present

## 2018-02-28 DIAGNOSIS — N182 Chronic kidney disease, stage 2 (mild): Secondary | ICD-10-CM | POA: Diagnosis not present

## 2018-02-28 DIAGNOSIS — I129 Hypertensive chronic kidney disease with stage 1 through stage 4 chronic kidney disease, or unspecified chronic kidney disease: Secondary | ICD-10-CM | POA: Diagnosis not present

## 2018-02-28 DIAGNOSIS — E1122 Type 2 diabetes mellitus with diabetic chronic kidney disease: Secondary | ICD-10-CM | POA: Diagnosis not present

## 2018-03-04 ENCOUNTER — Encounter: Payer: Self-pay | Admitting: Podiatrist

## 2018-03-05 NOTE — Telephone Encounter (Signed)
Called patient's insurance. PA has been approved.

## 2018-03-11 ENCOUNTER — Encounter: Payer: Self-pay | Admitting: Podiatry

## 2018-03-11 ENCOUNTER — Ambulatory Visit (INDEPENDENT_AMBULATORY_CARE_PROVIDER_SITE_OTHER): Payer: Medicare Other | Admitting: Podiatry

## 2018-03-11 DIAGNOSIS — L989 Disorder of the skin and subcutaneous tissue, unspecified: Secondary | ICD-10-CM

## 2018-03-11 DIAGNOSIS — L97522 Non-pressure chronic ulcer of other part of left foot with fat layer exposed: Secondary | ICD-10-CM

## 2018-03-11 DIAGNOSIS — I70245 Atherosclerosis of native arteries of left leg with ulceration of other part of foot: Secondary | ICD-10-CM

## 2018-03-12 ENCOUNTER — Other Ambulatory Visit: Payer: Self-pay

## 2018-03-12 MED ORDER — INSULIN ASPART 100 UNIT/ML ~~LOC~~ SOLN: SUBCUTANEOUS | 3 refills | Status: DC

## 2018-03-12 NOTE — Telephone Encounter (Signed)
Received a fax this morning, 03/12/2018, stating that the PA was denied. Appeal process has been started.

## 2018-03-17 NOTE — Progress Notes (Signed)
   Subjective:  43 year old male presenting today for follow up evaluation of an ulceration of the sub-first MPJ of the left foot and painful callus lesions of the feet bilaterally. He states he is doing well. Walking increases the pain of the callus lesions. He has not done anything at home for treatment. Patient is here for further evaluation and treatment.   Past Medical History:  Diagnosis Date  . Asthma   . Chronic kidney disease    sees Dr. Jamal Maes    . Diabetes mellitus    sees Dr. Dwyane Dee   . GERD (gastroesophageal reflux disease)   . Headache(784.0)   . Hyperlipidemia   . Hypertension       Objective/Physical Exam General: The patient is alert and oriented x3 in no acute distress.  Dermatology:  Wound #1 noted to the sub-first MPJ of the left foot measuring 0.2 x 0.3 x 0.2 cm (LxWxD).   To the noted ulceration(s), there is no eschar. There is a moderate amount of slough, fibrin, and necrotic tissue noted. Granulation tissue and wound base is red. There is a minimal amount of serosanguineous drainage noted. There is no exposed bone muscle-tendon ligament or joint. There is no malodor. Periwound integrity is intact. Hyperkeratotic lesions present on the bilateral feet x 2. Pain on palpation with a central nucleated core noted. Skin is warm, dry and supple bilateral lower extremities.  Vascular: Palpable pedal pulses bilaterally. No edema or erythema noted. Capillary refill within normal limits.  Neurological: Epicritic and protective threshold diminished bilaterally.   Musculoskeletal Exam: Range of motion within normal limits to all pedal and ankle joints bilateral. Muscle strength 5/5 in all groups bilateral.   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 #2 medically  necessary excisional debridement including subcutaneous tissue was performed using a tissue nipper and a chisel blade. Excisional debridement of all the necrotic nonviable tissue down to healthy bleeding viable tissue was performed with post-debridement measurements same as pre-. #3 the wound was cleansed and dry sterile dressing applied. #4 Recommended Betadine daily with a dry sterile dressing.  #5 Excisional debridement of keratotic lesion using a chisel blade was performed without incident. Light dressing applied.  #6 Appointment with Liliane Channel, Pedorthist, for DM shoes.  #7 patient is to return to clinic in 2 months for routine care.   Edrick Kins, DPM [facility]  Dr. Edrick Kins, [address]                                        [place],  [address]                Office [phone]  Fax [phone]

## 2018-03-29 NOTE — Progress Notes (Deleted)
Cardiology Office Note   Date:  03/29/2018   ID:  Danny Fuller, DOB 04/17/1975, MRN [id number]  PCP:  Danny Morale, MD  Cardiologist:   Minus Breeding, MD    No chief complaint on file.     History of Present Illness: Danny Fuller is a 43 y.o. male who was referred by Dr. Lorrene Reid for evaluation of palpitations.  He had a cardiac event monitor that demonstrated rare PVCs.  He had an echo with normal LV function although there was some moderate pulmonary HTN.  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   At the last visit he did have chest pain at a and had a negative POET (Plain Old Exercise Treadmill).   ***   The patient was just in the hospital and I reviewed these records for this visit.  He had dizziness.  He had an MRI and was eventually treated with meclizine.  This was in late December.  Prior to this he was in the emergency room with chest discomfort and a negative troponin and normal EKG.  He says he still gets some chest discomfort.  It is his left upper chest.  Seems to be somewhat with movement or lying down and then standing up.  It seems to be brief.  Is not describing substernal discomfort jaw or arm discomfort.  He is not having any new shortness of breath, PND or orthopnea.  Past Medical History:  Diagnosis Date  . Asthma   . Chronic kidney disease    sees Dr. Jamal Fuller    . Diabetes mellitus    sees Dr. Dwyane Fuller   . GERD (gastroesophageal reflux disease)   . Headache(784.0)   . Hyperlipidemia   . Hypertension     Past Surgical History:  Procedure Laterality Date  . AMPUTATION Left 07/26/2015   Procedure: AMPUTATION LEFT FIFTH RAY;  Surgeon: Danny Minion, MD;  Location: [place];  Service: Orthopedics;  Laterality: Left;  . bilateral hip pins placed    . INCISION AND DRAINAGE PERIRECTAL ABSCESS Right 03/07/2016   Procedure: IRRIGATION AND DEBRIDEMENT PERIRECTAL ABSCESS;  Surgeon:  Danny Overall, MD;  Location: [facility];  Service: General;  Laterality: Right;  . INCISION AND DRAINAGE PERIRECTAL ABSCESS Right 03/09/2016   Procedure: EXAM UNDER ANESTHESIA, IRRIGATION AND DEBRIDEMENT PERIRECTAL ABSCESS;  Surgeon: Danny Hausen, MD;  Location: [facility];  Service: General;  Laterality: Right;  Open, betadine packed wound     Current Outpatient Medications  Medication Sig Dispense Refill  . benazepril (LOTENSIN) 40 MG tablet Take 1 tablet (40 mg total) by mouth daily. 90 tablet 3  . chlorthalidone (HYGROTON) 25 MG tablet take 1 tablet by mouth once daily 30 tablet 1  . cloNIDine (CATAPRES - DOSED IN MG/24 HR) 0.1 mg/24hr patch apply 1 patch every week 4 patch 3  . Continuous Blood Gluc Sensor (FREESTYLE LIBRE SENSOR SYSTEM) MISC APPLY TO UPPER ARM AND CHANGE SENSOR EVERY 10 DAYS    . empagliflozin (JARDIANCE) 25 MG TABS tablet Take 25 mg by mouth daily. 30 tablet 3  . furosemide (LASIX) 40 MG tablet Take 1.5 tablets (60 mg total) by mouth daily. 45 tablet 11  . glucose blood ([employer] TEST STRIPS) test strip Use to test blood sugar 1 time daily    . insulin aspart (NOVOLOG) 100 UNIT/ML injection INJECT UNDER THE SKIN UPTO 170 UNITS VIA PUMP 50 mL 3  .  Insulin Pen Needle (FIFTY50 PEN NEEDLES) 31G X 5 MM MISC Use to inject insulin once daily    . labetalol (NORMODYNE) 100 MG tablet Take 1 tablet (100 mg total) by mouth 2 (two) times daily. TAKE 1 TABLET BY MOUTH TWICE DAILY. 60 tablet 3  . LYRICA 150 MG capsule Take 1 capsule (150 mg total) by mouth 3 (three) times daily. 90 capsule 5  . nortriptyline (PAMELOR) 10 MG capsule TAKE 4 CAPSULES BY MOUTH AT BEDTIME 360 capsule 1  . pravastatin (PRAVACHOL) 40 MG tablet Take 1 tablet (40 mg total) by mouth every evening. 90 tablet 3  . VICTOZA 18 MG/3ML SOPN Inject 0.3 mLs (1.8 mg total) into the skin daily. Inject once daily at the same time 3 pen 3   Current Facility-Administered Medications  Medication Dose Route Frequency Provider Last  Rate Last Dose  . bupivacaine (MARCAINE) 0.5 % (with pres) injection 3 mL  3 mL Other Once Danny Sinning, MD        Allergies:   Vancomycin   ROS:  Please see the history of present illness.   Otherwise, review of systems are positive for ***.   All other systems are reviewed and negative.    PHYSICAL EXAM: VS:  There were no vitals taken for this visit. , BMI There is no height or weight on file to calculate BMI.  GENERAL:  Well appearing NECK:  No jugular venous distention, waveform within normal limits, carotid upstroke brisk and symmetric, no bruits, no thyromegaly LUNGS:  Clear to auscultation bilaterally CHEST:  Unremarkable HEART:  PMI not displaced or sustained,S1 and S2 within normal limits, no S3, no S4, no clicks, no rubs, *** murmurs ABD:  Flat, positive bowel sounds normal in frequency in pitch, no bruits, no rebound, no guarding, no midline pulsatile mass, no hepatomegaly, no splenomegaly EXT:  2 plus pulses throughout, no edema, no cyanosis no clubbing    ***GENERAL:  Well appearing NECK:  No jugular venous distention, waveform within normal limits, carotid upstroke brisk and symmetric, no bruits, no thyromegaly LUNGS:  Clear to auscultation bilaterally CHEST:  Unremarkable HEART:  PMI not displaced or sustained,S1 and S2 within normal limits, no S3, no S4, no clicks, no rubs, *** murmurs ABD:  Flat, positive bowel sounds normal in frequency in pitch, no bruits, no rebound, no guarding, no midline pulsatile mass, no hepatomegaly, no splenomegaly EXT:  2 plus pulses throughout, no edema, no cyanosis no clubbing    ***GENERAL:  Well appearing HEENT:  Poor dentition.   NECK:  No jugular venous distention, waveform within normal limits, carotid upstroke brisk and symmetric, no bruits, no thyromegaly LUNGS:  Clear to auscultation bilaterally CHEST:  Unremarkable HEART:  PMI not displaced or sustained,S1 and S2 within normal limits, no S3, no S4, no clicks, no rubs, no  murmurs ABD:  Flat, positive bowel sounds normal in frequency in pitch, no bruits, no rebound, no guarding, no midline pulsatile mass, no hepatomegaly, no splenomegaly EXT:  2 plus pulses throughout, diffuse mild leg edema, no cyanosis no clubbing, insulin pump.   EKG:  EKG is *** ordered today.   Recent Labs: 09/17/2017: ALT 20 12/27/2017: BUN 20; Creatinine, Ser 1.06; Potassium 4.3; Sodium 132    Lipid Panel    Component Value Date/Time   CHOL 144 07/12/2017 0944   TRIG 201.0 (H) 07/12/2017 0944   HDL 28.90 (L) 07/12/2017 0944   CHOLHDL 5 07/12/2017 0944   VLDL 40.2 (H) 07/12/2017 0944   LDLCALC  51 03/23/2016 0947   LDLDIRECT 89.0 07/12/2017 0944      Wt Readings from Last 3 Encounters:  02/15/18 283 lb 9.6 oz (128.6 kg)  01/28/18 284 lb 2 oz (128.9 kg)  01/03/18 279 lb (126.6 kg)      Other studies Reviewed: Additional studies/ records that were reviewed today include:  *** Review of the above records demonstrates:  ***   ASSESSMENT AND PLAN:   PALPITATIONS:   *** He has had no significant dysrhythmias.  No further workup is planned.   CHEST PAIN:    Since his negative POET (Plain Old Exercise Treadmill) ***  This is somewhat atypical but he has significant cardiovascular risk factors and can bring him back for a screening POET (Plain Old Exercise Treadmill).  Of note this will be on medications.    HTN:    The blood pressure is ***  better controlled and he will continue the meds as listed.  DM: His blood sugar is ***down the hemoglobin went from 10.8-8.5.  DYSLIPIDEMIA:    ***   will check a fasting lipid profile.  Goal will be an LDL less than 70.    Current medicines are reviewed at length with the patient today.  The patient does not have concerns regarding medicines.  The following changes have been made:  ***  Labs/ tests ordered today include: ***  No orders of the defined types were placed in this encounter.    Disposition:   FU with me in ***  months.     Signed, Minus Breeding, MD  03/29/2018 9:33 PM    [place]

## 2018-04-01 ENCOUNTER — Ambulatory Visit: Payer: Medicare Other | Admitting: Cardiology

## 2018-04-01 ENCOUNTER — Encounter: Payer: Self-pay | Admitting: Cardiology

## 2018-04-01 ENCOUNTER — Ambulatory Visit (INDEPENDENT_AMBULATORY_CARE_PROVIDER_SITE_OTHER): Payer: Medicare Other | Admitting: Cardiology

## 2018-04-01 VITALS — BP 178/106 | HR 89 | Ht 70.0 in | Wt 271.8 lb

## 2018-04-01 DIAGNOSIS — E785 Hyperlipidemia, unspecified: Secondary | ICD-10-CM | POA: Insufficient documentation

## 2018-04-01 DIAGNOSIS — I70245 Atherosclerosis of native arteries of left leg with ulceration of other part of foot: Secondary | ICD-10-CM | POA: Diagnosis not present

## 2018-04-01 DIAGNOSIS — I272 Pulmonary hypertension, unspecified: Secondary | ICD-10-CM | POA: Diagnosis not present

## 2018-04-01 DIAGNOSIS — I10 Essential (primary) hypertension: Secondary | ICD-10-CM

## 2018-04-01 DIAGNOSIS — R002 Palpitations: Secondary | ICD-10-CM

## 2018-04-01 NOTE — Patient Instructions (Addendum)
Medication Instructions:  Continue current medications  If you need a refill on your cardiac medications before your next appointment, please call your pharmacy.  Labwork: None Ordered   Take the provided lab slips with you to the lab for your blood draw.   When you have your labs (blood work) drawn today and your tests are completely normal, you will receive your results only by MyChart Message (if you have MyChart) -OR-  A paper copy in the mail.  If you have any lab test that is abnormal or we need to change your treatment, we will call you to review these results.  Testing/Procedures: Your physician has requested that you have an echocardiogram. Echocardiography is a painless test that uses sound waves to create images of your heart. It provides your doctor with information about the size and shape of your heart and how well your heart's chambers and valves are working. This procedure takes approximately one hour. There are no restrictions for this procedure.  Follow-Up: . Your physician recommends that you schedule a follow-up appointment in: Erasmo Downer or [place] for medications management Your physician recommends that you schedule a follow-up appointment in: As Needed   At [facility], you and your health needs are our priority.  As part of our continuing mission to provide you with exceptional heart care, we have created designated Provider Care Teams.  These Care Teams include your primary Cardiologist (physician) and Advanced Practice Providers (APPs -  Physician Assistants and Nurse Practitioners) who all work together to provide you with the care you need, when you need it.  Thank you for choosing [facility] at [facility]!!

## 2018-04-01 NOTE — Progress Notes (Signed)
Cardiology Office Note   Date:  04/01/2018   ID:  Danny Fuller, DOB 09/25/1975, MRN [id number]  PCP:  [name], MD  Cardiologist:   Minus Breeding, MD    Chief Complaint  Patient presents with  . Palpitations      History of Present Illness: Danny Fuller is a 43 y.o. male who was referred by Dr. Lorrene Reid for evaluation of palpitations.  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 After the last visit he had a cardiac event monitor that demonstrated rare PVCs.  He had an echo with normal LV function although there was some moderate pulmonary HTN.   He returns for one year follow up.  He is trying to do much better since I saw him.  He has been eating better.  He has been exercising.  He says his A1c came down from about 13 to 10.6.  He denies any chest pressure, neck or arm discomfort.  He has had no palpitations, presyncope or syncope.  His biggest issue has been numbness discomfort in his left foot where he had his left fifth toe amputated.   Past Medical History:  Diagnosis Date  . Asthma   . Chronic kidney disease    sees Dr. Jamal Maes    . Diabetes mellitus    sees Dr. Dwyane Dee   . GERD (gastroesophageal reflux disease)   . Headache(784.0)   . Hyperlipidemia   . Hypertension   . Sleep apnea    CPAP    Past Surgical History:  Procedure Laterality Date  . AMPUTATION Left 07/26/2015   Procedure: AMPUTATION LEFT FIFTH RAY;  Surgeon: [name], MD;  Location: [place];  Service: Orthopedics;  Laterality: Left;  . bilateral hip pins placed    . INCISION AND DRAINAGE PERIRECTAL ABSCESS Right 03/07/2016   Procedure: IRRIGATION AND DEBRIDEMENT PERIRECTAL ABSCESS;  Surgeon: Alphonsa Overall, MD;  Location: [facility];  Service: General;  Laterality: Right;  . INCISION AND DRAINAGE PERIRECTAL ABSCESS Right 03/09/2016   Procedure: EXAM UNDER ANESTHESIA, IRRIGATION AND  DEBRIDEMENT PERIRECTAL ABSCESS;  Surgeon: Johnathan Hausen, MD;  Location: [facility];  Service: General;  Laterality: Right;  Open, betadine packed wound     Current Outpatient Medications  Medication Sig Dispense Refill  . benazepril (LOTENSIN) 40 MG tablet Take 1 tablet (40 mg total) by mouth daily. 90 tablet 3  . chlorthalidone (HYGROTON) 25 MG tablet take 1 tablet by mouth once daily 30 tablet 1  . cloNIDine (CATAPRES - DOSED IN MG/24 HR) 0.1 mg/24hr patch apply 1 patch every week 4 patch 3  . Continuous Blood Gluc Sensor (FREESTYLE LIBRE SENSOR SYSTEM) MISC APPLY TO UPPER ARM AND CHANGE SENSOR EVERY 10 DAYS    . empagliflozin (JARDIANCE) 25 MG TABS tablet Take 25 mg by mouth daily. 30 tablet 3  . furosemide (LASIX) 40 MG tablet Take 1.5 tablets (60 mg total) by mouth daily. 45 tablet 11  . glucose blood ([employer] TEST STRIPS) test strip Use to test blood sugar 1 time daily    . insulin aspart (NOVOLOG) 100 UNIT/ML injection INJECT UNDER THE SKIN UPTO 170 UNITS VIA PUMP 50 mL 3  . Insulin Pen Needle (FIFTY50 PEN NEEDLES) 31G X 5 MM MISC Use to inject insulin once daily    . labetalol (NORMODYNE) 100 MG tablet Take 1 tablet (100 mg total) by mouth 2 (  two) times daily. TAKE 1 TABLET BY MOUTH TWICE DAILY. 60 tablet 3  . LYRICA 150 MG capsule Take 1 capsule (150 mg total) by mouth 3 (three) times daily. 90 capsule 5  . nortriptyline (PAMELOR) 10 MG capsule TAKE 4 CAPSULES BY MOUTH AT BEDTIME 360 capsule 1  . VICTOZA 18 MG/3ML SOPN Inject 0.3 mLs (1.8 mg total) into the skin daily. Inject once daily at the same time 3 pen 3  . pravastatin (PRAVACHOL) 40 MG tablet Take 1 tablet (40 mg total) by mouth every evening. 90 tablet 3   Current Facility-Administered Medications  Medication Dose Route Frequency Provider Last Rate Last Dose  . bupivacaine (MARCAINE) 0.5 % (with pres) injection 3 mL  3 mL Other Once Magnus Sinning, MD        Allergies:   Vancomycin   ROS:  Please see the history of present  illness.   Otherwise, review of systems are positive for none.   All other systems are reviewed and negative.    PHYSICAL EXAM: VS:  BP (!) 178/106   Pulse 89   Ht 5\' 10"  (1.778 m)   Wt 271 lb 12.8 oz (123.3 kg)   BMI 39.00 kg/m  , BMI Body mass index is 39 kg/m.  GENERAL:  Well appearing NECK:  No jugular venous distention, waveform within normal limits, carotid upstroke brisk and symmetric, no bruits, no thyromegaly LUNGS:  Clear to auscultation bilaterally CHEST:  Unremarkable HEART:  PMI not displaced or sustained,S1 and S2 within normal limits, no S3, no S4, no clicks, no rubs, no murmurs ABD:  Flat, positive bowel sounds normal in frequency in pitch, no bruits, no rebound, no guarding, no midline pulsatile mass, no hepatomegaly, no splenomegaly EXT:  2 plus pulses throughout, no edema, no cyanosis no clubbing.  Absent left fifth toe   EKG:  EKG is  ordered today. Sinus rhythm, rate 89, axis within normal limits, intervals within normal limits, no acute ST-T wave changes.  Recent Labs: 09/17/2017: ALT 20 12/27/2017: BUN 20; Creatinine, Ser 1.06; Potassium 4.3; Sodium 132    Lipid Panel    Component Value Date/Time   CHOL 144 07/12/2017 0944   TRIG 201.0 (H) 07/12/2017 0944   HDL 28.90 (L) 07/12/2017 0944   CHOLHDL 5 07/12/2017 0944   VLDL 40.2 (H) 07/12/2017 0944   LDLCALC 51 03/23/2016 0947   LDLDIRECT 89.0 07/12/2017 0944      Wt Readings from Last 3 Encounters:  04/01/18 271 lb 12.8 oz (123.3 kg)  02/15/18 283 lb 9.6 oz (128.6 kg)  01/28/18 284 lb 2 oz (128.9 kg)      Other studies Reviewed: Additional studies/ records that were reviewed today include:  Labs Review of the above records demonstrates:     ASSESSMENT AND PLAN:   PALPITATIONS:    He has had no further dysrhythmias no palpitations.  No change in therapy is indicated.  CHEST PAIN:    He had a negative treadmill test last year.  No further chest discomfort.  No further evaluation is  indicated.  HTN:   The blood pressure is elevated.  However, he has had recent adjustments to his medications.  He thinks this includes a higher dose Catapres patch which he is not received yet in the mail.  He is taken some p.o. clonidine that he had.  Because he is having frequent and careful adjustments of his medications by his other physicians I will not make any changes.   DM: His blood  sugar is coming down.  However, he certainly not at target.  We had a long discussion about diet and exercise and I applaud his current efforts.   DYSLIPIDEMIA:   His last LDL was 89 with an HDL of 28.  He should have this repeated in May and I will defer to [name], MD   MEDICATION MANAGEMENT: The patient seems to be doing a good job and lifestyle changes.  However, he is frustrated because he has difficulty sometimes getting his prescriptions renewed or into the pharmacy from the various prescribing offices.  I am going to bring him back to see our pharmacist to have a medication review appointment only to help with this problem so that he does not have frustration and lose ground on his excellent progress.  PULMONARY HTN: He had this noted as moderate hypertension on a previous echo.  I will follow-up.  If there is no further pulmonary hypertension no change in therapy or further imaging will be needed.    Current medicines are reviewed at length with the patient today.  The patient does not have concerns regarding medicines.  The following changes have been made:  None  Labs/ tests ordered today include: None  Orders Placed This Encounter  Procedures  . EKG 12-Lead  . ECHOCARDIOGRAM COMPLETE     Disposition:   FU with me PRN based on the results above.      Signed, Minus Breeding, MD  04/01/2018 12:45 PM    [place]

## 2018-04-02 ENCOUNTER — Encounter: Payer: Medicare Other | Attending: Endocrinology | Admitting: Nutrition

## 2018-04-02 ENCOUNTER — Telehealth: Payer: Self-pay | Admitting: Nutrition

## 2018-04-02 ENCOUNTER — Other Ambulatory Visit: Payer: Self-pay

## 2018-04-02 DIAGNOSIS — E1165 Type 2 diabetes mellitus with hyperglycemia: Secondary | ICD-10-CM | POA: Insufficient documentation

## 2018-04-02 MED ORDER — OMNIPOD CLASSIC PODS (GEN 3) MISC: 1.0000 | 3 refills | Status: DC

## 2018-04-02 NOTE — Telephone Encounter (Signed)
If he is not able to get the OmniPod he can use Lantus 60 units twice a day in the meantime along with NovoLog

## 2018-04-02 NOTE — Telephone Encounter (Signed)
Patient came in saying that he went on Medicare, and the distributer he has for his OmniPods does not do medicare.  He has run out of Pods and has been giving Novolog injections via pen, but blood sugars are "still over 300.  Prescription sent to [facility] in [place]

## 2018-04-03 ENCOUNTER — Emergency Department (HOSPITAL_COMMUNITY): Payer: Medicare Other

## 2018-04-03 ENCOUNTER — Other Ambulatory Visit: Payer: Self-pay

## 2018-04-03 ENCOUNTER — Telehealth: Payer: Self-pay | Admitting: Nutrition

## 2018-04-03 ENCOUNTER — Observation Stay (HOSPITAL_COMMUNITY)
Admission: EM | Admit: 2018-04-03 | Discharge: 2018-04-06 | Disposition: A | Discharge status: Home / Self Care | Payer: Medicare Other | Attending: Internal Medicine | Admitting: Internal Medicine

## 2018-04-03 DIAGNOSIS — F329 Major depressive disorder, single episode, unspecified: Secondary | ICD-10-CM | POA: Insufficient documentation

## 2018-04-03 DIAGNOSIS — N179 Acute kidney failure, unspecified: Secondary | ICD-10-CM | POA: Insufficient documentation

## 2018-04-03 DIAGNOSIS — G4733 Obstructive sleep apnea (adult) (pediatric): Secondary | ICD-10-CM | POA: Insufficient documentation

## 2018-04-03 DIAGNOSIS — Z79899 Other long term (current) drug therapy: Secondary | ICD-10-CM | POA: Diagnosis not present

## 2018-04-03 DIAGNOSIS — Z794 Long term (current) use of insulin: Secondary | ICD-10-CM | POA: Diagnosis not present

## 2018-04-03 DIAGNOSIS — D72829 Elevated white blood cell count, unspecified: Secondary | ICD-10-CM | POA: Diagnosis not present

## 2018-04-03 DIAGNOSIS — I129 Hypertensive chronic kidney disease with stage 1 through stage 4 chronic kidney disease, or unspecified chronic kidney disease: Secondary | ICD-10-CM | POA: Diagnosis not present

## 2018-04-03 DIAGNOSIS — N529 Male erectile dysfunction, unspecified: Secondary | ICD-10-CM | POA: Insufficient documentation

## 2018-04-03 DIAGNOSIS — J45909 Unspecified asthma, uncomplicated: Secondary | ICD-10-CM | POA: Diagnosis not present

## 2018-04-03 DIAGNOSIS — Z8249 Family history of ischemic heart disease and other diseases of the circulatory system: Secondary | ICD-10-CM | POA: Insufficient documentation

## 2018-04-03 DIAGNOSIS — E785 Hyperlipidemia, unspecified: Secondary | ICD-10-CM | POA: Diagnosis not present

## 2018-04-03 DIAGNOSIS — E669 Obesity, unspecified: Secondary | ICD-10-CM | POA: Diagnosis not present

## 2018-04-03 DIAGNOSIS — E1142 Type 2 diabetes mellitus with diabetic polyneuropathy: Secondary | ICD-10-CM | POA: Insufficient documentation

## 2018-04-03 DIAGNOSIS — E1165 Type 2 diabetes mellitus with hyperglycemia: Principal | ICD-10-CM | POA: Insufficient documentation

## 2018-04-03 DIAGNOSIS — E861 Hypovolemia: Secondary | ICD-10-CM | POA: Diagnosis not present

## 2018-04-03 DIAGNOSIS — R000 Tachycardia, unspecified: Secondary | ICD-10-CM | POA: Diagnosis not present

## 2018-04-03 DIAGNOSIS — Z6839 Body mass index (BMI) 39.0-39.9, adult: Secondary | ICD-10-CM | POA: Insufficient documentation

## 2018-04-03 DIAGNOSIS — E875 Hyperkalemia: Secondary | ICD-10-CM | POA: Diagnosis not present

## 2018-04-03 DIAGNOSIS — R200 Anesthesia of skin: Secondary | ICD-10-CM | POA: Diagnosis not present

## 2018-04-03 DIAGNOSIS — N182 Chronic kidney disease, stage 2 (mild): Secondary | ICD-10-CM | POA: Insufficient documentation

## 2018-04-03 DIAGNOSIS — E1122 Type 2 diabetes mellitus with diabetic chronic kidney disease: Secondary | ICD-10-CM | POA: Insufficient documentation

## 2018-04-03 DIAGNOSIS — I10 Essential (primary) hypertension: Secondary | ICD-10-CM | POA: Diagnosis present

## 2018-04-03 DIAGNOSIS — I959 Hypotension, unspecified: Secondary | ICD-10-CM | POA: Diagnosis not present

## 2018-04-03 DIAGNOSIS — K219 Gastro-esophageal reflux disease without esophagitis: Secondary | ICD-10-CM | POA: Diagnosis not present

## 2018-04-03 DIAGNOSIS — R739 Hyperglycemia, unspecified: Secondary | ICD-10-CM

## 2018-04-03 DIAGNOSIS — Z87891 Personal history of nicotine dependence: Secondary | ICD-10-CM | POA: Insufficient documentation

## 2018-04-03 DIAGNOSIS — E1140 Type 2 diabetes mellitus with diabetic neuropathy, unspecified: Secondary | ICD-10-CM

## 2018-04-03 LAB — I-STAT TROPONIN, ED: Troponin i, poc: 0.07 ng/mL (ref 0.00–0.08)

## 2018-04-03 LAB — POCT I-STAT EG7
Acid-Base Excess: 1 mmol/L (ref 0.0–2.0)
Bicarbonate: 27.4 mmol/L (ref 20.0–28.0)
Calcium, Ion: 1.22 mmol/L (ref 1.15–1.40)
HCT: 43 % (ref 39.0–52.0)
HEMOGLOBIN: 14.6 g/dL (ref 13.0–17.0)
O2 Saturation: 68 %
PO2 VEN: 38 mmHg (ref 32.0–45.0)
Potassium: 3.9 mmol/L (ref 3.5–5.1)
Sodium: 130 mmol/L — ABNORMAL LOW (ref 135–145)
TCO2: 29 mmol/L (ref 22–32)
pCO2, Ven: 50.9 mmHg (ref 44.0–60.0)
pH, Ven: 7.338 (ref 7.250–7.430)

## 2018-04-03 LAB — URINALYSIS, ROUTINE W REFLEX MICROSCOPIC
Bacteria, UA: NONE SEEN
Bilirubin Urine: NEGATIVE
Glucose, UA: >=500 mg/dL — AB
HGB URINE DIPSTICK: NEGATIVE
Ketones, ur: NEGATIVE mg/dL
LEUKOCYTE UA: NEGATIVE
Nitrite: NEGATIVE
PH: 5 (ref 5.0–8.0)
Protein, ur: NEGATIVE mg/dL
Specific Gravity, Urine: 1.015 (ref 1.005–1.030)

## 2018-04-03 LAB — BASIC METABOLIC PANEL
ANION GAP: 13 (ref 5–15)
BUN: 30 mg/dL — ABNORMAL HIGH (ref 6–20)
CO2: 21 mmol/L — ABNORMAL LOW (ref 22–32)
Calcium: 9.3 mg/dL (ref 8.9–10.3)
Chloride: 96 mmol/L — ABNORMAL LOW (ref 98–111)
Creatinine, Ser: 1.98 mg/dL — ABNORMAL HIGH (ref 0.61–1.24)
GFR calc Af Amer: 48 mL/min — ABNORMAL LOW (ref >60)
GFR calc non Af Amer: 41 mL/min — ABNORMAL LOW (ref >60)
Glucose, Bld: 401 mg/dL — ABNORMAL HIGH (ref 70–99)
POTASSIUM: 3.7 mmol/L (ref 3.5–5.1)
Sodium: 130 mmol/L — ABNORMAL LOW (ref 135–145)

## 2018-04-03 LAB — CBC
HCT: 45 % (ref 39.0–52.0)
Hemoglobin: 15.4 g/dL (ref 13.0–17.0)
MCH: 29.4 pg (ref 26.0–34.0)
MCHC: 34.2 g/dL (ref 30.0–36.0)
MCV: 86 fL (ref 80.0–100.0)
Platelets: 336 10*3/uL (ref 150–400)
RBC: 5.23 MIL/uL (ref 4.22–5.81)
RDW: 12.6 % (ref 11.5–15.5)
WBC: 11.8 10*3/uL — ABNORMAL HIGH (ref 4.0–10.5)
nRBC: 0 % (ref 0.0–0.2)

## 2018-04-03 LAB — CBG MONITORING, ED: Glucose-Capillary: 395 mg/dL — ABNORMAL HIGH (ref 70–99)

## 2018-04-03 MED ORDER — ACETAMINOPHEN 500 MG PO TABS
1000.0000 mg | ORAL_TABLET | Freq: Once | ORAL | Status: AC
  Administered 2018-04-03: 1000 mg via ORAL
  Filled 2018-04-03: qty 2

## 2018-04-03 MED ORDER — OMNIPOD DASH PDM (GEN 4) KIT: 1.0000 | PACK | 0 refills | Status: DC

## 2018-04-03 MED ORDER — MECLIZINE HCL 25 MG PO TABS
50.0000 mg | ORAL_TABLET | Freq: Once | ORAL | Status: AC
  Administered 2018-04-04: 50 mg via ORAL
  Filled 2018-04-03: qty 2

## 2018-04-03 MED ORDER — SODIUM CHLORIDE 0.9 % IV BOLUS
1000.0000 mL | Freq: Once | INTRAVENOUS | Status: AC
  Administered 2018-04-03: 1000 mL via INTRAVENOUS

## 2018-04-03 MED ORDER — OMNIPOD DASH PODS (GEN 4) MISC: 1.0000 | 3 refills | Status: DC

## 2018-04-03 MED ORDER — SODIUM CHLORIDE 0.9% FLUSH
3.0000 mL | Freq: Once | INTRAVENOUS | Status: AC
  Administered 2018-04-04: 3 mL via INTRAVENOUS

## 2018-04-03 NOTE — ED Notes (Signed)
Pt still waiting  On urine.  Iv just started

## 2018-04-03 NOTE — ED Notes (Addendum)
The pt is c/o low bp earlier today after he took his high bp med.  Pt c/o dizziness and numbness all over his body since 1100am  Alert oriented skin warm and dry no distress

## 2018-04-03 NOTE — ED Triage Notes (Addendum)
Patient states that his blood pressure has been running too low. Wears a Clonidine patch (this was removed in Triage), takes labetalol, another BP med, and Benazpril PO. States the lowest his pressure was 64/45 and then 74/43.

## 2018-04-03 NOTE — Progress Notes (Signed)
Danny Fuller walked into the office and said that he has not been able to get his OmniPods from his distributor, because he is now Medicare.  He has been taking Novolog insulin ac meals , and blood sugars have beeen running in the 300s.  Dr. Dwyane Dee notified and patient told to start on lantus 60u twice daily, 12 hours appart and Novolog before all meals. He asked me to test his blood pressure, saying it was high.  I tested it, and it was 170/100.  He was told to call his family doctor ASAP to inform him of his BP.  Pt. Reported that he had an appointment with him tomorrow for this.  He had called yesterday because his home readings were getting high.

## 2018-04-03 NOTE — ED Notes (Signed)
Pt stands on own ability for ortho VS. Strong on feet. Reports 'vertigo' feeling, but is steady.

## 2018-04-03 NOTE — ED Notes (Signed)
The pt given med for back pain

## 2018-04-03 NOTE — Telephone Encounter (Signed)
rx has been sent to pt pharmacy

## 2018-04-03 NOTE — ED Provider Notes (Signed)
[facility] EMERGENCY DEPARTMENT Provider Note   CSN: [id number] Arrival date & time: 04/03/18  7616    History   Chief Complaint Chief Complaint  Patient presents with  . Hypotension    HPI Danny Fuller is a 43 y.o. male.     The history is provided by the patient, medical records and the spouse. No language interpreter was used.   Danny Fuller is a 43 y.o. male  with a PMH of hypertension, hyperlipidemia, diabetes and CKD who presents to the Emergency Department complaining of concerns for hyperglycemia and hypertension.  Patient reports that over the last 3 days, his blood sugars have been running in the 4-5 100s.  This is very abnormal for him.  He reports that his blood sugars typically run about 200 or less.  He has been compliant with all of his medications.  He does state that his primary care doctor had changed him from a pump to injections due to insurance coverage, but he has been doing this as directed.  He has been compliant with his diet as well.  He did eat a bowl of spaghetti last night, but added additional insulin for the number of carbs.  Over the last 3 days, he has felt a little more tired than usual and little nauseous with eating, but denies any abdominal pain, vomiting, diarrhea, fever, chest pain, trouble breathing, coughing.  This morning, he suddenly felt extremely weak generally. Associated with an unsteady, dizzy sensation and flushed feeling like his whole body was numb.  This was about 30 to 45 minutes after taking his blood pressure medication.  Because he felt so bad, his wife took his blood pressure and it was 60s over 40s.  They rechecked it on the other arm because this seemed so low and it was 70s over 40s.  After taking his blood pressure a third time about 5 or 10 minutes later with blood pressures still in the 07P systolic.  He takes benazepril and labetalol as well as Catapres patch.  Patch was removed in triage upon arrival.   Currently, patient states that he feels fatigued and weak, but not lightheaded as he did when his blood pressures were very low earlier today.   Past Medical History:  Diagnosis Date  . Asthma   . Chronic kidney disease    sees Dr. Jamal Maes    . Diabetes mellitus    sees Dr. Dwyane Dee   . GERD (gastroesophageal reflux disease)   . Headache(784.0)   . Hyperlipidemia   . Hypertension   . Sleep apnea    CPAP    Patient Active Problem List   Diagnosis Date Noted  . Hypotension 04/04/2018  . Leukocytosis 04/04/2018  . Dyslipidemia 04/01/2018  . Pulmonary hypertension ([place]) 04/01/2018  . Chronic low back pain with bilateral sciatica 07/24/2017  . Hyperlipidemia 03/02/2017  . Chest pain 03/02/2017  . Acute kidney failure ([place]) 02/07/2017  . OSA (obstructive sleep apnea) 01/29/2017  . Insomnia 01/29/2017  . PVD (peripheral vascular disease) ([place]) 11/28/2016  . Palpitation 11/28/2016  . Erectile dysfunction associated with type 2 diabetes mellitus ([place]) 05/08/2016  . Poorly controlled diabetes mellitus ([place]) 03/30/2016  . Necrotizing fasciitis ([place]) 03/07/2016  . Pyelonephritis 03/04/2016  . Acquired contracture of Achilles tendon, left 02/15/2016  . Arthralgia of multiple joints 01/19/2016  . Diabetic polyneuropathy associated with type 2 diabetes mellitus ([place]) 01/18/2016  . Acquired absence of other left toe(s) ([place]) 01/18/2016  . Foot amputation  status, left 01/04/2016  . Peripheral neuropathy 11/11/2015  . AKI (acute kidney injury) ([place])   . Asthma 07/23/2015  . Chronic headache 07/23/2015  . Erectile disorder due to medical condition in male 04/23/2015  . BMI 33.0-33.9,adult 04/21/2012  . GERD 04/21/2008  . Type 2 diabetes mellitus with diabetic neuropathy, with long-term current use of insulin ([place]) 11/21/2006  . Essential hypertension 11/21/2006    Past Surgical History:  Procedure Laterality Date  . AMPUTATION Left 07/26/2015   Procedure: AMPUTATION LEFT FIFTH  RAY;  Surgeon: [name], MD;  Location: [facility];  Service: Orthopedics;  Laterality: Left;  . bilateral hip pins placed    . INCISION AND DRAINAGE PERIRECTAL ABSCESS Right 03/07/2016   Procedure: IRRIGATION AND DEBRIDEMENT PERIRECTAL ABSCESS;  Surgeon: Alphonsa Overall, MD;  Location: [facility];  Service: General;  Laterality: Right;  . INCISION AND DRAINAGE PERIRECTAL ABSCESS Right 03/09/2016   Procedure: EXAM UNDER ANESTHESIA, IRRIGATION AND DEBRIDEMENT PERIRECTAL ABSCESS;  Surgeon: Johnathan Hausen, MD;  Location: [facility];  Service: General;  Laterality: Right;  Open, betadine packed wound        Home Medications    Prior to Admission medications   Medication Sig Start Date End Date Taking? Authorizing Provider  benazepril (LOTENSIN) 40 MG tablet Take 1 tablet (40 mg total) by mouth daily. 07/24/17  Yes [name], MD  chlorthalidone (HYGROTON) 25 MG tablet take 1 tablet by mouth once daily Patient taking differently: Take 25 mg by mouth daily.  03/22/17  Yes [name], MD  cloNIDine (CATAPRES - DOSED IN MG/24 HR) 0.1 mg/24hr patch apply 1 patch every week Patient taking differently: Place 0.1 mg onto the skin once a week.  10/22/17  Yes [name], MD  empagliflozin (JARDIANCE) 25 MG TABS tablet Take 25 mg by mouth daily. 09/04/16  Yes [name], MD  furosemide (LASIX) 40 MG tablet Take 1.5 tablets (60 mg total) by mouth daily. 01/01/18  Yes [name], MD  insulin aspart (NOVOLOG) 100 UNIT/ML injection INJECT UNDER THE SKIN UPTO 170 UNITS VIA PUMP Patient taking differently: Inject 20-24 Units into the skin 3 (three) times daily with meals.  03/12/18  Yes [name], MD  insulin glargine (LANTUS) 100 UNIT/ML injection Inject 60 Units into the skin 2 (two) times daily.   Yes [provider]  labetalol (NORMODYNE) 100 MG tablet Take 1 tablet (100 mg total) by mouth 2 (two) times daily. TAKE 1 TABLET BY MOUTH TWICE DAILY. 02/15/18  Yes [name], MD  LYRICA 150 MG capsule Take 1  capsule (150 mg total) by mouth 3 (three) times daily. 11/02/17  Yes Patel, Donika K, DO  nortriptyline (PAMELOR) 10 MG capsule TAKE 4 CAPSULES BY MOUTH AT BEDTIME Patient taking differently: Take 40 mg by mouth at bedtime.  01/28/18  Yes Patel, Donika K, DO  pravastatin (PRAVACHOL) 40 MG tablet Take 1 tablet (40 mg total) by mouth every evening. 05/15/17 04/04/27 Yes Minus Breeding, MD  Continuous Blood Gluc Sensor (FREESTYLE LIBRE SENSOR SYSTEM) MISC APPLY TO UPPER ARM AND CHANGE SENSOR EVERY 10 DAYS 08/07/16   [provider]  glucose blood ([employer] TEST STRIPS) test strip Use to test blood sugar 1 time daily 08/10/16   [provider]  Insulin Disposable Pump (OMNIPOD DASH 5 PACK) MISC 1 Package by Does not apply route every other day. Patient not taking: Reported on 04/03/2018 04/03/18   [name], MD  Insulin Disposable Pump (OMNIPOD DASH SYSTEM) KIT 1 kit by  Does not apply route every other day. Patient not taking: Reported on 04/03/2018 04/03/18   [name], MD  Insulin Pen Needle (FIFTY50 PEN NEEDLES) 31G X 5 MM MISC Use to inject insulin once daily 08/07/16   [provider]  VICTOZA 18 MG/3ML SOPN Inject 0.3 mLs (1.8 mg total) into the skin daily. Inject once daily at the same time Patient not taking: Reported on 04/03/2018 02/15/18   [name], MD    Family History Family History  Problem Relation Age of Onset  . Arthritis Other   . Diabetes Other   . Hypertension Other   . Hyperlipidemia Other   . Stroke Other   . Sudden death Other   . Diabetes Mother   . Diabetes Sister   . Diabetes Brother   . Heart attack Father        Died ate 64, couple of "heart attacks"     Social History Social History   Tobacco Use  . Smoking status: Former Smoker    Packs/day: 0.50    Years: 20.00    Pack years: 10.00    Types: Cigarettes    Last attempt to quit: 07/23/2015    Years since quitting: 2.7  . Smokeless tobacco: Never Used  Substance Use Topics  .  Alcohol use: No    Alcohol/week: 0.0 standard drinks  . Drug use: No     Allergies   Vancomycin   Review of Systems Review of Systems  Constitutional: Positive for fatigue.  Respiratory: Negative for cough and shortness of breath.   Cardiovascular: Negative for chest pain.  Neurological: Positive for dizziness, weakness, light-headedness and numbness. Negative for syncope and headaches.  All other systems reviewed and are negative.    Physical Exam Updated Vital Signs BP 124/73   Pulse 89   Temp 98.3 F (36.8 C) (Oral)   Resp 15   Ht 5' 10"  (1.778 m)   Wt 124 kg   SpO2 100%   BMI 39.22 kg/m   Physical Exam Vitals signs and nursing note reviewed.  Constitutional:      General: He is not in acute distress.    Appearance: He is well-developed.  HENT:     Head: Normocephalic and atraumatic.  Neck:     Musculoskeletal: Neck supple.  Cardiovascular:     Heart sounds: Normal heart sounds. No murmur.     Comments: Mildly tachycardic, but regular. Pulmonary:     Effort: Pulmonary effort is normal. No respiratory distress.     Breath sounds: Normal breath sounds.  Abdominal:     General: There is no distension.     Palpations: Abdomen is soft.     Comments: No abdominal tenderness.  Skin:    General: Skin is warm and dry.  Neurological:     Mental Status: He is alert and oriented to person, place, and time.     Comments: Speech clear and goal oriented. CN 2-12 grossly intact. Normal finger-to-nose and rapid alternating movements. No drift. Strength and sensation intact. Steady gait.      ED Treatments / Results  Labs (all labs ordered are listed, but only abnormal results are displayed) Labs Reviewed  BASIC METABOLIC PANEL - Abnormal; Notable for the following components:      Result Value   Sodium 130 (*)    Chloride 96 (*)    CO2 21 (*)    Glucose, Bld 401 (*)    BUN 30 (*)    Creatinine, Ser 1.98 (*)  GFR calc non Af Amer 41 (*)    GFR calc Af Amer  48 (*)    All other components within normal limits  CBC - Abnormal; Notable for the following components:   WBC 11.8 (*)    All other components within normal limits  URINALYSIS, ROUTINE W REFLEX MICROSCOPIC - Abnormal; Notable for the following components:   Glucose, UA >=500 (*)    All other components within normal limits  CBG MONITORING, ED - Abnormal; Notable for the following components:   Glucose-Capillary 395 (*)    All other components within normal limits  POCT I-STAT EG7 - Abnormal; Notable for the following components:   Sodium 130 (*)    All other components within normal limits  CULTURE, BLOOD (ROUTINE X 2)  CULTURE, BLOOD (ROUTINE X 2)  I-STAT VENOUS BLOOD GAS, ED  I-STAT TROPONIN, ED  CBG MONITORING, ED    EKG EKG Interpretation  Date/Time:  Wednesday April 03 2018 20:45:02 EST Ventricular Rate:  97 PR Interval:    QRS Duration: 92 QT Interval:  336 QTC Calculation: 427 R Axis:   115 Text Interpretation:  Sinus rhythm Probable left atrial enlargement Right axis deviation Minimal ST depression, inferior leads Borderline ST elevation, lateral leads No significant change since last tracing Confirmed by Isla Pence [phone]) on 04/03/2018 8:59:28 PM   Radiology Dg Chest 2 View  Result Date: 04/03/2018 CLINICAL DATA:  Weakness, hypertension and diabetes. EXAM: CHEST - 2 VIEW COMPARISON:  02/07/2017 FINDINGS: The heart size and mediastinal contours are within normal limits. Both lungs are clear. The visualized skeletal structures are unremarkable. IMPRESSION: No active cardiopulmonary disease. Electronically Signed   By: Ashley Royalty M.D.   On: 04/03/2018 20:42    Procedures Procedures (including critical care time)  Medications Ordered in ED Medications  sodium chloride flush (NS) 0.9 % injection 3 mL (has no administration in time range)  sodium chloride 0.9 % bolus 1,000 mL (has no administration in time range)  sodium chloride 0.9 % bolus 1,000 mL (0  mLs Intravenous Stopped 04/03/18 2355)  acetaminophen (TYLENOL) tablet 1,000 mg (1,000 mg Oral Given 04/03/18 2239)  meclizine (ANTIVERT) tablet 50 mg (50 mg Oral Given 04/04/18 0002)     Initial Impression / Assessment and Plan / ED Course  I have reviewed the triage vital signs and the nursing notes.  Pertinent labs & imaging results that were available during my care of the patient were reviewed by me and considered in my medical decision making (see chart for details).       Danny Fuller is a 43 y.o. male who presents to ED for evaluation of generalized weakness and hypotension at home today, as well as hyperglycemia for the last 3 days. Systolic BP's in the 01'K to 70's at home. Clonidine patch removed in triage and BP's have thus far been reassuring 110's-120's/70's-80's. Mildly tachycardic in low 100's. He also reports hyperglycemia at home despite adherence to medication regimen. He is hyperglycemic without evidence of DKA. Does have AKI with creatinine of 1.98 - nearly 2x previous creatinine value a few months ago of 1.06. CXR without acute findings. Troponin wdl and EKG  Without acute changes. UA without signs of infection or ketones/proteins. Given fluid bolus in ED. On re-evaluation after fluid bolus, patient reports now feeling dizzy. Definitely does not look clinically improved. HR and BP have looked great throughout ED stay. He has no focal neuro deficits. His symptoms are worse when he moves his head. He  reports similar experience when in the hospital about a year ago. Per chart review, he did develop vertiginous symptoms while admitted.  At that time, he did get an MRI which was normal.  Started on meclizine with good improvement then. Will give dose of meclizine in ED. Given his AKI, hyperglycemia and persistent symptoms, will admit to hospitalist service. Hospitalist consulted who will admit.    Patient discussed with Dr. Gilford Raid who agrees with treatment plan.    Final  Clinical Impressions(s) / ED Diagnoses   Final diagnoses:  Hypotension  Hyperglycemia  AKI (acute kidney injury) [facility])    ED Discharge Orders    None       , Ozella Almond, PA-C 04/04/18 0038    Isla Pence, MD 04/04/18 [phone]

## 2018-04-03 NOTE — Telephone Encounter (Signed)
Patient informed of this lantus dose at his visit yesterday

## 2018-04-03 NOTE — ED Notes (Signed)
Pt ambulates well. Strong and steady on feet. Stops periodically to close eyes and reports feeling dizzy.

## 2018-04-03 NOTE — ED Notes (Signed)
Pt. Given urinal and aware we need a sample.

## 2018-04-03 NOTE — ED Notes (Signed)
The pt reports that he cannot lift his legs because he has some lumbarf disc damage

## 2018-04-03 NOTE — Telephone Encounter (Signed)
Please send prescription 

## 2018-04-03 NOTE — Telephone Encounter (Signed)
Patient reported that blood sugars were lower this morning:  300.  He was told that Medicare does not pay for the OmniPods, that he will need to call OmniPod and upgrade to the [facility] system.  Medicare pays for this, and it can be sent to his pharmacy here in town.  He agreed to do this.  Danny Fuller, can you please send the script for the [employer], with pods-[id number] 2 days to his local [facility] on [address].  Thank you.

## 2018-04-04 ENCOUNTER — Encounter (HOSPITAL_COMMUNITY): Payer: Self-pay | Admitting: General Practice

## 2018-04-04 ENCOUNTER — Ambulatory Visit: Payer: Medicare Other | Admitting: Family Medicine

## 2018-04-04 DIAGNOSIS — N179 Acute kidney failure, unspecified: Secondary | ICD-10-CM

## 2018-04-04 DIAGNOSIS — Z794 Long term (current) use of insulin: Secondary | ICD-10-CM | POA: Diagnosis not present

## 2018-04-04 DIAGNOSIS — I10 Essential (primary) hypertension: Secondary | ICD-10-CM | POA: Diagnosis not present

## 2018-04-04 DIAGNOSIS — N182 Chronic kidney disease, stage 2 (mild): Secondary | ICD-10-CM | POA: Diagnosis not present

## 2018-04-04 DIAGNOSIS — D72829 Elevated white blood cell count, unspecified: Secondary | ICD-10-CM | POA: Diagnosis present

## 2018-04-04 DIAGNOSIS — I959 Hypotension, unspecified: Secondary | ICD-10-CM | POA: Diagnosis not present

## 2018-04-04 DIAGNOSIS — E785 Hyperlipidemia, unspecified: Secondary | ICD-10-CM | POA: Diagnosis not present

## 2018-04-04 DIAGNOSIS — E1165 Type 2 diabetes mellitus with hyperglycemia: Secondary | ICD-10-CM | POA: Diagnosis not present

## 2018-04-04 DIAGNOSIS — E1140 Type 2 diabetes mellitus with diabetic neuropathy, unspecified: Secondary | ICD-10-CM

## 2018-04-04 DIAGNOSIS — R739 Hyperglycemia, unspecified: Secondary | ICD-10-CM | POA: Diagnosis not present

## 2018-04-04 LAB — CBG MONITORING, ED
Glucose-Capillary: 326 mg/dL — ABNORMAL HIGH (ref 70–99)
Glucose-Capillary: 249 mg/dL — ABNORMAL HIGH (ref 70–99)

## 2018-04-04 LAB — BASIC METABOLIC PANEL
Anion gap: 11 (ref 5–15)
BUN: 30 mg/dL — ABNORMAL HIGH (ref 6–20)
CO2: 22 mmol/L (ref 22–32)
CREATININE: 1.67 mg/dL — AB (ref 0.61–1.24)
Calcium: 8.8 mg/dL — ABNORMAL LOW (ref 8.9–10.3)
Chloride: 101 mmol/L (ref 98–111)
GFR calc Af Amer: 58 mL/min — ABNORMAL LOW (ref >60)
GFR calc non Af Amer: 50 mL/min — ABNORMAL LOW (ref >60)
Glucose, Bld: 287 mg/dL — ABNORMAL HIGH (ref 70–99)
Potassium: 3.4 mmol/L — ABNORMAL LOW (ref 3.5–5.1)
Sodium: 134 mmol/L — ABNORMAL LOW (ref 135–145)

## 2018-04-04 LAB — GLUCOSE, CAPILLARY
Glucose-Capillary: 205 mg/dL — ABNORMAL HIGH (ref 70–99)
Glucose-Capillary: 256 mg/dL — ABNORMAL HIGH (ref 70–99)
Glucose-Capillary: 264 mg/dL — ABNORMAL HIGH (ref 70–99)
Glucose-Capillary: 259 mg/dL — ABNORMAL HIGH (ref 70–99)

## 2018-04-04 LAB — CBC
HCT: 40.9 % (ref 39.0–52.0)
Hemoglobin: 13.4 g/dL (ref 13.0–17.0)
MCH: 28.5 pg (ref 26.0–34.0)
MCHC: 32.8 g/dL (ref 30.0–36.0)
MCV: 87 fL (ref 80.0–100.0)
PLATELETS: 270 10*3/uL (ref 150–400)
RBC: 4.7 MIL/uL (ref 4.22–5.81)
RDW: 12.7 % (ref 11.5–15.5)
WBC: 9.2 10*3/uL (ref 4.0–10.5)
nRBC: 0 % (ref 0.0–0.2)

## 2018-04-04 LAB — HIV ANTIBODY (ROUTINE TESTING W REFLEX): HIV SCREEN 4TH GENERATION: NONREACTIVE

## 2018-04-04 MED ORDER — PRAVASTATIN SODIUM 40 MG PO TABS
40.0000 mg | ORAL_TABLET | Freq: Every evening | ORAL | Status: DC
  Administered 2018-04-05: 40 mg via ORAL
  Filled 2018-04-04: qty 1

## 2018-04-04 MED ORDER — PREGABALIN 75 MG PO CAPS
150.0000 mg | ORAL_CAPSULE | Freq: Three times a day (TID) | ORAL | Status: DC
  Administered 2018-04-04 – 2018-04-06 (×7): 150 mg via ORAL
  Filled 2018-04-04 (×7): qty 2

## 2018-04-04 MED ORDER — CLONIDINE HCL 0.1 MG/24HR TD PTWK
0.1000 mg | MEDICATED_PATCH | TRANSDERMAL | Status: DC
  Administered 2018-04-05: 0.1 mg via TRANSDERMAL
  Filled 2018-04-04: qty 1

## 2018-04-04 MED ORDER — ENOXAPARIN SODIUM 40 MG/0.4ML ~~LOC~~ SOLN
40.0000 mg | SUBCUTANEOUS | Status: DC
  Administered 2018-04-04 – 2018-04-06 (×3): 40 mg via SUBCUTANEOUS
  Filled 2018-04-04 (×3): qty 0.4

## 2018-04-04 MED ORDER — INSULIN ASPART 100 UNIT/ML IV SOLN
5.0000 [IU] | Freq: Once | INTRAVENOUS | Status: AC
  Administered 2018-04-04: 5 [IU] via INTRAVENOUS

## 2018-04-04 MED ORDER — ONDANSETRON HCL 4 MG PO TABS: 4.0000 mg | ORAL_TABLET | Freq: Four times a day (QID) | ORAL | Status: DC | PRN

## 2018-04-04 MED ORDER — SODIUM CHLORIDE 0.9 % IV SOLN
INTRAVENOUS | Status: DC
  Administered 2018-04-04 (×3): via INTRAVENOUS

## 2018-04-04 MED ORDER — VICTOZA 18 MG/3ML ~~LOC~~ SOPN: 1.8000 mg | PEN_INJECTOR | Freq: Every day | SUBCUTANEOUS | 0 refills | Status: DC

## 2018-04-04 MED ORDER — HYDRALAZINE HCL 20 MG/ML IJ SOLN: 5.0000 mg | INTRAMUSCULAR | Status: DC | PRN

## 2018-04-04 MED ORDER — POTASSIUM CHLORIDE CRYS ER 20 MEQ PO TBCR
40.0000 meq | EXTENDED_RELEASE_TABLET | ORAL | Status: AC
  Administered 2018-04-04: 40 meq via ORAL
  Filled 2018-04-04: qty 2

## 2018-04-04 MED ORDER — INSULIN ASPART 100 UNIT/ML ~~LOC~~ SOLN
10.0000 [IU] | Freq: Three times a day (TID) | SUBCUTANEOUS | Status: DC
  Administered 2018-04-04 – 2018-04-06 (×5): 10 [IU] via SUBCUTANEOUS

## 2018-04-04 MED ORDER — INSULIN GLARGINE 100 UNIT/ML ~~LOC~~ SOLN
45.0000 [IU] | Freq: Two times a day (BID) | SUBCUTANEOUS | Status: DC
  Administered 2018-04-04 (×3): 45 [IU] via SUBCUTANEOUS
  Filled 2018-04-04 (×4): qty 0.45

## 2018-04-04 MED ORDER — INSULIN ASPART 100 UNIT/ML ~~LOC~~ SOLN
0.0000 [IU] | Freq: Three times a day (TID) | SUBCUTANEOUS | Status: DC
  Administered 2018-04-04: 5 [IU] via SUBCUTANEOUS
  Administered 2018-04-04: 3 [IU] via SUBCUTANEOUS
  Administered 2018-04-04 – 2018-04-05 (×2): 5 [IU] via SUBCUTANEOUS
  Administered 2018-04-05: 3 [IU] via SUBCUTANEOUS
  Administered 2018-04-05 – 2018-04-06 (×2): 5 [IU] via SUBCUTANEOUS

## 2018-04-04 MED ORDER — ACETAMINOPHEN 325 MG PO TABS
650.0000 mg | ORAL_TABLET | Freq: Four times a day (QID) | ORAL | Status: DC | PRN
  Administered 2018-04-05 – 2018-04-06 (×2): 650 mg via ORAL
  Filled 2018-04-04 (×2): qty 2

## 2018-04-04 MED ORDER — ONDANSETRON HCL 4 MG/2ML IJ SOLN: 4.0000 mg | Freq: Four times a day (QID) | INTRAMUSCULAR | Status: DC | PRN

## 2018-04-04 MED ORDER — ACETAMINOPHEN 650 MG RE SUPP: 650.0000 mg | Freq: Four times a day (QID) | RECTAL | Status: DC | PRN

## 2018-04-04 MED ORDER — SODIUM CHLORIDE 0.9 % IV BOLUS
1000.0000 mL | Freq: Once | INTRAVENOUS | Status: AC
  Administered 2018-04-04: 1000 mL via INTRAVENOUS

## 2018-04-04 MED ORDER — INSULIN GLARGINE 100 UNIT/ML ~~LOC~~ SOLN: 60.0000 [IU] | Freq: Two times a day (BID) | SUBCUTANEOUS | Status: DC

## 2018-04-04 MED ORDER — NORTRIPTYLINE HCL 10 MG PO CAPS
40.0000 mg | ORAL_CAPSULE | Freq: Every day | ORAL | Status: DC
  Administered 2018-04-04 – 2018-04-05 (×2): 40 mg via ORAL
  Filled 2018-04-04 (×3): qty 4

## 2018-04-04 NOTE — Progress Notes (Addendum)
Patient had been admitted after midnight.  Patient was seen and agree with overall assessment and plan except for as noted below.  He reports that he had been urinating more frequently than normal since he ran out of OmniPod for his insulin pump and will not be able to get these until possibly sometime next week.  Blood sugars in the 200s.  Last hemoglobin A1c 10.6 on 11/418/2019.  Diabetic educator consulted and recommended adding 10 units of NovoLog with meals if eating at least 50%.  Changed diet to carb modified per patient request.  He reports that he feels much better and denied having any fever, chills, nausea or vomiting.  Physical exam reveals obese male who appears in no acute distress at this time with regular rate and rhythm.  Blood pressures improved.  Will restart blood pressure medications slowly starting with clonidine patch..  Acute kidney injury superimposed on chronic kidney disease stage II resolving creatinine 1.98 -> 1.67.  Baseline creatinine around 1.1-1.2.  Avoiding diuretics.  Continue IV fluids at current rate of 125 mL/h.  Leukocytosis now resolved.  Potassium of 3.4 replaced.  Likely discharge home in a.m.

## 2018-04-04 NOTE — Progress Notes (Signed)
Patient stated that he didn't want to wear hospital provided CPAP at this time.  RT advised if he changed his mind to contact respiratory.

## 2018-04-04 NOTE — ED Notes (Signed)
RN Gerald Stabs notified of CBG [id number]

## 2018-04-04 NOTE — Patient Instructions (Signed)
Take 30u of Lantus twice daily Take Novolog before each meal.  Stop Lantus for 24 hours before starting the OmniPods Call your family doctor to let him know of your BP reading today. Call if questions.

## 2018-04-04 NOTE — H&P (Signed)
History and Physical    Danny Fuller UGQ:[id number] DOB: 1975/12/27 DOA: 04/03/2018  Referring MD/NP/PA:   PCP: [name], MD   Patient coming from:  The patient is coming from home.  At baseline, pt is independent for most of ADL.        Chief Complaint: Hypotension, dizziness  HPI: Danny Fuller is a 43 y.o. male with medical history significant of hypertension, hyperlipidemia, diabetes mellitus, CKD stage II, asthma, GERD, depression, OSA on CPAP, s/p of amputation of left small toe, who presents with hypotension and dizziness.  Patient states that his blood pressure was elevated in the morning at SBP180s. He took his regular home medications.  Later on he started feeling generalized weakness, dizzy, flushed, feels like nearly fainted, but it did not pass out. His wife took his blood pressure which was 60/40s. They rechecked it on the other arm, and it was 32s over 40s.  Patient is on Lasix, Lotensin, Hygroton, labetalol and labetalol patch. He changed his clonidine patch 2 days ago.  Patient denies any chest pain, shortness breath, cough, fever or chills.  He has nausea, no vomiting, diarrhea, abdominal pain, symptoms of UTI.  No unilateral numbness or tingling his extremities with no facial droop or slurred speech.  No skin boils or infection. His clonidine patch is removed the ED.  ED Course: pt was found to have WBC 11.8, worsening renal function, negative troponin, pending urinalysis, blood sugar 141, anion gap 13, temperature normal, slightly tachycardia, oxygen saturation 95% on room air, chest x-ray negative the patient is placed on telemetry bed for observation.  Review of Systems:   General: no fevers, chills, no body weight gain, has fatigue HEENT: no blurry vision, hearing changes or sore throat Respiratory: no dyspnea, coughing, wheezing CV: no chest pain, no palpitations GI: Has nausea, no vomiting, abdominal pain, diarrhea, constipation GU: no dysuria, burning  on urination, increased urinary frequency, hematuria  Ext: Has trace leg edema Neuro: no unilateral weakness, numbness, or tingling, no vision change or hearing loss.  Has dizziness Skin: no rash, no skin tear. MSK: No muscle spasm, no deformity, no limitation of range of movement in spin Heme: No easy bruising.  Travel history: No recent long distant travel.  Allergy:  Allergies  Allergen Reactions  . Vancomycin Other (See Comments)    AKI    Past Medical History:  Diagnosis Date  . Asthma   . Chronic kidney disease    sees Dr. Jamal Maes    . Diabetes mellitus    sees Dr. Dwyane Dee   . GERD (gastroesophageal reflux disease)   . Headache(784.0)   . Hyperlipidemia   . Hypertension   . Sleep apnea    CPAP    Past Surgical History:  Procedure Laterality Date  . AMPUTATION Left 07/26/2015   Procedure: AMPUTATION LEFT FIFTH RAY;  Surgeon: [name], MD;  Location: [facility];  Service: Orthopedics;  Laterality: Left;  . bilateral hip pins placed    . INCISION AND DRAINAGE PERIRECTAL ABSCESS Right 03/07/2016   Procedure: IRRIGATION AND DEBRIDEMENT PERIRECTAL ABSCESS;  Surgeon: Alphonsa Overall, MD;  Location: [facility];  Service: General;  Laterality: Right;  . INCISION AND DRAINAGE PERIRECTAL ABSCESS Right 03/09/2016   Procedure: EXAM UNDER ANESTHESIA, IRRIGATION AND DEBRIDEMENT PERIRECTAL ABSCESS;  Surgeon: Johnathan Hausen, MD;  Location: [facility];  Service: General;  Laterality: Right;  Open, betadine packed wound    Social History:  reports that he quit smoking about 2 years  ago. His smoking use included cigarettes. He has a 10.00 pack-year smoking history. He has never used smokeless tobacco. He reports that he does not drink alcohol or use drugs.  Family History:  Family History  Problem Relation Age of Onset  . Arthritis Other   . Diabetes Other   . Hypertension Other   . Hyperlipidemia Other   . Stroke Other   . Sudden death Other   . Diabetes Mother   . Diabetes Sister   .  Diabetes Brother   . Heart attack Father        Died ate 61, couple of "heart attacks"      Prior to Admission medications   Medication Sig Start Date End Date Taking? Authorizing Provider  benazepril (LOTENSIN) 40 MG tablet Take 1 tablet (40 mg total) by mouth daily. 07/24/17  Yes [name], MD  chlorthalidone (HYGROTON) 25 MG tablet take 1 tablet by mouth once daily Patient taking differently: Take 25 mg by mouth daily.  03/22/17  Yes [name], MD  cloNIDine (CATAPRES - DOSED IN MG/24 HR) 0.1 mg/24hr patch apply 1 patch every week Patient taking differently: Place 0.1 mg onto the skin once a week.  10/22/17  Yes [name], MD  empagliflozin (JARDIANCE) 25 MG TABS tablet Take 25 mg by mouth daily. 09/04/16  Yes [name], MD  furosemide (LASIX) 40 MG tablet Take 1.5 tablets (60 mg total) by mouth daily. 01/01/18  Yes [name], MD  insulin aspart (NOVOLOG) 100 UNIT/ML injection INJECT UNDER THE SKIN UPTO 170 UNITS VIA PUMP Patient taking differently: Inject 20-24 Units into the skin 3 (three) times daily with meals.  03/12/18  Yes [name], MD  insulin glargine (LANTUS) 100 UNIT/ML injection Inject 60 Units into the skin 2 (two) times daily.   Yes [provider]  labetalol (NORMODYNE) 100 MG tablet Take 1 tablet (100 mg total) by mouth 2 (two) times daily. TAKE 1 TABLET BY MOUTH TWICE DAILY. 02/15/18  Yes [name], MD  LYRICA 150 MG capsule Take 1 capsule (150 mg total) by mouth 3 (three) times daily. 11/02/17  Yes Patel, Donika K, DO  nortriptyline (PAMELOR) 10 MG capsule TAKE 4 CAPSULES BY MOUTH AT BEDTIME Patient taking differently: Take 40 mg by mouth at bedtime.  01/28/18  Yes Patel, Donika K, DO  pravastatin (PRAVACHOL) 40 MG tablet Take 1 tablet (40 mg total) by mouth every evening. 05/15/17 04/04/27 Yes Minus Breeding, MD  Continuous Blood Gluc Sensor (FREESTYLE LIBRE SENSOR SYSTEM) MISC APPLY TO UPPER ARM AND CHANGE SENSOR EVERY 10 DAYS 08/07/16   [provider]  glucose blood ([employer] TEST STRIPS) test strip Use to test blood sugar 1 time daily 08/10/16   [provider]  Insulin Disposable Pump (OMNIPOD DASH 5 PACK) MISC 1 Package by Does not apply route every other day. Patient not taking: Reported on 04/03/2018 04/03/18   [name], MD  Insulin Disposable Pump (OMNIPOD DASH SYSTEM) KIT 1 kit by Does not apply route every other day. Patient not taking: Reported on 04/03/2018 04/03/18   [name], MD  Insulin Pen Needle (FIFTY50 PEN NEEDLES) 31G X 5 MM MISC Use to inject insulin once daily 08/07/16   [provider]  VICTOZA 18 MG/3ML SOPN Inject 0.3 mLs (1.8 mg total) into the skin daily. Inject once daily at the same time Patient not taking: Reported on 04/03/2018 02/15/18   [name], MD    Physical Exam: Vitals:  04/03/18 2100 04/03/18 2130 04/03/18 2230 04/03/18 2300  BP: 113/68 (!) 116/94 119/72 124/73  Pulse: 96 92 88 89  Resp: _0 Temp:      TempSrc:      SpO2: 98% 99% 100% 100%  Weight:      Height:       General: Not in acute distress.  Dry mucus and membrane HEENT:       Eyes: PERRL, EOMI, no scleral icterus.       ENT: No discharge from the ears and nose, no pharynx injection, no tonsillar enlargement.        Neck: No JVD, no bruit, no mass felt. Heme: No neck lymph node enlargement. Cardiac: S1/S2, RRR, No murmurs, No gallops or rubs. Respiratory: No rales, wheezing, rhonchi or rubs. GI: Soft, nondistended, nontender, no rebound pain, no organomegaly, BS present. GU: No hematuria Ext: Has trace leg edema bilaterally. 2+DP/PT pulse bilaterally. Musculoskeletal: No joint deformities, No joint redness or warmth, no limitation of ROM in spin. Skin: No rashes.  Neuro: Alert, oriented X3, cranial nerves II-XII grossly intact, moves all extremities normally. Psych: Patient is not psychotic, no suicidal or hemocidal ideation.  Labs on Admission: I have personally reviewed following labs and  imaging studies  CBC: Recent Labs  Lab 04/03/18 1943 04/03/18 2038  WBC 11.8*  --   HGB 15.4 14.6  HCT 45.0 43.0  MCV 86.0  --   PLT 336  --    Basic Metabolic Panel: Recent Labs  Lab 04/03/18 1943 04/03/18 2038  NA 130* 130*  K 3.7 3.9  CL 96*  --   CO2 21*  --   GLUCOSE 401*  --   BUN 30*  --   CREATININE 1.98*  --   CALCIUM 9.3  --    GFR: Estimated Creatinine Clearance: 65.5 mL/min (A) (by C-G formula based on SCr of 1.98 mg/dL (H)). Liver Function Tests: No results for input(s): AST, ALT, ALKPHOS, BILITOT, PROT, ALBUMIN in the last 168 hours. No results for input(s): LIPASE, AMYLASE in the last 168 hours. No results for input(s): AMMONIA in the last 168 hours. Coagulation Profile: No results for input(s): INR, PROTIME in the last 168 hours. Cardiac Enzymes: No results for input(s): CKTOTAL, CKMB, CKMBINDEX, TROPONINI in the last 168 hours. BNP (last 3 results) No results for input(s): PROBNP in the last 8760 hours. HbA1C: No results for input(s): HGBA1C in the last 72 hours. CBG: Recent Labs  Lab 04/03/18 1952  GLUCAP 395*   Lipid Profile: No results for input(s): CHOL, HDL, LDLCALC, TRIG, CHOLHDL, LDLDIRECT in the last 72 hours. Thyroid Function Tests: No results for input(s): TSH, T4TOTAL, FREET4, T3FREE, THYROIDAB in the last 72 hours. Anemia Panel: No results for input(s): VITAMINB12, FOLATE, FERRITIN, TIBC, IRON, RETICCTPCT in the last 72 hours. Urine analysis:    Component Value Date/Time   COLORURINE YELLOW 04/03/2018 2004   APPEARANCEUR CLEAR 04/03/2018 2004   LABSPEC 1.015 04/03/2018 2004   PHURINE 5.0 04/03/2018 2004   GLUCOSEU >=500 (A) 04/03/2018 2004   GLUCOSEU NEGATIVE 03/23/2016 1502   HGBUR NEGATIVE 04/03/2018 2004   HGBUR negative 04/17/2008 0929   BILIRUBINUR NEGATIVE 04/03/2018 2004   BILIRUBINUR n 04/23/2013 [place] 04/03/2018 2004   PROTEINUR NEGATIVE 04/03/2018 2004   UROBILINOGEN 0.2 03/23/2016 1502    NITRITE NEGATIVE 04/03/2018 2004   LEUKOCYTESUR NEGATIVE 04/03/2018 2004   Sepsis Labs: _1 (procalcitonin:4,lacticidven:4) )No results found for this or any previous visit (from  the past 240 hour(s)).   Radiological Exams on Admission: Dg Chest 2 View  Result Date: 04/03/2018 CLINICAL DATA:  Weakness, hypertension and diabetes. EXAM: CHEST - 2 VIEW COMPARISON:  02/07/2017 FINDINGS: The heart size and mediastinal contours are within normal limits. Both lungs are clear. The visualized skeletal structures are unremarkable. IMPRESSION: No active cardiopulmonary disease. Electronically Signed   By: Ashley Royalty M.D.   On: 04/03/2018 20:42     EKG: Independently reviewed.  Sinus rhythm, QTC 427, LAE, mild ST depression in lead III/aVF   Assessment/Plan Principal Problem:   Hypotension Active Problems:   Type 2 diabetes mellitus with diabetic neuropathy, with long-term current use of insulin (HCC)   Essential hypertension   Acute renal failure superimposed on stage 2 chronic kidney disease (HCC)   Hyperlipidemia   Leukocytosis   Hypotension: Etiology is not clear, possibly due to dehydration and continuation of her multiple blood pressure medications.  Pt is on Lasix, Lotensin, Hygroton, labetalol and labetalol patch.  His blood sugar is elevated, which may have caused dehydration.  Patient has mild leukocytosis with WBC 11.8, but no fever.  No signs of infection.  Chest x-ray negative.  Denies symptoms of UTI.  No skin boils or infection.  His blood pressure is 124/73 after giving 1 liter normal saline bolus in ED.  -Placed on telemetry bed for observation -IV fluid: 2 L normal saline, then 125 cc/h -Hold all blood pressure medications  Type 2 diabetes mellitus with diabetic neuropathy, with long-term current use of insulin ([place]):  Last A1c 10.6 on 12/27/17, poorly controled. Patient is taking NovoLog, Lantus, Victoza, Jardiance at home.  Blood sugar 4 1, but anion gap normal.  No  DKA.  5 units of IV NovoLog is ordered -will decrease Lantus dose from 60-45 unit daily -SSI  Essential hypertension: now has hypotension. -Hold all blood pressure medications -IV hydralazine as needed  Acute renal failure superimposed on stage 2 chronic kidney disease ([place]): Baseline Cre is 1.06 recently, pt's Cre is 1.98 and BUN 30 on admission. Likely due to prerenal secondary to dehydration and continuation of ACEI, ARB.  ATN is also possible due to hypotension. - IVF as above - Follow up renal function by BMP - Hold all blood pressure medications, including Lasix, Lotensin and Hygroton  Hyperlipidemia: -Pravastatin  Leukocytosis: no fever. No signs of infection. CXR negative.  -will follow up blood and UA -follow up by CBC    DVT ppx: SQ Lovenox Code Status: Full code Family Communication: None at bed side.      Disposition Plan:  Anticipate discharge back to previous home environment Consults called:  none Admission status: Obs / tele    Date of Service 04/04/2018    [facility]   If 7PM-7AM, please contact night-coverage [email or web address] Password [email or web address] 04/04/2018, 1:27 AM

## 2018-04-04 NOTE — Plan of Care (Signed)

## 2018-04-04 NOTE — Progress Notes (Signed)
RN rounded on pt. Pt states he does not need anything at this time. 

## 2018-04-04 NOTE — Progress Notes (Addendum)
Inpatient Diabetes Program Recommendations  AACE/ADA: New Consensus Statement on Inpatient Glycemic Control (2015)  Target Ranges:  Prepandial:   less than 140 mg/dL      Peak postprandial:   less than 180 mg/dL (1-2 hours)      Critically ill patients:  140 - 180 mg/dL   Lab Results  Component Value Date   GLUCAP 256 (H) 04/04/2018   HGBA1C 10.6 (H) 12/27/2017    Review of Glycemic ControlResults for Danny Fuller, Danny Fuller "ROD" (MRN [id number]) as of 04/04/2018 12:19  Ref. Range 04/03/2018 19:52 04/04/2018 01:29 04/04/2018 05:16 04/04/2018 07:55 04/04/2018 11:44  Glucose-Capillary Latest Ref Range: 70 - 99 mg/dL 395 (H) 326 (H) 249 (H) 205 (H) 256 (H)    Diabetes history: DM 2 Outpatient Diabetes medications:  Omnipod- currently not using Lantus 60 units bid, Novolog 20-24 units tid with meals, Victoza 0.3 units daily, Jardiance 25 mg daily Current orders for Inpatient glycemic control:  Novolog sensitive tid with meals, Lantus 45 units bid  Inpatient Diabetes Program Recommendations:    -May consider adding Novolog 10 units tid with meals (hold if patient eats less than 50%).    Note that patient recently touched base with Dr. Ronnie Derby CDE regarding his inability to get Omnipods for insulin pump.  He was started on Lantus bid and Novolog with meals until this issue can be resolved.  He also wears Freestyle libre CGM.    Thanks,  Adah Perl, RN, BC-ADM Inpatient Diabetes Coordinator Pager [phone] (8a-5p)  Addendum:  Spoke with patient and wife.  He states that he has contacted the [facility] and has been told that he can get the newest pump and that medicare will follow.  He was on a Freestyle LIbre in the past, however had to stop Fuller/c medicaid would not cover.  He is hoping that he can start back and that this will help with his diabetes management.  Daughter also has Type 1 DM and so they were talking about ways to increase their monitoring.  He plans to f/u with Dr. Dwyane Dee.   He anticipates that A1C will be higher due to his recent problems with getting insulin and pods.  He states that he has no further needs at this time.

## 2018-04-05 ENCOUNTER — Observation Stay (HOSPITAL_COMMUNITY): Payer: Medicare Other

## 2018-04-05 DIAGNOSIS — R200 Anesthesia of skin: Secondary | ICD-10-CM

## 2018-04-05 DIAGNOSIS — I10 Essential (primary) hypertension: Secondary | ICD-10-CM | POA: Diagnosis not present

## 2018-04-05 DIAGNOSIS — N182 Chronic kidney disease, stage 2 (mild): Secondary | ICD-10-CM | POA: Diagnosis not present

## 2018-04-05 DIAGNOSIS — R739 Hyperglycemia, unspecified: Secondary | ICD-10-CM | POA: Diagnosis not present

## 2018-04-05 DIAGNOSIS — E1140 Type 2 diabetes mellitus with diabetic neuropathy, unspecified: Secondary | ICD-10-CM | POA: Diagnosis not present

## 2018-04-05 DIAGNOSIS — I959 Hypotension, unspecified: Secondary | ICD-10-CM | POA: Diagnosis not present

## 2018-04-05 DIAGNOSIS — E1165 Type 2 diabetes mellitus with hyperglycemia: Secondary | ICD-10-CM | POA: Diagnosis not present

## 2018-04-05 DIAGNOSIS — N179 Acute kidney failure, unspecified: Secondary | ICD-10-CM | POA: Diagnosis not present

## 2018-04-05 DIAGNOSIS — E785 Hyperlipidemia, unspecified: Secondary | ICD-10-CM | POA: Diagnosis not present

## 2018-04-05 DIAGNOSIS — Z794 Long term (current) use of insulin: Secondary | ICD-10-CM | POA: Diagnosis not present

## 2018-04-05 DIAGNOSIS — R29818 Other symptoms and signs involving the nervous system: Secondary | ICD-10-CM | POA: Diagnosis not present

## 2018-04-05 LAB — BASIC METABOLIC PANEL
Anion gap: 6 (ref 5–15)
BUN: 16 mg/dL (ref 6–20)
CO2: 24 mmol/L (ref 22–32)
Calcium: 9 mg/dL (ref 8.9–10.3)
Chloride: 105 mmol/L (ref 98–111)
Creatinine, Ser: 1.1 mg/dL (ref 0.61–1.24)
GFR calc Af Amer: >60 mL/min (ref >60)
GFR calc non Af Amer: >60 mL/min (ref >60)
Glucose, Bld: 288 mg/dL — ABNORMAL HIGH (ref 70–99)
Potassium: 4.3 mmol/L (ref 3.5–5.1)
Sodium: 135 mmol/L (ref 135–145)

## 2018-04-05 LAB — COMPREHENSIVE METABOLIC PANEL
ALT: 27 U/L (ref 0–44)
AST: 28 U/L (ref 15–41)
Albumin: 3 g/dL — ABNORMAL LOW (ref 3.5–5.0)
Alkaline Phosphatase: 72 U/L (ref 38–126)
Anion gap: 8 (ref 5–15)
BUN: 21 mg/dL — ABNORMAL HIGH (ref 6–20)
CALCIUM: 8.7 mg/dL — AB (ref 8.9–10.3)
CO2: 23 mmol/L (ref 22–32)
Chloride: 103 mmol/L (ref 98–111)
Creatinine, Ser: 1.29 mg/dL — ABNORMAL HIGH (ref 0.61–1.24)
GFR calc Af Amer: >60 mL/min (ref >60)
Glucose, Bld: 315 mg/dL — ABNORMAL HIGH (ref 70–99)
Potassium: 5.2 mmol/L — ABNORMAL HIGH (ref 3.5–5.1)
Sodium: 134 mmol/L — ABNORMAL LOW (ref 135–145)
Total Bilirubin: 0.9 mg/dL (ref 0.3–1.2)
Total Protein: 5.9 g/dL — ABNORMAL LOW (ref 6.5–8.1)

## 2018-04-05 LAB — GLUCOSE, CAPILLARY
Glucose-Capillary: 312 mg/dL — ABNORMAL HIGH (ref 70–99)
Glucose-Capillary: 287 mg/dL — ABNORMAL HIGH (ref 70–99)
Glucose-Capillary: 218 mg/dL — ABNORMAL HIGH (ref 70–99)
Glucose-Capillary: 298 mg/dL — ABNORMAL HIGH (ref 70–99)
Glucose-Capillary: 235 mg/dL — ABNORMAL HIGH (ref 70–99)

## 2018-04-05 MED ORDER — INSULIN GLARGINE 100 UNIT/ML ~~LOC~~ SOLN
55.0000 [IU] | Freq: Two times a day (BID) | SUBCUTANEOUS | Status: DC
  Administered 2018-04-05 – 2018-04-06 (×3): 55 [IU] via SUBCUTANEOUS
  Filled 2018-04-05 (×3): qty 0.55

## 2018-04-05 MED ORDER — LABETALOL HCL 100 MG PO TABS
100.0000 mg | ORAL_TABLET | Freq: Two times a day (BID) | ORAL | Status: DC
  Administered 2018-04-05 – 2018-04-06 (×3): 100 mg via ORAL
  Filled 2018-04-05 (×3): qty 1

## 2018-04-05 NOTE — Progress Notes (Addendum)
Inpatient Diabetes Program Recommendations  AACE/ADA: New Consensus Statement on Inpatient Glycemic Control (2015)  Target Ranges:  Prepandial:   less than 140 mg/dL      Peak postprandial:   less than 180 mg/dL (1-2 hours)      Critically ill patients:  140 - 180 mg/dL   Lab Results  Component Value Date   GLUCAP 312 (H) 04/05/2018   HGBA1C 10.6 (H) 12/27/2017    Review of Glycemic Control Results for Robinette, Cainen B "ROD" (MRN [id number]) as of 04/05/2018 07:33  Ref. Range 04/04/2018 21:12 04/05/2018 06:52  Glucose-Capillary Latest Ref Range: 70 - 99 mg/dL 259 (H) 312 (H)  Diabetes history: DM 2 Outpatient Diabetes medications:  Omnipod- currently not using Lantus 60 units bid, Novolog 20-24 units tid with meals, Victoza 0.3 units daily, Jardiance 25 mg daily Current orders for Inpatient glycemic control:  Novolog sensitive tid with meals, Lantus 45 units bid, Novolog 10 units tid with meals  Inpatient Diabetes Program Recommendations:    Fasting CBG=312 mg/dL. Consider increasing Lantus to 55 units bid.   Thanks,  Adah Perl, RN, BC-ADM Inpatient Diabetes Coordinator Pager [phone] (8a-5p)  12:50- Call received from MD regarding patient being d/c'd home.  Recommend resumption of home insulin as ordered by Dr. Dwyane Dee.  Patient is in the process of trying to get new pump that is covered by Medicaid.

## 2018-04-06 DIAGNOSIS — I959 Hypotension, unspecified: Secondary | ICD-10-CM | POA: Diagnosis not present

## 2018-04-06 DIAGNOSIS — E1165 Type 2 diabetes mellitus with hyperglycemia: Secondary | ICD-10-CM | POA: Diagnosis not present

## 2018-04-06 LAB — GLUCOSE, CAPILLARY: Glucose-Capillary: 251 mg/dL — ABNORMAL HIGH (ref 70–99)

## 2018-04-06 MED ORDER — INSULIN ASPART 100 UNIT/ML ~~LOC~~ SOLN: 20.0000 [IU] | Freq: Three times a day (TID) | SUBCUTANEOUS | Status: DC

## 2018-04-06 MED ORDER — FUROSEMIDE 40 MG PO TABS: 60.0000 mg | ORAL_TABLET | Freq: Every day | ORAL | 11 refills | Status: DC

## 2018-04-06 MED ORDER — AMLODIPINE BESYLATE 5 MG PO TABS
5.0000 mg | ORAL_TABLET | Freq: Every day | ORAL | Status: DC
  Administered 2018-04-06: 5 mg via ORAL
  Filled 2018-04-06: qty 1

## 2018-04-06 MED ORDER — BENAZEPRIL HCL 40 MG PO TABS
40.0000 mg | ORAL_TABLET | Freq: Every day | ORAL | Status: DC
  Administered 2018-04-06: 40 mg via ORAL
  Filled 2018-04-06: qty 1

## 2018-04-06 NOTE — Progress Notes (Signed)
Progress Note    Danny Fuller  IRC:[id number] DOB: 09/14/1975  DOA: 04/03/2018 PCP: [name], MD    Brief Narrative:   Chief complaint: Dizziness  Medical records reviewed and are as summarized below:  Danny Fuller is an 43 y.o. male with past medical history significant for HTN, HLD, DM type II, CKD stage II, asthma, GERD, OSA, s/p amputation of left small toe; who presents with dizziness  Assessment/Plan:   Principal Problem:   Hypotension Active Problems:   Type 2 diabetes mellitus with diabetic neuropathy, with long-term current use of insulin ([place])   Essential hypertension   Acute renal failure superimposed on stage 2 chronic kidney disease (HCC)   Hyperlipidemia   Leukocytosis  Hypotension 2/2 hypovolemia, essential hypertension:   Multifactorial in the setting of diuretics and uncontrolled diabetes.  Systolic blood pressures initially noted as low as 60s, but improved with holding blood pressure medications.  Blood pressures currently elevated 169/90. -Discontinued IV fluids -Restarted clonidine and labetalol  -Will restart lisinopril as kidney function returning to normal -Hydralazine IV as needed -Still holding diuretics of furosemide and chlorthalidone, but may consider restarting 1 of the diuretics  Acute renal failure superimposed on chronic kidney disease stage II: Resolved.  Baseline creatinine previously noted to be around 1.06.  On admission creatinine 1.98.  Creatinine subsequently 1.67->1.29->1.1. -Recommend outpatient follow-up with his nephrologist  Diabetes mellitus type 2, uncontrolled with diabetic neuropathy: Last hemoglobin A1c 10.6 on 11/19.  Patient unable to get Omni pods for his insulin pump due to changes in insurance, but will get new pump within a week or so.  Blood sugars still remain in the 200-300s. -Appreciate diabetic education, and recommend patient going back home on previous home regimen to follow-up with endocrinology    -Increased Lantus from 45 units to 55 units twice daily per recommendation  -NovoLog sensitive sliding scale and 10 units 3 times daily with meals   -Continue Lyrica and nortriptyline  Right arm numbness: Acute.  Patient reports no recent trauma or sleeping on the arm wrong way.  On recheck electrolytes within normal limits.  Low suspicion for stroke suspect symptoms could be related with patient's neuropathy. -Check CT scan of the brain  Hyperkalemia: Patient noted to have mild elevation of potassium to 5.2 on 2/21.  Repeat potassium within normal limits.  Leukocytosis, POA: Resolved.  No signs of infection noted.  Hyponatremia: Resolved.  Secondary to hypovolemia.  Obesity: Body mass index is 39.22 kg/m.  Outpatient echocardiogram scheduled with Dr. Percival Spanish on 04/01/2018  OSA on home CPAP   Family Communication/Anticipated D/C date and plan/Code Status   DVT prophylaxis: Lovenox ordered. Code Status: Full Code.  Family Communication: Discussed plan of care with the patient and family present at bedside Disposition Plan: Discharge home   Medical Consultants:    None.   Anti-Infectives:    None  Subjective:   Patient reports having numbness in his right arm for most of the day.  Denies any recent trauma or falls.  He is concerned for stroke.  Objective:    Vitals:   04/04/18 2348 04/05/18 0540 04/05/18 1205 04/05/18 2131  BP: (!) 153/90 (!) 165/90 (!) 158/78 (!) 169/90  Pulse: 95 96 94 89  Resp: 19 19 18 17   Temp: (!) 97.5 F (36.4 C) (!) 97.5 F (36.4 C) 98.1 F (36.7 C) 97.9 F (36.6 C)  TempSrc: Axillary Oral Oral Oral  SpO2: 99% 99% 100% 98%  Weight:  Height:        Intake/Output Summary (Last 24 hours) at 04/06/2018 0253 Last data filed at 04/05/2018 2100 Gross per 24 hour  Intake 1245 ml  Output 575 ml  Net 670 ml   Filed Weights   04/03/18 1937  Weight: 124 kg    Exam: Constitutional: Obese NAD, calm, comfortable Eyes: PERRL,  lids and conjunctivae normal ENMT: Mucous membranes are moist. Posterior pharynx clear of any exudate or lesions.Normal dentition.  Neck: normal, supple, no masses, no thyromegaly Respiratory: clear to auscultation bilaterally, no wheezing, no crackles. Normal respiratory effort. No accessory muscle use.  Cardiovascular: Regular rate and rhythm, no murmurs / rubs / gallops.  Trace lower extremity edema. 2+ pedal pulses. No carotid bruits.  Abdomen: no tenderness, no masses palpated. No hepatosplenomegaly. Bowel sounds positive.  Musculoskeletal: no clubbing / cyanosis.  Left toe amputation.  No joint deformity upper and lower extremities. Good ROM, no contractures. Normal muscle tone.  Skin: no rashes, lesions, ulcers. No induration Neurologic: CN 2-12 grossly intact.  Reports decreased sensation of the right arm.  DTR normal. Strength 5/5 in all 4.  Psychiatric: Normal judgment and insight. Alert and oriented x 3.  Anxious mood.    Data Reviewed:   I have personally reviewed following labs and imaging studies:  Labs: Labs show the following:   Basic Metabolic Panel: Recent Labs  Lab 04/03/18 1943 04/03/18 2038 04/04/18 0248 04/05/18 0558 04/05/18 1431  NA 130* 130* 134* 134* 135  K 3.7 3.9 3.4* 5.2* 4.3  CL 96*  --  101 103 105  CO2 21*  --  22 23 24   GLUCOSE 401*  --  287* 315* 288*  BUN 30*  --  30* 21* 16  CREATININE 1.98*  --  1.67* 1.29* 1.10  CALCIUM 9.3  --  8.8* 8.7* 9.0   GFR Estimated Creatinine Clearance: 117.9 mL/min (by C-G formula based on SCr of 1.1 mg/dL). Liver Function Tests: Recent Labs  Lab 04/05/18 0558  AST 28  ALT 27  ALKPHOS 72  BILITOT 0.9  PROT 5.9*  ALBUMIN 3.0*   No results for input(s): LIPASE, AMYLASE in the last 168 hours. No results for input(s): AMMONIA in the last 168 hours. Coagulation profile No results for input(s): INR, PROTIME in the last 168 hours.  CBC: Recent Labs  Lab 04/03/18 1943 04/03/18 2038 04/04/18 0248  WBC  11.8*  --  9.2  HGB 15.4 14.6 13.4  HCT 45.0 43.0 40.9  MCV 86.0  --  87.0  PLT 336  --  270   Cardiac Enzymes: No results for input(s): CKTOTAL, CKMB, CKMBINDEX, TROPONINI in the last 168 hours. BNP (last 3 results) No results for input(s): PROBNP in the last 8760 hours. CBG: Recent Labs  Lab 04/05/18 0652 04/05/18 0934 04/05/18 1131 04/05/18 1646 04/05/18 2118  GLUCAP 312* 287* 218* 298* 235*   D-Dimer: No results for input(s): DDIMER in the last 72 hours. Hgb A1c: No results for input(s): HGBA1C in the last 72 hours. Lipid Profile: No results for input(s): CHOL, HDL, LDLCALC, TRIG, CHOLHDL, LDLDIRECT in the last 72 hours. Thyroid function studies: No results for input(s): TSH, T4TOTAL, T3FREE, THYROIDAB in the last 72 hours.  Invalid input(s): FREET3 Anemia work up: No results for input(s): VITAMINB12, FOLATE, FERRITIN, TIBC, IRON, RETICCTPCT in the last 72 hours. Sepsis Labs: Recent Labs  Lab 04/03/18 1943 04/04/18 0248  WBC 11.8* 9.2    Microbiology Recent Results (from the past 240 hour(s))  Culture,  blood (routine x 2)     Status: None (Preliminary result)   Collection Time: 04/03/18  8:25 PM  Result Value Ref Range Status   Specimen Description BLOOD RIGHT FOREARM  Final   Special Requests   Final    BOTTLES DRAWN AEROBIC AND ANAEROBIC Blood Culture adequate volume Performed at [facility], [place] [address] [place], [place] [address]    Culture NO GROWTH 2 DAYS  Final   Report Status PENDING  Incomplete  Culture, blood (routine x 2)     Status: None (Preliminary result)   Collection Time: 04/03/18  8:29 PM  Result Value Ref Range Status   Specimen Description BLOOD SITE NOT SPECIFIED  Final   Special Requests   Final    BOTTLES DRAWN AEROBIC AND ANAEROBIC Blood Culture adequate volume Performed at [facility], [place] [address] [place], [place] [address]    Culture NO GROWTH 2 DAYS  Final   Report Status PENDING  Incomplete     Procedures and diagnostic studies:  Ct Head Wo Contrast  Result Date: 04/05/2018 CLINICAL DATA:  Focal neuro deficit less than 6 hours. Suspect stroke. Right arm numbness. EXAM: CT HEAD WITHOUT CONTRAST TECHNIQUE: Contiguous axial images were obtained from the base of the skull through the vertex without intravenous contrast. COMPARISON:  MRI head 02/08/2017 FINDINGS: Brain: No evidence of acute infarction, hemorrhage, hydrocephalus, extra-axial collection or mass lesion/mass effect. Vascular: Negative for hyperdense vessel Skull: Negative Sinuses/Orbits: Mild mucosal edema right maxillary sinus otherwise clear. Negative orbit. Other: None IMPRESSION: Normal CT of the brain. Electronically Signed   By: Franchot Gallo M.D.   On: 04/05/2018 19:08    Medications:   . cloNIDine  0.1 mg Transdermal Q Thu-1800  . enoxaparin (LOVENOX) injection  40 mg Subcutaneous Q24H  . insulin aspart  0-9 Units Subcutaneous TID WC  . insulin aspart  10 Units Subcutaneous TID WC  . insulin glargine  55 Units Subcutaneous BID  . labetalol  100 mg Oral BID  . nortriptyline  40 mg Oral QHS  . pravastatin  40 mg Oral QPM  . pregabalin  150 mg Oral TID   Continuous Infusions:   LOS: 0 days   Kamyla Olejnik A Donya Tomaro  [facility]   *Please refer to [email or web address], password [email or web address] to get updated schedule on who will round on this patient, as hospitalists switch teams weekly. If 7PM-7AM, please contact night-coverage at [email or web address], password [email or web address] for any overnight needs.

## 2018-04-06 NOTE — Progress Notes (Signed)
NURSING PROGRESS NOTE  Danny Fuller [id number] Discharge Data: 04/06/2018 1:01 PM Attending Provider: No att. providers found PCP:Danny Fuller     [name] to be D/C'd Home per Fuller order.  Discussed with the patient the After Visit Summary and all questions fully answered. All IV's discontinued with no bleeding noted. All belongings returned to patient for patient to take home.   Last Vital Signs:  Blood pressure (!) 165/95, pulse 96, temperature 97.8 F (36.6 C), temperature source Oral, resp. rate 18, height 5' 10" (1.778 m), weight 124 kg, SpO2 99 %.  Discharge Medication List Allergies as of 04/06/2018      Reactions   Vancomycin Other (See Comments)   AKI      Medication List    STOP taking these medications   chlorthalidone 25 MG tablet Commonly known as:  HYGROTON     TAKE these medications   benazepril 40 MG tablet Commonly known as:  LOTENSIN Take 1 tablet (40 mg total) by mouth daily.   cloNIDine 0.1 mg/24hr patch Commonly known as:  CATAPRES - Dosed in mg/24 hr apply 1 patch every week What changed:    how much to take  how to take this  when to take this  additional instructions   empagliflozin 25 MG Tabs tablet Commonly known as:  JARDIANCE Take 25 mg by mouth daily.   FIFTY50 PEN NEEDLES 31G X 5 MM Misc Generic drug:  Insulin Pen Needle Use to inject insulin once daily   FREESTYLE LIBRE SENSOR SYSTEM Misc APPLY TO UPPER ARM AND CHANGE SENSOR EVERY 10 DAYS   furosemide 40 MG tablet Commonly known as:  LASIX Take 1.5 tablets (60 mg total) by mouth daily. Start taking on:  April 08, 2018 What changed:  These instructions start on April 08, 2018. If you are unsure what to do until then, ask your doctor or other care provider.   insulin aspart 100 UNIT/ML injection Commonly known as:  NOVOLOG Inject 20-24 Units into the skin 3 (three) times daily with meals.   insulin glargine 100 UNIT/ML injection Commonly known as:   LANTUS Inject 60 Units into the skin 2 (two) times daily.   [employer] TEST STRIPS test strip Generic drug:  glucose blood Use to test blood sugar 1 time daily   labetalol 100 MG tablet Commonly known as:  NORMODYNE Take 1 tablet (100 mg total) by mouth 2 (two) times daily. TAKE 1 TABLET BY MOUTH TWICE DAILY.   LYRICA 150 MG capsule Generic drug:  pregabalin Take 1 capsule (150 mg total) by mouth 3 (three) times daily.   nortriptyline 10 MG capsule Commonly known as:  PAMELOR TAKE 4 CAPSULES BY MOUTH AT BEDTIME What changed:    how much to take  how to take this  when to take this  additional instructions   OMNIPOD DASH SYSTEM Kit 1 kit by Does not apply route every other day.   OMNIPOD DASH 5 PACK Misc 1 Package by Does not apply route every other day.   pravastatin 40 MG tablet Commonly known as:  PRAVACHOL Take 1 tablet (40 mg total) by mouth every evening.   VICTOZA 18 MG/3ML Sopn Generic drug:  liraglutide Inject 0.3 mLs (1.8 mg total) into the skin daily. Inject once daily at the same time

## 2018-04-06 NOTE — Discharge Summary (Signed)
Physician Discharge Summary  Danny Fuller WHQ:[id number] DOB: 05-24-1975 DOA: 04/03/2018  PCP: [name], MD  Admit date: 04/03/2018 Discharge date: 04/06/2018  Admitted From: home Discharge disposition: home   Recommendations for Outpatient Follow-Up:   1. BMP early next week-- resume diuretics?     Discharge Diagnosis:   Principal Problem:   Hypotension Active Problems:   Type 2 diabetes mellitus with diabetic neuropathy, with long-term current use of insulin (HCC)   Essential hypertension   Acute renal failure superimposed on stage 2 chronic kidney disease (HCC)   Hyperlipidemia   Leukocytosis    Discharge Condition: Improved.  Diet recommendation: Low sodium, heart healthy.  Carbohydrate-modified.    Wound care: None.  Code status: Full.   History of Present Illness:  Danny Fuller is a 43 y.o. male with medical history significant of hypertension, hyperlipidemia, diabetes mellitus, CKD stage II, asthma, GERD, depression, OSA on CPAP, s/p of amputation of left small toe, who presents with hypotension and dizziness.   Hospital Course by Problem:   Hypotension 2/2 hypovolemia - Multifactorial in the setting of diuretics and uncontrolled diabetes.  Systolic blood pressures initially noted as low as 60s, but improved with holding blood pressure medications -Restarted home meds except: chlorthalidone -follow up outpateint  Acute renal failure superimposed on chronic kidney disease stage II: - Resolved  Diabetes mellitus type 2, uncontrolled with diabetic neuropathy: Last hemoglobin A1c 10.6 on 11/19.  Patient unable to get Omni pods for his insulin pump due to changes in insurance, but will get new pump within a week or so.  Blood sugars still remain in the 200-300s. -Appreciate diabetic education, and recommend patient going back home on previous home regimen to follow-up with endocrinology  -Continue Lyrica and nortriptyline  Right arm  numbness: -odd distrubution -if continues may need further imaging of neck-- some was reproducible at the elbow area -left handed -CT negative per Dr. Tamala Julian  Hyperkalemia:  -resolved  Leukocytosis, POA: Resolved.  No signs of infection noted.   Obesity: Body mass index is 39.22 kg/m.      Medical Consultants:      Discharge Exam:   Vitals:   04/06/18 0523 04/06/18 0840  BP: (!) 163/91 (!) 165/95  Pulse: 92 96  Resp: 20 18  Temp: 98 F (36.7 C) 97.8 F (36.6 C)  SpO2: 100% 99%   Vitals:   04/05/18 1205 04/05/18 2131 04/06/18 0523 04/06/18 0840  BP: (!) 158/78 (!) 169/90 (!) 163/91 (!) 165/95  Pulse: 94 89 92 96  Resp: 18 17 20 18   Temp: 98.1 F (36.7 C) 97.9 F (36.6 C) 98 F (36.7 C) 97.8 F (36.6 C)  TempSrc: Oral Oral Oral Oral  SpO2: 100% 98% 100% 99%  Weight:      Height:        General exam: Appears calm and comfortable.     The results of significant diagnostics from this hospitalization (including imaging, microbiology, ancillary and laboratory) are listed below for reference.     Procedures and Diagnostic Studies:   Dg Chest 2 View  Result Date: 04/03/2018 CLINICAL DATA:  Weakness, hypertension and diabetes. EXAM: CHEST - 2 VIEW COMPARISON:  02/07/2017 FINDINGS: The heart size and mediastinal contours are within normal limits. Both lungs are clear. The visualized skeletal structures are unremarkable. IMPRESSION: No active cardiopulmonary disease. Electronically Signed   By: Ashley Royalty M.D.   On: 04/03/2018 20:42     Labs:   Basic Metabolic  Panel: Recent Labs  Lab 04/03/18 1943 04/03/18 2038 04/04/18 0248 04/05/18 0558 04/05/18 1431  NA 130* 130* 134* 134* 135  K 3.7 3.9 3.4* 5.2* 4.3  CL 96*  --  101 103 105  CO2 21*  --  22 23 24   GLUCOSE 401*  --  287* 315* 288*  BUN 30*  --  30* 21* 16  CREATININE 1.98*  --  1.67* 1.29* 1.10  CALCIUM 9.3  --  8.8* 8.7* 9.0   GFR Estimated Creatinine Clearance: 117.9 mL/min (by C-G  formula based on SCr of 1.1 mg/dL). Liver Function Tests: Recent Labs  Lab 04/05/18 0558  AST 28  ALT 27  ALKPHOS 72  BILITOT 0.9  PROT 5.9*  ALBUMIN 3.0*   No results for input(s): LIPASE, AMYLASE in the last 168 hours. No results for input(s): AMMONIA in the last 168 hours. Coagulation profile No results for input(s): INR, PROTIME in the last 168 hours.  CBC: Recent Labs  Lab 04/03/18 1943 04/03/18 2038 04/04/18 0248  WBC 11.8*  --  9.2  HGB 15.4 14.6 13.4  HCT 45.0 43.0 40.9  MCV 86.0  --  87.0  PLT 336  --  270   Cardiac Enzymes: No results for input(s): CKTOTAL, CKMB, CKMBINDEX, TROPONINI in the last 168 hours. BNP: Invalid input(s): POCBNP CBG: Recent Labs  Lab 04/05/18 0934 04/05/18 1131 04/05/18 1646 04/05/18 2118 04/06/18 0650  GLUCAP 287* 218* 298* 235* 251*   D-Dimer No results for input(s): DDIMER in the last 72 hours. Hgb A1c No results for input(s): HGBA1C in the last 72 hours. Lipid Profile No results for input(s): CHOL, HDL, LDLCALC, TRIG, CHOLHDL, LDLDIRECT in the last 72 hours. Thyroid function studies No results for input(s): TSH, T4TOTAL, T3FREE, THYROIDAB in the last 72 hours.  Invalid input(s): FREET3 Anemia work up No results for input(s): VITAMINB12, FOLATE, FERRITIN, TIBC, IRON, RETICCTPCT in the last 72 hours. Microbiology Recent Results (from the past 240 hour(s))  Culture, blood (routine x 2)     Status: None (Preliminary result)   Collection Time: 04/03/18  8:25 PM  Result Value Ref Range Status   Specimen Description BLOOD RIGHT FOREARM  Final   Special Requests   Final    BOTTLES DRAWN AEROBIC AND ANAEROBIC Blood Culture adequate volume Performed at [facility], [address], [place], [place] [address]    Culture NO GROWTH 2 DAYS  Final   Report Status PENDING  Incomplete  Culture, blood (routine x 2)     Status: None (Preliminary result)   Collection Time: 04/03/18  8:29 PM  Result Value Ref Range Status    Specimen Description BLOOD SITE NOT SPECIFIED  Final   Special Requests   Final    BOTTLES DRAWN AEROBIC AND ANAEROBIC Blood Culture adequate volume Performed at [facility], [place] [address] [place], [place] [address]    Culture NO GROWTH 2 DAYS  Final   Report Status PENDING  Incomplete     Discharge Instructions:   Discharge Instructions    Diet - low sodium heart healthy   Complete by:  As directed    Diet Carb Modified   Complete by:  As directed    Discharge instructions   Complete by:  As directed    If blood sugars are high, may not need as much diuretic (ie lasix/chlorthalidone)-- will hold chlorthalidone for now- resume lasix on Monday== will need lab work Tuesday/Wednesday. Once your blood sugars are improved, may  be able to resume all of your diuretics. -keep log of blood sugars and bring to PCP/Endo -keep log of blood pressure- bring to PCP for medication adjustments   Increase activity slowly   Complete by:  As directed      Allergies as of 04/06/2018      Reactions   Vancomycin Other (See Comments)   AKI      Medication List    STOP taking these medications   chlorthalidone 25 MG tablet Commonly known as:  HYGROTON     TAKE these medications   benazepril 40 MG tablet Commonly known as:  LOTENSIN Take 1 tablet (40 mg total) by mouth daily.   cloNIDine 0.1 mg/24hr patch Commonly known as:  CATAPRES - Dosed in mg/24 hr apply 1 patch every week What changed:    how much to take  how to take this  when to take this  additional instructions   empagliflozin 25 MG Tabs tablet Commonly known as:  JARDIANCE Take 25 mg by mouth daily.   FIFTY50 PEN NEEDLES 31G X 5 MM Misc Generic drug:  Insulin Pen Needle Use to inject insulin once daily   FREESTYLE LIBRE SENSOR SYSTEM Misc APPLY TO UPPER ARM AND CHANGE SENSOR EVERY 10 DAYS   furosemide 40 MG tablet Commonly known as:  LASIX Take 1.5 tablets (60 mg total) by mouth daily. Start taking on:   April 08, 2018 What changed:  These instructions start on April 08, 2018. If you are unsure what to do until then, ask your doctor or other care provider.   insulin aspart 100 UNIT/ML injection Commonly known as:  NOVOLOG Inject 20-24 Units into the skin 3 (three) times daily with meals.   insulin glargine 100 UNIT/ML injection Commonly known as:  LANTUS Inject 60 Units into the skin 2 (two) times daily.   [employer] TEST STRIPS test strip Generic drug:  glucose blood Use to test blood sugar 1 time daily   labetalol 100 MG tablet Commonly known as:  NORMODYNE Take 1 tablet (100 mg total) by mouth 2 (two) times daily. TAKE 1 TABLET BY MOUTH TWICE DAILY.   LYRICA 150 MG capsule Generic drug:  pregabalin Take 1 capsule (150 mg total) by mouth 3 (three) times daily.   nortriptyline 10 MG capsule Commonly known as:  PAMELOR TAKE 4 CAPSULES BY MOUTH AT BEDTIME What changed:    how much to take  how to take this  when to take this  additional instructions   OMNIPOD DASH SYSTEM Kit 1 kit by Does not apply route every other day.   OMNIPOD DASH 5 PACK Misc 1 Package by Does not apply route every other day.   pravastatin 40 MG tablet Commonly known as:  PRAVACHOL Take 1 tablet (40 mg total) by mouth every evening.   VICTOZA 18 MG/3ML Sopn Generic drug:  liraglutide Inject 0.3 mLs (1.8 mg total) into the skin daily. Inject once daily at the same time      Follow-up Information    [name], MD Follow up in 1 week(s).   Specialty:  Family Medicine Why:  BMP Contact information: [place] [address] [phone]        Minus Breeding, MD .   Specialty:  Cardiology Contact information: [address] [place] [address] [phone]            Time coordinating discharge: 25 min  Signed:  Geradine Girt DO  [facility]  04/06/2018, 9:51 AM

## 2018-04-08 ENCOUNTER — Ambulatory Visit (HOSPITAL_COMMUNITY): Payer: Medicare Other | Attending: Cardiology

## 2018-04-08 DIAGNOSIS — I272 Pulmonary hypertension, unspecified: Secondary | ICD-10-CM

## 2018-04-08 LAB — CULTURE, BLOOD (ROUTINE X 2)
CULTURE: NO GROWTH
Culture: NO GROWTH
Special Requests: ADEQUATE
Special Requests: ADEQUATE

## 2018-04-09 ENCOUNTER — Ambulatory Visit: Payer: Medicare Other

## 2018-04-09 ENCOUNTER — Telehealth: Payer: Self-pay | Admitting: Endocrinology

## 2018-04-09 NOTE — Telephone Encounter (Signed)
Patient called [facility] yesterday requesting a call back from [place] to discuss his insulin pump

## 2018-04-09 NOTE — Progress Notes (Deleted)
04/09/2018 Danny Fuller 07-07-1975 [id number]   HPI:  Danny Fuller is a 43 y.o. male patient of Dr Percival Spanish, with a PMH below who presents today for hypertension clinic evaluation.  Blood Pressure Goal:  130/80  Current Medications:  Family Hx:  Social Hx:  Diet:  Exercise:  Home BP readings:  Intolerances:   Labs:  Wt Readings from Last 3 Encounters:  04/03/18 273 lb 5.9 oz (124 kg)  04/01/18 271 lb 12.8 oz (123.3 kg)  02/15/18 283 lb 9.6 oz (128.6 kg)   BP Readings from Last 3 Encounters:  04/06/18 (!) 165/95  04/01/18 (!) 178/106  02/15/18 (!) 160/90   Pulse Readings from Last 3 Encounters:  04/06/18 96  04/01/18 89  02/15/18 95    Current Outpatient Medications  Medication Sig Dispense Refill  . benazepril (LOTENSIN) 40 MG tablet Take 1 tablet (40 mg total) by mouth daily. 90 tablet 3  . cloNIDine (CATAPRES - DOSED IN MG/24 HR) 0.1 mg/24hr patch apply 1 patch every week (Patient taking differently: Place 0.1 mg onto the skin once a week. ) 4 patch 3  . Continuous Blood Gluc Sensor (FREESTYLE LIBRE SENSOR SYSTEM) MISC APPLY TO UPPER ARM AND CHANGE SENSOR EVERY 10 DAYS    . empagliflozin (JARDIANCE) 25 MG TABS tablet Take 25 mg by mouth daily. 30 tablet 3  . furosemide (LASIX) 40 MG tablet Take 1.5 tablets (60 mg total) by mouth daily. 45 tablet 11  . glucose blood ([employer] TEST STRIPS) test strip Use to test blood sugar 1 time daily    . insulin aspart (NOVOLOG) 100 UNIT/ML injection Inject 20-24 Units into the skin 3 (three) times daily with meals.    . Insulin Disposable Pump (OMNIPOD DASH 5 PACK) MISC 1 Package by Does not apply route every other day. (Patient not taking: Reported on 04/03/2018) 6 each 3  . Insulin Disposable Pump (OMNIPOD DASH SYSTEM) KIT 1 kit by Does not apply route every other day. (Patient not taking: Reported on 04/03/2018) 1 kit 0  . insulin glargine (LANTUS) 100 UNIT/ML injection Inject 60 Units into the skin 2 (two) times  daily.    . Insulin Pen Needle (FIFTY50 PEN NEEDLES) 31G X 5 MM MISC Use to inject insulin once daily    . labetalol (NORMODYNE) 100 MG tablet Take 1 tablet (100 mg total) by mouth 2 (two) times daily. TAKE 1 TABLET BY MOUTH TWICE DAILY. 60 tablet 3  . LYRICA 150 MG capsule Take 1 capsule (150 mg total) by mouth 3 (three) times daily. 90 capsule 5  . nortriptyline (PAMELOR) 10 MG capsule TAKE 4 CAPSULES BY MOUTH AT BEDTIME (Patient taking differently: Take 40 mg by mouth at bedtime. ) 360 capsule 1  . pravastatin (PRAVACHOL) 40 MG tablet Take 1 tablet (40 mg total) by mouth every evening. 90 tablet 3  . VICTOZA 18 MG/3ML SOPN Inject 0.3 mLs (1.8 mg total) into the skin daily. Inject once daily at the same time 3 pen 0   No current facility-administered medications for this visit.     Allergies  Allergen Reactions  . Vancomycin Other (See Comments)    AKI    Past Medical History:  Diagnosis Date  . Asthma   . Chronic kidney disease    sees Dr. Jamal Maes    . Diabetes mellitus    sees Dr. Dwyane Dee   . GERD (gastroesophageal reflux disease)   . Headache(784.0)   . Hyperlipidemia   .  Hypertension   . Sleep apnea    CPAP    There were no vitals taken for this visit.  No problem-specific Assessment & Plan notes found for this encounter.   Tommy Medal PharmD CPP [facility] [address] [place], [place] [address] [phone]

## 2018-04-09 NOTE — Telephone Encounter (Signed)
I phoned patient and he said the pods have not come from [facility].  He was told that he has to call them to verify that he wants them.  He agreed to do this, and said he has the telephone number to call that I gave him last week.

## 2018-04-10 ENCOUNTER — Telehealth: Payer: Self-pay | Admitting: *Deleted

## 2018-04-10 NOTE — Telephone Encounter (Signed)
Transition Care Management Follow-up Telephone Call   Date discharged?   Admit date: 04/03/2018  Discharge date: 04/06/2018   Admitted From: home  Discharge disposition: home   How have you been since you were released from the hospital? " Blood pressure is around "normal", 140/90s. Stopped taking one of the BP pills I was taking - Lasix. Overall, I feel a little bit better. I am having trouble with my blood sugars because I have not been able to get my insulin pumps"   Do you understand why you were in the hospital? yes   Do you understand the discharge instructions? yes   Where were you discharged to? Home with Wife and children   Items Reviewed:  Medications reviewed: yes, lasix was stopped, discuss at follow medication plan  Allergies reviewed: yes  Dietary changes reviewed: yes  Referrals reviewed: yes   Functional Questionnaire:   Activities of Daily Living (ADLs):   He states they are independent in the following: no issues, back to normal routine. States they require assistance with the following: n/a   Any transportation issues/concerns?: no   Any patient concerns? No, pain management   Confirmed importance and date/time of follow-up visits scheduled yes  Provider Appointment booked with Dr Sarajane Jews 04/16/2018 at 10:45am  Confirmed with patient if condition begins to worsen call PCP or go to the ER.  Patient was given the office number and encouraged to call back with question or concerns.  : yes

## 2018-04-15 ENCOUNTER — Other Ambulatory Visit (INDEPENDENT_AMBULATORY_CARE_PROVIDER_SITE_OTHER): Payer: Medicare Other

## 2018-04-15 ENCOUNTER — Other Ambulatory Visit: Payer: Self-pay

## 2018-04-15 DIAGNOSIS — Z794 Long term (current) use of insulin: Secondary | ICD-10-CM | POA: Diagnosis not present

## 2018-04-15 DIAGNOSIS — E1165 Type 2 diabetes mellitus with hyperglycemia: Secondary | ICD-10-CM

## 2018-04-15 LAB — BASIC METABOLIC PANEL
BUN: 17 mg/dL (ref 6–23)
CO2: 25 mEq/L (ref 19–32)
Calcium: 9.5 mg/dL (ref 8.4–10.5)
Chloride: 97 mEq/L (ref 96–112)
Creatinine, Ser: 1.18 mg/dL (ref 0.40–1.50)
GFR: 82.42 mL/min (ref >60.00)
GLUCOSE: 438 mg/dL — AB (ref 70–99)
Potassium: 4.5 mEq/L (ref 3.5–5.1)
SODIUM: 131 meq/L — AB (ref 135–145)

## 2018-04-15 LAB — MICROALBUMIN / CREATININE URINE RATIO
Creatinine,U: 81.3 mg/dL
Microalb Creat Ratio: 124 mg/g — ABNORMAL HIGH (ref 0.0–30.0)
Microalb, Ur: 100.9 mg/dL — ABNORMAL HIGH (ref 0.0–1.9)

## 2018-04-15 LAB — HEMOGLOBIN A1C: Hgb A1c MFr Bld: 11.6 % — ABNORMAL HIGH (ref 4.6–6.5)

## 2018-04-16 ENCOUNTER — Encounter: Payer: Self-pay | Admitting: Family Medicine

## 2018-04-16 ENCOUNTER — Ambulatory Visit (INDEPENDENT_AMBULATORY_CARE_PROVIDER_SITE_OTHER): Payer: Medicare Other | Admitting: Family Medicine

## 2018-04-16 VITALS — BP 158/98 | HR 96 | Temp 97.8°F | Wt 271.5 lb

## 2018-04-16 DIAGNOSIS — E1140 Type 2 diabetes mellitus with diabetic neuropathy, unspecified: Secondary | ICD-10-CM | POA: Diagnosis not present

## 2018-04-16 DIAGNOSIS — Z794 Long term (current) use of insulin: Secondary | ICD-10-CM | POA: Diagnosis not present

## 2018-04-16 DIAGNOSIS — G63 Polyneuropathy in diseases classified elsewhere: Secondary | ICD-10-CM

## 2018-04-16 DIAGNOSIS — N182 Chronic kidney disease, stage 2 (mild): Secondary | ICD-10-CM

## 2018-04-16 DIAGNOSIS — N179 Acute kidney failure, unspecified: Secondary | ICD-10-CM

## 2018-04-16 DIAGNOSIS — I10 Essential (primary) hypertension: Secondary | ICD-10-CM

## 2018-04-16 LAB — FRUCTOSAMINE: Fructosamine: 437 umol/L — ABNORMAL HIGH (ref 0–285)

## 2018-04-16 MED ORDER — TRAMADOL HCL 50 MG PO TABS: 100.0000 mg | ORAL_TABLET | Freq: Four times a day (QID) | ORAL | 5 refills | Status: DC | PRN

## 2018-04-16 MED ORDER — CLONIDINE 0.2 MG/24HR TD PTWK: 0.2000 mg | MEDICATED_PATCH | TRANSDERMAL | 3 refills | Status: DC

## 2018-04-16 MED ORDER — TADALAFIL 20 MG PO TABS: 20.0000 mg | ORAL_TABLET | Freq: Every day | ORAL | 5 refills | Status: DC | PRN

## 2018-04-16 NOTE — Progress Notes (Signed)
Patient ID: Danny Fuller, male   DOB: 08-11-1975, 43 y.o.   MRN: [id number]            Reason for Appointment: Follow-up for Type 2 Diabetes and hypertension  Referring physician: Sarajane Jews   History of Present Illness:          Date of diagnosis of type 2 diabetes mellitus: 2007        Background history:   At diagnosis he was relatively asymptomatic and was started on metformin and glipizide He has been on the same oral hypoglycemic regimen for several years. Review of his A1c indicates it has been markedly increased persistently with the lowest reading in 2015 being 9.1  Recent history:   INSULIN regimen is:  Lantus and NovoLog irregularly  Previously on OMNIPOD pump since 01/05/17  BASAL rate 5.5  Carbohydrate ratio 1:10, sensitivity 1:15 and target 120 Active insulin 3 hours  Non-insulin hypoglycemic drugs the patient is taking are: Metformin ER 2000 mg daily, Jardiance 25 mg daily, Victoza 1.2 mg daily  His A1c in November was 10.6 and is now 11.5   Current management, blood sugar patterns and problems identified:  He has been having difficulty getting his pump supplies with Medicare coverage and has not used the pump for at least 2 months  However he is not checking his blood sugars much also  With not using his pump for he is not taking NovoLog even before his meal coverage  At the same time he is also likely not taking his Lantus consistently with blood sugars occasionally over 400  Also last month he was admitted with hyperglycemia, low blood pressure and dehydration  He is now back on his Jardiance and is also taking Victoza  He thinks he is trying to exercise  Weight is slightly lower        Side effects from medications have been: None  Compliance with the medical regimen: Fair  Glucose monitoring:  with freestyle meter on the Omnipod  Blood Glucose readings are being checked regularly and recently with blood sugar range 52-381 and low readings  since Friday  Previous readings:  PRE-MEAL Fasting Lunch Dinner Bedtime Overall  Glucose range:  55-244  64-301  142-326  147-346   Mean/median:  113  190  190  190  181     Self-care: Meal times are:  Breakfast is at 9-10 am, Lunch-1 PM : Dinner: 8 pm   Typical meal intake: Breakfast is Usually toast and boiled eggs.  Usually getting cold cut sandwiches at lunch, grilled chicken and vegetables/potatoes at dinner.  Snacks are usually chips, crackers                Dietician visit, most recent: 05/02/16               Exercise:  some walking, limited by pain  Weight history: His highest weight in the past has been 378    Wt Readings from Last 3 Encounters:  04/17/18 270 lb 9.6 oz (122.7 kg)  04/16/18 271 lb 8 oz (123.2 kg)  04/03/18 273 lb 5.9 oz (124 kg)    Glycemic control:   Lab Results  Component Value Date   HGBA1C 11.6 (H) 04/15/2018   HGBA1C 10.6 (H) 12/27/2017   HGBA1C 12.3 (A) 09/17/2017   Lab Results  Component Value Date   MICROALBUR 100.9 (H) 04/15/2018   LDLCALC 51 03/23/2016   CREATININE 1.18 04/15/2018   Lab Results  Component Value Date  MICRALBCREAT 124.0 (H) 04/15/2018    Lab Results  Component Value Date   FRUCTOSAMINE 437 (H) 04/15/2018   FRUCTOSAMINE 570 (H) 10/16/2017   FRUCTOSAMINE 380 (H) 07/12/2017      Allergies as of 04/17/2018      Reactions   Vancomycin Other (See Comments)   AKI      Medication List       Accurate as of April 17, 2018  9:39 AM. Always use your most recent med list.        benazepril 40 MG tablet Commonly known as:  LOTENSIN Take 1 tablet (40 mg total) by mouth daily.   cloNIDine 0.2 mg/24hr patch Commonly known as:  CATAPRES-TTS-2 Place 1 patch (0.2 mg total) onto the skin once a week.   empagliflozin 25 MG Tabs tablet Commonly known as:  JARDIANCE Take 25 mg by mouth daily.   FIFTY50 PEN NEEDLES 31G X 5 MM Misc Generic drug:  Insulin Pen Needle Use to inject insulin once daily   insulin  aspart 100 UNIT/ML injection Commonly known as:  NOVOLOG Inject 20-24 Units into the skin 3 (three) times daily with meals.   insulin glargine 100 UNIT/ML injection Commonly known as:  LANTUS Inject 60 Units into the skin 2 (two) times daily.   [employer] TEST STRIPS test strip Generic drug:  glucose blood Use to test blood sugar 1 time daily   labetalol 100 MG tablet Commonly known as:  NORMODYNE Take 1 tablet (100 mg total) by mouth 2 (two) times daily. TAKE 1 TABLET BY MOUTH TWICE DAILY.   LYRICA 150 MG capsule Generic drug:  pregabalin Take 1 capsule (150 mg total) by mouth 3 (three) times daily.   nortriptyline 10 MG capsule Commonly known as:  PAMELOR TAKE 4 CAPSULES BY MOUTH AT BEDTIME   pravastatin 40 MG tablet Commonly known as:  PRAVACHOL Take 1 tablet (40 mg total) by mouth every evening.   tadalafil 20 MG tablet Commonly known as:  ADCIRCA/CIALIS Take 1 tablet (20 mg total) by mouth daily as needed for erectile dysfunction.   traMADol 50 MG tablet Commonly known as:  ULTRAM Take 2 tablets (100 mg total) by mouth every 6 (six) hours as needed for moderate pain.   VICTOZA 18 MG/3ML Sopn Generic drug:  liraglutide Inject 0.3 mLs (1.8 mg total) into the skin daily. Inject once daily at the same time       Allergies:  Allergies  Allergen Reactions  . Vancomycin Other (See Comments)    AKI    Past Medical History:  Diagnosis Date  . Asthma   . Chronic kidney disease    sees Dr. Jamal Maes    . Diabetes mellitus    sees Dr. Dwyane Dee   . GERD (gastroesophageal reflux disease)   . Headache(784.0)   . Hyperlipidemia   . Hypertension   . Sleep apnea    CPAP    Past Surgical History:  Procedure Laterality Date  . AMPUTATION Left 07/26/2015   Procedure: AMPUTATION LEFT FIFTH RAY;  Surgeon: [name], MD;  Location: [facility];  Service: Orthopedics;  Laterality: Left;  . bilateral hip pins placed    . INCISION AND DRAINAGE PERIRECTAL ABSCESS Right  03/07/2016   Procedure: IRRIGATION AND DEBRIDEMENT PERIRECTAL ABSCESS;  Surgeon: Alphonsa Overall, MD;  Location: [facility];  Service: General;  Laterality: Right;  . INCISION AND DRAINAGE PERIRECTAL ABSCESS Right 03/09/2016   Procedure: EXAM UNDER ANESTHESIA, IRRIGATION AND DEBRIDEMENT PERIRECTAL ABSCESS;  Surgeon: Johnathan Hausen,  MD;  Location: [facility];  Service: General;  Laterality: Right;  Open, betadine packed wound    Family History  Problem Relation Age of Onset  . Arthritis Other   . Diabetes Other   . Hypertension Other   . Hyperlipidemia Other   . Stroke Other   . Sudden death Other   . Diabetes Mother   . Diabetes Sister   . Diabetes Brother   . Heart attack Father        Died ate 40, couple of "heart attacks"     Social History:  reports that he quit smoking about 2 years ago. His smoking use included cigarettes. He has a 10.00 pack-year smoking history. He has never used smokeless tobacco. He reports that he does not drink alcohol or use drugs.   Review of Systems   Lipid history: LDL has been below 100, mild increase in triglycerides present Has been started on pravastatin by cardiologist    Lab Results  Component Value Date   CHOL 144 07/12/2017   HDL 28.90 (L) 07/12/2017   LDLCALC 51 03/23/2016   LDLDIRECT 89.0 07/12/2017   TRIG 201.0 (H) 07/12/2017   CHOLHDL 5 07/12/2017           Hypertension: On treatment for several years  Currently he is taking benazepril  along with the 0.2 mg clonidine patch On labetalol 200 mg twice daily, has seen the nephrologist last month Also has seen his PCP yesterday, chlorthalidone has been stopped  Blood pressure is consistently high   BP Readings from Last 3 Encounters:  04/17/18 (!) 150/92  04/16/18 (!) 158/98  04/06/18 (!) 165/95    EDEMA: He has history of significant leg edema He is not taking Lasix, previously on 60 mg for leg edema, now controlled   Lab Results  Component Value Date   CREATININE 1.18  04/15/2018   BUN 17 04/15/2018   NA 131 (L) 04/15/2018   K 4.5 04/15/2018   CL 97 04/15/2018   CO2 25 04/15/2018    Neuropathy: He has sharp pains in his feet from neuropathy,  on Lyrica without adequate control of his pain and numbness Followed by neurologist Also on nortriptyline at bedtime, now taking 40 mg and asking about simplifying his regimen He does not think duloxetine has helped before   He was recommended diabetic shoes by podiatrist   LABS:  Lab on 04/15/2018  Component Date Value Ref Range Status  . Microalb, Ur 04/15/2018 100.9* 0.0 - 1.9 mg/dL Final  . Creatinine,U 04/15/2018 81.3  mg/dL Final  . Microalb Creat Ratio 04/15/2018 124.0* 0.0 - 30.0 mg/g Final  . Hgb A1c MFr Bld 04/15/2018 11.6* 4.6 - 6.5 % Final   Glycemic Control Guidelines for People with Diabetes:Non Diabetic:  <6%Goal of Therapy: <7%Additional Action Suggested:  >8%   . Fructosamine 04/15/2018 437* 0 - 285 umol/L Final   Comment: Published reference interval for apparently healthy subjects between age 41 and 3 is 38 - 285 umol/L and in a poorly controlled diabetic population is 228 - 563 umol/L with a mean of 396 umol/L.   Marland Kitchen Sodium 04/15/2018 131* 135 - 145 mEq/L Final  . Potassium 04/15/2018 4.5  3.5 - 5.1 mEq/L Final  . Chloride 04/15/2018 97  96 - 112 mEq/L Final  . CO2 04/15/2018 25  19 - 32 mEq/L Final  . Glucose, Bld 04/15/2018 438* 70 - 99 mg/dL Final  . BUN 04/15/2018 17  6 - 23 mg/dL Final  .  Creatinine, Ser 04/15/2018 1.18  0.40 - 1.50 mg/dL Final  . Calcium 04/15/2018 9.5  8.4 - 10.5 mg/dL Final  . GFR 04/15/2018 82.42  >60.00 mL/min Final    Physical Examination:  BP (!) 150/92 (BP Location: Left Arm, Patient Position: Sitting, Cuff Size: Large)   Pulse (!) 101   Ht 5\' 10"  (1.778 m)   Wt 270 lb 9.6 oz (122.7 kg)   SpO2 96%   BMI 38.83 kg/m     ASSESSMENT:  Diabetes type 2, uncontrolled, insulin-dependent   See history of present illness for detailed discussion  of current diabetes management, blood sugar patterns and problems identified  His A1c has gone up to 11.5  His blood sugars are being monitored infrequently and he has not been compliant with taking his insulin as directed Currently having difficulty getting pump supplies and has not been motivated to do much with blood sugars going up over 400 at times   HYPERTENSION: Blood pressure appears persistently high He will continue to follow-up with PCP and nephrologist   Hyponatremia: This is mild,  asymptomatic  PLAN:     Needs consistent monitoring blood sugars 4 times a day  With documentation he can be eligible for the freestyle libre  Will need to try and get his prescription sent for his insulin pump supplies and he will be given the phone number for the local contact  Since his renal function is back to normal he will continue Jardiance  Continue to monitor for edema and may need to go back on Lasix  Meanwhile discussed how to take NovoLog for carbohydrate coverage as well as correction doses at least twice a day If his fasting readings are still high with Lantus twice a day we may need to increase it Call if having issues Follow-up in 1 month   Patient Instructions  Lantus 60 am and pm so am sugar < 150  Carb coverage with shots divide by 5 plus correction doses  Novolog 3x daily  Total visit time for evaluation and management of multiple problems and counseling =25 minutes  [name] 04/17/2018, 9:39 AM   Note: This office note was prepared with Dragon voice recognition system technology. Any transcriptional errors that result from this process are unintentional.

## 2018-04-16 NOTE — Progress Notes (Signed)
   Subjective:    Patient ID: Danny Fuller, male    DOB: [birth year], 43 y.o.   MRN: [id number]  HPI Here for a transitional care follow up on a hospital stay from 04-03-18 to 04-06-18 for acute renal failure from dehydration. He has been seeing Dr. Dwyane Dee for his diabetes, and he has been using an insulin pump. Unfortunately he has recently had trouble getting his insulin supplies on time and he has done a poor job managing his diet. His glucoses have been running in the 300s and 400s. When he was admitted he was dehydrated and his creatine was up to 1.67. He was given IV fluids and his glucoses were brought under control. His diuretics (Lasix and Chlorthalidone) were stopped. He has impporved his diet. His A1c yesterday was still too high at 11.6, but his creatinine was down to normal at 1.18. he is scheduled to see Dr. Dwyane Dee tomorrow.    Review of Systems  Constitutional: Positive for fatigue.  Respiratory: Negative.   Cardiovascular: Negative.   Gastrointestinal: Negative.   Neurological: Negative.        Objective:   Physical Exam Constitutional:      Appearance: Normal appearance.  Cardiovascular:     Rate and Rhythm: Regular rhythm. Tachycardia present.     Pulses: Normal pulses.     Heart sounds: Normal heart sounds.  Pulmonary:     Effort: Pulmonary effort is normal.     Breath sounds: Normal breath sounds.  Neurological:     General: No focal deficit present.     Mental Status: He is alert. Mental status is at baseline.           Assessment & Plan:  He is doing well now as far as his hydration status and his renal function. He will follow up with Dr. Lorrene Reid in Nephrology soon. His BP is up a bit today so we will increase the Clonidine patches to 0.2 mg per 24 hours. He will see Dr. Dwyane Dee tomorrow. We will refill his Tramadol for use for neuropathic pain.  Danny Penna, MD

## 2018-04-17 ENCOUNTER — Other Ambulatory Visit: Payer: Self-pay

## 2018-04-17 ENCOUNTER — Encounter: Payer: Self-pay | Admitting: Endocrinology

## 2018-04-17 ENCOUNTER — Ambulatory Visit (INDEPENDENT_AMBULATORY_CARE_PROVIDER_SITE_OTHER): Payer: Medicare Other | Admitting: Endocrinology

## 2018-04-17 VITALS — BP 150/92 | HR 101 | Ht 70.0 in | Wt 270.6 lb

## 2018-04-17 DIAGNOSIS — E1165 Type 2 diabetes mellitus with hyperglycemia: Secondary | ICD-10-CM | POA: Diagnosis not present

## 2018-04-17 DIAGNOSIS — Z794 Long term (current) use of insulin: Secondary | ICD-10-CM

## 2018-04-17 DIAGNOSIS — I70245 Atherosclerosis of native arteries of left leg with ulceration of other part of foot: Secondary | ICD-10-CM | POA: Diagnosis not present

## 2018-04-17 NOTE — Patient Instructions (Signed)
Lantus 60 am and pm so am sugar < 150  Carb coverage with shots divide by 5 plus correction doses  Novolog 3x daily

## 2018-04-18 ENCOUNTER — Other Ambulatory Visit: Payer: Self-pay

## 2018-05-02 ENCOUNTER — Telehealth: Payer: Self-pay | Admitting: Endocrinology

## 2018-05-02 ENCOUNTER — Telehealth: Payer: Self-pay | Admitting: *Deleted

## 2018-05-02 NOTE — Telephone Encounter (Signed)
Copied from [place] [id number]. Topic: General - Other >> May 02, 2018  9:44 AM Antonieta Iba C wrote: Reason for CRM: Loney Loh in company is calling in to check the status of the back brace Rx. If this could be signed, dated and faxed back as soon as possible. This Rx was faxed to the office yesterday.

## 2018-05-02 NOTE — Telephone Encounter (Signed)
I do not write for back braces. This should come from his orthopedist, Dr. Laurence Spates

## 2018-05-02 NOTE — Telephone Encounter (Signed)
I do not write orders for back braces. This would need to come from Dr. Laurence Spates, his orthopedist

## 2018-05-02 NOTE — Telephone Encounter (Signed)
Renee with [employer] ph# [phone] called re: status of a fax (insurance approval form-coverage determination form) that was sent to Dr. Dwyane Dee on 04/29/18. Please call Renee at the ph# listed above to advise.

## 2018-05-02 NOTE — Telephone Encounter (Signed)
Patient is aware 

## 2018-05-03 NOTE — Telephone Encounter (Signed)
[facility], and she was just inquiring as to whether or not the paperwork for coverage determination was received on 04/29/2018. She was informed that it was and it was faxed back to [facility] on 04/30/2018.

## 2018-05-10 ENCOUNTER — Other Ambulatory Visit: Payer: Self-pay

## 2018-05-10 ENCOUNTER — Other Ambulatory Visit (INDEPENDENT_AMBULATORY_CARE_PROVIDER_SITE_OTHER): Payer: Medicare Other

## 2018-05-10 DIAGNOSIS — E1165 Type 2 diabetes mellitus with hyperglycemia: Secondary | ICD-10-CM

## 2018-05-10 DIAGNOSIS — Z794 Long term (current) use of insulin: Secondary | ICD-10-CM

## 2018-05-10 LAB — BASIC METABOLIC PANEL
BUN: 22 mg/dL (ref 6–23)
CO2: 26 mEq/L (ref 19–32)
Calcium: 9.9 mg/dL (ref 8.4–10.5)
Chloride: 95 mEq/L — ABNORMAL LOW (ref 96–112)
Creatinine, Ser: 1.31 mg/dL (ref 0.40–1.50)
GFR: 73.03 mL/min (ref >60.00)
Glucose, Bld: 484 mg/dL — ABNORMAL HIGH (ref 70–99)
Potassium: 4.6 mEq/L (ref 3.5–5.1)
Sodium: 130 mEq/L — ABNORMAL LOW (ref 135–145)

## 2018-05-11 LAB — FRUCTOSAMINE: Fructosamine: 565 umol/L — ABNORMAL HIGH (ref 0–285)

## 2018-05-12 NOTE — Progress Notes (Signed)
This encounter was created in error - please disregard.

## 2018-05-13 ENCOUNTER — Other Ambulatory Visit: Payer: Self-pay

## 2018-05-13 ENCOUNTER — Encounter: Payer: Medicare Other | Admitting: Endocrinology

## 2018-05-14 ENCOUNTER — Ambulatory Visit: Payer: Medicare Other | Admitting: Podiatry

## 2018-05-23 ENCOUNTER — Telehealth: Payer: Self-pay | Admitting: *Deleted

## 2018-05-23 NOTE — Telephone Encounter (Signed)
Form was received and placed in the folder to be reviewed by Dr. Sarajane Jews

## 2018-05-23 NOTE — Telephone Encounter (Signed)
Copied from [place] [id number]. Topic: General - Inquiry >> May 23, 2018 10:00 AM Vernona Rieger wrote: Reason for CRM: [name] called to see if Dr Sarajane Jews received the fax they sent yesterday for a back brace for the patient. Please confirm with them @ [phone]

## 2018-05-29 ENCOUNTER — Telehealth: Payer: Self-pay | Admitting: Family Medicine

## 2018-05-29 NOTE — Telephone Encounter (Signed)
Called and spoke with [facility] [employer] and they are aware that Dr. Sarajane Jews will not approve the rx for the back brace.

## 2018-05-29 NOTE — Telephone Encounter (Signed)
Copied from [place] [id number]. Topic: General - Inquiry >> May 29, 2018 11:30 AM Richardo Priest, NT wrote: Reason for CRM: Cindy from [employer] called in and is requesting to know the status of the back brace from patient and wondering when that will be sent in due to patient sending request in in March. If needing clarification please call [phone].

## 2018-06-10 ENCOUNTER — Telehealth: Payer: Self-pay | Admitting: Endocrinology

## 2018-06-10 ENCOUNTER — Other Ambulatory Visit: Payer: Self-pay

## 2018-06-10 MED ORDER — INSULIN ASPART 100 UNIT/ML FLEXPEN: 20.0000 [IU] | PEN_INJECTOR | Freq: Three times a day (TID) | SUBCUTANEOUS | 3 refills | Status: DC

## 2018-06-10 NOTE — Telephone Encounter (Signed)
MEDICATION: Novolog Quick Pen (preloaded pen)  PHARMACY:  [facility]  On [address]  IS THIS A 90 DAY SUPPLY : no  IS PATIENT OUT OF MEDICATION: yes  IF NOT; HOW MUCH IS LEFT:   LAST APPOINTMENT DATE: @3 /19/2020  NEXT APPOINTMENT DATE:@Visit  date not found  DO WE HAVE YOUR PERMISSION TO LEAVE A DETAILED MESSAGE: yes  OTHER COMMENTS: patient would like samples if we have them.  States does not have pump yet to load with Novolog from vial   **Let patient know to contact pharmacy at the end of the day to make sure medication is ready. **  ** Please notify patient to allow 48-72 hours to process**  **Encourage patient to contact the pharmacy for refills or they can request refills through [facility]**

## 2018-06-10 NOTE — Telephone Encounter (Signed)
Rx sent 

## 2018-07-03 ENCOUNTER — Telehealth: Payer: Self-pay | Admitting: Endocrinology

## 2018-07-03 NOTE — Telephone Encounter (Signed)
Per [facility], "Caller states that he needs to know if his prescription for his omnipod dash has been filled."

## 2018-07-03 NOTE — Telephone Encounter (Signed)
Please advise 

## 2018-07-05 ENCOUNTER — Other Ambulatory Visit: Payer: Self-pay

## 2018-07-05 ENCOUNTER — Encounter: Payer: Self-pay | Admitting: Neurology

## 2018-07-05 ENCOUNTER — Telehealth (INDEPENDENT_AMBULATORY_CARE_PROVIDER_SITE_OTHER): Payer: Medicare Other | Admitting: Neurology

## 2018-07-05 VITALS — Ht 70.0 in | Wt 270.0 lb

## 2018-07-05 DIAGNOSIS — E1142 Type 2 diabetes mellitus with diabetic polyneuropathy: Secondary | ICD-10-CM

## 2018-07-05 MED ORDER — NORTRIPTYLINE HCL 10 MG PO CAPS: ORAL_CAPSULE | ORAL | 1 refills | Status: DC

## 2018-07-05 NOTE — Progress Notes (Signed)
Virtual Visit via Video Note The purpose of this virtual visit is to provide medical care while limiting exposure to the novel coronavirus.    Consent was obtained for video visit:  Yes.   Answered questions that patient had about telehealth interaction:  Yes.   I discussed the limitations, risks, security and privacy concerns of performing an evaluation and management service by telemedicine. I also discussed with the patient that there may be a patient responsible charge related to this service. The patient expressed understanding and agreed to proceed.  Pt location: Home Physician Location: office Name of referring provider:  [name], MD I connected with Danny Fuller at patients initiation/request on 07/05/2018 at  9:30 AM EDT by video enabled telemedicine application and verified that I am speaking with the correct person using two identifiers. Pt MRN:  [id number] Pt DOB:  Dec 22, 1975 Video Participants:  Danny Fuller   History of Present Illness: This is a 43 y.o. male returning for follow-up of painful diabetic neuropathy.  At his last visit, nortriptyline was increased to 40 mg at bedtime, which has improved the intensity of his pain.  He continues to take Lyrica 150 mg 3 times daily.  He continues to have numbness of the hands and feet, as well as weakness with grip.  He is very compliant with using a cane for imbalance and does report suffering a fall a month ago.  Overall, he feels that his neuropathy has not significantly progressed.  Unfortunately, his diabetes remains poorly controlled with last hemoglobin A1c of 11.6 in March 2020.  Again, he reports he is having difficulty with getting his pump to work as well as obtaining supplies.  He did have a pump mailed to him this week and is hoping to get the rest of the equipment soon to begin using it.  He continues to have weakness in the hands and numbness of the feet.   Observations/Objective:   Vitals:   07/05/18  0838  Weight: 270 lb (122.5 kg)  Height: 5\' 10"  (1.778 m)   Patient is awake, alert, and appears comfortable.  Oriented x 4.   Extraocular muscles are intact. No ptosis.  Face is symmetric.  Speech is not dysarthric. Tongue is midline. 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  He has good hand adherence with using his cane  Follow Up Instructions: I discussed the assessment and treatment plan with the patient. The patient was provided an opportunity to ask questions and all were answered. The patient agreed with the plan and demonstrated an understanding of the instructions.   The patient was advised to call back or seek an in-person evaluation if the symptoms worsen or if the condition fails to improve as anticipated.  Follow-up in 6 months    Taelynn Mcelhannon  Keith Rake, DO

## 2018-07-09 ENCOUNTER — Telehealth: Payer: Self-pay | Admitting: Nutrition

## 2018-07-09 NOTE — Telephone Encounter (Signed)
Message left with Renee at [facility] to call me back concerning the status of his insulin pump prescription for supplies for [facility]

## 2018-07-23 DIAGNOSIS — H5203 Hypermetropia, bilateral: Secondary | ICD-10-CM | POA: Diagnosis not present

## 2018-07-23 DIAGNOSIS — E119 Type 2 diabetes mellitus without complications: Secondary | ICD-10-CM | POA: Diagnosis not present

## 2018-07-23 DIAGNOSIS — E113293 Type 2 diabetes mellitus with mild nonproliferative diabetic retinopathy without macular edema, bilateral: Secondary | ICD-10-CM | POA: Diagnosis not present

## 2018-07-23 DIAGNOSIS — H524 Presbyopia: Secondary | ICD-10-CM | POA: Diagnosis not present

## 2018-07-23 DIAGNOSIS — H52221 Regular astigmatism, right eye: Secondary | ICD-10-CM | POA: Diagnosis not present

## 2018-07-24 ENCOUNTER — Other Ambulatory Visit: Payer: Self-pay

## 2018-07-29 ENCOUNTER — Other Ambulatory Visit: Payer: Self-pay

## 2018-07-29 MED ORDER — OMNIPOD DASH PODS (GEN 4) MISC: 1.0000 | 12 refills | Status: DC

## 2018-07-29 MED ORDER — GLUCOSE BLOOD VI STRP: ORAL_STRIP | 3 refills | Status: DC

## 2018-07-30 NOTE — Progress Notes (Signed)
Patient ID: Danny Fuller, male   DOB: Jun 07, 1975, 43 y.o.   MRN: [id number]            Reason for Appointment: Follow-up for Type 2 Diabetes and hypertension  Referring physician: Sarajane Jews   History of Present Illness:          Date of diagnosis of type 2 diabetes mellitus: 2007        Background history:   At diagnosis he was relatively asymptomatic and was started on metformin and glipizide He has been on the same oral hypoglycemic regimen for several years. Review of his A1c indicates it has been markedly increased persistently with the lowest reading in 2015 being 9.1  Recent history:   INSULIN regimen is: OMNIPOD pump since 01/05/17  BASAL rate midnight = 3.0, 6:30 AM = 6.0 and 6 p.m. = 4.5  Carbohydrate ratio 1: 25 at midnight and 30 at 6 PM, sensitivity 1: 10 between 6 AM and 6:30 PM and 1: 25 after 6:30 PM Targets 120 in the daytime and Active insulin 3 hours  Non-insulin hypoglycemic drugs the patient is taking are: Metformin ER 2000 mg daily, Jardiance 25 mg daily, Victoza 1.2 mg daily  His A1c is increased further at 12.8   Current management, blood sugar patterns and problems identified:  He has been back on the Omni pod insulin pump about a month ago  With this his blood sugars have been markedly better  He has been adjusting his settings on his own  After starting the pump his fasting blood sugars in the morning before getting low and he reduced his overnight basal rate by 50%  However his blood sugars are still on the low side between about 8 AM and 12 noon  Not clear why he has got her lower basal rate after 6 PM because his blood sugars are much higher after supper and at bedtime  Frequently will not bolus during the day partly because of eating low carbohydrate with blood sugars are mostly higher in the evenings only  Boluses: Occasionally may forget to bolus before eating but usually trying to do this before eating  He will try to count  carbohydrates using the calorie [employer] app  However and I do not he has changed his carbohydrate coverage which was previously 1: 10 and is getting only 1: 30 for his evening meal  More recently however he has been cutting back on carbohydrates and may only have 60 to 70 g of carbohydrate in the evenings and occasionally at lunch otherwise much less  Overall not able to do much exercise  His weight is about the same  Currently checking blood sugars about 5 times a day on an average        Side effects from medications have been: None  Compliance with the medical regimen: Fair  Glucose monitoring:  with freestyle meter on the Omnipod  Blood Glucose readings from pump download    PRE-MEAL Fasting Lunch Dinner Bedtime Overall  Glucose range:  61-138  61-165  70-271  196-374   Mean/median:  100  118  147  275  151   Previous readings:    PRE-MEAL Fasting Lunch Dinner Bedtime Overall  Glucose range:  55-244  64-301  142-326  147-346   Mean/median:  113  190  190  190  181     Self-care: Meal times are:  Breakfast is at 9-10 am, Lunch-1 PM : Dinner: 8 pm   Typical meal intake:  Breakfast is Usually toast and boiled eggs.  Usually getting cold cut sandwiches at lunch, grilled chicken and vegetables/potatoes at dinner.  Snacks are usually chips, crackers                Dietician visit, most recent: 05/02/16               Exercise:  some walking, limited by pain  Weight history: His highest weight in the past has been 378    Wt Readings from Last 3 Encounters:  07/31/18 267 lb 9.6 oz (121.4 kg)  07/05/18 270 lb (122.5 kg)  04/17/18 270 lb 9.6 oz (122.7 kg)    Glycemic control:   Lab Results  Component Value Date   HGBA1C 12.8 (A) 07/31/2018   HGBA1C 11.6 (H) 04/15/2018   HGBA1C 10.6 (H) 12/27/2017   Lab Results  Component Value Date   MICROALBUR 100.9 (H) 04/15/2018   LDLCALC 51 03/23/2016   CREATININE 1.31 05/10/2018   Lab Results  Component Value Date    MICRALBCREAT 124.0 (H) 04/15/2018    Lab Results  Component Value Date   FRUCTOSAMINE 565 (H) 05/10/2018   FRUCTOSAMINE 437 (H) 04/15/2018   FRUCTOSAMINE 570 (H) 10/16/2017      Allergies as of 07/31/2018      Reactions   Vancomycin Other (See Comments)   AKI      Medication List       Accurate as of July 31, 2018 11:59 AM. If you have any questions, ask your nurse or doctor.        benazepril 40 MG tablet Commonly known as: LOTENSIN Take 1 tablet (40 mg total) by mouth daily.   cloNIDine 0.2 mg/24hr patch Commonly known as: Catapres-TTS-2 Place 1 patch (0.2 mg total) onto the skin once a week.   empagliflozin 25 MG Tabs tablet Commonly known as: Jardiance Take 25 mg by mouth daily.   Fifty50 Pen Needles 31G X 5 MM Misc Generic drug: Insulin Pen Needle Use to inject insulin once daily   glucose blood test strip Use Bayer Contour Next Test strips as instructed to check blood sugar 5 times daily.   insulin aspart 100 UNIT/ML FlexPen Commonly known as: NovoLOG FlexPen Inject 20-24 Units into the skin 3 (three) times daily with meals. Inject 20-24 units under the skin three times daily before meals.   insulin glargine 100 UNIT/ML injection Commonly known as: LANTUS Inject 60 Units into the skin 2 (two) times daily.   labetalol 100 MG tablet Commonly known as: NORMODYNE Take 1 tablet (100 mg total) by mouth 2 (two) times daily. TAKE 1 TABLET BY MOUTH TWICE DAILY.   Lyrica 150 MG capsule Generic drug: pregabalin Take 1 capsule (150 mg total) by mouth 3 (three) times daily.   nortriptyline 10 MG capsule Commonly known as: PAMELOR TAKE 4 CAPSULES BY MOUTH AT BEDTIME   OmniPod Dash 5 Pack Pods Misc Inject 1 each into the skin continuous. Apply 1 OmniPod insulin pump to body daily for insulin delivery.   pravastatin 40 MG tablet Commonly known as: PRAVACHOL Take 1 tablet (40 mg total) by mouth every evening.   tadalafil 20 MG tablet Commonly known as:  CIALIS Take 1 tablet (20 mg total) by mouth daily as needed for erectile dysfunction.   traMADol 50 MG tablet Commonly known as: ULTRAM Take 2 tablets (100 mg total) by mouth every 6 (six) hours as needed for moderate pain.   Victoza 18 MG/3ML Sopn Generic drug: liraglutide Inject 0.3  mLs (1.8 mg total) into the skin daily. Inject once daily at the same time       Allergies:  Allergies  Allergen Reactions  . Vancomycin Other (See Comments)    AKI    Past Medical History:  Diagnosis Date  . Asthma   . Back pain    Bulging Disk  . Chronic kidney disease    sees Dr. Jamal Maes    . Diabetes mellitus    sees Dr. Dwyane Dee   . GERD (gastroesophageal reflux disease)   . Headache(784.0)   . Hyperlipidemia   . Hypertension   . Sleep apnea    CPAP    Past Surgical History:  Procedure Laterality Date  . AMPUTATION Left 07/26/2015   Procedure: AMPUTATION LEFT FIFTH RAY;  Surgeon: [name], MD;  Location: [facility];  Service: Orthopedics;  Laterality: Left;  . bilateral hip pins placed    . INCISION AND DRAINAGE PERIRECTAL ABSCESS Right 03/07/2016   Procedure: IRRIGATION AND DEBRIDEMENT PERIRECTAL ABSCESS;  Surgeon: Alphonsa Overall, MD;  Location: [facility];  Service: General;  Laterality: Right;  . INCISION AND DRAINAGE PERIRECTAL ABSCESS Right 03/09/2016   Procedure: EXAM UNDER ANESTHESIA, IRRIGATION AND DEBRIDEMENT PERIRECTAL ABSCESS;  Surgeon: Johnathan Hausen, MD;  Location: [facility];  Service: General;  Laterality: Right;  Open, betadine packed wound    Family History  Problem Relation Age of Onset  . Arthritis Other   . Diabetes Other   . Hypertension Other   . Hyperlipidemia Other   . Stroke Other   . Sudden death Other   . Diabetes Mother   . Diabetes Sister   . Diabetes Brother   . Heart attack Father        Died ate 55, couple of "heart attacks"     Social History:  reports that he quit smoking about 3 years ago. His smoking use included cigarettes. He has a 10.00  pack-year smoking history. He has never used smokeless tobacco. He reports that he does not drink alcohol or use drugs.   Review of Systems   Lipid history: LDL has been below 100, mild increase in triglycerides present Has been started on pravastatin by cardiologist    Lab Results  Component Value Date   CHOL 144 07/12/2017   HDL 28.90 (L) 07/12/2017   LDLCALC 51 03/23/2016   LDLDIRECT 89.0 07/12/2017   TRIG 201.0 (H) 07/12/2017   CHOLHDL 5 07/12/2017           Hypertension: On treatment for several years  Currently he is taking benazepril  along with the 0.2 mg clonidine patch and on labetalol 200 mg twice daily Also seen by nephrologist  In the past weekend he felt his blood pressure to be low and felt sweaty and reportedly his blood pressure was in the 63J systolic which is unusual and probably lower since he improved his diet He stopped his clonidine patch but has not checked his blood pressure in the last 2 days  Blood pressure is high in the office today and measurement on the second occasion was 160/100   BP Readings from Last 3 Encounters:  07/31/18 (!) 142/86  04/17/18 (!) 150/92  04/16/18 (!) 158/98    EDEMA: He has history of previous leg edema However this is mostly on the left leg when he is up and about  He is not taking Lasix, previously on 60 mg for leg edema, No recent renal function available   Lab Results  Component Value  Date   CREATININE 1.31 05/10/2018   BUN 22 05/10/2018   NA 130 (L) 05/10/2018   K 4.6 05/10/2018   CL 95 (L) 05/10/2018   CO2 26 05/10/2018    Neuropathy: He has sharp pains in his feet from neuropathy,  on Lyrica without adequate control of his pain and numbness Followed by neurologist Also on nortriptyline at bedtime, now taking 40 mg and asking about simplifying his regimen Duloxetine may not have helped previously   He was recommended diabetic shoes by podiatrist   LABS:  Office Visit on 07/31/2018  Component  Date Value Ref Range Status  . Hemoglobin A1C 07/31/2018 12.8* 4.0 - 5.6 % Final    Physical Examination:  BP (!) 142/86 (BP Location: Left Arm, Patient Position: Sitting, Cuff Size: Large)   Pulse 96   Ht 5\' 10"  (1.778 m)   Wt 267 lb 9.6 oz (121.4 kg)   SpO2 97%   BMI 38.40 kg/m   1+ left lower leg edema  ASSESSMENT:  Diabetes type 2, uncontrolled, insulin-dependent   See history of present illness for detailed discussion of current diabetes management, blood sugar patterns and problems identified  His A1c has gone up to 12.8, previously 11.5  His blood sugars are much more difficult to control when he is not using his insulin pump However previously would not do as much basal insulin also  More recently blood sugars are excellent but usually significantly high in the evenings when he is having a lower basal rate after 6 PM as well as not getting enough coverage for his meals Although he has tried to adjust his settings on his pump on his own he has not been appropriately making adjustments for both his basal and bolus settings as was discussed in detail today  Generally trying to do well with diet and cutting back on carbohydrates which is also helping Has not lost weight as yet Has limited ability to exercise   HYPERTENSION: Blood pressure appears difficult to regulate With his dietary changes and likely low-sodium diet his blood pressure appears much lower by history However with completely stopping his clonidine his blood pressure appears to be going up higher today; does also have mild whitecoat increase in blood pressure when checked second time   PLAN:     Needs to lower his basal rate slightly in the daytime and increase it in the evening as follows: Basal rate between 6:30 AM and midnight = 5.5  Carbohydrate coverage 1: 25 throughout the day  Sensitivity 1: 20 throughout the day  Blood sugar correction threshold during the day 130  Reminded to take  boluses consistently before eating  Discussed benefits of using the freestyle libre system which will help him and he is already monitoring numerous times a day.  He will be given the list of the suppliers to get the paperwork started  For his HYPERTENSION he will start on the lower dose of 1 mg clonidine patch and check blood pressure daily  Chemistry panel and lipids will be checked today  Check fructosamine on the next visit    There are no Patient Instructions on file for this visit.   Counseling time on subjects discussed in assessment and plan sections is over 50% of today's 25 minute visit  [name] 07/31/2018, 11:59 AM   Note: This office note was prepared with Dragon voice recognition system technology. Any transcriptional errors that result from this process are unintentional.

## 2018-07-31 ENCOUNTER — Ambulatory Visit (INDEPENDENT_AMBULATORY_CARE_PROVIDER_SITE_OTHER): Payer: Medicare Other | Admitting: Endocrinology

## 2018-07-31 ENCOUNTER — Encounter: Payer: Self-pay | Admitting: Endocrinology

## 2018-07-31 ENCOUNTER — Other Ambulatory Visit: Payer: Self-pay

## 2018-07-31 VITALS — BP 142/86 | HR 96 | Ht 70.0 in | Wt 267.6 lb

## 2018-07-31 DIAGNOSIS — I70245 Atherosclerosis of native arteries of left leg with ulceration of other part of foot: Secondary | ICD-10-CM | POA: Diagnosis not present

## 2018-07-31 DIAGNOSIS — E7800 Pure hypercholesterolemia, unspecified: Secondary | ICD-10-CM

## 2018-07-31 DIAGNOSIS — I10 Essential (primary) hypertension: Secondary | ICD-10-CM | POA: Diagnosis not present

## 2018-07-31 DIAGNOSIS — E1165 Type 2 diabetes mellitus with hyperglycemia: Secondary | ICD-10-CM

## 2018-07-31 DIAGNOSIS — Z794 Long term (current) use of insulin: Secondary | ICD-10-CM | POA: Diagnosis not present

## 2018-07-31 LAB — LIPID PANEL
Cholesterol: 136 mg/dL (ref 0–200)
HDL: 33.3 mg/dL — ABNORMAL LOW (ref >39.00)
LDL Cholesterol: 78 mg/dL (ref 0–99)
NonHDL: 102.76
Total CHOL/HDL Ratio: 4
Triglycerides: 124 mg/dL (ref 0.0–149.0)
VLDL: 24.8 mg/dL (ref 0.0–40.0)

## 2018-07-31 LAB — COMPREHENSIVE METABOLIC PANEL
ALT: 16 U/L (ref 0–53)
AST: 17 U/L (ref 0–37)
Albumin: 3.7 g/dL (ref 3.5–5.2)
Alkaline Phosphatase: 63 U/L (ref 39–117)
BUN: 16 mg/dL (ref 6–23)
CO2: 26 mEq/L (ref 19–32)
Calcium: 9.3 mg/dL (ref 8.4–10.5)
Chloride: 103 mEq/L (ref 96–112)
Creatinine, Ser: 1.28 mg/dL (ref 0.40–1.50)
GFR: 74.92 mL/min (ref >60.00)
Glucose, Bld: 167 mg/dL — ABNORMAL HIGH (ref 70–99)
Potassium: 4.2 mEq/L (ref 3.5–5.1)
Sodium: 137 mEq/L (ref 135–145)
Total Bilirubin: 0.4 mg/dL (ref 0.2–1.2)
Total Protein: 6.8 g/dL (ref 6.0–8.3)

## 2018-07-31 LAB — POCT GLYCOSYLATED HEMOGLOBIN (HGB A1C): Hemoglobin A1C: 12.8 % — AB (ref 4.0–5.6)

## 2018-07-31 MED ORDER — CLONIDINE 0.1 MG/24HR TD PTWK: 0.1000 mg | MEDICATED_PATCH | TRANSDERMAL | 2 refills | Status: DC

## 2018-08-02 ENCOUNTER — Ambulatory Visit: Payer: Medicaid Other | Admitting: Neurology

## 2018-08-07 ENCOUNTER — Other Ambulatory Visit: Payer: Self-pay

## 2018-08-07 ENCOUNTER — Encounter: Payer: Self-pay | Admitting: Podiatry

## 2018-08-07 ENCOUNTER — Ambulatory Visit (INDEPENDENT_AMBULATORY_CARE_PROVIDER_SITE_OTHER): Payer: Medicare Other | Admitting: Podiatry

## 2018-08-07 DIAGNOSIS — L84 Corns and callosities: Secondary | ICD-10-CM | POA: Insufficient documentation

## 2018-08-07 DIAGNOSIS — Z89432 Acquired absence of left foot: Secondary | ICD-10-CM

## 2018-08-07 DIAGNOSIS — B351 Tinea unguium: Secondary | ICD-10-CM | POA: Diagnosis not present

## 2018-08-07 DIAGNOSIS — M79675 Pain in left toe(s): Secondary | ICD-10-CM | POA: Diagnosis not present

## 2018-08-07 DIAGNOSIS — M79674 Pain in right toe(s): Secondary | ICD-10-CM | POA: Diagnosis not present

## 2018-08-07 NOTE — Progress Notes (Signed)
This patient presents to the office for preventative foot care services.  He was seen by Dr.  Amalia Hailey for an ulcer under big toe joint left foot.  He Evans recorded the ulcer has resolved and is coming into the office today for preventative foot care services.  He says he had difficulty making an appointment and the callus under big toe joint had thickened.  He says his wife trimmed the thickness of the callus between visits.  He denies any drainage from the left forefoot.  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  General Appearance  Alert, conversant and in no acute stress.  Vascular  Dorsalis pedis and posterior tibial  pulses are palpable  bilaterally.  Capillary return is within normal limits  bilaterally. Temperature is within normal limits  bilaterally.  Neurologic  Senn-Weinstein monofilament wire test diminished   bilaterally. Muscle power within normal limits bilaterally.  Nails Thick disfigured discolored nails with subungual debris  from hallux to fifth toes right foot.  Debridement of nails 1-3 left foot.. No evidence of bacterial infection or drainage bilaterally.  Orthopedic  No limitations of motion  feet .  No crepitus or effusions noted.  No bony pathology or digital deformities noted. Amputation lateral forefoot.  Skin  normotropic skin with no porokeratosis noted bilaterally.  No signs of infections or ulcers noted.  Pre-ulcerous callus noted measuring 20 mm. X 20 mm.  No infection or drainage.   Onychomycosis  X 8.  Pre-ulcerous callus sub 1 left foot.  Debride nails  X 8.  Debride pre-ulcerous callus left foot.  Discussed this condition with this patient.  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  Patient was told to schedule an appointment with Liliane Channel for his evaluation nand treatment.   Gardiner Barefoot DPM

## 2018-08-12 ENCOUNTER — Telehealth: Payer: Self-pay | Admitting: Endocrinology

## 2018-08-12 NOTE — Telephone Encounter (Signed)
Calling to see if we received a fax for a [employer] order. This has been faxed 3x now.  Basilia Jumbo is faxing again to [phone]  Will send to Olen Cordial to keep an eye out

## 2018-08-14 NOTE — Telephone Encounter (Signed)
This has been filled out and will be faxed today.

## 2018-08-19 ENCOUNTER — Other Ambulatory Visit: Payer: Medicare Other | Admitting: Orthotics

## 2018-08-27 NOTE — Telephone Encounter (Signed)
Opened in error

## 2018-09-30 ENCOUNTER — Other Ambulatory Visit: Payer: Self-pay

## 2018-09-30 ENCOUNTER — Other Ambulatory Visit (INDEPENDENT_AMBULATORY_CARE_PROVIDER_SITE_OTHER): Payer: Medicare Other

## 2018-09-30 DIAGNOSIS — E1165 Type 2 diabetes mellitus with hyperglycemia: Secondary | ICD-10-CM

## 2018-09-30 DIAGNOSIS — Z794 Long term (current) use of insulin: Secondary | ICD-10-CM

## 2018-09-30 LAB — BASIC METABOLIC PANEL
BUN: 16 mg/dL (ref 6–23)
CO2: 26 mEq/L (ref 19–32)
Calcium: 9.2 mg/dL (ref 8.4–10.5)
Chloride: 104 mEq/L (ref 96–112)
Creatinine, Ser: 1.04 mg/dL (ref 0.40–1.50)
GFR: 95.13 mL/min (ref >60.00)
Glucose, Bld: 83 mg/dL (ref 70–99)
Potassium: 4 mEq/L (ref 3.5–5.1)
Sodium: 137 mEq/L (ref 135–145)

## 2018-10-01 LAB — FRUCTOSAMINE: Fructosamine: 284 umol/L (ref 0–285)

## 2018-10-02 ENCOUNTER — Ambulatory Visit: Payer: Medicare Other | Admitting: Endocrinology

## 2018-10-04 ENCOUNTER — Other Ambulatory Visit: Payer: Self-pay

## 2018-10-04 ENCOUNTER — Encounter: Payer: Self-pay | Admitting: Endocrinology

## 2018-10-04 ENCOUNTER — Ambulatory Visit (INDEPENDENT_AMBULATORY_CARE_PROVIDER_SITE_OTHER): Payer: Medicare Other | Admitting: Endocrinology

## 2018-10-04 VITALS — BP 150/72 | HR 95 | Ht 70.0 in | Wt 267.4 lb

## 2018-10-04 DIAGNOSIS — Z794 Long term (current) use of insulin: Secondary | ICD-10-CM | POA: Diagnosis not present

## 2018-10-04 DIAGNOSIS — E1165 Type 2 diabetes mellitus with hyperglycemia: Secondary | ICD-10-CM

## 2018-10-04 DIAGNOSIS — I70245 Atherosclerosis of native arteries of left leg with ulceration of other part of foot: Secondary | ICD-10-CM

## 2018-10-04 DIAGNOSIS — I10 Essential (primary) hypertension: Secondary | ICD-10-CM | POA: Diagnosis not present

## 2018-10-04 DIAGNOSIS — E7800 Pure hypercholesterolemia, unspecified: Secondary | ICD-10-CM

## 2018-10-04 NOTE — Progress Notes (Signed)
Patient ID: Danny Fuller, male   DOB: 08/27/1975, 43 y.o.   MRN: [id number]            Reason for Appointment: Follow-up for Type 2 Diabetes and hypertension  Referring physician: Sarajane Jews   History of Present Illness:          Date of diagnosis of type 2 diabetes mellitus: 2007        Background history:   At diagnosis he was relatively asymptomatic and was started on metformin and glipizide He has been on the same oral hypoglycemic regimen for several years. Review of his A1c indicates it has been markedly increased persistently with the lowest reading in 2015 being 9.1  Recent history:   INSULIN regimen is: OMNIPOD pump since 01/05/17  BASAL rate midnight = 3.0, 6:30 AM = 6.0  Carbohydrate ratio 1: 30 at midnight, 15 at 6 AM and 30 at 6 PM , sensitivity 1: 15 at midnight, 1: 20 between 6 AM and 6:30 PM and 1: 28 after 6:30 PM Targets 120 in the daytime, 1 6:40 PM and 100 at midnight Active insulin 3 hours  Non-insulin hypoglycemic drugs the patient is taking are: Metformin ER 2000 mg daily, Jardiance 25 mg daily, Victoza 1.2 mg daily  His A1c previously was 12.8 Fructosamine is now 284  Pump programming changes recommended on the last visit:    Basal rate between 6:30 AM and midnight = 5.5  Carbohydrate coverage 1: 25 throughout the day  Sensitivity 1: 20 throughout the day  Blood sugar correction threshold during the day 130   Current management, blood sugar patterns and problems identified:  He still has not followed instructions for programming his settings in the pump as discussed on the last visit and has his own settings for both basal and bolus  Blood sugar monitoring has been somewhat erratic  He does not understand the need to bolus consistently at the time after meals regardless of whether he has checked his blood sugar or not.  Also frequently will forget to bolus until after eating when the blood sugar goes up  Not always making correction for high  sugars also  Periodically will forget to enter his blood sugars in the pump at the time of bolus also  HYPERGLYCEMIA is mostly in the evenings and late afternoons based on his compliance with his boluses  He thinks he is getting low blood sugars overnight sometimes but he has not reduce his basal rate at night  Also he is getting inadequate carbohydrate coverage for his evening meals with a ratio 1: 30 compared to 1: 15 during the day  Blood sugar targets also have been set higher at 6 PM  He should be able to get the freestyle libre but is waiting for his prescription to be filled after his test trips run out  As before he is not able to exercise much  His weight is about the same  He thinks he is compliant with his Victoza and Jardiance        Side effects from medications have been: None  Compliance with the medical regimen: Fair  Glucose monitoring:  with freestyle meter on the Omnipod  Blood Glucose readings from pump and monitor download   PRE-MEAL Fasting Lunch Dinner Bedtime Overall  Glucose range:  63-171  80-302  104-408  125-321   Mean/median:     204  185   Previous readings:  PRE-MEAL Fasting Lunch Dinner Bedtime Overall  Glucose range:  61-138  61-165  70-271  196-374   Mean/median:  100  118  147  275  151     Self-care: Meal times are:  Breakfast is at 9-10 am, Lunch-1 PM : Dinner: 8 pm   Typical meal intake: Breakfast is Usually toast and boiled eggs.  Usually getting cold cut sandwiches at lunch, grilled chicken and vegetables/potatoes at dinner.  Snacks are usually chips, crackers                Dietician visit, most recent: 05/02/16               Exercise:  some walking, limited by pain  Weight history: His highest weight in the past has been 378    Wt Readings from Last 3 Encounters:  10/04/18 267 lb 6.4 oz (121.3 kg)  07/31/18 267 lb 9.6 oz (121.4 kg)  07/05/18 270 lb (122.5 kg)    Glycemic control:   Lab Results  Component Value  Date   HGBA1C 12.8 (A) 07/31/2018   HGBA1C 11.6 (H) 04/15/2018   HGBA1C 10.6 (H) 12/27/2017   Lab Results  Component Value Date   MICROALBUR 100.9 (H) 04/15/2018   LDLCALC 78 07/31/2018   CREATININE 1.04 09/30/2018   Lab Results  Component Value Date   MICRALBCREAT 124.0 (H) 04/15/2018    Lab Results  Component Value Date   FRUCTOSAMINE 284 09/30/2018   FRUCTOSAMINE 565 (H) 05/10/2018   FRUCTOSAMINE 437 (H) 04/15/2018      Allergies as of 10/04/2018      Reactions   Vancomycin Other (See Comments)   AKI      Medication List       Accurate as of October 04, 2018 11:59 PM. If you have any questions, ask your nurse or doctor.        STOP taking these medications   Victoza 18 MG/3ML Sopn Generic drug: liraglutide Stopped by: [name], MD     TAKE these medications   benazepril 40 MG tablet Commonly known as: LOTENSIN Take 1 tablet (40 mg total) by mouth daily.   cloNIDine 0.1 mg/24hr patch Commonly known as: Catapres-TTS-2 Place 1 patch (0.1 mg total) onto the skin once a week.   empagliflozin 25 MG Tabs tablet Commonly known as: Jardiance Take 25 mg by mouth daily.   Fifty50 Pen Needles 31G X 5 MM Misc Generic drug: Insulin Pen Needle Use to inject insulin once daily   glucose blood test strip Use Bayer Contour Next Test strips as instructed to check blood sugar 5 times daily.   insulin aspart 100 UNIT/ML FlexPen Commonly known as: NovoLOG FlexPen Inject 20-24 Units into the skin 3 (three) times daily with meals. Inject 20-24 units under the skin three times daily before meals.   insulin glargine 100 UNIT/ML injection Commonly known as: LANTUS Inject 60 Units into the skin 2 (two) times daily.   labetalol 100 MG tablet Commonly known as: NORMODYNE Take 1 tablet (100 mg total) by mouth 2 (two) times daily. TAKE 1 TABLET BY MOUTH TWICE DAILY.   Lyrica 150 MG capsule Generic drug: pregabalin Take 1 capsule (150 mg total) by mouth 3 (three) times  daily.   nortriptyline 10 MG capsule Commonly known as: PAMELOR TAKE 4 CAPSULES BY MOUTH AT BEDTIME   OmniPod Dash 5 Pack Pods Misc Inject 1 each into the skin continuous. Apply 1 OmniPod insulin pump to body daily for insulin delivery.   pravastatin 40 MG tablet Commonly known as: PRAVACHOL Take  1 tablet (40 mg total) by mouth every evening.   tadalafil 20 MG tablet Commonly known as: CIALIS Take 1 tablet (20 mg total) by mouth daily as needed for erectile dysfunction.   traMADol 50 MG tablet Commonly known as: ULTRAM Take 2 tablets (100 mg total) by mouth every 6 (six) hours as needed for moderate pain.       Allergies:  Allergies  Allergen Reactions  . Vancomycin Other (See Comments)    AKI    Past Medical History:  Diagnosis Date  . Asthma   . Back pain    Bulging Disk  . Chronic kidney disease    sees Dr. Jamal Maes    . Diabetes mellitus    sees Dr. Dwyane Dee   . GERD (gastroesophageal reflux disease)   . Headache(784.0)   . Hyperlipidemia   . Hypertension   . Sleep apnea    CPAP    Past Surgical History:  Procedure Laterality Date  . AMPUTATION Left 07/26/2015   Procedure: AMPUTATION LEFT FIFTH RAY;  Surgeon: [name], MD;  Location: [place];  Service: Orthopedics;  Laterality: Left;  . bilateral hip pins placed    . INCISION AND DRAINAGE PERIRECTAL ABSCESS Right 03/07/2016   Procedure: IRRIGATION AND DEBRIDEMENT PERIRECTAL ABSCESS;  Surgeon: Alphonsa Overall, MD;  Location: [facility];  Service: General;  Laterality: Right;  . INCISION AND DRAINAGE PERIRECTAL ABSCESS Right 03/09/2016   Procedure: EXAM UNDER ANESTHESIA, IRRIGATION AND DEBRIDEMENT PERIRECTAL ABSCESS;  Surgeon: Johnathan Hausen, MD;  Location: [facility];  Service: General;  Laterality: Right;  Open, betadine packed wound    Family History  Problem Relation Age of Onset  . Arthritis Other   . Diabetes Other   . Hypertension Other   . Hyperlipidemia Other   . Stroke Other   . Sudden death Other    . Diabetes Mother   . Diabetes Sister   . Diabetes Brother   . Heart attack Father        Died ate 2, couple of "heart attacks"     Social History:  reports that he quit smoking about 3 years ago. His smoking use included cigarettes. He has a 10.00 pack-year smoking history. He has never used smokeless tobacco. He reports that he does not drink alcohol or use drugs.   Review of Systems   Lipid history: LDL has been below 100, mild increase in triglycerides present Has been started on pravastatin by cardiologist    Lab Results  Component Value Date   CHOL 136 07/31/2018   HDL 33.30 (L) 07/31/2018   LDLCALC 78 07/31/2018   LDLDIRECT 89.0 07/12/2017   TRIG 124.0 07/31/2018   CHOLHDL 4 07/31/2018           Hypertension: On treatment for several years  Currently he is taking benazepril 40 mg along with the 0.1 mg clonidine patch and on labetalol 100 mg twice daily Also seen by nephrologist On his last visit he was not using his clonidine patch and this was resumed at the lower dose  Home BP 139/84 recently and tends to be higher in the office   BP Readings from Last 3 Encounters:  10/04/18 (!) 150/72  07/31/18 (!) 142/86  04/17/18 (!) 150/92    EDEMA: He has history of previous leg edema However this is mostly on the left leg and more in the evening  He is not taking Lasix, previously on 60 mg daily    Lab Results  Component Value Date  CREATININE 1.04 09/30/2018   BUN 16 09/30/2018   NA 137 09/30/2018   K 4.0 09/30/2018   CL 104 09/30/2018   CO2 26 09/30/2018    Neuropathy: He has sharp pains in his feet from neuropathy,  on Lyrica 150 mg without adequate control of his pain and numbness Followed by neurologist Also on nortriptyline at bedtime, now taking 40 mg and asking about simplifying his regimen Duloxetine may not have helped previously   He was recommended diabetic shoes by podiatrist   LABS:  Lab on 09/30/2018  Component Date Value Ref  Range Status  . Fructosamine 09/30/2018 284  0 - 285 umol/L Final   Comment: Published reference interval for apparently healthy subjects between age 55 and 55 is 68 - 285 umol/L and in a poorly controlled diabetic population is 228 - 563 umol/L with a mean of 396 umol/L.   Marland Kitchen Sodium 09/30/2018 137  135 - 145 mEq/L Final  . Potassium 09/30/2018 4.0  3.5 - 5.1 mEq/L Final  . Chloride 09/30/2018 104  96 - 112 mEq/L Final  . CO2 09/30/2018 26  19 - 32 mEq/L Final  . Glucose, Bld 09/30/2018 83  70 - 99 mg/dL Final  . BUN 09/30/2018 16  6 - 23 mg/dL Final  . Creatinine, Ser 09/30/2018 1.04  0.40 - 1.50 mg/dL Final  . Calcium 09/30/2018 9.2  8.4 - 10.5 mg/dL Final  . GFR 09/30/2018 95.13  >60.00 mL/min Final    Physical Examination:  BP (!) 150/72 (BP Location: Left Arm, Patient Position: Sitting, Cuff Size: Large)   Pulse 95   Ht 5\' 10"  (1.778 m)   Wt 267 lb 6.4 oz (121.3 kg)   SpO2 98%   BMI 38.37 kg/m    ASSESSMENT:  Diabetes type 2, uncontrolled, insulin-dependent   See history of present illness for detailed discussion of current diabetes management, blood sugar patterns and problems identified  His A1c was last 12.8  Fructosamine of 284 indicates much better control  His blood sugar control can be better if he is following instructions for his boluses for meals and getting adequate coverage especially in the evening He keeps changing his settings on his own and bolus settings are inadequate for carbohydrate coverage and correction in the evenings He is forgetting to bolus before the meals and does not understand need to bolus regardless of checking his blood sugar and also using combination of carbohydrate coverage and correction factor when blood sugars are high  Overnight blood sugars may be relatively low Has not lost any weight Discussed his day-to-day management, shortcomings and how to improve his control He would likely have better control when he has a freestyle  libre and able to see his blood sugar patterns and improve postprandial control   HYPERTENSION: Blood pressure appears improved especially at home Currently on benazepril, clonidine and labetalol Also on Jardiance  PLAN:     Needs to lower his basal rate overnight down to 2.7  Go back to 1: 15 carbohydrate coverage at 6 PM and 1: 20 correction factor  Blood sugar target 1 6:20 PM  Bolus consistently as discussed in detail today  Consider adding a diuretic for his hypertension, we will also have him follow-up with his nephrologist and PCP  Start using the freestyle libre when available    Patient Instructions  Basal at midnight 2.7 till 6.30 am  Bolus at all meals even if no sugar level     Counseling time on subjects discussed  in assessment and plan sections is over 50% of today's 25 minute visit  [name] 10/05/2018, 6:10 PM   Note: This office note was prepared with Dragon voice recognition system technology. Any transcriptional errors that result from this process are unintentional.

## 2018-10-04 NOTE — Patient Instructions (Signed)
Basal at midnight 2.7 till 6.30 am  Bolus at all meals even if no sugar level

## 2018-10-15 ENCOUNTER — Telehealth: Payer: Self-pay | Admitting: Endocrinology

## 2018-10-15 NOTE — Telephone Encounter (Signed)
Danny Fuller with [facility] ph# [phone] called re: Supplies for the [facility] have been shipped to the patient.

## 2018-10-17 ENCOUNTER — Encounter: Payer: Self-pay | Admitting: Podiatry

## 2018-10-18 ENCOUNTER — Telehealth: Payer: Self-pay | Admitting: Pulmonary Disease

## 2018-10-18 NOTE — Telephone Encounter (Signed)
Spoke with a rep at [employer]. Advised her that we have not seen the pt in over a year. We can't reorder his CPAP supplies at this time.

## 2018-10-24 DIAGNOSIS — H40013 Open angle with borderline findings, low risk, bilateral: Secondary | ICD-10-CM | POA: Diagnosis not present

## 2018-10-31 ENCOUNTER — Other Ambulatory Visit: Payer: Self-pay | Admitting: Endocrinology

## 2018-11-04 ENCOUNTER — Ambulatory Visit (INDEPENDENT_AMBULATORY_CARE_PROVIDER_SITE_OTHER): Payer: Medicare Other | Admitting: Family Medicine

## 2018-11-04 ENCOUNTER — Other Ambulatory Visit: Payer: Self-pay

## 2018-11-04 ENCOUNTER — Encounter: Payer: Self-pay | Admitting: Family Medicine

## 2018-11-04 VITALS — BP 170/90 | HR 94 | Temp 97.1°F | Ht 70.0 in | Wt 270.4 lb

## 2018-11-04 DIAGNOSIS — I10 Essential (primary) hypertension: Secondary | ICD-10-CM | POA: Diagnosis not present

## 2018-11-04 DIAGNOSIS — M79671 Pain in right foot: Secondary | ICD-10-CM | POA: Diagnosis not present

## 2018-11-04 DIAGNOSIS — G63 Polyneuropathy in diseases classified elsewhere: Secondary | ICD-10-CM

## 2018-11-04 DIAGNOSIS — M79672 Pain in left foot: Secondary | ICD-10-CM

## 2018-11-04 DIAGNOSIS — I70245 Atherosclerosis of native arteries of left leg with ulceration of other part of foot: Secondary | ICD-10-CM

## 2018-11-04 DIAGNOSIS — E1165 Type 2 diabetes mellitus with hyperglycemia: Secondary | ICD-10-CM | POA: Diagnosis not present

## 2018-11-04 DIAGNOSIS — M5441 Lumbago with sciatica, right side: Secondary | ICD-10-CM

## 2018-11-04 DIAGNOSIS — Z23 Encounter for immunization: Secondary | ICD-10-CM | POA: Diagnosis not present

## 2018-11-04 DIAGNOSIS — G8929 Other chronic pain: Secondary | ICD-10-CM | POA: Diagnosis not present

## 2018-11-04 DIAGNOSIS — M5442 Lumbago with sciatica, left side: Secondary | ICD-10-CM

## 2018-11-04 DIAGNOSIS — Z794 Long term (current) use of insulin: Secondary | ICD-10-CM

## 2018-11-04 DIAGNOSIS — I739 Peripheral vascular disease, unspecified: Secondary | ICD-10-CM

## 2018-11-04 LAB — MICROALBUMIN / CREATININE URINE RATIO
Creatinine,U: 169.7 mg/dL
Microalb Creat Ratio: 119.8 mg/g — ABNORMAL HIGH (ref 0.0–30.0)
Microalb, Ur: 203.3 mg/dL — ABNORMAL HIGH (ref 0.0–1.9)

## 2018-11-04 LAB — COMPREHENSIVE METABOLIC PANEL
ALT: 22 U/L (ref 0–53)
AST: 22 U/L (ref 0–37)
Albumin: 4.2 g/dL (ref 3.5–5.2)
Alkaline Phosphatase: 80 U/L (ref 39–117)
BUN: 17 mg/dL (ref 6–23)
CO2: 27 mEq/L (ref 19–32)
Calcium: 9.6 mg/dL (ref 8.4–10.5)
Chloride: 99 mEq/L (ref 96–112)
Creatinine, Ser: 1.04 mg/dL (ref 0.40–1.50)
GFR: 95.08 mL/min (ref >60.00)
Glucose, Bld: 235 mg/dL — ABNORMAL HIGH (ref 70–99)
Potassium: 4.9 mEq/L (ref 3.5–5.1)
Sodium: 134 mEq/L — ABNORMAL LOW (ref 135–145)
Total Bilirubin: 0.8 mg/dL (ref 0.2–1.2)
Total Protein: 7.5 g/dL (ref 6.0–8.3)

## 2018-11-04 LAB — HEMOGLOBIN A1C: Hgb A1c MFr Bld: 8.8 % — ABNORMAL HIGH (ref 4.6–6.5)

## 2018-11-04 MED ORDER — BENAZEPRIL HCL 40 MG PO TABS: 40.0000 mg | ORAL_TABLET | Freq: Every day | ORAL | 3 refills | Status: DC

## 2018-11-04 MED ORDER — CLONIDINE 0.1 MG/24HR TD PTWK: MEDICATED_PATCH | TRANSDERMAL | 3 refills | Status: DC

## 2018-11-04 NOTE — Patient Instructions (Addendum)
Health Maintenance Due  Topic Date Due  . PNEUMOCOCCAL POLYSACCHARIDE VACCINE AGE 41-64 HIGH RISK  07/14/1979  . OPHTHALMOLOGY EXAM  07/14/1987  . TETANUS/TDAP  07/13/1996  . FOOT EXAM  09/15/2016  . INFLUENZA VACCINE  09/14/2018    Depression screen [facility] 2/9 11/15/2017 05/02/2016 03/30/2016  Decreased Interest 0 0 0  Down, Depressed, Hopeless 0 0 0  PHQ - 2 Score 0 0 0  Some recent data might be hidden

## 2018-11-04 NOTE — Addendum Note (Signed)
Addended by: Suzette Battiest on: 11/04/2018 10:01 AM   Modules accepted: Orders

## 2018-11-04 NOTE — Progress Notes (Signed)
   Subjective:    Patient ID: Danny Fuller, male    DOB: 09-07-1975, 43 y.o.   MRN: [id number]  HPI Here to follow up on HTN. He sees Dr. Posey Pronto for neuropathy and he sees Dr. Dwyane Dee for diabetes. His last A1c was 12.8. his BP has been stable at home in the range of 140s over 90s. His Clonidine patch came off last night in the shower, so that explains his elevated BP today. He feels well in general. He uses Tramadol prn for pain.    Review of Systems  Constitutional: Negative.   Respiratory: Negative.   Cardiovascular: Negative.   Neurological: Positive for numbness.       Objective:   Physical Exam Constitutional:      Appearance: Normal appearance.  Cardiovascular:     Rate and Rhythm: Normal rate and regular rhythm.     Pulses: Normal pulses.     Heart sounds: Normal heart sounds.  Pulmonary:     Effort: Pulmonary effort is normal.     Breath sounds: Normal breath sounds.  Lymphadenopathy:     Cervical: No cervical adenopathy.  Neurological:     Mental Status: He is alert and oriented to person, place, and time. Mental status is at baseline.           Assessment & Plan:  HTN, stable. He will follow up with Dr. Dwyane Dee for the diabetes. For the neuropathy, he takes Nortriptyline and Lyrica, but he supplements this with Tramadol. We will set up a pain management program for him to continue the Tramadol.

## 2018-11-06 LAB — PAIN MGMT, PROFILE 8 W/CONF, U
6 Acetylmorphine: NEGATIVE ng/mL
Alcohol Metabolites: POSITIVE ng/mL — AB (ref <500)
Amphetamines: NEGATIVE ng/mL
Benzodiazepines: NEGATIVE ng/mL
Buprenorphine, Urine: NEGATIVE ng/mL
Cocaine Metabolite: NEGATIVE ng/mL
Creatinine: 165.9 mg/dL
Ethyl Sulfate (ETS): 1241 ng/mL
MDMA: NEGATIVE ng/mL
Marijuana Metabolite: 35 ng/mL
Marijuana Metabolite: POSITIVE ng/mL
Opiates: NEGATIVE ng/mL
Oxidant: NEGATIVE ug/mL
Oxycodone: NEGATIVE ng/mL
pH: 6.5 (ref 4.5–9.0)

## 2018-11-13 ENCOUNTER — Ambulatory Visit: Payer: Medicare Other | Admitting: Podiatry

## 2018-12-06 ENCOUNTER — Other Ambulatory Visit: Payer: Self-pay

## 2018-12-06 ENCOUNTER — Other Ambulatory Visit: Payer: Medicare Other

## 2018-12-10 ENCOUNTER — Ambulatory Visit: Payer: Medicare Other | Admitting: Endocrinology

## 2018-12-16 DIAGNOSIS — Z89422 Acquired absence of other left toe(s): Secondary | ICD-10-CM | POA: Diagnosis not present

## 2018-12-16 DIAGNOSIS — L84 Corns and callosities: Secondary | ICD-10-CM | POA: Diagnosis not present

## 2018-12-16 DIAGNOSIS — E1140 Type 2 diabetes mellitus with diabetic neuropathy, unspecified: Secondary | ICD-10-CM | POA: Diagnosis not present

## 2018-12-16 DIAGNOSIS — E1142 Type 2 diabetes mellitus with diabetic polyneuropathy: Secondary | ICD-10-CM | POA: Insufficient documentation

## 2019-01-08 ENCOUNTER — Encounter: Payer: Self-pay | Admitting: Neurology

## 2019-01-13 ENCOUNTER — Encounter: Payer: Self-pay | Admitting: Neurology

## 2019-01-13 ENCOUNTER — Telehealth (INDEPENDENT_AMBULATORY_CARE_PROVIDER_SITE_OTHER): Payer: Medicare Other | Admitting: Neurology

## 2019-01-13 ENCOUNTER — Other Ambulatory Visit: Payer: Self-pay

## 2019-01-13 VITALS — Ht 70.0 in | Wt 270.0 lb

## 2019-01-13 DIAGNOSIS — E1142 Type 2 diabetes mellitus with diabetic polyneuropathy: Secondary | ICD-10-CM

## 2019-01-13 DIAGNOSIS — I70245 Atherosclerosis of native arteries of left leg with ulceration of other part of foot: Secondary | ICD-10-CM

## 2019-01-13 MED ORDER — NORTRIPTYLINE HCL 10 MG PO CAPS: ORAL_CAPSULE | ORAL | 1 refills | Status: DC

## 2019-01-13 NOTE — Progress Notes (Signed)
Virtual Visit via Video Note The purpose of this virtual visit is to provide medical care while limiting exposure to the novel coronavirus.    Consent was obtained for video visit:  Yes.   Answered questions that patient had about telehealth interaction:  Yes.   I discussed the limitations, risks, security and privacy concerns of performing an evaluation and management service by telemedicine. I also discussed with the patient that there may be a patient responsible charge related to this service. The patient expressed understanding and agreed to proceed.  Pt location: Home Physician Location: office Name of referring provider:  [name], MD I connected with Danny Fuller at patients initiation/request on 01/13/2019 at  9:30 AM EST by video enabled telemedicine application and verified that I am speaking with the correct person using two identifiers. Pt MRN:  [id number] Pt DOB:  07-25-1975 Video Participants:  Danny Fuller   History of Present Illness: This is a 43 y.o. male returning for follow-up of painful diabetic neuropathy.  He complains of mild progression of numbness and pain in the feet of the past 6 months.  No new changes in the hands which remain numbness and weak.  He has suffered 1 fall, fortunately he did not injure himself.  He remains on nortriptyline 40 mg at bedtime and Lyrica 150 mg 3 times daily.  For his breakthrough pain he has been taking tramadol, which is prescribed by his primary care doctor.  He has a new callus on his foot and will be seeing podiatry for this.  He is compliant with checking his feet daily and using his cane.  His HbA1c has improved from 11.6 > 8.8 which he attributes to being able to get his pump to function better.  He has gained about 20lb since the Spring.   Observations/Objective:   Vitals:   01/08/19 1034  Weight: 270 lb (122.5 kg)  Height: 5\' 10"  (1.778 m)   Patient is awake, alert, and appears comfortable.  Oriented x 4.    Extraocular muscles are intact. No ptosis.  Face is symmetric.  Speech is not dysarthric. Antigravity in all extremities, there is mild atrophy of the intrinsic hand muscles, specifically weakness with full abduction of the left APB Gait is wide-based and appears stable, unassisted.  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  Clinically with mild progression of pain in the feet.  On exam, he has distal weakness, sensory loss, and sensory ataxia. I praised him for medication compliance and improving his A1c down to 8.8, however I am concerned that he has gained 20lb and encouraged him to start a diet and exercise program. For now, I will keep him on nortriptyline 40 mg at bedtime and Lyrica 150 mg 3 times daily.  We discussed increasing nortriptyline to 50 mg at bedtime, should his pain increase.  He will need EKG following this change. Patient educated on daily foot inspection, fall prevention, and safety precautions around the home.   Follow Up Instructions: I discussed the assessment and treatment plan with the patient. The patient was provided an opportunity to ask questions and all were answered. The patient agreed with the plan and demonstrated an understanding of the instructions.   The patient was advised to call back or seek an in-person  evaluation if the symptoms worsen or if the condition fails to improve as anticipated.  Follow-up in 5 months   Total time spent:  15 minute    [place], DO

## 2019-01-15 DIAGNOSIS — E1140 Type 2 diabetes mellitus with diabetic neuropathy, unspecified: Secondary | ICD-10-CM | POA: Diagnosis not present

## 2019-01-15 DIAGNOSIS — Z89422 Acquired absence of other left toe(s): Secondary | ICD-10-CM | POA: Diagnosis not present

## 2019-01-15 DIAGNOSIS — L84 Corns and callosities: Secondary | ICD-10-CM | POA: Diagnosis not present

## 2019-01-23 DIAGNOSIS — H401131 Primary open-angle glaucoma, bilateral, mild stage: Secondary | ICD-10-CM | POA: Diagnosis not present

## 2019-01-26 ENCOUNTER — Other Ambulatory Visit: Payer: Self-pay | Admitting: Endocrinology

## 2019-02-03 ENCOUNTER — Ambulatory Visit (INDEPENDENT_AMBULATORY_CARE_PROVIDER_SITE_OTHER): Payer: Medicare Other | Admitting: Family Medicine

## 2019-02-03 ENCOUNTER — Other Ambulatory Visit: Payer: Self-pay

## 2019-02-03 ENCOUNTER — Encounter: Payer: Self-pay | Admitting: Family Medicine

## 2019-02-03 VITALS — BP 140/84 | HR 106 | Temp 97.6°F | Wt 277.4 lb

## 2019-02-03 DIAGNOSIS — I70245 Atherosclerosis of native arteries of left leg with ulceration of other part of foot: Secondary | ICD-10-CM | POA: Diagnosis not present

## 2019-02-03 DIAGNOSIS — E1140 Type 2 diabetes mellitus with diabetic neuropathy, unspecified: Secondary | ICD-10-CM | POA: Diagnosis not present

## 2019-02-03 DIAGNOSIS — E1142 Type 2 diabetes mellitus with diabetic polyneuropathy: Secondary | ICD-10-CM | POA: Diagnosis not present

## 2019-02-03 DIAGNOSIS — I10 Essential (primary) hypertension: Secondary | ICD-10-CM

## 2019-02-03 DIAGNOSIS — I739 Peripheral vascular disease, unspecified: Secondary | ICD-10-CM

## 2019-02-03 DIAGNOSIS — G63 Polyneuropathy in diseases classified elsewhere: Secondary | ICD-10-CM

## 2019-02-03 DIAGNOSIS — Z794 Long term (current) use of insulin: Secondary | ICD-10-CM

## 2019-02-03 MED ORDER — FUROSEMIDE 20 MG PO TABS: 20.0000 mg | ORAL_TABLET | Freq: Every day | ORAL | 3 refills | Status: DC

## 2019-02-03 NOTE — Progress Notes (Signed)
   Subjective:    Patient ID: Danny Fuller, male    DOB: 07/03/1975, 43 y.o.   MRN: [id number]  HPI Here for several issues. First he is following up on HTN this seems to be doing well, though he has brief elevations now and then. He has not taken Labetalol in about 3 months because he found it caused his BP to drop too much. He sees Dr. Dwyane Dee for the diabetes and he sees Dr. Posey Pronto for polyneuropathy. The numbness and burning in his feet and hands seems to be stable for now. Nortriptyline has helped. He is seeing Dr. Doran Durand for his feet and they are working on some hard calluses on both feet. He is trying to get some diabetic shoes, and has asked [name] to help with this. Finally he has had some more swelling in the lower legs, the left ore than right. He has been off diuretics for some months now. No SOB.    Review of Systems  Constitutional: Negative.   Respiratory: Negative.   Cardiovascular: Positive for leg swelling. Negative for chest pain and palpitations.  Neurological: Positive for numbness.       Objective:   Physical Exam Constitutional:      Appearance: He is obese.  Cardiovascular:     Rate and Rhythm: Normal rate and regular rhythm.     Pulses: Normal pulses.     Heart sounds: Normal heart sounds.  Pulmonary:     Effort: Pulmonary effort is normal.     Breath sounds: Normal breath sounds.  Musculoskeletal:     Comments: He has 1+ edema in the right lower leg and 2+ edema in the left lower leg. The soles of both feet are callused and hard. Sensation to light touch are decreased on both feet. No ulcerations are seen. DP and PT pulses are full. He has fungal involvement in all toenails.   Neurological:     Mental Status: He is alert and oriented to person, place, and time. Mental status is at baseline.           Assessment & Plan:  His HTN is stable. His polyneuropathy is stable. He will follow up with Dr. Dwyane Dee for the diabetes. He will send [name] some paper work to  fill out in order to get diabetic shoes. For the leg edema he will start back on Lasix 20 mg daily.  [name], MD

## 2019-02-27 DIAGNOSIS — H401131 Primary open-angle glaucoma, bilateral, mild stage: Secondary | ICD-10-CM | POA: Diagnosis not present

## 2019-02-27 DIAGNOSIS — L84 Corns and callosities: Secondary | ICD-10-CM | POA: Diagnosis not present

## 2019-02-27 DIAGNOSIS — Z89422 Acquired absence of other left toe(s): Secondary | ICD-10-CM | POA: Diagnosis not present

## 2019-02-27 DIAGNOSIS — E1140 Type 2 diabetes mellitus with diabetic neuropathy, unspecified: Secondary | ICD-10-CM | POA: Diagnosis not present

## 2019-03-27 DIAGNOSIS — L84 Corns and callosities: Secondary | ICD-10-CM | POA: Diagnosis not present

## 2019-03-27 DIAGNOSIS — Z89422 Acquired absence of other left toe(s): Secondary | ICD-10-CM | POA: Diagnosis not present

## 2019-03-27 DIAGNOSIS — E1140 Type 2 diabetes mellitus with diabetic neuropathy, unspecified: Secondary | ICD-10-CM | POA: Diagnosis not present

## 2019-04-23 IMAGING — CT CT HEAD W/O CM
4 series · 16 of 47 positions shown, 18 images · non-contrast
Comparison: MRI head 02/08/2017

CLINICAL DATA: Focal neuro deficit less than 6 hours. Suspect
stroke. Right arm numbness.

EXAM:
CT HEAD WITHOUT CONTRAST
TECHNIQUE: Contiguous axial images were obtained from the base of the skull
through the vertex without intravenous contrast.

[Series 3: head wo · axial · 0.45mm/px · z∈[-132,-12]mm · 7 of 33 slices shown, 9 images]
[im 5/33  brain]
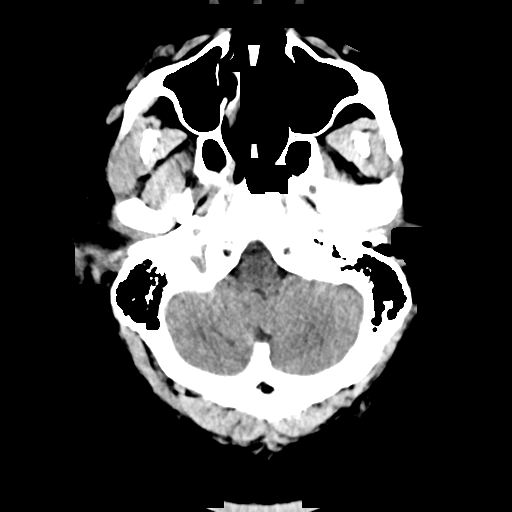
[im 5/33  bone]
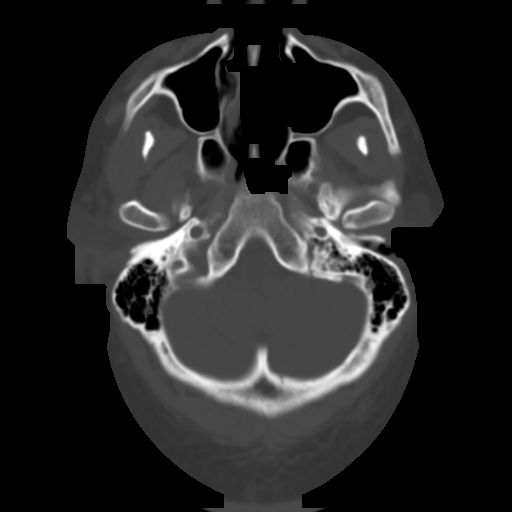
[im 9/33  brain]
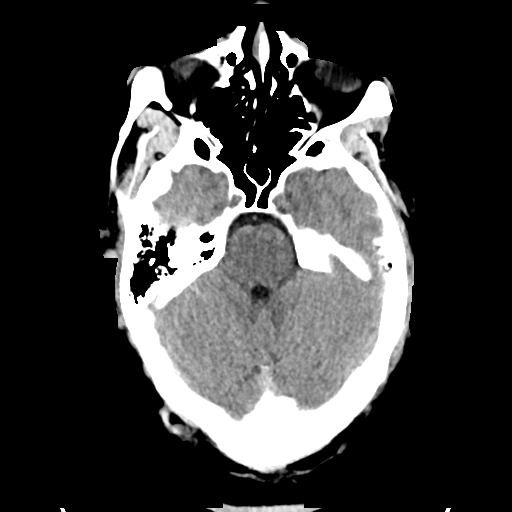
[im 13/33  brain]
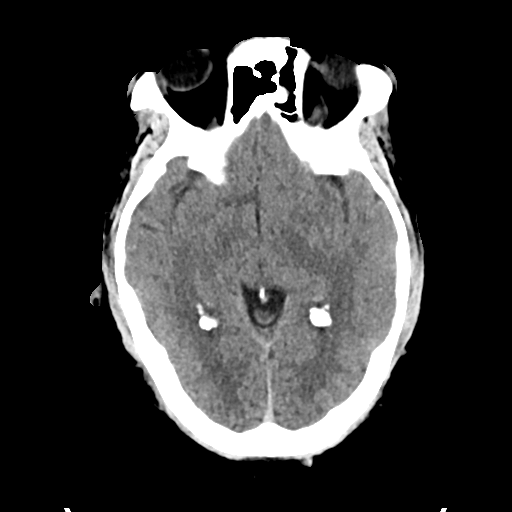
[im 17/33  brain]
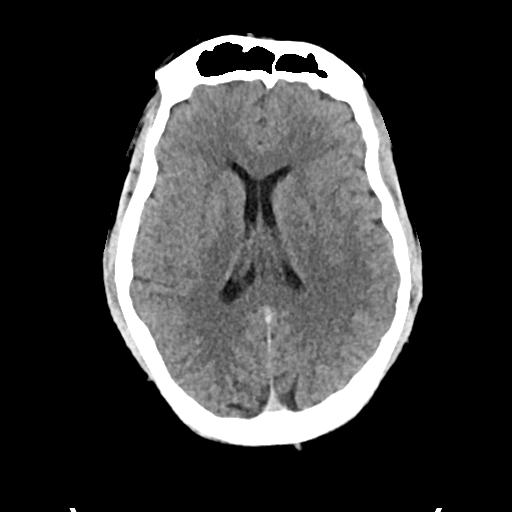
[im 21/33  brain]
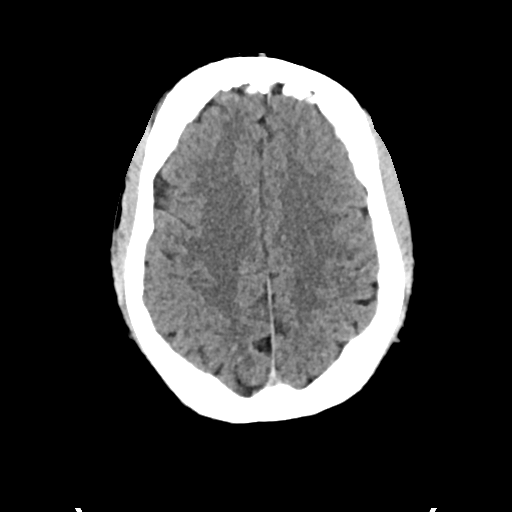
[im 21/33  bone]
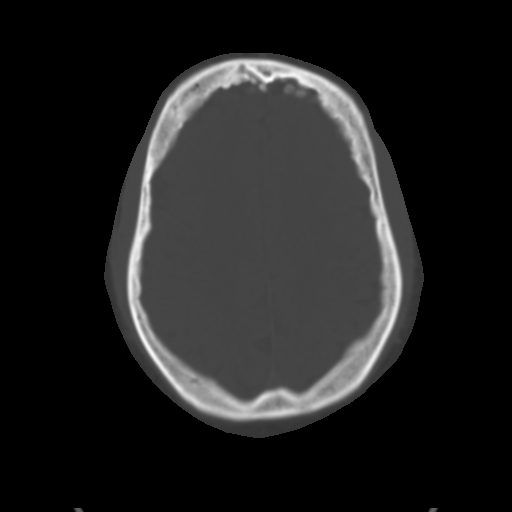
[im 25/33  brain]
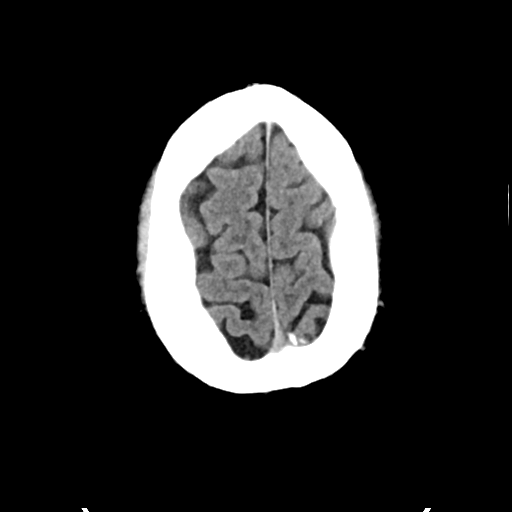
[im 29/33  brain]
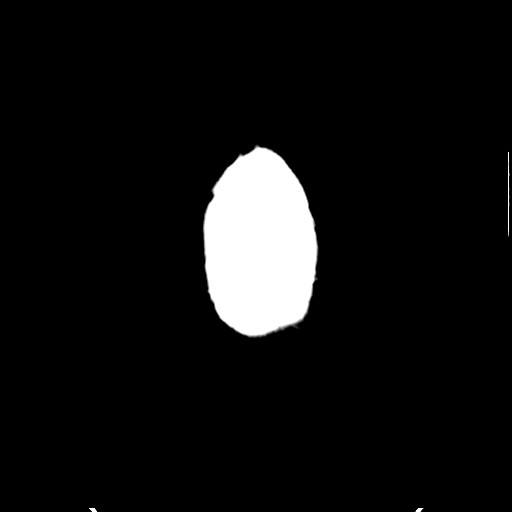

[Series 4: head bone · axial · 0.45mm/px · z∈[-136,-104]mm · 3 of 83 slices shown]
[im 9/83  bone]
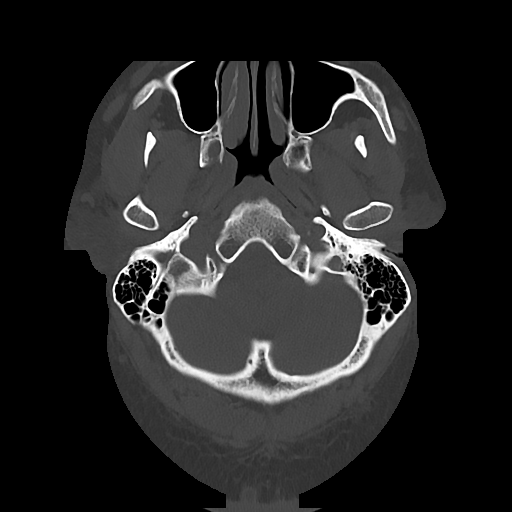
[im 17/83  bone]
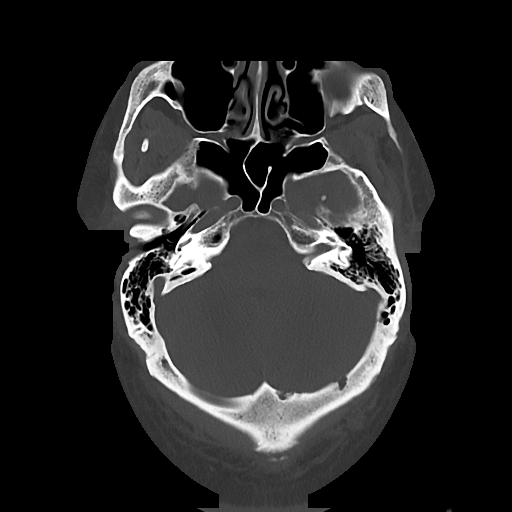
[im 25/83  bone]
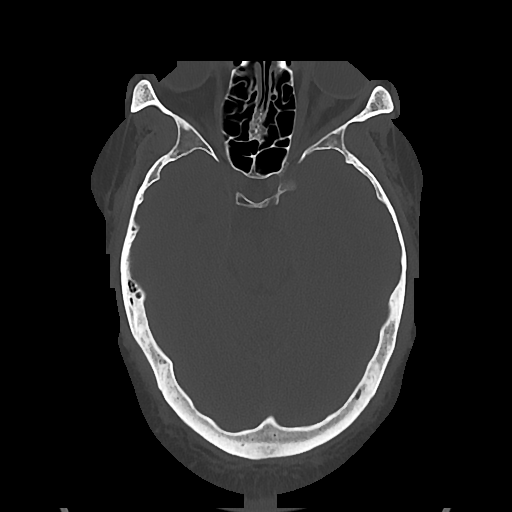

[Series 5: cor soft · coronal · 0.32mm/px · 3 of 72 slices shown]
[im 24/72  brain]
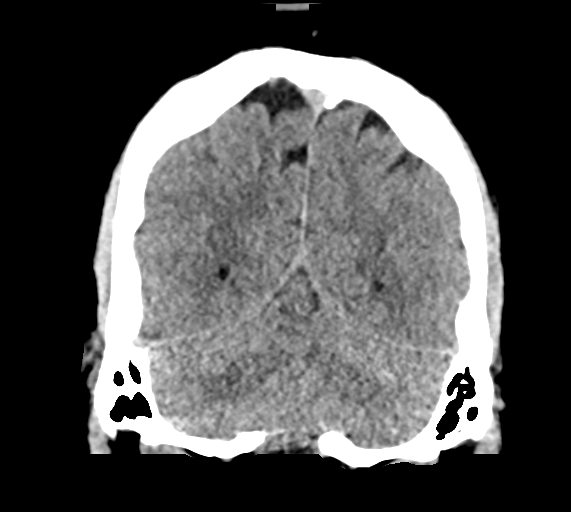
[im 32/72  brain]
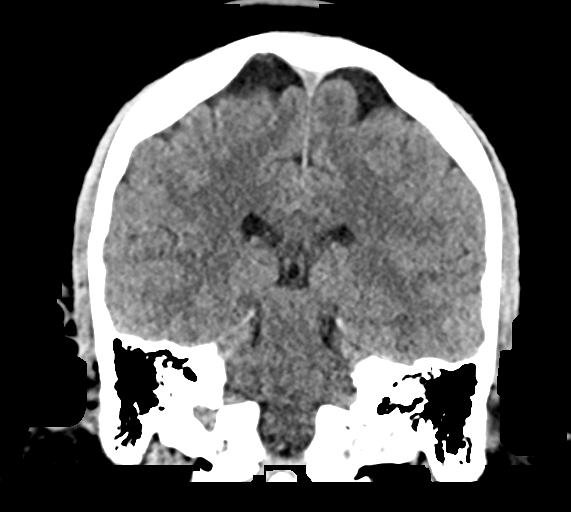
[im 40/72  brain]
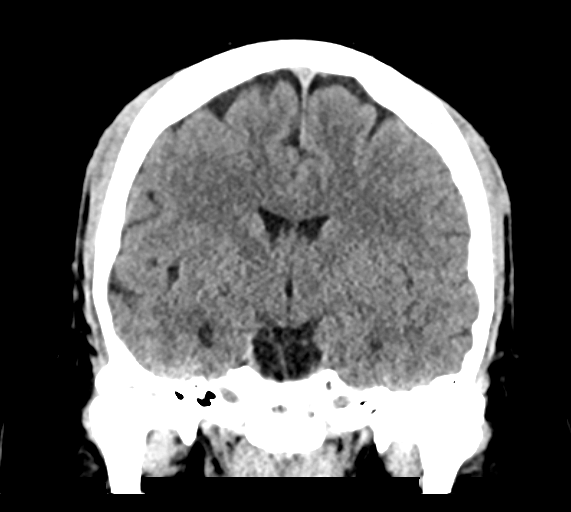

[Series 6: sag soft · sagittal · 0.32mm/px · 3 of 67 slices shown]
[im 23/67  brain]
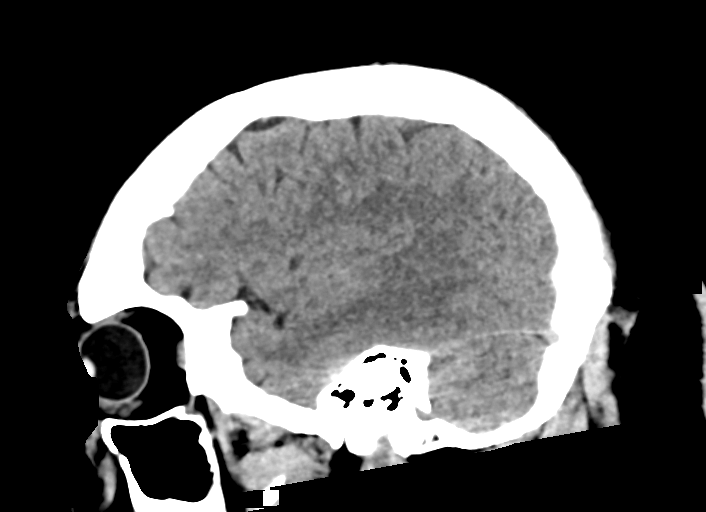
[im 34/67  brain]
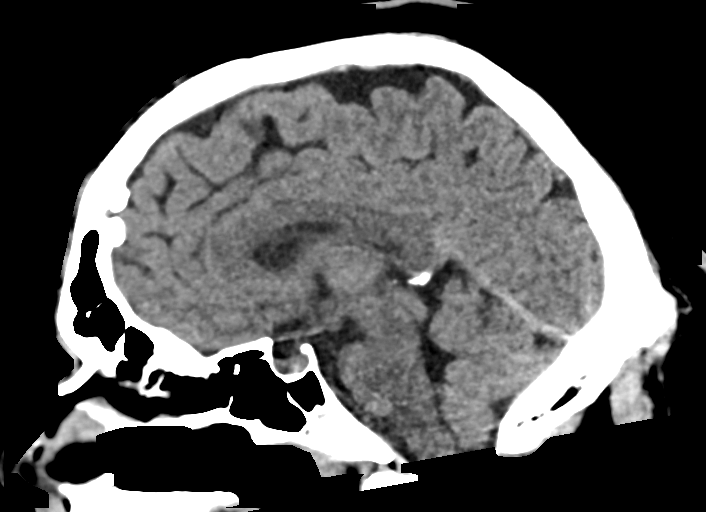
[im 45/67  brain]
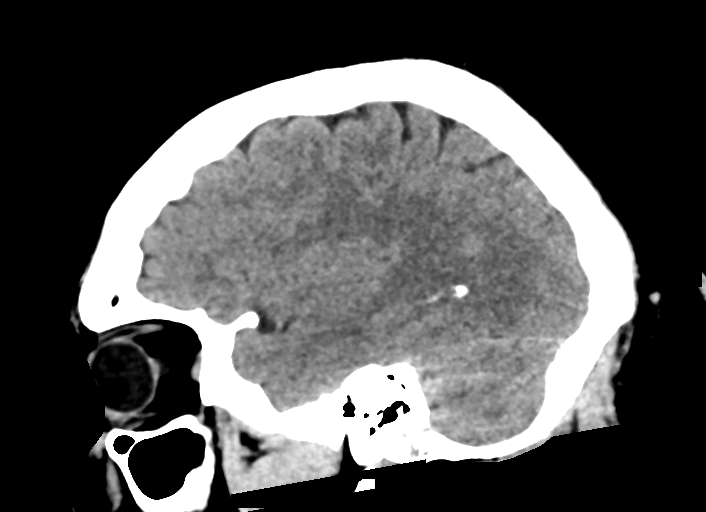

[16 of 47 positions shown; findings below may reference images not displayed]

FINDINGS: Brain: No evidence of acute infarction, hemorrhage, hydrocephalus,
extra-axial collection or mass lesion/mass effect.

Vascular: Negative for hyperdense vessel

Skull: Negative

Sinuses/Orbits: Mild mucosal edema right maxillary sinus otherwise
clear. Negative orbit.

Other: None
IMPRESSION: Normal CT of the brain.

## 2019-04-25 DIAGNOSIS — Z89422 Acquired absence of other left toe(s): Secondary | ICD-10-CM | POA: Diagnosis not present

## 2019-04-25 DIAGNOSIS — L84 Corns and callosities: Secondary | ICD-10-CM | POA: Diagnosis not present

## 2019-04-25 DIAGNOSIS — E1140 Type 2 diabetes mellitus with diabetic neuropathy, unspecified: Secondary | ICD-10-CM | POA: Diagnosis not present

## 2019-05-26 ENCOUNTER — Encounter: Payer: Self-pay | Admitting: Neurology

## 2019-05-26 ENCOUNTER — Other Ambulatory Visit: Payer: Self-pay

## 2019-05-26 ENCOUNTER — Ambulatory Visit (INDEPENDENT_AMBULATORY_CARE_PROVIDER_SITE_OTHER): Payer: Medicare Other | Admitting: Neurology

## 2019-05-26 VITALS — BP 173/118 | HR 91 | Resp 20 | Ht 70.0 in | Wt 274.0 lb

## 2019-05-26 DIAGNOSIS — M4306 Spondylolysis, lumbar region: Secondary | ICD-10-CM | POA: Diagnosis not present

## 2019-05-26 DIAGNOSIS — E1142 Type 2 diabetes mellitus with diabetic polyneuropathy: Secondary | ICD-10-CM | POA: Diagnosis not present

## 2019-05-26 MED ORDER — NORTRIPTYLINE HCL 10 MG PO CAPS: ORAL_CAPSULE | ORAL | 3 refills | Status: DC

## 2019-05-26 NOTE — Progress Notes (Signed)
Follow-up Visit   Date: 05/26/19    Danny Fuller MRN: [id number] DOB: [birth year]   Interim History: Danny Fuller is a 44 y.o. left-handed African American male with diabetes mellitus, GERD, and hypertension returning to the clinic for follow-up of diabetic neuropathy.  The patient was accompanied to the clinic by self.  History of present illness: He was diagnosed with diabetes in 2002 and began having having numbness of the feet in 2016.  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  Numbness involves the entire feet, which is worse on the left.  He also complain of tingling involving the tingling and lower legs.  He takes Lyrica 150mg  twice daily which provides about 35% relief and allows him to sleep.  He has tried gabapentin, which did not provide any relief.   He also complains of numbness and tingling over the upper arm and into his finger tips, which is worse on the right.  He has weakness of the hands and frequently drops objects.  He also complains of achy neck pain.    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  His mother, brother, sister, and materal grandmother also have diabetic neuropathy.  His mother had three toes amputated and brother had BKA.   EDX in November 2018 showed neuropathy affecting a length-dependent pattern also involving the hands, as well as a right ulnar neuropathy at the elbow.     Repeat NCS/EMG in 2019 showed worsening polyradiculoneuropathy in the hands.  CSF testing was declined by patient to look whether this is diabetic polyradiculoneuropathy vs immune-mediated, but my primary concern is with diabetes getting worse, this is diabetic polyradiculoneuropathy.    UPDATE 05/26/2019:  He is here for follow-up visit.  He has noticed worsening numbness in the feet and lower legs, especially the left.  Tingling and burning pain is well-controlled on Lyrica 150mg  TID and  nortriptyline 40mg  daily.  Numbness in the hands is stable, he does tend to hurt his hands because of the lack of sensation. No problems with manipulating pedals when driving.  He denies low back pain.     Medications:  Current Outpatient Medications on File Prior to Visit  Medication Sig Dispense Refill  . benazepril (LOTENSIN) 40 MG tablet Take 1 tablet (40 mg total) by mouth daily. 90 tablet 3  . cloNIDine (CATAPRES - DOSED IN MG/24 HR) 0.1 mg/24hr patch APPLY 1 PATCH(0.1 MG) EXTERNALLY TO THE SKIN 1 TIME A WEEK 12 patch 3  . cloNIDine (CATAPRES - DOSED IN MG/24 HR) 0.2 mg/24hr patch     . empagliflozin (JARDIANCE) 25 MG TABS tablet Take 25 mg by mouth daily. 30 tablet 3  . furosemide (LASIX) 20 MG tablet Take 1 tablet (20 mg total) by mouth daily. 90 tablet 3  . glucose blood test strip Use Bayer Contour Next Test strips as instructed to check blood sugar 5 times daily. 150 each 3  . Insulin Disposable Pump (OMNIPOD DASH 5 PACK PODS) MISC Inject 1 each into the skin continuous. Apply 1 OmniPod insulin pump to body daily for insulin delivery. 30 each 12  . insulin glargine (LANTUS) 100 UNIT/ML injection Inject 60 Units into the skin 2 (two) times daily.    . Insulin Pen Needle (FIFTY50 PEN NEEDLES) 31G X 5 MM MISC Use to inject insulin once daily    . labetalol (NORMODYNE) 100 MG tablet Take 1 tablet (100  mg total) by mouth 2 (two) times daily. TAKE 1 TABLET BY MOUTH TWICE DAILY. 60 tablet 3  . LYRICA 150 MG capsule Take 1 capsule (150 mg total) by mouth 3 (three) times daily. 90 capsule 5  . nortriptyline (PAMELOR) 10 MG capsule TAKE 4 CAPSULES BY MOUTH AT BEDTIME 360 capsule 1  . NOVOLOG 100 UNIT/ML injection INJECT UPTO 170 UNITS VIA PUMP 60 mL 2  . pravastatin (PRAVACHOL) 40 MG tablet Take 1 tablet (40 mg total) by mouth every evening. 90 tablet 3  . tadalafil (ADCIRCA/CIALIS) 20 MG tablet Take 1 tablet (20 mg total) by mouth daily as needed for erectile dysfunction. 10 tablet 5  .  traMADol (ULTRAM) 50 MG tablet Take 2 tablets (100 mg total) by mouth every 6 (six) hours as needed for moderate pain. 120 tablet 5   No current facility-administered medications on file prior to visit.    Allergies:  Allergies  Allergen Reactions  . Vancomycin Other (See Comments)    AKI    Vital Signs:  BP (!) 173/118   Pulse 91   Resp 20   Ht 5\' 10"  (1.778 m)   Wt 274 lb (124.3 kg)   SpO2 100%   BMI 39.31 kg/m   Neurological Exam: MENTAL STATUS including orientation to time, place, person, recent and remote memory, attention span and concentration, language, and fund of knowledge is normal.  Speech is not dysarthric.  CRANIAL NERVES:  Normal conjugate, extra-ocular eye movements in all directions of gaze.  No ptosis.  MOTOR:  Motor strength is 5/5 in all extremities, except distally finger abductors 4/5, toe extensors and flexors 4/5, left finger extensors 4/5 (worse). No pronator drift.  S/p 5th toe amputation on the left foot.     MSRs:  Reflexes are 2+/4 in the upper extremities, 1+ at the left patella, and absent R patella and bilateral ankles  SENSORY:  Sensory exam shows absent temperature over the wrists and hands, and below the mid-calf bilaterally.  Vibration reduced at the knees, absent distal to ankles.   COORDINATION/GAIT:  Normal finger-to- nose-finger.   Gait narrow based and stable, unasssisted.   Data: NCS/EMG of the right side 12/14/2016:   1. 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  Overall, these findings are severe in degree electrically. 2. 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  NCS/EMG of the upper extremities 07/31/2017:  The electrophysiologic findings are most suggestive of a sensorimotor polyradiculoneuropathy affecting the upper extremities, moderate in degree electrically, which is  new when compared to his previous study dated 12/31/2016. Alternatively, progression of sensorimotor demyelinating and axonal polyneuropathy with a superimposed ulnar neuropathy across the elbow (worse on the left), cannot be excluded. Correlate clinically.  MRI lumbar spine wo contrast 08/22/2017: 1. 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 The left foramen also appears mildly narrowed. 2. Mild left foraminal narrowing at L5-S1 without definite nerve root encroachment. 3. No other significant acquired disc space findings. Congenitally short pedicles.  Lab Results  Component Value Date   HGBA1C 8.8 (H) 11/04/2018   Lab Results  Component Value Date   CREATININE 1.04 11/04/2018   BUN 17 11/04/2018   NA 134 (L) 11/04/2018   K 4.9 11/04/2018   CL  99 11/04/2018   CO2 27 11/04/2018    IMPRESSION/PLAN: 1.  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  Start PT for low back strengthening, in the event, that his L5 nerve root impingement is causing asymmetrical left leg numbness 2.  Continue Lyrica 150mg  TID 3.  Continue nortriptyline 40mg  at bedtime 4.  Patient educated on daily foot inspection, fall prevention, and safety precautions around the home.  Return to clinic in 6 months   Thank you for allowing me to participate in patient's care.  If I can answer any additional questions, I would be pleased to do so.    Sincerely,    Brayten Komar K. Posey Pronto, DO

## 2019-05-26 NOTE — Patient Instructions (Addendum)
Start physical therapy for low back strengthening and stretching   Continue Lyrica 150mg  three times daily and nortriptyline 40mg  at bedtime  Return to clinic in 6 month

## 2019-06-03 ENCOUNTER — Other Ambulatory Visit: Payer: Self-pay | Admitting: Endocrinology

## 2019-06-03 DIAGNOSIS — E1165 Type 2 diabetes mellitus with hyperglycemia: Secondary | ICD-10-CM

## 2019-06-03 DIAGNOSIS — E7800 Pure hypercholesterolemia, unspecified: Secondary | ICD-10-CM

## 2019-06-03 DIAGNOSIS — Z794 Long term (current) use of insulin: Secondary | ICD-10-CM

## 2019-06-05 ENCOUNTER — Other Ambulatory Visit (INDEPENDENT_AMBULATORY_CARE_PROVIDER_SITE_OTHER): Payer: Medicare Other

## 2019-06-05 ENCOUNTER — Other Ambulatory Visit: Payer: Self-pay

## 2019-06-05 DIAGNOSIS — E7800 Pure hypercholesterolemia, unspecified: Secondary | ICD-10-CM

## 2019-06-05 DIAGNOSIS — E1165 Type 2 diabetes mellitus with hyperglycemia: Secondary | ICD-10-CM

## 2019-06-05 DIAGNOSIS — Z794 Long term (current) use of insulin: Secondary | ICD-10-CM | POA: Diagnosis not present

## 2019-06-05 LAB — MICROALBUMIN / CREATININE URINE RATIO
Creatinine,U: 68.8 mg/dL
Microalb Creat Ratio: 186.4 mg/g — ABNORMAL HIGH (ref 0.0–30.0)
Microalb, Ur: 128.3 mg/dL — ABNORMAL HIGH (ref 0.0–1.9)

## 2019-06-05 LAB — HEMOGLOBIN A1C: Hgb A1c MFr Bld: 10.7 % — ABNORMAL HIGH (ref 4.6–6.5)

## 2019-06-05 LAB — LIPID PANEL
Cholesterol: 146 mg/dL (ref 0–200)
HDL: 28.1 mg/dL — ABNORMAL LOW (ref >39.00)
NonHDL: 117.44
Total CHOL/HDL Ratio: 5
Triglycerides: 226 mg/dL — ABNORMAL HIGH (ref 0.0–149.0)
VLDL: 45.2 mg/dL — ABNORMAL HIGH (ref 0.0–40.0)

## 2019-06-05 LAB — COMPREHENSIVE METABOLIC PANEL
ALT: 18 U/L (ref 0–53)
AST: 17 U/L (ref 0–37)
Albumin: 3.8 g/dL (ref 3.5–5.2)
Alkaline Phosphatase: 75 U/L (ref 39–117)
BUN: 19 mg/dL (ref 6–23)
CO2: 29 mEq/L (ref 19–32)
Calcium: 9.4 mg/dL (ref 8.4–10.5)
Chloride: 98 mEq/L (ref 96–112)
Creatinine, Ser: 1.09 mg/dL (ref 0.40–1.50)
GFR: 89.81 mL/min (ref >60.00)
Glucose, Bld: 350 mg/dL — ABNORMAL HIGH (ref 70–99)
Potassium: 4.4 mEq/L (ref 3.5–5.1)
Sodium: 133 mEq/L — ABNORMAL LOW (ref 135–145)
Total Bilirubin: 0.5 mg/dL (ref 0.2–1.2)
Total Protein: 7 g/dL (ref 6.0–8.3)

## 2019-06-05 LAB — LDL CHOLESTEROL, DIRECT: Direct LDL: 85 mg/dL

## 2019-06-06 DIAGNOSIS — E1140 Type 2 diabetes mellitus with diabetic neuropathy, unspecified: Secondary | ICD-10-CM | POA: Diagnosis not present

## 2019-06-06 DIAGNOSIS — Z89422 Acquired absence of other left toe(s): Secondary | ICD-10-CM | POA: Diagnosis not present

## 2019-06-06 DIAGNOSIS — B351 Tinea unguium: Secondary | ICD-10-CM | POA: Diagnosis not present

## 2019-06-10 ENCOUNTER — Other Ambulatory Visit: Payer: Self-pay

## 2019-06-10 ENCOUNTER — Encounter: Payer: Self-pay | Admitting: Endocrinology

## 2019-06-10 ENCOUNTER — Ambulatory Visit (INDEPENDENT_AMBULATORY_CARE_PROVIDER_SITE_OTHER): Payer: Medicare Other | Admitting: Endocrinology

## 2019-06-10 VITALS — BP 170/90 | HR 101 | Ht 70.0 in | Wt 265.8 lb

## 2019-06-10 DIAGNOSIS — E1165 Type 2 diabetes mellitus with hyperglycemia: Secondary | ICD-10-CM

## 2019-06-10 DIAGNOSIS — Z794 Long term (current) use of insulin: Secondary | ICD-10-CM | POA: Diagnosis not present

## 2019-06-10 DIAGNOSIS — I10 Essential (primary) hypertension: Secondary | ICD-10-CM | POA: Diagnosis not present

## 2019-06-10 MED ORDER — VICTOZA 18 MG/3ML ~~LOC~~ SOPN: 1.2000 mg | PEN_INJECTOR | Freq: Every day | SUBCUTANEOUS | 3 refills | Status: DC

## 2019-06-10 MED ORDER — GLUCOSE BLOOD VI STRP: ORAL_STRIP | 3 refills | Status: DC

## 2019-06-10 MED ORDER — CLONIDINE 0.1 MG/24HR TD PTWK: MEDICATED_PATCH | TRANSDERMAL | 3 refills | Status: DC

## 2019-06-10 NOTE — Progress Notes (Signed)
Patient ID: Danny Fuller, male   DOB: 04-Jan-1976, 44 y.o.   MRN: [id number]            Reason for Appointment: Follow-up for Type 2 Diabetes and hypertension  Referring physician: Sarajane Jews   History of Present Illness:          Date of diagnosis of type 2 diabetes mellitus: 2007        Background history:   At diagnosis he was relatively asymptomatic and was started on metformin and glipizide He has been on the same oral hypoglycemic regimen for several years. Review of his A1c indicates it has been markedly increased persistently with the lowest reading in 2015 being 9.1  Recent history:   INSULIN regimen is: OMNIPOD pump since 01/05/17  BASAL rate midnight = 3.0, 6:30 AM = 6.0  Carbohydrate ratio 1: 30 at midnight, 15 at 6 AM and 30 at 6 PM , sensitivity 1: 15 at midnight, 1: 20 between 6 AM and 6:30 PM and 1: 28 after 6:30 PM Targets 120 in the daytime, 1 6:40 PM and 100 at midnight Active insulin 3 hours  Non-insulin hypoglycemic drugs the patient is taking are:?  Metformin ER 2000 mg daily, Jardiance 25 mg daily   His A1c is up to 10.7  Last visit was in 09/2018   Current management, blood sugar patterns and problems identified:  He still has not been using his insulin pump consistently  Although he thinks that he is only about a day late getting his prescription refill sometimes and has not had his pump since last Thursday patient download indicates he has used it only for the 5 days for the last month or so  Also is using his CGM only about 40 to % of the time  Again has not changed his pump settings as directed  He says that his pump will not sometimes allow him to do a bolus when the blood sugar is normal or low even though he has been instructed to make a correction for his low blood sugar and within bolus  His CGM is not programmed properly and is about 5 days ahead with the wrong time  Otherwise blood sugars appear to be mostly high throughout the day  except on a couple of days  Has had only rare hypoglycemia which appeared to be occurring only on one of the days of his last 2 weeks information  Blood sugar monitoring has been inadequate but he says that his insurance will not pay for his Contour test strips  Has only rare blood sugar entered in his pump  Also likely not bolusing for his meals even though he is generally eating 3 meals a day  As before he is not able to exercise much  His weight is slightly lower  He appears not to be taking his Victoza and Metformin but is on Jardiance        Side effects from medications have been: None  Compliance with the medical regimen: Fair  Glucose monitoring:  with freestyle meter on the Omnipod  Blood Glucose readings from pump and monitor download    PRE-MEAL Fasting Lunch Dinner Bedtime Overall  Glucose range:       Mean/median:  234  179  181  204  206   Previous readings:  PRE-MEAL Fasting Lunch Dinner Bedtime Overall  Glucose range:  63-171  80-302  104-408  125-321   Mean/median:     204  185  Self-care: Meal times are:  Breakfast is at 9-10 am, Lunch-1 PM : Dinner: 8 pm   Typical meal intake: Breakfast is Usually toast and boiled eggs.  Usually getting cold cut sandwiches at lunch, grilled chicken and vegetables/potatoes at dinner.  Snacks are usually chips, crackers                Dietician visit, most recent: 05/02/16               Exercise:  Minimal walking, limited by pain  Weight history: His highest weight in the past has been 378    Wt Readings from Last 3 Encounters:  06/10/19 265 lb 12.8 oz (120.6 kg)  05/26/19 274 lb (124.3 kg)  02/03/19 277 lb 6.4 oz (125.8 kg)    Glycemic control:   Lab Results  Component Value Date   HGBA1C 10.7 (H) 06/05/2019   HGBA1C 8.8 (H) 11/04/2018   HGBA1C 12.8 (A) 07/31/2018   Lab Results  Component Value Date   MICROALBUR 128.3 (H) 06/05/2019   LDLCALC 78 07/31/2018   CREATININE 1.09 06/05/2019   Lab  Results  Component Value Date   MICRALBCREAT 186.4 (H) 06/05/2019    Lab Results  Component Value Date   FRUCTOSAMINE 284 09/30/2018   FRUCTOSAMINE 565 (H) 05/10/2018   FRUCTOSAMINE 437 (H) 04/15/2018      Allergies as of 06/10/2019      Reactions   Vancomycin Other (See Comments)   AKI      Medication List       Accurate as of June 10, 2019 12:35 PM. If you have any questions, ask your nurse or doctor.        STOP taking these medications   tadalafil 20 MG tablet Commonly known as: CIALIS Stopped by: [name], MD     TAKE these medications   benazepril 40 MG tablet Commonly known as: LOTENSIN Take 1 tablet (40 mg total) by mouth daily.   cloNIDine 0.1 mg/24hr patch Commonly known as: CATAPRES - Dosed in mg/24 hr APPLY 1 PATCH(0.1 MG) EXTERNALLY TO THE SKIN 1 TIME A WEEK What changed: Another medication with the same name was removed. Continue taking this medication, and follow the directions you see here. Changed by: [name], MD   empagliflozin 25 MG Tabs tablet Commonly known as: Jardiance Take 25 mg by mouth daily.   Fifty50 Pen Needles 31G X 5 MM Misc Generic drug: Insulin Pen Needle Use to inject insulin once daily   furosemide 20 MG tablet Commonly known as: LASIX Take 1 tablet (20 mg total) by mouth daily.   glucose blood test strip Use Bayer Contour Next Test strips as instructed to check blood sugar 4 times daily. What changed: additional instructions Changed by: Jayme Cloud, LPN   insulin glargine 100 UNIT/ML injection Commonly known as: LANTUS Inject 60 Units into the skin 2 (two) times daily.   labetalol 100 MG tablet Commonly known as: NORMODYNE Take 1 tablet (100 mg total) by mouth 2 (two) times daily. TAKE 1 TABLET BY MOUTH TWICE DAILY.   Lyrica 150 MG capsule Generic drug: pregabalin Take 1 capsule (150 mg total) by mouth 3 (three) times daily.   nortriptyline 10 MG capsule Commonly known as: PAMELOR TAKE 4 CAPSULES BY  MOUTH AT BEDTIME   NovoLOG 100 UNIT/ML injection Generic drug: insulin aspart INJECT UPTO 170 UNITS VIA PUMP   OmniPod Dash 5 Pack Pods Misc Inject 1 each into the skin continuous. Apply 1 OmniPod insulin  pump to body daily for insulin delivery.   pravastatin 40 MG tablet Commonly known as: PRAVACHOL Take 1 tablet (40 mg total) by mouth every evening.   traMADol 50 MG tablet Commonly known as: ULTRAM Take 2 tablets (100 mg total) by mouth every 6 (six) hours as needed for moderate pain.       Allergies:  Allergies  Allergen Reactions  . Vancomycin Other (See Comments)    AKI    Past Medical History:  Diagnosis Date  . Asthma   . Back pain    Bulging Disk  . Chronic kidney disease    sees Dr. Jamal Maes    . Diabetes mellitus    sees Dr. Dwyane Dee   . Diabetic foot ulcers ([place])    sees Dr. Wylene Simmer   . GERD (gastroesophageal reflux disease)   . Headache(784.0)   . Hyperlipidemia   . Hypertension   . Sleep apnea    CPAP    Past Surgical History:  Procedure Laterality Date  . AMPUTATION Left 07/26/2015   Procedure: AMPUTATION LEFT FIFTH RAY;  Surgeon: [name], MD;  Location: [facility];  Service: Orthopedics;  Laterality: Left;  . bilateral hip pins placed    . INCISION AND DRAINAGE PERIRECTAL ABSCESS Right 03/07/2016   Procedure: IRRIGATION AND DEBRIDEMENT PERIRECTAL ABSCESS;  Surgeon: Alphonsa Overall, MD;  Location: [facility];  Service: General;  Laterality: Right;  . INCISION AND DRAINAGE PERIRECTAL ABSCESS Right 03/09/2016   Procedure: EXAM UNDER ANESTHESIA, IRRIGATION AND DEBRIDEMENT PERIRECTAL ABSCESS;  Surgeon: Johnathan Hausen, MD;  Location: [facility];  Service: General;  Laterality: Right;  Open, betadine packed wound    Family History  Problem Relation Age of Onset  . Arthritis Other   . Diabetes Other   . Hypertension Other   . Hyperlipidemia Other   . Stroke Other   . Sudden death Other   . Diabetes Mother   . Diabetes Sister   . Diabetes Brother     . Heart attack Father        Died ate 19, couple of "heart attacks"     Social History:  reports that he quit smoking about 3 years ago. His smoking use included cigarettes. He has a 10.00 pack-year smoking history. He has never used smokeless tobacco. He reports that he does not drink alcohol or use drugs.   Review of Systems   Lipid history: LDL has been below 100, mild increase in triglycerides present Has been started on pravastatin by cardiologist    Lab Results  Component Value Date   CHOL 146 06/05/2019   HDL 28.10 (L) 06/05/2019   LDLCALC 78 07/31/2018   LDLDIRECT 85.0 06/05/2019   TRIG 226.0 (H) 06/05/2019   CHOLHDL 5 06/05/2019           Hypertension: On treatment for several years  Currently he is taking benazepril 40 mg but has been out of the 0.1 mg clonidine patch Also on labetalol 100 mg twice daily Also seen by nephrologist previously He says that his blood pressure gets low when he uses the patch but he does not adjust his other medications otherwise, without the clonidine patch his blood pressure is consistently high  Home BP 130/95 recently and tends to be higher in the office   BP Readings from Last 3 Encounters:  06/10/19 (!) 170/90  05/26/19 (!) 173/118  02/03/19 140/84    EDEMA: He has history of previous leg edema However this is mostly on the left  leg and more in the evening  He is not taking Lasix, previously on 60 mg daily    Lab Results  Component Value Date   CREATININE 1.09 06/05/2019   BUN 19 06/05/2019   NA 133 (L) 06/05/2019   K 4.4 06/05/2019   CL 98 06/05/2019   CO2 29 06/05/2019    Neuropathy: He has sharp pains in his feet from neuropathy,  on Lyrica 150 mg  Followed by neurologist Also on nortriptyline at bedtime, now taking 40 mg and asking about simplifying his regimen Duloxetine may not have helped previously   He was recommended diabetic shoes by podiatrist   LABS:  Lab on 06/05/2019  Component Date Value  Ref Range Status  . Cholesterol 06/05/2019 146  0 - 200 mg/dL Final   ATP III Classification       Desirable:  < 200 mg/dL               Borderline High:  200 - 239 mg/dL          High:  > = 240 mg/dL  . Triglycerides 06/05/2019 226.0* 0.0 - 149.0 mg/dL Final   Normal:  <150 mg/dLBorderline High:  150 - 199 mg/dL  . HDL 06/05/2019 28.10* >39.00 mg/dL Final  . VLDL 06/05/2019 45.2* 0.0 - 40.0 mg/dL Final  . Total CHOL/HDL Ratio 06/05/2019 5   Final                  Men          Women1/2 Average Risk     3.4          3.3Average Risk          5.0          4.42X Average Risk          9.6          7.13X Average Risk          15.0          11.0                      . NonHDL 06/05/2019 117.44   Final   NOTE:  Non-HDL goal should be 30 mg/dL higher than patient's LDL goal (i.e. LDL goal of < 70 mg/dL, would have non-HDL goal of < 100 mg/dL)  . Microalb, Ur 06/05/2019 128.3* 0.0 - 1.9 mg/dL Final  . Creatinine,U 06/05/2019 68.8  mg/dL Final  . Microalb Creat Ratio 06/05/2019 186.4* 0.0 - 30.0 mg/g Final  . Sodium 06/05/2019 133* 135 - 145 mEq/L Final  . Potassium 06/05/2019 4.4  3.5 - 5.1 mEq/L Final  . Chloride 06/05/2019 98  96 - 112 mEq/L Final  . CO2 06/05/2019 29  19 - 32 mEq/L Final  . Glucose, Bld 06/05/2019 350* 70 - 99 mg/dL Final  . BUN 06/05/2019 19  6 - 23 mg/dL Final  . Creatinine, Ser 06/05/2019 1.09  0.40 - 1.50 mg/dL Final  . Total Bilirubin 06/05/2019 0.5  0.2 - 1.2 mg/dL Final  . Alkaline Phosphatase 06/05/2019 75  39 - 117 U/L Final  . AST 06/05/2019 17  0 - 37 U/L Final  . ALT 06/05/2019 18  0 - 53 U/L Final  . Total Protein 06/05/2019 7.0  6.0 - 8.3 g/dL Final  . Albumin 06/05/2019 3.8  3.5 - 5.2 g/dL Final  . GFR 06/05/2019 89.81  >60.00 mL/min Final  . Calcium 06/05/2019 9.4  8.4 - 10.5  mg/dL Final  . Hgb A1c MFr Bld 06/05/2019 10.7* 4.6 - 6.5 % Final   Glycemic Control Guidelines for People with Diabetes:Non Diabetic:  <6%Goal of Therapy: <7%Additional Action Suggested:   >8%   . Direct LDL 06/05/2019 85.0  mg/dL Final   Optimal:  <100 mg/dLNear or Above Optimal:  100-129 mg/dLBorderline High:  130-159 mg/dLHigh:  160-189 mg/dLVery High:  >190 mg/dL    Physical Examination:  BP (!) 170/90 (BP Location: Left Arm, Patient Position: Sitting, Cuff Size: Large)   Pulse (!) 101   Ht 5\' 10"  (1.778 m)   Wt 265 lb 12.8 oz (120.6 kg)   SpO2 98%   BMI 38.14 kg/m    ASSESSMENT:  Diabetes type 2, uncontrolled, insulin-dependent   See history of present illness for detailed discussion of current diabetes management, blood sugar patterns and problems identified  His A1c is now 10.7 and higher  He is not monitoring his blood sugar, usually his pump and also not bolusing from his meals most of the time  May have also done better when he was taking Victoza and Metformin   HYPERTENSION: Blood pressure is poorly controlled with benazepril and labetalol  LIPIDS: Well-controlled   PLAN:     Needs to start using the insulin pump consistently every day  Bolus for all meals  Bolus for all high readings  Restart Victoza as before, consider restarting Metformin on the next visit  Needs regular follow-up and will have him come back in about a month for review with his using the pump currently  His FreeStyle meter was programmed to the right date and time  We will periodically check with fingersticks  He will be given a prescription for Contour test strips and prior authorization done as needed  Restart clonidine patch and at the same time reduce benazepril to half tablet    Patient Instructions  Check blood sugars on waking up 7 days a week and at each meal  Also check blood sugars about 2 hours after meals and do this after different meals by rotation  Recommended blood sugar levels on waking up are 90-130 and about 2 hours after meal is 130-160   Bolus at all meals  1/2 benazepril        [name] 06/10/2019, 12:35 PM   Note: This  office note was prepared with Dragon voice recognition system technology. Any transcriptional errors that result from this process are unintentional.

## 2019-06-10 NOTE — Patient Instructions (Addendum)
Check blood sugars on waking up 7 days a week and at each meal  Also check blood sugars about 2 hours after meals and do this after different meals by rotation  Recommended blood sugar levels on waking up are 90-130 and about 2 hours after meal is 130-160   Bolus at all meals  1/2 benazepril

## 2019-06-11 ENCOUNTER — Telehealth: Payer: Self-pay | Admitting: Endocrinology

## 2019-06-11 NOTE — Telephone Encounter (Signed)
ADS calling to check and make sure we received the fax that was sent over on the 26th - said shes tried a couple times.  [phone]

## 2019-06-12 NOTE — Telephone Encounter (Signed)
The fax was received. Paperwork will be completed in accordance with policy.

## 2019-06-23 DIAGNOSIS — Z7189 Other specified counseling: Secondary | ICD-10-CM | POA: Insufficient documentation

## 2019-06-23 NOTE — Progress Notes (Signed)
Cardiology Office Note   Date:  06/24/2019   ID:  Danny Fuller, DOB 12-31-1975, MRN [id number]  PCP:  [name], MD  Cardiologist:   Minus Breeding, MD    Chief Complaint  Patient presents with  . LVH      History of Present Illness: Danny Fuller is a 44 y.o. male who was referred by Dr. Lorrene Reid years ago for evaluation of palpitations.  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 After the last visit he had a cardiac event monitor that demonstrated rare PVCs.  He had an echo with normal LV function although there was some moderate pulmonary HTN.   However, echo last year did not demonstrate pulmonary HTN.     He returns for one year follow up.  Since I last saw him he thinks he is done relatively well.  He ran out of his pravastatin.  He had some trouble with his insulin and so his A1c is 10.7 but prior to that it had been coming down and he been compliant.  He said his blood pressure actually has been running low and for a little while he was taken off his clonidine patch although he started going back up and he is back on this.  He has gained a little weight.  Overall, he feels well.  He is denying any chest pressure, neck or arm discomfort.  He does not notice any palpitations.  He has no shortness of breath, PND or orthopnea.  Has had no weight gain or edema.   Past Medical History:  Diagnosis Date  . Asthma   . Back pain    Bulging Disk  . Chronic kidney disease    sees Dr. Jamal Maes    . Diabetes mellitus    sees Dr. Dwyane Dee   . Diabetic foot ulcers ([place])    sees Dr. Wylene Simmer   . GERD (gastroesophageal reflux disease)   . Headache(784.0)   . Hyperlipidemia   . Hypertension   . Sleep apnea    CPAP    Past Surgical History:  Procedure Laterality Date  . AMPUTATION Left 07/26/2015   Procedure: AMPUTATION LEFT FIFTH RAY;  Surgeon: [name], MD;   Location: [facility];  Service: Orthopedics;  Laterality: Left;  . bilateral hip pins placed    . INCISION AND DRAINAGE PERIRECTAL ABSCESS Right 03/07/2016   Procedure: IRRIGATION AND DEBRIDEMENT PERIRECTAL ABSCESS;  Surgeon: Alphonsa Overall, MD;  Location: [facility];  Service: General;  Laterality: Right;  . INCISION AND DRAINAGE PERIRECTAL ABSCESS Right 03/09/2016   Procedure: EXAM UNDER ANESTHESIA, IRRIGATION AND DEBRIDEMENT PERIRECTAL ABSCESS;  Surgeon: Johnathan Hausen, MD;  Location: [facility];  Service: General;  Laterality: Right;  Open, betadine packed wound     Current Outpatient Medications  Medication Sig Dispense Refill  . benazepril (LOTENSIN) 40 MG tablet Take 1 tablet (40 mg total) by mouth daily. 90 tablet 3  . cloNIDine (CATAPRES - DOSED IN MG/24 HR) 0.1 mg/24hr patch APPLY 1 PATCH(0.1 MG) EXTERNALLY TO THE SKIN 1 TIME A WEEK 4 patch 3  . empagliflozin (JARDIANCE) 25 MG TABS tablet Take 25 mg by mouth daily. 30 tablet 3  . furosemide (LASIX) 20 MG tablet Take 1 tablet (20 mg total) by mouth daily. 90 tablet 3  . glucose blood test strip Use Bayer Contour Next Test strips as instructed to  check blood sugar 4 times daily. 150 each 3  . Insulin Disposable Pump (OMNIPOD DASH 5 PACK PODS) MISC Inject 1 each into the skin continuous. Apply 1 OmniPod insulin pump to body daily for insulin delivery. 30 each 12  . insulin glargine (LANTUS) 100 UNIT/ML injection Inject 60 Units into the skin 2 (two) times daily.    . Insulin Pen Needle (FIFTY50 PEN NEEDLES) 31G X 5 MM MISC Use to inject insulin once daily    . labetalol (NORMODYNE) 100 MG tablet Take 1 tablet (100 mg total) by mouth 2 (two) times daily. TAKE 1 TABLET BY MOUTH TWICE DAILY. 180 tablet 3  . LYRICA 150 MG capsule Take 1 capsule (150 mg total) by mouth 3 (three) times daily. 90 capsule 5  . nortriptyline (PAMELOR) 10 MG capsule TAKE 4 CAPSULES BY MOUTH AT BEDTIME 360 capsule 3  . NOVOLOG 100 UNIT/ML injection INJECT UPTO 170 UNITS VIA PUMP  60 mL 2  . pravastatin (PRAVACHOL) 40 MG tablet Take 1 tablet (40 mg total) by mouth every evening. 90 tablet 3  . traMADol (ULTRAM) 50 MG tablet Take 2 tablets (100 mg total) by mouth every 6 (six) hours as needed for moderate pain. 120 tablet 5  . VICTOZA 18 MG/3ML SOPN Inject 0.2 mLs (1.2 mg total) into the skin daily. Inject once daily at the same time 2 pen 3   No current facility-administered medications for this visit.    Allergies:   Vancomycin   ROS:  Please see the history of present illness.   Otherwise, review of systems are positive for none.   All other systems are reviewed and negative.    PHYSICAL EXAM: VS:  BP 140/73   Pulse 92   Temp (!) 97.2 F (36.2 C)   Ht 5\' 10"  (1.778 m)   Wt 275 lb (124.7 kg)   SpO2 95%   BMI 39.46 kg/m  , BMI Body mass index is 39.46 kg/m.  GENERAL:  Well appearing NECK:  No jugular venous distention, waveform within normal limits, carotid upstroke brisk and symmetric, no bruits, no thyromegaly LUNGS:  Clear to auscultation bilaterally CHEST:  Unremarkable HEART:  PMI not displaced or sustained,S1 and S2 within normal limits, no S3, no S4, no clicks, no rubs, no murmurs ABD:  Flat, positive bowel sounds normal in frequency in pitch, no bruits, no rebound, no guarding, no midline pulsatile mass, no hepatomegaly, no splenomegaly EXT:  2 plus pulses throughout, no edema, no cyanosis no clubbing, he is missing his left fifth digit on his foot  EKG:  EKG is  ordered today. Sinus rhythm, rate 92, RAD, intervals within normal limits, no acute ST-T wave changes.  Recent Labs: 06/05/2019: ALT 18; BUN 19; Creatinine, Ser 1.09; Potassium 4.4; Sodium 133    Lipid Panel    Component Value Date/Time   CHOL 146 06/05/2019 0832   TRIG 226.0 (H) 06/05/2019 0832   HDL 28.10 (L) 06/05/2019 0832   CHOLHDL 5 06/05/2019 0832   VLDL 45.2 (H) 06/05/2019 0832   LDLCALC 78 07/31/2018 1226   LDLDIRECT 85.0 06/05/2019 0832      Wt Readings from Last 3  Encounters:  06/24/19 275 lb (124.7 kg)  06/10/19 265 lb 12.8 oz (120.6 kg)  05/26/19 274 lb (124.3 kg)      Other studies Reviewed: Additional studies/ records that were reviewed today include:  Labs Review of the above records demonstrates:  NA   ASSESSMENT AND PLAN:   PALPITATIONS:  He does not feel these.  No change in therapy.  CHEST PAIN:    He had a negative treadmill test in 2019.  He has had no new symptoms since then.  No further testing.  He needs continued primary risk reduction.   HTN:   The blood pressure is controlled when he takes medicines as listed.  I will leave him on what he is on.  If he has increased pressures in the future he might need increase labetalol.   DM: His blood sugar is being addressed actively.   DYSLIPIDEMIA:   His last LDL was 85 with an HDL 28.  No change in therapy.  SLEEP APNEA: He will need referral to Dr. Claiborne Billings as a new patient as his sleep doctor is no longer in town and he has not had this checked in a long time.  PULMONARY HTN:   There was no evidence of this on echo last year.  He did have left ventricular hypertrophy most likely related to his longstanding uncontrolled hypertension.  I will follow this up with repeat echocardiography in the future.   COVID EDUCATION: He has had his Covid education.   Current medicines are reviewed at length with the patient today.  The patient does not have concerns regarding medicines.  The following changes have been made:   None  Labs/ tests ordered today include:   Orders Placed This Encounter  Procedures  . Ambulatory referral to Sleep Studies  . EKG 12-Lead     Disposition:   FU with me in one year.    Signed, Minus Breeding, MD  06/24/2019 5:22 PM    [facility]

## 2019-06-24 ENCOUNTER — Ambulatory Visit (INDEPENDENT_AMBULATORY_CARE_PROVIDER_SITE_OTHER): Payer: Medicare Other | Admitting: Cardiology

## 2019-06-24 ENCOUNTER — Other Ambulatory Visit: Payer: Self-pay

## 2019-06-24 ENCOUNTER — Encounter: Payer: Self-pay | Admitting: Cardiology

## 2019-06-24 VITALS — BP 140/73 | HR 92 | Temp 97.2°F | Ht 70.0 in | Wt 275.0 lb

## 2019-06-24 DIAGNOSIS — R002 Palpitations: Secondary | ICD-10-CM | POA: Diagnosis not present

## 2019-06-24 DIAGNOSIS — Z7189 Other specified counseling: Secondary | ICD-10-CM

## 2019-06-24 DIAGNOSIS — E785 Hyperlipidemia, unspecified: Secondary | ICD-10-CM

## 2019-06-24 DIAGNOSIS — I10 Essential (primary) hypertension: Secondary | ICD-10-CM

## 2019-06-24 DIAGNOSIS — G4730 Sleep apnea, unspecified: Secondary | ICD-10-CM

## 2019-06-24 DIAGNOSIS — R072 Precordial pain: Secondary | ICD-10-CM | POA: Diagnosis not present

## 2019-06-24 MED ORDER — FUROSEMIDE 20 MG PO TABS: 20.0000 mg | ORAL_TABLET | Freq: Every day | ORAL | 3 refills | Status: DC

## 2019-06-24 MED ORDER — LABETALOL HCL 100 MG PO TABS: 100.0000 mg | ORAL_TABLET | Freq: Two times a day (BID) | ORAL | 3 refills | Status: DC

## 2019-06-24 MED ORDER — BENAZEPRIL HCL 40 MG PO TABS: 40.0000 mg | ORAL_TABLET | Freq: Every day | ORAL | 3 refills | Status: DC

## 2019-06-24 NOTE — Patient Instructions (Signed)
Medication Instructions:  MEDICATIONS RENEWED *If you need a refill on your cardiac medications before your next appointment, please call your pharmacy*  Lab Work: NONE ORDERED THIS VISIT  Testing/Procedures: NONE ORDERED THIS VISIT  Follow-Up: At [facility], you and your health needs are our priority.  As part of our continuing mission to provide you with exceptional heart care, we have created designated Provider Care Teams.  These Care Teams include your primary Cardiologist (physician) and Advanced Practice Providers (APPs -  Physician Assistants and Nurse Practitioners) who all work together to provide you with the care you need, when you need it.  Your next appointment:   12 month(s)  The format for your next appointment:   In Person  Provider:   Minus Breeding, MD   Other Instructions: CALL [phone] TO MAKE AN APPOINTMENT WITH DR. Claiborne Billings

## 2019-07-07 ENCOUNTER — Encounter: Payer: Self-pay | Admitting: Endocrinology

## 2019-07-07 ENCOUNTER — Telehealth: Payer: Self-pay | Admitting: Nutrition

## 2019-07-07 ENCOUNTER — Ambulatory Visit (INDEPENDENT_AMBULATORY_CARE_PROVIDER_SITE_OTHER): Payer: Medicare Other | Admitting: Endocrinology

## 2019-07-07 ENCOUNTER — Other Ambulatory Visit: Payer: Self-pay

## 2019-07-07 VITALS — BP 148/96 | HR 98 | Temp 98.6°F | Ht 70.0 in | Wt 270.5 lb

## 2019-07-07 DIAGNOSIS — I10 Essential (primary) hypertension: Secondary | ICD-10-CM | POA: Diagnosis not present

## 2019-07-07 DIAGNOSIS — E1165 Type 2 diabetes mellitus with hyperglycemia: Secondary | ICD-10-CM

## 2019-07-07 DIAGNOSIS — Z794 Long term (current) use of insulin: Secondary | ICD-10-CM | POA: Diagnosis not present

## 2019-07-07 DIAGNOSIS — E1140 Type 2 diabetes mellitus with diabetic neuropathy, unspecified: Secondary | ICD-10-CM | POA: Diagnosis not present

## 2019-07-07 NOTE — Telephone Encounter (Signed)
Patient was complaining that his PDM would not let him give a bolus when his blood sugar was below 70, and therefor was not documenting the blood sugar readings for Dr. Dwyane Dee to see in the history. I set the lower limit of bolus level to 50, but explained to him that he will need to always treat the low blood sugar first with juice or soda, and then give the bolus.  He reported good understanding of this and promised to treat the low first, then go into the PDM, put the low reading, and give the bolus and eat. Settings were changes per Dr. Ronnie Derby orders. ( Pt. Michela Pitcher he put the original settings into the PDM himself): Basal rate: MN: 2.6, 6AM: 5.5, 10PM, 4.0,  ISF: 30, I/C: 25, timing: 4 hr.s, target: 120 with correction over `140.  Pt. Did this himself and sheet was given to him on how to changes these settings on his own.

## 2019-07-07 NOTE — Progress Notes (Signed)
Patient ID: Danny Fuller, male   DOB: 09-19-1975, 44 y.o.   MRN: [id number]            Reason for Appointment: Follow-up for Type 2 Diabetes and hypertension  History of Present Illness:          Date of diagnosis of type 2 diabetes mellitus: 2007        Background history:   At diagnosis he was relatively asymptomatic and was started on metformin and glipizide He has been on the same oral hypoglycemic regimen for several years. Review of his A1c indicates it has been markedly increased persistently with the lowest reading in 2015 being 9.1  Recent history:   INSULIN regimen is: OMNIPOD pump since 01/05/17  BASAL rate midnight = 3.0, 6:30 AM = 6.0  Carbohydrate ratio 1: 30 at midnight, 15 at 6 AM and 30 at 6 PM , sensitivity 1: 15 at midnight, 1: 20 between 6 AM and 6:30 PM and 1: 28 after 6:30 PM Targets 120 in the daytime, 1 6:40 PM and 100 at midnight Active insulin 3 hours  Non-insulin hypoglycemic drugs the patient is taking are:?  Metformin ER 2000 mg daily, Jardiance 25 mg daily   His A1c is last higher at 10.7  He is here for short-term follow-up  Current management, blood sugar patterns and problems identified:  He still has difficulty with getting his OmniPod supplies consistently as the pharmacy will not fill the prescription until he is on the last one  Otherwise blood sugars are highly variable and difficult to get any consistent pattern  On some days he does however have excellent blood sugars throughout the day and night  He has observed that the pump is not allowing him to do boluses when the blood sugars are normal and not clear why  Also his bolus settings are very different at different times of the day and not standardized  He has started back on Victoza  Hypoglycemia has occurred occasionally midday for no apparent reason was low throughout the night last night  He says that he is having decreased appetite recently because of back pain  His  weight is fluctuating  He is still not using his freestyle libre consistently especially monitoring a reading after dinner        Side effects from medications have been: None  Compliance with the medical regimen: Fair  Glucose monitoring:  with freestyle meter on the Omnipod and libre   CONTINUOUS GLUCOSE MONITORING RECORD INTERPRETATION    Dates of Recording: Last 2 weeks  Sensor description: Elenor Legato  Results statistics:   CGM use % of time  64  Average and SD  172, GV 45  Time in range    53    %  % Time Above 180  22  % Time above 250  18  % Time Below target  7    PRE-MEAL Fasting Lunch Dinner Bedtime Overall  Glucose range:       Mean/median:  175  195  169  130  172   POST-MEAL PC Breakfast PC Lunch PC Dinner  Glucose range:     Mean/median:  213  134  152    Glycemic patterns summary: Blood sugars are highly variable with gradual rise in blood sugar after about 4 AM until 12 noon and then gradually declining progressively until late evening.  Blood sugar patterns incomplete with pain 10 PM and 2 AM  Hyperglycemic episodes occurring at all different times  but not consistently  Hypoglycemic episodes occurred mostly overnight last night and occasionally midday  Overnight periods: Blood sugar data is incomplete but usually blood sugars are low normal early part of the night and then gradually rising on some days.  Blood sugars were persistently low last night but persistently high on last Saturday night  Preprandial periods: Difficult to analyze since boluses are inconsistent and not clear what time he is actually consuming carbohydrates  Postprandial periods:   After breakfast:   Blood sugars are generally rising even before the meal and continue to rise still about 11 AM After lunch:   Blood sugars are highly variable with no consistent pattern After dinner: Mealtimes are variable but blood sugars are generally coming down after about 8 PM   Previous  data:  PRE-MEAL Fasting Lunch Dinner Bedtime Overall  Glucose range:       Mean/median:  234  179  181  204  206      Self-care: Meal times are:  Breakfast is at 9-10 am, Lunch-1 PM : Dinner: 8 pm   Typical meal intake: Breakfast is Usually toast and boiled eggs.  Usually getting cold cut sandwiches at lunch, grilled chicken and vegetables/potatoes at dinner.  Snacks are usually chips, crackers                Dietician visit, most recent: 05/02/16               Exercise:  Minimal walking, limited by pain  Weight history: His highest weight in the past has been 378    Wt Readings from Last 3 Encounters:  07/07/19 270 lb 8 oz (122.7 kg)  06/24/19 275 lb (124.7 kg)  06/10/19 265 lb 12.8 oz (120.6 kg)    Glycemic control:   Lab Results  Component Value Date   HGBA1C 10.7 (H) 06/05/2019   HGBA1C 8.8 (H) 11/04/2018   HGBA1C 12.8 (A) 07/31/2018   Lab Results  Component Value Date   MICROALBUR 128.3 (H) 06/05/2019   LDLCALC 78 07/31/2018   CREATININE 1.09 06/05/2019   Lab Results  Component Value Date   MICRALBCREAT 186.4 (H) 06/05/2019    Lab Results  Component Value Date   FRUCTOSAMINE 284 09/30/2018   FRUCTOSAMINE 565 (H) 05/10/2018   FRUCTOSAMINE 437 (H) 04/15/2018      Allergies as of 07/07/2019      Reactions   Vancomycin Other (See Comments)   AKI      Medication List       Accurate as of Jul 07, 2019 11:57 AM. If you have any questions, ask your nurse or doctor.        STOP taking these medications   insulin glargine 100 UNIT/ML injection Commonly known as: LANTUS Stopped by: [name], MD     TAKE these medications   benazepril 40 MG tablet Commonly known as: LOTENSIN Take 1 tablet (40 mg total) by mouth daily.   cloNIDine 0.1 mg/24hr patch Commonly known as: CATAPRES - Dosed in mg/24 hr APPLY 1 PATCH(0.1 MG) EXTERNALLY TO THE SKIN 1 TIME A WEEK   empagliflozin 25 MG Tabs tablet Commonly known as: Jardiance Take 25 mg by mouth  daily.   Fifty50 Pen Needles 31G X 5 MM Misc Generic drug: Insulin Pen Needle Use to inject insulin once daily   furosemide 20 MG tablet Commonly known as: LASIX Take 1 tablet (20 mg total) by mouth daily.   glucose blood test strip Use Bayer Contour Next Test strips  as instructed to check blood sugar 4 times daily.   labetalol 100 MG tablet Commonly known as: NORMODYNE Take 1 tablet (100 mg total) by mouth 2 (two) times daily. TAKE 1 TABLET BY MOUTH TWICE DAILY.   Lyrica 150 MG capsule Generic drug: pregabalin Take 1 capsule (150 mg total) by mouth 3 (three) times daily.   nortriptyline 10 MG capsule Commonly known as: PAMELOR TAKE 4 CAPSULES BY MOUTH AT BEDTIME   NovoLOG 100 UNIT/ML injection Generic drug: insulin aspart INJECT UPTO 170 UNITS VIA PUMP   OmniPod Dash 5 Pack Pods Misc Inject 1 each into the skin continuous. Apply 1 OmniPod insulin pump to body daily for insulin delivery.   pravastatin 40 MG tablet Commonly known as: PRAVACHOL Take 1 tablet (40 mg total) by mouth every evening.   traMADol 50 MG tablet Commonly known as: ULTRAM Take 2 tablets (100 mg total) by mouth every 6 (six) hours as needed for moderate pain.   Victoza 18 MG/3ML Sopn Generic drug: liraglutide Inject 0.2 mLs (1.2 mg total) into the skin daily. Inject once daily at the same time       Allergies:  Allergies  Allergen Reactions  . Vancomycin Other (See Comments)    AKI    Past Medical History:  Diagnosis Date  . Asthma   . Back pain    Bulging Disk  . Chronic kidney disease    sees Dr. Jamal Maes    . Diabetes mellitus    sees Dr. Dwyane Dee   . Diabetic foot ulcers ([place])    sees Dr. Wylene Simmer   . GERD (gastroesophageal reflux disease)   . Headache(784.0)   . Hyperlipidemia   . Hypertension   . Sleep apnea    CPAP    Past Surgical History:  Procedure Laterality Date  . AMPUTATION Left 07/26/2015   Procedure: AMPUTATION LEFT FIFTH RAY;  Surgeon: [name],  MD;  Location: [facility];  Service: Orthopedics;  Laterality: Left;  . bilateral hip pins placed    . INCISION AND DRAINAGE PERIRECTAL ABSCESS Right 03/07/2016   Procedure: IRRIGATION AND DEBRIDEMENT PERIRECTAL ABSCESS;  Surgeon: Alphonsa Overall, MD;  Location: [facility];  Service: General;  Laterality: Right;  . INCISION AND DRAINAGE PERIRECTAL ABSCESS Right 03/09/2016   Procedure: EXAM UNDER ANESTHESIA, IRRIGATION AND DEBRIDEMENT PERIRECTAL ABSCESS;  Surgeon: Johnathan Hausen, MD;  Location: [facility];  Service: General;  Laterality: Right;  Open, betadine packed wound    Family History  Problem Relation Age of Onset  . Arthritis Other   . Diabetes Other   . Hypertension Other   . Hyperlipidemia Other   . Stroke Other   . Sudden death Other   . Diabetes Mother   . Diabetes Sister   . Diabetes Brother   . Heart attack Father        Died ate 76, couple of "heart attacks"     Social History:  reports that he quit smoking about 3 years ago. His smoking use included cigarettes. He has a 10.00 pack-year smoking history. He has never used smokeless tobacco. He reports that he does not drink alcohol or use drugs.   Review of Systems   Lipid history: LDL has been below 100, mild increase in triglycerides present Has been started on pravastatin by cardiologist    Lab Results  Component Value Date   CHOL 146 06/05/2019   HDL 28.10 (L) 06/05/2019   LDLCALC 78 07/31/2018   LDLDIRECT 85.0 06/05/2019   TRIG  226.0 (H) 06/05/2019   CHOLHDL 5 06/05/2019           Hypertension: On treatment for several years  Currently he is taking benazepril 20 mg and has gone back to the 0.1 mg clonidine patch as prescribed Also on labetalol 100 mg twice daily Blood pressure appears to be inconsistent, was better with cardiologist  Home BP 145/87 yesterday  Blood pressure was checked twice today, initial reading 160/100 with large cuff  BP Readings from Last 3 Encounters:  07/07/19 (!) 148/96  06/24/19 140/73   06/10/19 (!) 170/90    EDEMA: He has history of leg edema However this is mostly on the left leg and more in the evening  He is not taking Lasix usually    Lab Results  Component Value Date   CREATININE 1.09 06/05/2019   BUN 19 06/05/2019   NA 133 (L) 06/05/2019   K 4.4 06/05/2019   CL 98 06/05/2019   CO2 29 06/05/2019    Neuropathy: He has sharp pains in his feet from neuropathy,  on Lyrica 150 mg  Followed by neurologist Also on nortriptyline at bedtime, now taking 40 mg Duloxetine may not have helped previously   He was recommended diabetic shoes by podiatrist   LABS:  No visits with results within 1 Week(s) from this visit.  Latest known visit with results is:  Lab on 06/05/2019  Component Date Value Ref Range Status  . Cholesterol 06/05/2019 146  0 - 200 mg/dL Final   ATP III Classification       Desirable:  < 200 mg/dL               Borderline High:  200 - 239 mg/dL          High:  > = 240 mg/dL  . Triglycerides 06/05/2019 226.0* 0.0 - 149.0 mg/dL Final   Normal:  <150 mg/dLBorderline High:  150 - 199 mg/dL  . HDL 06/05/2019 28.10* >39.00 mg/dL Final  . VLDL 06/05/2019 45.2* 0.0 - 40.0 mg/dL Final  . Total CHOL/HDL Ratio 06/05/2019 5   Final                  Men          Women1/2 Average Risk     3.4          3.3Average Risk          5.0          4.42X Average Risk          9.6          7.13X Average Risk          15.0          11.0                      . NonHDL 06/05/2019 117.44   Final   NOTE:  Non-HDL goal should be 30 mg/dL higher than patient's LDL goal (i.e. LDL goal of < 70 mg/dL, would have non-HDL goal of < 100 mg/dL)  . Microalb, Ur 06/05/2019 128.3* 0.0 - 1.9 mg/dL Final  . Creatinine,U 06/05/2019 68.8  mg/dL Final  . Microalb Creat Ratio 06/05/2019 186.4* 0.0 - 30.0 mg/g Final  . Sodium 06/05/2019 133* 135 - 145 mEq/L Final  . Potassium 06/05/2019 4.4  3.5 - 5.1 mEq/L Final  . Chloride 06/05/2019 98  96 - 112 mEq/L Final  . CO2 06/05/2019 29  19 -  32  mEq/L Final  . Glucose, Bld 06/05/2019 350* 70 - 99 mg/dL Final  . BUN 06/05/2019 19  6 - 23 mg/dL Final  . Creatinine, Ser 06/05/2019 1.09  0.40 - 1.50 mg/dL Final  . Total Bilirubin 06/05/2019 0.5  0.2 - 1.2 mg/dL Final  . Alkaline Phosphatase 06/05/2019 75  39 - 117 U/L Final  . AST 06/05/2019 17  0 - 37 U/L Final  . ALT 06/05/2019 18  0 - 53 U/L Final  . Total Protein 06/05/2019 7.0  6.0 - 8.3 g/dL Final  . Albumin 06/05/2019 3.8  3.5 - 5.2 g/dL Final  . GFR 06/05/2019 89.81  >60.00 mL/min Final  . Calcium 06/05/2019 9.4  8.4 - 10.5 mg/dL Final  . Hgb A1c MFr Bld 06/05/2019 10.7* 4.6 - 6.5 % Final   Glycemic Control Guidelines for People with Diabetes:Non Diabetic:  <6%Goal of Therapy: <7%Additional Action Suggested:  >8%   . Direct LDL 06/05/2019 85.0  mg/dL Final   Optimal:  <100 mg/dLNear or Above Optimal:  100-129 mg/dLBorderline High:  130-159 mg/dLHigh:  160-189 mg/dLVery High:  >190 mg/dL    Physical Examination:  BP (!) 148/96   Pulse 98   Temp 98.6 F (37 C) (Oral)   Ht 5\' 10"  (1.778 m)   Wt 270 lb 8 oz (122.7 kg)   SpO2 99%   BMI 38.81 kg/m    ASSESSMENT:  Diabetes type 2, uncontrolled, insulin-dependent   See history of present illness for detailed discussion of current diabetes management, blood sugar patterns and problems identified  His A1c is last 10.7 and higher  He is using the OmniPod insulin pump along with the freestyle libre  Also now taking Victoza, Jardiance 25 mg Not clear why he is not getting his pump supplies consistently as he has been prescribed a change in his pod every day He will periodically run out of supplies or have difficulty with the pump sticking With this his blood sugars will be much higher the next day However otherwise may sometimes have periods of significant hyperglycemia also, highest in the middle of the day Currently having some tendency to low normal readings midday and rarely overnight Also not monitoring his  blood sugars are using the freestyle libre consistently Only 64% of the blood sugar data is available on the CGM  HYPERTENSION: Blood pressure is still not well controlled with benazepril, clonidine patch and labetalol He is being followed by his PCP and cardiologist also Currently not on a diuretic and benazepril was reduced as a precaution when starting the Catapres patch  Microalbuminuria on ACE inhibitor  PLAN:     He will see the nurse educator regarding technical problems that he is having with his pump and bolusing  Need to use the pump every day and use the adhesive to help it stick  Bolus for all meals by entering carbohydrate amounts and also do correction boluses  Change correction factor to 1: 30 all around to avoid any tendency to hypoglycemia  Carbohydrate coverage 1: 25 consistently  BASAL settings: Midnight = 2.6, 6 AM = 5.5 and 10 PM = 4.0  Bolus for all high readings  Blood sugar target is 120, correction threshold 140 and active insulin 4 hours  Treat hypoglycemia appropriately and bolus after correcting the low blood sugar this happened at mealtime  He needs to pick up the prescription for Contour test strips and prior authorization will be done as needed  Continue clonidine patch and can go up to  the full tablet of benazepril for now; this will help her microalbumin also  Consider adding a diuretic  Follow-up in 2 months    Patient Instructions  Bolus for all carbs  40 mg Benazepril      [name] 07/07/2019, 11:57 AM   Note: This office note was prepared with Dragon voice recognition system technology. Any transcriptional errors that result from this process are unintentional.

## 2019-07-07 NOTE — Patient Instructions (Addendum)
Bolus for all carbs  40 mg Benazepril

## 2019-07-07 NOTE — Telephone Encounter (Signed)
He was given a prescription for the Contour test strips on his last visit and he will be checking with the pharmacy.  His OmniPod prescription is for 1 pod every day already

## 2019-07-08 ENCOUNTER — Other Ambulatory Visit: Payer: Self-pay

## 2019-07-08 MED ORDER — GLUCOSE BLOOD VI STRP: ORAL_STRIP | 3 refills | Status: DC

## 2019-07-08 NOTE — Telephone Encounter (Signed)
I calculated from his new settings that we changed yesterday, and he should be ok with the pods at 1per day.  I also gave him the bayer contour number to call to get help with coverage.  But he says he dose not have the script. Can you please call that in to [facility] on [address]

## 2019-07-08 NOTE — Telephone Encounter (Signed)
Prescription has been sent.

## 2019-07-17 ENCOUNTER — Encounter: Payer: Medicare Other | Admitting: Family Medicine

## 2019-07-19 ENCOUNTER — Telehealth: Payer: Self-pay | Admitting: *Deleted

## 2019-07-19 DIAGNOSIS — G4730 Sleep apnea, unspecified: Secondary | ICD-10-CM

## 2019-07-19 NOTE — Telephone Encounter (Signed)
-----   Message ----- From: Freada Bergeron, CMA Sent: 07/19/2019   5:16 PM EDT To: Freada Bergeron, CMA   ----- Message ----- From: Roland Earl Sent: 06/25/2019   7:35 AM EDT To: [name], RN, Freada Bergeron, CMA, *  Sleep Study split night

## 2019-07-22 ENCOUNTER — Telehealth: Payer: Self-pay

## 2019-07-22 NOTE — Telephone Encounter (Signed)
FAXED [place] [facility] (faxed on 07/21/19) Document: [facility] for diabetic test strips Other records requested: none at this time.  All above requested information has been faxed successfully to [employer] listed above. Documents and fax confirmation have been placed in the faxed file for future reference.

## 2019-07-22 NOTE — Telephone Encounter (Signed)
Opened in error

## 2019-07-23 ENCOUNTER — Telehealth: Payer: Self-pay | Admitting: *Deleted

## 2019-07-23 NOTE — Telephone Encounter (Signed)
Staff message sent to Danny Fuller to schedule appointment and ask that it be read by Dr Claiborne Billings. No PA is required. Patient has medicare.

## 2019-07-28 ENCOUNTER — Ambulatory Visit (INDEPENDENT_AMBULATORY_CARE_PROVIDER_SITE_OTHER): Payer: Medicare Other | Admitting: Family Medicine

## 2019-07-28 ENCOUNTER — Encounter: Payer: Self-pay | Admitting: Family Medicine

## 2019-07-28 ENCOUNTER — Other Ambulatory Visit (INDEPENDENT_AMBULATORY_CARE_PROVIDER_SITE_OTHER): Payer: Medicare Other

## 2019-07-28 ENCOUNTER — Other Ambulatory Visit: Payer: Self-pay

## 2019-07-28 VITALS — BP 160/70 | HR 105 | Temp 97.3°F | Ht 70.0 in | Wt 273.8 lb

## 2019-07-28 DIAGNOSIS — Z0000 Encounter for general adult medical examination without abnormal findings: Secondary | ICD-10-CM

## 2019-07-28 DIAGNOSIS — I10 Essential (primary) hypertension: Secondary | ICD-10-CM

## 2019-07-28 LAB — CBC WITH DIFFERENTIAL/PLATELET
Basophils Absolute: 0.1 10*3/uL (ref 0.0–0.1)
Basophils Relative: 0.9 % (ref 0.0–3.0)
Eosinophils Absolute: 0.1 10*3/uL (ref 0.0–0.7)
Eosinophils Relative: 0.7 % (ref 0.0–5.0)
HCT: 44.6 % (ref 39.0–52.0)
Hemoglobin: 15.5 g/dL (ref 13.0–17.0)
Lymphocytes Relative: 30.1 % (ref 12.0–46.0)
Lymphs Abs: 3 10*3/uL (ref 0.7–4.0)
MCHC: 34.7 g/dL (ref 30.0–36.0)
MCV: 90.6 fl (ref 78.0–100.0)
Monocytes Absolute: 0.9 10*3/uL (ref 0.1–1.0)
Monocytes Relative: 8.6 % (ref 3.0–12.0)
Neutro Abs: 6 10*3/uL (ref 1.4–7.7)
Neutrophils Relative %: 59.7 % (ref 43.0–77.0)
Platelets: 303 10*3/uL (ref 150.0–400.0)
RBC: 4.92 Mil/uL (ref 4.22–5.81)
RDW: 13.5 % (ref 11.5–15.5)
WBC: 10.1 10*3/uL (ref 4.0–10.5)

## 2019-07-28 LAB — PSA: PSA: 0.56 ng/mL (ref 0.10–4.00)

## 2019-07-28 MED ORDER — METHOCARBAMOL 500 MG PO TABS: 500.0000 mg | ORAL_TABLET | Freq: Four times a day (QID) | ORAL | 5 refills | Status: DC | PRN

## 2019-07-28 MED ORDER — PRAVASTATIN SODIUM 40 MG PO TABS: 40.0000 mg | ORAL_TABLET | Freq: Every evening | ORAL | 3 refills | Status: DC

## 2019-07-28 NOTE — Progress Notes (Signed)
Subjective:    Patient ID: Danny Fuller, male    DOB: 22-May-1975, 44 y.o.   MRN: [id number]  HPI Here for a well exam. He feels fine except for an intermittent pain in the middle back that started a month ago. No recent trauma. It feels like muscle spasms. Otherwise he keeps up with Dr. Dwyane Dee, Dr. Percival Spanish ,and Dr. Lorrene Reid. He had labs done in April showing an A1c of 10.2, TG of 226, LDL of 85, and creatinine of 1.09. His BP has been stable.    Review of Systems  Constitutional: Negative.   HENT: Negative.   Eyes: Negative.   Respiratory: Negative.   Cardiovascular: Negative.   Gastrointestinal: Negative.   Genitourinary: Negative.   Musculoskeletal: Positive for back pain.  Skin: Negative.   Neurological: Negative.   Psychiatric/Behavioral: Negative.        Objective:   Physical Exam Constitutional:      General: He is not in acute distress.    Appearance: He is well-developed. He is obese. He is not diaphoretic.  HENT:     Head: Normocephalic and atraumatic.     Right Ear: External ear normal.     Left Ear: External ear normal.     Nose: Nose normal.     Mouth/Throat:     Pharynx: No oropharyngeal exudate.  Eyes:     General: No scleral icterus.       Right eye: No discharge.        Left eye: No discharge.     Conjunctiva/sclera: Conjunctivae normal.     Pupils: Pupils are equal, round, and reactive to light.  Neck:     Thyroid: No thyromegaly.     Vascular: No JVD.     Trachea: No tracheal deviation.  Cardiovascular:     Rate and Rhythm: Normal rate and regular rhythm.     Heart sounds: Normal heart sounds. No murmur heard.  No friction rub. No gallop.   Pulmonary:     Effort: Pulmonary effort is normal. No respiratory distress.     Breath sounds: Normal breath sounds. No wheezing or rales.  Chest:     Chest wall: No tenderness.  Abdominal:     General: Bowel sounds are normal. There is no distension.     Palpations: Abdomen is soft. There is no mass.       Tenderness: There is no abdominal tenderness. There is no guarding or rebound.  Genitourinary:    Penis: Normal. No tenderness.      Testes: Normal.     Prostate: Normal.     Rectum: Normal. Guaiac result negative.  Musculoskeletal:        General: Normal range of motion.     Cervical back: Neck supple.     Comments: Mildly tender to the right and the left of the middle back, full ROM   Lymphadenopathy:     Cervical: No cervical adenopathy.  Skin:    General: Skin is warm and dry.     Coloration: Skin is not pale.     Findings: No erythema or rash.  Neurological:     Mental Status: He is alert and oriented to person, place, and time.     Cranial Nerves: No cranial nerve deficit.     Motor: No abnormal muscle tone.     Coordination: Coordination normal.     Deep Tendon Reflexes: Reflexes are normal and symmetric. Reflexes normal.  Psychiatric:        Behavior:  Behavior normal.        Thought Content: Thought content normal.        Judgment: Judgment normal.           Assessment & Plan:  Well exam. We discussed diet and exercise. Check a CBC and PSA today. For the back spasms, he will try Robaxin prn. We discussed diet and exercise.  [name], MD

## 2019-07-30 NOTE — Telephone Encounter (Addendum)
RE: split night Danny Fuller, CMA  Danny Fuller, CMA Please schedule this for Dr Claiborne Billings to read. No PA is required. Thanks!        Patient is scheduled for lab study on 08/16/19. pt is scheduled for COVID screening on 08/14/19 prior to ss..  Patient understands his sleep study will be done at [facility] sleep lab. Patient understands he will receive a sleep packet in a week or so. Patient understands to call if he does not receive the sleep packet in a timely manner. Patient agrees with treatment and thanked me for call.

## 2019-08-14 ENCOUNTER — Other Ambulatory Visit (HOSPITAL_COMMUNITY): Payer: Medicare Other

## 2019-08-16 ENCOUNTER — Encounter (HOSPITAL_BASED_OUTPATIENT_CLINIC_OR_DEPARTMENT_OTHER): Payer: Medicare Other | Admitting: Cardiovascular Disease

## 2019-09-03 ENCOUNTER — Ambulatory Visit (HOSPITAL_BASED_OUTPATIENT_CLINIC_OR_DEPARTMENT_OTHER): Payer: Medicare Other | Attending: Cardiology | Admitting: Cardiovascular Disease

## 2019-09-04 ENCOUNTER — Other Ambulatory Visit: Payer: Medicare Other

## 2019-09-10 ENCOUNTER — Ambulatory Visit: Payer: Medicare Other | Admitting: Endocrinology

## 2019-09-18 ENCOUNTER — Telehealth: Payer: Self-pay

## 2019-09-18 NOTE — Telephone Encounter (Signed)
FAXED [place]: ADS  Document: Order for [facility] and pump supplies, #3 corrected Other records requested: None requested  All above requested information has been faxed successfully to [employer] listed above. Documents and fax confirmation have been placed in the faxed file for future reference.

## 2019-09-18 NOTE — Telephone Encounter (Signed)
FAXED [name]: ADS  Document: Order for Danny Fuller and Pump supplies Other records requested: None requested  All above requested information has been faxed successfully to [employer] listed above. Documents and fax confirmation have been placed in the faxed file for future reference.

## 2019-09-22 DIAGNOSIS — Z89422 Acquired absence of other left toe(s): Secondary | ICD-10-CM | POA: Diagnosis not present

## 2019-09-22 DIAGNOSIS — E11621 Type 2 diabetes mellitus with foot ulcer: Secondary | ICD-10-CM | POA: Diagnosis not present

## 2019-10-09 DIAGNOSIS — Z89422 Acquired absence of other left toe(s): Secondary | ICD-10-CM | POA: Diagnosis not present

## 2019-10-09 DIAGNOSIS — E11621 Type 2 diabetes mellitus with foot ulcer: Secondary | ICD-10-CM | POA: Diagnosis not present

## 2019-10-13 ENCOUNTER — Other Ambulatory Visit: Payer: Self-pay

## 2019-10-13 ENCOUNTER — Encounter: Payer: Medicare Other | Admitting: Endocrinology

## 2019-10-13 NOTE — Progress Notes (Signed)
Erroneous encounter

## 2019-10-14 NOTE — Progress Notes (Signed)
This encounter was created in error - please disregard.

## 2019-10-23 DIAGNOSIS — H401131 Primary open-angle glaucoma, bilateral, mild stage: Secondary | ICD-10-CM | POA: Diagnosis not present

## 2019-10-24 DIAGNOSIS — E11621 Type 2 diabetes mellitus with foot ulcer: Secondary | ICD-10-CM | POA: Insufficient documentation

## 2019-10-24 DIAGNOSIS — E1140 Type 2 diabetes mellitus with diabetic neuropathy, unspecified: Secondary | ICD-10-CM | POA: Diagnosis not present

## 2019-10-24 DIAGNOSIS — M79672 Pain in left foot: Secondary | ICD-10-CM | POA: Diagnosis not present

## 2019-10-29 ENCOUNTER — Other Ambulatory Visit: Payer: Self-pay

## 2019-10-29 ENCOUNTER — Other Ambulatory Visit (INDEPENDENT_AMBULATORY_CARE_PROVIDER_SITE_OTHER): Payer: Medicare Other

## 2019-10-29 DIAGNOSIS — E1165 Type 2 diabetes mellitus with hyperglycemia: Secondary | ICD-10-CM

## 2019-10-29 DIAGNOSIS — Z794 Long term (current) use of insulin: Secondary | ICD-10-CM

## 2019-10-29 LAB — BASIC METABOLIC PANEL
BUN: 16 mg/dL (ref 6–23)
CO2: 23 mEq/L (ref 19–32)
Calcium: 9 mg/dL (ref 8.4–10.5)
Chloride: 97 mEq/L (ref 96–112)
Creatinine, Ser: 1.3 mg/dL (ref 0.40–1.50)
GFR: 73.15 mL/min (ref >60.00)
Glucose, Bld: 417 mg/dL — ABNORMAL HIGH (ref 70–99)
Potassium: 4.5 mEq/L (ref 3.5–5.1)
Sodium: 129 mEq/L — ABNORMAL LOW (ref 135–145)

## 2019-10-30 ENCOUNTER — Encounter: Payer: Self-pay | Admitting: Family Medicine

## 2019-10-30 ENCOUNTER — Ambulatory Visit (INDEPENDENT_AMBULATORY_CARE_PROVIDER_SITE_OTHER): Payer: Medicare Other | Admitting: Family Medicine

## 2019-10-30 VITALS — BP 142/78 | HR 106 | Temp 98.7°F | Ht 70.0 in | Wt 265.4 lb

## 2019-10-30 DIAGNOSIS — L03116 Cellulitis of left lower limb: Secondary | ICD-10-CM | POA: Diagnosis not present

## 2019-10-30 DIAGNOSIS — Z794 Long term (current) use of insulin: Secondary | ICD-10-CM | POA: Diagnosis not present

## 2019-10-30 DIAGNOSIS — E1140 Type 2 diabetes mellitus with diabetic neuropathy, unspecified: Secondary | ICD-10-CM

## 2019-10-30 LAB — HEMOGLOBIN A1C: Hgb A1c MFr Bld: 11.7 % — ABNORMAL HIGH (ref 4.6–6.5)

## 2019-10-30 MED ORDER — LEVOFLOXACIN 500 MG PO TABS: 500.0000 mg | ORAL_TABLET | Freq: Every day | ORAL | 0 refills | Status: AC

## 2019-10-30 MED ORDER — CEFTRIAXONE SODIUM 1 G IJ SOLR
1.0000 g | Freq: Once | INTRAMUSCULAR | Status: AC
  Administered 2019-10-30: 1 g via INTRAMUSCULAR

## 2019-10-30 MED ORDER — AMOXICILLIN-POT CLAVULANATE 875-125 MG PO TABS: 1.0000 | ORAL_TABLET | Freq: Two times a day (BID) | ORAL | 0 refills | Status: DC

## 2019-10-30 NOTE — Addendum Note (Signed)
Addended by: Matilde Sprang on: 10/30/2019 09:57 AM   Modules accepted: Orders

## 2019-10-30 NOTE — Progress Notes (Signed)
° °  Subjective:    Patient ID: Danny Fuller, male    DOB: May 20, 1975, 44 y.o.   MRN: [id number]  HPI Here for infection in the left foot. He sees Dr. Dwyane Dee for his diabetes, and he will see him again next Monday. He had an A1c drawn yesterday which is pending. His last A1c in April was 10.7, so obviously he has not been well controlled. He has a chronic callus on the bottom of the left foot, and a few weeks ago he noticed it began to drain some green colored fluid. Since then the foot and lower leg has been swelling, it feels warm, and it has been painful. No fever. He saw Dr. Doran Durand on 10-24-19 and he was worried about possible osteomyelitis. He has ordered an MRI which is pending. He started Rod on Cephalexin 500 mg QID, but this has not been helping much.    Review of Systems  Constitutional: Negative.   Respiratory: Negative.   Cardiovascular: Positive for leg swelling. Negative for chest pain and palpitations.  Skin: Positive for wound.       Objective:   Physical Exam Constitutional:      Appearance: Normal appearance.  Cardiovascular:     Rate and Rhythm: Normal rate and regular rhythm.     Pulses: Normal pulses.     Heart sounds: Normal heart sounds.  Pulmonary:     Effort: Pulmonary effort is normal.     Breath sounds: Normal breath sounds.  Skin:    Comments: The left lower leg and ankle and foot are swollen, warm , and tender. The callus over the 4th metatarsal head is draining some yellowish fluid    Neurological:     Mental Status: He is alert.           Assessment & Plan:  He has cellulitis of the left foot, and possibly osteomyelitis. He will follow up with Dr. Doran Durand next week. We will stop the Cephalexin and start him on both Augmentin BID and Levaquin daily for 14 days. He is given a shot of Rocephin today. His diabetes is not well controlled and he will see Dr. Dwyane Dee next week.  [name], MD

## 2019-11-03 ENCOUNTER — Ambulatory Visit (INDEPENDENT_AMBULATORY_CARE_PROVIDER_SITE_OTHER): Payer: Medicare Other | Admitting: Endocrinology

## 2019-11-03 ENCOUNTER — Other Ambulatory Visit: Payer: Self-pay

## 2019-11-03 VITALS — BP 152/90 | HR 94 | Ht 70.0 in | Wt 265.0 lb

## 2019-11-03 DIAGNOSIS — Z794 Long term (current) use of insulin: Secondary | ICD-10-CM

## 2019-11-03 DIAGNOSIS — E1165 Type 2 diabetes mellitus with hyperglycemia: Secondary | ICD-10-CM | POA: Diagnosis not present

## 2019-11-03 DIAGNOSIS — I10 Essential (primary) hypertension: Secondary | ICD-10-CM

## 2019-11-03 MED ORDER — LABETALOL HCL 100 MG PO TABS
100.0000 mg | ORAL_TABLET | Freq: Two times a day (BID) | ORAL | 3 refills | Status: DC
Start: 2019-11-03 — End: 2020-12-31

## 2019-11-03 MED ORDER — HUMULIN R U-500 (CONCENTRATED) 500 UNIT/ML ~~LOC~~ SOLN: SUBCUTANEOUS | 1 refills | Status: DC

## 2019-11-03 MED ORDER — VICTOZA 18 MG/3ML ~~LOC~~ SOPN
1.2000 mg | PEN_INJECTOR | Freq: Every day | SUBCUTANEOUS | 0 refills | Status: DC
Start: 2019-11-03 — End: 2019-12-15

## 2019-11-03 MED ORDER — EMPAGLIFLOZIN 25 MG PO TABS
25.0000 mg | ORAL_TABLET | Freq: Every day | ORAL | 3 refills | Status: DC
Start: 2019-11-03 — End: 2020-02-04

## 2019-11-03 NOTE — Patient Instructions (Addendum)
Labetalol 200mg  twice daily  Use the following settings for the U-500 concentrated regular insulin, only fill up the pump with a minimum volume required  Basal rate 12 AM-5 AM = 0.40.  5 AM-9 PM = 1.3, 9 PM-12 AM = 0.6  Insulin to carb ratio 1:40 Sensitivity/ISF: 1: 60 Active insulin time 6 hours Same target range

## 2019-11-03 NOTE — Addendum Note (Signed)
Addended by: Amado Coe on: 11/03/2019 02:29 PM   Modules accepted: Orders

## 2019-11-03 NOTE — Progress Notes (Signed)
Patient ID: Danny Fuller, male   DOB: Apr 02, 1975, 44 y.o.   MRN: [id number]            Reason for Appointment: Follow-up for Type 2 Diabetes and hypertension  History of Present Illness:          Date of diagnosis of type 2 diabetes mellitus: 2007        Background history:   At diagnosis he was relatively asymptomatic and was started on metformin and glipizide He has been on the same oral hypoglycemic regimen for several years. Review of his A1c indicates it has been markedly increased persistently with the lowest reading in 2015 being 9.1  Recent history:   INSULIN regimen is: OMNIPOD pump since 01/05/17  BASAL rate midnight = 2.6, 6:30 AM = 5.5  Carbohydrate ratio 1: 25 Correction factor I: 30 Targets 120-140 Active insulin 4 hours  Non-insulin hypoglycemic drugs the patient is taking are: Jardiance 25 mg daily, Victoza 1.2 mg daily  His A1c is higher than usual at 11.7   Current management, blood sugar patterns and problems identified:  He still has difficulty with getting his OmniPod pump to stick consistently and he did not let us know  Again some of his hyperglycemia is from intermittently putting on his pump but not giving himself injections in between to control his sugars  Overall has significant variability in his blood sugars at all different times  However he has only used his freestyle libre about half the time with not monitoring blood sugars consistently throughout the day and no readings between the 16th and 19th  He says that he has had occasional low blood sugars but only see a couple of episodes of early morning  His weight has come down slightly possibly from [place]  He says he is not eating much because of recent use of antibiotics and has only occasional boluses with carbohydrate  Also appears not to get enough improvement with correction factors  HIGHEST blood sugars are running in the late afternoon and late evening        Side effects  from medications have been: None  Compliance with the medical regimen: Fair  Glucose monitoring:  with freestyle meter on the Omnipod and libre  CGM use % of time  51  2-week average/SD  215, GV 41  Time in range     32   %  % Time Above 180  26  % Time above 250  37  % Time Below 70 5     PRE-MEAL Fasting Lunch Dinner Bedtime Overall  Glucose range:       Averages:  154  179  240  251    POST-MEAL PC Breakfast PC Lunch PC Dinner  Glucose range:     Averages:  179  268  251    Previous data:  CGM use % of time  64  Average and SD  172, GV 45  Time in range    53    %  % Time Above 180  22  % Time above 250  18  % Time Below target  7    PRE-MEAL Fasting Lunch Dinner Bedtime Overall  Glucose range:       Mean/median:  175  195  169  130  172   POST-MEAL PC Breakfast PC Lunch PC Dinner  Glucose range:     Mean/median:  213  134  152      Self-care: Meal times are:  Breakfast is at 9-10 am, Lunch-1 PM : Dinner: 8 pm   Typical meal intake: Breakfast is Usually toast and boiled eggs.  Usually getting cold cut sandwiches at lunch, grilled chicken and vegetables/potatoes at dinner.  Snacks are usually chips, crackers                Dietician visit, most recent: 05/02/16               Exercise:  Minimal walking, limited by pain  Weight history: His highest weight in the past has been 378    Wt Readings from Last 3 Encounters:  11/03/19 265 lb (120.2 kg)  10/30/19 265 lb 6.4 oz (120.4 kg)  07/28/19 273 lb 12.8 oz (124.2 kg)    Glycemic control:   Lab Results  Component Value Date   HGBA1C 11.7 (H) 10/29/2019   HGBA1C 10.7 (H) 06/05/2019   HGBA1C 8.8 (H) 11/04/2018   Lab Results  Component Value Date   MICROALBUR 128.3 (H) 06/05/2019   LDLCALC 78 07/31/2018   CREATININE 1.30 10/29/2019   Lab Results  Component Value Date   MICRALBCREAT 186.4 (H) 06/05/2019    Lab Results  Component Value Date   FRUCTOSAMINE 284 09/30/2018   FRUCTOSAMINE 565 (H)  05/10/2018   FRUCTOSAMINE 437 (H) 04/15/2018      Allergies as of 11/03/2019      Reactions   Vancomycin Other (See Comments)   AKI      Medication List       Accurate as of November 03, 2019 12:01 PM. If you have any questions, ask your nurse or doctor.        STOP taking these medications   NovoLOG 100 UNIT/ML injection Generic drug: insulin aspart Stopped by: [name], MD     TAKE these medications   amoxicillin-clavulanate 875-125 MG tablet Commonly known as: AUGMENTIN Take 1 tablet by mouth 2 (two) times daily.   benazepril 40 MG tablet Commonly known as: LOTENSIN Take 1 tablet (40 mg total) by mouth daily.   cloNIDine 0.1 mg/24hr patch Commonly known as: CATAPRES - Dosed in mg/24 hr APPLY 1 PATCH(0.1 MG) EXTERNALLY TO THE SKIN 1 TIME A WEEK   empagliflozin 25 MG Tabs tablet Commonly known as: Jardiance Take 25 mg by mouth daily.   Fifty50 Pen Needles 31G X 5 MM Misc Generic drug: Insulin Pen Needle Use to inject insulin once daily   furosemide 20 MG tablet Commonly known as: LASIX Take 1 tablet (20 mg total) by mouth daily.   glucose blood test strip Contour Next Test Strips  TEST 4 TIMES DAILY   glucose blood test strip Use Bayer Contour Next Test strips as instructed to check blood sugar 4 times daily.   HUMULIN R 500 UNIT/ML injection Generic drug: insulin regular human CONCENTRATED Use maximum of 0.6 mL/day in insulin pump Started by: [name], MD   labetalol 100 MG tablet Commonly known as: NORMODYNE Take 1 tablet (100 mg total) by mouth 2 (two) times daily. TAKE 1 TABLET BY MOUTH TWICE DAILY.   levofloxacin 500 MG tablet Commonly known as: LEVAQUIN Take 1 tablet (500 mg total) by mouth daily for 10 days.   Lyrica 150 MG capsule Generic drug: pregabalin Take 1 capsule (150 mg total) by mouth 3 (three) times daily.   methocarbamol 500 MG tablet Commonly known as: Robaxin Take 1 tablet (500 mg total) by mouth every 6 (six) hours  as needed for muscle spasms.   nortriptyline 10 MG  capsule Commonly known as: PAMELOR TAKE 4 CAPSULES BY MOUTH AT BEDTIME   OmniPod Dash 5 Pack Pods Misc Inject 1 each into the skin continuous. Apply 1 OmniPod insulin pump to body daily for insulin delivery.   pravastatin 40 MG tablet Commonly known as: PRAVACHOL Take 1 tablet (40 mg total) by mouth every evening.   traMADol 50 MG tablet Commonly known as: ULTRAM Take 2 tablets (100 mg total) by mouth every 6 (six) hours as needed for moderate pain.   Victoza 18 MG/3ML Sopn Generic drug: liraglutide Inject 0.2 mLs (1.2 mg total) into the skin daily. Inject once daily at the same time       Allergies:  Allergies  Allergen Reactions  . Vancomycin Other (See Comments)    AKI    Past Medical History:  Diagnosis Date  . Asthma   . Back pain    Bulging Disk  . Chronic kidney disease    sees Dr. Jamal Maes    . Diabetes mellitus    sees Dr. Dwyane Dee   . Diabetic foot ulcers ([place])    sees Dr. Wylene Simmer   . GERD (gastroesophageal reflux disease)   . Headache(784.0)   . Hyperlipidemia   . Hypertension    sees Dr. Percival Spanish   . Sleep apnea    CPAP    Past Surgical History:  Procedure Laterality Date  . AMPUTATION Left 07/26/2015   Procedure: AMPUTATION LEFT FIFTH RAY;  Surgeon: [name], MD;  Location: [facility];  Service: Orthopedics;  Laterality: Left;  . bilateral hip pins placed    . INCISION AND DRAINAGE PERIRECTAL ABSCESS Right 03/07/2016   Procedure: IRRIGATION AND DEBRIDEMENT PERIRECTAL ABSCESS;  Surgeon: Alphonsa Overall, MD;  Location: [facility];  Service: General;  Laterality: Right;  . INCISION AND DRAINAGE PERIRECTAL ABSCESS Right 03/09/2016   Procedure: EXAM UNDER ANESTHESIA, IRRIGATION AND DEBRIDEMENT PERIRECTAL ABSCESS;  Surgeon: Johnathan Hausen, MD;  Location: [facility];  Service: General;  Laterality: Right;  Open, betadine packed wound    Family History  Problem Relation Age of Onset  . Arthritis Other     . Diabetes Other   . Hypertension Other   . Hyperlipidemia Other   . Stroke Other   . Sudden death Other   . Diabetes Mother   . Diabetes Sister   . Diabetes Brother   . Heart attack Father        Died ate 65, couple of "heart attacks"     Social History:  reports that he quit smoking about 4 years ago. His smoking use included cigarettes. He has a 10.00 pack-year smoking history. He has never used smokeless tobacco. He reports that he does not drink alcohol and does not use drugs.   Review of Systems   Lipid history: LDL has been below 100, mild increase in triglycerides present Has been started on pravastatin by cardiologist    Lab Results  Component Value Date   CHOL 146 06/05/2019   HDL 28.10 (L) 06/05/2019   LDLCALC 78 07/31/2018   LDLDIRECT 85.0 06/05/2019   TRIG 226.0 (H) 06/05/2019   CHOLHDL 5 06/05/2019           Hypertension: On treatment for several years  Currently he is taking benazepril 40 mg and has gone back to the 0.1 mg clonidine patch as prescribed Also on labetalol 100 mg twice daily Blood pressure appears to be inconsistent, was better with cardiologist  Home BP 150/80   BP Readings from Last  3 Encounters:  11/03/19 (!) 152/90  10/30/19 (!) 142/78  07/28/19 (!) 160/70   Lab Results  Component Value Date   CREATININE 1.30 10/29/2019   CREATININE 1.09 06/05/2019   CREATININE 1.04 11/04/2018     Patient only on antibiotics from PCP for cellulitis of left foot associated with swelling     Neuropathy: He has sharp pains in his feet from neuropathy,  on Lyrica 150 mg  Followed by neurologist Also on nortriptyline at bedtime, now taking 40 mg Duloxetine may not have helped previously   He was recommended diabetic shoes by podiatrist   LABS:  Lab on 10/29/2019  Component Date Value Ref Range Status  . Sodium 10/29/2019 129* 135 - 145 mEq/L Final  . Potassium 10/29/2019 4.5  3.5 - 5.1 mEq/L Final  . Chloride 10/29/2019 97  96 -  112 mEq/L Final  . CO2 10/29/2019 23  19 - 32 mEq/L Final  . Glucose, Bld 10/29/2019 417* 70 - 99 mg/dL Final  . BUN 10/29/2019 16  6 - 23 mg/dL Final  . Creatinine, Ser 10/29/2019 1.30  0.40 - 1.50 mg/dL Final  . GFR 10/29/2019 73.15  >60.00 mL/min Final  . Calcium 10/29/2019 9.0  8.4 - 10.5 mg/dL Final  . Hgb A1c MFr Bld 10/29/2019 11.7* 4.6 - 6.5 % Final   Glycemic Control Guidelines for People with Diabetes:Non Diabetic:  <6%Goal of Therapy: <7%Additional Action Suggested:  >8%     Physical Examination:  BP (!) 152/90   Pulse 94   Ht 5\' 10"  (1.778 m)   Wt 265 lb (120.2 kg)   SpO2 99%   BMI 38.02 kg/m    ASSESSMENT:  Diabetes type 2, uncontrolled, insulin-dependent   See history of present illness for detailed discussion of current diabetes management, blood sugar patterns and problems identified  His A1c is 11.7  He is using the OmniPod insulin pump along with the freestyle libre  Also taking Victoza, Jardiance 25 mg  His blood sugars are significantly higher from not using his insulin pump consistently and not covering his blood sugar with insulin injections in between Discussed today the need for meals consistently his insulin pump since his basal insulin is not delivered once the pump goes off Also likely is not bolusing enough and covering all his high blood sugars Using freestyle libre only intermittently  HYPERTENSION: Blood pressure is again not well controlled with benazepril, clonidine patch and labetalol He thinks this is from stress  Microalbuminuria on ACE inhibitor  PLAN:     He was given detailed information on how to get an adhesive like skin tac to keep both his sensor and insulin pump on consistently  Current settings for now will be basal rate 2.2 midnight-7 AM and 6.5 along with carb ratio 1:20  Trial of U-500 insulin instead of NovoLog  Discussed in detail the nature of concentrated insulin, adjustment of insulin pump settings by a factor  of 5 for using the U-500 insulin, timing of boluses to be 30 minutes before eating and also slow onset of action of the insulin  He will reduce his basal rate overnight slightly and also after 9 PM with the new insulin, will give him about the same basal rate between 5 and 9 AM giving him the equivalent of 6 units/h with the U-100 insulin  Carbohydrate coverage empirically will be 1: 40 and sensitivity 1: 60  Need to check blood sugars consistently several times a day and cover all high readings with  boluses  Double labetalol until blood pressure is better controlled  We will need to follow-up renal function as it is borderline    Patient Instructions  Labetalol 200mg  twice daily  Use the following settings for the U-500 concentrated regular insulin, only fill up the pump with a minimum volume required  Basal rate 12 AM-5 AM = 0.40.  5 AM-9 PM = 1.3, 9 PM-12 AM = 0.6  Insulin to carb ratio 1:40 Sensitivity/ISF: 1: 60 Active insulin time 6 hours Same target range        [name] 11/03/2019, 12:01 PM   Note: This office note was prepared with Dragon voice recognition system technology. Any transcriptional errors that result from this process are unintentional.

## 2019-11-08 DIAGNOSIS — E11621 Type 2 diabetes mellitus with foot ulcer: Secondary | ICD-10-CM | POA: Diagnosis not present

## 2019-11-10 ENCOUNTER — Telehealth: Payer: Self-pay

## 2019-11-10 NOTE — Telephone Encounter (Signed)
Per medicare requirements, last office notes sent to [facility].

## 2019-11-19 ENCOUNTER — Inpatient Hospital Stay (HOSPITAL_COMMUNITY): Admission: RE | Admit: 2019-11-19 | Payer: Medicare Other | Source: Ambulatory Visit

## 2019-11-19 ENCOUNTER — Other Ambulatory Visit (HOSPITAL_COMMUNITY): Payer: Self-pay | Admitting: Orthopedic Surgery

## 2019-11-19 DIAGNOSIS — E11621 Type 2 diabetes mellitus with foot ulcer: Secondary | ICD-10-CM | POA: Diagnosis not present

## 2019-11-19 DIAGNOSIS — M86172 Other acute osteomyelitis, left ankle and foot: Secondary | ICD-10-CM | POA: Diagnosis not present

## 2019-11-19 HISTORY — PX: TOE AMPUTATION: SHX809

## 2019-11-19 NOTE — Progress Notes (Addendum)
Patient denies shortness of breath, fever, cough or chest pain.  PCP - [name], MD Cardiologist - Denies Endocrinology:  Dr. [name]  Chest x-ray - 04/03/18 EKG - 06/24/19 Stress Test - 03/16/17 ECHO - 04/08/18 Cardiac Cath - Denies  ICD Pacemaker/Loop - N/A  Sleep Study -  Yes CPAP - NO  Insulin pump - decrease basal rate 20% at MD before surgery  . Do not take oral diabetes medicines (pills) the morning of surgery.  . The day of surgery, do not take other diabetes injectables, including Byetta (exenatide), Bydureon (exenatide ER), Victoza (liraglutide), or Trulicity (dulaglutide).  . Check your blood sugar at least 4 times a day, starting 2 days before surgery, to make sure that the level is not too high or low. o Check your blood sugar the morning of your surgery when you wake up and every 2 hours until you get to the Short Stay unit. . If your blood sugar is less than 70 mg/dL, you will need to treat for low blood sugar: o Do not take insulin. o Treat a low blood sugar (less than 70 mg/dL) with  cup of clear juice (cranberry or apple), 4 glucose tablets, OR glucose gel. o Recheck blood sugar in 15 minutes after treatment (to make sure it is greater than 70 mg/dL). If your blood sugar is not greater than 70 mg/dL on recheck, call [phone] for further instructions.   Blood Thinner Instructions:  Follow your surgeon's instructions on when to stop prior to surgery.  Aspirin Instructions: Follow your surgeon's instructions on when to stop aspirin prior to surgery,  If no instructions were given by your surgeon then you will need to call the office for those instructions.  ERAS: 0930  Anesthesia review: Yes, a1c 11.7, insulin pump  STOP now taking any Aspirin (unless otherwise instructed by your surgeon), Aleve, Naproxen, Ibuprofen, Motrin, Advil, Goody's, BC's, all herbal medications, fish oil, and all vitamins.   Coronavirus Screening Do you have any of the following  symptoms:  Cough yes/no: No Fever (>100.21F)  yes/no: No Runny nose yes/no: No Sore throat yes/no: No Difficulty breathing/shortness of breath  yes/no: No  Have you traveled in the last 14 days and where? yes/no: No  Patient verbalized understanding of instructions that were given to them at the PAT appointment. Patient was also instructed that they will need to review over the PAT instructions again at home before surgery.

## 2019-11-19 NOTE — Anesthesia Preprocedure Evaluation (Addendum)
Anesthesia Evaluation  Patient identified by MRN, date of birth, ID band Patient awake    Reviewed: Allergy & Precautions, NPO status , Patient's Chart, lab work & pertinent test results  Airway Mallampati: II  TM Distance: >3 FB     Dental  (+) Poor Dentition, Missing, Chipped, Dental Advisory Given, Teeth Intact,    Pulmonary asthma , sleep apnea , former smoker,    breath sounds clear to auscultation       Cardiovascular hypertension,  Rhythm:Regular Rate:Normal     Neuro/Psych    GI/Hepatic GERD  ,  Endo/Other  diabetes, Poorly Controlled, Type 2, Insulin Dependent  Renal/GU Renal disease     Musculoskeletal   Abdominal (+) + obese,   Peds  Hematology   Anesthesia Other Findings   Reproductive/Obstetrics                           Anesthesia Physical Anesthesia Plan  ASA: III  Anesthesia Plan: General   Post-op Pain Management:    Induction: Intravenous  PONV Risk Score and Plan:   Airway Management Planned: LMA  Additional Equipment:   Intra-op Plan:   Post-operative Plan:   Informed Consent: I have reviewed the patients History and Physical, chart, labs and discussed the procedure including the risks, benefits and alternatives for the proposed anesthesia with the patient or authorized representative who has indicated his/her understanding and acceptance.     Dental advisory given  Plan Discussed with:   Anesthesia Plan Comments: (PAT note by Karoline Caldwell, PA-C: Follows with cardiology for history of hypertension, dyslipidemia, OSA, palpitations, chest pain.  He had a negative treadmill stress test in 2019.  He had an echo in 2020 showing normal LV function, moderate LVH, mild diastolic dysfunction, mild TR, mild for hypertension.  Last seen by Dr. Percival Spanish 06/24/2019.  No changes to management at that time.  Advised follow-up in 1 year.  Uncontrolled IDDM 2 followed by  endocrinology.  Last A1c 11.7 on 10/29/2019.  He is on an Omni pod insulin pump with reportedly poor compliance.  Last seen by Dr. Dwyane Dee 11/03/2019 and his NovoLog was changed to U 500 concentrated regular insulin for use in OmniPod.  Will need DOS labs end eval.   EKG 06/24/19: NSR. Rightward axis.  CHEST - 2 VIEW 04/03/18:  COMPARISON:  02/07/2017  FINDINGS: The heart size and mediastinal contours are within normal limits. Both lungs are clear. The visualized skeletal structures are unremarkable.  IMPRESSION: No active cardiopulmonary disease.  TTE 04/08/18: 1. The left ventricle has normal systolic function with an ejection  fraction of 60-65%. The cavity size was normal. There is moderately  increased left ventricular wall thickness. Left ventricular diastolic  Doppler parameters are consistent with impaired  relaxation.  2. The right ventricle has normal systolic function. The cavity was  normal. There is no increase in right ventricular wall thickness.  3. The mitral valve is normal in structure.  4. The tricuspid valve is normal in structure.  5. The aortic valve is tricuspid.  6. The pulmonic valve was normal in structure.  7. The aortic root is normal in size and structure.  8. Normal LV function; moderate LVH; mild diastolic dysfunction; mild TR  with mild pulmonary hypertension.   Exercise tolerance test 03/16/17: Blood pressure demonstrated a normal response to exercise. There was no ST segment deviation noted during stress. Clinically and electrically negative for ischemia. )  Anesthesia Quick Evaluation  

## 2019-11-19 NOTE — Progress Notes (Signed)
Anesthesia Chart Review: Same day workup  Follows with cardiology for history of hypertension, dyslipidemia, OSA, palpitations, chest pain.  He had a negative treadmill stress test in 2019.  He had an echo in 2020 showing normal LV function, moderate LVH, mild diastolic dysfunction, mild TR, mild for hypertension.  Last seen by Dr. Percival Spanish 06/24/2019.  No changes to management at that time.  Advised follow-up in 1 year.  Uncontrolled IDDM 2 followed by endocrinology.  Last A1c 11.7 on 10/29/2019.  He is on an Omni pod insulin pump with reportedly poor compliance.  Last seen by Dr. Dwyane Dee 11/03/2019 and his NovoLog was changed to U 500 concentrated regular insulin for use in OmniPod.  Will need DOS labs end eval.   EKG 06/24/19: NSR. Rightward axis.  CHEST - 2 VIEW 04/03/18:  COMPARISON:  02/07/2017  FINDINGS: The heart size and mediastinal contours are within normal limits. Both lungs are clear. The visualized skeletal structures are unremarkable.  IMPRESSION: No active cardiopulmonary disease.  TTE 04/08/18: 1. The left ventricle has normal systolic function with an ejection  fraction of 60-65%. The cavity size was normal. There is moderately  increased left ventricular wall thickness. Left ventricular diastolic  Doppler parameters are consistent with impaired  relaxation.  2. The right ventricle has normal systolic function. The cavity was  normal. There is no increase in right ventricular wall thickness.  3. The mitral valve is normal in structure.  4. The tricuspid valve is normal in structure.  5. The aortic valve is tricuspid.  6. The pulmonic valve was normal in structure.  7. The aortic root is normal in size and structure.  8. Normal LV function; moderate LVH; mild diastolic dysfunction; mild TR  with mild pulmonary hypertension.   Exercise tolerance test 03/16/17:  Blood pressure demonstrated a normal response to exercise.  There was no ST segment deviation  noted during stress.  Clinically and electrically negative for ischemia.    Wynonia Musty [facility] Short Stay Center/Anesthesiology Phone [phone] 11/19/2019 3:58 PM

## 2019-11-20 ENCOUNTER — Ambulatory Visit (HOSPITAL_COMMUNITY): Payer: Medicare Other | Admitting: Physician Assistant

## 2019-11-20 ENCOUNTER — Other Ambulatory Visit: Payer: Self-pay

## 2019-11-20 ENCOUNTER — Encounter (HOSPITAL_COMMUNITY)
Admission: RE | Disposition: A | Discharge status: Home / Self Care | Payer: Self-pay | Source: Home / Self Care | Attending: Orthopedic Surgery

## 2019-11-20 ENCOUNTER — Inpatient Hospital Stay (HOSPITAL_COMMUNITY)
Admission: RE | Admit: 2019-11-20 | Discharge: 2019-11-24 | DRG: 617 | Disposition: A | Discharge status: Home / Self Care | Payer: Medicare Other | Attending: Orthopedic Surgery | Admitting: Orthopedic Surgery

## 2019-11-20 ENCOUNTER — Encounter (HOSPITAL_COMMUNITY): Payer: Self-pay | Admitting: Orthopedic Surgery

## 2019-11-20 DIAGNOSIS — J45909 Unspecified asthma, uncomplicated: Secondary | ICD-10-CM | POA: Diagnosis present

## 2019-11-20 DIAGNOSIS — L97529 Non-pressure chronic ulcer of other part of left foot with unspecified severity: Secondary | ICD-10-CM | POA: Diagnosis present

## 2019-11-20 DIAGNOSIS — Z6838 Body mass index (BMI) 38.0-38.9, adult: Secondary | ICD-10-CM

## 2019-11-20 DIAGNOSIS — G4733 Obstructive sleep apnea (adult) (pediatric): Secondary | ICD-10-CM | POA: Diagnosis present

## 2019-11-20 DIAGNOSIS — Z6833 Body mass index (BMI) 33.0-33.9, adult: Secondary | ICD-10-CM

## 2019-11-20 DIAGNOSIS — Z87891 Personal history of nicotine dependence: Secondary | ICD-10-CM | POA: Diagnosis not present

## 2019-11-20 DIAGNOSIS — Z9641 Presence of insulin pump (external) (internal): Secondary | ICD-10-CM | POA: Diagnosis present

## 2019-11-20 DIAGNOSIS — M6702 Short Achilles tendon (acquired), left ankle: Secondary | ICD-10-CM | POA: Diagnosis not present

## 2019-11-20 DIAGNOSIS — E669 Obesity, unspecified: Secondary | ICD-10-CM | POA: Diagnosis not present

## 2019-11-20 DIAGNOSIS — I129 Hypertensive chronic kidney disease with stage 1 through stage 4 chronic kidney disease, or unspecified chronic kidney disease: Secondary | ICD-10-CM | POA: Diagnosis present

## 2019-11-20 DIAGNOSIS — K219 Gastro-esophageal reflux disease without esophagitis: Secondary | ICD-10-CM | POA: Diagnosis present

## 2019-11-20 DIAGNOSIS — Z794 Long term (current) use of insulin: Secondary | ICD-10-CM | POA: Diagnosis not present

## 2019-11-20 DIAGNOSIS — E1169 Type 2 diabetes mellitus with other specified complication: Secondary | ICD-10-CM | POA: Diagnosis not present

## 2019-11-20 DIAGNOSIS — E119 Type 2 diabetes mellitus without complications: Secondary | ICD-10-CM | POA: Diagnosis not present

## 2019-11-20 DIAGNOSIS — Z20822 Contact with and (suspected) exposure to covid-19: Secondary | ICD-10-CM | POA: Diagnosis present

## 2019-11-20 DIAGNOSIS — M869 Osteomyelitis, unspecified: Secondary | ICD-10-CM | POA: Diagnosis present

## 2019-11-20 DIAGNOSIS — E1165 Type 2 diabetes mellitus with hyperglycemia: Secondary | ICD-10-CM | POA: Diagnosis present

## 2019-11-20 DIAGNOSIS — Z23 Encounter for immunization: Secondary | ICD-10-CM | POA: Diagnosis not present

## 2019-11-20 DIAGNOSIS — E785 Hyperlipidemia, unspecified: Secondary | ICD-10-CM | POA: Diagnosis present

## 2019-11-20 DIAGNOSIS — M86172 Other acute osteomyelitis, left ankle and foot: Secondary | ICD-10-CM | POA: Diagnosis not present

## 2019-11-20 DIAGNOSIS — E1142 Type 2 diabetes mellitus with diabetic polyneuropathy: Secondary | ICD-10-CM | POA: Diagnosis present

## 2019-11-20 DIAGNOSIS — E11621 Type 2 diabetes mellitus with foot ulcer: Secondary | ICD-10-CM | POA: Diagnosis not present

## 2019-11-20 DIAGNOSIS — N1831 Chronic kidney disease, stage 3a: Secondary | ICD-10-CM | POA: Diagnosis present

## 2019-11-20 DIAGNOSIS — Z79899 Other long term (current) drug therapy: Secondary | ICD-10-CM

## 2019-11-20 DIAGNOSIS — E1122 Type 2 diabetes mellitus with diabetic chronic kidney disease: Secondary | ICD-10-CM | POA: Diagnosis present

## 2019-11-20 DIAGNOSIS — H409 Unspecified glaucoma: Secondary | ICD-10-CM | POA: Diagnosis present

## 2019-11-20 DIAGNOSIS — E1140 Type 2 diabetes mellitus with diabetic neuropathy, unspecified: Secondary | ICD-10-CM | POA: Diagnosis not present

## 2019-11-20 DIAGNOSIS — L02612 Cutaneous abscess of left foot: Secondary | ICD-10-CM | POA: Diagnosis not present

## 2019-11-20 DIAGNOSIS — I10 Essential (primary) hypertension: Secondary | ICD-10-CM | POA: Diagnosis not present

## 2019-11-20 HISTORY — PX: TRANSMETATARSAL AMPUTATION: SHX6197

## 2019-11-20 LAB — CBC
HCT: 42 % (ref 39.0–52.0)
Hemoglobin: 13.8 g/dL (ref 13.0–17.0)
MCH: 29.7 pg (ref 26.0–34.0)
MCHC: 32.9 g/dL (ref 30.0–36.0)
MCV: 90.5 fL (ref 80.0–100.0)
Platelets: 454 10*3/uL — ABNORMAL HIGH (ref 150–400)
RBC: 4.64 MIL/uL (ref 4.22–5.81)
RDW: 11.9 % (ref 11.5–15.5)
WBC: 9.1 10*3/uL (ref 4.0–10.5)
nRBC: 0 % (ref 0.0–0.2)

## 2019-11-20 LAB — GLUCOSE, CAPILLARY
Glucose-Capillary: 161 mg/dL — ABNORMAL HIGH (ref 70–99)
Glucose-Capillary: 131 mg/dL — ABNORMAL HIGH (ref 70–99)
Glucose-Capillary: 162 mg/dL — ABNORMAL HIGH (ref 70–99)
Glucose-Capillary: 153 mg/dL — ABNORMAL HIGH (ref 70–99)
Glucose-Capillary: 172 mg/dL — ABNORMAL HIGH (ref 70–99)

## 2019-11-20 LAB — BASIC METABOLIC PANEL
Anion gap: 8 (ref 5–15)
BUN: 13 mg/dL (ref 6–20)
CO2: 27 mmol/L (ref 22–32)
Calcium: 9 mg/dL (ref 8.9–10.3)
Chloride: 101 mmol/L (ref 98–111)
Creatinine, Ser: 1.01 mg/dL (ref 0.61–1.24)
GFR calc non Af Amer: >60 mL/min (ref >60)
Glucose, Bld: 148 mg/dL — ABNORMAL HIGH (ref 70–99)
Potassium: 4.3 mmol/L (ref 3.5–5.1)
Sodium: 136 mmol/L (ref 135–145)

## 2019-11-20 LAB — SEDIMENTATION RATE: Sed Rate: 45 mm/hr — ABNORMAL HIGH (ref 0–16)

## 2019-11-20 LAB — C-REACTIVE PROTEIN: CRP: 1.5 mg/dL — ABNORMAL HIGH (ref <1.0)

## 2019-11-20 LAB — RESPIRATORY PANEL BY RT PCR (FLU A&B, COVID)
Influenza A by PCR: NEGATIVE
Influenza B by PCR: NEGATIVE
SARS Coronavirus 2 by RT PCR: NEGATIVE

## 2019-11-20 SURGERY — AMPUTATION, FOOT, TRANSMETATARSAL
Anesthesia: General | Site: Foot | Laterality: Left

## 2019-11-20 MED ORDER — ORAL CARE MOUTH RINSE: 15.0000 mL | Freq: Once | OROMUCOSAL | Status: AC

## 2019-11-20 MED ORDER — MIDAZOLAM HCL 2 MG/2ML IJ SOLN
INTRAMUSCULAR | Status: AC
  Filled 2019-11-20: qty 2

## 2019-11-20 MED ORDER — LABETALOL HCL 5 MG/ML IV SOLN
5.0000 mg | Freq: Once | INTRAVENOUS | Status: AC
  Administered 2019-11-20: 5 mg via INTRAVENOUS

## 2019-11-20 MED ORDER — PREGABALIN 75 MG PO CAPS
150.0000 mg | ORAL_CAPSULE | Freq: Three times a day (TID) | ORAL | Status: DC
  Administered 2019-11-20 – 2019-11-24 (×12): 150 mg via ORAL
  Filled 2019-11-20 (×12): qty 2

## 2019-11-20 MED ORDER — PHENYLEPHRINE 40 MCG/ML (10ML) SYRINGE FOR IV PUSH (FOR BLOOD PRESSURE SUPPORT)
PREFILLED_SYRINGE | INTRAVENOUS | Status: DC | PRN
  Administered 2019-11-20: 40 ug via INTRAVENOUS
  Administered 2019-11-20 (×3): 80 ug via INTRAVENOUS
  Administered 2019-11-20: 120 ug via INTRAVENOUS

## 2019-11-20 MED ORDER — FENTANYL CITRATE (PF) 100 MCG/2ML IJ SOLN
INTRAMUSCULAR | Status: AC
  Filled 2019-11-20: qty 2

## 2019-11-20 MED ORDER — INSULIN ASPART 100 UNIT/ML ~~LOC~~ SOLN
0.0000 [IU] | Freq: Three times a day (TID) | SUBCUTANEOUS | Status: DC
  Administered 2019-11-20: 3 [IU] via SUBCUTANEOUS

## 2019-11-20 MED ORDER — INSULIN ASPART 100 UNIT/ML ~~LOC~~ SOLN
SUBCUTANEOUS | Status: AC
  Filled 2019-11-20: qty 1

## 2019-11-20 MED ORDER — MORPHINE SULFATE (PF) 2 MG/ML IV SOLN
0.5000 mg | INTRAVENOUS | Status: DC | PRN
  Administered 2019-11-20 – 2019-11-23 (×4): 1 mg via INTRAVENOUS
  Filled 2019-11-20 (×4): qty 1

## 2019-11-20 MED ORDER — PHENYLEPHRINE 40 MCG/ML (10ML) SYRINGE FOR IV PUSH (FOR BLOOD PRESSURE SUPPORT)
PREFILLED_SYRINGE | INTRAVENOUS | Status: AC
  Filled 2019-11-20: qty 20

## 2019-11-20 MED ORDER — LABETALOL HCL 100 MG PO TABS
100.0000 mg | ORAL_TABLET | Freq: Once | ORAL | Status: AC
  Administered 2019-11-20: 100 mg via ORAL
  Filled 2019-11-20: qty 1

## 2019-11-20 MED ORDER — ENOXAPARIN SODIUM 40 MG/0.4ML ~~LOC~~ SOLN
40.0000 mg | SUBCUTANEOUS | Status: DC
  Administered 2019-11-21 – 2019-11-24 (×4): 40 mg via SUBCUTANEOUS
  Filled 2019-11-20 (×4): qty 0.4

## 2019-11-20 MED ORDER — METOCLOPRAMIDE HCL 5 MG/ML IJ SOLN: 5.0000 mg | Freq: Three times a day (TID) | INTRAMUSCULAR | Status: DC | PRN

## 2019-11-20 MED ORDER — FENTANYL CITRATE (PF) 100 MCG/2ML IJ SOLN
25.0000 ug | INTRAMUSCULAR | Status: DC | PRN
  Administered 2019-11-20 (×4): 25 ug via INTRAVENOUS

## 2019-11-20 MED ORDER — LIDOCAINE 2% (20 MG/ML) 5 ML SYRINGE
INTRAMUSCULAR | Status: DC | PRN
  Administered 2019-11-20: 40 mg via INTRAVENOUS

## 2019-11-20 MED ORDER — CHLORHEXIDINE GLUCONATE 0.12 % MT SOLN
OROMUCOSAL | Status: AC
  Administered 2019-11-20: 15 mL via OROMUCOSAL
  Filled 2019-11-20: qty 15

## 2019-11-20 MED ORDER — FENTANYL CITRATE (PF) 250 MCG/5ML IJ SOLN
INTRAMUSCULAR | Status: AC
  Filled 2019-11-20: qty 5

## 2019-11-20 MED ORDER — LIDOCAINE 2% (20 MG/ML) 5 ML SYRINGE
INTRAMUSCULAR | Status: AC
  Filled 2019-11-20: qty 5

## 2019-11-20 MED ORDER — LIRAGLUTIDE 18 MG/3ML ~~LOC~~ SOPN: 1.2000 mg | PEN_INJECTOR | Freq: Every day | SUBCUTANEOUS | Status: DC

## 2019-11-20 MED ORDER — FENTANYL CITRATE (PF) 250 MCG/5ML IJ SOLN
INTRAMUSCULAR | Status: DC | PRN
Start: 2019-11-20 — End: 2019-11-20
  Administered 2019-11-20: 100 ug via INTRAVENOUS

## 2019-11-20 MED ORDER — PHENYLEPHRINE HCL-NACL 10-0.9 MG/250ML-% IV SOLN
INTRAVENOUS | Status: DC | PRN
  Administered 2019-11-20: 40 ug/min via INTRAVENOUS

## 2019-11-20 MED ORDER — HYDROCODONE-ACETAMINOPHEN 7.5-325 MG PO TABS
1.0000 | ORAL_TABLET | ORAL | Status: DC | PRN
  Administered 2019-11-20 – 2019-11-24 (×10): 2 via ORAL
  Filled 2019-11-20 (×10): qty 2

## 2019-11-20 MED ORDER — ONDANSETRON HCL 4 MG/2ML IJ SOLN: 4.0000 mg | Freq: Once | INTRAMUSCULAR | Status: DC | PRN

## 2019-11-20 MED ORDER — TOBRAMYCIN SULFATE 1.2 G IJ SOLR
INTRAMUSCULAR | Status: DC | PRN
  Administered 2019-11-20: 1.2 g via TOPICAL

## 2019-11-20 MED ORDER — INSULIN ASPART 100 UNIT/ML ~~LOC~~ SOLN: 4.0000 [IU] | Freq: Three times a day (TID) | SUBCUTANEOUS | Status: DC

## 2019-11-20 MED ORDER — DEXTROSE 5 % IV SOLN
3.0000 g | INTRAVENOUS | Status: AC
  Administered 2019-11-20: 3 g via INTRAVENOUS
  Filled 2019-11-20: qty 3

## 2019-11-20 MED ORDER — OXYCODONE HCL 5 MG PO TABS: 5.0000 mg | ORAL_TABLET | Freq: Once | ORAL | Status: DC | PRN

## 2019-11-20 MED ORDER — OXYCODONE HCL 5 MG/5ML PO SOLN: 5.0000 mg | Freq: Once | ORAL | Status: DC | PRN

## 2019-11-20 MED ORDER — DOCUSATE SODIUM 100 MG PO CAPS
100.0000 mg | ORAL_CAPSULE | Freq: Two times a day (BID) | ORAL | Status: DC
  Administered 2019-11-20 – 2019-11-24 (×7): 100 mg via ORAL
  Filled 2019-11-20 (×7): qty 1

## 2019-11-20 MED ORDER — SODIUM CHLORIDE 0.9 % IV SOLN: INTRAVENOUS | Status: DC

## 2019-11-20 MED ORDER — PROPOFOL 10 MG/ML IV BOLUS
INTRAVENOUS | Status: DC | PRN
  Administered 2019-11-20: 180 mg via INTRAVENOUS

## 2019-11-20 MED ORDER — CLONIDINE HCL 0.1 MG/24HR TD PTWK
0.1000 mg | MEDICATED_PATCH | TRANSDERMAL | Status: DC
  Administered 2019-11-20: 0.1 mg via TRANSDERMAL
  Filled 2019-11-20: qty 1

## 2019-11-20 MED ORDER — HYDROCODONE-ACETAMINOPHEN 5-325 MG PO TABS
1.0000 | ORAL_TABLET | ORAL | Status: DC | PRN
  Administered 2019-11-20 – 2019-11-22 (×3): 2 via ORAL
  Filled 2019-11-20 (×2): qty 2

## 2019-11-20 MED ORDER — LACTATED RINGERS IV SOLN: INTRAVENOUS | Status: DC

## 2019-11-20 MED ORDER — INSULIN PUMP
Freq: Three times a day (TID) | SUBCUTANEOUS | Status: DC
  Administered 2019-11-20: 5.15 via SUBCUTANEOUS
  Administered 2019-11-21: 6.6 via SUBCUTANEOUS
  Administered 2019-11-21: 0.9 via SUBCUTANEOUS
  Administered 2019-11-22: 5.45 via SUBCUTANEOUS
  Administered 2019-11-22: 0.45 via SUBCUTANEOUS
  Administered 2019-11-22: 5.3 via SUBCUTANEOUS
  Administered 2019-11-22: 0.85 via SUBCUTANEOUS
  Administered 2019-11-23: 5.35 via SUBCUTANEOUS
  Administered 2019-11-23 (×2): 2.9 via SUBCUTANEOUS
  Administered 2019-11-23: 2 via SUBCUTANEOUS
  Administered 2019-11-24: 3.9 via SUBCUTANEOUS
  Filled 2019-11-20: qty 1

## 2019-11-20 MED ORDER — MIDAZOLAM HCL 5 MG/5ML IJ SOLN
INTRAMUSCULAR | Status: DC | PRN
  Administered 2019-11-20: 2 mg via INTRAVENOUS

## 2019-11-20 MED ORDER — HYDROCODONE-ACETAMINOPHEN 5-325 MG PO TABS
ORAL_TABLET | ORAL | Status: AC
Start: 2019-11-20 — End: 2019-11-21
  Filled 2019-11-20: qty 2

## 2019-11-20 MED ORDER — LATANOPROST 0.005 % OP SOLN
1.0000 [drp] | Freq: Every day | OPHTHALMIC | Status: DC
  Administered 2019-11-20 – 2019-11-23 (×4): 1 [drp] via OPHTHALMIC
  Filled 2019-11-20: qty 2.5

## 2019-11-20 MED ORDER — ONDANSETRON HCL 4 MG/2ML IJ SOLN
INTRAMUSCULAR | Status: AC
  Filled 2019-11-20: qty 2

## 2019-11-20 MED ORDER — SODIUM CHLORIDE 0.9 % IV SOLN
2.0000 g | Freq: Three times a day (TID) | INTRAVENOUS | Status: DC
  Administered 2019-11-20 – 2019-11-21 (×3): 2 g via INTRAVENOUS
  Filled 2019-11-20 (×3): qty 2

## 2019-11-20 MED ORDER — MAGNESIUM CITRATE PO SOLN: 1.0000 | Freq: Once | ORAL | Status: DC | PRN

## 2019-11-20 MED ORDER — LABETALOL HCL 100 MG PO TABS
100.0000 mg | ORAL_TABLET | Freq: Two times a day (BID) | ORAL | Status: DC
  Administered 2019-11-20 – 2019-11-24 (×8): 100 mg via ORAL
  Filled 2019-11-20 (×10): qty 1

## 2019-11-20 MED ORDER — PROPOFOL 10 MG/ML IV BOLUS
INTRAVENOUS | Status: AC
  Filled 2019-11-20: qty 20

## 2019-11-20 MED ORDER — EMPAGLIFLOZIN 25 MG PO TABS
25.0000 mg | ORAL_TABLET | Freq: Every day | ORAL | Status: DC
  Filled 2019-11-20: qty 1

## 2019-11-20 MED ORDER — ONDANSETRON HCL 4 MG PO TABS: 4.0000 mg | ORAL_TABLET | Freq: Four times a day (QID) | ORAL | Status: DC | PRN

## 2019-11-20 MED ORDER — INSULIN ASPART 100 UNIT/ML ~~LOC~~ SOLN: 0.0000 [IU] | Freq: Every day | SUBCUTANEOUS | Status: DC

## 2019-11-20 MED ORDER — CHLORHEXIDINE GLUCONATE 0.12 % MT SOLN: 15.0000 mL | Freq: Once | OROMUCOSAL | Status: AC

## 2019-11-20 MED ORDER — ONDANSETRON HCL 4 MG/2ML IJ SOLN
INTRAMUSCULAR | Status: DC | PRN
  Administered 2019-11-20: 4 mg via INTRAVENOUS

## 2019-11-20 MED ORDER — ACETAMINOPHEN 325 MG PO TABS: 325.0000 mg | ORAL_TABLET | Freq: Four times a day (QID) | ORAL | Status: DC | PRN

## 2019-11-20 MED ORDER — POLYETHYLENE GLYCOL 3350 17 G PO PACK: 17.0000 g | PACK | Freq: Every day | ORAL | Status: DC | PRN

## 2019-11-20 MED ORDER — BISACODYL 10 MG RE SUPP: 10.0000 mg | Freq: Every day | RECTAL | Status: DC | PRN

## 2019-11-20 MED ORDER — PRAVASTATIN SODIUM 40 MG PO TABS: 40.0000 mg | ORAL_TABLET | Freq: Every evening | ORAL | Status: DC

## 2019-11-20 MED ORDER — TOBRAMYCIN SULFATE 1.2 G IJ SOLR
INTRAMUSCULAR | Status: AC
  Filled 2019-11-20: qty 1.2

## 2019-11-20 MED ORDER — CLONIDINE HCL 0.1 MG/24HR TD PTWK
0.1000 mg | MEDICATED_PATCH | TRANSDERMAL | Status: DC
  Filled 2019-11-20: qty 1

## 2019-11-20 MED ORDER — 0.9 % SODIUM CHLORIDE (POUR BTL) OPTIME
TOPICAL | Status: DC | PRN
  Administered 2019-11-20: 1000 mL

## 2019-11-20 MED ORDER — BUPIVACAINE-EPINEPHRINE 0.5% -1:200000 IJ SOLN
INTRAMUSCULAR | Status: AC
  Filled 2019-11-20: qty 1

## 2019-11-20 MED ORDER — METOCLOPRAMIDE HCL 5 MG PO TABS: 5.0000 mg | ORAL_TABLET | Freq: Three times a day (TID) | ORAL | Status: DC | PRN

## 2019-11-20 MED ORDER — ONDANSETRON HCL 4 MG/2ML IJ SOLN: 4.0000 mg | Freq: Four times a day (QID) | INTRAMUSCULAR | Status: DC | PRN

## 2019-11-20 MED ORDER — LABETALOL HCL 5 MG/ML IV SOLN
INTRAVENOUS | Status: AC
  Filled 2019-11-20: qty 4

## 2019-11-20 MED ORDER — CEFAZOLIN SODIUM-DEXTROSE 2-4 GM/100ML-% IV SOLN
INTRAVENOUS | Status: AC
  Filled 2019-11-20: qty 100

## 2019-11-20 MED ORDER — INSULIN ASPART 100 UNIT/ML ~~LOC~~ SOLN: 0.0000 [IU] | Freq: Three times a day (TID) | SUBCUTANEOUS | Status: DC

## 2019-11-20 MED ORDER — INSULIN ASPART 100 UNIT/ML ~~LOC~~ SOLN
5.0000 [IU] | Freq: Once | SUBCUTANEOUS | Status: AC
  Administered 2019-11-20: 5 [IU] via SUBCUTANEOUS

## 2019-11-20 MED ORDER — SENNA 8.6 MG PO TABS
1.0000 | ORAL_TABLET | Freq: Two times a day (BID) | ORAL | Status: DC
  Administered 2019-11-20 – 2019-11-24 (×7): 8.6 mg via ORAL
  Filled 2019-11-20 (×7): qty 1

## 2019-11-20 MED ORDER — ATORVASTATIN CALCIUM 40 MG PO TABS
40.0000 mg | ORAL_TABLET | Freq: Every day | ORAL | Status: DC
  Administered 2019-11-20 – 2019-11-24 (×5): 40 mg via ORAL
  Filled 2019-11-20 (×5): qty 1

## 2019-11-20 MED ORDER — BENAZEPRIL HCL 20 MG PO TABS
40.0000 mg | ORAL_TABLET | Freq: Every day | ORAL | Status: DC
  Administered 2019-11-20 – 2019-11-24 (×5): 40 mg via ORAL
  Filled 2019-11-20: qty 2
  Filled 2019-11-20 (×2): qty 1
  Filled 2019-11-20 (×3): qty 2

## 2019-11-20 MED ORDER — NORTRIPTYLINE HCL 10 MG PO CAPS
40.0000 mg | ORAL_CAPSULE | Freq: Every day | ORAL | Status: DC
  Administered 2019-11-20 – 2019-11-23 (×4): 40 mg via ORAL
  Filled 2019-11-20 (×5): qty 4

## 2019-11-20 SURGICAL SUPPLY — 73 items
APL PRP STRL LF DISP 70% ISPRP (MISCELLANEOUS) ×1
BANDAGE ESMARK 6X9 LF (GAUZE/BANDAGES/DRESSINGS) IMPLANT
BLADE 10 SAFETY STRL DISP (BLADE) ×2 IMPLANT
BLADE SAW RECIP 87.9 MT (BLADE) ×2 IMPLANT
BNDG CMPR 9X4 STRL LF SNTH (GAUZE/BANDAGES/DRESSINGS) ×1
BNDG CMPR 9X6 STRL LF SNTH (GAUZE/BANDAGES/DRESSINGS) ×2
BNDG COHESIVE 4X5 TAN STRL (GAUZE/BANDAGES/DRESSINGS) ×2 IMPLANT
BNDG COHESIVE 6X5 TAN STRL LF (GAUZE/BANDAGES/DRESSINGS) ×2 IMPLANT
BNDG ELASTIC 4X5.8 VLCR STR LF (GAUZE/BANDAGES/DRESSINGS) ×2 IMPLANT
BNDG ELASTIC 6X5.8 VLCR STR LF (GAUZE/BANDAGES/DRESSINGS) ×4 IMPLANT
BNDG ESMARK 4X9 LF (GAUZE/BANDAGES/DRESSINGS) ×3 IMPLANT
BNDG ESMARK 6X9 LF (GAUZE/BANDAGES/DRESSINGS) ×6
CANISTER SUCT 3000ML PPV (MISCELLANEOUS) ×3 IMPLANT
CHLORAPREP W/TINT 26 (MISCELLANEOUS) ×3 IMPLANT
COVER SURGICAL LIGHT HANDLE (MISCELLANEOUS) ×2 IMPLANT
COVER WAND RF STERILE (DRAPES) ×3 IMPLANT
CUFF TOURN SGL QUICK 34 (TOURNIQUET CUFF) ×3
CUFF TOURN SGL QUICK 42 (TOURNIQUET CUFF) IMPLANT
CUFF TRNQT CYL 34X4.125X (TOURNIQUET CUFF) IMPLANT
DRAPE U-SHAPE 47X51 STRL (DRAPES) ×6 IMPLANT
DRSG ADAPTIC 3X8 NADH LF (GAUZE/BANDAGES/DRESSINGS) ×3 IMPLANT
DRSG MEPITEL 4X7.2 (GAUZE/BANDAGES/DRESSINGS) ×3 IMPLANT
DRSG PAD ABDOMINAL 8X10 ST (GAUZE/BANDAGES/DRESSINGS) ×6 IMPLANT
ELECT CAUTERY BLADE 6.4 (BLADE) ×2 IMPLANT
ELECT REM PT RETURN 9FT ADLT (ELECTROSURGICAL) ×3
ELECTRODE REM PT RTRN 9FT ADLT (ELECTROSURGICAL) ×1 IMPLANT
GAUZE SPONGE 4X4 12PLY STRL (GAUZE/BANDAGES/DRESSINGS) ×3 IMPLANT
GLOVE BIO SURGEON STRL SZ7 (GLOVE) ×2 IMPLANT
GLOVE BIO SURGEON STRL SZ8 (GLOVE) ×8 IMPLANT
GLOVE BIOGEL PI IND STRL 6.5 (GLOVE) IMPLANT
GLOVE BIOGEL PI IND STRL 7.0 (GLOVE) IMPLANT
GLOVE BIOGEL PI IND STRL 7.5 (GLOVE) IMPLANT
GLOVE BIOGEL PI IND STRL 8 (GLOVE) ×2 IMPLANT
GLOVE BIOGEL PI INDICATOR 6.5 (GLOVE) ×4
GLOVE BIOGEL PI INDICATOR 7.0 (GLOVE) ×2
GLOVE BIOGEL PI INDICATOR 7.5 (GLOVE) ×2
GLOVE BIOGEL PI INDICATOR 8 (GLOVE) ×4
GLOVE ECLIPSE 8.0 STRL XLNG CF (GLOVE) ×6 IMPLANT
GOWN STRL REUS W/ TWL LRG LVL3 (GOWN DISPOSABLE) ×1 IMPLANT
GOWN STRL REUS W/ TWL XL LVL3 (GOWN DISPOSABLE) ×2 IMPLANT
GOWN STRL REUS W/TWL LRG LVL3 (GOWN DISPOSABLE) ×3
GOWN STRL REUS W/TWL XL LVL3 (GOWN DISPOSABLE) ×6
KIT BASIN OR (CUSTOM PROCEDURE TRAY) ×3 IMPLANT
KIT TURNOVER KIT B (KITS) ×3 IMPLANT
NDL HYPO 25GX1X1/2 BEV (NEEDLE) IMPLANT
NEEDLE HYPO 25GX1X1/2 BEV (NEEDLE) IMPLANT
NS IRRIG 1000ML POUR BTL (IV SOLUTION) ×3 IMPLANT
PACK ORTHO EXTREMITY (CUSTOM PROCEDURE TRAY) ×3 IMPLANT
PAD ABD 8X10 STRL (GAUZE/BANDAGES/DRESSINGS) ×4 IMPLANT
PAD ARMBOARD 7.5X6 YLW CONV (MISCELLANEOUS) ×6 IMPLANT
PAD CAST 4YDX4 CTTN HI CHSV (CAST SUPPLIES) ×1 IMPLANT
PADDING CAST ABS 6INX4YD NS (CAST SUPPLIES) ×2
PADDING CAST ABS COTTON 6X4 NS (CAST SUPPLIES) ×1 IMPLANT
PADDING CAST COTTON 4X4 STRL (CAST SUPPLIES) ×3
PADDING CAST COTTON 6X4 STRL (CAST SUPPLIES) ×3 IMPLANT
SPLINT PLASTER CAST XFAST 5X30 (CAST SUPPLIES) IMPLANT
SPLINT PLASTER XFAST SET 5X30 (CAST SUPPLIES) ×2
SPONGE LAP 18X18 RF (DISPOSABLE) ×3 IMPLANT
STOCKINETTE IMPERVIOUS LG (DRAPES) IMPLANT
SUCTION FRAZIER HANDLE 10FR (MISCELLANEOUS) ×3
SUCTION TUBE FRAZIER 10FR DISP (MISCELLANEOUS) ×1 IMPLANT
SUT ETHILON 2 0 FS 18 (SUTURE) ×8 IMPLANT
SUT ETHILON 2 0 PSLX (SUTURE) ×6 IMPLANT
SUT MNCRL AB 3-0 PS2 18 (SUTURE) ×3 IMPLANT
SUT PDS AB 1 CT  36 (SUTURE) ×3
SUT PDS AB 1 CT 36 (SUTURE) ×1 IMPLANT
SYR BULB IRRIG 60ML STRL (SYRINGE) ×2 IMPLANT
SYR CONTROL 10ML LL (SYRINGE) IMPLANT
TOWEL GREEN STERILE (TOWEL DISPOSABLE) ×3 IMPLANT
TOWEL GREEN STERILE FF (TOWEL DISPOSABLE) ×3 IMPLANT
TUBE CONNECTING 12'X1/4 (SUCTIONS) ×1
TUBE CONNECTING 12X1/4 (SUCTIONS) ×2 IMPLANT
UNDERPAD 30X36 HEAVY ABSORB (UNDERPADS AND DIAPERS) ×3 IMPLANT

## 2019-11-20 NOTE — Anesthesia Postprocedure Evaluation (Signed)
Anesthesia Post Note  Patient: Danny Fuller  Procedure(s) Performed: TRANSMETATARSAL AMPUTATION LEFT FOOT (Left Foot)     Patient location during evaluation: PACU Anesthesia Type: General Level of consciousness: awake and alert Pain management: pain level controlled Vital Signs Assessment: post-procedure vital signs reviewed and stable Respiratory status: spontaneous breathing, nonlabored ventilation, respiratory function stable and patient connected to nasal cannula oxygen Cardiovascular status: blood pressure returned to baseline and stable Postop Assessment: no apparent nausea or vomiting Anesthetic complications: no   No complications documented.  Last Vitals:  Vitals:   11/20/19 1445 11/20/19 1500  BP: (!) 158/92 (!) 163/92  Pulse: 85 87  Resp: 13 12  Temp:    SpO2: 99% 97%    Last Pain:  Vitals:   11/20/19 1445  PainSc: 9                  Selisa Tensley COKER

## 2019-11-20 NOTE — Anesthesia Procedure Notes (Signed)
Procedure Name: LMA Insertion Date/Time: 11/20/2019 1:12 PM Performed by: Colin Benton, CRNA Pre-anesthesia Checklist: Patient identified, Emergency Drugs available, Suction available and Patient being monitored Patient Re-evaluated:Patient Re-evaluated prior to induction Oxygen Delivery Method: Circle System Utilized Preoxygenation: Pre-oxygenation with 100% oxygen Induction Type: IV induction Ventilation: Mask ventilation without difficulty LMA: LMA inserted LMA Size: 4.0 Number of attempts: 1 Airway Equipment and Method: Bite block Placement Confirmation: positive ETCO2 Tube secured with: Tape Dental Injury: Teeth and Oropharynx as per pre-operative assessment

## 2019-11-20 NOTE — Transfer of Care (Signed)
Immediate Anesthesia Transfer of Care Note  Patient: Danny Fuller  Procedure(s) Performed: TRANSMETATARSAL AMPUTATION LEFT FOOT (Left Foot)  Patient Location: PACU  Anesthesia Type:General  Level of Consciousness: drowsy and patient cooperative  Airway & Oxygen Therapy: Patient Spontanous Breathing and Patient connected to face mask oxygen  Post-op Assessment: Report given to RN, Post -op Vital signs reviewed and stable and Patient moving all extremities X 4  Post vital signs: Reviewed and stable  Last Vitals:  Vitals Value Taken Time  BP 150/93 11/20/19 1431  Temp    Pulse 88 11/20/19 1432  Resp 18 11/20/19 1432  SpO2 100 % 11/20/19 1432  Vitals shown include unvalidated device data.  Last Pain:  Vitals:   11/20/19 1138  PainSc: 0-No pain      Patients Stated Pain Goal: 5 (38/88/28 0034)  Complications: No complications documented.

## 2019-11-20 NOTE — Progress Notes (Signed)
Pharmacy Antibiotic Note  Danny Fuller is a 44 y.o. male admitted on 11/20/2019 with L foot DFI, with MRI showing osteomyelitis; pt is S/P transmetatarsal amputation of L foot this afternoon.  Pharmacy has been consulted for cefepime dosing for osteomyelitis.  WBC 9.1, afebrile, Scr 1.01, CrCl 124 ml/min  Plan: Cefepime 2 gm IV Q 8 hrs Monitor WBC, temp, clinical progress, renal function, cultures  Height: 5\' 10"  (177.8 cm) Weight: 120.2 kg (265 lb) IBW/kg (Calculated) : 73  Temp (24hrs), Avg:98 F (36.7 C), Min:97.7 F (36.5 C), Max:98.3 F (36.8 C)  Recent Labs  Lab 11/20/19 1233  WBC 9.1  CREATININE 1.01    Estimated Creatinine Clearance: 123.8 mL/min (by C-G formula based on SCr of 1.01 mg/dL).    Allergies  Allergen Reactions  . Augmentin [Amoxicillin-Pot Clavulanate] Itching and Other (See Comments)    Dizziness  . Vancomycin Other (See Comments)    AKI    Antimicrobials this admission: 10/7 cefazolin X 1 pre op 10/7 cefepime >>  Microbiology results: 10/7 COVID, flu A, flu B: all negative 10/7 surgical cx from area of abscess: pending  Thank you for allowing pharmacy to be a part of this patient's care.  Gillermina Hu, PharmD, BCPS, [facility] Clinical Pharmacist 11/20/2019 3:34 PM

## 2019-11-20 NOTE — Consult Note (Addendum)
Medical Consultation   Danny Fuller  ZTI:[id number]  DOB: [birth year]  DOA: 11/20/2019  PCP: [name], MD   Outpatient Specialists: Dr. Percival Spanish, cardiology   Requesting physician: Dr. Doran Durand  Reason for consultation: Uncontrolled diabetes, hypertension and other comorbidities    History of Present Illness: Danny Fuller is an 44 y.o. 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  He underwent left transmetatarsal amputation by Dr. Doran Durand, and we were consulted for help managing his uncontrolled diabetes, hypertension and other comorbidities.  Patient noted callus on his left foot about 2 months ago.  He says he saw a foot doctor who did not feel there was an infection.  Eventually, it started draining and he was a started on antibiotic.  The drainage did not improve, and PCP prescribed him "stronger antibiotic" but the wound didn't heal.  Eventually, he was sent to orthopedic surgery and had an MRI that showed osteomyelitis of the right fourth MT, pyarthrosis at the fourth MTPJ, abscess and phlegmon dorsal to the bone.  I have no access to his MRI image or report.  Patient was seen in PACU after transmetatarsal amputation.  He has no significant complaints other than surgical pain that he rates 8-9/10, and mild right shoulder pain which is chronic.  He denies fever, chills, chest pain, dyspnea, GI or UTI symptoms.  He reports good compliance with his diabetic medication and blood pressure medication.  His A1c was 11.7 on 10/29/2019.  He is also on Jardiance and Victoza.  He has remote diagnosis of OSA but never used CPAP.  He now refuses to wear CPAP.  Patient denies smoking cigarettes, drinking alcohol or recreational drug use.  He wished to be full code.  On  admission, BP 184/99>> 151/98.  BMP and CBC without significant finding.  COVID-19 and influenza PCR negative.  CBG elevated to 150s.   Surgical deep tissue sent for culture. Patient was started on cefepime postop.  ROS All review of system negative except for pertinent positives and negatives as history of present illness above.  Past Medical History: Past Medical History:  Diagnosis Date  . Asthma   . Back pain    Bulging Disk  . Chronic kidney disease    sees Dr. Jamal Maes    . Diabetes mellitus    sees Dr. Dwyane Dee   . Diabetic foot ulcers ([place])    sees Dr. Wylene Simmer   . GERD (gastroesophageal reflux disease)   . Headache(784.0)   . Hyperlipidemia   . Hypertension    sees Dr. Percival Spanish   . Sleep apnea    CPAP    Past Surgical History: Past Surgical History:  Procedure Laterality Date  . AMPUTATION Left 07/26/2015   Procedure: AMPUTATION LEFT FIFTH RAY;  Surgeon: [name], MD;  Location: [place];  Service: Orthopedics;  Laterality: Left;  . bilateral hip pins placed    . INCISION AND DRAINAGE PERIRECTAL ABSCESS Right 03/07/2016   Procedure: IRRIGATION AND DEBRIDEMENT PERIRECTAL ABSCESS;  Surgeon: Alphonsa Overall, MD;  Location: [facility];  Service: General;  Laterality: Right;  . INCISION AND DRAINAGE PERIRECTAL ABSCESS Right 03/09/2016   Procedure: EXAM UNDER ANESTHESIA, IRRIGATION AND DEBRIDEMENT PERIRECTAL ABSCESS;  Surgeon: Johnathan Hausen, MD;  Location: [facility];  Service: General;  Laterality: Right;  Open, betadine packed wound     Allergies:   Allergies  Allergen Reactions  . Augmentin [Amoxicillin-Pot Clavulanate] Itching and Other (See Comments)    Dizziness  . Vancomycin Other (See Comments)    AKI     Social History:  reports that he quit smoking about 4 years ago. His smoking use included cigarettes. He has a 10.00 pack-year smoking history. He has never used smokeless tobacco. He reports that he does not drink alcohol and does not use drugs.   Family  History: Family History  Problem Relation Age of Onset  . Arthritis Other   . Diabetes Other   . Hypertension Other   . Hyperlipidemia Other   . Stroke Other   . Sudden death Other   . Diabetes Mother   . Diabetes Sister   . Diabetes Brother   . Heart attack Father        Died ate 72, couple of "heart attacks"     Physical Exam: Vitals:   11/20/19 1052 11/20/19 1430 11/20/19 1445 11/20/19 1500  BP: (!) 184/99 (!) 150/93 (!) 158/92 (!) 163/92  Pulse: 98 85 85 87  Resp: 20 15 13 12   Temp: 98.3 F (36.8 C) 97.7 F (36.5 C)    SpO2: 100% 100% 99% 97%  Weight: 120.2 kg     Height: 5\' 10"  (1.778 m)       Constitutional - resting comfortably, no acute distress Eyes - vision grossly intact. Sclera anicteric. PERRL.  Nose - no gross deformity or drainage Mouth - no oral lesions noted Throat - no swelling or erythema Endo - no obvious thyromegaly CV -. (+)S1S2, no murmurs; no JVD or peripheral edema Resp - No increased work of breathing, good aeration bilaterally, no wheeze or crackles GI - (+)BS, soft, non-tender, non-distended MSK - normal range of motion. S/p left foot transmetatarsal amputation. Skin - no obvious rashes or lesions.  Ace wrap over LLE. Neuro - alert, aware, oriented to person/place/time. No gross deficit Psych - calm, normal mood and affect    Data reviewed:  I have personally reviewed following labs and imaging studies Labs:  CBC: Recent Labs  Lab 11/20/19 1233  WBC 9.1  HGB 13.8  HCT 42.0  MCV 90.5  PLT 454*    Basic Metabolic Panel: Recent Labs  Lab 11/20/19 1233  NA 136  K 4.3  CL 101  CO2 27  GLUCOSE 148*  BUN 13  CREATININE 1.01  CALCIUM 9.0   GFR Estimated Creatinine Clearance: 123.8 mL/min (by C-G formula based on SCr of 1.01 mg/dL). Liver Function Tests: No results for input(s): AST, ALT, ALKPHOS, BILITOT, PROT, ALBUMIN in the last 168 hours. No results for input(s): LIPASE, AMYLASE in the last 168 hours. No results for  input(s): AMMONIA in the last 168 hours. Coagulation profile No results for input(s): INR, PROTIME in the last 168 hours.  Cardiac Enzymes: No results for input(s): CKTOTAL, CKMB, CKMBINDEX, TROPONINI in the last 168 hours. BNP: Invalid input(s): POCBNP CBG: Recent Labs  Lab 11/20/19 1053 11/20/19 1256 11/20/19 1430  GLUCAP 161* 131* 162*   D-Dimer No results for input(s): DDIMER in the last 72 hours. Hgb A1c No results for input(s): HGBA1C in the last 72 hours. Lipid Profile No results for input(s): CHOL, HDL, LDLCALC, TRIG, CHOLHDL, LDLDIRECT in the last 72 hours. Thyroid function studies No results for input(s): TSH, T4TOTAL, T3FREE, THYROIDAB in the last 72 hours.  Invalid input(s): FREET3 Anemia work up No results for input(s): VITAMINB12, FOLATE, FERRITIN, TIBC, IRON, RETICCTPCT in the last 72 hours. Urinalysis    Component Value Date/Time   COLORURINE YELLOW 04/03/2018 2004   APPEARANCEUR CLEAR 04/03/2018 2004   LABSPEC 1.015 04/03/2018 2004   PHURINE 5.0 04/03/2018 2004   GLUCOSEU >=500 (A) 04/03/2018 2004   GLUCOSEU NEGATIVE 03/23/2016 1502   HGBUR NEGATIVE 04/03/2018 2004   HGBUR negative 04/17/2008 0929   BILIRUBINUR NEGATIVE 04/03/2018 2004   BILIRUBINUR n 04/23/2013 [place] 04/03/2018 2004   PROTEINUR NEGATIVE 04/03/2018 2004   UROBILINOGEN 0.2 03/23/2016 1502   NITRITE NEGATIVE 04/03/2018 2004   LEUKOCYTESUR NEGATIVE 04/03/2018 2004     Microbiology Recent Results (from the past 240 hour(s))  Respiratory Panel by RT PCR (Flu A&B, Covid) - Nasopharyngeal Swab     Status: None   Collection Time: 11/20/19 10:51 AM   Specimen: Nasopharyngeal Swab  Result Value Ref Range Status   SARS Coronavirus 2 by RT PCR NEGATIVE NEGATIVE Final    Comment: (NOTE) SARS-CoV-2 target nucleic acids are NOT DETECTED.  The SARS-CoV-2 RNA is generally detectable in upper respiratoy specimens during the acute phase of infection. The  lowest concentration of SARS-CoV-2 viral copies this assay can detect is 131 copies/mL. A negative result does not preclude SARS-Cov-2 infection and should not be used as the sole basis for treatment or other patient management decisions. A negative result may occur with  improper specimen collection/handling, submission of specimen other than nasopharyngeal swab, presence of viral mutation(s) within the areas targeted by this assay, and inadequate number of viral copies (<131 copies/mL). A negative result must be combined with clinical observations, patient history, and epidemiological information. The expected result is Negative.  Fact Sheet for Patients:  PinkCheek.be  Fact Sheet for Healthcare Providers:  GravelBags.it  This test is no t yet approved or cleared by the [place] FDA and  has been authorized for detection and/or diagnosis of SARS-CoV-2 by FDA under an Emergency Use Authorization (EUA). This EUA will remain  in effect (meaning this test can be used) for the duration of the COVID-19 declaration under Section 564(b)(1) of the Act, 21 U.S.C. section 360bbb-3(b)(1), unless the authorization is terminated or revoked sooner.     Influenza A by PCR NEGATIVE NEGATIVE Final   Influenza B by PCR NEGATIVE NEGATIVE Final    Comment: (NOTE) The Xpert Xpress SARS-CoV-2/FLU/RSV assay is intended as an aid in  the diagnosis of influenza from Nasopharyngeal swab specimens and  should not be used as a sole basis for treatment. Nasal washings and  aspirates are unacceptable for Xpert Xpress SARS-CoV-2/FLU/RSV  testing.  Fact Sheet for Patients: PinkCheek.be  Fact Sheet for Healthcare Providers: GravelBags.it  This test is not yet approved or cleared by the [place] FDA and  has been authorized for detection and/or diagnosis of SARS-CoV-2 by  FDA under  an Emergency Use Authorization (EUA). This EUA will remain  in effect (meaning this test can be used) for the duration of the  Covid-19 declaration under Section 564(b)(1) of the Act, 21  U.S.C. section 360bbb-3(b)(1), unless the authorization is  terminated or revoked. Performed at [facility], [place] [address], [place], [place] [address]      Inpatient Medications:   Scheduled Meds: . fentaNYL      . insulin aspart       Continuous Infusions: . sodium chloride    . ceFAZolin    .  lactated ringers 10 mL/hr at 11/20/19 1301     Radiological Exams on Admission: No results found.  Impression/Recommendations Left foot osteomyelitis/diabetic ulcer/abscess-failed outpatient treatment with oral antibiotic twice -S/p left foot transmetatarsal amputation by Dr. Doran Durand -Infectious disease consulted by primary.  Currently on IV cefepime. -Follow surgical tissue culture -NWBAT on LLE per surgery -Pain control and VTE prophylaxis per surgery  Uncontrolled IDDM-2 with hyperglycemia/neuropathy/diabetic foot ulcer: A1c 11.7% on 10/29/2019. On insulin pump at home Recent Labs  Lab 11/20/19 1053 11/20/19 1256 11/20/19 1430 11/20/19 1717  GLUCAP 161* 131* 162* 153*  -Recheck hemoglobin A1c -Continue SSI-moderate.  Add nightly coverage and meal coverage. Switch to insulin pump when pump and supplies here. Wife to bring.  -Continue statin-needs high intensity statin  Uncontrolled hypertension: BP improved.  On clonidine, benazepril and Lasix at home.  Seems he is followed by cardiology. -Continue home medications-clonidine patch, benazepril and labetalol  OSA not on CPAP -Refused CPAP.  Hyperlipidemia -Check lipid panel in the morning -Needs high intensity statin.  Morbid obesity: BMI of 38.02 with diabetes. -Already on Victoza -Encourage lifestyle change to lose weight.    Thank you for this consultation.  Our [facility] hospitalist team will follow the patient with  you.   Time Spent: 50 minutes with more than 50% spent in reviewing records, counseling patient/family and coordinating care.  Mercy Riding M.D. [facility] 11/20/2019, 3:17 PM   Addendum in red

## 2019-11-20 NOTE — Progress Notes (Signed)
Inpatient Diabetes Program Recommendations  AACE/ADA: New Consensus Statement on Inpatient Glycemic Control (2015)  Target Ranges:  Prepandial:   less than 140 mg/dL      Peak postprandial:   less than 180 mg/dL (1-2 hours)      Critically ill patients:  140 - 180 mg/dL   Lab Results  Component Value Date   GLUCAP 153 (H) 11/20/2019   HGBA1C 11.7 (H) 10/29/2019    Review of Glycemic Control  Diabetes history: DM2 Outpatient Diabetes medications: OmniPod with Novolog, Jardiance 25 mg QAM, Victoza 1.2 mg QD. To start U-500 after finishing Novolog. Current orders for Inpatient glycemic control: Novolog 0-15 units tidwc and hs + 4 units tidwc, Jardiance 25 mg QAM  Stopped insulin pump at 0500. Cartridge was empty and pt did not want to change it out. Wife to bring supplies.  Inpatient Diabetes Program Recommendations:     D/C Novolog orders when OmniPod is started.  INSULIN regimen [id number] pump since 01/05/17  BASAL rate midnight = 2.6, 6:30 AM = 5.5  Total 113.15 units   Carbohydrate ratio 1: 25 Correction factor I: 30 Targets 120-140 Active insulin 4 hours  Non-insulin hypoglycemic drugs the patient is taking are: Jardiance 25 mg daily, Victoza 1.2 mg daily  His A1c is higher than usual at 11.7%.  Spoke with RN who states he just received pt from PACU. Pt states his wife will bring his pump tonight. Sent secure text to Hospitalist regarding the need for Insulin Pump Order Set. If wife does not bring pump, will need at least 1/2 of basal insulin (Lantus 56 units QHS)  Follow closely.  Thank you. Lorenda Peck, RD, LDN, CDE Inpatient Diabetes Coordinator [phone]

## 2019-11-20 NOTE — Progress Notes (Signed)
Talked to hospitalist w/ regards to insulin pump; wife will bring supplies;he said we can start it once supplies are in. Reported to nightshift RN about the situation.

## 2019-11-20 NOTE — H&P (Signed)
Danny Fuller is an 44 y.o. male.   Chief Complaint:  Left foot ulcer HPI: 44 y/o male with diabetic ulcer at the lateral forefoot that has no healed despite local wound care for many months.  MRI shows osteomyelitis of the 4th MT and pyarthrosis at the 4th MTPJ.  There is also abscess and phlegmon dorsal to the bone.  He presents now for 4th ray v. transmet amputation.  Past Medical History:  Diagnosis Date  . Asthma   . Back pain    Bulging Disk  . Chronic kidney disease    sees Dr. Jamal Maes    . Diabetes mellitus    sees Dr. Dwyane Dee   . Diabetic foot ulcers ([place])    sees Dr. Wylene Simmer   . GERD (gastroesophageal reflux disease)   . Headache(784.0)   . Hyperlipidemia   . Hypertension    sees Dr. Percival Spanish   . Sleep apnea    CPAP    Past Surgical History:  Procedure Laterality Date  . AMPUTATION Left 07/26/2015   Procedure: AMPUTATION LEFT FIFTH RAY;  Surgeon: [name], MD;  Location: [facility];  Service: Orthopedics;  Laterality: Left;  . bilateral hip pins placed    . INCISION AND DRAINAGE PERIRECTAL ABSCESS Right 03/07/2016   Procedure: IRRIGATION AND DEBRIDEMENT PERIRECTAL ABSCESS;  Surgeon: Alphonsa Overall, MD;  Location: [facility];  Service: General;  Laterality: Right;  . INCISION AND DRAINAGE PERIRECTAL ABSCESS Right 03/09/2016   Procedure: EXAM UNDER ANESTHESIA, IRRIGATION AND DEBRIDEMENT PERIRECTAL ABSCESS;  Surgeon: Johnathan Hausen, MD;  Location: [facility];  Service: General;  Laterality: Right;  Open, betadine packed wound    Family History  Problem Relation Age of Onset  . Arthritis Other   . Diabetes Other   . Hypertension Other   . Hyperlipidemia Other   . Stroke Other   . Sudden death Other   . Diabetes Mother   . Diabetes Sister   . Diabetes Brother   . Heart attack Father        Died ate 76, couple of "heart attacks"    Social History:  reports that he quit smoking about 4 years ago. His smoking use included cigarettes. He has a 10.00 pack-year smoking  history. He has never used smokeless tobacco. He reports that he does not drink alcohol and does not use drugs.  Allergies:  Allergies  Allergen Reactions  . Augmentin [Amoxicillin-Pot Clavulanate] Itching and Other (See Comments)    Dizziness  . Vancomycin Other (See Comments)    AKI    Medications Prior to Admission  Medication Sig Dispense Refill  . benazepril (LOTENSIN) 40 MG tablet Take 1 tablet (40 mg total) by mouth daily. 90 tablet 3  . cloNIDine (CATAPRES - DOSED IN MG/24 HR) 0.1 mg/24hr patch APPLY 1 PATCH(0.1 MG) EXTERNALLY TO THE SKIN 1 TIME A WEEK (Patient taking differently: Place 0.1 mg onto the skin once a week. Sundays) 4 patch 3  . empagliflozin (JARDIANCE) 25 MG TABS tablet Take 1 tablet (25 mg total) by mouth daily. 90 tablet 3  . furosemide (LASIX) 20 MG tablet Take 1 tablet (20 mg total) by mouth daily. (Patient taking differently: Take 20 mg by mouth as needed for fluid (swelling). ) 90 tablet 3  . glucose blood test strip Use Bayer Contour Next Test strips as instructed to check blood sugar 4 times daily. 150 each 3  . glucose blood test strip 1 each by Other route 4 (four) times daily.     Marland Kitchen  insulin aspart (NOVOLOG) 100 UNIT/ML injection Inject into the skin See admin instructions. Per insulin pump    . Insulin Disposable Pump (OMNIPOD DASH 5 PACK PODS) MISC Inject 1 each into the skin continuous. Apply 1 OmniPod insulin pump to body daily for insulin delivery. 30 each 12  . Insulin Pen Needle (FIFTY50 PEN NEEDLES) 31G X 5 MM MISC 1 each by Other route daily.     Marland Kitchen labetalol (NORMODYNE) 100 MG tablet Take 1 tablet (100 mg total) by mouth 2 (two) times daily. TAKE 1 TABLET BY MOUTH TWICE DAILY. (Patient taking differently: Take 100 mg by mouth 2 (two) times daily. ) 180 tablet 3  . LYRICA 150 MG capsule Take 1 capsule (150 mg total) by mouth 3 (three) times daily. 90 capsule 5  . methocarbamol (ROBAXIN) 500 MG tablet Take 1 tablet (500 mg total) by mouth every 6 (six)  hours as needed for muscle spasms. 60 tablet 5  . nortriptyline (PAMELOR) 10 MG capsule TAKE 4 CAPSULES BY MOUTH AT BEDTIME (Patient taking differently: Take 40 mg by mouth at bedtime. ) 360 capsule 3  . pravastatin (PRAVACHOL) 40 MG tablet Take 1 tablet (40 mg total) by mouth every evening. (Patient taking differently: Take 40 mg by mouth daily. ) 90 tablet 3  . insulin regular human CONCENTRATED (HUMULIN R) 500 UNIT/ML injection Use maximum of 0.6 mL/day in insulin pump (Patient not taking: Reported on 11/20/2019) 20 mL 1  . latanoprost (XALATAN) 0.005 % ophthalmic solution Place 1 drop into both eyes daily.    . traMADol (ULTRAM) 50 MG tablet Take 2 tablets (100 mg total) by mouth every 6 (six) hours as needed for moderate pain. (Patient not taking: Reported on 11/20/2019) 120 tablet 5  . VICTOZA 18 MG/3ML SOPN Inject 1.2 mg into the skin daily. Inject once daily at the same time (Patient not taking: Reported on 11/20/2019) 9 mL 0    Results for orders placed or performed during the hospital encounter of 11/20/19 (from the past 48 hour(s))  Respiratory Panel by RT PCR (Flu A&B, Covid) - Nasopharyngeal Swab     Status: None   Collection Time: 11/20/19 10:51 AM   Specimen: Nasopharyngeal Swab  Result Value Ref Range   SARS Coronavirus 2 by RT PCR NEGATIVE NEGATIVE    Comment: (NOTE) SARS-CoV-2 target nucleic acids are NOT DETECTED.  The SARS-CoV-2 RNA is generally detectable in upper respiratoy specimens during the acute phase of infection. The lowest concentration of SARS-CoV-2 viral copies this assay can detect is 131 copies/mL. A negative result does not preclude SARS-Cov-2 infection and should not be used as the sole basis for treatment or other patient management decisions. A negative result may occur with  improper specimen collection/handling, submission of specimen other than nasopharyngeal swab, presence of viral mutation(s) within the areas targeted by this assay, and inadequate  number of viral copies (<131 copies/mL). A negative result must be combined with clinical observations, patient history, and epidemiological information. The expected result is Negative.  Fact Sheet for Patients:  PinkCheek.be  Fact Sheet for Healthcare Providers:  GravelBags.it  This test is no t yet approved or cleared by the [place] FDA and  has been authorized for detection and/or diagnosis of SARS-CoV-2 by FDA under an Emergency Use Authorization (EUA). This EUA will remain  in effect (meaning this test can be used) for the duration of the COVID-19 declaration under Section 564(b)(1) of the Act, 21 U.S.C. section 360bbb-3(b)(1), unless the authorization is terminated  or revoked sooner.     Influenza A by PCR NEGATIVE NEGATIVE   Influenza B by PCR NEGATIVE NEGATIVE    Comment: (NOTE) The Xpert Xpress SARS-CoV-2/FLU/RSV assay is intended as an aid in  the diagnosis of influenza from Nasopharyngeal swab specimens and  should not be used as a sole basis for treatment. Nasal washings and  aspirates are unacceptable for Xpert Xpress SARS-CoV-2/FLU/RSV  testing.  Fact Sheet for Patients: PinkCheek.be  Fact Sheet for Healthcare Providers: GravelBags.it  This test is not yet approved or cleared by the [place] FDA and  has been authorized for detection and/or diagnosis of SARS-CoV-2 by  FDA under an Emergency Use Authorization (EUA). This EUA will remain  in effect (meaning this test can be used) for the duration of the  Covid-19 declaration under Section 564(b)(1) of the Act, 21  U.S.C. section 360bbb-3(b)(1), unless the authorization is  terminated or revoked. Performed at [facility], [place] [address] [place], [place] [address]   Glucose, capillary     Status: Abnormal   Collection Time: 11/27/19 10:53 AM  Result Value Ref Range    Glucose-Capillary 161 (H) 70 - 99 mg/dL    Comment: Glucose reference range applies only to samples taken after fasting for at least 8 hours.   No results found.  Review of Systems  No recent f/c/n/v/wt loss.  Purulent drainage from the left forefoot over the last week.  Blood pressure (!) 184/99, pulse 98, temperature 98.3 F (36.8 C), resp. rate 20, height 5\' 10"  (1.778 m), weight 120.2 kg, SpO2 100 %. Physical Exam  wn wd male in  [place].  A and O x 4.  Normal mood and affect.  EOMI.  resp unlabored. L foot with previous 5th ray amputation.  Ulcer laterally draining pus.  Swelling localized to the dorsal foot.  Diminished sens to LT at the forefoot dorsally an dplantarly.   Assessment/Plan L foot osteomyelitis and nonhealing diabetic ulcer - to the OR for 4th ray v. transmet amputation.  Hold abx for cultures.  The risks and benefits of the alternative treatment options have been discussed in detail.  The patient wishes to proceed with surgery and specifically understands risks of bleeding, infection, nerve damage, blood clots, need for additional surgery, amputation and death.   Wylene Simmer, MD 11/27/19, 12:43 PM

## 2019-11-20 NOTE — Progress Notes (Signed)
Patient educated on use of insulin pump and log sheet to keep track of blood glucose levels .Patient verbalized understanding. Will continue to monitor.

## 2019-11-20 NOTE — Progress Notes (Signed)
Pt. Reported he stopped the insulin pump this am at 0500. Stated the cartridge was empty and he didn't want to change it out. Dr. Linna Caprice notified. Pt. Will have wife bring supplies.

## 2019-11-20 NOTE — Op Note (Signed)
11/20/2019  2:37 PM  PATIENT:  Danny Fuller  44 y.o. male  PRE-OPERATIVE DIAGNOSIS:  Left foot diabetic ulcer, abscess and osteomyelitis  POST-OPERATIVE DIAGNOSIS:  same  Procedure(s): 1.  Left percutaneous achilles tendon lengthening   2.  Left foot transmetatarsal amputation  SURGEON:  Wylene Simmer, MD  ASSISTANT: none  ANESTHESIA:   General  EBL:  minimal   TOURNIQUET:   Total Tourniquet Time Documented: Thigh (Left) - 47 minutes Total: Thigh (Left) - 47 minutes  COMPLICATIONS:  None apparent  DISPOSITION:  Extubated, awake and stable to recovery.  INDICATION FOR PROCEDURE: Mr. Hagood is a 44 year old male with a past medical history significant for diabetes complicated by peripheral neuropathy.  He has a history of previous fifth ray amputation.  He has had a chronic lateral forefoot ulcer beneath the fourth metatarsal head.  MRI reveals osteomyelitis involving the entire fourth metatarsal and a large abscess and phlegmon over the dorsal lateral forefoot.  He has failed nonoperative treatment including offloading, antibiotics and local wound care.  He presents now for left transmetatarsal amputation since a fourth ray amputation in light of the dorsal abscess and soft tissue involvement will not leave a residual plantigrade foot.  The patient understands the risks and benefits of the alternative treatment options and elect surgical treatment.  The risks and benefits of the alternative treatment options have been discussed in detail.  The patient wishes to proceed with surgery and specifically understands risks of bleeding, infection, nerve damage, blood clots, need for additional surgery, amputation and death.  PROCEDURE IN DETAIL:  After pre operative consent was obtained, and the correct operative site was identified, the patient was brought to the operating room and placed supine on the OR table.  Anesthesia was administered.  Pre-operative antibiotics were held.  A surgical  timeout was taken.  The left lower extremity was prepped and draped in standard sterile fashion with a tourniquet around the thigh.  The extremity was elevated and the tourniquet was inflated to 250 mmHg.  An incision was marked on the skin at the base of the medial 3 toes.  It was marked proximally to excise necrotic skin dorsal laterally.  Plantarly it was marked to excise the previous ulcer.  The incision was made carried down to bone.  Subperiosteal elevation was then performed along the medial 3 rays.  The reciprocating saw was used to cut through 3 metatarsals beveling the cuts plantarward.  The fourth metatarsal was then disarticulated proximally and the forefoot was passed off the field.  A deep specimen was obtained from the area of the abscess and sent to microbiology for aerobic and anaerobic culture.  Perioperative antibiotics were then administered.  The dorsal and plantar flaps were contoured appropriately for tension-free closure.  A percutaneous triple hemisection Achilles tendon lengthening was then performed allowing dorsiflexion of approximately 30 degrees with the knee extended.  The wound was then irrigated copiously.  Neurovascular bundles were cauterized.  Approximately 0.75 g of tobramycin powder was placed in the wound.  Skin incision was closed with simple and horizontal mattress sutures of 2-0 nylon.  Sterile dressings were applied followed by a well-padded short leg splint.  The tourniquet was released after application of the dressings.  The patient was awakened from anesthesia and transported to the recovery room in stable condition.   FOLLOW UP PLAN: Nonweightbearing on the left lower extremity.  IV antibiotics pending wound culture.  Hospitalist consultation and infectious disease consultation will be ordered.  Patient  may start DVT prophylaxis on postop day 1.

## 2019-11-21 ENCOUNTER — Encounter (HOSPITAL_COMMUNITY): Payer: Self-pay | Admitting: Orthopedic Surgery

## 2019-11-21 DIAGNOSIS — E1142 Type 2 diabetes mellitus with diabetic polyneuropathy: Secondary | ICD-10-CM

## 2019-11-21 DIAGNOSIS — E669 Obesity, unspecified: Secondary | ICD-10-CM

## 2019-11-21 DIAGNOSIS — E11621 Type 2 diabetes mellitus with foot ulcer: Secondary | ICD-10-CM

## 2019-11-21 DIAGNOSIS — M86172 Other acute osteomyelitis, left ankle and foot: Secondary | ICD-10-CM

## 2019-11-21 LAB — HEMOGLOBIN A1C
Hgb A1c MFr Bld: 11.7 % — ABNORMAL HIGH (ref 4.8–5.6)
Mean Plasma Glucose: 289.09 mg/dL

## 2019-11-21 LAB — LIPID PANEL
Cholesterol: 136 mg/dL (ref 0–200)
HDL: 24 mg/dL — ABNORMAL LOW (ref >40)
LDL Cholesterol: 86 mg/dL (ref 0–99)
Total CHOL/HDL Ratio: 5.7 RATIO
Triglycerides: 132 mg/dL (ref <150)
VLDL: 26 mg/dL (ref 0–40)

## 2019-11-21 LAB — GLUCOSE, CAPILLARY: Glucose-Capillary: 191 mg/dL — ABNORMAL HIGH (ref 70–99)

## 2019-11-21 MED ORDER — ASPIRIN EC 81 MG PO TBEC: 81.0000 mg | DELAYED_RELEASE_TABLET | Freq: Two times a day (BID) | ORAL | 0 refills | Status: DC

## 2019-11-21 MED ORDER — DOCUSATE SODIUM 100 MG PO CAPS: 100.0000 mg | ORAL_CAPSULE | Freq: Two times a day (BID) | ORAL | 0 refills | Status: DC

## 2019-11-21 MED ORDER — AMOXICILLIN-POT CLAVULANATE 875-125 MG PO TABS: 1.0000 | ORAL_TABLET | Freq: Two times a day (BID) | ORAL | Status: DC

## 2019-11-21 MED ORDER — FAMOTIDINE 20 MG PO TABS
20.0000 mg | ORAL_TABLET | Freq: Every day | ORAL | Status: DC
  Administered 2019-11-21 – 2019-11-24 (×4): 20 mg via ORAL
  Filled 2019-11-21 (×4): qty 1

## 2019-11-21 MED ORDER — INFLUENZA VAC SPLIT QUAD 0.5 ML IM SUSY
0.5000 mL | PREFILLED_SYRINGE | INTRAMUSCULAR | Status: AC
  Administered 2019-11-23: 0.5 mL via INTRAMUSCULAR
  Filled 2019-11-21: qty 0.5

## 2019-11-21 MED ORDER — OXYCODONE HCL 5 MG PO TABS: 5.0000 mg | ORAL_TABLET | ORAL | 0 refills | Status: AC | PRN

## 2019-11-21 MED ORDER — METRONIDAZOLE 500 MG PO TABS
500.0000 mg | ORAL_TABLET | Freq: Two times a day (BID) | ORAL | Status: DC
  Administered 2019-11-21 – 2019-11-24 (×7): 500 mg via ORAL
  Filled 2019-11-21 (×7): qty 1

## 2019-11-21 MED ORDER — SENNA 8.6 MG PO TABS: 2.0000 | ORAL_TABLET | Freq: Two times a day (BID) | ORAL | 0 refills | Status: DC

## 2019-11-21 MED ORDER — SODIUM CHLORIDE 0.9 % IV SOLN
2.0000 g | INTRAVENOUS | Status: DC
  Administered 2019-11-21 – 2019-11-24 (×4): 2 g via INTRAVENOUS
  Filled 2019-11-21 (×4): qty 20

## 2019-11-21 NOTE — Plan of Care (Signed)

## 2019-11-21 NOTE — Progress Notes (Signed)
Pharmacy Antibiotic Note  Danny Fuller is a 44 y.o. male admitted on 11/20/2019 with diabetic foot infection.  Pharmacy has been consulted for Cefepime dosing.  ID: L foot DFI/osteo; S/P L TM amputation on 10/7. Scr 1.01. WBC WNL  10/7: Tissue>>IP  10/7 Cefepime>>   Plan: Cefepime 2g IV q8hr. Still needed after amputation? Pharmacy will sign off. Please reconsult for further dosing assitance.    Height: 5\' 10"  (177.8 cm) Weight: 120.2 kg (265 lb) IBW/kg (Calculated) : 73  Temp (24hrs), Avg:98.1 F (36.7 C), Min:97.7 F (36.5 C), Max:98.5 F (36.9 C)  Recent Labs  Lab 11/20/19 1233  WBC 9.1  CREATININE 1.01    Estimated Creatinine Clearance: 123.8 mL/min (by C-G formula based on SCr of 1.01 mg/dL).    Allergies  Allergen Reactions  . Augmentin [Amoxicillin-Pot Clavulanate] Itching and Other (See Comments)    Dizziness  . Vancomycin Other (See Comments)    AKI     Danny Fuller S. Alford Highland, PharmD, BCPS Clinical Staff Pharmacist [email or web address] 11/21/2019 9:26 AM

## 2019-11-21 NOTE — Evaluation (Signed)
Physical Therapy Evaluation Patient Details Name: Danny Fuller MRN: [id number] DOB: 06-15-1975 Today's Date: 11/21/2019   History of Present Illness  Danny Fuller is a 44 year old male with a past medical history significant for diabetes complicated by peripheral neuropathy.  He has a history of previous fifth ray amputation.  He has had a chronic lateral forefoot ulcer beneath the fourth metatarsal head.  MRI reveals osteomyelitis involving the entire fourth metatarsal and a large abscess and phlegmon over the dorsal lateral forefoot. Patient underwent L transmetatarsal amputation on 10/7 and is currently NWB.    Clinical Impression  PTA, pt lives with wife and 70 y/o child, ambulated with cane. Patient demonstrated good awareness of weight bearing precautions and was able to maintain them throughout session. Patient ambulated 25 feet with RW and min guard. Patient negotiated 3 stairs with RW and minA with backwards approach. Patient is limited by pain, weight bearing restrictions, decreased activity tolerance. Patient will benefit from skilled PT services in the acute setting address listed deficits. Recommend discharge home with no follow PT services.    Follow Up Recommendations No PT follow up;Supervision for mobility/OOB (will benefit from follow up OPPT with WB status change)    Equipment Recommendations  Rolling Susann Lawhorne with 5" wheels;3in1 (PT)    Recommendations for Other Services       Precautions / Restrictions Precautions Precautions: Fall Restrictions Weight Bearing Restrictions: Yes LLE Weight Bearing: Non weight bearing      Mobility  Bed Mobility Overal bed mobility: Needs Assistance Bed Mobility: Supine to Sit     Supine to sit: Supervision;HOB elevated        Transfers Overall transfer level: Needs assistance Equipment used: Rolling Marsia Cino (2 wheeled) Transfers: Sit to/from Stand Sit to Stand: Supervision         General transfer comment: min guard  initially for sit to stand, progressing to supervision  Ambulation/Gait Ambulation/Gait assistance: Min guard Gait Distance (Feet): 25 Feet Assistive device: Rolling Annisten Manchester (2 wheeled)       General Gait Details: single leg hop due to NWB status  Stairs Stairs: Yes Stairs assistance: Min assist Stair Management: Backwards;With Johne Buckle Number of Stairs: 3 General stair comments: pt instructed on safe navigation of stairs with assistance of one person to maintain position of RW.  Wheelchair Mobility    Modified Rankin (Stroke Patients Only)       Balance Overall balance assessment: Needs assistance Sitting-balance support: Feet supported;No upper extremity supported Sitting balance-Leahy Scale: Good     Standing balance support: Bilateral upper extremity supported Standing balance-Leahy Scale: Fair (able to maintain standing balance to reposition RW on stairs)                               Pertinent Vitals/Pain Pain Assessment: Faces Faces Pain Scale: Hurts little more Pain Location: L foot Pain Descriptors / Indicators: Grimacing;Guarding Pain Intervention(s): Monitored during session;Repositioned    Home Living Family/patient expects to be discharged to:: Private residence Living Arrangements: Spouse/significant other;Children Available Help at Discharge: Family Type of Home: House Home Access: Stairs to enter Entrance Stairs-Rails: None Technical brewer of Steps: 5 Home Layout: One level Home Equipment: Cane - single point Additional Comments: Pt lives with wife and 20 y/o child, is currently working and driving     Prior Function Level of Independence: Independent with assistive device(s)         Comments: ambulating with cane prior to surgery  Hand Dominance        Extremity/Trunk Assessment   Upper Extremity Assessment Upper Extremity Assessment: Overall WFL for tasks assessed    Lower Extremity Assessment Lower  Extremity Assessment: LLE deficits/detail LLE Deficits / Details: unable to assess due to WB restrictions; pt able to perform SLR to remove pillows LLE: Unable to fully assess due to immobilization       Communication   Communication: No difficulties  Cognition Arousal/Alertness: Awake/alert Behavior During Therapy: WFL for tasks assessed/performed Overall Cognitive Status: Within Functional Limits for tasks assessed                                        General Comments      Exercises     Assessment/Plan    PT Assessment Patient needs continued PT services  PT Problem List Decreased strength;Decreased activity tolerance;Decreased balance;Decreased mobility;Pain       PT Treatment Interventions Gait training;Functional mobility training;Stair training;Therapeutic activities;Therapeutic exercise;Balance training;Patient/family education    PT Goals (Current goals can be found in the Care Plan section)  Acute Rehab PT Goals Patient Stated Goal: to go home PT Goal Formulation: With patient Time For Goal Achievement: 12/05/19 Potential to Achieve Goals: Good    Frequency Min 3X/week   Barriers to discharge        Co-evaluation               AM-PAC PT "6 Clicks" Mobility  Outcome Measure Help needed turning from your back to your side while in a flat bed without using bedrails?: None Help needed moving from lying on your back to sitting on the side of a flat bed without using bedrails?: None Help needed moving to and from a bed to a chair (including a wheelchair)?: A Little Help needed standing up from a chair using your arms (e.g., wheelchair or bedside chair)?: None Help needed to walk in hospital room?: A Little Help needed climbing 3-5 steps with a railing? : A Little 6 Click Score: 21    End of Session Equipment Utilized During Treatment: Gait belt Activity Tolerance: Patient tolerated treatment well Patient left: in chair;with call  bell/phone within reach;with chair alarm set;with family/visitor present Nurse Communication: Mobility status PT Visit Diagnosis: Other abnormalities of gait and mobility (R26.89);Difficulty in walking, not elsewhere classified (R26.2);Pain Pain - Right/Left: Left Pain - part of body: Ankle and joints of foot    Time: 0833-0902 PT Time Calculation (min) (ACUTE ONLY): 29 min   Charges:   PT Evaluation $PT Eval Low Complexity: 1 Low PT Treatments $Therapeutic Activity: 8-22 mins        Perrin Maltese, PT, DPT Acute Rehabilitation Services Pager [phone] Office [phone]   Melene Plan Allred 11/21/2019, 10:24 AM

## 2019-11-21 NOTE — Consult Note (Signed)
[facility] for Infectious Disease    Date of Admission:  11/20/2019     Reason for Consult: osteomyelitis/cellulitis    Referring Provider: Doran Durand      Abx: 10/7-c cefepime        Assessment: Danny Fuller is a 44 y.o. male with DM2 on insulin, peripheral neuropathy, glaucoma, obesity, htn, hlp, chronic left foot ulcer infection with confirmed imaging evidence OM entire 4th metatarsal and abscess over dorsal lateral forefoot, failed conservative tx including abx/wound care, admitted for semielective TMA    I reviewed prior epic available micro; there is no hx mrsa or pseudomonas or mdro GNR dating back to 2017  Given the tma and disarticulation of the 4th mt, and the wound primarily closed, this suggest clean gross margin. Of note, there are cases like this with skipped lesion and more proximal areas of surrounding metatarsal could be involved that are not seen on mri. For now reasonable to finish 10 day treatment of mop up therapy for skin soft tissue cellulitis and have close monitoring   I explained at length for patient the issue with his foot progressing is usually source control where surgical debridement and excision of infected bone are required, rather than an antibiotics issue. He does report some uncertainty and also brought up the fact that augmentin had cause generalized itch and nausea with food intolerance. In this setting, I will leave him on some form of iv abx here for another 1-2 days. If clinical condition continue to improve, can switch to oral on discharge  Plan: 1. Stop cefepime; start ceftriaxone 2 gram iv daily and oral metronidazole 500 mg bid 2. In 1-2 days if continued improvement, reasonable to discharge from ID standpoint and switch all abx to cefdinir 300 mg po bid and metronidazole 500 mg po bid 3. Duration for above will be 14 days until 10/21 4. Patient should f/u ortho 5. Id will also see once in 4-6 weeks. Please look for  appointment later today (I will place order today)   6.   There is no recent vascular evaluation that I could see in epic, consider a vascular u/s study given the recurrent nature of this ulcer in the same spot   Active Problems:   Osteomyelitis of ankle and foot (HCC)   Scheduled Meds: . atorvastatin  40 mg Oral Daily  . benazepril  40 mg Oral Daily  . cloNIDine  0.1 mg Transdermal Weekly  . docusate sodium  100 mg Oral BID  . enoxaparin (LOVENOX) injection  40 mg Subcutaneous Q24H  . [START ON 11/22/2019] influenza vac split quadrivalent PF  0.5 mL Intramuscular Tomorrow-1000  . insulin pump   Subcutaneous TID WC, HS, 0200  . labetalol  100 mg Oral BID  . latanoprost  1 drop Both Eyes Daily  . nortriptyline  40 mg Oral QHS  . pregabalin  150 mg Oral TID  . senna  1 tablet Oral BID   Continuous Infusions: . sodium chloride    . ceFEPime (MAXIPIME) IV 2 g (11/21/19 0900)   PRN Meds:.acetaminophen, bisacodyl, HYDROcodone-acetaminophen, HYDROcodone-acetaminophen, magnesium citrate, metoCLOPramide **OR** metoCLOPramide (REGLAN) injection, morphine injection, ondansetron **OR** ondansetron (ZOFRAN) IV, polyethylene glycol  HPI: Danny Fuller is a 44 y.o. male with DM2 on insulin, peripheral neuropathy, ckd3, osa not on cpap, glaucoma, obesity, htn, hlp, chronic left foot ulcer infection with confirmed imaging evidence OM entire 4th metatarsal and abscess over dorsal lateral forefoot, failed conservative tx  including abx/wound care, admitted for semielective TMA   Has been having chronic recurrent ulcer left foot, the most recent episode 2 months onset 2 months prior to this admission at the 4th metatarsal plantar area. He is most recently followed by orthopedics Dr Doran Durand. I reviewed PCP note on 10/30/19. It appears he had gone through courses of oral abx most recently started on 2 week of amox-clav on that date. Per Dr Doran Durand, mri (outside facility showed abscess of forefoot and entire  4th metatarsal OM  He reports itch and oral intake intolerance on amox-clav  He denies fever/chill at home [facility] there was severe pain, redness in foot, and swelling with malodor purulent discharge from ulcer. He denies redness spreading beyond the foot proximally  His sugar also was starting to get high from mid 100s to 200s by the time he came in  Hx ID-seen issues: Fournier's gangrene 02/2016 Diabetic left foot infection 06-02-15  Procedures: 10/07  1.  Left percutaneous achilles tendon lengthening 2.  Left foot transmetatarsal amputation I reviewed operative note. 4th metatarsal was disarticulated proximally  He denies f/c or other joint pain He is afebrile this admisison He was started on cefepime after surgery  Review of Systems: ROS  Negative 11 point ros unless mentioned above  Past Medical History:  Diagnosis Date  . Asthma   . Back pain    Bulging Disk  . Chronic kidney disease    sees Dr. Jamal Maes    . Diabetes mellitus    sees Dr. Dwyane Dee   . Diabetic foot ulcers ([place])    sees Dr. Wylene Simmer   . GERD (gastroesophageal reflux disease)   . Headache(784.0)   . Hyperlipidemia   . Hypertension    sees Dr. Percival Spanish   . Sleep apnea    CPAP    Social History   Tobacco Use  . Smoking status: Former Smoker    Packs/day: 0.50    Years: 20.00    Pack years: 10.00    Types: Cigarettes    Quit date: 07/23/2015    Years since quitting: 4.3  . Smokeless tobacco: Never Used  Vaping Use  . Vaping Use: Never used  Substance Use Topics  . Alcohol use: No    Alcohol/week: 0.0 standard drinks  . Drug use: No    Family History  Problem Relation Age of Onset  . Arthritis Other   . Diabetes Other   . Hypertension Other   . Hyperlipidemia Other   . Stroke Other   . Sudden death Other   . Diabetes Mother   . Diabetes Sister   . Diabetes Brother   . Heart attack Father        Died ate 2050-06-02, couple of "heart attacks"    Allergies  Allergen Reactions  .  Augmentin [Amoxicillin-Pot Clavulanate] Itching and Other (See Comments)    Dizziness  . Vancomycin Other (See Comments)    AKI    OBJECTIVE: Blood pressure 123/82, pulse 89, temperature 98.2 F (36.8 C), temperature source Oral, resp. rate 17, height 5\' 10"  (1.778 m), weight 120.2 kg, SpO2 99 %.  Physical Exam Sitting in chair, watching tv, no distress; fully conversant Heent: normocephalic; per; conj clear Neck supple cv rrr no mrg Lungs normal respiratory effort abd s/nt Ext no edema Skin no rash Msk: left foot s/p tma; the foot up midchin is in a splint with bulky dressing intact no drainage/discharge seen Neuro cn2-12 intact, strength symmetric Psych alert/oriented  Lab Results  Lab Results  Component Value Date   WBC 9.1 11/20/2019   HGB 13.8 11/20/2019   HCT 42.0 11/20/2019   MCV 90.5 11/20/2019   PLT 454 (H) 11/20/2019    Lab Results  Component Value Date   CREATININE 1.01 11/20/2019   BUN 13 11/20/2019   NA 136 11/20/2019   K 4.3 11/20/2019   CL 101 11/20/2019   CO2 27 11/20/2019    Lab Results  Component Value Date   ALT 18 06/05/2019   AST 17 06/05/2019   ALKPHOS 75 06/05/2019   BILITOT 0.5 06/05/2019     Microbiology: Recent Results (from the past 240 hour(s))  Respiratory Panel by RT PCR (Flu A&B, Covid) - Nasopharyngeal Swab     Status: None   Collection Time: 11/20/19 10:51 AM   Specimen: Nasopharyngeal Swab  Result Value Ref Range Status   SARS Coronavirus 2 by RT PCR NEGATIVE NEGATIVE Final    Comment: (NOTE) SARS-CoV-2 target nucleic acids are NOT DETECTED.  The SARS-CoV-2 RNA is generally detectable in upper respiratoy specimens during the acute phase of infection. The lowest concentration of SARS-CoV-2 viral copies this assay can detect is 131 copies/mL. A negative result does not preclude SARS-Cov-2 infection and should not be used as the sole basis for treatment or other patient management decisions. A negative result may occur with   improper specimen collection/handling, submission of specimen other than nasopharyngeal swab, presence of viral mutation(s) within the areas targeted by this assay, and inadequate number of viral copies (<131 copies/mL). A negative result must be combined with clinical observations, patient history, and epidemiological information. The expected result is Negative.  Fact Sheet for Patients:  PinkCheek.be  Fact Sheet for Healthcare Providers:  GravelBags.it  This test is no t yet approved or cleared by the [place] FDA and  has been authorized for detection and/or diagnosis of SARS-CoV-2 by FDA under an Emergency Use Authorization (EUA). This EUA will remain  in effect (meaning this test can be used) for the duration of the COVID-19 declaration under Section 564(b)(1) of the Act, 21 U.S.C. section 360bbb-3(b)(1), unless the authorization is terminated or revoked sooner.     Influenza A by PCR NEGATIVE NEGATIVE Final   Influenza B by PCR NEGATIVE NEGATIVE Final    Comment: (NOTE) The Xpert Xpress SARS-CoV-2/FLU/RSV assay is intended as an aid in  the diagnosis of influenza from Nasopharyngeal swab specimens and  should not be used as a sole basis for treatment. Nasal washings and  aspirates are unacceptable for Xpert Xpress SARS-CoV-2/FLU/RSV  testing.  Fact Sheet for Patients: PinkCheek.be  Fact Sheet for Healthcare Providers: GravelBags.it  This test is not yet approved or cleared by the [place] FDA and  has been authorized for detection and/or diagnosis of SARS-CoV-2 by  FDA under an Emergency Use Authorization (EUA). This EUA will remain  in effect (meaning this test can be used) for the duration of the  Covid-19 declaration under Section 564(b)(1) of the Act, 21  U.S.C. section 360bbb-3(b)(1), unless the authorization is  terminated or  revoked. Performed at [facility], [place] [address] [place], [place] [address]   Aerobic/Anaerobic Culture (surgical/deep wound)     Status: None (Preliminary result)   Collection Time: 11/20/19  1:39 PM   Specimen: Soft Tissue, Other  Result Value Ref Range Status   Specimen Description TISSUE  Final   Special Requests LEFT TRANSMETARSAL  Final   Gram Stain   Final    RARE  WBC PRESENT,BOTH PMN AND MONONUCLEAR NO ORGANISMS SEEN Performed at [facility], [place] [address] [place], [place] [address]    Culture PENDING  Incomplete   Report Status PENDING  Incomplete    Imaging: none  Jabier Mutton, [facility] Infectious [place] [phone] pager    11/21/2019, 9:30 AM

## 2019-11-21 NOTE — Discharge Instructions (Addendum)
Danny Simmer, MD [facility]  Please read the following information regarding your care after surgery.  Medications  You only need a prescription for the narcotic pain medicine (ex. oxycodone, Percocet, Norco).  All of the other medicines listed below are available over the counter. X Aleve 2 pills twice a day for the first 3 days after surgery. X acetominophen (Tylenol) 650 mg every 4-6 hours as you need for minor to moderate pain X oxycodone as prescribed for severe pain X cefdinir and metronidazole as prescribed  Narcotic pain medicine (ex. oxycodone, Percocet, Vicodin) will cause constipation.  To prevent this problem, take the following medicines while you are taking any pain medicine. X docusate sodium (Colace) 100 mg twice a day X senna (Senokot) 2 tablets twice a day  X To help prevent blood clots, take a baby aspirin (81 mg) twice a day for two weeks after surgery.  You should also get up every hour while you are awake to move around.    Weight Bearing X Do not bear any weight on the operated leg or foot.  Cast / Splint / Dressing X Keep your splint, cast or dressing clean and dry.  Don't put anything (coat hanger, pencil, etc) down inside of it.  If it gets damp, use a hair dryer on the cool setting to dry it.  If it gets soaked, call the office to schedule an appointment for a cast change.   After your dressing, cast or splint is removed; you may shower, but do not soak or scrub the wound.  Allow the water to run over it, and then gently pat it dry.  Swelling It is normal for you to have swelling where you had surgery.  To reduce swelling and pain, keep your toes above your nose for at least 3 days after surgery.  It may be necessary to keep your foot or leg elevated for several weeks.  If it hurts, it should be elevated.  Follow Up Call my office at [phone] when you are discharged from the hospital or surgery center to schedule an appointment to be seen two weeks after  surgery.  Call my office at [phone] if you develop a fever >101.5 F, nausea, vomiting, bleeding from the surgical site or severe pain.

## 2019-11-21 NOTE — Progress Notes (Signed)
Subjective: 1 Day Post-Op Procedure(s) (LRB): TRANSMETATARSAL AMPUTATION LEFT FOOT (Left)  Patient reports pain as mild to moderate.  Tolerating POs well.  Admits to flatus.  Denies fever, chills, N/V, CP, SOB.  Objective:   VITALS:  Temp:  [97.7 F (36.5 C)-98.5 F (36.9 C)] 98 F (36.7 C) (10/08 0347) Pulse Rate:  [79-98] 87 (10/08 0347) Resp:  [10-20] 16 (10/08 0347) BP: (122-185)/(79-107) 122/79 (10/08 0347) SpO2:  [96 %-100 %] 98 % (10/08 0347) Weight:  [120.2 kg] 120.2 kg (10/07 1052)  General: WDWN patient in NAD. Psych:  Appropriate mood and affect. Neuro:  A&O x 3, Moving all extremities, sensation intact to light touch HEENT:  EOMs intact Chest:  Even non-labored respirations Skin:  Dressing/SLS C/D/I, no rashes or lesions Extremities: warm/dry, no visible edema, erythema or echymosis.  No lymphadenopathy. Pulses: Popliteus 2+ MSK:  ROM: TKE, MMT: able to perform quad set    LABS Recent Labs    11/20/19 1233  HGB 13.8  WBC 9.1  PLT 454*   Recent Labs    11/20/19 1233  NA 136  K 4.3  CL 101  CO2 27  BUN 13  CREATININE 1.01  GLUCOSE 148*   No results for input(s): LABPT, INR in the last 72 hours.   Assessment/Plan: 1 Day Post-Op Procedure(s) (LRB): TRANSMETATARSAL AMPUTATION LEFT FOOT (Left)  NWB L LE Up with therapy Intra-op cultures pending ABX per ID recommendation After searching [place] PMP Aware the patient is provided a Rx for oxycodone. D/C scripts for pain and DVT ppx sent to patient's pharmacy DVT ppx: lovenox in house; 81 mg ASA BID upon D/C Plan for 2 week outpatient post-op visit with Dr. Doran Durand.  [name] PA-C [facility] Office:  [phone]

## 2019-11-21 NOTE — Plan of Care (Signed)
  Problem: Education: Goal: Knowledge of General Education information will improve Description: Including pain rating scale, medication(s)/side effects and non-pharmacologic comfort measures Outcome: Progressing   Problem: Health Behavior/Discharge Planning: Goal: Ability to manage health-related needs will improve Outcome: Progressing   Problem: Activity: Goal: Risk for activity intolerance will decrease Outcome: Progressing   Problem: Coping: Goal: Level of anxiety will decrease Outcome: Progressing   Problem: Safety: Goal: Ability to remain free from injury will improve Outcome: Progressing   Problem: Skin Integrity: Goal: Risk for impaired skin integrity will decrease Outcome: Progressing

## 2019-11-21 NOTE — Progress Notes (Signed)
PROGRESS NOTE                                                                                                                                                                                                             Patient Demographics:    Danny Fuller, is a 44 y.o. male, DOB - 1975-09-10, HBZ:[id number]  Admit date - 11/20/2019   Admitting Physician Wylene Simmer, MD  Outpatient Primary MD for the patient is [name], MD  LOS - 1   No chief complaint on file.      Brief Narrative   44 y.o. 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  He underwent left transmetatarsal amputation by Dr. Doran Durand, and we were consulted for help managing his uncontrolled diabetes, hypertension and other comorbidities.    Subjective:    Danny Fuller today denies any complaints overnight, he had his insulin pump delivered yesterday, where he has been using since.     Assessment  & Plan :    Active Problems:   Type 2 diabetes mellitus with diabetic neuropathy, with long-term current use of insulin (HCC)   Essential hypertension   BMI 33.0-33.9,adult   OSA (obstructive sleep apnea)   Dyslipidemia   Osteomyelitis of ankle and foot (HCC)  Left foot osteomyelitis/diabetic ulcer/abscess-failed outpatient treatment with oral antibiotic twice -S/p left foot transmetatarsal amputation by Dr. Doran Durand -Antibiotics management per ID, currently on ceftriaxone 2 g IV daily and oral metronidazole 500 mg 3 times daily, then can be transitioned to cefdinir 300 mg p.o. twice daily and metronidazole 5 mg p.o. 3 times daily for total of 14 days treatment until 10/21. -Follow with ID as an outpatient in 4 to 6 weeks. -Most recent ABI in 2017, with ABI and Doppler waveforms  are within normal limits bilaterally at rest.   Uncontrolled IDDM-2 with hyperglycemia/neuropathy/diabetic foot ulcer: A1c 11.7% on 10/29/2019. On insulin pump at home -He is following with Dr. Dwyane Dee as an outpatient, continue with insulin pump.  Hyperlipidemia -Continue with statin  Uncontrolled hypertension:  -BP improved.  On clonidine, benazepril and Lasix at home.  Seems he is followed by cardiology. -Continue home medications-clonidine patch, benazepril  and labetalol  OSA not on CPAP -Patient reported not using CPAP at home, reports he is a scheduled for another sleep study as an outpatient.  Morbid obesity: BMI of 38.02 with diabetes. -Already on Victoza -Encourage lifestyle change to lose weight.    COVID-19 Labs  Recent Labs    11/20/19 2105  CRP 1.5*    Lab Results  Component Value Date   [place] NEGATIVE 11/20/2019     Code Status : full   Procedures  :   Procedures: 10/07  1. Left percutaneous achilles tendon lengthening 2. Left foot transmetatarsal amputation  DVT Prophylaxis  :  lovenox  Lab Results  Component Value Date   PLT 454 (H) 11/20/2019    Antibiotics  :    Anti-infectives (From admission, onward)   Start     Dose/Rate Route Frequency Ordered Stop   11/21/19 1100  cefTRIAXone (ROCEPHIN) 2 g in sodium chloride 0.9 % 100 mL IVPB        2 g 200 mL/hr over 30 Minutes Intravenous Every 24 hours 11/21/19 1019     11/21/19 1100  metroNIDAZOLE (FLAGYL) tablet 500 mg        500 mg Oral Every 12 hours 11/21/19 1019     11/21/19 1030  amoxicillin-clavulanate (AUGMENTIN) 875-125 MG per tablet 1 tablet  Status:  Discontinued        1 tablet Oral Every 12 hours 11/21/19 0951 11/21/19 1019   11/20/19 1600  ceFEPIme (MAXIPIME) 2 g in sodium chloride 0.9 % 100 mL IVPB  Status:  Discontinued        2 g 200 mL/hr over 30 Minutes Intravenous Every 8 hours 11/20/19 1544 11/21/19 0951   11/20/19 1401  tobramycin (NEBCIN) powder  Status:   Discontinued          As needed 11/20/19 1401 11/20/19 1427   11/20/19 1200  ceFAZolin (ANCEF) 3 g in dextrose 5 % 50 mL IVPB        3 g 100 mL/hr over 30 Minutes Intravenous On call to O.R. 11/20/19 1053 11/20/19 1343   11/20/19 1057  ceFAZolin (ANCEF) 2-4 GM/100ML-% IVPB       Note to Pharmacy: Nyoka Cowden   : cabinet override      11/20/19 1057 11/20/19 2259        Objective:   Vitals:   11/20/19 1955 11/21/19 0347 11/21/19 0729 11/21/19 0944  BP: 138/82 122/79 123/82 123/82  Pulse: 90 87 89 89  Resp: 17 16 17    Temp: 98.5 F (36.9 C) 98 F (36.7 C) 98.2 F (36.8 C)   TempSrc: Oral Oral Oral   SpO2: 99% 98% 99%   Weight:      Height:        Wt Readings from Last 3 Encounters:  11/20/19 120.2 kg  11/03/19 120.2 kg  10/30/19 120.4 kg     Intake/Output Summary (Last 24 hours) at 11/21/2019 1340 Last data filed at 11/21/2019 0818 Gross per 24 hour  Intake 850 ml  Output 460 ml  Net 390 ml     Physical Exam  Awake Alert, Oriented X 3, No new F.N deficits, Normal affect Symmetrical Chest wall movement, Good air movement bilaterally, CTAB RRR,No Gallops,Rubs or new Murmurs, No Parasternal Heave +ve B.Sounds, Abd Soft, No tenderness,  No rebound - guarding or rigidity. Left foot status post TMA, currently bandaged with a splint and bulky dressing   Data Review:    CBC Recent Labs  Lab 11/20/19 1233  WBC 9.1  HGB 13.8  HCT 42.0  PLT 454*  MCV 90.5  MCH 29.7  MCHC 32.9  RDW 11.9    Chemistries  Recent Labs  Lab 11/20/19 1233  NA 136  K 4.3  CL 101  CO2 27  GLUCOSE 148*  BUN 13  CREATININE 1.01  CALCIUM 9.0   ------------------------------------------------------------------------------------------------------------------ Recent Labs    11/21/19 0457  CHOL 136  HDL 24*  LDLCALC 86  TRIG 132  CHOLHDL 5.7    Lab Results  Component Value Date   HGBA1C 11.7 (H) 11/21/2019    ------------------------------------------------------------------------------------------------------------------ No results for input(s): TSH, T4TOTAL, T3FREE, THYROIDAB in the last 72 hours.  Invalid input(s): FREET3 ------------------------------------------------------------------------------------------------------------------ No results for input(s): VITAMINB12, FOLATE, FERRITIN, TIBC, IRON, RETICCTPCT in the last 72 hours.  Coagulation profile No results for input(s): INR, PROTIME in the last 168 hours.  No results for input(s): DDIMER in the last 72 hours.  Cardiac Enzymes No results for input(s): CKMB, TROPONINI, MYOGLOBIN in the last 168 hours.  Invalid input(s): CK ------------------------------------------------------------------------------------------------------------------    Component Value Date/Time   BNP 169.6 (H) 02/07/2017 1552    Inpatient Medications  Scheduled Meds: . atorvastatin  40 mg Oral Daily  . benazepril  40 mg Oral Daily  . cloNIDine  0.1 mg Transdermal Weekly  . docusate sodium  100 mg Oral BID  . enoxaparin (LOVENOX) injection  40 mg Subcutaneous Q24H  . [START ON 11/22/2019] influenza vac split quadrivalent PF  0.5 mL Intramuscular Tomorrow-1000  . insulin pump   Subcutaneous TID WC, HS, 0200  . labetalol  100 mg Oral BID  . latanoprost  1 drop Both Eyes Daily  . metroNIDAZOLE  500 mg Oral Q12H  . nortriptyline  40 mg Oral QHS  . pregabalin  150 mg Oral TID  . senna  1 tablet Oral BID   Continuous Infusions: . sodium chloride    . cefTRIAXone (ROCEPHIN)  IV 2 g (11/21/19 1058)   PRN Meds:.acetaminophen, bisacodyl, HYDROcodone-acetaminophen, HYDROcodone-acetaminophen, magnesium citrate, metoCLOPramide **OR** metoCLOPramide (REGLAN) injection, morphine injection, ondansetron **OR** ondansetron (ZOFRAN) IV, polyethylene glycol  Micro Results Recent Results (from the past 240 hour(s))  Respiratory Panel by RT PCR (Flu A&B, Covid) -  Nasopharyngeal Swab     Status: None   Collection Time: 11/20/19 10:51 AM   Specimen: Nasopharyngeal Swab  Result Value Ref Range Status   SARS Coronavirus 2 by RT PCR NEGATIVE NEGATIVE Final    Comment: (NOTE) SARS-CoV-2 target nucleic acids are NOT DETECTED.  The SARS-CoV-2 RNA is generally detectable in upper respiratoy specimens during the acute phase of infection. The lowest concentration of SARS-CoV-2 viral copies this assay can detect is 131 copies/mL. A negative result does not preclude SARS-Cov-2 infection and should not be used as the sole basis for treatment or other patient management decisions. A negative result may occur with  improper specimen collection/handling, submission of specimen other than nasopharyngeal swab, presence of viral mutation(s) within the areas targeted by this assay, and inadequate number of viral copies (<131 copies/mL). A negative result must be combined with clinical observations, patient history, and epidemiological information. The expected result is Negative.  Fact Sheet for Patients:  PinkCheek.be  Fact Sheet for Healthcare Providers:  GravelBags.it  This test is no t yet approved or cleared by the [place] FDA and  has been authorized for detection and/or diagnosis of SARS-CoV-2 by FDA under an Emergency Use Authorization (EUA). This EUA will remain  in effect (meaning this test can  be used) for the duration of the COVID-19 declaration under Section 564(b)(1) of the Act, 21 U.S.C. section 360bbb-3(b)(1), unless the authorization is terminated or revoked sooner.     Influenza A by PCR NEGATIVE NEGATIVE Final   Influenza B by PCR NEGATIVE NEGATIVE Final    Comment: (NOTE) The Xpert Xpress SARS-CoV-2/FLU/RSV assay is intended as an aid in  the diagnosis of influenza from Nasopharyngeal swab specimens and  should not be used as a sole basis for treatment. Nasal washings and    aspirates are unacceptable for Xpert Xpress SARS-CoV-2/FLU/RSV  testing.  Fact Sheet for Patients: PinkCheek.be  Fact Sheet for Healthcare Providers: GravelBags.it  This test is not yet approved or cleared by the [place] FDA and  has been authorized for detection and/or diagnosis of SARS-CoV-2 by  FDA under an Emergency Use Authorization (EUA). This EUA will remain  in effect (meaning this test can be used) for the duration of the  Covid-19 declaration under Section 564(b)(1) of the Act, 21  U.S.C. section 360bbb-3(b)(1), unless the authorization is  terminated or revoked. Performed at [facility], [place] [address], [place], [place] [address]   Aerobic/Anaerobic Culture (surgical/deep wound)     Status: None (Preliminary result)   Collection Time: 11/20/19  1:39 PM   Specimen: Soft Tissue, Other  Result Value Ref Range Status   Specimen Description TISSUE  Final   Special Requests LEFT TRANSMETARSAL  Final   Gram Stain   Final    RARE WBC PRESENT,BOTH PMN AND MONONUCLEAR NO ORGANISMS SEEN    Culture   Final    NO GROWTH < 24 HOURS Performed at [facility], [place] [address] [place], [place] [address]    Report Status PENDING  Incomplete    Radiology Reports No results found.    Phillips Climes M.D on 11/21/2019 at 1:40 PM    [facility] -  Office  [phone]

## 2019-11-21 NOTE — Progress Notes (Signed)
Inpatient Diabetes Program Recommendations  AACE/ADA: New Consensus Statement on Inpatient Glycemic Control (2015)  Target Ranges:  Prepandial:   less than 140 mg/dL      Peak postprandial:   less than 180 mg/dL (1-2 hours)      Critically ill patients:  140 - 180 mg/dL   Lab Results  Component Value Date   GLUCAP 191 (H) 11/21/2019   HGBA1C 11.7 (H) 11/21/2019    Review of Glycemic Control  Insulin pump restarted.  Long discussion regarding HgbA1C of 11.7%. Pt states he's had problems with getting Pods on time and has gone 2 weeks with only Novolog insulin, which didn't keep blood sugars down. Explained to pt that he needed both long-acting and rapid-acting insulin if pump is not on. Pt states he will call Dr Dwyane Dee for appt to have rates changed for  U-500 prescription. Pt said he eats healthy most of the time, and understands he needs to get HgbA1C down to approximately 7%. Answered all questions.  Thank you. Lorenda Peck, RD, LDN, CDE Inpatient Diabetes Coordinator [phone]

## 2019-11-22 DIAGNOSIS — E1140 Type 2 diabetes mellitus with diabetic neuropathy, unspecified: Secondary | ICD-10-CM

## 2019-11-22 DIAGNOSIS — Z794 Long term (current) use of insulin: Secondary | ICD-10-CM

## 2019-11-22 DIAGNOSIS — E119 Type 2 diabetes mellitus without complications: Secondary | ICD-10-CM

## 2019-11-22 LAB — CBC
HCT: 36.4 % — ABNORMAL LOW (ref 39.0–52.0)
Hemoglobin: 11.8 g/dL — ABNORMAL LOW (ref 13.0–17.0)
MCH: 29.6 pg (ref 26.0–34.0)
MCHC: 32.4 g/dL (ref 30.0–36.0)
MCV: 91.2 fL (ref 80.0–100.0)
Platelets: 367 10*3/uL (ref 150–400)
RBC: 3.99 MIL/uL — ABNORMAL LOW (ref 4.22–5.81)
RDW: 12.3 % (ref 11.5–15.5)
WBC: 8.8 10*3/uL (ref 4.0–10.5)
nRBC: 0 % (ref 0.0–0.2)

## 2019-11-22 LAB — BASIC METABOLIC PANEL
Anion gap: 9 (ref 5–15)
BUN: 12 mg/dL (ref 6–20)
CO2: 22 mmol/L (ref 22–32)
Calcium: 8.5 mg/dL — ABNORMAL LOW (ref 8.9–10.3)
Chloride: 105 mmol/L (ref 98–111)
Creatinine, Ser: 1.05 mg/dL (ref 0.61–1.24)
GFR, Estimated: >60 mL/min (ref >60)
Glucose, Bld: 110 mg/dL — ABNORMAL HIGH (ref 70–99)
Potassium: 4 mmol/L (ref 3.5–5.1)
Sodium: 136 mmol/L (ref 135–145)

## 2019-11-22 NOTE — Progress Notes (Signed)
Patient ID: Danny Fuller, male   DOB: 19-Oct-1975, 44 y.o.   MRN: [id number]  PROGRESS NOTE    Danny Fuller  MPN:[id number] DOB: Jun 12, 1975 DOA: 11/20/2019 PCP: Danny Morale, MD   Brief Narrative:  44 year old male with history of diabetes mellitus type 2, neuropathy and left foot ulcer, CKD stage IIIa, OSA not on CPAP, morbid obesity, hypertension and hyperlipidemia was admitted under orthopedic surgery for infected right foot ulcer after he failed outpatient treatment for IV antibiotics.  He underwent left transmetatarsal amputation by Danny Fuller.  Subsequently ID and hospitalist service were consulted.  Hospitalist was consulted to help manage uncontrolled diabetes, hypertension and other comorbidities.  Assessment & Plan:   Diabetes mellitus type 2 uncontrolled with hyperglycemia/neuropathy/diabetic foot ulcer -A1c 11.7.  Blood sugars currently stable.  Continue insulin pump.  Outpatient follow-up with endocrinology  Left foot osteomyelitis/infected diabetic foot ulcer with failed outpatient antibiotic treatment -Status post left foot transmetatarsal amputation by Danny Fuller.  Currently on IV antibiotics as per ID recommendations with plan for oral antibiotics on discharge -Care as per primary orthopedics team  Hypertension -Blood pressure on the higher side but improving.  Continue benazepril, clonidine, labetalol  Hyperlipidemia -Continue statin  OSA not on CPAP --Patient reported not using CPAP at home, reports he is a scheduled for another sleep study as an outpatient.  Morbid obesity -Outpatient follow-up   Subjective: Patient seen and examined at bedside.  Complains of left foot pain.  No overnight fever, nausea or vomiting. Objective: Vitals:   11/21/19 2005 11/22/19 0436 11/22/19 0453 11/22/19 0734  BP: (!) 147/92 (!) 166/98 (!) 143/103 (!) 155/99  Pulse: (!) 106 98  97  Resp: 15 15  17   Temp: 97.6 F (36.4 C) (!) 97.4 F (36.3 C)  97.9 F (36.6 C)    TempSrc: Oral Oral  Oral  SpO2: 99% 97%  99%  Weight:      Height:        Intake/Output Summary (Last 24 hours) at 11/22/2019 1002 Last data filed at 11/22/2019 0500 Gross per 24 hour  Intake 410 ml  Output 1500 ml  Net -1090 ml   Filed Weights   11/20/19 1052  Weight: 120.2 kg    Examination:  General exam: Appears calm and comfortable  Respiratory system: Bilateral decreased breath sounds at bases Cardiovascular system: S1 & S2 heard, Rate controlled Gastrointestinal system: Abdomen is morbidly obese, nondistended, soft and nontender. Normal bowel sounds heard. Extremities: No cyanosis, clubbing, edema.  Left foot dressing present  Data Reviewed: I have personally reviewed following labs and imaging studies  CBC: Recent Labs  Lab 11/20/19 1233 11/22/19 0343  WBC 9.1 8.8  HGB 13.8 11.8*  HCT 42.0 36.4*  MCV 90.5 91.2  PLT 454* 008   Basic Metabolic Panel: Recent Labs  Lab 11/20/19 1233 11/22/19 0343  NA 136 136  K 4.3 4.0  CL 101 105  CO2 27 22  GLUCOSE 148* 110*  BUN 13 12  CREATININE 1.01 1.05  CALCIUM 9.0 8.5*   GFR: Estimated Creatinine Clearance: 119.1 mL/min (by C-G formula based on SCr of 1.05 mg/dL). Liver Function Tests: No results for input(s): AST, ALT, ALKPHOS, BILITOT, PROT, ALBUMIN in the last 168 hours. No results for input(s): LIPASE, AMYLASE in the last 168 hours. No results for input(s): AMMONIA in the last 168 hours. Coagulation Profile: No results for input(s): INR, PROTIME in the last 168 hours. Cardiac Enzymes: No results for input(s): CKTOTAL, CKMB, CKMBINDEX, TROPONINI in  the last 168 hours. BNP (last 3 results) No results for input(s): PROBNP in the last 8760 hours. HbA1C: Recent Labs    11/21/19 0457  HGBA1C 11.7*   CBG: Recent Labs  Lab 11/20/19 1256 11/20/19 1430 11/20/19 1717 11/20/19 2019 11/21/19 0158  GLUCAP 131* 162* 153* 172* 191*   Lipid Profile: Recent Labs    11/21/19 0457  CHOL 136  HDL 24*   LDLCALC 86  TRIG 132  CHOLHDL 5.7   Thyroid Function Tests: No results for input(s): TSH, T4TOTAL, FREET4, T3FREE, THYROIDAB in the last 72 hours. Anemia Panel: No results for input(s): VITAMINB12, FOLATE, FERRITIN, TIBC, IRON, RETICCTPCT in the last 72 hours. Sepsis Labs: No results for input(s): PROCALCITON, LATICACIDVEN in the last 168 hours.  Recent Results (from the past 240 hour(s))  Respiratory Panel by RT PCR (Flu A&B, Covid) - Nasopharyngeal Swab     Status: None   Collection Time: 11/20/19 10:51 AM   Specimen: Nasopharyngeal Swab  Result Value Ref Range Status   SARS Coronavirus 2 by RT PCR NEGATIVE NEGATIVE Final    Comment: (NOTE) SARS-CoV-2 target nucleic acids are NOT DETECTED.  The SARS-CoV-2 RNA is generally detectable in upper respiratoy specimens during the acute phase of infection. The lowest concentration of SARS-CoV-2 viral copies this assay can detect is 131 copies/mL. A negative result does not preclude SARS-Cov-2 infection and should not be used as the sole basis for treatment or other patient management decisions. A negative result may occur with  improper specimen collection/handling, submission of specimen other than nasopharyngeal swab, presence of viral mutation(s) within the areas targeted by this assay, and inadequate number of viral copies (<131 copies/mL). A negative result must be combined with clinical observations, patient history, and epidemiological information. The expected result is Negative.  Fact Sheet for Patients:  PinkCheek.be  Fact Sheet for Healthcare Providers:  GravelBags.it  This test is no t yet approved or cleared by the [place] FDA and  has been authorized for detection and/or diagnosis of SARS-CoV-2 by FDA under an Emergency Use Authorization (EUA). This EUA will remain  in effect (meaning this test can be used) for the duration of the COVID-19 declaration  under Section 564(b)(1) of the Act, 21 U.S.C. section 360bbb-3(b)(1), unless the authorization is terminated or revoked sooner.     Influenza A by PCR NEGATIVE NEGATIVE Final   Influenza B by PCR NEGATIVE NEGATIVE Final    Comment: (NOTE) The Xpert Xpress SARS-CoV-2/FLU/RSV assay is intended as an aid in  the diagnosis of influenza from Nasopharyngeal swab specimens and  should not be used as a sole basis for treatment. Nasal washings and  aspirates are unacceptable for Xpert Xpress SARS-CoV-2/FLU/RSV  testing.  Fact Sheet for Patients: PinkCheek.be  Fact Sheet for Healthcare Providers: GravelBags.it  This test is not yet approved or cleared by the [place] FDA and  has been authorized for detection and/or diagnosis of SARS-CoV-2 by  FDA under an Emergency Use Authorization (EUA). This EUA will remain  in effect (meaning this test can be used) for the duration of the  Covid-19 declaration under Section 564(b)(1) of the Act, 21  U.S.C. section 360bbb-3(b)(1), unless the authorization is  terminated or revoked. Performed at [facility], [place] [address] [place], [place] [address]   Aerobic/Anaerobic Culture (surgical/deep wound)     Status: None (Preliminary result)   Collection Time: 11/20/19  1:39 PM   Specimen: Soft Tissue, Other  Result Value Ref Range Status  Specimen Description TISSUE  Final   Special Requests LEFT TRANSMETARSAL  Final   Gram Stain   Final    RARE WBC PRESENT,BOTH PMN AND MONONUCLEAR NO ORGANISMS SEEN    Culture   Final    NO GROWTH < 24 HOURS Performed at [facility], [place] [address] [place], [place] [address]    Report Status PENDING  Incomplete         Radiology Studies: No results found.      Scheduled Meds: . atorvastatin  40 mg Oral Daily  . benazepril  40 mg Oral Daily  . cloNIDine  0.1 mg Transdermal Weekly  . docusate sodium  100 mg Oral BID  .  enoxaparin (LOVENOX) injection  40 mg Subcutaneous Q24H  . famotidine  20 mg Oral Daily  . influenza vac split quadrivalent PF  0.5 mL Intramuscular Tomorrow-1000  . insulin pump   Subcutaneous TID WC, HS, 0200  . labetalol  100 mg Oral BID  . latanoprost  1 drop Both Eyes Daily  . metroNIDAZOLE  500 mg Oral Q12H  . nortriptyline  40 mg Oral QHS  . pregabalin  150 mg Oral TID  . senna  1 tablet Oral BID   Continuous Infusions: . sodium chloride    . cefTRIAXone (ROCEPHIN)  IV 2 g (11/21/19 1058)          Aline August, MD [facility] 11/22/2019, 10:02 AM

## 2019-11-22 NOTE — Progress Notes (Signed)
° °  Subjective: 2 Days Post-Op Procedure(s) (LRB): TRANSMETATARSAL AMPUTATION LEFT FOOT (Left) Patient reports pain as mild.   Patient seen in rounds for Dr. Doran Durand. Patient is well, and has had no acute complaints or problems other than discomfort in the left foot. No acute events overnight. Patient is resting in bed eating breakfast this morning on exam. He reports he had significant pain in the foot overnight, but is feeling okay now. He is voiding without difficulty. Denies CP, SHOB, N/V.  We will continue therapy today.   Objective: Vital signs in last 24 hours: Temp:  [97.4 F (36.3 C)-98.2 F (36.8 C)] 97.9 F (36.6 C) (10/09 0734) Pulse Rate:  [89-106] 97 (10/09 0734) Resp:  [15-18] 17 (10/09 0734) BP: (123-166)/(82-103) 155/99 (10/09 0734) SpO2:  [97 %-99 %] 99 % (10/09 0734)  Intake/Output from previous day:  Intake/Output Summary (Last 24 hours) at 11/22/2019 0856 Last data filed at 11/22/2019 0500 Gross per 24 hour  Intake 650 ml  Output 1500 ml  Net -850 ml     Intake/Output this shift: No intake/output data recorded.  Labs: Recent Labs    11/20/19 1233 11/22/19 0343  HGB 13.8 11.8*   Recent Labs    11/20/19 1233 11/22/19 0343  WBC 9.1 8.8  RBC 4.64 3.99*  HCT 42.0 36.4*  PLT 454* 367   Recent Labs    11/20/19 1233 11/22/19 0343  NA 136 136  K 4.3 4.0  CL 101 105  CO2 27 22  BUN 13 12  CREATININE 1.01 1.05  GLUCOSE 148* 110*  CALCIUM 9.0 8.5*   No results for input(s): LABPT, INR in the last 72 hours.  Exam: General - Patient is Alert and Oriented Extremity - Neurologically intact Sensation intact distally Intact pulses distally Dressing - dressing C/D/I MSK:  ROM: Able to perform quad set  Past Medical History:  Diagnosis Date   Asthma    Back pain    Bulging Disk   Chronic kidney disease    sees Dr. Jamal Maes     Diabetes mellitus    sees Dr. Dwyane Dee    Diabetic foot ulcers [facility])    sees Dr. Wylene Simmer    GERD  (gastroesophageal reflux disease)    Headache(784.0)    Hyperlipidemia    Hypertension    sees Dr. Percival Spanish    Sleep apnea    CPAP    Assessment/Plan: 2 Days Post-Op Procedure(s) (LRB): TRANSMETATARSAL AMPUTATION LEFT FOOT (Left) Active Problems:   Type 2 diabetes mellitus with diabetic neuropathy, with long-term current use of insulin (HCC)   Essential hypertension   BMI 33.0-33.9,adult   OSA (obstructive sleep apnea)   Dyslipidemia   Osteomyelitis of ankle and foot (HCC)  Estimated body mass index is 38.02 kg/m as calculated from the following:   Height as of this encounter: 5\' 10"  (1.778 m).   Weight as of this encounter: 120.2 kg. Advance diet Up with therapy  NWB L LE Up with therapy Intra-op cultures show no growth at 24h ID recommends IV ceftriaxone and oral metronidazole over the weekend If continued improvement, will discharge on cefdinir and metronidazole  D/C scripts for pain and DVT ppx sent to patient's pharmacy DVT ppx: lovenox in house; 81 mg ASA BID upon D/C Plan for 2 week outpatient post-op visit with Dr. Doran Durand.  Griffith Citron, PA-C Orthopedic Surgery [phone] 11/22/2019, 8:56 AM

## 2019-11-22 NOTE — Progress Notes (Signed)
[place] for Infectious Disease   Reason for visit: Follow up on osteomyelitis  Interval History: no acute events.  Feels well.  No associated itching, rash or diarrhea.     Physical Exam: Constitutional:  Vitals:   11/22/19 0453 11/22/19 0734  BP: (!) 143/103 (!) 155/99  Pulse:  97  Resp:  17  Temp:  97.9 F (36.6 C)  SpO2:  99%   patient appears in NAD Respiratory: Normal respiratory effort; CTA B Cardiovascular: RRR GI: soft, nt, nd  Review of Systems: Constitutional: negative for fevers and chills Gastrointestinal: negative for nausea and diarrhea Integument/breast: negative for rash  Lab Results  Component Value Date   WBC 8.8 11/22/2019   HGB 11.8 (L) 11/22/2019   HCT 36.4 (L) 11/22/2019   MCV 91.2 11/22/2019   PLT 367 11/22/2019    Lab Results  Component Value Date   CREATININE 1.05 11/22/2019   BUN 12 11/22/2019   NA 136 11/22/2019   K 4.0 11/22/2019   CL 105 11/22/2019   CO2 22 11/22/2019    Lab Results  Component Value Date   ALT 18 06/05/2019   AST 17 06/05/2019   ALKPHOS 75 06/05/2019     Microbiology: Recent Results (from the past 240 hour(s))  Respiratory Panel by RT PCR (Flu A&B, Covid) - Nasopharyngeal Swab     Status: None   Collection Time: 11/20/19 10:51 AM   Specimen: Nasopharyngeal Swab  Result Value Ref Range Status   SARS Coronavirus 2 by RT PCR NEGATIVE NEGATIVE Final    Comment: (NOTE) SARS-CoV-2 target nucleic acids are NOT DETECTED.  The SARS-CoV-2 RNA is generally detectable in upper respiratoy specimens during the acute phase of infection. The lowest concentration of SARS-CoV-2 viral copies this assay can detect is 131 copies/mL. A negative result does not preclude SARS-Cov-2 infection and should not be used as the sole basis for treatment or other patient management decisions. A negative result may occur with  improper specimen collection/handling, submission of specimen other than nasopharyngeal swab,  presence of viral mutation(s) within the areas targeted by this assay, and inadequate number of viral copies (<131 copies/mL). A negative result must be combined with clinical observations, patient history, and epidemiological information. The expected result is Negative.  Fact Sheet for Patients:  PinkCheek.be  Fact Sheet for Healthcare Providers:  GravelBags.it  This test is no t yet approved or cleared by the [place] FDA and  has been authorized for detection and/or diagnosis of SARS-CoV-2 by FDA under an Emergency Use Authorization (EUA). This EUA will remain  in effect (meaning this test can be used) for the duration of the COVID-19 declaration under Section 564(b)(1) of the Act, 21 U.S.C. section 360bbb-3(b)(1), unless the authorization is terminated or revoked sooner.     Influenza A by PCR NEGATIVE NEGATIVE Final   Influenza B by PCR NEGATIVE NEGATIVE Final    Comment: (NOTE) The Xpert Xpress SARS-CoV-2/FLU/RSV assay is intended as an aid in  the diagnosis of influenza from Nasopharyngeal swab specimens and  should not be used as a sole basis for treatment. Nasal washings and  aspirates are unacceptable for Xpert Xpress SARS-CoV-2/FLU/RSV  testing.  Fact Sheet for Patients: PinkCheek.be  Fact Sheet for Healthcare Providers: GravelBags.it  This test is not yet approved or cleared by the [place] FDA and  has been authorized for detection and/or diagnosis of SARS-CoV-2 by  FDA under an Emergency Use Authorization (EUA). This EUA will remain  in effect (  meaning this test can be used) for the duration of the  Covid-19 declaration under Section 564(b)(1) of the Act, 21  U.S.C. section 360bbb-3(b)(1), unless the authorization is  terminated or revoked. Performed at [facility], [place] [address] [place], [place] [address]   Aerobic/Anaerobic  Culture (surgical/deep wound)     Status: None (Preliminary result)   Collection Time: 11/20/19  1:39 PM   Specimen: Soft Tissue, Other  Result Value Ref Range Status   Specimen Description TISSUE  Final   Special Requests LEFT TRANSMETARSAL  Final   Gram Stain   Final    RARE WBC PRESENT,BOTH PMN AND MONONUCLEAR NO ORGANISMS SEEN    Culture   Final    HOLDING FOR POSSIBLE ANAEROBE Performed at [facility], [address], [place], [place] [address]    Report Status PENDING  Incomplete    Impression/Plan:  1. Osteomyelitis - s/p TM amputation and good gross margins.  Culture remains negative.   Plan for cefdinir and flagyl at discharge through 10/21.   He has follow up with Dr. Gale Journey arranged.   [place] for discharge from [place] standpoint  2.  DM - I discussed the need to greatly improve his Hgb A1c.  He has a plan and understanding of the importance.   I will sign off, call with questions

## 2019-11-23 LAB — AEROBIC/ANAEROBIC CULTURE W GRAM STAIN (SURGICAL/DEEP WOUND)

## 2019-11-23 NOTE — Plan of Care (Signed)

## 2019-11-23 NOTE — Progress Notes (Signed)
Patient ID: Danny Fuller, male   DOB: 1975-05-11, 44 y.o.   MRN: [id number]  PROGRESS NOTE    Danny Fuller  TMH:[id number] DOB: September 13, 1975 DOA: 11/20/2019 PCP: [name], MD   Brief Narrative:  44 year old male with history of diabetes mellitus type 2, neuropathy and left foot ulcer, CKD stage IIIa, OSA not on CPAP, morbid obesity, hypertension and hyperlipidemia was admitted under orthopedic surgery for infected right foot ulcer after he failed outpatient treatment for IV antibiotics.  He underwent left transmetatarsal amputation by Dr. Doran Durand.  Subsequently ID and hospitalist service were consulted.  Hospitalist was consulted to help manage uncontrolled diabetes, hypertension and other comorbidities.  Assessment & Plan:   Diabetes mellitus type 2 uncontrolled with hyperglycemia/neuropathy/diabetic foot ulcer -A1c 11.7.  Blood sugars currently stable.  Continue insulin pump.  Outpatient follow-up with endocrinology  Left foot osteomyelitis/infected diabetic foot ulcer with failed outpatient antibiotic treatment -Status post left foot transmetatarsal amputation by Dr. Doran Durand.  Currently on IV antibiotics as per ID recommendations with plan for oral antibiotics on discharge. -Care as per primary orthopedics team  Hypertension -Blood pressure on the higher side but improving.  Continue benazepril, clonidine, labetalol  Hyperlipidemia -Continue statin  OSA not on CPAP --Patient reported not using CPAP at home, reports he is a scheduled for another sleep study as an outpatient.  Morbid obesity -Outpatient follow-up   Subjective: Patient seen and examined at bedside.  No worsening shortness of breath, fever or vomiting reported.  Complains of intermittent left foot pain.   Objective: Vitals:   11/22/19 0734 11/22/19 1654 11/22/19 1949 11/23/19 0721  BP: (!) 155/99 136/82 (!) 166/92 (!) 143/87  Pulse: 97 97 (!) 109 90  Resp: 17 17 17 17   Temp: 97.9 F (36.6 C) 98.7 F  (37.1 C) 98.5 F (36.9 C) 98.3 F (36.8 C)  TempSrc: Oral Oral Oral Oral  SpO2: 99% 99% 100% 100%  Weight:      Height:        Intake/Output Summary (Last 24 hours) at 11/23/2019 0755 Last data filed at 11/23/2019 0500 Gross per 24 hour  Intake --  Output 1350 ml  Net -1350 ml   Filed Weights   11/20/19 1052  Weight: 120.2 kg    Examination:  General exam: No acute distress Respiratory system: Bilateral decreased breath sounds at bases, no wheezing Cardiovascular system: S1 & S2 heard, intermittently tachycardic Gastrointestinal system: Abdomen is morbidly obese, slightly distended, soft and nontender.  Bowel sounds are heard Extremities: Left foot dressing present.  Trace lower extremity edema present  Data Reviewed: I have personally reviewed following labs and imaging studies  CBC: Recent Labs  Lab 11/20/19 1233 11/22/19 0343  WBC 9.1 8.8  HGB 13.8 11.8*  HCT 42.0 36.4*  MCV 90.5 91.2  PLT 454* 921   Basic Metabolic Panel: Recent Labs  Lab 11/20/19 1233 11/22/19 0343  NA 136 136  K 4.3 4.0  CL 101 105  CO2 27 22  GLUCOSE 148* 110*  BUN 13 12  CREATININE 1.01 1.05  CALCIUM 9.0 8.5*   GFR: Estimated Creatinine Clearance: 119.1 mL/min (by C-G formula based on SCr of 1.05 mg/dL). Liver Function Tests: No results for input(s): AST, ALT, ALKPHOS, BILITOT, PROT, ALBUMIN in the last 168 hours. No results for input(s): LIPASE, AMYLASE in the last 168 hours. No results for input(s): AMMONIA in the last 168 hours. Coagulation Profile: No results for input(s): INR, PROTIME in the last 168 hours. Cardiac Enzymes: No  results for input(s): CKTOTAL, CKMB, CKMBINDEX, TROPONINI in the last 168 hours. BNP (last 3 results) No results for input(s): PROBNP in the last 8760 hours. HbA1C: Recent Labs    11/21/19 0457  HGBA1C 11.7*   CBG: Recent Labs  Lab 11/20/19 1256 11/20/19 1430 11/20/19 1717 11/20/19 2019 11/21/19 0158  GLUCAP 131* 162* 153* 172* 191*    Lipid Profile: Recent Labs    11/21/19 0457  CHOL 136  HDL 24*  LDLCALC 86  TRIG 132  CHOLHDL 5.7   Thyroid Function Tests: No results for input(s): TSH, T4TOTAL, FREET4, T3FREE, THYROIDAB in the last 72 hours. Anemia Panel: No results for input(s): VITAMINB12, FOLATE, FERRITIN, TIBC, IRON, RETICCTPCT in the last 72 hours. Sepsis Labs: No results for input(s): PROCALCITON, LATICACIDVEN in the last 168 hours.  Recent Results (from the past 240 hour(s))  Respiratory Panel by RT PCR (Flu A&B, Covid) - Nasopharyngeal Swab     Status: None   Collection Time: 11/20/19 10:51 AM   Specimen: Nasopharyngeal Swab  Result Value Ref Range Status   SARS Coronavirus 2 by RT PCR NEGATIVE NEGATIVE Final    Comment: (NOTE) SARS-CoV-2 target nucleic acids are NOT DETECTED.  The SARS-CoV-2 RNA is generally detectable in upper respiratoy specimens during the acute phase of infection. The lowest concentration of SARS-CoV-2 viral copies this assay can detect is 131 copies/mL. A negative result does not preclude SARS-Cov-2 infection and should not be used as the sole basis for treatment or other patient management decisions. A negative result may occur with  improper specimen collection/handling, submission of specimen other than nasopharyngeal swab, presence of viral mutation(s) within the areas targeted by this assay, and inadequate number of viral copies (<131 copies/mL). A negative result must be combined with clinical observations, patient history, and epidemiological information. The expected result is Negative.  Fact Sheet for Patients:  PinkCheek.be  Fact Sheet for Healthcare Providers:  GravelBags.it  This test is no t yet approved or cleared by the [place] FDA and  has been authorized for detection and/or diagnosis of SARS-CoV-2 by FDA under an Emergency Use Authorization (EUA). This EUA will remain  in effect  (meaning this test can be used) for the duration of the COVID-19 declaration under Section 564(b)(1) of the Act, 21 U.S.C. section 360bbb-3(b)(1), unless the authorization is terminated or revoked sooner.     Influenza A by PCR NEGATIVE NEGATIVE Final   Influenza B by PCR NEGATIVE NEGATIVE Final    Comment: (NOTE) The Xpert Xpress SARS-CoV-2/FLU/RSV assay is intended as an aid in  the diagnosis of influenza from Nasopharyngeal swab specimens and  should not be used as a sole basis for treatment. Nasal washings and  aspirates are unacceptable for Xpert Xpress SARS-CoV-2/FLU/RSV  testing.  Fact Sheet for Patients: PinkCheek.be  Fact Sheet for Healthcare Providers: GravelBags.it  This test is not yet approved or cleared by the [place] FDA and  has been authorized for detection and/or diagnosis of SARS-CoV-2 by  FDA under an Emergency Use Authorization (EUA). This EUA will remain  in effect (meaning this test can be used) for the duration of the  Covid-19 declaration under Section 564(b)(1) of the Act, 21  U.S.C. section 360bbb-3(b)(1), unless the authorization is  terminated or revoked. Performed at [facility], [place] [address], [place], [place] [address]   Aerobic/Anaerobic Culture (surgical/deep wound)     Status: None (Preliminary result)   Collection Time: 11/20/19  1:39 PM   Specimen: Soft Tissue, Other  Result Value Ref Range Status   Specimen Description TISSUE  Final   Special Requests LEFT TRANSMETARSAL  Final   Gram Stain   Final    RARE WBC PRESENT,BOTH PMN AND MONONUCLEAR NO ORGANISMS SEEN    Culture   Final    HOLDING FOR POSSIBLE ANAEROBE Performed at [facility], [address] [place], [place] [address]    Report Status PENDING  Incomplete         Radiology Studies: No results found.      Scheduled Meds: . atorvastatin  40 mg Oral Daily  . benazepril  40 mg Oral Daily   . cloNIDine  0.1 mg Transdermal Weekly  . docusate sodium  100 mg Oral BID  . enoxaparin (LOVENOX) injection  40 mg Subcutaneous Q24H  . famotidine  20 mg Oral Daily  . influenza vac split quadrivalent PF  0.5 mL Intramuscular Tomorrow-1000  . insulin pump   Subcutaneous TID WC, HS, 0200  . labetalol  100 mg Oral BID  . latanoprost  1 drop Both Eyes Daily  . metroNIDAZOLE  500 mg Oral Q12H  . nortriptyline  40 mg Oral QHS  . pregabalin  150 mg Oral TID  . senna  1 tablet Oral BID   Continuous Infusions: . sodium chloride    . cefTRIAXone (ROCEPHIN)  IV 2 g (11/22/19 1042)          Aline August, MD [facility] 11/23/2019, 7:55 AM

## 2019-11-23 NOTE — Plan of Care (Signed)

## 2019-11-23 NOTE — Progress Notes (Signed)
Subjective: 3 Days Post-Op Procedure(s) (LRB): TRANSMETATARSAL AMPUTATION LEFT FOOT (Left) Patient seen in rounds for Dr. Doran Durand. Patient reports pain as mild.   No acute events over night, patient does report episode of night sweats, but no temp was recorded.  Able to tolerate PO w/o N/V +void +flatus, -BM +ambulation with walker and PT. Denies CP, SOB, calf pain.  Objective: Vital signs in last 24 hours: Temp:  [98.3 F (36.8 C)-98.7 F (37.1 C)] 98.3 F (36.8 C) (10/10 0721) Pulse Rate:  [90-109] 90 (10/10 0721) Resp:  [17] 17 (10/10 0721) BP: (136-166)/(82-92) 143/87 (10/10 0721) SpO2:  [99 %-100 %] 100 % (10/10 0721)  Intake/Output from previous day: 10/09 0701 - 10/10 0700 In: -  Out: 1350 [Urine:1350] Intake/Output this shift: No intake/output data recorded.  Recent Labs    11/20/19 1233 11/22/19 0343  HGB 13.8 11.8*   Recent Labs    11/20/19 1233 11/22/19 0343  WBC 9.1 8.8  RBC 4.64 3.99*  HCT 42.0 36.4*  PLT 454* 367   Recent Labs    11/20/19 1233 11/22/19 0343  NA 136 136  K 4.3 4.0  CL 101 105  CO2 27 22  BUN 13 12  CREATININE 1.01 1.05  GLUCOSE 148* 110*  CALCIUM 9.0 8.5*   No results for input(s): LABPT, INR in the last 72 hours.  General - Patient is Alert and Oriented Extremity - Neurologically intact Sensation intact above amputation No calf tenderness right LE. Homans negative. Dressing - dressing C/D/I MSK: ROM: Able to perform quad set    Assessment/Plan: 3 Days Post-Op Procedure(s) (LRB): TRANSMETATARSAL AMPUTATION LEFT FOOT (Left)   Active Problems:   Type 2 diabetes mellitus with diabetic neuropathy, with long-term current use of insulin (HCC)   Essential hypertension   BMI 33.0-33.9,adult   OSA (obstructive sleep apnea)   Dyslipidemia   Osteomyelitis of ankle and foot (HCC)  Estimated body mass index is 38.02 kg/m as calculated from the following:   Height as of this encounter: 5\' 10"  (1.778 m).   Weight as  of this encounter: 120.2 kg.  NWB LLE Up with therapy using walker Cultures remain negative IV ceftriaxone and PO metronidazole per ID, plan D/C on Monday on cefdinir and metronidazole, DVT PPx: lovenoxin house, transition to ASA 81 mg BID on D/C. Encourage IS Scripts for pain and DVT ppx have been sent to pharmacy.  Will likely D/C tomorrow.   F/u as out patient in 2 weeks with Dr. Doran Durand.      Yvonne Kendall Laken Lobato 11/23/2019, 8:08 AM

## 2019-11-24 MED ORDER — METRONIDAZOLE 500 MG PO TABS: 500.0000 mg | ORAL_TABLET | Freq: Two times a day (BID) | ORAL | 0 refills | Status: AC

## 2019-11-24 MED ORDER — CEFDINIR 300 MG PO CAPS: 300.0000 mg | ORAL_CAPSULE | Freq: Two times a day (BID) | ORAL | 0 refills | Status: AC

## 2019-11-24 NOTE — Progress Notes (Signed)
Discharge instruction packet provided and reviewed with his wife.  Verbalizes understanding instructions for home.  The patient was given instructions to not remove the dressing until his follow up appointment.  The patient was educated on signs and symptoms of infection.  The patient will be discharged to home with his wife

## 2019-11-24 NOTE — Discharge Summary (Signed)
Physician Discharge Summary  Patient ID: Danny Fuller MRN: [id number] DOB/AGE: 07-23-1975 44 y.o.  Admit date: 11/20/2019 Discharge date: 11/24/2019  Admission Diagnoses: Osteomyelitis L foot, HTN, DM PN, elevated BMI, OSA, dyslipidemia.  Discharge Diagnoses:  Active Problems:   Type 2 diabetes mellitus with diabetic neuropathy, with long-term current use of insulin (HCC)   Essential hypertension   BMI 33.0-33.9,adult   OSA (obstructive sleep apnea)   Dyslipidemia   Osteomyelitis of ankle and foot ([facility]) same as above  Discharged Condition: stable  Hospital Course: Patient presented to [facility] on November 20, 2019 for elective left transmetatarsal amputation by Dr. Wylene Simmer.  The patient tolerated procedure well without any complications.  Intra-Op cultures were obtained.  The patient was admitted to the hospital.  Medicine service and ID were consulted.  Patient was placed on IV ceftriaxone and by mouth metronidazole per ID recommendation.  Intra-Op cultures grew rareprevotella bivia, rare granulaicatella adiacens, and beta lactamase negative for provetella.  Patient worked well with therapy during his stay.  He will be discharged home on November 24, 2019 on oral Cefdinir and metronidazole per ID recommendation.  Consults: Medicine service, ID, and PT  Significant Diagnostic Studies: labs: Intra-op cultures  Treatments: IV hydration, antibiotics: Ancef, ceftriaxone, metronidazole and cefdinir, analgesia: acetaminophen, Morphine and hydrocodone, cardiac meds: labetolol, anticoagulation: lovenox, insulin: Humalog and surgery: as stated above  Discharge Exam: Blood pressure (!) 158/98, pulse 93, temperature 98 F (36.7 C), temperature source Oral, resp. rate 17, height 5\' 10"  (1.778 m), weight 120.2 kg, SpO2 100 %. General: WDWN patient in NAD. Psych:  Appropriate mood and affect. Neuro:  A&O x 3, Moving all extremities, sensation intact to light touch HEENT:  EOMs  intact Chest:  Even non-labored respirations Skin:  Dressing C/D/I, no rashes or lesions Extremities: warm/dry, no visible edema, erythema or echymosis.  No lymphadenopathy. Pulses: Popliteus 2+ MSK:  ROM: TKE, MMT: able to perform quad set   Disposition: Discharge disposition: 01-Home or Self Care       Discharge Instructions    Call MD / Call 911   Complete by: As directed    If you experience chest pain or shortness of breath, CALL 911 and be transported to the hospital emergency room.  If you develope a fever above 101 F, pus (white drainage) or increased drainage or redness at the wound, or calf pain, call your surgeon's office.   Constipation Prevention   Complete by: As directed    Drink plenty of fluids.  Prune juice may be helpful.  You may use a stool softener, such as Colace (over the counter) 100 mg twice a day.  Use MiraLax (over the counter) for constipation as needed.   Diet - low sodium heart healthy   Complete by: As directed    Increase activity slowly as tolerated   Complete by: As directed    Non weight bearing   Complete by: As directed    Laterality: left   Extremity: Lower     Allergies as of 11/24/2019      Reactions   Augmentin [amoxicillin-pot Clavulanate] Itching, Other (See Comments)   Dizziness   Vancomycin Other (See Comments)   AKI      Medication List    STOP taking these medications   traMADol 50 MG tablet Commonly known as: ULTRAM     TAKE these medications   aspirin EC 81 MG tablet Take 1 tablet (81 mg total) by mouth 2 (two) times daily.  benazepril 40 MG tablet Commonly known as: LOTENSIN Take 1 tablet (40 mg total) by mouth daily.   cefdinir 300 MG capsule Commonly known as: OMNICEF Take 1 capsule (300 mg total) by mouth 2 (two) times daily for 10 days.   cloNIDine 0.1 mg/24hr patch Commonly known as: CATAPRES - Dosed in mg/24 hr APPLY 1 PATCH(0.1 MG) EXTERNALLY TO THE SKIN 1 TIME A WEEK What changed:   how much to  take  how to take this  when to take this  additional instructions   docusate sodium 100 MG capsule Commonly known as: Colace Take 1 capsule (100 mg total) by mouth 2 (two) times daily. While taking narcotic pain medicine.   empagliflozin 25 MG Tabs tablet Commonly known as: Jardiance Take 1 tablet (25 mg total) by mouth daily.   Fifty50 Pen Needles 31G X 5 MM Misc Generic drug: Insulin Pen Needle 1 each by Other route daily.   furosemide 20 MG tablet Commonly known as: LASIX Take 1 tablet (20 mg total) by mouth daily. What changed:   when to take this  reasons to take this   glucose blood test strip 1 each by Other route 4 (four) times daily.   glucose blood test strip Use Bayer Contour Next Test strips as instructed to check blood sugar 4 times daily.   HUMULIN R 500 UNIT/ML injection Generic drug: insulin regular human CONCENTRATED Use maximum of 0.6 mL/day in insulin pump   insulin aspart 100 UNIT/ML injection Commonly known as: novoLOG Inject into the skin See admin instructions. Per insulin pump   labetalol 100 MG tablet Commonly known as: NORMODYNE Take 1 tablet (100 mg total) by mouth 2 (two) times daily. TAKE 1 TABLET BY MOUTH TWICE DAILY. What changed: additional instructions   latanoprost 0.005 % ophthalmic solution Commonly known as: XALATAN Place 1 drop into both eyes daily.   Lyrica 150 MG capsule Generic drug: pregabalin Take 1 capsule (150 mg total) by mouth 3 (three) times daily.   methocarbamol 500 MG tablet Commonly known as: Robaxin Take 1 tablet (500 mg total) by mouth every 6 (six) hours as needed for muscle spasms.   metroNIDAZOLE 500 MG tablet Commonly known as: FLAGYL Take 1 tablet (500 mg total) by mouth every 12 (twelve) hours for 10 days.   nortriptyline 10 MG capsule Commonly known as: PAMELOR TAKE 4 CAPSULES BY MOUTH AT BEDTIME What changed:   how much to take  how to take this  when to take this  additional  instructions   OmniPod Dash 5 Pack Pods Misc Inject 1 each into the skin continuous. Apply 1 OmniPod insulin pump to body daily for insulin delivery.   oxyCODONE 5 MG immediate release tablet Commonly known as: Roxicodone Take 1 tablet (5 mg total) by mouth every 4 (four) hours as needed for up to 5 days for moderate pain or severe pain.   pravastatin 40 MG tablet Commonly known as: PRAVACHOL Take 1 tablet (40 mg total) by mouth every evening. What changed: when to take this   senna 8.6 MG Tabs tablet Commonly known as: SENOKOT Take 2 tablets (17.2 mg total) by mouth 2 (two) times daily.   Victoza 18 MG/3ML Sopn Generic drug: liraglutide Inject 1.2 mg into the skin daily. Inject once daily at the same time            [place]  (From admission, onward)         Start     Ordered  11/21/19 1206  For home use only DME 3 n 1  Once        11/21/19 1206   11/21/19 1206  For home use only DME Walker rolling  Once       Question Answer Comment  Walker: With [place]   Patient needs a walker to treat with the following condition Weakness      11/21/19 1206           Discharge Care Instructions  (From admission, onward)         Start     Ordered   11/24/19 0000  Non weight bearing       Question Answer Comment  Laterality left   Extremity Lower      11/24/19 0807          Follow-up Information    Wylene Simmer, MD. Schedule an appointment as soon as possible for a visit in 2 week(s).   Specialty: Orthopedic Surgery Contact information: [address] [place] [address] [phone]               Signed: Mohammed Kindle Office:  [phone]

## 2019-11-24 NOTE — Plan of Care (Signed)

## 2019-11-24 NOTE — Plan of Care (Signed)
  Problem: Education: Goal: Knowledge of General Education information will improve Description: Including pain rating scale, medication(s)/side effects and non-pharmacologic comfort measures Outcome: Adequate for Discharge   Problem: Health Behavior/Discharge Planning: Goal: Ability to manage health-related needs will improve Outcome: Adequate for Discharge   Problem: Clinical Measurements: Goal: Ability to maintain clinical measurements within normal limits will improve Outcome: Adequate for Discharge   Problem: Activity: Goal: Risk for activity intolerance will decrease Outcome: Adequate for Discharge   Problem: Nutrition: Goal: Adequate nutrition will be maintained Outcome: Adequate for Discharge   Problem: Coping: Goal: Level of anxiety will decrease Outcome: Adequate for Discharge   Problem: Elimination: Goal: Will not experience complications related to bowel motility Outcome: Adequate for Discharge Goal: Will not experience complications related to urinary retention Outcome: Adequate for Discharge   Problem: Pain Managment: Goal: General experience of comfort will improve Outcome: Adequate for Discharge   Problem: Safety: Goal: Ability to remain free from injury will improve Outcome: Adequate for Discharge   Problem: Skin Integrity: Goal: Risk for impaired skin integrity will decrease Outcome: Adequate for Discharge

## 2019-11-24 NOTE — Care Management Important Message (Signed)
Important Message  Patient Details  Name: KYLIE GROS MRN: [id number] Date of Birth: 11-08-1975   Medicare Important Message Given:  Yes - Important Message mailed due to current National Emergency  Verbal consent obtained due to current National Emergency  Relationship to patient: Self Contact Name: Rolondo Pierre Call Date: 11/24/19  Time: 1217 Phone: [phone] Outcome: No Answer/Busy Important Message mailed to: Patient address on file    Delorse Lek 11/24/2019, 12:17 PM

## 2019-11-24 NOTE — Progress Notes (Signed)
PT Cancellation Note  Patient Details Name: Danny Fuller MRN: [id number] DOB: [birth year]   Cancelled Treatment:    Reason Eval/Treat Not Completed: Other (comment) (Patient declined) Patient reports recent pain medication making him drowsy and also generally tired. Discussed stair negotiation for when he returns home. Patient verbalized understanding for safety and one person assist. Patient to discharge today.  Perrin Maltese, PT, DPT Acute Rehabilitation Services Pager [phone] Office [phone]   Alda Lea 11/24/2019, 10:23 AM

## 2019-11-24 NOTE — Progress Notes (Signed)
Patient ID: Danny Fuller, male   DOB: Jan 09, 1976, 44 y.o.   MRN: [id number]  PROGRESS NOTE    Danny Fuller  ZRA:[id number] DOB: [birth year] DOA: 11/20/2019 PCP: [name], MD   Brief Narrative:  44 year old male with history of diabetes mellitus type 2, neuropathy and left foot ulcer, CKD stage IIIa, OSA not on CPAP, morbid obesity, hypertension and hyperlipidemia was admitted under orthopedic surgery for infected right foot ulcer after he failed outpatient treatment for IV antibiotics.  He underwent left transmetatarsal amputation by Dr. Doran Durand.  Subsequently ID and hospitalist service were consulted.  Hospitalist was consulted to help manage uncontrolled diabetes, hypertension and other comorbidities.  Assessment & Plan:   Diabetes mellitus type 2 uncontrolled with hyperglycemia/neuropathy/diabetic foot ulcer -A1c 11.7.  Blood sugars currently stable.  Continue insulin pump.  Outpatient follow-up with endocrinology  Left foot osteomyelitis/infected diabetic foot ulcer with failed outpatient antibiotic treatment -Status post left foot transmetatarsal amputation by Dr. Doran Durand.  Currently on IV antibiotics as per ID recommendations with plan for oral antibiotics on discharge. -Care as per primary orthopedics team  Hypertension -Blood pressure on the higher side but improving.  Continue benazepril, clonidine, labetalol  Hyperlipidemia -Continue statin  OSA not on CPAP --Patient reported not using CPAP at home, reports he is a scheduled for another sleep study as an outpatient.  Morbid obesity -Outpatient follow-up  Patient is currently medically stable for discharge from hospitalist point of view.   Subjective: Patient seen and examined at bedside.  Has intermittent left foot pain.  No overnight fever, vomiting or worsening shortness of breath reported.   Objective: Vitals:   11/23/19 0721 11/23/19 1403 11/23/19 1942 11/24/19 0500  BP: (!) 143/87 (!) 142/86 (!) 161/100  (!) 167/98  Pulse: 90 92 (!) 103 96  Resp: 17 18 16 17   Temp: 98.3 F (36.8 C) 98.2 F (36.8 C) 98.3 F (36.8 C) 98 F (36.7 C)  TempSrc: Oral Oral Oral Oral  SpO2: 100% 100% 100% 100%  Weight:      Height:        Intake/Output Summary (Last 24 hours) at 11/24/2019 0728 Last data filed at 11/24/2019 0500 Gross per 24 hour  Intake 680 ml  Output 500 ml  Net 180 ml   Filed Weights   11/20/19 1052  Weight: 120.2 kg    Examination:  General exam: No distress.   Respiratory system: Bilateral decreased breath sounds at bases with some scattered crackles  cardiovascular system: Currently rate controlled, S1-S2 heard  gastrointestinal system: Abdomen is morbidly obese, slightly distended, soft and nontender.  Normal bowel sounds heard Extremities: Mild lower extremity edema present.  No clubbing.  Left foot dressing present   data Reviewed: I have personally reviewed following labs and imaging studies  CBC: Recent Labs  Lab 11/20/19 1233 11/22/19 0343  WBC 9.1 8.8  HGB 13.8 11.8*  HCT 42.0 36.4*  MCV 90.5 91.2  PLT 454* 545   Basic Metabolic Panel: Recent Labs  Lab 11/20/19 1233 11/22/19 0343  NA 136 136  K 4.3 4.0  CL 101 105  CO2 27 22  GLUCOSE 148* 110*  BUN 13 12  CREATININE 1.01 1.05  CALCIUM 9.0 8.5*   GFR: Estimated Creatinine Clearance: 119.1 mL/min (by C-G formula based on SCr of 1.05 mg/dL). Liver Function Tests: No results for input(s): AST, ALT, ALKPHOS, BILITOT, PROT, ALBUMIN in the last 168 hours. No results for input(s): LIPASE, AMYLASE in the last 168 hours. No results for  input(s): AMMONIA in the last 168 hours. Coagulation Profile: No results for input(s): INR, PROTIME in the last 168 hours. Cardiac Enzymes: No results for input(s): CKTOTAL, CKMB, CKMBINDEX, TROPONINI in the last 168 hours. BNP (last 3 results) No results for input(s): PROBNP in the last 8760 hours. HbA1C: No results for input(s): HGBA1C in the last 72  hours. CBG: Recent Labs  Lab 11/20/19 1256 11/20/19 1430 11/20/19 1717 11/20/19 2019 11/21/19 0158  GLUCAP 131* 162* 153* 172* 191*   Lipid Profile: No results for input(s): CHOL, HDL, LDLCALC, TRIG, CHOLHDL, LDLDIRECT in the last 72 hours. Thyroid Function Tests: No results for input(s): TSH, T4TOTAL, FREET4, T3FREE, THYROIDAB in the last 72 hours. Anemia Panel: No results for input(s): VITAMINB12, FOLATE, FERRITIN, TIBC, IRON, RETICCTPCT in the last 72 hours. Sepsis Labs: No results for input(s): PROCALCITON, LATICACIDVEN in the last 168 hours.  Recent Results (from the past 240 hour(s))  Respiratory Panel by RT PCR (Flu A&B, Covid) - Nasopharyngeal Swab     Status: None   Collection Time: 11/20/19 10:51 AM   Specimen: Nasopharyngeal Swab  Result Value Ref Range Status   SARS Coronavirus 2 by RT PCR NEGATIVE NEGATIVE Final    Comment: (NOTE) SARS-CoV-2 target nucleic acids are NOT DETECTED.  The SARS-CoV-2 RNA is generally detectable in upper respiratoy specimens during the acute phase of infection. The lowest concentration of SARS-CoV-2 viral copies this assay can detect is 131 copies/mL. A negative result does not preclude SARS-Cov-2 infection and should not be used as the sole basis for treatment or other patient management decisions. A negative result may occur with  improper specimen collection/handling, submission of specimen other than nasopharyngeal swab, presence of viral mutation(s) within the areas targeted by this assay, and inadequate number of viral copies (<131 copies/mL). A negative result must be combined with clinical observations, patient history, and epidemiological information. The expected result is Negative.  Fact Sheet for Patients:  PinkCheek.be  Fact Sheet for Healthcare Providers:  GravelBags.it  This test is no t yet approved or cleared by the [place] FDA and  has been  authorized for detection and/or diagnosis of SARS-CoV-2 by FDA under an Emergency Use Authorization (EUA). This EUA will remain  in effect (meaning this test can be used) for the duration of the COVID-19 declaration under Section 564(b)(1) of the Act, 21 U.S.C. section 360bbb-3(b)(1), unless the authorization is terminated or revoked sooner.     Influenza A by PCR NEGATIVE NEGATIVE Final   Influenza B by PCR NEGATIVE NEGATIVE Final    Comment: (NOTE) The Xpert Xpress SARS-CoV-2/FLU/RSV assay is intended as an aid in  the diagnosis of influenza from Nasopharyngeal swab specimens and  should not be used as a sole basis for treatment. Nasal washings and  aspirates are unacceptable for Xpert Xpress SARS-CoV-2/FLU/RSV  testing.  Fact Sheet for Patients: PinkCheek.be  Fact Sheet for Healthcare Providers: GravelBags.it  This test is not yet approved or cleared by the [place] FDA and  has been authorized for detection and/or diagnosis of SARS-CoV-2 by  FDA under an Emergency Use Authorization (EUA). This EUA will remain  in effect (meaning this test can be used) for the duration of the  Covid-19 declaration under Section 564(b)(1) of the Act, 21  U.S.C. section 360bbb-3(b)(1), unless the authorization is  terminated or revoked. Performed at [facility], [place] [address] [place], [place] [address]   Aerobic/Anaerobic Culture (surgical/deep wound)     Status: None   Collection Time:  11/20/19  1:39 PM   Specimen: Soft Tissue, Other  Result Value Ref Range Status   Specimen Description WOUND  Final   Special Requests LEFT TRANSMETARSAL  Final   Gram Stain   Final    RARE WBC PRESENT,BOTH PMN AND MONONUCLEAR NO ORGANISMS SEEN    Culture   Final    RARE PREVOTELLA BIVIA RARE GRANULICATELLA ADIACENS BETA LACTAMASE NEGATIVE FOR PREVOTELLA Performed at [facility], [place] [address], [place], [place] [address]     Report Status 11/23/2019 FINAL  Final         Radiology Studies: No results found.      Scheduled Meds: . atorvastatin  40 mg Oral Daily  . benazepril  40 mg Oral Daily  . cloNIDine  0.1 mg Transdermal Weekly  . docusate sodium  100 mg Oral BID  . enoxaparin (LOVENOX) injection  40 mg Subcutaneous Q24H  . famotidine  20 mg Oral Daily  . insulin pump   Subcutaneous TID WC, HS, 0200  . labetalol  100 mg Oral BID  . latanoprost  1 drop Both Eyes Daily  . metroNIDAZOLE  500 mg Oral Q12H  . nortriptyline  40 mg Oral QHS  . pregabalin  150 mg Oral TID  . senna  1 tablet Oral BID   Continuous Infusions: . sodium chloride    . cefTRIAXone (ROCEPHIN)  IV Stopped (11/23/19 1159)          Aline August, MD [facility] 11/24/2019, 7:28 AM

## 2019-11-24 NOTE — Progress Notes (Signed)
Subjective: 4 Days Post-Op Procedure(s) (LRB): TRANSMETATARSAL AMPUTATION LEFT FOOT (Left)  Patient reports pain as mild to moderate.  Tolerating POs well.  Admits to flatus.  Denies fever, chills, N/V, CP, SOB.  Reports that he is ready to go home.  Objective:   VITALS:  Temp:  [98 F (36.7 C)-98.3 F (36.8 C)] 98 F (36.7 C) (10/11 0500) Pulse Rate:  [92-103] 96 (10/11 0500) Resp:  [16-18] 17 (10/11 0500) BP: (142-167)/(86-100) 167/98 (10/11 0500) SpO2:  [100 %] 100 % (10/11 0500)  General: WDWN patient in NAD. Psych:  Appropriate mood and affect. Neuro:  A&O x 3, Moving all extremities, sensation intact to light touch HEENT:  EOMs intact Chest:  Even non-labored respirations Skin:  Dressing C/D/I, no rashes or lesions Extremities: warm/dry, no visible edema, erythema or echymosis.  No lymphadenopathy. Pulses: Popliteus 2+ MSK:  ROM: TKE, MMT: able to perform quad set    LABS Recent Labs    11/22/19 0343  HGB 11.8*  WBC 8.8  PLT 367   Recent Labs    11/22/19 0343  NA 136  K 4.0  CL 105  CO2 22  BUN 12  CREATININE 1.05  GLUCOSE 110*   No results for input(s): LABPT, INR in the last 72 hours.   Assessment/Plan: 4 Days Post-Op Procedure(s) (LRB): TRANSMETATARSAL AMPUTATION LEFT FOOT (Left)  NWB L LE Cultures show rare prevotella bivia, rare granulicatella adiacens, and beta lactamase negative for prevotella. ID recommends D/C on po cefdinir and metronidazole through 10/21. D/C scripts sent to patient's pharmacy D/C home today.  [name] PA-C [facility] Office:  [phone]

## 2019-11-27 ENCOUNTER — Other Ambulatory Visit: Payer: Self-pay | Admitting: *Deleted

## 2019-11-27 NOTE — Patient Outreach (Signed)
[facility]) Care Management  11/27/2019  Danny Fuller 05-12-1975 [id number]   RED ON EMMI ALERT Day # 1 Date: 10/13 Red Alert Reason: Questions about discharge papers and Don't know who to contact regarding changes in condition   Outreach attempt #1, unsuccessful, HIPAA compliant voice message left.     Plan: RN CM will send unsuccessful outreach letter and follow up within the next 3-4 business days.  Valente David, [place], MSN [place] [phone]

## 2019-11-28 ENCOUNTER — Encounter: Payer: Self-pay | Admitting: Neurology

## 2019-11-28 ENCOUNTER — Other Ambulatory Visit: Payer: Self-pay

## 2019-11-28 ENCOUNTER — Ambulatory Visit (INDEPENDENT_AMBULATORY_CARE_PROVIDER_SITE_OTHER): Payer: Medicare Other | Admitting: Neurology

## 2019-11-28 VITALS — BP 143/94 | HR 101 | Ht 71.0 in | Wt 265.0 lb

## 2019-11-28 DIAGNOSIS — E1142 Type 2 diabetes mellitus with diabetic polyneuropathy: Secondary | ICD-10-CM

## 2019-11-28 NOTE — Progress Notes (Signed)
Follow-up Visit   Date: 11/28/19    Danny Fuller MRN: [id number] DOB: [birth year]   Interim History: Danny Fuller is a 44 y.o. left-handed African American male with diabetes mellitus, GERD, and hypertension returning to the clinic for follow-up of diabetic neuropathy.  The patient was accompanied to the clinic by son, Danny Fuller.  His neuropathy has been relatively stable with diffuse numbness in the lower legs/feet and hands.  His tingling and burning pain is well-controlled on Lyrica 150mg  TID and nortriptyline 40mg  at bedtime. 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  He is very sedentary because of this and trying hard to keep his spirits up.     Medications:  Current Outpatient Medications on File Prior to Visit  Medication Sig Dispense Refill  . aspirin EC 81 MG tablet Take 1 tablet (81 mg total) by mouth 2 (two) times daily. 84 tablet 0  . benazepril (LOTENSIN) 40 MG tablet Take 1 tablet (40 mg total) by mouth daily. 90 tablet 3  . cefdinir (OMNICEF) 300 MG capsule Take 1 capsule (300 mg total) by mouth 2 (two) times daily for 10 days. 20 capsule 0  . cloNIDine (CATAPRES - DOSED IN MG/24 HR) 0.1 mg/24hr patch APPLY 1 PATCH(0.1 MG) EXTERNALLY TO THE SKIN 1 TIME A WEEK (Patient taking differently: Place 0.1 mg onto the skin once a week. Sundays) 4 patch 3  . docusate sodium (COLACE) 100 MG capsule Take 1 capsule (100 mg total) by mouth 2 (two) times daily. While taking narcotic pain medicine. 30 capsule 0  . empagliflozin (JARDIANCE) 25 MG TABS tablet Take 1 tablet (25 mg total) by mouth daily. 90 tablet 3  . furosemide (LASIX) 20 MG tablet Take 1 tablet (20 mg total) by mouth daily. (Patient taking differently: Take 20 mg by mouth as needed for fluid (swelling). ) 90 tablet 3  . glucose blood test strip Use Bayer Contour Next Test strips as instructed to check blood sugar 4 times daily. 150 each 3  . glucose blood test strip 1 each by Other  route 4 (four) times daily.     . insulin aspart (NOVOLOG) 100 UNIT/ML injection Inject into the skin See admin instructions. Per insulin pump    . Insulin Disposable Pump (OMNIPOD DASH 5 PACK PODS) MISC Inject 1 each into the skin continuous. Apply 1 OmniPod insulin pump to body daily for insulin delivery. 30 each 12  . Insulin Pen Needle (FIFTY50 PEN NEEDLES) 31G X 5 MM MISC 1 each by Other route daily.     . insulin regular human CONCENTRATED (HUMULIN R) 500 UNIT/ML injection Use maximum of 0.6 mL/day in insulin pump 20 mL 1  . labetalol (NORMODYNE) 100 MG tablet Take 1 tablet (100 mg total) by mouth 2 (two) times daily. TAKE 1 TABLET BY MOUTH TWICE DAILY. (Patient taking differently: Take 100 mg by mouth 2 (two) times daily. ) 180 tablet 3  . latanoprost (XALATAN) 0.005 % ophthalmic solution Place 1 drop into both eyes daily.    Marland Kitchen LYRICA 150 MG capsule Take 1 capsule (150 mg total) by mouth 3 (three) times daily. 90 capsule 5  . methocarbamol (ROBAXIN) 500 MG tablet Take 1 tablet (500 mg total) by mouth every 6 (six) hours as needed for muscle spasms. 60 tablet 5  . metroNIDAZOLE (FLAGYL) 500 MG tablet Take 1 tablet (500 mg total) by mouth every 12 (twelve) hours for 10 days. 20 tablet 0  .  nortriptyline (PAMELOR) 10 MG capsule TAKE 4 CAPSULES BY MOUTH AT BEDTIME (Patient taking differently: Take 40 mg by mouth at bedtime. ) 360 capsule 3  . pravastatin (PRAVACHOL) 40 MG tablet Take 1 tablet (40 mg total) by mouth every evening. (Patient taking differently: Take 40 mg by mouth daily. ) 90 tablet 3  . senna (SENOKOT) 8.6 MG TABS tablet Take 2 tablets (17.2 mg total) by mouth 2 (two) times daily. 30 tablet 0  . VICTOZA 18 MG/3ML SOPN Inject 1.2 mg into the skin daily. Inject once daily at the same time (Patient not taking: Reported on 11/28/2019) 9 mL 0   No current facility-administered medications on file prior to visit.    Allergies:  Allergies  Allergen Reactions  . Augmentin  [Amoxicillin-Pot Clavulanate] Itching and Other (See Comments)    Dizziness  . Vancomycin Other (See Comments)    AKI    Vital Signs:  BP (!) 143/94   Pulse (!) 101   Ht 5\' 11"  (1.803 m)   Wt 265 lb (120.2 kg)   SpO2 100%   BMI 36.96 kg/m   Neurological Exam: MENTAL STATUS including orientation to time, place, person, recent and remote memory, attention span and concentration, language, and fund of knowledge is normal.  Speech is not dysarthric.  CRANIAL NERVES:  Normal conjugate, extra-ocular eye movements in all directions of gaze.  No ptosis.  MOTOR:  Motor strength is 5/5 in all extremities, except distally finger abductors 4/5, right toe extensors and flexors 4/5. s/p left mid-foot amputation - left foot and lower leg is in a cast  MSRs:  Reflexes are 2+/4 in the upper extremities, 1+ at the left patella, and absent R patella and ankles  SENSORY:  Vibration is reduced, but present at the right ankle, absent at the great toe  COORDINATION/GAIT:  Gait not tested  Data: NCS/EMG of the right side 12/14/2016:   1. 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  Overall, these findings are severe in degree electrically. 2. 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  NCS/EMG of the upper extremities 07/31/2017:  The electrophysiologic findings are most suggestive of a sensorimotor polyradiculoneuropathy affecting the upper extremities, moderate in degree electrically, which is new when compared to his previous study dated 12/31/2016. Alternatively, progression of sensorimotor demyelinating and axonal polyneuropathy with a superimposed ulnar neuropathy across the elbow (worse on the left), cannot be excluded. Correlate clinically.  MRI lumbar spine wo contrast 08/22/2017: 1. Mild multifactorial spinal stenosis at L4-5 with a  broad-based disc protrusion in the left subarticular zone and left foramen contributing to possible left L5 nerve root encroachment in the lateral recess. The left foramen also appears mildly narrowed. 2. Mild left foraminal narrowing at L5-S1 without definite nerve root encroachment. 3. No other significant acquired disc space findings. Congenitally short pedicles.  Lab Results  Component Value Date   HGBA1C 11.7 (H) 11/21/2019   Lab Results  Component Value Date   CREATININE 1.05 11/22/2019   BUN 12 11/22/2019   NA 136 11/22/2019   K 4.0 11/22/2019   CL 105 11/22/2019   CO2 22 11/22/2019    IMPRESSION/PLAN: 1.  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  Continue Lyrics 150mg  TID 2.  Continue nortriptyline 40mg  at bedtime 3.  Try to offer patient techniques to  help support this new change  4.  Patient educated on daily foot inspection, fall prevention, and safety precautions around the home.  Return to clinic in 6 months   Thank you for allowing me to participate in patient's care.  If I can answer any additional questions, I would be pleased to do so.    Sincerely,    Reiley Keisler K. Posey Pronto, DO

## 2019-11-28 NOTE — Patient Instructions (Signed)
Continue your medications as you are taking  Start doing things that you enjoy like listening to music, puzzles, etc.  Return to clinic in 6 months

## 2019-12-02 ENCOUNTER — Other Ambulatory Visit: Payer: Self-pay | Admitting: *Deleted

## 2019-12-02 NOTE — Patient Outreach (Signed)
[facility]) Care Management  12/02/2019  Danny Fuller March 16, 1975 [id number]   RED ON EMMI ALERT Day # 1 Date: 10/13 Red Alert Reason: Questions about discharge papers and Don't know who to contact regarding changes in condition   Outreach attempt #2, successful.  Identity verified.  This care manager introduced self and stated purpose of call.  [facility] care management services explained.    Social: Member lives with wife, report he is independent in ADL's.  Denies need for PT, non-weight bearing on his surgical foot.  Has been having some pain but report it is better now.  Has a soft cast on foot, will have it removed during MD follow up.  Conditions: Per chart, has history of HTN, PVD, pulmonary HTN, DM ( A1C 11.7), GERD, CKD, and HLD.  Medications: Reviewed with member, report he is compliant with regime.  He expresses frustration regarding using his insulin pump, state there has been several changes over the last few years and his A1C is still elevated.  He had asymptomatic low today at 69, report it is now back up to 75.  Report usually in the 100s.  Also monitors blood pressure daily, today was 131/78.  Appointments: Had appointment with neurology on 10/15 for neuropathy.  Will follow up with ID on 11/6.  Has not scheduled appointment with PCP yet, advised to do so.  He is scheduled for surgical follow up on 10/20.  Noted that he was last seen by endocrinology on 9/20 and was advised to follow up in 4 weeks, no appointment scheduled.  Encouraged to call to schedule follow up.    Plan: RN CM will follow up within the next 2 weeks to complete initial assessment.  Will send EMMI diabetes education.  Goals Addressed            This Visit's Progress   . [facility] - Monitor and Manage My Blood Sugar       Follow Up Date 11/19   - check blood sugar at prescribed times - check blood sugar before and after exercise - check blood sugar if I feel it is too high or too  low - enter blood sugar readings and medication or insulin into daily log    Why is this important?   Checking your blood sugar at home helps to keep it from getting very high or very low.  Writing the results in a diary or log helps the doctor know how to care for you.  Your blood sugar log should have the time, date and the results.  Also, write down the amount of insulin or other medicine that you take.  Other information, like what you ate, exercise done and how you were feeling, will also be helpful.     Notes:     . [facility] - Set My Target A1C       Follow Up Date 02/28/2020   - set target A1C (less than 8)    Why is this important?   Your target A1C is decided together by you and your doctor.  It is based on several things like your age and other health issues.    Notes:       Valente David, RN, MSN [place] [phone]

## 2019-12-04 ENCOUNTER — Encounter: Payer: Self-pay | Admitting: *Deleted

## 2019-12-12 ENCOUNTER — Telehealth: Payer: Self-pay | Admitting: *Deleted

## 2019-12-12 NOTE — Telephone Encounter (Signed)
I called the patient and left message on his cell asking him to call the sleep lab at [phone] if he wished to reschedule his sleep study.

## 2019-12-12 NOTE — Telephone Encounter (Signed)
-----   Message from Roland Earl sent at 12/12/2019  7:26 AM EDT ----- Regarding: Split Night Sleep Study Patient cancelled 09/03/19 study---do you call and try to reschedule?

## 2019-12-15 ENCOUNTER — Other Ambulatory Visit: Payer: Self-pay | Admitting: *Deleted

## 2019-12-15 ENCOUNTER — Ambulatory Visit (INDEPENDENT_AMBULATORY_CARE_PROVIDER_SITE_OTHER): Payer: Medicare Other | Admitting: Family Medicine

## 2019-12-15 ENCOUNTER — Encounter: Payer: Self-pay | Admitting: Family Medicine

## 2019-12-15 ENCOUNTER — Other Ambulatory Visit: Payer: Self-pay

## 2019-12-15 VITALS — BP 180/110 | HR 92 | Temp 98.2°F | Ht 70.0 in | Wt 273.1 lb

## 2019-12-15 DIAGNOSIS — Z794 Long term (current) use of insulin: Secondary | ICD-10-CM

## 2019-12-15 DIAGNOSIS — I10 Essential (primary) hypertension: Secondary | ICD-10-CM | POA: Diagnosis not present

## 2019-12-15 DIAGNOSIS — E1140 Type 2 diabetes mellitus with diabetic neuropathy, unspecified: Secondary | ICD-10-CM

## 2019-12-15 MED ORDER — AMLODIPINE BESYLATE 10 MG PO TABS: 10.0000 mg | ORAL_TABLET | Freq: Every day | ORAL | 5 refills | Status: DC

## 2019-12-15 MED ORDER — INSULIN ASPART 100 UNIT/ML ~~LOC~~ SOLN
SUBCUTANEOUS | 0 refills | Status: DC
Start: 2019-12-15 — End: 2019-12-23

## 2019-12-15 NOTE — Progress Notes (Signed)
   Subjective:    Patient ID: Danny Fuller, male    DOB: [birth year], 44 y.o.   MRN: [id number]  HPI Here to follow up on diabetes and HTN. His BP has been running high at home for several weeks. He feels well in general. He also asks for a referral to a new Endocrinologist. He feels that a fresh perspective would be helpful.    Review of Systems  Constitutional: Negative.   Respiratory: Negative.   Cardiovascular: Negative.        Objective:   Physical Exam Constitutional:      Appearance: Normal appearance.  Cardiovascular:     Rate and Rhythm: Normal rate and regular rhythm.     Pulses: Normal pulses.     Heart sounds: Normal heart sounds.  Pulmonary:     Effort: Pulmonary effort is normal.     Breath sounds: Normal breath sounds.  Neurological:     Mental Status: He is alert.           Assessment & Plan:  For the HTN, we will add Amlodipine 10 mg daily. For the diabetes, we willl refer him to [facility].  [name], MD

## 2019-12-15 NOTE — Patient Outreach (Signed)
Danny Fuller) Care Management  12/15/2019  Danny Fuller July 16, 1975 [id number]   Call placed to member to follow up on management of diabetes and surgery recovery.  State he is feeling much better, report he had his soft cast removed, will return on 11/3 to have stitches removed.  Report blood sugars have been better, ranging 140-150's with current treatment.  He was seen by his PCP today, blood pressure was elevated, antihypertensives were changed.  Member also requested to have new endocrinologist assigned, referral was placed to [facility], awaiting call for appointment.  Visit scheduled with ID on 11/16.  Denies any urgent concerns, report he is still doing his best to follow diabetic/low sodium diet.  Encouraged to contact this care manger with questions, agrees to follow up within the next month.  Goals Addressed            This Visit's Progress   . [facility] - Monitor and Manage My Blood Sugar   On track    Follow Up Date 11/19   - check blood sugar at prescribed times - check blood sugar before and after exercise - check blood sugar if I feel it is too high or too low - enter blood sugar readings and medication or insulin into daily log    Why is this important?   Checking your blood sugar at home helps to keep it from getting very high or very low.  Writing the results in a diary or log helps the doctor know how to care for you.  Your blood sugar log should have the time, date and the results.  Also, write down the amount of insulin or other medicine that you take.  Other information, like what you ate, exercise done and how you were feeling, will also be helpful.     Notes:     . [facility] - Set My Target A1C   On track    Follow Up Date 02/28/2020   - set target A1C (less than 8)    Why is this important?   Your target A1C is decided together by you and your doctor.  It is based on several things like your age and other health issues.    Notes:        Valente David, RN, MSN [place] [phone]

## 2019-12-20 ENCOUNTER — Encounter: Payer: Self-pay | Admitting: Family Medicine

## 2019-12-22 ENCOUNTER — Other Ambulatory Visit: Payer: Self-pay | Admitting: *Deleted

## 2019-12-23 ENCOUNTER — Other Ambulatory Visit: Payer: Self-pay | Admitting: *Deleted

## 2019-12-23 DIAGNOSIS — E1142 Type 2 diabetes mellitus with diabetic polyneuropathy: Secondary | ICD-10-CM | POA: Diagnosis not present

## 2019-12-23 DIAGNOSIS — Z9641 Presence of insulin pump (external) (internal): Secondary | ICD-10-CM | POA: Diagnosis not present

## 2019-12-23 DIAGNOSIS — Z89422 Acquired absence of other left toe(s): Secondary | ICD-10-CM | POA: Diagnosis not present

## 2019-12-23 DIAGNOSIS — I10 Essential (primary) hypertension: Secondary | ICD-10-CM | POA: Diagnosis not present

## 2019-12-23 DIAGNOSIS — E1165 Type 2 diabetes mellitus with hyperglycemia: Secondary | ICD-10-CM | POA: Diagnosis not present

## 2019-12-23 MED ORDER — INSULIN ASPART 100 UNIT/ML ~~LOC~~ SOLN: SUBCUTANEOUS | 2 refills | Status: DC

## 2019-12-30 ENCOUNTER — Other Ambulatory Visit: Payer: Self-pay

## 2019-12-30 ENCOUNTER — Encounter: Payer: Self-pay | Admitting: Internal Medicine

## 2019-12-30 ENCOUNTER — Ambulatory Visit (INDEPENDENT_AMBULATORY_CARE_PROVIDER_SITE_OTHER): Payer: Medicare Other | Admitting: Internal Medicine

## 2019-12-30 VITALS — BP 191/121 | HR 99 | Temp 97.6°F | Wt 270.0 lb

## 2019-12-30 DIAGNOSIS — E11628 Type 2 diabetes mellitus with other skin complications: Secondary | ICD-10-CM

## 2019-12-30 DIAGNOSIS — L089 Local infection of the skin and subcutaneous tissue, unspecified: Secondary | ICD-10-CM

## 2019-12-30 NOTE — Progress Notes (Signed)
[facility] for [facility]ious [facility]      Patient Active Problem List   Diagnosis Date Noted  . Osteomyelitis of ankle and foot ([place]) 11/20/2019  . Ulcer of left foot due to type 2 diabetes mellitus ([place]) 10/24/2019  . Educated about COVID-19 virus infection 06/23/2019  . Diabetic peripheral neuropathy ([place]) 12/16/2018  . Pain due to onychomycosis of toenails of both feet 08/07/2018  . Pre-ulcerative calluses 08/07/2018  . Acquired absence of left foot ([place]) 08/07/2018  . Hypotension 04/04/2018  . Leukocytosis 04/04/2018  . Dyslipidemia 04/01/2018  . Pulmonary hypertension ([place]) 04/01/2018  . Chronic low back pain with bilateral sciatica 07/24/2017  . Hyperlipidemia 03/02/2017  . Chest pain 03/02/2017  . Acute renal failure superimposed on stage 2 chronic kidney disease ([place]) 02/07/2017  . OSA (obstructive sleep apnea) 01/29/2017  . Insomnia 01/29/2017  . PVD (peripheral vascular disease) ([place]) 11/28/2016  . Palpitation 11/28/2016  . Erectile dysfunction associated with type 2 diabetes mellitus ([place]) 05/08/2016  . Erectile dysfunction due to type 2 diabetes mellitus ([place]) 05/08/2016  . Poorly controlled diabetes mellitus ([place]) 03/30/2016  . Necrotizing fasciitis ([place]) 03/07/2016  . Pyelonephritis 03/04/2016  . Acquired contracture of Achilles tendon, left 02/15/2016  . Arthralgia of multiple joints 01/19/2016  . Diabetic polyneuropathy associated with type 2 diabetes mellitus ([place]) 01/18/2016  . Acquired absence of other left toe(s) ([place]) 01/18/2016  . Foot amputation status, left 01/04/2016  . Peripheral neuropathy 11/11/2015  . AKI (acute kidney injury) ([place])   . Asthma 07/23/2015  . Chronic headache 07/23/2015  . Erectile disorder due to medical condition in male 04/23/2015  . BMI 33.0-33.9,adult 04/21/2012  . GERD 04/21/2008  . Type 2 diabetes mellitus with diabetic neuropathy, with long-term current use of insulin ([place]) 11/21/2006  . Essential  hypertension 11/21/2006  . Lumbar sprain 11/21/2006    Patient's Medications  New Prescriptions   No medications on file  Previous Medications   AMLODIPINE (NORVASC) 10 MG TABLET    Take 1 tablet (10 mg total) by mouth daily.   BENAZEPRIL (LOTENSIN) 40 MG TABLET    Take 1 tablet (40 mg total) by mouth daily.   CLONIDINE (CATAPRES - DOSED IN MG/24 HR) 0.1 MG/24HR PATCH    APPLY 1 PATCH(0.1 MG) EXTERNALLY TO THE SKIN 1 TIME A WEEK   EMPAGLIFLOZIN (JARDIANCE) 25 MG TABS TABLET    Take 1 tablet (25 mg total) by mouth daily.   FUROSEMIDE (LASIX) 20 MG TABLET    Take 1 tablet (20 mg total) by mouth daily.   GLUCOSE BLOOD TEST STRIP    Use Bayer Contour Next Test strips as instructed to check blood sugar 4 times daily.   GLUCOSE BLOOD TEST STRIP    1 each by Other route 4 (four) times daily.    INSULIN ASPART (NOVOLOG) 100 UNIT/ML INJECTION    Use maximum of 0.6 mL/day in insulin pump   INSULIN DISPOSABLE PUMP (OMNIPOD DASH 5 PACK PODS) MISC    Inject 1 each into the skin continuous. Apply 1 OmniPod insulin pump to body daily for insulin delivery.   INSULIN PEN NEEDLE (FIFTY50 PEN NEEDLES) 31G X 5 MM MISC    1 each by Other route daily.    INSULIN REGULAR HUMAN CONCENTRATED (HUMULIN R) 500 UNIT/ML INJECTION    Use maximum of 0.6 mL/day in insulin pump   LABETALOL (NORMODYNE) 100 MG TABLET    Take 1 tablet (100 mg total) by mouth 2 (two)  times daily. TAKE 1 TABLET BY MOUTH TWICE DAILY.   LATANOPROST (XALATAN) 0.005 % OPHTHALMIC SOLUTION    Place 1 drop into both eyes daily.   LYRICA 150 MG CAPSULE    Take 1 capsule (150 mg total) by mouth 3 (three) times daily.   METHOCARBAMOL (ROBAXIN) 500 MG TABLET    Take 1 tablet (500 mg total) by mouth every 6 (six) hours as needed for muscle spasms.   NORTRIPTYLINE (PAMELOR) 10 MG CAPSULE    TAKE 4 CAPSULES BY MOUTH AT BEDTIME   PRAVASTATIN (PRAVACHOL) 40 MG TABLET    Take 1 tablet (40 mg total) by mouth every evening.   SENNA (SENOKOT) 8.6 MG TABS TABLET     Take 2 tablets (17.2 mg total) by mouth 2 (two) times daily.  Modified Medications   No medications on file  Discontinued Medications   No medications on file    HPI: Danny Fuller is a 43 y.o. male dm2 with left diabetic foot ulcer/om s/p tma here for f/u  He finished cefdinir and metronidazole for total 2 weeks including inhospital iv abx. No f/c There is some medial eschar and opened wound still. He is ambulating and weight bearing  He'll see his surgeon tomorrow for suture removal  Review of Systems: ROS  Negative 11 point ros unless mentioned above  Past Medical History:  Diagnosis Date  . Asthma   . Back pain    Bulging Disk  . Chronic kidney disease    sees Dr. Jamal Maes    . Diabetes mellitus    sees Dr. Dwyane Dee   . Diabetic foot ulcers ([place])    sees Dr. Wylene Simmer   . GERD (gastroesophageal reflux disease)   . Headache(784.0)   . Hyperlipidemia   . Hypertension    sees Dr. Percival Spanish   . Sleep apnea    CPAP    Social History   Tobacco Use  . Smoking status: Former Smoker    Packs/day: 0.50    Years: 20.00    Pack years: 10.00    Types: Cigarettes    Quit date: 07/23/2015    Years since quitting: 4.4  . Smokeless tobacco: Never Used  Vaping Use  . Vaping Use: Never used  Substance Use Topics  . Alcohol use: No    Alcohol/week: 0.0 standard drinks  . Drug use: No    Family History  Problem Relation Age of Onset  . Arthritis Other   . Diabetes Other   . Hypertension Other   . Hyperlipidemia Other   . Stroke Other   . Sudden death Other   . Diabetes Mother   . Diabetes Sister   . Diabetes Brother   . Heart attack Father        Died ate 04-05-2050, couple of "heart attacks"    Allergies  Allergen Reactions  . Augmentin [Amoxicillin-Pot Clavulanate] Itching and Other (See Comments)    Dizziness  . Vancomycin Other (See Comments)    AKI    OBJECTIVE: Vitals:   12/30/19 1351  BP: (!) 191/121  Pulse: 99  Temp: 97.6 F (36.4 C)    TempSrc: Oral  Weight: 270 lb (122.5 kg)   Body mass index is 38.74 kg/m.   Physical Exam No distress, pleasant Normocephalic; per; conj clear cv rrr no mrg Lungs clear abd s/nt Ext no edema msk tma left foot; suture intact; incision healed; medially there is a 1 inch opening with some eschar in middle and clean based  otherwies; nontender no swelling and no purulence  Skin no rash otherwise  Lab:  Microbiology: No results found for this or any previous visit (from the past 240 hour(s)).  Serology:  Imaging:   Assessment/plan:    Patient Active Problem List   Diagnosis Date Noted  . Osteomyelitis of ankle and foot ([place]) 11/20/2019  . Ulcer of left foot due to type 2 diabetes mellitus ([place]) 10/24/2019  . Educated about COVID-19 virus infection 06/23/2019  . Diabetic peripheral neuropathy ([place]) 12/16/2018  . Pain due to onychomycosis of toenails of both feet 08/07/2018  . Pre-ulcerative calluses 08/07/2018  . Acquired absence of left foot ([place]) 08/07/2018  . Hypotension 04/04/2018  . Leukocytosis 04/04/2018  . Dyslipidemia 04/01/2018  . Pulmonary hypertension ([place]) 04/01/2018  . Chronic low back pain with bilateral sciatica 07/24/2017  . Hyperlipidemia 03/02/2017  . Chest pain 03/02/2017  . Acute renal failure superimposed on stage 2 chronic kidney disease ([place]) 02/07/2017  . OSA (obstructive sleep apnea) 01/29/2017  . Insomnia 01/29/2017  . PVD (peripheral vascular disease) ([place]) 11/28/2016  . Palpitation 11/28/2016  . Erectile dysfunction associated with type 2 diabetes mellitus ([place]) 05/08/2016  . Erectile dysfunction due to type 2 diabetes mellitus ([place]) 05/08/2016  . Poorly controlled diabetes mellitus ([place]) 03/30/2016  . Necrotizing fasciitis ([place]) 03/07/2016  . Pyelonephritis 03/04/2016  . Acquired contracture of Achilles tendon, left 02/15/2016  . Arthralgia of multiple joints 01/19/2016  . Diabetic polyneuropathy associated with type 2 diabetes  mellitus ([place]) 01/18/2016  . Acquired absence of other left toe(s) ([place]) 01/18/2016  . Foot amputation status, left 01/04/2016  . Peripheral neuropathy 11/11/2015  . AKI (acute kidney injury) ([place])   . Asthma 07/23/2015  . Chronic headache 07/23/2015  . Erectile disorder due to medical condition in male 04/23/2015  . BMI 33.0-33.9,adult 04/21/2012  . GERD 04/21/2008  . Type 2 diabetes mellitus with diabetic neuropathy, with long-term current use of insulin ([place]) 11/21/2006  . Essential hypertension 11/21/2006  . Lumbar sprain 11/21/2006     Problem List Items Addressed This Visit    None    Visit Diagnoses    Diabetic foot infection ([place])    -  Primary   Relevant Orders   CBC   Comp Met (CMET)   C-reactive protein       I am having Danny B. Lal "Rod" maintain his Insulin Pen Needle, Lyrica, OmniPod Dash 5 Pack Pods, nortriptyline, cloNIDine, benazepril, furosemide, glucose blood, methocarbamol, pravastatin, glucose blood, HUMULIN R, empagliflozin, labetalol, latanoprost, senna, amLODipine, and insulin aspart.   No orders of the defined types were placed in this encounter.                                                           Assessment: Danny Fuller is a 44 y.o. male with DM2 on insulin, peripheral neuropathy, glaucoma, obesity, htn, hlp, chronic left foot ulcer infection with confirmed imaging evidence OM entire 4th metatarsal and abscess over dorsal lateral forefoot, failed conservative tx including abx/wound care, admitted for semielective TMA    I reviewed prior epic available micro; there is no hx mrsa or pseudomonas or mdro GNR dating back to 2017  Given the tma and disarticulation of the 4th mt, and the wound primarily closed, this suggest clean gross margin. Of note,  there are cases like this with skipped lesion and more proximal areas of surrounding metatarsal could be involved that are not seen on mri. For now reasonable to finish 10 day treatment  of mop up therapy for skin soft tissue cellulitis and have close monitoring   I explained at length for patient the issue with his foot progressing is usually source control where surgical debridement and excision of infected bone are required, rather than an antibiotics issue. He does report some uncertainty and also brought up the fact that augmentin had cause generalized itch and nausea with food intolerance. In this setting, I will leave him on some form of iv abx here for another 1-2 days. If clinical condition continue to improve, can switch to oral on discharge  ----- 11/16 id f/u Clinically doing well appear infection resolved. Open wound on the incision medially doesn't appear infected  -crp/cbc/cmp for baseline -if s/s of infection call clinic -f/u ortho as scheduled   Follow-up: Return if symptoms worsen or fail to improve.  Jabier Mutton, [facility] for Infectious Disease [place] -- -- pager   [phone] cell 12/30/2019, 2:17 PM p

## 2019-12-30 NOTE — Patient Instructions (Signed)
You don't have any sign symptoms of infection now  Watch for pain/swelling/purulence and or fever/chill... call our clinic for evaluation if these occur  Discuss with your surgeon about weight bearing on the foot as it has an open wound and might do better with avoiding putting pressure on it   Thank you

## 2019-12-31 LAB — CBC
HCT: 41.4 % (ref 38.5–50.0)
Hemoglobin: 14 g/dL (ref 13.2–17.1)
MCH: 29.7 pg (ref 27.0–33.0)
MCHC: 33.8 g/dL (ref 32.0–36.0)
MCV: 87.9 fL (ref 80.0–100.0)
MPV: 9.7 fL (ref 7.5–12.5)
Platelets: 358 10*3/uL (ref 140–400)
RBC: 4.71 10*6/uL (ref 4.20–5.80)
RDW: 13.2 % (ref 11.0–15.0)
WBC: 8.4 10*3/uL (ref 3.8–10.8)

## 2019-12-31 LAB — COMPREHENSIVE METABOLIC PANEL
AG Ratio: 1.2 (calc) (ref 1.0–2.5)
ALT: 13 U/L (ref 9–46)
AST: 13 U/L (ref 10–40)
Albumin: 3.7 g/dL (ref 3.6–5.1)
Alkaline phosphatase (APISO): 80 U/L (ref 36–130)
BUN: 13 mg/dL (ref 7–25)
CO2: 23 mmol/L (ref 20–32)
Calcium: 9.2 mg/dL (ref 8.6–10.3)
Chloride: 105 mmol/L (ref 98–110)
Creat: 1.15 mg/dL (ref 0.60–1.35)
Globulin: 3.2 g/dL (calc) (ref 1.9–3.7)
Glucose, Bld: 184 mg/dL — ABNORMAL HIGH (ref 65–99)
Potassium: 4.2 mmol/L (ref 3.5–5.3)
Sodium: 137 mmol/L (ref 135–146)
Total Bilirubin: 0.4 mg/dL (ref 0.2–1.2)
Total Protein: 6.9 g/dL (ref 6.1–8.1)

## 2019-12-31 LAB — C-REACTIVE PROTEIN: CRP: 6.4 mg/L (ref <8.0)

## 2020-01-14 ENCOUNTER — Other Ambulatory Visit: Payer: Self-pay | Admitting: *Deleted

## 2020-01-14 NOTE — Patient Outreach (Signed)
[facility]) Care Management  01/14/2020  WILMAN TUCKER 1975/12/16 [id number]   Call placed to member, successful however he report he is on his way to an MD appointment.  State he will call this care manager back.  Will await call back, if no call back will follow up within the next 3-4 business days.  Valente David, [place], MSN [place] [phone]

## 2020-01-15 ENCOUNTER — Other Ambulatory Visit: Payer: Self-pay

## 2020-01-15 ENCOUNTER — Ambulatory Visit (HOSPITAL_BASED_OUTPATIENT_CLINIC_OR_DEPARTMENT_OTHER): Payer: Medicare Other | Attending: Cardiology | Admitting: Cardiology

## 2020-01-15 DIAGNOSIS — R0683 Snoring: Secondary | ICD-10-CM | POA: Insufficient documentation

## 2020-01-15 DIAGNOSIS — G4733 Obstructive sleep apnea (adult) (pediatric): Secondary | ICD-10-CM | POA: Diagnosis not present

## 2020-01-15 DIAGNOSIS — E119 Type 2 diabetes mellitus without complications: Secondary | ICD-10-CM | POA: Insufficient documentation

## 2020-01-15 DIAGNOSIS — G4730 Sleep apnea, unspecified: Secondary | ICD-10-CM | POA: Diagnosis not present

## 2020-01-16 NOTE — Procedures (Signed)
Patient Name: Danny Fuller, Danny Fuller Date: 01/15/2020 Gender: Male D.O.B: 1975/06/08 Age (years): 44 Referring Provider: Minus Breeding Height (inches): 74 Interpreting Physician: Fransico Him MD, ABSM Weight (lbs): 265 RPSGT: Zadie Rhine BMI: 38 MRN: [id number] Neck Size: 20.50  CLINICAL INFORMATION Sleep Study Type: Split Night CPAP  Indication for sleep study: Diabetes, OSA, Snoring  Epworth Sleepiness Score: 14  SLEEP STUDY TECHNIQUE As per the AASM Manual for the Scoring of Sleep and Associated Events v2.3 (April 2016) with a hypopnea requiring 4% desaturations.  The channels recorded and monitored were frontal, central and occipital EEG, electrooculogram (EOG), submentalis EMG (chin), nasal and oral airflow, thoracic and abdominal wall motion, anterior tibialis EMG, snore microphone, electrocardiogram, and pulse oximetry. Continuous positive airway pressure (CPAP) was initiated when the patient met split night criteria and was titrated according to treat sleep-disordered breathing.  MEDICATIONS Medications self-administered by patient taken the night of the study : N/A  RESPIRATORY PARAMETERS Diagnostic Total AHI (/hr): 13.6  RDI (/hr):21.4  OA Index (/hr): 0.5  CA Index (/hr): 0.5 REM AHI (/hr): 22.0  NREM AHI (/hr):10.6  Supine AHI (/hr):15.1  Non-supine AHI (/hr):12.3 Min O2 Sat (%):82.0  Mean O2 (%): 94.4  Time below 88% (min):3.2   Titration Optimal Pressure (cm):8  AHI at Optimal Pressure (/hr):0.3  Min O2 at Optimal Pressure (%):92.0 Supine % at Optimal (%):74 Sleep % at Optimal (%):99   SLEEP ARCHITECTURE The recording time for the entire night was 404.9 minutes.  During a baseline period of 181.1 minutes, the patient slept for 132.0 minutes in REM and nonREM, yielding a sleep efficiency of 72.9%. Sleep onset after lights out was 5.0 minutes with a REM latency of 82.0 minutes. The patient spent 7.6% of the night in stage N1 sleep, 65.5% in stage  N2 sleep, 0.0% in stage N3 and 26.9% in REM.  During the titration period of 220.4 minutes, the patient slept for 218.5 minutes in REM and nonREM, yielding a sleep efficiency of 99.1%. Sleep onset after CPAP initiation was 0.0 minutes with a REM latency of 47.5 minutes. The patient spent 1.4% of the night in stage N1 sleep, 55.6% in stage N2 sleep, 0.0% in stage N3 and 43% in REM.  CARDIAC DATA The 2 lead EKG demonstrated sinus rhythm. The mean heart rate was 100.0 beats per minute. Other EKG findings include: None  LEG MOVEMENT DATA The total Periodic Limb Movements of Sleep (PLMS) were 0. The PLMS index was 0.0 .  IMPRESSIONS - Mild obstructive sleep apnea occurred during the diagnostic portion of the study (AHI = 13.6 /hour). An optimal PAP pressure was selected for this patient ( 8 cm of water) - No significant central sleep apnea occurred during the diagnostic portion of the study (CAI = 0.5/hour). - The patient had minimal or no oxygen desaturation during the diagnostic portion of the study (Min O2 = 82.0%) - No snoring was audible during the diagnostic portion of the study. - Clinically significant periodic limb movements did not occur during sleep.  DIAGNOSIS - Obstructive Sleep Apnea (G47.33)  RECOMMENDATIONS - Trial of CPAP therapy on 8 cm H2O with a Standard size Resmed Nasal Mask Mirage FX mask and heated humidification. - Avoid alcohol, sedatives and other CNS depressants that may worsen sleep apnea and disrupt normal sleep architecture. - Sleep hygiene should be reviewed to assess factors that may improve sleep quality. - Weight management and regular exercise should be initiated or continued. - Return to Sleep Center for re-evaluation after  8 weeks of therapy  [Electronically signed] 01/16/2020 08:53 PM  Fransico Him MD, ABSM Diplomate, American Board of Sleep Medicine

## 2020-01-17 ENCOUNTER — Telehealth: Payer: Self-pay | Admitting: *Deleted

## 2020-01-17 NOTE — Telephone Encounter (Signed)
Informed patient of sleep study results and patient understanding was verbalized. Patient understands her sleep study showed they have OSA and had a successful PAP titration and let DME know that orders are in EPIC. Please set up 8 week OV with me.  Pt is aware and agreeable to his results.  Upon patient request DME selection is [facility]. Patient understands she/he will be contacted by [facility] to set up her/he cpap. Patient understands to call if [facility] does not contact her/he with new setup in a timely manner. Patient understands they will be called once confirmation has been received from [facility] that they have received their new machine to schedule 10 week follow up appointment.   [facility] notified of new cpap order  Please add to airview Patient was grateful for the call and thanked me.

## 2020-01-17 NOTE — Telephone Encounter (Signed)
-----   Message from Sueanne Margarita, MD sent at 01/16/2020  8:58 PM EST ----- Please let patient know that they have OSA and had a successful PAP titration and let DME know that orders are in EPIC.  Please set up 8 week OV with me.

## 2020-01-19 ENCOUNTER — Other Ambulatory Visit: Payer: Self-pay | Admitting: *Deleted

## 2020-01-19 NOTE — Patient Outreach (Signed)
[facility]) Care Management  01/19/2020  DONDI AIME December 27, 1975 [id number]   Outreach attempt #2, member state he is unable to talk at this time.  State he will call this care manager back once he is home.  Will await call back, if no call back will follow up within the next 3-4 business days.  Valente David, [place], MSN [place] [phone]

## 2020-01-22 ENCOUNTER — Other Ambulatory Visit: Payer: Self-pay | Admitting: *Deleted

## 2020-01-22 NOTE — Patient Outreach (Signed)
[place]) Care Management  01/22/2020  Danny Fuller [birth year] [id number]   Outreach attempt #2, successful.  Member report he has been doing better with managing his diabetes.  He has started seeing a new endocrinologist within the [facility] group [name], NP), placed on 7.67 of Trulicity weekly.  Insulin pump was changed to provide less of a basal dose and more of a carbohydrate coverage with meals.  Report blood sugars have ranged from 90's-140's since making the changes.  He has follow up on 12/21, expecting to have A1C rechecked between then and January.  Denies any urgent concerns, discussed managing diet throughout the holidays.  Verbalizes understanding, encouraged to contact this care manager with question.  Agrees to follow up within the next month.  Goals    . [facility] - Monitor and Manage My Blood Sugar     Follow Up Date 11/19   - check blood sugar at prescribed times - check blood sugar before and after exercise - check blood sugar if I feel it is too high or too low - enter blood sugar readings and medication or insulin into daily log    Why is this important?   Checking your blood sugar at home helps to keep it from getting very high or very low.  Writing the results in a diary or log helps the doctor know how to care for you.  Your blood sugar log should have the time, date and the results.  Also, write down the amount of insulin or other medicine that you take.  Other information, like what you ate, exercise done and how you were feeling, will also be helpful.     Notes:   12/9 - discussed insulin changes, now taking Trulicity, re-educated on diet    . [facility] - Set My Target A1C     Follow Up Date 02/28/2020   - set target A1C (less than 8)    Why is this important?   Your target A1C is decided together by you and your doctor.  It is based on several things like your age and other health issues.    Notes:  12/9 - Last  A1C in October greater then 10, confirmed new endocrinologist      Valente David, RN, MSN [place] Manager [phone]

## 2020-02-02 ENCOUNTER — Other Ambulatory Visit: Payer: Self-pay

## 2020-02-02 ENCOUNTER — Ambulatory Visit (INDEPENDENT_AMBULATORY_CARE_PROVIDER_SITE_OTHER): Payer: Medicare Other

## 2020-02-02 VITALS — BP 170/110 | HR 99 | Temp 98.1°F | Ht 70.0 in | Wt 268.2 lb

## 2020-02-02 DIAGNOSIS — Z0000 Encounter for general adult medical examination without abnormal findings: Secondary | ICD-10-CM | POA: Diagnosis not present

## 2020-02-02 NOTE — Progress Notes (Addendum)
Subjective:   Danny Fuller is a 44 y.o. male who presents for an Initial Medicare Annual Wellness Visit.  Review of Systems    N/A  Cardiac Risk Factors include: dyslipidemia;hypertension;diabetes mellitus;male gender     Objective:    Today's Vitals   02/02/20 0858 02/02/20 0909  BP: (!) 190/108 (!) 170/110  Pulse: 99   Temp: 98.1 F (36.7 C)   TempSrc: Oral   SpO2: 99%   Weight: 268 lb 4 oz (121.7 kg)   Height: 5\' 10"  (1.778 m)   PainSc:  8    Body mass index is 38.49 kg/m.  Advanced Directives 02/02/2020 01/15/2020 12/15/2019 11/28/2019 11/21/2019 11/20/2019 05/26/2019  Does Patient Have a Medical Advance Directive? No No No No No No Yes  Would patient like information on creating a medical advance directive? Yes (MAU/Ambulatory/Procedural Areas - Information given) Yes (MAU/Ambulatory/Procedural Areas - Information given) No - Patient declined - No - Patient declined Yes (MAU/Ambulatory/Procedural Areas - Information given) -    Current Medications (verified) Outpatient Encounter Medications as of 02/02/2020  Medication Sig   amLODipine (NORVASC) 10 MG tablet Take 1 tablet (10 mg total) by mouth daily.   benazepril (LOTENSIN) 40 MG tablet Take 1 tablet (40 mg total) by mouth daily.   cloNIDine (CATAPRES - DOSED IN MG/24 HR) 0.1 mg/24hr patch APPLY 1 PATCH(0.1 MG) EXTERNALLY TO THE SKIN 1 TIME A WEEK (Patient taking differently: Place 0.1 mg onto the skin once a week. Sundays)   furosemide (LASIX) 20 MG tablet Take 1 tablet (20 mg total) by mouth daily. (Patient taking differently: Take 20 mg by mouth as needed for fluid (swelling).)   glucose blood test strip Use Bayer Contour Next Test strips as instructed to check blood sugar 4 times daily.   glucose blood test strip 1 each by Other route 4 (four) times daily.    insulin aspart (NOVOLOG) 100 UNIT/ML injection Use maximum of 0.6 mL/day in insulin pump   Insulin Disposable Pump (OMNIPOD DASH 5 PACK PODS) MISC  Inject 1 each into the skin continuous. Apply 1 OmniPod insulin pump to body daily for insulin delivery.   Insulin Pen Needle 31G X 5 MM MISC 1 each by Other route daily.    labetalol (NORMODYNE) 100 MG tablet Take 1 tablet (100 mg total) by mouth 2 (two) times daily. TAKE 1 TABLET BY MOUTH TWICE DAILY. (Patient taking differently: Take 100 mg by mouth 2 (two) times daily.)   latanoprost (XALATAN) 0.005 % ophthalmic solution Place 1 drop into both eyes daily.   LYRICA 150 MG capsule Take 1 capsule (150 mg total) by mouth 3 (three) times daily.   nortriptyline (PAMELOR) 10 MG capsule TAKE 4 CAPSULES BY MOUTH AT BEDTIME (Patient taking differently: Take 40 mg by mouth at bedtime.)   pravastatin (PRAVACHOL) 40 MG tablet Take 1 tablet (40 mg total) by mouth every evening. (Patient taking differently: Take 40 mg by mouth daily.)   empagliflozin (JARDIANCE) 25 MG TABS tablet Take 1 tablet (25 mg total) by mouth daily. (Patient not taking: Reported on 02/02/2020)   insulin regular human CONCENTRATED (HUMULIN R) 500 UNIT/ML injection Use maximum of 0.6 mL/day in insulin pump   methocarbamol (ROBAXIN) 500 MG tablet Take 1 tablet (500 mg total) by mouth every 6 (six) hours as needed for muscle spasms. (Patient not taking: Reported on 02/02/2020)   senna (SENOKOT) 8.6 MG TABS tablet Take 2 tablets (17.2 mg total) by mouth 2 (two) times daily. (Patient not taking:  Reported on 02/02/2020)   No facility-administered encounter medications on file as of 02/02/2020.    Allergies (verified) Augmentin [amoxicillin-pot clavulanate] and Vancomycin   History: Past Medical History:  Diagnosis Date   Asthma    Back pain    Bulging Disk   Chronic kidney disease    sees Dr. Jamal Maes     Diabetes mellitus    sees Dr. Dwyane Dee    Diabetic foot ulcers [facility])    sees Dr. Wylene Simmer    GERD (gastroesophageal reflux disease)    Glaucoma    Headache(784.0)    Hyperlipidemia    Hypertension     sees Dr. Percival Spanish    Sleep apnea    CPAP   Past Surgical History:  Procedure Laterality Date   AMPUTATION Left 07/26/2015   Procedure: AMPUTATION LEFT FIFTH RAY;  Surgeon: [name], MD;  Location: [place];  Service: Orthopedics;  Laterality: Left;   bilateral hip pins placed     INCISION AND DRAINAGE PERIRECTAL ABSCESS Right 03/07/2016   Procedure: IRRIGATION AND DEBRIDEMENT PERIRECTAL ABSCESS;  Surgeon: Alphonsa Overall, MD;  Location: [facility];  Service: General;  Laterality: Right;   INCISION AND DRAINAGE PERIRECTAL ABSCESS Right 03/09/2016   Procedure: EXAM UNDER ANESTHESIA, IRRIGATION AND DEBRIDEMENT PERIRECTAL ABSCESS;  Surgeon: Johnathan Hausen, MD;  Location: [facility];  Service: General;  Laterality: Right;  Open, betadine packed wound   TOE AMPUTATION Left 11/19/2019   Removal of all toes from left foot   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/20/2019   Procedure: TRANSMETATARSAL AMPUTATION LEFT FOOT;  Surgeon: Wylene Simmer, MD;  Location: [place];  Service: Orthopedics;  Laterality: Left;   Family History  Problem Relation Age of Onset   Arthritis Other    Diabetes Other    Hypertension Other    Hyperlipidemia Other    Stroke Other    Sudden death Other    Diabetes Mother    Diabetes Sister    Diabetes Brother    Heart attack Father        Died ate 35, couple of "heart attacks"    Social History   Socioeconomic History   Marital status: Married    Spouse name: Not on file   Number of children: 2   Years of education: 12   Highest education level: High school graduate  Occupational History   Occupation: disability  Tobacco Use   Smoking status: Former Smoker    Packs/day: 0.50    Years: 20.00    Pack years: 10.00    Types: Cigarettes    Quit date: 07/23/2015    Years since quitting: 4.5   Smokeless tobacco: Never Used  Vaping Use   Vaping Use: Never used  Substance and Sexual Activity   Alcohol use: No    Alcohol/week: 0.0 standard drinks    Drug use: No   Sexual activity: Not on file  Other Topics Concern   Not on file  Social History Narrative   Lives with wife and two children in a one story home.  Not working.  Still trying for disability.     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     Education: high school.    Left handed   One story home   Social Determinants of Health   Financial Resource Strain: Low Risk    Difficulty of Paying Living Expenses: Not hard at all  Food Insecurity: No Food Insecurity   Worried About Charity fundraiser in the Last  Year: Never true   [place] in the Last Year: Never true  Transportation Needs: No Transportation Needs   Lack of Transportation (Medical): No   Lack of Transportation (Non-Medical): No  Physical Activity: Inactive   Days of Exercise per Week: 0 days   Minutes of Exercise per Session: 0 min  Stress: No Stress Concern Present   Feeling of Stress : Not at all  Social Connections: Moderately Integrated   Frequency of Communication with Friends and Family: More than three times a week   Frequency of Social Gatherings with Friends and Family: More than three times a week   Attends Religious Services: More than 4 times per year   Active Member of [employer] or Organizations: Yes   Attends Music therapist: More than 4 times per year   Marital Status: Divorced    Tobacco Counseling Counseling given: Not Answered   Clinical Intake:  Pre-visit preparation completed: Yes  Pain : 0-10 Pain Score: 8  Pain Type: Acute pain Pain Location: Shoulder Pain Orientation: Right Pain Descriptors / Indicators: Stabbing Pain Frequency: Intermittent     Nutritional Risks: Nausea/ vomitting/ diarrhea (Nausea) Diabetes: Yes CBG done?: No Did pt. bring in CBG monitor from home?: No  How often do you need to have someone help you when you read instructions, pamphlets, or other written materials from your doctor or pharmacy?: 1  - Never What is the last grade level you completed in school?: 12th grade  Diabetic?Yes Nutrition Risk Assessment:  Has the patient had any N/V/D within the last 2 months?  Yes  Does the patient have any non-healing wounds?  No  Has the patient had any unintentional weight loss or weight gain?  No   Diabetes:  Is the patient diabetic?  Yes  If diabetic, was a CBG obtained today?  No  Did the patient bring in their glucometer from home?  No  How often do you monitor your CBG's? Patient states checks glucose 5-6 times per day.   Financial Strains and Diabetes Management:  Are you having any financial strains with the device, your supplies or your medication? No .  Does the patient want to be seen by Chronic Care Management for management of their diabetes?  No  Would the patient like to be referred to a Nutritionist or for Diabetic Management?  No   Diabetic Exams:  Diabetic Eye Exam: Overdue for diabetic eye exam. Pt has been advised about the importance in completing this exam. Patient advised to call and schedule an eye exam. Diabetic Foot Exam: Overdue, Pt has been advised about the importance in completing this exam. Pt is scheduled for diabetic foot exam on 02/04/2020.   Interpreter Needed?: No  Information entered by :: [place] of Daily Living In your present state of health, do you have any difficulty performing the following activities: 02/02/2020 11/20/2019  Hearing? N -  Vision? N -  Difficulty concentrating or making decisions? N -  Walking or climbing stairs? Y -  Comment due to balance issues, and knee pain -  Dressing or bathing? Y -  Doing errands, shopping? N Y  Conservation officer, nature and eating ? N -  Using the Toilet? N -  In the past six months, have you accidently leaked urine? N -  Do you have problems with loss of bowel control? N -  Managing your Medications? N -  Managing your Finances? N -  Housekeeping or managing your Housekeeping? N -  Some recent data might be hidden    Patient Care Team: [name], MD as PCP - General (Family Medicine) Minus Breeding, MD as PCP - Cardiology (Cardiology) Alda Berthold, DO as Consulting Physician (Neurology) Valente David, RN as [place] any recent [place] you may have received from other than [facility] providers in the past year (date may be approximate).     Assessment:   This is a routine wellness examination for [place].  Hearing/Vision screen  Hearing Screening   125Hz  250Hz  500Hz  1000Hz  2000Hz  3000Hz  4000Hz  6000Hz  8000Hz   Right ear:           Left ear:           Vision Screening Comments: Patient states gets eyes checked every 3-6 months   Dietary issues and exercise activities discussed: Current Exercise Habits: The patient does not participate in regular exercise at present  Goals     Patient Stated     I would like to go ice fishing.     THN - Monitor and Manage My Blood Sugar     Follow Up Date 11/19   - check blood sugar at prescribed times - check blood sugar before and after exercise - check blood sugar if I feel it is too high or too low - enter blood sugar readings and medication or insulin into daily log    Why is this important?   Checking your blood sugar at home helps to keep it from getting very high or very low.  Writing the results in a diary or log helps the doctor know how to care for you.  Your blood sugar log should have the time, date and the results.  Also, write down the amount of insulin or other medicine that you take.  Other information, like what you ate, exercise done and how you were feeling, will also be helpful.     Notes:   12/9 - discussed insulin changes, now taking Trulicity, re-educated on diet     THN - Set My Target A1C     Follow Up Date 02/28/2020   - set target A1C (less than 8)    Why is this important?   Your target A1C is decided together by you and your  doctor.  It is based on several things like your age and other health issues.    Notes:  12/9 - Last A1C in October greater then 10, confirmed new endocrinologist      Depression Screen [facility] 2/9 Scores 02/02/2020 12/02/2019 11/15/2017 05/02/2016 03/30/2016 12/28/2015 11/11/2015  PHQ - 2 Score 0 1 0 0 0 0 1  PHQ- 9 Score 0 - - - - - -  Exception Documentation - (No Data) - - - - -    Fall Risk Fall Risk  02/02/2020 12/02/2019 11/28/2019 05/26/2019 01/13/2019  Falls in the past year? 1 1 1 1  0  Number falls in past yr: 1 1 1  0 0  Injury with Fall? 0 0 0 0 0  Risk Factor Category  - - - - -  Risk for fall due to : Impaired balance/gait - - - -  Follow up Falls evaluation completed;Falls prevention discussed - - - -    FALL RISK PREVENTION PERTAINING TO THE HOME:  Any stairs in or around the home? No  If so, are there any without handrails? No  Home free of loose throw rugs in walkways, pet beds, electrical cords, etc? Yes  Adequate lighting in your home to reduce risk of falls? Yes   ASSISTIVE DEVICES UTILIZED TO PREVENT FALLS:  Life alert? No  Use of a cane, walker or w/c? No  Grab bars in the bathroom? No  Shower chair or bench in shower? No  Elevated toilet seat or a handicapped toilet? No   TIMED UP AND GO:  Was the test performed? Yes .  Length of time to ambulate 10 feet: 4 sec.   Gait steady and fast without use of assistive device  Cognitive Function:        Immunizations Immunization History  Administered Date(s) Administered   Influenza,inj,Quad PF,6+ Mos 11/11/2015, 11/29/2016, 01/01/2018, 11/04/2018, 11/23/2019   PFIZER SARS-COV-2 Vaccination 04/12/2019, 05/03/2019    TDAP status: Due, Education has been provided regarding the importance of this vaccine. Advised may receive this vaccine at local pharmacy or Health Dept. Aware to provide a copy of the vaccination record if obtained from local pharmacy or Health Dept. Verbalized acceptance and  understanding.  Flu Vaccine status: Up to date  Pneumococcal vaccine status: Up to date  Covid-19 vaccine status: Completed vaccines  Qualifies for Shingles Vaccine? Yes   Zostavax completed No   Shingrix Completed?: No.    Education has been provided regarding the importance of this vaccine. Patient has been advised to call insurance company to determine out of pocket expense if they have not yet received this vaccine. Advised may also receive vaccine at local pharmacy or Health Dept. Verbalized acceptance and understanding.  Screening Tests Health Maintenance  Topic Date Due   Hepatitis C Screening  Never done   PNEUMOCOCCAL POLYSACCHARIDE VACCINE AGE 56-64 HIGH RISK  Never done   OPHTHALMOLOGY EXAM  Never done   TETANUS/TDAP  Never done   FOOT EXAM  09/15/2016   HEMOGLOBIN A1C  05/21/2020   INFLUENZA VACCINE  Completed   COVID-19 Vaccine  Completed   HIV Screening  Completed    Health Maintenance  Health Maintenance Due  Topic Date Due   Hepatitis C Screening  Never done   PNEUMOCOCCAL POLYSACCHARIDE VACCINE AGE 56-64 HIGH RISK  Never done   OPHTHALMOLOGY EXAM  Never done   TETANUS/TDAP  Never done   FOOT EXAM  09/15/2016    Colorectal cancer Screening: Not required at this age  Lung Cancer Screening: (Low Dose CT Chest recommended if Age 55-80 years, 30 pack-year currently smoking OR have quit w/in 15years.) does not qualify.   Lung Cancer Screening Referral: N/A   Additional Screening:  Hepatitis C Screening: does qualify;   Vision Screening: Recommended annual ophthalmology exams for early detection of glaucoma and other disorders of the eye. Is the patient up to date with their annual eye exam?  Yes  Who is the provider or what is the name of the office in which the patient attends annual eye exams? Dr. [place]  If pt is not established with a provider, would they like to be referred to a provider to establish care? No .   Dental Screening:  Recommended annual dental exams for proper oral hygiene  Community Resource Referral / Chronic Care Management: CRR required this visit?  No   CCM required this visit?  No      Plan:     I have personally reviewed and noted the following in the patients chart:    Medical and social history  Use of alcohol, tobacco or illicit drugs   Current medications and supplements  Functional ability and status  Nutritional status  Physical activity  Advanced directives  List of other physicians  Hospitalizations, surgeries, and ER visits in previous 12 months  Vitals  Screenings to include cognitive, depression, and falls  Referrals and appointments  In addition, I have reviewed and discussed with patient certain preventive protocols, quality metrics, and best practice recommendations. A written personalized care plan for preventive services as well as general preventive health recommendations were provided to patient.     Ofilia Neas, LPN   [id number]   Nurse Notes: None

## 2020-02-02 NOTE — Patient Instructions (Signed)
[name] , Thank you for taking time to come for your Medicare Wellness Visit. I appreciate your ongoing commitment to your health goals. Please review the following plan we discussed and let me know if I can assist you in the future.   Screening recommendations/referrals: Colonoscopy: Not required at this age Recommended yearly ophthalmology/optometry visit for glaucoma screening and checkup Recommended yearly dental visit for hygiene and checkup  Vaccinations: Influenza vaccine: Up to date, next due fall 2022  Pneumococcal vaccine: Not due at this age Tdap vaccine: Currently due, you may contact your insurance company to discuss cost or you may await and injury to receive  Shingles vaccine: Not due for Shingrix until age 27    Advanced directives: Advance directive discussed with you today. Even though you declined this today please call our office should you change your mind and we can give you the proper paperwork for you to fill out.   Conditions/risks identified: Please try to incorporate some physical activity into your daily routine   Next appointment: 02/02/2021 @ 9:00 am with [place] 40-64 Years, Male Preventive care refers to lifestyle choices and visits with your health care provider that can promote health and wellness. What does preventive care include?  A yearly physical exam. This is also called an annual well check.  Dental exams once or twice a year.  Routine eye exams. Ask your health care provider how often you should have your eyes checked.  Personal lifestyle choices, including:  Daily care of your teeth and gums.  Regular physical activity.  Eating a healthy diet.  Avoiding tobacco and drug use.  Limiting alcohol use.  Practicing safe sex.  Taking low-dose aspirin every day starting at age 45. What happens during an annual well check? The services and screenings done by your health care provider during your  annual well check will depend on your age, overall health, lifestyle risk factors, and family history of disease. Counseling  Your health care provider may ask you questions about your:  Alcohol use.  Tobacco use.  Drug use.  Emotional well-being.  Home and relationship well-being.  Sexual activity.  Eating habits.  Work and work Statistician. Screening  You may have the following tests or measurements:  Height, weight, and BMI.  Blood pressure.  Lipid and cholesterol levels. These may be checked every 5 years, or more frequently if you are over 48 years old.  Skin check.  Lung cancer screening. You may have this screening every year starting at age 48 if you have a 30-pack-year history of smoking and currently smoke or have quit within the past 15 years.  Fecal occult blood test (FOBT) of the stool. You may have this test every year starting at age 10.  Flexible sigmoidoscopy or colonoscopy. You may have a sigmoidoscopy every 5 years or a colonoscopy every 10 years starting at age 67.  Prostate cancer screening. Recommendations will vary depending on your family history and other risks.  Hepatitis C blood test.  Hepatitis B blood test.  Sexually transmitted disease (STD) testing.  Diabetes screening. This is done by checking your blood sugar (glucose) after you have not eaten for a while (fasting). You may have this done every 1-3 years. Discuss your test results, treatment options, and if necessary, the need for more tests with your health care provider. Vaccines  Your health care provider may recommend certain vaccines, such as:  Influenza vaccine. This is recommended every year.  Tetanus, diphtheria, and acellular pertussis (Tdap, Td) vaccine. You may need a Td booster every 10 years.  Zoster vaccine. You may need this after age 28.  Pneumococcal 13-valent conjugate (PCV13) vaccine. You may need this if you have certain conditions and have not been  vaccinated.  Pneumococcal polysaccharide (PPSV23) vaccine. You may need one or two doses if you smoke cigarettes or if you have certain conditions. Talk to your health care provider about which screenings and vaccines you need and how often you need them. This information is not intended to replace advice given to you by your health care provider. Make sure you discuss any questions you have with your health care provider. Document Released: 02/26/2015 Document Revised: 10/20/2015 Document Reviewed: 12/01/2014 Elsevier Interactive Patient Education  2017 [place] Prevention in the Home Falls can cause injuries. They can happen to people of all ages. There are many things you can do to make your home safe and to help prevent falls. What can I do on the outside of my home?  Regularly fix the edges of walkways and driveways and fix any cracks.  Remove anything that might make you trip as you walk through a door, such as a raised step or threshold.  Trim any bushes or trees on the path to your home.  Use bright outdoor lighting.  Clear any walking paths of anything that might make someone trip, such as rocks or tools.  Regularly check to see if handrails are loose or broken. Make sure that both sides of any steps have handrails.  Any raised decks and porches should have guardrails on the edges.  Have any leaves, snow, or ice cleared regularly.  Use sand or salt on walking paths during winter.  Clean up any spills in your garage right away. This includes oil or grease spills. What can I do in the bathroom?  Use night lights.  Install grab bars by the toilet and in the tub and shower. Do not use towel bars as grab bars.  Use non-skid mats or decals in the tub or shower.  If you need to sit down in the shower, use a plastic, non-slip stool.  Keep the floor dry. Clean up any water that spills on the floor as soon as it happens.  Remove soap buildup in the tub or shower  regularly.  Attach bath mats securely with double-sided non-slip rug tape.  Do not have throw rugs and other things on the floor that can make you trip. What can I do in the bedroom?  Use night lights.  Make sure that you have a light by your bed that is easy to reach.  Do not use any sheets or blankets that are too big for your bed. They should not hang down onto the floor.  Have a firm chair that has side arms. You can use this for support while you get dressed.  Do not have throw rugs and other things on the floor that can make you trip. What can I do in the kitchen?  Clean up any spills right away.  Avoid walking on wet floors.  Keep items that you use a lot in easy-to-reach places.  If you need to reach something above you, use a strong step stool that has a grab bar.  Keep electrical cords out of the way.  Do not use floor polish or wax that makes floors slippery. If you must use wax, use non-skid floor wax.  Do not have  throw rugs and other things on the floor that can make you trip. What can I do with my stairs?  Do not leave any items on the stairs.  Make sure that there are handrails on both sides of the stairs and use them. Fix handrails that are broken or loose. Make sure that handrails are as long as the stairways.  Check any carpeting to make sure that it is firmly attached to the stairs. Fix any carpet that is loose or worn.  Avoid having throw rugs at the top or bottom of the stairs. If you do have throw rugs, attach them to the floor with carpet tape.  Make sure that you have a light switch at the top of the stairs and the bottom of the stairs. If you do not have them, ask someone to add them for you. What else can I do to help prevent falls?  Wear shoes that:  Do not have high heels.  Have rubber bottoms.  Are comfortable and fit you well.  Are closed at the toe. Do not wear sandals.  If you use a stepladder:  Make sure that it is fully  opened. Do not climb a closed stepladder.  Make sure that both sides of the stepladder are locked into place.  Ask someone to hold it for you, if possible.  Clearly mark and make sure that you can see:  Any grab bars or handrails.  First and last steps.  Where the edge of each step is.  Use tools that help you move around (mobility aids) if they are needed. These include:  Canes.  Walkers.  Scooters.  Crutches.  Turn on the lights when you go into a dark area. Replace any light bulbs as soon as they burn out.  Set up your furniture so you have a clear path. Avoid moving your furniture around.  If any of your floors are uneven, fix them.  If there are any pets around you, be aware of where they are.  Review your medicines with your doctor. Some medicines can make you feel dizzy. This can increase your chance of falling. Ask your doctor what other things that you can do to help prevent falls. This information is not intended to replace advice given to you by your health care provider. Make sure you discuss any questions you have with your health care provider. Document Released: 11/26/2008 Document Revised: 07/08/2015 Document Reviewed: 03/06/2014 Elsevier Interactive Patient Education  2017 [employer].

## 2020-02-03 DIAGNOSIS — Z9641 Presence of insulin pump (external) (internal): Secondary | ICD-10-CM | POA: Diagnosis not present

## 2020-02-03 DIAGNOSIS — E1165 Type 2 diabetes mellitus with hyperglycemia: Secondary | ICD-10-CM | POA: Diagnosis not present

## 2020-02-03 DIAGNOSIS — E785 Hyperlipidemia, unspecified: Secondary | ICD-10-CM | POA: Diagnosis not present

## 2020-02-03 DIAGNOSIS — Z794 Long term (current) use of insulin: Secondary | ICD-10-CM | POA: Diagnosis not present

## 2020-02-03 DIAGNOSIS — E1159 Type 2 diabetes mellitus with other circulatory complications: Secondary | ICD-10-CM | POA: Diagnosis not present

## 2020-02-03 DIAGNOSIS — E1140 Type 2 diabetes mellitus with diabetic neuropathy, unspecified: Secondary | ICD-10-CM | POA: Diagnosis not present

## 2020-02-03 DIAGNOSIS — I10 Essential (primary) hypertension: Secondary | ICD-10-CM | POA: Diagnosis not present

## 2020-02-04 ENCOUNTER — Ambulatory Visit (INDEPENDENT_AMBULATORY_CARE_PROVIDER_SITE_OTHER): Payer: Medicare Other | Admitting: Family Medicine

## 2020-02-04 ENCOUNTER — Encounter: Payer: Self-pay | Admitting: Family Medicine

## 2020-02-04 ENCOUNTER — Other Ambulatory Visit: Payer: Self-pay

## 2020-02-04 VITALS — BP 160/90 | HR 105 | Temp 98.4°F | Ht 70.0 in | Wt 266.8 lb

## 2020-02-04 DIAGNOSIS — E1142 Type 2 diabetes mellitus with diabetic polyneuropathy: Secondary | ICD-10-CM

## 2020-02-04 DIAGNOSIS — L97529 Non-pressure chronic ulcer of other part of left foot with unspecified severity: Secondary | ICD-10-CM | POA: Diagnosis not present

## 2020-02-04 DIAGNOSIS — E1140 Type 2 diabetes mellitus with diabetic neuropathy, unspecified: Secondary | ICD-10-CM

## 2020-02-04 DIAGNOSIS — L84 Corns and callosities: Secondary | ICD-10-CM | POA: Diagnosis not present

## 2020-02-04 DIAGNOSIS — E11621 Type 2 diabetes mellitus with foot ulcer: Secondary | ICD-10-CM | POA: Diagnosis not present

## 2020-02-04 DIAGNOSIS — Z794 Long term (current) use of insulin: Secondary | ICD-10-CM | POA: Diagnosis not present

## 2020-02-04 NOTE — Progress Notes (Signed)
   Subjective:    Patient ID: Danny Fuller, male    DOB: 1975/09/11, 44 y.o.   MRN: [id number]  HPI Here to fill out forms for diabetic footwear. He saw [name] FNP yesterday for his diabetes, and this has imrpoved quite a bit. His A1c was down to 7.4. he has also been seeing [name] DPM for foot care. They have ordered footwear and he needs [name] to document that he has diabetes and that this has been closely managed. Rod feels well in general.    Review of Systems  Constitutional: Negative.   Respiratory: Negative.   Cardiovascular: Negative.   Neurological: Positive for numbness.       Objective:   Physical Exam Constitutional:      Appearance: Normal appearance.  Cardiovascular:     Rate and Rhythm: Normal rate and regular rhythm.     Pulses: Normal pulses.     Heart sounds: Normal heart sounds.  Pulmonary:     Effort: Pulmonary effort is normal.     Breath sounds: Normal breath sounds.  Musculoskeletal:     Right lower leg: No edema.     Left lower leg: No edema.  Skin:    Comments: The skin on the left foot is intact and warm   Neurological:     Mental Status: He is alert and oriented to person, place, and time.     Coordination: Coordination normal.     Gait: Gait normal.     Comments: He has decreased sensation to light touch on the dorsal and plantar surfaces of the left foot            Assessment & Plan:  His diabetes is under better control, but the neuropathy requires him to use diabetic footwear. We filled out the necessary forms for these. [name], MD

## 2020-02-19 ENCOUNTER — Other Ambulatory Visit: Payer: Self-pay | Admitting: *Deleted

## 2020-02-19 NOTE — Patient Outreach (Signed)
[facility]) Care Management  02/19/2020  Danny Fuller [birth year] [id number]   Outgoing call placed to member, no answer, HIPAA compliant voice message left.  Will follow up within the next 3-4 business days.  Kemper Durie, [place], MSN [facility] Care Management  [facility] Manager [phone]

## 2020-02-24 ENCOUNTER — Other Ambulatory Visit: Payer: Self-pay | Admitting: *Deleted

## 2020-02-24 NOTE — Patient Outreach (Signed)
[facility]) Care Management  [place]  02/24/2020   JASEN HARTSTEIN 1975/10/18 [id number]    Outreach attempt #2, unsuccessful, HIPAA compliant voice message left.  Will send outreach letter and follow up within the next 3-4 business days.  Valente David, [place], MSN [place] [phone]

## 2020-03-01 ENCOUNTER — Other Ambulatory Visit: Payer: Self-pay | Admitting: *Deleted

## 2020-03-01 NOTE — Patient Outreach (Signed)
[facility]) Care Management  [place]  03/01/2020   MOIZ RYANT 1975/05/04 [id number]   Outreach attempt #3, successful but member state he is at the store, unable to speak at this time.  Request to call this care manager back.  Will await call back, if no call back will follow up with 4th and final attempt within the next 4 weeks.  Valente David, [place], MSN [place] [phone]

## 2020-03-03 ENCOUNTER — Telehealth: Payer: Self-pay | Admitting: Cardiology

## 2020-03-03 NOTE — Progress Notes (Signed)
Cardiology Office Note   Date:  03/04/2020   ID:  DARRIAN Fuller, DOB 1975/08/05, MRN [id number]  PCP:  [name], MD  Cardiologist:   Minus Breeding, MD    Chief Complaint  Patient presents with  . Chest Pain      History of Present Illness: Danny Fuller is a 45 y.o. male who was referred by Dr. Lorrene Reid years ago for evaluation of palpitations.  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 He had a cardiac event monitor that demonstrated rare PVCs.  He had an echo with normal LV function although there was some moderate pulmonary HTN.   However, echo last year did not demonstrate pulmonary HTN.     He called the other day with an episode of chest pain.  He said the chest pain happened while he was watching TV.  There was left sided.  It was a throbbing ache.  He was clammy.  He was somewhat short of breath.  It was 10 out of 10.  There was some associated nausea.  It actually lasted probably 14 hours.  He went off to bed and when he woke up it was gone.  He refused to go to the emergency room.  He did take some Alka-Seltzer and had some improvement.  He has never had this before.  He otherwise has not had any recent problems.  He denies any palpitations, presyncope or syncope.  He has had no new chest pressure, neck or arm discomfort.  He has had no weight gain or edema.  He did have to have all of the toes of his left foot amputated since I last saw him because of nonhealing ulcers.  He has since checked his blood sugar much better under control.   Past Medical History:  Diagnosis Date  . Asthma   . Back pain    Bulging Disk  . Chronic kidney disease    sees Dr. Jamal Maes    . Diabetes mellitus    sees Dr. Dwyane Dee   . Diabetic foot ulcers ([place])    sees Dr. Wylene Simmer   . GERD (gastroesophageal reflux disease)   . Glaucoma   . Headache(784.0)   . Hyperlipidemia    . Hypertension    sees Dr. Percival Spanish   . Sleep apnea    CPAP    Past Surgical History:  Procedure Laterality Date  . AMPUTATION Left 07/26/2015   Procedure: AMPUTATION LEFT FIFTH RAY;  Surgeon: [name], MD;  Location: [place];  Service: Orthopedics;  Laterality: Left;  . bilateral hip pins placed    . INCISION AND DRAINAGE PERIRECTAL ABSCESS Right 03/07/2016   Procedure: IRRIGATION AND DEBRIDEMENT PERIRECTAL ABSCESS;  Surgeon: Alphonsa Overall, MD;  Location: [facility];  Service: General;  Laterality: Right;  . INCISION AND DRAINAGE PERIRECTAL ABSCESS Right 03/09/2016   Procedure: EXAM UNDER ANESTHESIA, IRRIGATION AND DEBRIDEMENT PERIRECTAL ABSCESS;  Surgeon: Johnathan Hausen, MD;  Location: [facility];  Service: General;  Laterality: Right;  Open, betadine packed wound  . TOE AMPUTATION Left 11/19/2019   Removal of all toes from left foot  . 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/20/2019   Procedure: TRANSMETATARSAL AMPUTATION LEFT FOOT;  Surgeon: Wylene Simmer, MD;  Location: [place];  Service: Orthopedics;  Laterality: Left;     Current Outpatient Medications  Medication Sig Dispense Refill  . amLODipine (NORVASC) 10  MG tablet Take 1 tablet (10 mg total) by mouth daily. 30 tablet 5  . cloNIDine (CATAPRES - DOSED IN MG/24 HR) 0.1 mg/24hr patch APPLY 1 PATCH(0.1 MG) EXTERNALLY TO THE SKIN 1 TIME A WEEK (Patient taking differently: Place 0.1 mg onto the skin once a week. Sundays) 4 patch 3  . furosemide (LASIX) 20 MG tablet Take 1 tablet (20 mg total) by mouth daily. (Patient taking differently: Take 20 mg by mouth as needed for fluid (swelling).) 90 tablet 3  . glucose blood test strip Use Bayer Contour Next Test strips as instructed to check blood sugar 4 times daily. 150 each 3  . glucose blood test strip 1 each by Other route 4 (four) times daily.     . insulin aspart (NOVOLOG) 100 UNIT/ML injection Use maximum of 0.6 mL/day in insulin pump 40 mL 2  . Insulin Disposable Pump (OMNIPOD DASH 5 PACK  PODS) MISC Inject 1 each into the skin continuous. Apply 1 OmniPod insulin pump to body daily for insulin delivery. 30 each 12  . Insulin Pen Needle 31G X 5 MM MISC 1 each by Other route daily.     Marland Kitchen labetalol (NORMODYNE) 100 MG tablet Take 1 tablet (100 mg total) by mouth 2 (two) times daily. TAKE 1 TABLET BY MOUTH TWICE DAILY. (Patient taking differently: Take 100 mg by mouth 2 (two) times daily.) 180 tablet 3  . latanoprost (XALATAN) 0.005 % ophthalmic solution Place 1 drop into both eyes daily.    Marland Kitchen LYRICA 150 MG capsule Take 1 capsule (150 mg total) by mouth 3 (three) times daily. 90 capsule 5  . nortriptyline (PAMELOR) 10 MG capsule TAKE 4 CAPSULES BY MOUTH AT BEDTIME (Patient taking differently: Take 40 mg by mouth at bedtime.) 360 capsule 3  . pravastatin (PRAVACHOL) 40 MG tablet Take 1 tablet (40 mg total) by mouth every evening. (Patient taking differently: Take 40 mg by mouth daily.) 90 tablet 3   No current facility-administered medications for this visit.    Allergies:   Augmentin [amoxicillin-pot clavulanate] and Vancomycin   ROS:  Please see the history of present illness.   Otherwise, review of systems are positive for none.   All other systems are reviewed and negative.    PHYSICAL EXAM: VS:  BP (!) 150/73   Pulse 87   Ht 5\' 10"  (1.778 m)   Wt 271 lb 9.6 oz (123.2 kg)   SpO2 97%   BMI 38.97 kg/m  , BMI Body mass index is 38.97 kg/m.  GENERAL:  Well appearing NECK:  No jugular venous distention, waveform within normal limits, carotid upstroke brisk and symmetric, no bruits, no thyromegaly LUNGS:  Clear to auscultation bilaterally CHEST:  Unremarkable HEART:  PMI not displaced or sustained,S1 and S2 within normal limits, no S3, no S4, no clicks, no rubs, no murmurs ABD:  Flat, positive bowel sounds normal in frequency in pitch, no bruits, no rebound, no guarding, no midline pulsatile mass, no hepatomegaly, no splenomegaly EXT:  2 plus pulses throughout, no edema, no  cyanosis no clubbing, missing all of his toes on his left foot   EKG:  EKG is   ordered today. Sinus rhythm, rate 97, RAD, intervals within normal limits, no acute ST-T wave changes.  Recent Labs: 12/30/2019: ALT 13; BUN 13; Creat 1.15; Hemoglobin 14.0; Platelets 358; Potassium 4.2; Sodium 137    Lipid Panel    Component Value Date/Time   CHOL 136 11/21/2019 0457   TRIG 132 11/21/2019 0457  HDL 24 (L) 11/21/2019 0457   CHOLHDL 5.7 11/21/2019 0457   VLDL 26 11/21/2019 0457   LDLCALC 86 11/21/2019 0457   LDLDIRECT 85.0 06/05/2019 0832      Wt Readings from Last 3 Encounters:  03/04/20 271 lb 9.6 oz (123.2 kg)  02/04/20 266 lb 12.8 oz (121 kg)  02/02/20 268 lb 4 oz (121.7 kg)      Other studies Reviewed: Additional studies/ records that were reviewed today include:    Labs Review of the above records demonstrates: See elsewhere   ASSESSMENT AND PLAN:   CHEST PAIN:     His chest pain was somewhat atypical but he has significant cardiovascular risk factors.  I will screen him with a  Lexiscan Myoview.    HTN:   The blood pressure is at target.  No change in therapy.   DM: His blood sugar is being addressed actively.  His A1c is now down to 7.4 after having gotten higher than 11 recently.  DYSLIPIDEMIA:   His last LDL was 86 with an HDL of 24.  I will repeat this in the future and likely go up on his meds.   SLEEP APNEA:    He had a study and had a prescription for CPAP.    PULMONARY HTN:   There was no evidence of this on echo 2020.     Current medicines are reviewed at length with the patient today.  The patient does not have concerns regarding medicines.  The following changes have been made:   None  Labs/ tests ordered today include:   Orders Placed This Encounter  Procedures  . Cardiac Stress Test: Informed Consent Details: Physician/Practitioner Attestation; Transcribe to consent form and obtain patient signature  . MYOCARDIAL PERFUSION IMAGING      Disposition:   FU with me an APP in 2 months.    Signed, Minus Breeding, MD  03/04/2020 5:37 PM    [facility]

## 2020-03-03 NOTE — Telephone Encounter (Signed)
Spoke with patient about shortness of breath. Patient reports it came on last night while watching TV. It was accompanied by a throbbing chest pain that felt like gas and lots of sweating. Patient laid down to try to relieve the pain. He also took some alkaseltzer which helped relieve some of the pain. It lasted through the night but was gone this morning. No shortness of breath or chest pain at time of call. Patient advised if pain returns to call 911 or proceed to the emergency room immediately. Patient has a follow up appointment tomorrow 03/04/20. No vitals available as patient misplaced his blood pressure machine.

## 2020-03-03 NOTE — Telephone Encounter (Signed)
Per patient schedule message:  "Not feeling well some shortness of breathe"  I scheduled the patient for 03/04/20 at 4:20 PM and made him aware that I would route a message to clinical staff regarding symptom.

## 2020-03-04 ENCOUNTER — Encounter: Payer: Self-pay | Admitting: Cardiology

## 2020-03-04 ENCOUNTER — Other Ambulatory Visit: Payer: Self-pay

## 2020-03-04 ENCOUNTER — Ambulatory Visit (INDEPENDENT_AMBULATORY_CARE_PROVIDER_SITE_OTHER): Payer: Medicare Other | Admitting: Cardiology

## 2020-03-04 VITALS — BP 150/73 | HR 87 | Ht 70.0 in | Wt 271.6 lb

## 2020-03-04 DIAGNOSIS — R002 Palpitations: Secondary | ICD-10-CM

## 2020-03-04 DIAGNOSIS — I10 Essential (primary) hypertension: Secondary | ICD-10-CM

## 2020-03-04 DIAGNOSIS — R072 Precordial pain: Secondary | ICD-10-CM

## 2020-03-04 DIAGNOSIS — E785 Hyperlipidemia, unspecified: Secondary | ICD-10-CM | POA: Diagnosis not present

## 2020-03-04 DIAGNOSIS — G4730 Sleep apnea, unspecified: Secondary | ICD-10-CM | POA: Diagnosis not present

## 2020-03-04 NOTE — Patient Instructions (Signed)
Medication Instructions:  The current medical regimen is effective;  continue present plan and medications.  *If you need a refill on your cardiac medications before your next appointment, please call your pharmacy*   Testing/Procedures: Your physician has requested that you have a lexiscan myoview. A cardiac stress test is a cardiological test that measures the heart's ability to respond to external stress in a controlled clinical environment. The stress response is induced by intravenous pharmacological stimulation.    Follow-Up: At [facility], you and your health needs are our priority.  As part of our continuing mission to provide you with exceptional heart care, we have created designated Provider Care Teams.  These Care Teams include your primary Cardiologist (physician) and Advanced Practice Providers (APPs -  Physician Assistants and Nurse Practitioners) who all work together to provide you with the care you need, when you need it.  We recommend signing up for the patient portal called "MyChart".  Sign up information is provided on this After Visit Summary.  MyChart is used to connect with patients for Virtual Visits (Telemedicine).  Patients are able to view lab/test results, encounter notes, upcoming appointments, etc.  Non-urgent messages can be sent to your provider as well.   To learn more about what you can do with MyChart, go to [email or web address].    Your next appointment:   2 month(s)  The format for your next appointment:   In Person  Provider:   You may see Minus Breeding, MD or one of the following Advanced Practice Providers on your designated Care Team:    Rosaria Ferries, PA-C  Jory Sims, DNP, ANP

## 2020-03-05 NOTE — Addendum Note (Signed)
Addended by: Lubertha Sayres on: 03/05/2020 04:43 PM   Modules accepted: Orders

## 2020-03-12 ENCOUNTER — Ambulatory Visit (HOSPITAL_COMMUNITY)
Admission: RE | Admit: 2020-03-12 | Payer: Medicare Other | Source: Ambulatory Visit | Attending: Cardiology | Admitting: Cardiology

## 2020-03-12 DIAGNOSIS — Z89439 Acquired absence of unspecified foot: Secondary | ICD-10-CM | POA: Diagnosis not present

## 2020-03-12 DIAGNOSIS — L84 Corns and callosities: Secondary | ICD-10-CM | POA: Diagnosis not present

## 2020-03-22 DIAGNOSIS — H401131 Primary open-angle glaucoma, bilateral, mild stage: Secondary | ICD-10-CM | POA: Diagnosis not present

## 2020-03-24 ENCOUNTER — Telehealth (HOSPITAL_COMMUNITY): Payer: Self-pay | Admitting: *Deleted

## 2020-03-24 DIAGNOSIS — I10 Essential (primary) hypertension: Secondary | ICD-10-CM | POA: Diagnosis not present

## 2020-03-24 DIAGNOSIS — E1142 Type 2 diabetes mellitus with diabetic polyneuropathy: Secondary | ICD-10-CM | POA: Diagnosis not present

## 2020-03-24 DIAGNOSIS — E785 Hyperlipidemia, unspecified: Secondary | ICD-10-CM | POA: Diagnosis not present

## 2020-03-24 DIAGNOSIS — Z794 Long term (current) use of insulin: Secondary | ICD-10-CM | POA: Diagnosis not present

## 2020-03-24 DIAGNOSIS — E1165 Type 2 diabetes mellitus with hyperglycemia: Secondary | ICD-10-CM | POA: Diagnosis not present

## 2020-03-24 DIAGNOSIS — Z79899 Other long term (current) drug therapy: Secondary | ICD-10-CM | POA: Diagnosis not present

## 2020-03-24 DIAGNOSIS — Z9641 Presence of insulin pump (external) (internal): Secondary | ICD-10-CM | POA: Diagnosis not present

## 2020-03-24 NOTE — Telephone Encounter (Signed)
Close encounter 

## 2020-03-25 ENCOUNTER — Other Ambulatory Visit: Payer: Self-pay

## 2020-03-25 ENCOUNTER — Ambulatory Visit (HOSPITAL_COMMUNITY)
Admission: RE | Admit: 2020-03-25 | Discharge: 2020-03-25 | Disposition: A | Discharge status: Home / Self Care | Payer: Medicare Other | Source: Ambulatory Visit | Attending: Cardiovascular Disease | Admitting: Cardiovascular Disease

## 2020-03-25 DIAGNOSIS — R072 Precordial pain: Secondary | ICD-10-CM | POA: Diagnosis not present

## 2020-03-25 LAB — MYOCARDIAL PERFUSION IMAGING
LV dias vol: 125 mL (ref 62–150)
LV sys vol: 75 mL
Peak HR: 100 {beats}/min
Rest HR: 85 {beats}/min
SDS: 4
SRS: 2
SSS: 6
TID: 1.03

## 2020-03-25 MED ORDER — REGADENOSON 0.4 MG/5ML IV SOLN
0.4000 mg | Freq: Once | INTRAVENOUS | Status: AC
  Administered 2020-03-25: 0.4 mg via INTRAVENOUS

## 2020-03-25 MED ORDER — TECHNETIUM TC 99M TETROFOSMIN IV KIT
10.2000 | PACK | Freq: Once | INTRAVENOUS | Status: AC | PRN
  Administered 2020-03-25: 10.2 via INTRAVENOUS
  Filled 2020-03-25: qty 11

## 2020-03-25 MED ORDER — TECHNETIUM TC 99M TETROFOSMIN IV KIT
29.5000 | PACK | Freq: Once | INTRAVENOUS | Status: AC | PRN
  Administered 2020-03-25: 29.5 via INTRAVENOUS
  Filled 2020-03-25: qty 30

## 2020-03-29 ENCOUNTER — Other Ambulatory Visit: Payer: Self-pay | Admitting: *Deleted

## 2020-03-29 NOTE — Patient Outreach (Signed)
[facility]) Care Management  03/29/2020  Danny Fuller 1975/04/26 [id number]   Outreach attempt #4, unsuccessful, HIPAA compliant voice message left.  No response from member after multiple unsuccessful outreach attempts and letter sent.  Will close case at this time due to inability to maintain contact.  Will notify member and primary MD of case closure.  Valente David, [place], MSN [place] [phone]

## 2020-04-09 DIAGNOSIS — L84 Corns and callosities: Secondary | ICD-10-CM | POA: Diagnosis not present

## 2020-04-09 DIAGNOSIS — E1140 Type 2 diabetes mellitus with diabetic neuropathy, unspecified: Secondary | ICD-10-CM | POA: Diagnosis not present

## 2020-04-09 DIAGNOSIS — R260 Ataxic gait: Secondary | ICD-10-CM | POA: Diagnosis not present

## 2020-04-21 DIAGNOSIS — E1165 Type 2 diabetes mellitus with hyperglycemia: Secondary | ICD-10-CM | POA: Diagnosis not present

## 2020-04-21 DIAGNOSIS — Z9641 Presence of insulin pump (external) (internal): Secondary | ICD-10-CM | POA: Diagnosis not present

## 2020-04-21 DIAGNOSIS — Z794 Long term (current) use of insulin: Secondary | ICD-10-CM | POA: Diagnosis not present

## 2020-04-21 DIAGNOSIS — I10 Essential (primary) hypertension: Secondary | ICD-10-CM | POA: Diagnosis not present

## 2020-05-12 NOTE — Progress Notes (Deleted)
Cardiology Office Note   Date:  05/12/2020   ID:  Danny Fuller, DOB 28-Jun-1975, MRN [id number]  PCP:  [name], MD  Cardiologist: Dr. Percival Spanish No chief complaint on file.    History of Present Illness: Danny Fuller is a 45 y.o. male who presents for chest discomfort, with other history to include hypertension, diabetes, sleep apnea, and pulmonary hypertension found on echo in 2020.  Was last seen by Dr. Percival Spanish on 03/04/2020 at which time a Lexiscan stress Myoview was ordered.  This was completed on 03/25/2020 which was found to be a low risk study, with no ST segment deviation noted during stress, EF of 40%.  The patient was informed that this was a low risk study.  He is here for follow-up to discuss his current symptoms.    Past Medical History:  Diagnosis Date  . Asthma   . Back pain    Bulging Disk  . Chronic kidney disease    sees Dr. Jamal Maes    . Diabetes mellitus    sees Dr. Dwyane Dee   . Diabetic foot ulcers ([place])    sees Dr. Wylene Simmer   . GERD (gastroesophageal reflux disease)   . Glaucoma   . Headache(784.0)   . Hyperlipidemia   . Hypertension    sees Dr. Percival Spanish   . Sleep apnea    CPAP    Past Surgical History:  Procedure Laterality Date  . AMPUTATION Left 07/26/2015   Procedure: AMPUTATION LEFT FIFTH RAY;  Surgeon: [name], MD;  Location: [facility];  Service: Orthopedics;  Laterality: Left;  . bilateral hip pins placed    . INCISION AND DRAINAGE PERIRECTAL ABSCESS Right 03/07/2016   Procedure: IRRIGATION AND DEBRIDEMENT PERIRECTAL ABSCESS;  Surgeon: Alphonsa Overall, MD;  Location: [facility];  Service: General;  Laterality: Right;  . INCISION AND DRAINAGE PERIRECTAL ABSCESS Right 03/09/2016   Procedure: EXAM UNDER ANESTHESIA, IRRIGATION AND DEBRIDEMENT PERIRECTAL ABSCESS;  Surgeon: Johnathan Hausen, MD;  Location: [facility];  Service: General;  Laterality: Right;  Open, betadine packed wound  . TOE AMPUTATION Left 11/19/2019   Removal of all toes from  left foot  . 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/20/2019   Procedure: TRANSMETATARSAL AMPUTATION LEFT FOOT;  Surgeon: Wylene Simmer, MD;  Location: [facility];  Service: Orthopedics;  Laterality: Left;     Current Outpatient Medications  Medication Sig Dispense Refill  . amLODipine (NORVASC) 10 MG tablet Take 1 tablet (10 mg total) by mouth daily. 30 tablet 5  . cloNIDine (CATAPRES - DOSED IN MG/24 HR) 0.1 mg/24hr patch APPLY 1 PATCH(0.1 MG) EXTERNALLY TO THE SKIN 1 TIME A WEEK (Patient taking differently: Place 0.1 mg onto the skin once a week. Sundays) 4 patch 3  . furosemide (LASIX) 20 MG tablet Take 1 tablet (20 mg total) by mouth daily. (Patient taking differently: Take 20 mg by mouth as needed for fluid (swelling).) 90 tablet 3  . glucose blood test strip Use Bayer Contour Next Test strips as instructed to check blood sugar 4 times daily. 150 each 3  . glucose blood test strip 1 each by Other route 4 (four) times daily.     . insulin aspart (NOVOLOG) 100 UNIT/ML injection Use maximum of 0.6 mL/day in insulin pump 40 mL 2  . Insulin Disposable Pump (OMNIPOD DASH 5 PACK PODS) MISC Inject 1 each into the skin continuous. Apply 1 OmniPod insulin pump to body daily for insulin delivery. 30 each 12  . Insulin Pen Needle  31G X 5 MM MISC 1 each by Other route daily.     Marland Kitchen labetalol (NORMODYNE) 100 MG tablet Take 1 tablet (100 mg total) by mouth 2 (two) times daily. TAKE 1 TABLET BY MOUTH TWICE DAILY. (Patient taking differently: Take 100 mg by mouth 2 (two) times daily.) 180 tablet 3  . latanoprost (XALATAN) 0.005 % ophthalmic solution Place 1 drop into both eyes daily.    Marland Kitchen LYRICA 150 MG capsule Take 1 capsule (150 mg total) by mouth 3 (three) times daily. 90 capsule 5  . nortriptyline (PAMELOR) 10 MG capsule TAKE 4 CAPSULES BY MOUTH AT BEDTIME (Patient taking differently: Take 40 mg by mouth at bedtime.) 360 capsule 3  . pravastatin (PRAVACHOL) 40 MG tablet Take 1 tablet (40 mg total) by mouth every  evening. (Patient taking differently: Take 40 mg by mouth daily.) 90 tablet 3   No current facility-administered medications for this visit.    Allergies:   Augmentin [amoxicillin-pot clavulanate] and Vancomycin    Social History:  The patient  reports that he quit smoking about 4 years ago. His smoking use included cigarettes. He has a 10.00 pack-year smoking history. He has never used smokeless tobacco. He reports that he does not drink alcohol and does not use drugs.   Family History:  The patient's family history includes Arthritis in an other family member; Diabetes in his brother, mother, sister, and another family member; Heart attack in his father; Hyperlipidemia in an other family member; Hypertension in an other family member; Stroke in an other family member; Sudden death in an other family member.    ROS: All other systems are reviewed and negative. Unless otherwise mentioned in H&P    PHYSICAL EXAM: VS:  There were no vitals taken for this visit. , BMI There is no height or weight on file to calculate BMI. GEN: Well nourished, well developed, in no acute distress HEENT: normal Neck: no JVD, carotid bruits, or masses Cardiac: ***RRR; no murmurs, rubs, or gallops,no edema  Respiratory:  Clear to auscultation bilaterally, normal work of breathing GI: soft, nontender, nondistended, + BS MS: no deformity or atrophy Skin: warm and dry, no rash Neuro:  Strength and sensation are intact Psych: euthymic mood, full affect   EKG:  EKG {ACTION; IS/IS QMV:[id number]} ordered today. The ekg ordered today demonstrates ***   Recent Labs: 12/30/2019: ALT 13; BUN 13; Creat 1.15; Hemoglobin 14.0; Platelets 358; Potassium 4.2; Sodium 137    Lipid Panel    Component Value Date/Time   CHOL 136 11/21/2019 0457   TRIG 132 11/21/2019 0457   HDL 24 (L) 11/21/2019 0457   CHOLHDL 5.7 11/21/2019 0457   VLDL 26 11/21/2019 0457   LDLCALC 86 11/21/2019 0457   LDLDIRECT 85.0 06/05/2019 0832       Wt Readings from Last 3 Encounters:  03/25/20 271 lb (122.9 kg)  03/04/20 271 lb 9.6 oz (123.2 kg)  02/04/20 266 lb 12.8 oz (121 kg)      Other studies Reviewed: NM Stress Test -Lexiscan 03/25/2020 Study Highlights    The left ventricular ejection fraction is moderately decreased (30-44%).  Nuclear stress EF: 40%.  There was no ST segment deviation noted during stress.  The study is normal.  This is a low risk study.   Normal pharmacologic nuclear stress test with no evidence for ischemia. LVEF 40% can be underestimated on nuclear study, correlation with an echocardiogram is recommended.  Echocardiogram 04/08/2020  1. The left ventricle has normal systolic function  with an ejection  fraction of 60-65%. The cavity size was normal. There is moderately  increased left ventricular wall thickness. Left ventricular diastolic  Doppler parameters are consistent with impaired  relaxation.  2. The right ventricle has normal systolic function. The cavity was  normal. There is no increase in right ventricular wall thickness.  3. The mitral valve is normal in structure.  4. The tricuspid valve is normal in structure.  5. The aortic valve is tricuspid.  6. The pulmonic valve was normal in structure.  7. The aortic root is normal in size and structure.  8. Normal LV function; moderate LVH; mild diastolic dysfunction; mild TR  with mild pulmonary hypertension.    ASSESSMENT AND PLAN:  1.  ***   Current medicines are reviewed at length with the patient today.  I have spent *** dedicated to the care of this patient on the date of this encounter to include pre-visit review of records, assessment, management and diagnostic testing,with shared decision making.  Labs/ tests ordered today include: *** Phill Myron. West Pugh, ANP, AACC   05/12/2020 [address] [place] Office [phone] Fax [phone]  Notice: This dictation was prepared with Dragon dictation along with smaller phrase technology. Any transcriptional errors that result from this process are unintentional and may not be corrected upon review.

## 2020-05-14 ENCOUNTER — Ambulatory Visit: Payer: Medicare Other | Admitting: Adult Health

## 2020-05-26 ENCOUNTER — Other Ambulatory Visit: Payer: Self-pay | Admitting: Neurology

## 2020-05-31 ENCOUNTER — Ambulatory Visit (INDEPENDENT_AMBULATORY_CARE_PROVIDER_SITE_OTHER): Payer: Medicare Other | Admitting: Neurology

## 2020-05-31 ENCOUNTER — Other Ambulatory Visit: Payer: Self-pay

## 2020-05-31 ENCOUNTER — Encounter: Payer: Self-pay | Admitting: Neurology

## 2020-05-31 VITALS — BP 178/118 | HR 99 | Ht 71.0 in | Wt 270.0 lb

## 2020-05-31 DIAGNOSIS — E1142 Type 2 diabetes mellitus with diabetic polyneuropathy: Secondary | ICD-10-CM | POA: Diagnosis not present

## 2020-05-31 DIAGNOSIS — G5622 Lesion of ulnar nerve, left upper limb: Secondary | ICD-10-CM | POA: Diagnosis not present

## 2020-05-31 DIAGNOSIS — I10 Essential (primary) hypertension: Secondary | ICD-10-CM | POA: Diagnosis not present

## 2020-05-31 NOTE — Progress Notes (Signed)
Follow-up Visit   Date: 05/31/20    Danny Fuller MRN: [id number] DOB: January 16, 1976   Interim History: Danny Fuller is a 45 y.o. 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  The patient was accompanied to the clinic by self.  His neuropathy is stable.  He continues to have diffuse numbness in the legs and hands.  He is compliant with Lyrica 150mg  TID and nortriptyline 40mg  at bedtime which mostly controls his pain.  He feels that since his amputation, his sensation in the sole of the left foot is somewhat better.  His balance is fair.  He has one fall after missing a step.  No injuries.  He walks unassisted.  He started Trulicity in late 9675 which has significantly improved his HbA1c which is down from 11 > 7.0. His insulin is also being tapered.  His bigger issue is his blood pressure which remains very high.  He is on sodium-free diet and on 4 anti-hypertensive medications.   Medications:  Current Outpatient Medications on File Prior to Visit  Medication Sig Dispense Refill  . amLODipine (NORVASC) 10 MG tablet Take 1 tablet (10 mg total) by mouth daily. 30 tablet 5  . cloNIDine (CATAPRES - DOSED IN MG/24 HR) 0.1 mg/24hr patch APPLY 1 PATCH(0.1 MG) EXTERNALLY TO THE SKIN 1 TIME A WEEK (Patient taking differently: Place 0.1 mg onto the skin once a week. Sundays) 4 patch 3  . furosemide (LASIX) 20 MG tablet Take 1 tablet (20 mg total) by mouth daily. (Patient taking differently: Take 20 mg by mouth as needed for fluid (swelling).) 90 tablet 3  . glucose blood test strip Use Bayer Contour Next Test strips as instructed to check blood sugar 4 times daily. 150 each 3  . glucose blood test strip 1 each by Other route 4 (four) times daily.     . insulin aspart (NOVOLOG) 100 UNIT/ML injection Use maximum of 0.6 mL/day in insulin pump 40 mL 2  .  Insulin Disposable Pump (OMNIPOD DASH 5 PACK PODS) MISC Inject 1 each into the skin continuous. Apply 1 OmniPod insulin pump to body daily for insulin delivery. 30 each 12  . Insulin Pen Needle 31G X 5 MM MISC 1 each by Other route daily.     Marland Kitchen labetalol (NORMODYNE) 100 MG tablet Take 1 tablet (100 mg total) by mouth 2 (two) times daily. TAKE 1 TABLET BY MOUTH TWICE DAILY. (Patient taking differently: Take 100 mg by mouth 2 (two) times daily.) 180 tablet 3  . latanoprost (XALATAN) 0.005 % ophthalmic solution Place 1 drop into both eyes daily.    Marland Kitchen LYRICA 150 MG capsule Take 1 capsule (150 mg total) by mouth 3 (three) times daily. 90 capsule 5  . nortriptyline (PAMELOR) 10 MG capsule TAKE 4 CAPSULES BY MOUTH AT BEDTIME 360 capsule 3  . pravastatin (PRAVACHOL) 40 MG tablet Take 1 tablet (40 mg total) by mouth every evening. (Patient taking differently: Take 40 mg by mouth daily.) 90 tablet 3   No current facility-administered medications on file prior to visit.    Allergies:  Allergies  Allergen Reactions  . Augmentin [Amoxicillin-Pot Clavulanate] Itching and Other (See Comments)    Dizziness  . Vancomycin Other (See Comments)    AKI    Vital Signs:  BP (!) 204/134   Pulse 99   Ht 5\' 11"  (1.803 m)  Wt 270 lb (122.5 kg)   SpO2 100%   BMI 37.66 kg/m   Neurological Exam: MENTAL STATUS including orientation to time, place, person, recent and remote memory, attention span and concentration, language, and fund of knowledge is normal.  Speech is not dysarthric.  CRANIAL NERVES:  Normal conjugate, extra-ocular eye movements in all directions of gaze.  No ptosis.  MOTOR:  Motor strength is 5/5 in all extremities, except left finger abductors 4/5, right toe extensors and flexors 4/5. s/p left mid-foot amputation  MSRs:  Reflexes are 2+/4 in the upper extremities, 1+ at the left patella, and absent R patella and ankles  SENSORY:  Vibration is absent below the ankles  COORDINATION/GAIT:   Gait appears mildly wide-based, stable  Data: NCS/EMG of the right side 12/14/2016:   1. 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  Overall, these findings are severe in degree electrically. 2. 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  NCS/EMG of the upper extremities 07/31/2017:  The electrophysiologic findings are most suggestive of a sensorimotor polyradiculoneuropathy affecting the upper extremities, moderate in degree electrically, which is new when compared to his previous study dated 12/31/2016. Alternatively, progression of sensorimotor demyelinating and axonal polyneuropathy with a superimposed ulnar neuropathy across the elbow (worse on the left), cannot be excluded. Correlate clinically.  MRI lumbar spine wo contrast 08/22/2017: 1. 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 The left foramen also appears mildly narrowed. 2. Mild left foraminal narrowing at L5-S1 without definite nerve root encroachment. 3. No other significant acquired disc space findings. Congenitally short pedicles.  Labs 04/21/2020:  HbA1c 11 > 7.0  Lab Results  Component Value Date   CREATININE 1.15 12/30/2019   BUN 13 12/30/2019   NA 137 12/30/2019   K 4.2 12/30/2019   CL 105 12/30/2019   CO2 23 12/30/2019    IMPRESSION/PLAN: 1. 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  He has been able to reduce insulin.  Neuropathy is stable.  - Continue Lyrica 150mg  TID  - Continue nortriptyline 40mg  at bedtime  - Patient educated on daily foot inspection, fall prevention, and safety precautions around  the home.   2. Left ulnar neuropathy at the elbow, worsening  - NCS/EMG of the left arm  -Strategies to minimize nerve compression discussed  3. Resistant hypertension, asymptomatic  - BP initially 204/143, rechecked 178/118. He is monitoring this at home and is scheduled to see his PCP on Wednesday Return to clinic in 6 months   Thank you for allowing me to participate in patient's care.  If I can answer any additional questions, I would be pleased to do so.    Sincerely,    Miroslava Santellan K. Posey Pronto, DO

## 2020-05-31 NOTE — Patient Instructions (Signed)
Nerve testing of the left arm.  Do not apply oil or lotion to your left arm or hand on the day of testing.  Continue medications as you are taking.   Avoid leaning on your elbow or bending it  Return to clinic in 6 months

## 2020-06-02 ENCOUNTER — Encounter: Payer: Self-pay | Admitting: Family Medicine

## 2020-06-02 ENCOUNTER — Other Ambulatory Visit: Payer: Self-pay

## 2020-06-02 ENCOUNTER — Ambulatory Visit (INDEPENDENT_AMBULATORY_CARE_PROVIDER_SITE_OTHER): Payer: Medicare Other | Admitting: Family Medicine

## 2020-06-02 VITALS — BP 158/92 | HR 89 | Temp 98.2°F | Wt 267.0 lb

## 2020-06-02 DIAGNOSIS — I10 Essential (primary) hypertension: Secondary | ICD-10-CM | POA: Diagnosis not present

## 2020-06-02 MED ORDER — CLONIDINE 0.2 MG/24HR TD PTWK: 0.2000 mg | MEDICATED_PATCH | TRANSDERMAL | 12 refills | Status: DC

## 2020-06-02 NOTE — Progress Notes (Signed)
   Subjective:    Patient ID: ARLIS YALE, male    DOB: [birth year], 45 y.o.   MRN: [id number]  HPI Here for elevated BP readings the past month or two. He has gotten readings as high as  214/120, with most of them around 180/110. He feels fine. No chest pain or SOB or headaches. His weight is stable. His diabetes is well controlled, and his A1c a few weeks ago was 7.0.   Review of Systems  Constitutional: Negative.   Respiratory: Negative.   Cardiovascular: Negative.        Objective:   Physical Exam Constitutional:      Appearance: Normal appearance.  Cardiovascular:     Rate and Rhythm: Normal rate and regular rhythm.     Pulses: Normal pulses.     Heart sounds: Normal heart sounds.  Pulmonary:     Effort: Pulmonary effort is normal.     Breath sounds: Normal breath sounds.  Musculoskeletal:     Comments: 1+ edema in the left ankle and no edema in the right ankle   Neurological:     Mental Status: He is alert.           Assessment & Plan:  HTN, we will increase the Clonidine patches to 0.2 mg/24 hours once a week. Recheck in 3-4 weeks.  [name], MD

## 2020-06-25 ENCOUNTER — Other Ambulatory Visit: Payer: Self-pay | Admitting: Endocrinology

## 2020-07-02 ENCOUNTER — Encounter: Payer: Self-pay | Admitting: Family Medicine

## 2020-07-02 ENCOUNTER — Other Ambulatory Visit: Payer: Self-pay

## 2020-07-02 ENCOUNTER — Ambulatory Visit (INDEPENDENT_AMBULATORY_CARE_PROVIDER_SITE_OTHER): Payer: Medicare Other | Admitting: Family Medicine

## 2020-07-02 VITALS — BP 150/108 | HR 98 | Temp 98.0°F | Wt 265.0 lb

## 2020-07-02 DIAGNOSIS — I10 Essential (primary) hypertension: Secondary | ICD-10-CM | POA: Diagnosis not present

## 2020-07-02 MED ORDER — AMLODIPINE BESY-BENAZEPRIL HCL 10-40 MG PO CAPS: 1.0000 | ORAL_CAPSULE | Freq: Every day | ORAL | 0 refills | Status: DC

## 2020-07-02 NOTE — Progress Notes (Signed)
   Subjective:    Patient ID: Danny Fuller, male    DOB: 1975/12/09, 45 y.o.   MRN: [id number]  HPI Here to follow up on HTN. He feels fine. At our last visit we increase the Clonidine patch to 0.2 mg daily. His BP at home averages 150/90 now.    Review of Systems  Constitutional: Negative.   Respiratory: Negative.   Cardiovascular: Negative.        Objective:   Physical Exam Constitutional:      Appearance: Normal appearance.  Cardiovascular:     Rate and Rhythm: Normal rate and regular rhythm.     Pulses: Normal pulses.     Heart sounds: Normal heart sounds.  Pulmonary:     Effort: Pulmonary effort is normal.     Breath sounds: Normal breath sounds.  Neurological:     Mental Status: He is alert.           Assessment & Plan:  HTN. We will switch back from Amlodipine to Amlodipine-Benazepril 10-40 daily. He will report back in 3-4 weeks.  [name], MD

## 2020-07-27 ENCOUNTER — Other Ambulatory Visit: Payer: Self-pay

## 2020-07-27 ENCOUNTER — Ambulatory Visit (INDEPENDENT_AMBULATORY_CARE_PROVIDER_SITE_OTHER): Payer: Medicare Other | Admitting: Neurology

## 2020-07-27 DIAGNOSIS — E1142 Type 2 diabetes mellitus with diabetic polyneuropathy: Secondary | ICD-10-CM

## 2020-07-27 DIAGNOSIS — G5622 Lesion of ulnar nerve, left upper limb: Secondary | ICD-10-CM

## 2020-07-27 NOTE — Progress Notes (Signed)
Follow-up Visit   Date: 07/27/20    Danny Fuller MRN: [id number] DOB: 12/30/1975   Interim History: Danny Fuller is a 45 y.o. left-handed African American male with diabetes mellitus complicated by left mid-foot amputation (2021), GERD, and hypertension returning to the clinic for follow-up of diabetic neuropathy and left hand numbness/tingling.  The patient was accompanied to the clinic by self.  He is complaining of worsening weakness, numbness, and tingling in the left hand.  It is his dominant hand, so concerned about progressive symptoms.  Overall, his neuropathy in the feet is stable and controlled on Lyrica and nortriptyline.  Diabetes continues to be well managed.   Medications:  Current Outpatient Medications on File Prior to Visit  Medication Sig Dispense Refill   amLODipine-benazepril (LOTREL) 10-40 MG capsule Take 1 capsule by mouth daily. 1 capsule 0   cloNIDine (CATAPRES - DOSED IN MG/24 HR) 0.2 mg/24hr patch Place 1 patch (0.2 mg total) onto the skin once a week. 4 patch 12   furosemide (LASIX) 20 MG tablet Take 1 tablet (20 mg total) by mouth daily. (Patient taking differently: Take 20 mg by mouth as needed for fluid (swelling).) 90 tablet 3   glucose blood test strip Use Bayer Contour Next Test strips as instructed to check blood sugar 4 times daily. 150 each 3   glucose blood test strip 1 each by Other route 4 (four) times daily.      insulin aspart (NOVOLOG) 100 UNIT/ML injection Use maximum of 0.6 mL/day in insulin pump 40 mL 2   Insulin Disposable Pump (OMNIPOD DASH PODS, GEN 4,) MISC INJECT 1 EACH INTO THE SKIN CONTINUOUS. APPLY 1 OMNIPOD INSULIN PUMP TO BODY DAILY FOR INSULIN 30 each 1   Insulin Pen Needle 31G X 5 MM MISC 1 each by Other route daily.      labetalol (NORMODYNE) 100 MG tablet Take 1 tablet (100 mg total) by mouth 2 (two) times daily. TAKE 1 TABLET BY MOUTH TWICE DAILY. (Patient taking differently: Take 100 mg by mouth 2 (two) times daily.)  180 tablet 3   latanoprost (XALATAN) 0.005 % ophthalmic solution Place 1 drop into both eyes daily.     LYRICA 150 MG capsule Take 1 capsule (150 mg total) by mouth 3 (three) times daily. 90 capsule 5   nortriptyline (PAMELOR) 10 MG capsule TAKE 4 CAPSULES BY MOUTH AT BEDTIME 360 capsule 3   pravastatin (PRAVACHOL) 40 MG tablet Take 1 tablet (40 mg total) by mouth every evening. (Patient taking differently: Take 40 mg by mouth daily.) 90 tablet 3   No current facility-administered medications on file prior to visit.    Allergies:  Allergies  Allergen Reactions   Augmentin [Amoxicillin-Pot Clavulanate] Itching and Other (See Comments)    Dizziness   Vancomycin Other (See Comments)    AKI    Vital Signs:  There were no vitals taken for this visit.  Neurological Exam: MENTAL STATUS including orientation to time, place, person, recent and remote memory, attention span and concentration, language, and fund of knowledge is normal.  Speech is not dysarthric.  CRANIAL NERVES:  Normal conjugate, extra-ocular eye movements in all directions of gaze.  No ptosis.  MOTOR:  Motor strength is 5/5 in all extremities, except left finger abductors 4/5, right toe extensors and flexors 4/5. s/p left mid-foot amputation  COORDINATION/GAIT:  Gait appears mildly wide-based, stable  Data: NCS/EMG of the right side 12/14/2016:   The electrophysiologic findings are most consistent with  a length dependent sensorimotor polyneuropathy, axon loss and demyelinating in type, affecting the right side.  Overall, these findings are severe in degree electrically. 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  NCS/EMG of the upper extremities 07/31/2017:  The electrophysiologic findings are most suggestive of a sensorimotor polyradiculoneuropathy affecting the upper extremities, moderate in degree electrically, which is new when compared to his previous study dated  12/31/2016. Alternatively, progression of sensorimotor demyelinating and axonal polyneuropathy with a superimposed ulnar neuropathy across the elbow (worse on the left), cannot be excluded. Correlate clinically.  MRI lumbar spine wo contrast 08/22/2017: 1. 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 The left foramen also appears mildly narrowed. 2. Mild left foraminal narrowing at L5-S1 without definite nerve root encroachment. 3. No other significant acquired disc space findings. Congenitally short pedicles.  Labs 04/21/2020:  HbA1c 11 > 7.0  NCS/EMG of the left arm 07/27/2020: Left ulnar neuropathy with slowing across the elbow, with demyelinating and axonal features.  Overall, these findings are severe in degree electrically with interval progression as compared to prior study on 07/31/2017. Abnormal radial sensory responses are due to known history of diabetic polyradiculoneuropathy.  IMPRESSION/PLAN: 1. Left ulnar neuropathy across the elbow, worsening hand paresthesias and weakness.  Results of EMG discussed which shows severe ulnar neuropathy, progressed from prior study in 2019.  His diabetic neuropathy is not involving his left arm very much, only his radial sensory response is abnormal, otherwise median sensory and motor responses are normal.  - Referral to Dr. Amedeo Plenty for ulnar decompression  2. 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  He has been able to reduce insulin.  Neuropathy is stable.  - Continue Lyrica 150mg  TID  - Continue nortriptyline 40mg  at bedtime    Thank you for allowing me to participate in patient's care.  If I can answer any additional questions, I would be pleased to do so.    Sincerely,    Synda Bagent K.  Posey Pronto, DO

## 2020-07-27 NOTE — Procedures (Signed)
[facility]  [place], [place]  [place], [place] [address] Tel: [phone] Fax:  [phone] Test Date:  07/27/2020  Patient: Danny Fuller DOB: [birth year] Physician: Narda Amber, DO  Sex: Male Height: 5\' 11"  Ref Phys: Narda Amber, DO  ID#: [id number]   Technician:    Patient Complaints: Is a 45 year old man with known diabetic polyradiculoneuropathy referred for evaluation of worsening left hand numbness and tingling.  NCV & EMG Findings: Extensive electrodiagnostic testing of the left upper extremity shows:  Left median sensory responses within normal limits.  Left  and radial sensory responses show prolonged latency (L3.2, L3.2 ms) and reduced amplitude (L5.2, L12.5 V).   Left median motor responses within normal limits.  Left ulnar motor response shows prolonged latency (L3.8, L4.4 ms), reduced amplitude (L2.8, L1.8 mV), and decreased conduction velocity (A Elbow-B Elbow, L24, L37 m/s).   Chronic motor axonal loss changes are seen affecting the ulnar innervated muscles on the left, with active denervation in the first dorsal interosseous muscle  Impression: Left ulnar neuropathy with slowing across the elbow, with demyelinating and axonal features.  Overall, these findings are severe in degree electrically with interval progression as compared to prior study on 07/31/2017. Abnormal radial sensory responses are due to known history of diabetic polyradiculoneuropathy.   ___________________________ Narda Amber, DO    Nerve Conduction Studies Anti Sensory Summary Table   Stim Site NR Peak (ms) Norm Peak (ms) P-T Amp (V) Norm P-T Amp  Left Median Anti Sensory (2nd Digit)  33C  Wrist    3.2 <3.4 24.6 >20  Left Radial Anti Sensory (Base 1st Digit)  33C  Wrist    3.2 <2.7 12.5 >18  Left Ulnar Anti Sensory (5th Digit)  33C  Wrist    3.2 <3.1 5.2 >12   Motor Summary Table   Stim Site NR Onset (ms) Norm Onset (ms) O-P Amp (mV) Norm O-P Amp Site1 Site2  Delta-0 (ms) Dist (cm) Vel (m/s) Norm Vel (m/s)  Left Median Motor (Abd Poll Brev)  33C  Wrist    3.7 <3.9 12.1 >6 Elbow Wrist 6.5 33.0 51 >50  Elbow    10.2  11.6         Left Ulnar Motor (Abd Dig Minimi)  33C  Wrist    3.8 <3.1 2.8 >7 B Elbow Wrist 5.1 27.0 53 >50  B Elbow    8.9  2.4  A Elbow B Elbow 2.7 10.0 37 >50  A Elbow    11.6  2.1         Left Ulnar (FDI) Motor (1st DI)  33C  Wrist    4.4 <4.3 1.8 >7 B Elbow Wrist 5.4 28.0 52 >50  B Elbow    9.7  0.9  A Elbow B Elbow 4.1 10.0 24 >50  A Elbow    13.8  0.7          EMG   Side Muscle Ins Act Fibs Psw Fasc Number Recrt Dur Dur. Amp Amp. Poly Poly. Comment  Left 1stDorInt Nml 1+ Nml Nml SMU Rapid All 1+ All 1+ All 1+ N/A  Left Abd Poll Brev Nml Nml Nml Nml Nml Nml Nml Nml Nml Nml Nml Nml N/A  Left Ext Indicis Nml Nml Nml Nml Nml Nml Nml Nml Nml Nml Nml Nml N/A  Left PronatorTeres Nml Nml Nml Nml Nml Nml Nml Nml Nml Nml Nml Nml N/A  Left Biceps Nml Nml Nml Nml Nml Nml Nml Nml Nml Nml  Nml Nml N/A  Left Triceps Nml Nml Nml Nml Nml Nml Nml Nml Nml Nml Nml Nml N/A  Left Deltoid Nml Nml Nml Nml Nml Nml Nml Nml Nml Nml Nml Nml N/A  Left ABD Dig Min Nml Nml Nml Nml SMU Rapid All 1+ All 1+ All 1+ N/A  Left FlexCarpiUln Nml Nml Nml Nml 2- Rapid Some 1+ Some 1+ Some 1+ N/A      Waveforms:

## 2020-07-28 NOTE — Addendum Note (Signed)
Addended by: Armen Pickup A on: 07/28/2020 02:47 PM   Modules accepted: Orders

## 2020-08-03 DIAGNOSIS — Z89432 Acquired absence of left foot: Secondary | ICD-10-CM | POA: Diagnosis not present

## 2020-08-03 DIAGNOSIS — E1142 Type 2 diabetes mellitus with diabetic polyneuropathy: Secondary | ICD-10-CM | POA: Diagnosis not present

## 2020-08-03 DIAGNOSIS — E785 Hyperlipidemia, unspecified: Secondary | ICD-10-CM | POA: Diagnosis not present

## 2020-08-03 DIAGNOSIS — Z9641 Presence of insulin pump (external) (internal): Secondary | ICD-10-CM | POA: Diagnosis not present

## 2020-08-03 DIAGNOSIS — E1165 Type 2 diabetes mellitus with hyperglycemia: Secondary | ICD-10-CM | POA: Diagnosis not present

## 2020-08-03 DIAGNOSIS — I10 Essential (primary) hypertension: Secondary | ICD-10-CM | POA: Diagnosis not present

## 2020-08-03 DIAGNOSIS — Z794 Long term (current) use of insulin: Secondary | ICD-10-CM | POA: Diagnosis not present

## 2020-08-11 DIAGNOSIS — E1140 Type 2 diabetes mellitus with diabetic neuropathy, unspecified: Secondary | ICD-10-CM | POA: Diagnosis not present

## 2020-08-11 DIAGNOSIS — L84 Corns and callosities: Secondary | ICD-10-CM | POA: Diagnosis not present

## 2020-09-10 ENCOUNTER — Other Ambulatory Visit: Payer: Self-pay

## 2020-09-10 ENCOUNTER — Encounter: Payer: Self-pay | Admitting: Family Medicine

## 2020-09-10 DIAGNOSIS — G5622 Lesion of ulnar nerve, left upper limb: Secondary | ICD-10-CM | POA: Diagnosis not present

## 2020-09-10 MED ORDER — TRAMADOL HCL 50 MG PO TABS: 100.0000 mg | ORAL_TABLET | Freq: Four times a day (QID) | ORAL | 2 refills | Status: DC | PRN

## 2020-09-10 NOTE — Progress Notes (Signed)
Requested medication currently not on med list

## 2020-09-20 DIAGNOSIS — E113593 Type 2 diabetes mellitus with proliferative diabetic retinopathy without macular edema, bilateral: Secondary | ICD-10-CM | POA: Diagnosis not present

## 2020-09-23 ENCOUNTER — Other Ambulatory Visit: Payer: Self-pay | Admitting: Endocrinology

## 2020-09-24 ENCOUNTER — Other Ambulatory Visit: Payer: Self-pay | Admitting: Endocrinology

## 2020-10-04 DIAGNOSIS — G5622 Lesion of ulnar nerve, left upper limb: Secondary | ICD-10-CM | POA: Diagnosis not present

## 2020-10-10 ENCOUNTER — Ambulatory Visit (HOSPITAL_COMMUNITY)
Admission: EM | Admit: 2020-10-10 | Discharge: 2020-10-10 | Disposition: A | Discharge status: Home / Self Care | Payer: Medicare Other

## 2020-10-10 ENCOUNTER — Encounter (HOSPITAL_COMMUNITY): Payer: Self-pay | Admitting: Emergency Medicine

## 2020-10-10 DIAGNOSIS — Z5189 Encounter for other specified aftercare: Secondary | ICD-10-CM

## 2020-10-10 DIAGNOSIS — G8918 Other acute postprocedural pain: Secondary | ICD-10-CM | POA: Diagnosis not present

## 2020-10-10 NOTE — Discharge Instructions (Addendum)
We have taken off the wet splint and placed a new splint.  Call your surgeon first thing tomorrow morning to update them and see if they have further recommendations.

## 2020-10-10 NOTE — Progress Notes (Signed)
Orthopedic Tech Progress Note Patient Details:  Danny Fuller Mar 29, 1975 [id number]  Removed long arm post-op splint as pt had gotten it wet in the shower. Reapplied long arm splint after incision was checked.  Ortho Devices Type of Ortho Device: Long arm splint Ortho Device/Splint Location: LUE Ortho Device/Splint Interventions: Ordered, Application, Adjustment, Removal   Post Interventions Patient Tolerated: Well Instructions Provided: Care of device, Adjustment of device  [name] Jeri Modena 10/10/2020, 3:27 PM

## 2020-10-10 NOTE — ED Provider Notes (Signed)
[facility]    CSN: [id number] Arrival date & time: 10/10/20  1057      History   Chief Complaint Chief Complaint  Patient presents with   Post-op Problem    HPI Danny Fuller is a 45 y.o. male.   Patient presenting today with left arm burning sensation and pain that started about 12 hours ago after he accidentally got some shower water into his cast.  He states he had an ulnar nerve procedure done 6 days ago which had been healing very well without complication up until this point.  He was able to have weaned himself off of his hydrocodone prior to worsening pain over the last 12 hours.  He denies any hand swelling, numbness, tingling, discoloration, fevers, chills, body aches.  Past Medical History:  Diagnosis Date   Asthma    Back pain    Bulging Disk   Chronic kidney disease    sees Dr. Jamal Maes     Diabetes mellitus    sees Dr. Dwyane Dee    Diabetic foot ulcers [facility])    sees Dr. Wylene Simmer    GERD (gastroesophageal reflux disease)    Glaucoma    Headache(784.0)    Hyperlipidemia    Hypertension    sees Dr. Percival Spanish    Sleep apnea    CPAP   Patient Active Problem List   Diagnosis Date Noted   Osteomyelitis of ankle and foot ([place]) 11/20/2019   Ulcer of left foot due to type 2 diabetes mellitus ([place]) 10/24/2019   Educated about COVID-19 virus infection 06/23/2019   Diabetic peripheral neuropathy ([place]) 12/16/2018   Pain due to onychomycosis of toenails of both feet 08/07/2018   Pre-ulcerative calluses 08/07/2018   Acquired absence of left foot ([place]) 08/07/2018   Hypotension 04/04/2018   Leukocytosis 04/04/2018   Dyslipidemia 04/01/2018   Pulmonary hypertension ([place]) 04/01/2018   Chronic low back pain with bilateral sciatica 07/24/2017   Hyperlipidemia 03/02/2017   Chest pain 03/02/2017   Acute renal failure superimposed on stage 2 chronic kidney disease ([place]) 02/07/2017   OSA (obstructive sleep apnea) 01/29/2017   Insomnia 01/29/2017   PVD  (peripheral vascular disease) ([place]) 11/28/2016   Palpitation 11/28/2016   Erectile dysfunction associated with type 2 diabetes mellitus ([place]) 05/08/2016   Erectile dysfunction due to type 2 diabetes mellitus ([place]) 05/08/2016   Poorly controlled diabetes mellitus ([place]) 03/30/2016   Necrotizing fasciitis ([place]) 03/07/2016   Pyelonephritis 03/04/2016   Acquired contracture of Achilles tendon, left 02/15/2016   Arthralgia of multiple joints 01/19/2016   Diabetic polyneuropathy associated with type 2 diabetes mellitus ([place]) 01/18/2016   Acquired absence of other left toe(s) ([place]) 01/18/2016   Foot amputation status, left 01/04/2016   Peripheral neuropathy 11/11/2015   AKI (acute kidney injury) ([place])    Asthma 07/23/2015   Chronic headache 07/23/2015   Erectile disorder due to medical condition in male 04/23/2015   BMI 33.0-33.9,adult 04/21/2012   GERD 04/21/2008   Type 2 diabetes mellitus with diabetic neuropathy, with long-term current use of insulin ([place]) 11/21/2006   Essential hypertension 11/21/2006   Lumbar sprain 11/21/2006   Past Surgical History:  Procedure Laterality Date   AMPUTATION Left 07/26/2015   Procedure: AMPUTATION LEFT FIFTH RAY;  Surgeon: [name], MD;  Location: [place];  Service: Orthopedics;  Laterality: Left;   bilateral hip pins placed     INCISION AND DRAINAGE PERIRECTAL ABSCESS Right 03/07/2016   Procedure: IRRIGATION AND DEBRIDEMENT PERIRECTAL ABSCESS;  Surgeon:  Alphonsa Overall, MD;  Location: [facility];  Service: General;  Laterality: Right;   INCISION AND DRAINAGE PERIRECTAL ABSCESS Right 03/09/2016   Procedure: EXAM UNDER ANESTHESIA, IRRIGATION AND DEBRIDEMENT PERIRECTAL ABSCESS;  Surgeon: Johnathan Hausen, MD;  Location: [facility];  Service: General;  Laterality: Right;  Open, betadine packed wound   TOE AMPUTATION Left 11/19/2019   Removal of all toes from left foot   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/20/2019   Procedure: TRANSMETATARSAL AMPUTATION LEFT FOOT;   Surgeon: Wylene Simmer, MD;  Location: [place];  Service: Orthopedics;  Laterality: Left;     Home Medications    Prior to Admission medications   Medication Sig Start Date End Date Taking? Authorizing Provider  amLODipine-benazepril (LOTREL) 10-40 MG capsule Take 1 capsule by mouth daily. 07/02/20   [name], MD  cloNIDine (CATAPRES - DOSED IN MG/24 HR) 0.2 mg/24hr patch Place 1 patch (0.2 mg total) onto the skin once a week. 06/02/20   [name], MD  furosemide (LASIX) 20 MG tablet Take 1 tablet (20 mg total) by mouth daily. Patient taking differently: Take 20 mg by mouth as needed for fluid (swelling). 06/24/19   Minus Breeding, MD  glucose blood test strip Use Bayer Contour Next Test strips as instructed to check blood sugar 4 times daily. 07/08/19   [name], MD  glucose blood test strip 1 each by Other route 4 (four) times daily.     [provider]  insulin aspart (NOVOLOG) 100 UNIT/ML injection Use maximum of 0.6 mL/day in insulin pump 12/23/19   [name], MD  Insulin Disposable Pump (OMNIPOD DASH PODS, GEN 4,) MISC INJECT 1 EACH INTO THE SKIN CONTINUOUS. APPLY 1 OMNIPOD INSULIN PUMP TO BODY DAILY FOR INSULIN 06/25/20   [name], MD  Insulin Pen Needle 31G X 5 MM MISC 1 each by Other route daily.  08/07/16   [provider]  labetalol (NORMODYNE) 100 MG tablet Take 1 tablet (100 mg total) by mouth 2 (two) times daily. TAKE 1 TABLET BY MOUTH TWICE DAILY. Patient taking differently: Take 100 mg by mouth 2 (two) times daily. 11/03/19   [name], MD  latanoprost (XALATAN) 0.005 % ophthalmic solution Place 1 drop into both eyes daily. 10/24/19   [provider]  LYRICA 150 MG capsule Take 1 capsule (150 mg total) by mouth 3 (three) times daily. 11/02/17   Patel, Arvin Collard K, DO  nortriptyline (PAMELOR) 10 MG capsule TAKE 4 CAPSULES BY MOUTH AT BEDTIME 05/26/20   Patel, Donika K, DO  pravastatin (PRAVACHOL) 40 MG tablet Take 1 tablet (40 mg total) by mouth  every evening. Patient taking differently: Take 40 mg by mouth daily. 07/28/19 12/15/19  [name], MD  traMADol (ULTRAM) 50 MG tablet Take 2 tablets (100 mg total) by mouth every 6 (six) hours as needed. 09/10/20   [name], MD   Family History Family History  Problem Relation Age of Onset   Arthritis Other    Diabetes Other    Hypertension Other    Hyperlipidemia Other    Stroke Other    Sudden death Other    Diabetes Mother    Diabetes Sister    Diabetes Brother    Heart attack Father        Died ate 86, couple of "heart attacks"    Social History Social History   Tobacco Use   Smoking status: Former    Packs/day: 0.50    Years: 20.00  Pack years: 10.00    Types: Cigarettes    Quit date: 07/23/2015    Years since quitting: 5.2   Smokeless tobacco: Never  Vaping Use   Vaping Use: Never used  Substance Use Topics   Alcohol use: No    Alcohol/week: 0.0 standard drinks   Drug use: No     Allergies   Augmentin [amoxicillin-pot clavulanate] and Vancomycin   Review of Systems Review of Systems Per HPI  Physical Exam Triage Vital Signs ED Triage Vitals [10/10/20 1228]  Enc Vitals Group     BP (!) 176/90     Pulse Rate 96     Resp 16     Temp 98.8 F (37.1 C)     Temp Source Oral     SpO2 97 %     Weight      Height      Head Circumference      Peak Flow      Pain Score 5     Pain Loc      Pain Edu?      Excl. in [place]?    No data found.  Updated Vital Signs BP (!) 176/90   Pulse 96   Temp 98.8 F (37.1 C) (Oral)   Resp 16   SpO2 97%   Visual Acuity Right Eye Distance:   Left Eye Distance:   Bilateral Distance:    Right Eye Near:   Left Eye Near:    Bilateral Near:     Physical Exam Vitals and nursing note reviewed.  Constitutional:      Appearance: Normal appearance.  HENT:     Head: Atraumatic.  Eyes:     Extraocular Movements: Extraocular movements intact.     Conjunctiva/sclera: Conjunctivae normal.  Cardiovascular:      Rate and Rhythm: Normal rate and regular rhythm.  Pulmonary:     Effort: Pulmonary effort is normal.     Breath sounds: Normal breath sounds.  Musculoskeletal:     Cervical back: Normal range of motion and neck supple.     Comments: Good range of motion all 5 fingers of the left hand.  No edema Initially, long-arm splint was in place.  This was removed as it had gotten wet.  Skin:    General: Skin is warm.     Comments: Incision site left forearm into elbow appears intact, no evidence of infection.  Steri-Strips in place, no erythema, edema, drainage to the area.  Neurological:     General: No focal deficit present.     Mental Status: He is oriented to person, place, and time.     Comments: Left upper extremity neurovascularly intact  Psychiatric:        Mood and Affect: Mood normal.        Thought Content: Thought content normal.        Judgment: Judgment normal.     UC Treatments / Results  Labs (all labs ordered are listed, but only abnormal results are displayed) Labs Reviewed - No data to display  EKG   Radiology No results found.  Procedures Procedures (including critical care time)  Medications Ordered in UC Medications - No data to display  Initial Impression / Assessment and Plan / UC Course  I have reviewed the triage vital signs and the nursing notes.  Pertinent labs & imaging results that were available during my care of the patient were reviewed by me and considered in my medical decision making (see chart for details).  No evidence of surgical site infection at this time.  We will place a new long-arm splint as his initial dressing got wet last night.  Continue pain control as needed, elevate the arm at rest.  Discussed following up with surgeon first thing tomorrow morning for further recommendations.  Final Clinical Impressions(s) / UC Diagnoses   Final diagnoses:  Post-operative pain  Visit for wound check     Discharge Instructions       We have taken off the wet splint and placed a new splint.  Call your surgeon first thing tomorrow morning to update them and see if they have further recommendations.     ED Prescriptions   None    PDMP not reviewed this encounter.   Volney American, [place] 10/10/20 [phone]

## 2020-10-10 NOTE — ED Triage Notes (Signed)
PT had an operation for a nerve in left lower arm Monday. He took a shower yesterday and believes he may have gotten some water in cast. Reports superficial burning sensation

## 2020-10-29 DIAGNOSIS — M25522 Pain in left elbow: Secondary | ICD-10-CM | POA: Diagnosis not present

## 2020-11-08 DIAGNOSIS — M25522 Pain in left elbow: Secondary | ICD-10-CM | POA: Diagnosis not present

## 2020-11-16 DIAGNOSIS — M25522 Pain in left elbow: Secondary | ICD-10-CM | POA: Diagnosis not present

## 2020-12-01 ENCOUNTER — Ambulatory Visit (INDEPENDENT_AMBULATORY_CARE_PROVIDER_SITE_OTHER): Payer: Medicare Other | Admitting: Neurology

## 2020-12-01 ENCOUNTER — Other Ambulatory Visit: Payer: Self-pay

## 2020-12-01 ENCOUNTER — Encounter: Payer: Self-pay | Admitting: Neurology

## 2020-12-01 VITALS — BP 196/122 | HR 95 | Ht 71.0 in | Wt 259.0 lb

## 2020-12-01 DIAGNOSIS — E1142 Type 2 diabetes mellitus with diabetic polyneuropathy: Secondary | ICD-10-CM | POA: Diagnosis not present

## 2020-12-01 DIAGNOSIS — R2681 Unsteadiness on feet: Secondary | ICD-10-CM

## 2020-12-01 DIAGNOSIS — G5622 Lesion of ulnar nerve, left upper limb: Secondary | ICD-10-CM | POA: Diagnosis not present

## 2020-12-01 NOTE — Progress Notes (Signed)
Follow-up Visit   Date: 12/01/20    Danny Fuller MRN: [id number] DOB: 11-09-1975   Interim History: Danny Fuller is a 45 y.o. 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  The patient was accompanied to the clinic by self.  His neuropathy is stable.  He is compliant with Lyrica 150mg  three times daily and nortriptyline 40mg  at bedtime which controls pain adequately.  He underwent left ulnar decompression in August and feels that his hand strength has improved some.  He is driving autoparts at night time and was drinking energy drinks last night to keep him awake.  His BP is very high this morning, he denies chest pain, shortness of breath, or dizziness. He feels that his balance is getting worse.  He walks unassisted.  He had a fall earlier this year.     Medications:  Current Outpatient Medications on File Prior to Visit  Medication Sig Dispense Refill   amLODipine-benazepril (LOTREL) 10-40 MG capsule Take 1 capsule by mouth daily. 1 capsule 0   cloNIDine (CATAPRES - DOSED IN MG/24 HR) 0.2 mg/24hr patch Place 1 patch (0.2 mg total) onto the skin once a week. 4 patch 12   Dulaglutide (TRULICITY) 1.5 MC/9.4BS SOPN Trulicity 1.5 JG/2.8 mL subcutaneous pen injector     glucose blood test strip Use Bayer Contour Next Test strips as instructed to check blood sugar 4 times daily. 150 each 3   glucose blood test strip 1 each by Other route 4 (four) times daily.      insulin aspart (NOVOLOG) 100 UNIT/ML injection Use maximum of 0.6 mL/day in insulin pump 40 mL 2   Insulin Disposable Pump (OMNIPOD DASH PODS, GEN 4,) MISC INJECT 1 EACH INTO THE SKIN CONTINUOUS. APPLY 1 OMNIPOD INSULIN PUMP TO BODY DAILY FOR INSULIN 30 each 1   Insulin Pen Needle 31G X 5 MM MISC 1 each by Other route daily.      labetalol (NORMODYNE) 100 MG tablet Take 1 tablet  (100 mg total) by mouth 2 (two) times daily. TAKE 1 TABLET BY MOUTH TWICE DAILY. (Patient taking differently: Take 100 mg by mouth 2 (two) times daily.) 180 tablet 3   latanoprost (XALATAN) 0.005 % ophthalmic solution Place 1 drop into both eyes daily.     LYRICA 150 MG capsule Take 1 capsule (150 mg total) by mouth 3 (three) times daily. 90 capsule 5   nortriptyline (PAMELOR) 10 MG capsule TAKE 4 CAPSULES BY MOUTH AT BEDTIME 360 capsule 3   pravastatin (PRAVACHOL) 40 MG tablet Take 1 tablet (40 mg total) by mouth every evening. (Patient taking differently: Take 40 mg by mouth daily.) 90 tablet 3   traMADol (ULTRAM) 50 MG tablet Take 2 tablets (100 mg total) by mouth every 6 (six) hours as needed. 120 tablet 2   No current facility-administered medications on file prior to visit.    Allergies:  Allergies  Allergen Reactions   Augmentin [Amoxicillin-Pot Clavulanate] Itching and Other (See Comments)    Dizziness   Vancomycin Other (See Comments)    AKI    Vital Signs:  BP (!) 196/122   Pulse 95   Ht 5\' 11"  (1.803 m)   Wt 259 lb (117.5 kg)   SpO2 97%   BMI 36.12 kg/m   Neurological Exam: MENTAL STATUS including orientation to time, place, person, recent and remote memory, attention span and concentration,  language, and fund of knowledge is normal.  Speech is not dysarthric.  CRANIAL NERVES:  Normal conjugate, extra-ocular eye movements in all directions of gaze.  No ptosis.  MOTOR:  Motor strength is 5/5 in all extremities, except left finger abductors 4/5, right toe extensors and flexors 4/5. s/p left mid-foot amputation  MSRs:  Reflexes are 2+/4 in the upper extremities, 1+ at the left patella, and absent left patella and ankles  SENSORY:  Vibration is absent below the ankles  COORDINATION/GAIT:  Gait appears mildly wide-based, stable.  Unsteady with tandem gait.    Data: NCS/EMG of the right side 12/14/2016:   The electrophysiologic findings are most consistent with a length  dependent sensorimotor polyneuropathy, axon loss and demyelinating in type, affecting the right side.  Overall, these findings are severe in degree electrically. 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  NCS/EMG of the upper extremities 07/31/2017:  The electrophysiologic findings are most suggestive of a sensorimotor polyradiculoneuropathy affecting the upper extremities, moderate in degree electrically, which is new when compared to his previous study dated 12/31/2016. Alternatively, progression of sensorimotor demyelinating and axonal polyneuropathy with a superimposed ulnar neuropathy across the elbow (worse on the left), cannot be excluded. Correlate clinically.  MRI lumbar spine wo contrast 08/22/2017: 1. 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 The left foramen also appears mildly narrowed. 2. Mild left foraminal narrowing at L5-S1 without definite nerve root encroachment. 3. No other significant acquired disc space findings. Congenitally short pedicles.  Labs 04/21/2020:  HbA1c 11 > 7.0   Lab Results  Component Value Date   CREATININE 1.15 12/30/2019   BUN 13 12/30/2019   NA 137 12/30/2019   K 4.2 12/30/2019   CL 105 12/30/2019   CO2 23 12/30/2019     IMPRESSION/PLAN: 1. Diabetic polyradiculoneuropathy affecting the hands and lower legs s/p left mid-foot amputation (2021), improved YFV4B 11 > 6.8 on Trulicity.  He has been able to reduce insulin.  Neuropathy is stable.  - Continue Lyrica 150mg  TID  - Continue nortriptyline 40mg  at bedtime  - Patient educated on daily foot inspection, fall prevention, and safety precautions around the home.  - Refer to PT for balance training   2. 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 Resistant hypertension,  asymptomatic  - He is scheduled to see his PCP next week  Return to clinic in 6 months  Total time spent reviewing records, interview, history/exam, documentation, and coordination of care on day of encounter:  20 min     Thank you for allowing me to participate in patient's care.  If I can answer any additional questions, I would be pleased to do so.    Sincerely,    Sonnie Pawloski K. Posey Pronto, DO

## 2020-12-01 NOTE — Patient Instructions (Signed)
We will refer you to out-patient physical therapy for balance training  Return to clinic in 6 months

## 2020-12-16 ENCOUNTER — Ambulatory Visit: Payer: Medicare Other | Attending: Neurology | Admitting: Physical Therapy

## 2020-12-18 ENCOUNTER — Other Ambulatory Visit: Payer: Self-pay

## 2020-12-18 ENCOUNTER — Encounter (HOSPITAL_BASED_OUTPATIENT_CLINIC_OR_DEPARTMENT_OTHER): Payer: Self-pay | Admitting: *Deleted

## 2020-12-18 ENCOUNTER — Emergency Department (HOSPITAL_BASED_OUTPATIENT_CLINIC_OR_DEPARTMENT_OTHER): Payer: Medicare Other

## 2020-12-18 DIAGNOSIS — R2242 Localized swelling, mass and lump, left lower limb: Secondary | ICD-10-CM | POA: Insufficient documentation

## 2020-12-18 DIAGNOSIS — M25562 Pain in left knee: Secondary | ICD-10-CM | POA: Diagnosis not present

## 2020-12-18 DIAGNOSIS — L97529 Non-pressure chronic ulcer of other part of left foot with unspecified severity: Secondary | ICD-10-CM | POA: Diagnosis not present

## 2020-12-18 DIAGNOSIS — E11621 Type 2 diabetes mellitus with foot ulcer: Secondary | ICD-10-CM | POA: Diagnosis not present

## 2020-12-18 DIAGNOSIS — M7989 Other specified soft tissue disorders: Secondary | ICD-10-CM | POA: Diagnosis not present

## 2020-12-18 DIAGNOSIS — Z5321 Procedure and treatment not carried out due to patient leaving prior to being seen by health care provider: Secondary | ICD-10-CM | POA: Insufficient documentation

## 2020-12-18 LAB — CBG MONITORING, ED: Glucose-Capillary: 341 mg/dL — ABNORMAL HIGH (ref 70–99)

## 2020-12-18 NOTE — ED Triage Notes (Signed)
Pt reports left knee pain and swelling x 2 weeks. Denies injury. Has partial amputation of left foot r/t diabetic ulcer

## 2020-12-19 ENCOUNTER — Emergency Department (HOSPITAL_BASED_OUTPATIENT_CLINIC_OR_DEPARTMENT_OTHER)
Admission: EM | Admit: 2020-12-19 | Discharge: 2020-12-19 | Disposition: A | Discharge status: Home / Self Care | Payer: Medicare Other | Attending: Emergency Medicine | Admitting: Emergency Medicine

## 2020-12-19 NOTE — ED Notes (Signed)
Pt called in lobby. No answer.

## 2020-12-21 ENCOUNTER — Ambulatory Visit: Payer: Medicare Other | Admitting: Family Medicine

## 2020-12-23 ENCOUNTER — Ambulatory Visit: Payer: Medicare Other | Admitting: Family Medicine

## 2020-12-23 ENCOUNTER — Encounter: Payer: Self-pay | Admitting: Family Medicine

## 2020-12-23 DIAGNOSIS — L84 Corns and callosities: Secondary | ICD-10-CM | POA: Diagnosis not present

## 2020-12-23 DIAGNOSIS — M25562 Pain in left knee: Secondary | ICD-10-CM | POA: Diagnosis not present

## 2020-12-23 DIAGNOSIS — E1140 Type 2 diabetes mellitus with diabetic neuropathy, unspecified: Secondary | ICD-10-CM | POA: Diagnosis not present

## 2020-12-31 ENCOUNTER — Ambulatory Visit (INDEPENDENT_AMBULATORY_CARE_PROVIDER_SITE_OTHER): Payer: Medicare Other | Admitting: Family Medicine

## 2020-12-31 ENCOUNTER — Encounter: Payer: Self-pay | Admitting: Family Medicine

## 2020-12-31 VITALS — BP 178/110 | HR 92 | Temp 98.0°F | Wt 257.0 lb

## 2020-12-31 DIAGNOSIS — Z23 Encounter for immunization: Secondary | ICD-10-CM | POA: Diagnosis not present

## 2020-12-31 DIAGNOSIS — I10 Essential (primary) hypertension: Secondary | ICD-10-CM

## 2020-12-31 MED ORDER — CYCLOBENZAPRINE HCL 10 MG PO TABS: 10.0000 mg | ORAL_TABLET | Freq: Three times a day (TID) | ORAL | 5 refills | Status: DC | PRN

## 2020-12-31 MED ORDER — LABETALOL HCL 200 MG PO TABS: 200.0000 mg | ORAL_TABLET | Freq: Two times a day (BID) | ORAL | 5 refills | Status: DC

## 2020-12-31 NOTE — Progress Notes (Signed)
   Subjective:    Patient ID: Danny Fuller, male    DOB: 10/15/1975, 45 y.o.   MRN: [id number]  HPI Here to follow up on BP. He has been feeling well. 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 His BP at home has ben running high, sometimes up to 409 systolic. He says he never misses a dose of medication. He sometimes gets a mild HA when the BP goes up, but no chest pain or SOB.    Review of Systems  Constitutional: Negative.   Respiratory: Negative.    Cardiovascular: Negative.   Neurological:  Positive for headaches. Negative for dizziness and light-headedness.      Objective:   Physical Exam Constitutional:      Appearance: Normal appearance. He is not ill-appearing.  Cardiovascular:     Rate and Rhythm: Normal rate and regular rhythm.     Pulses: Normal pulses.     Heart sounds: Normal heart sounds.  Pulmonary:     Effort: Pulmonary effort is normal.     Breath sounds: Normal breath sounds.  Neurological:     Mental Status: He is alert.          Assessment & Plan:  HTN. We will increase the Labetalol to 200 mg BID. Recheck in 3-4 weeks. [name], MD

## 2020-12-31 NOTE — Addendum Note (Signed)
Addended by: Wyvonne Lenz on: 12/31/2020 12:31 PM   Modules accepted: Orders

## 2021-01-14 DIAGNOSIS — M1712 Unilateral primary osteoarthritis, left knee: Secondary | ICD-10-CM | POA: Diagnosis not present

## 2021-02-02 ENCOUNTER — Ambulatory Visit: Payer: Medicare Other

## 2021-03-23 DIAGNOSIS — H401131 Primary open-angle glaucoma, bilateral, mild stage: Secondary | ICD-10-CM | POA: Diagnosis not present

## 2021-05-03 ENCOUNTER — Ambulatory Visit (INDEPENDENT_AMBULATORY_CARE_PROVIDER_SITE_OTHER): Payer: Medicare Other

## 2021-05-03 VITALS — Ht 71.0 in | Wt 257.0 lb

## 2021-05-03 DIAGNOSIS — Z0000 Encounter for general adult medical examination without abnormal findings: Secondary | ICD-10-CM

## 2021-05-03 NOTE — Patient Instructions (Addendum)
?Danny Fuller , ?Thank you for taking time to come for your Medicare Wellness Visit. I appreciate your ongoing commitment to your health goals. Please review the following plan we discussed and let me know if I can assist you in the future.  ? ?These are the goals we discussed: ? ?Opioid Pain Medicine Management ?Opioids are powerful medicines that are used to treat moderate to severe pain. When used for short periods of time, they can help you to: ?Sleep better. ?Do better in physical or occupational therapy. ?Feel better in the first few days after an injury. ?Recover from surgery. ?Opioids should be taken with the supervision of a trained health care provider. They should be taken for the shortest period of time possible. This is because opioids can be addictive, and the longer you take opioids, the greater your risk of addiction. This addiction can also be called opioid use disorder. ?What are the risks? ?Using opioid pain medicines for longer than 3 days increases your risk of side effects. Side effects include: ?Constipation. ?Nausea and vomiting. ?Breathing difficulties (respiratory depression). ?Drowsiness. ?Confusion. ?Opioid use disorder. ?Itching. ?Taking opioid pain medicine for a long period of time can affect your ability to do daily tasks. It also puts you at risk for: ?Motor vehicle crashes. ?Depression. ?Suicide. ?Heart attack. ?Overdose, which can be life-threatening. ?What is a pain treatment plan? ?A pain treatment plan is an agreement between you and your health care provider. Pain is unique to each person, and treatments vary depending on your condition. To manage your pain, you and your health care provider need to work together. To help you do this: ?Discuss the goals of your treatment, including how much pain you might expect to have and how you will manage the pain. ?Review the risks and benefits of taking opioid medicines. ?Remember that a good treatment plan uses more than one approach and  minimizes the chance of side effects. ?Be honest about the amount of medicines you take and about any drug or alcohol use. ?Get pain medicine prescriptions from only one health care provider. ?Pain can be managed with many types of alternative treatments. Ask your health care provider to refer you to one or more specialists who can help you manage pain through: ?Physical or occupational therapy. ?Counseling (cognitive behavioral therapy). ?Good nutrition. ?Biofeedback. ?Massage. ?Meditation. ?Non-opioid medicine. ?Following a gentle exercise program. ?How to use opioid pain medicine ?Taking medicine ?Take your pain medicine exactly as told by your health care provider. Take it only when you need it. ?If your pain gets less severe, you may take less than your prescribed dose if your health care provider approves. ?If you are not having pain, do nottake pain medicine unless your health care provider tells you to take it. ?If your pain is severe, do nottry to treat it yourself by taking more pills than instructed on your prescription. Contact your health care provider for help. ?Write down the times when you take your pain medicine. It is easy to become confused while on pain medicine. Writing the time can help you avoid overdose. ?Take other over-the-counter or prescription medicines only as told by your health care provider. ?Keeping yourself and others safe ? ?While you are taking opioid pain medicine: ?Do not drive, use machinery, or power tools. ?Do not sign legal documents. ?Do not drink alcohol. ?Do not take sleeping pills. ?Do not supervise children by yourself. ?Do not do activities that require climbing or being in high places. ?Do not go to a  lake, river, ocean, spa, or swimming pool. ?Do not share your pain medicine with anyone. ?Keep pain medicine in a locked cabinet or in a secure area where pets and children cannot reach it. ?Stopping your use of opioids ?If you have been taking opioid medicine for more  than a few weeks, you may need to slowly decrease (taper) how much you take until you stop completely. Tapering your use of opioids can decrease your risk of symptoms of withdrawal, such as: ?Pain and cramping in the abdomen. ?Nausea. ?Sweating. ?Sleepiness. ?Restlessness. ?Uncontrollable shaking (tremors). ?Cravings for the medicine. ?Do not attempt to taper your use of opioids on your own. Talk with your health care provider about how to do this. Your health care provider may prescribe a step-down schedule based on how much medicine you are taking and how long you have been taking it. ?Getting rid of leftover pills ?Do not save any leftover pills. Get rid of leftover pills safely by: ?Taking the medicine to a prescription take-back program. This is usually offered by the county or law enforcement. ?Bringing them to a pharmacy that has a drug disposal container. ?Flushing them down the toilet. Check the label or package insert of your medicine to see whether this is safe to do. ?Throwing them out in the trash. Check the label or package insert of your medicine to see whether this is safe to do. ?If it is safe to throw it out, remove the medicine from the original container, put it into a sealable bag or container, and mix it with used coffee grounds, food scraps, dirt, or cat litter before putting it in the trash. ?Follow these instructions at home: ?Activity ?Do exercises as told by your health care provider. ?Avoid activities that make your pain worse. ?Return to your normal activities as told by your health care provider. Ask your health care provider what activities are safe for you. ?General instructions ?You may need to take these actions to prevent or treat constipation: ?Drink enough fluid to keep your urine pale yellow. ?Take over-the-counter or prescription medicines. ?Eat foods that are high in fiber, such as beans, whole grains, and fresh fruits and vegetables. ?Limit foods that are high in fat and  processed sugars, such as fried or sweet foods. ?Keep all follow-up visits. This is important. ?Where to find support ?If you have been taking opioids for a long time, you may benefit from receiving support for quitting from a local support group or counselor. Ask your health care provider for a referral to these resources in your area. ?Where to find more information ?Centers for Disease Control and Prevention (CDC): http://www.wolf.info/ ?[place] Food and Drug Administration (FDA): [place] ?Get help right away if: ?You may have taken too much of an opioid (overdosed). Common symptoms of an overdose: ?Your breathing is slower or more shallow than normal. ?You have a very slow heartbeat (pulse). ?You have slurred speech. ?You have nausea and vomiting. ?Your pupils become very small. ?You have other potential symptoms: ?You are very confused. ?You faint or feel like you will faint. ?You have cold, clammy skin. ?You have blue lips or fingernails. ?You have thoughts of harming yourself or harming others. ?These symptoms may represent a serious problem that is an emergency. Do not wait to see if the symptoms will go away. Get medical help right away. Call your local emergency services (911 in the [place] Do not drive yourself to the hospital.  ?If you ever feel like you may hurt yourself or  others, or have thoughts about taking your own life, get help right away. Go to your nearest emergency department or: ?Call your local emergency services (911 in the [place] ?Call the [facility] 3196660722 in the [place] ?Call a suicide crisis helpline, such as the [place] at 817-703-4337 or 988 in the [place]. This is open 24 hours a day in the [place] ?Text the Crisis Text Line at (210)247-1455 (in the [place].). ?Summary ?Opioid medicines can help you manage moderate to severe pain for a short period of time. ?A pain treatment plan is an agreement between you and your health care provider. Discuss the  goals of your treatment, including how much pain you might expect to have and how you will manage the pain. ?If you think that you or someone else may have taken too much of an opioid, get medical help right

## 2021-05-03 NOTE — Progress Notes (Signed)
? ?Subjective:  ? Danny Fuller is a 46 y.o. male who presents for Medicare Annual/Subsequent preventive examination. ? ?Review of Systems    ?Virtual Visit via Telephone Note ? ?I connected with  Danny Fuller on 05/03/21 at  9:45 AM EDT by telephone and verified that I am speaking with the correct person using two identifiers. ? ?Location: ?Patient: Home ?Provider: Office ?Persons participating in the virtual visit: patient/Nurse Health Advisor ?  ?I discussed the limitations, risks, security and privacy concerns of performing an evaluation and management service by telephone and the availability of in person appointments. The patient expressed understanding and agreed to proceed. ? ?Interactive audio and video telecommunications were attempted between this nurse and patient, however failed, due to patient having technical difficulties OR patient did not have access to video capability.  We continued and completed visit with audio only. ? ?Some vital signs may be absent or patient reported.  ? ?Danny Peaches, LPN  ?Cardiac Risk Factors include: diabetes mellitus;male gender;hypertension ? ?   ?Objective:  ?  ?Today's Vitals  ? 05/03/21 0947  ?Weight: 257 lb (116.6 kg)  ?Height: '5\' 11"'$  (1.803 m)  ? ?Body mass index is 35.84 kg/m?. ? ?Advanced Directives 05/03/2021 12/18/2020 12/01/2020 05/31/2020 02/02/2020 01/15/2020 12/15/2019  ?Does Patient Have a Medical Advance Directive? No No No No No No No  ?Would patient like information on creating a medical advance directive? No - Patient declined - - - Yes (MAU/Ambulatory/Procedural Areas - Information given) Yes (MAU/Ambulatory/Procedural Areas - Information given) No - Patient declined  ? ? ?Current Medications (verified) ?Outpatient Encounter Medications as of 05/03/2021  ?Medication Sig  ? amLODipine-benazepril (LOTREL) 10-40 MG capsule Take 1 capsule by mouth daily.  ? cloNIDine (CATAPRES - DOSED IN MG/24 HR) 0.2 mg/24hr patch Place 1 patch (0.2 mg total) onto  the skin once a week.  ? cyclobenzaprine (FLEXERIL) 10 MG tablet Take 1 tablet (10 mg total) by mouth 3 (three) times daily as needed for muscle spasms.  ? Dulaglutide (TRULICITY) 1.5 PT/4.6FK SOPN Trulicity 1.5 CL/2.7 mL subcutaneous pen injector  ? glucose blood test strip 1 each by Other route 4 (four) times daily.   ? insulin aspart (NOVOLOG) 100 UNIT/ML injection Use maximum of 0.6 mL/day in insulin pump  ? Insulin Disposable Pump (OMNIPOD DASH PODS, GEN 4,) MISC INJECT 1 EACH INTO THE SKIN CONTINUOUS. APPLY 1 OMNIPOD INSULIN PUMP TO BODY DAILY FOR INSULIN  ? Insulin Pen Needle 31G X 5 MM MISC 1 each by Other route daily.   ? labetalol (NORMODYNE) 200 MG tablet Take 1 tablet (200 mg total) by mouth 2 (two) times daily.  ? latanoprost (XALATAN) 0.005 % ophthalmic solution Place 1 drop into both eyes daily.  ? LYRICA 150 MG capsule Take 1 capsule (150 mg total) by mouth 3 (three) times daily.  ? nortriptyline (PAMELOR) 10 MG capsule TAKE 4 CAPSULES BY MOUTH AT BEDTIME  ? pravastatin (PRAVACHOL) 40 MG tablet Take 1 tablet (40 mg total) by mouth every evening. (Patient taking differently: Take 40 mg by mouth daily.)  ? traMADol (ULTRAM) 50 MG tablet Take 2 tablets (100 mg total) by mouth every 6 (six) hours as needed.  ? ?No facility-administered encounter medications on file as of 05/03/2021.  ? ? ?Allergies (verified) ?Augmentin [amoxicillin-pot clavulanate] and Vancomycin  ? ?History: ?Past Medical History:  ?Diagnosis Date  ? Asthma   ? Back pain   ? Bulging Disk  ? Chronic kidney disease   ? sees  Dr. Jamal Maes    ? Diabetes mellitus   ? sees Dr. Dwyane Dee   ? Diabetic foot ulcers ([place])   ? sees Dr. Wylene Simmer   ? GERD (gastroesophageal reflux disease)   ? Glaucoma   ? Headache(784.0)   ? Hyperlipidemia   ? Hypertension   ? sees Dr. Percival Spanish   ? Sleep apnea   ? CPAP  ? ?Past Surgical History:  ?Procedure Laterality Date  ? AMPUTATION Left 07/26/2015  ? Procedure: AMPUTATION LEFT FIFTH RAY;  Surgeon: [name], MD;  Location: [facility];  Service: Orthopedics;  Laterality: Left;  ? bilateral hip pins placed    ? INCISION AND DRAINAGE PERIRECTAL ABSCESS Right 03/07/2016  ? Procedure: IRRIGATION AND DEBRIDEMENT PERIRECTAL ABSCESS;  Surgeon: Alphonsa Overall, MD;  Location: [facility];  Service: General;  Laterality: Right;  ? INCISION AND DRAINAGE PERIRECTAL ABSCESS Right 03/09/2016  ? Procedure: EXAM UNDER ANESTHESIA, IRRIGATION AND DEBRIDEMENT PERIRECTAL ABSCESS;  Surgeon: Johnathan Hausen, MD;  Location: [facility];  Service: General;  Laterality: Right;  Open, betadine packed wound  ? TOE AMPUTATION Left 11/19/2019  ? Removal of all toes from left foot  ? 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/20/2019  ? Procedure: TRANSMETATARSAL AMPUTATION LEFT FOOT;  Surgeon: Wylene Simmer, MD;  Location: [facility];  Service: Orthopedics;  Laterality: Left;  ? ?Family History  ?Problem Relation Age of Onset  ? Arthritis Other   ? Diabetes Other   ? Hypertension Other   ? Hyperlipidemia Other   ? Stroke Other   ? Sudden death Other   ? Diabetes Mother   ? Diabetes Sister   ? Diabetes Brother   ? Heart attack Father   ?     Died ate 11, couple of "heart attacks"   ? ?Social History  ? ?Socioeconomic History  ? Marital status: Married  ?  Spouse name: Not on file  ? Number of children: 2  ? Years of education: 72  ? Highest education level: 12th grade  ?Occupational History  ? Occupation: disability  ?Tobacco Use  ? Smoking status: Former  ?  Packs/day: 0.50  ?  Years: 20.00  ?  Pack years: 10.00  ?  Types: Cigarettes  ?  Quit date: 07/23/2015  ?  Years since quitting: 5.7  ? Smokeless tobacco: Never  ?Vaping Use  ? Vaping Use: Never used  ?Substance and Sexual Activity  ? Alcohol use: No  ?  Alcohol/week: 0.0 standard drinks  ? Drug use: No  ? Sexual activity: Not on file  ?Other Topics Concern  ? Not on file  ?Social History Narrative  ? Lives with wife and two children in a one story home.  Not working.  Still trying for disability.    ? 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    ? Education: high school.   ? Left handed  ? One story home  ? ?Social Determinants of Health  ? ?Financial Resource Strain: Low Risk   ? Difficulty of Paying Living Expenses: Not hard at all  ?Food Insecurity: No Food Insecurity  ? Worried About Charity fundraiser in the Last Year: Never true  ? Ran Out of Food in the Last Year: Never true  ?Transportation Needs: No Transportation Needs  ? Lack of Transportation (Medical): No  ? Lack of Transportation (Non-Medical): No  ?Physical Activity: Insufficiently Active  ? Days of Exercise per Week: 4 days  ? Minutes of Exercise per Session: 30 min  ?  Stress: No Stress Concern Present  ? Feeling of Stress : Not at all  ?Social Connections: Socially Integrated  ? Frequency of Communication with Friends and Family: More than three times a week  ? Frequency of Social Gatherings with Friends and Family: More than three times a week  ? Attends Religious Services: More than 4 times per year  ? Active Member of Clubs or Organizations: Yes  ? Attends Archivist Meetings: More than 4 times per year  ? Marital Status: Married  ? ? ?Clinical Intake: ?Nutrition Risk Assessment: ? ?Has the patient had any N/V/D within the last 2 months?  No  ?Does the patient have any non-healing wounds?  No  ?Has the patient had any unintentional weight loss or weight gain?  No  ? ?Diabetes: ? ?Is the patient diabetic?  Yes  ?If diabetic, was a CBG obtained today?  Yes  CBG 104 Taken by patient ?Did the patient bring in their glucometer from home?  No Audio Visit ?How often do you monitor your CBG's? Daily.  ? ?Financial Strains and Diabetes Management: ? ?Are you having any financial strains with the device, your supplies or your medication? No .  ?Does the patient want to be seen by Chronic Care Management for management of their diabetes?  No  ?Would the patient like to be referred to a Nutritionist or for Diabetic Management?  No  ? ?Diabetic  Exams: ? ?Diabetic Eye Exam: Completed Yes. Overdue for diabetic eye exam. Pt has been advised about the importance in completing this exam. A referral has been placed today. Message sent to referral coordinat

## 2021-05-30 DIAGNOSIS — L603 Nail dystrophy: Secondary | ICD-10-CM | POA: Diagnosis not present

## 2021-05-30 DIAGNOSIS — Z89439 Acquired absence of unspecified foot: Secondary | ICD-10-CM | POA: Diagnosis not present

## 2021-05-30 DIAGNOSIS — E13621 Other specified diabetes mellitus with foot ulcer: Secondary | ICD-10-CM | POA: Diagnosis not present

## 2021-05-30 DIAGNOSIS — L84 Corns and callosities: Secondary | ICD-10-CM | POA: Diagnosis not present

## 2021-05-30 DIAGNOSIS — E1140 Type 2 diabetes mellitus with diabetic neuropathy, unspecified: Secondary | ICD-10-CM | POA: Diagnosis not present

## 2021-05-31 ENCOUNTER — Other Ambulatory Visit: Payer: Self-pay | Admitting: Neurology

## 2021-05-31 NOTE — Telephone Encounter (Signed)
Ok to send refills until follow-up, thanks ?

## 2021-06-01 ENCOUNTER — Ambulatory Visit: Payer: Medicare Other | Admitting: Neurology

## 2021-06-10 ENCOUNTER — Ambulatory Visit: Payer: Medicare Other | Admitting: Cardiology

## 2021-06-15 NOTE — Progress Notes (Deleted)
Cardiology Office Note   Date:  06/15/2021   ID:  Danny Fuller, DOB 11-17-1975, MRN [id number]  PCP:  [name], MD  Cardiologist:   Minus Breeding, MD    No chief complaint on file.     History of Present Illness: Danny Fuller is a 46 y.o. male who was referred by Dr. Lorrene Reid years ago for evaluation of palpitations.   I saw him last year and he had chest pain.  He had a perfusion study without ischemia in Feb 2022.   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 He had a cardiac event monitor that demonstrated rare PVCs.  He had an echo with normal LV function although there was some moderate pulmonary HTN.   However, echo last year did not demonstrate pulmonary HTN.     He called the other day with an episode of chest pain.  He said the chest pain happened while he was watching TV.  There was left sided.  It was a throbbing ache.  He was clammy.  He was somewhat short of breath.  It was 10 out of 10.  There was some associated nausea.  It actually lasted probably 14 hours.  He went off to bed and when he woke up it was gone.  He refused to go to the emergency room.  He did take some Alka-Seltzer and had some improvement.  He has never had this before.  He otherwise has not had any recent problems.  He denies any palpitations, presyncope or syncope.  He has had no new chest pressure, neck or arm discomfort.  He has had no weight gain or edema.  He did have to have all of the toes of his left foot amputated since I last saw him because of nonhealing ulcers.  He has since checked his blood sugar much better under control.   Past Medical History:  Diagnosis Date   Asthma    Back pain    Bulging Disk   Chronic kidney disease    sees Dr. Jamal Maes     Diabetes mellitus    sees Dr. Dwyane Dee    Diabetic foot ulcers [facility])    sees Dr. Wylene Simmer    GERD  (gastroesophageal reflux disease)    Glaucoma    Headache(784.0)    Hyperlipidemia    Hypertension    sees Dr. Percival Spanish    Sleep apnea    CPAP    Past Surgical History:  Procedure Laterality Date   AMPUTATION Left 07/26/2015   Procedure: AMPUTATION LEFT FIFTH RAY;  Surgeon: [name], MD;  Location: [facility];  Service: Orthopedics;  Laterality: Left;   bilateral hip pins placed     INCISION AND DRAINAGE PERIRECTAL ABSCESS Right 03/07/2016   Procedure: IRRIGATION AND DEBRIDEMENT PERIRECTAL ABSCESS;  Surgeon: Alphonsa Overall, MD;  Location: [facility];  Service: General;  Laterality: Right;   INCISION AND DRAINAGE PERIRECTAL ABSCESS Right 03/09/2016   Procedure: EXAM UNDER ANESTHESIA, IRRIGATION AND DEBRIDEMENT PERIRECTAL ABSCESS;  Surgeon: Johnathan Hausen, MD;  Location: [facility];  Service: General;  Laterality: Right;  Open, betadine packed wound   TOE AMPUTATION Left 11/19/2019   Removal of all toes from left foot   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/20/2019   Procedure: TRANSMETATARSAL AMPUTATION LEFT FOOT;  Surgeon: Wylene Simmer, MD;  Location: [facility];  Service: Orthopedics;  Laterality: Left;     Current Outpatient Medications  Medication Sig Dispense Refill   amLODipine-benazepril (LOTREL) 10-40 MG capsule Take 1 capsule by mouth daily. 1 capsule 0   cloNIDine (CATAPRES - DOSED IN MG/24 HR) 0.2 mg/24hr patch Place 1 patch (0.2 mg total) onto the skin once a week. 4 patch 12   cyclobenzaprine (FLEXERIL) 10 MG tablet Take 1 tablet (10 mg total) by mouth 3 (three) times daily as needed for muscle spasms. 90 tablet 5   Dulaglutide (TRULICITY) 1.5 TI/1.4ER SOPN Trulicity 1.5 XV/4.0 mL subcutaneous pen injector     glucose blood test strip 1 each by Other route 4 (four) times daily.      insulin aspart (NOVOLOG) 100 UNIT/ML injection Use maximum of 0.6 mL/day in insulin pump 40 mL 2   Insulin Disposable Pump (OMNIPOD DASH PODS, GEN 4,) MISC INJECT 1 EACH INTO THE SKIN CONTINUOUS. APPLY 1 OMNIPOD  INSULIN PUMP TO BODY DAILY FOR INSULIN 30 each 1   Insulin Pen Needle 31G X 5 MM MISC 1 each by Other route daily.      labetalol (NORMODYNE) 200 MG tablet Take 1 tablet (200 mg total) by mouth 2 (two) times daily. 60 tablet 5   latanoprost (XALATAN) 0.005 % ophthalmic solution Place 1 drop into both eyes daily.     LYRICA 150 MG capsule Take 1 capsule (150 mg total) by mouth 3 (three) times daily. 90 capsule 5   nortriptyline (PAMELOR) 10 MG capsule TAKE 4 CAPSULES BY MOUTH AT BEDTIME 360 capsule 3   pravastatin (PRAVACHOL) 40 MG tablet Take 1 tablet (40 mg total) by mouth every evening. (Patient taking differently: Take 40 mg by mouth daily.) 90 tablet 3   traMADol (ULTRAM) 50 MG tablet Take 2 tablets (100 mg total) by mouth every 6 (six) hours as needed. 120 tablet 2   No current facility-administered medications for this visit.    Allergies:   Augmentin [amoxicillin-pot clavulanate] and Vancomycin   ROS:  Please see the history of present illness.   Otherwise, review of systems are positive for ***.   All other systems are reviewed and negative.    PHYSICAL EXAM: VS:  There were no vitals taken for this visit. , BMI There is no height or weight on file to calculate BMI.  GENERAL:  Well appearing NECK:  No jugular venous distention, waveform within normal limits, carotid upstroke brisk and symmetric, no bruits, no thyromegaly LUNGS:  Clear to auscultation bilaterally CHEST:  Unremarkable HEART:  PMI not displaced or sustained,S1 and S2 within normal limits, no S3, no S4, no clicks, no rubs, *** murmurs ABD:  Flat, positive bowel sounds normal in frequency in pitch, no bruits, no rebound, no guarding, no midline pulsatile mass, no hepatomegaly, no splenomegaly EXT:  2 plus pulses throughout, no edema, no cyanosis no clubbing    ***GENERAL:  Well appearing NECK:  No jugular venous distention, waveform within normal limits, carotid upstroke brisk and symmetric, no bruits, no  thyromegaly LUNGS:  Clear to auscultation bilaterally CHEST:  Unremarkable HEART:  PMI not displaced or sustained,S1 and S2 within normal limits, no S3, no S4, no clicks, no rubs, no murmurs ABD:  Flat, positive bowel sounds normal in frequency in pitch, no bruits, no rebound, no guarding, no midline pulsatile mass, no hepatomegaly, no splenomegaly EXT:  2 plus pulses throughout, no edema, no cyanosis no clubbing, missing all of his toes on his left foot   EKG:  EKG  is  *** ordered today. Sinus rhythm, rate ***, RAD, intervals within normal limits, no acute ST-T wave changes.  Recent Labs: No results found for requested labs within last 8760 hours.    Lipid Panel    Component Value Date/Time   CHOL 136 11/21/2019 0457   TRIG 132 11/21/2019 0457   HDL 24 (L) 11/21/2019 0457   CHOLHDL 5.7 11/21/2019 0457   VLDL 26 11/21/2019 0457   LDLCALC 86 11/21/2019 0457   LDLDIRECT 85.0 06/05/2019 0832      Wt Readings from Last 3 Encounters:  05/03/21 257 lb (116.6 kg)  12/31/20 257 lb (116.6 kg)  12/18/20 259 lb (117.5 kg)      Other studies Reviewed: Additional studies/ records that were reviewed today include:    *** Review of the above records demonstrates: **   ASSESSMENT AND PLAN:   CHEST PAIN:    ***  His chest pain was somewhat atypical but he has significant cardiovascular risk factors.  I will screen him with a  Lexiscan Myoview.    HTN:   The blood pressure is *** at target.  No change in therapy.   DM:    A1C is *** His blood sugar is *** being addressed actively.  His A1c is now down to 7.4 after having gotten higher than 11 recently.  DYSLIPIDEMIA:   His last LDL was *** 86 with an HDL of 24.  I will repeat this in the future and likely go up on his meds.   SLEEP APNEA:  ***   He had a study and had a prescription for CPAP.      Current medicines are reviewed at length with the patient today.  The patient does not have concerns regarding medicines.  The  following changes have been made:   None  Labs/ tests ordered today include:   No orders of the defined types were placed in this encounter.    Disposition:   FU with me an APP in 2 months.    Signed, Minus Breeding, MD  06/15/2021 8:15 PM    [facility]

## 2021-06-16 ENCOUNTER — Telehealth: Payer: Self-pay

## 2021-06-16 ENCOUNTER — Ambulatory Visit: Payer: Medicare Other | Admitting: Cardiology

## 2021-06-16 DIAGNOSIS — E785 Hyperlipidemia, unspecified: Secondary | ICD-10-CM

## 2021-06-16 DIAGNOSIS — R072 Precordial pain: Secondary | ICD-10-CM

## 2021-06-16 DIAGNOSIS — I10 Essential (primary) hypertension: Secondary | ICD-10-CM

## 2021-06-16 DIAGNOSIS — E118 Type 2 diabetes mellitus with unspecified complications: Secondary | ICD-10-CM

## 2021-06-16 NOTE — Telephone Encounter (Signed)
Called patient to see if he was going to attend his scheduled appointment.   His phone went to VM with mailbox being full.  Dr. Percival Spanish does not want him back on his schedule for numerous cancellations and no shows.  although he can still see a PA.   ?

## 2021-06-20 DIAGNOSIS — E1142 Type 2 diabetes mellitus with diabetic polyneuropathy: Secondary | ICD-10-CM | POA: Diagnosis not present

## 2021-06-21 ENCOUNTER — Encounter: Payer: Self-pay | Admitting: Cardiology

## 2021-07-25 DIAGNOSIS — E1140 Type 2 diabetes mellitus with diabetic neuropathy, unspecified: Secondary | ICD-10-CM | POA: Diagnosis not present

## 2021-07-25 DIAGNOSIS — Z89439 Acquired absence of unspecified foot: Secondary | ICD-10-CM | POA: Diagnosis not present

## 2021-07-25 DIAGNOSIS — M79672 Pain in left foot: Secondary | ICD-10-CM | POA: Diagnosis not present

## 2021-07-25 DIAGNOSIS — M79671 Pain in right foot: Secondary | ICD-10-CM | POA: Diagnosis not present

## 2021-07-25 DIAGNOSIS — L84 Corns and callosities: Secondary | ICD-10-CM | POA: Diagnosis not present

## 2021-09-13 NOTE — Progress Notes (Deleted)
Follow-up Visit   Date: 09/13/21    Danny Fuller MRN: [id number] DOB: Mar 04, 1975   Interim History: Danny Fuller is a 46 y.o. 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  The patient was accompanied to the clinic by self.  His neuropathy is stable.  He is compliant with Lyrica '150mg'$  three times daily and nortriptyline '40mg'$  at bedtime which controls pain adequately.  He underwent left ulnar decompression in August and feels that his hand strength has improved some.  He is driving autoparts at night time and was drinking energy drinks last night to keep him awake.  His BP is very high this morning, he denies chest pain, shortness of breath, or dizziness. He feels that his balance is getting worse.  He walks unassisted.  He had a fall earlier this year.     Medications:  Current Outpatient Medications on File Prior to Visit  Medication Sig Dispense Refill   amLODipine-benazepril (LOTREL) 10-40 MG capsule Take 1 capsule by mouth daily. 1 capsule 0   cloNIDine (CATAPRES - DOSED IN MG/24 HR) 0.2 mg/24hr patch Place 1 patch (0.2 mg total) onto the skin once a week. 4 patch 12   cyclobenzaprine (FLEXERIL) 10 MG tablet Take 1 tablet (10 mg total) by mouth 3 (three) times daily as needed for muscle spasms. 90 tablet 5   Dulaglutide (TRULICITY) 1.5 GB/1.5VV SOPN Trulicity 1.5 OH/6.0 mL subcutaneous pen injector     glucose blood test strip 1 each by Other route 4 (four) times daily.      insulin aspart (NOVOLOG) 100 UNIT/ML injection Use maximum of 0.6 mL/day in insulin pump 40 mL 2   Insulin Disposable Pump (OMNIPOD DASH PODS, GEN 4,) MISC INJECT 1 EACH INTO THE SKIN CONTINUOUS. APPLY 1 OMNIPOD INSULIN PUMP TO BODY DAILY FOR INSULIN 30 each 1   Insulin Pen Needle 31G X 5 MM MISC 1 each by Other route daily.      labetalol (NORMODYNE) 200 MG  tablet Take 1 tablet (200 mg total) by mouth 2 (two) times daily. 60 tablet 5   latanoprost (XALATAN) 0.005 % ophthalmic solution Place 1 drop into both eyes daily.     LYRICA 150 MG capsule Take 1 capsule (150 mg total) by mouth 3 (three) times daily. 90 capsule 5   nortriptyline (PAMELOR) 10 MG capsule TAKE 4 CAPSULES BY MOUTH AT BEDTIME 360 capsule 3   pravastatin (PRAVACHOL) 40 MG tablet Take 1 tablet (40 mg total) by mouth every evening. (Patient taking differently: Take 40 mg by mouth daily.) 90 tablet 3   traMADol (ULTRAM) 50 MG tablet Take 2 tablets (100 mg total) by mouth every 6 (six) hours as needed. 120 tablet 2   No current facility-administered medications on file prior to visit.    Allergies:  Allergies  Allergen Reactions   Augmentin [Amoxicillin-Pot Clavulanate] Itching and Other (See Comments)    Dizziness   Vancomycin Other (See Comments)    AKI    Vital Signs:  There were no vitals taken for this visit.  Neurological Exam: MENTAL STATUS including orientation to time, place, person, recent and remote memory, attention span and concentration, language, and fund of knowledge is normal.  Speech is not dysarthric.  CRANIAL NERVES:  Normal conjugate, extra-ocular eye movements in all directions of gaze.  No ptosis.  MOTOR:  Motor strength is 5/5 in all extremities,  except left finger abductors 4/5, right toe extensors and flexors 4/5. s/p left mid-foot amputation  MSRs:  Reflexes are 2+/4 in the upper extremities, 1+ at the left patella, and absent left patella and ankles  SENSORY:  Vibration is absent below the ankles  COORDINATION/GAIT:  Gait appears mildly wide-based, stable.  Unsteady with tandem gait.    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  Overall, these findings are severe in degree  electrically. 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  NCS/EMG of the upper extremities 07/31/2017:  The electrophysiologic findings are most suggestive of a sensorimotor polyradiculoneuropathy affecting the upper extremities, moderate in degree electrically, which is new when compared to his previous study dated 12/31/2016. Alternatively, progression of sensorimotor demyelinating and axonal polyneuropathy with a superimposed ulnar neuropathy across the elbow (worse on the left), cannot be excluded. Correlate clinically.  MRI lumbar spine wo contrast 08/22/2017: 1. 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 The left foramen also appears mildly narrowed. 2. Mild left foraminal narrowing at L5-S1 without definite nerve root encroachment. 3. No other significant acquired disc space findings. Congenitally short pedicles.  Labs 04/21/2020:  HbA1c 11 > 7.0   Lab Results  Component Value Date   CREATININE 1.15 12/30/2019   BUN 13 12/30/2019   NA 137 12/30/2019   K 4.2 12/30/2019   CL 105 12/30/2019   CO2 23 12/30/2019     IMPRESSION/PLAN: 1. 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  He has been able to reduce insulin.  Neuropathy is stable.  - Continue Lyrica '150mg'$  TID  - Continue nortriptyline '40mg'$  at bedtime  - Patient educated on daily foot inspection, fall prevention, and safety precautions around the home.  - Refer to PT for balance training ***   2. 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 Resistant hypertension, asymptomatic  - He is scheduled to see his PCP next week  Return to clinic in ***  Total time spent reviewing records, interview, history/exam,  documentation, and coordination of care on day of encounter:  *** min       Thank you for allowing me to participate in patient's care.  If I can answer any additional questions, I would be pleased to do so.    Sincerely,    Yamilet Mcfayden K. Posey Pronto, DO

## 2021-09-14 ENCOUNTER — Encounter: Payer: Self-pay | Admitting: Neurology

## 2021-09-14 ENCOUNTER — Ambulatory Visit: Payer: Medicare Other | Admitting: Neurology

## 2021-09-14 DIAGNOSIS — Z029 Encounter for administrative examinations, unspecified: Secondary | ICD-10-CM

## 2021-09-28 ENCOUNTER — Ambulatory Visit: Payer: Medicare Other | Admitting: Family Medicine

## 2021-09-28 ENCOUNTER — Encounter: Payer: Self-pay | Admitting: Family Medicine

## 2021-09-28 ENCOUNTER — Ambulatory Visit (INDEPENDENT_AMBULATORY_CARE_PROVIDER_SITE_OTHER): Payer: Medicare Other | Admitting: Family Medicine

## 2021-09-28 VITALS — BP 152/110 | HR 94 | Temp 98.1°F | Ht 71.0 in | Wt 241.0 lb

## 2021-09-28 DIAGNOSIS — Z0000 Encounter for general adult medical examination without abnormal findings: Secondary | ICD-10-CM

## 2021-09-28 LAB — LIPID PANEL
Cholesterol: 148 mg/dL (ref 0–200)
HDL: 34 mg/dL — ABNORMAL LOW (ref >39.00)
LDL Cholesterol: 78 mg/dL (ref 0–99)
NonHDL: 114.31
Total CHOL/HDL Ratio: 4
Triglycerides: 182 mg/dL — ABNORMAL HIGH (ref 0.0–149.0)
VLDL: 36.4 mg/dL (ref 0.0–40.0)

## 2021-09-28 LAB — CBC WITH DIFFERENTIAL/PLATELET
Basophils Absolute: 0.1 10*3/uL (ref 0.0–0.1)
Basophils Relative: 0.8 % (ref 0.0–3.0)
Eosinophils Absolute: 0.1 10*3/uL (ref 0.0–0.7)
Eosinophils Relative: 1.3 % (ref 0.0–5.0)
HCT: 47.6 % (ref 39.0–52.0)
Hemoglobin: 16 g/dL (ref 13.0–17.0)
Lymphocytes Relative: 39.9 % (ref 12.0–46.0)
Lymphs Abs: 3.3 10*3/uL (ref 0.7–4.0)
MCHC: 33.6 g/dL (ref 30.0–36.0)
MCV: 91.9 fl (ref 78.0–100.0)
Monocytes Absolute: 0.5 10*3/uL (ref 0.1–1.0)
Monocytes Relative: 6 % (ref 3.0–12.0)
Neutro Abs: 4.3 10*3/uL (ref 1.4–7.7)
Neutrophils Relative %: 52 % (ref 43.0–77.0)
Platelets: 292 10*3/uL (ref 150.0–400.0)
RBC: 5.17 Mil/uL (ref 4.22–5.81)
RDW: 12.8 % (ref 11.5–15.5)
WBC: 8.3 10*3/uL (ref 4.0–10.5)

## 2021-09-28 LAB — HEPATIC FUNCTION PANEL
ALT: 26 U/L (ref 0–53)
AST: 16 U/L (ref 0–37)
Albumin: 4.1 g/dL (ref 3.5–5.2)
Alkaline Phosphatase: 73 U/L (ref 39–117)
Bilirubin, Direct: 0.1 mg/dL (ref 0.0–0.3)
Total Bilirubin: 0.5 mg/dL (ref 0.2–1.2)
Total Protein: 7.1 g/dL (ref 6.0–8.3)

## 2021-09-28 LAB — HEMOGLOBIN A1C: Hgb A1c MFr Bld: 10 % — ABNORMAL HIGH (ref 4.6–6.5)

## 2021-09-28 LAB — TSH: TSH: 1.23 u[IU]/mL (ref 0.35–5.50)

## 2021-09-28 LAB — BASIC METABOLIC PANEL
BUN: 20 mg/dL (ref 6–23)
CO2: 26 mEq/L (ref 19–32)
Calcium: 9.5 mg/dL (ref 8.4–10.5)
Chloride: 102 mEq/L (ref 96–112)
Creatinine, Ser: 1.2 mg/dL (ref 0.40–1.50)
GFR: 73.7 mL/min (ref >60.00)
Glucose, Bld: 197 mg/dL — ABNORMAL HIGH (ref 70–99)
Potassium: 4.5 mEq/L (ref 3.5–5.1)
Sodium: 136 mEq/L (ref 135–145)

## 2021-09-28 LAB — PSA: PSA: 0.83 ng/mL (ref 0.10–4.00)

## 2021-09-28 MED ORDER — CYCLOBENZAPRINE HCL 10 MG PO TABS: 10.0000 mg | ORAL_TABLET | Freq: Three times a day (TID) | ORAL | 5 refills | Status: DC | PRN

## 2021-09-28 MED ORDER — LABETALOL HCL 200 MG PO TABS: 200.0000 mg | ORAL_TABLET | Freq: Two times a day (BID) | ORAL | 3 refills | Status: DC

## 2021-09-28 MED ORDER — CLONIDINE 0.2 MG/24HR TD PTWK: 0.2000 mg | MEDICATED_PATCH | TRANSDERMAL | 3 refills | Status: DC

## 2021-09-28 MED ORDER — AMLODIPINE BESY-BENAZEPRIL HCL 10-40 MG PO CAPS: 1.0000 | ORAL_CAPSULE | Freq: Every day | ORAL | 3 refills | Status: DC

## 2021-09-28 MED ORDER — PRAVASTATIN SODIUM 40 MG PO TABS: 40.0000 mg | ORAL_TABLET | Freq: Every evening | ORAL | 3 refills | Status: DC

## 2021-09-28 NOTE — Progress Notes (Signed)
Subjective:    Patient ID: Danny Fuller, male    DOB: December 10, 1975, 46 y.o.   MRN: [id number]  HPI Here for a well exam. He feels well. He ran out of BP medications last week, so of course his BP is high today. He sees [name] NP at [facility] for his diabetes.    Review of Systems  Constitutional: Negative.   HENT: Negative.    Eyes: Negative.   Respiratory: Negative.    Cardiovascular: Negative.   Gastrointestinal: Negative.   Genitourinary: Negative.   Musculoskeletal: Negative.   Skin: Negative.   Neurological: Negative.   Psychiatric/Behavioral: Negative.         Objective:   Physical Exam Constitutional:      General: He is not in acute distress.    Appearance: Normal appearance. He is well-developed. He is not diaphoretic.  HENT:     Head: Normocephalic and atraumatic.     Right Ear: External ear normal.     Left Ear: External ear normal.     Nose: Nose normal.     Mouth/Throat:     Pharynx: No oropharyngeal exudate.  Eyes:     General: No scleral icterus.       Right eye: No discharge.        Left eye: No discharge.     Conjunctiva/sclera: Conjunctivae normal.     Pupils: Pupils are equal, round, and reactive to light.  Neck:     Thyroid: No thyromegaly.     Vascular: No JVD.     Trachea: No tracheal deviation.  Cardiovascular:     Rate and Rhythm: Normal rate and regular rhythm.     Heart sounds: Normal heart sounds. No murmur heard.    No friction rub. No gallop.  Pulmonary:     Effort: Pulmonary effort is normal. No respiratory distress.     Breath sounds: Normal breath sounds. No wheezing or rales.  Chest:     Chest wall: No tenderness.  Abdominal:     General: Bowel sounds are normal. There is no distension.     Palpations: Abdomen is soft. There is no mass.     Tenderness: There is no abdominal tenderness. There is no guarding or rebound.  Genitourinary:    Penis: Normal. No tenderness.      Testes: Normal.     Prostate: Normal.      Rectum: Normal. Guaiac result negative.  Musculoskeletal:        General: No tenderness. Normal range of motion.     Cervical back: Neck supple.  Lymphadenopathy:     Cervical: No cervical adenopathy.  Skin:    General: Skin is warm and dry.     Coloration: Skin is not pale.     Findings: No erythema or rash.  Neurological:     Mental Status: He is alert and oriented to person, place, and time.     Cranial Nerves: No cranial nerve deficit.     Motor: No abnormal muscle tone.     Coordination: Coordination normal.     Deep Tendon Reflexes: Reflexes are normal and symmetric. Reflexes normal.  Psychiatric:        Behavior: Behavior normal.        Thought Content: Thought content normal.        Judgment: Judgment normal.           Assessment & Plan:  Well exam. We discussed diet and exercise. Get fasting labs. [name], MD

## 2021-10-21 ENCOUNTER — Emergency Department (HOSPITAL_COMMUNITY)
Admission: EM | Admit: 2021-10-21 | Discharge: 2021-10-21 | Disposition: A | Discharge status: Home / Self Care | Payer: Medicare Other | Attending: Emergency Medicine | Admitting: Emergency Medicine

## 2021-10-21 ENCOUNTER — Encounter (HOSPITAL_COMMUNITY): Payer: Self-pay

## 2021-10-21 ENCOUNTER — Emergency Department (HOSPITAL_COMMUNITY): Payer: Medicare Other

## 2021-10-21 ENCOUNTER — Other Ambulatory Visit (HOSPITAL_COMMUNITY): Payer: Self-pay

## 2021-10-21 DIAGNOSIS — Z794 Long term (current) use of insulin: Secondary | ICD-10-CM | POA: Diagnosis not present

## 2021-10-21 DIAGNOSIS — I4891 Unspecified atrial fibrillation: Secondary | ICD-10-CM | POA: Insufficient documentation

## 2021-10-21 DIAGNOSIS — Z7901 Long term (current) use of anticoagulants: Secondary | ICD-10-CM | POA: Diagnosis not present

## 2021-10-21 DIAGNOSIS — I10 Essential (primary) hypertension: Secondary | ICD-10-CM | POA: Insufficient documentation

## 2021-10-21 DIAGNOSIS — Z79899 Other long term (current) drug therapy: Secondary | ICD-10-CM | POA: Diagnosis not present

## 2021-10-21 DIAGNOSIS — R079 Chest pain, unspecified: Secondary | ICD-10-CM | POA: Diagnosis not present

## 2021-10-21 LAB — TSH: TSH: 1.515 u[IU]/mL (ref 0.350–4.500)

## 2021-10-21 LAB — CBC
HCT: 50.2 % (ref 39.0–52.0)
Hemoglobin: 16.9 g/dL (ref 13.0–17.0)
MCH: 30.7 pg (ref 26.0–34.0)
MCHC: 33.7 g/dL (ref 30.0–36.0)
MCV: 91.1 fL (ref 80.0–100.0)
Platelets: 371 10*3/uL (ref 150–400)
RBC: 5.51 MIL/uL (ref 4.22–5.81)
RDW: 12.6 % (ref 11.5–15.5)
WBC: 9.5 10*3/uL (ref 4.0–10.5)
nRBC: 0 % (ref 0.0–0.2)

## 2021-10-21 LAB — BASIC METABOLIC PANEL
Anion gap: 9 (ref 5–15)
BUN: 15 mg/dL (ref 6–20)
CO2: 23 mmol/L (ref 22–32)
Calcium: 9.4 mg/dL (ref 8.9–10.3)
Chloride: 106 mmol/L (ref 98–111)
Creatinine, Ser: 1.29 mg/dL — ABNORMAL HIGH (ref 0.61–1.24)
GFR, Estimated: >60 mL/min (ref >60)
Glucose, Bld: 154 mg/dL — ABNORMAL HIGH (ref 70–99)
Potassium: 4 mmol/L (ref 3.5–5.1)
Sodium: 138 mmol/L (ref 135–145)

## 2021-10-21 LAB — TROPONIN I (HIGH SENSITIVITY)
Troponin I (High Sensitivity): 7 ng/L (ref <18)
Troponin I (High Sensitivity): 6 ng/L (ref <18)

## 2021-10-21 LAB — MAGNESIUM: Magnesium: 1.9 mg/dL (ref 1.7–2.4)

## 2021-10-21 MED ORDER — APIXABAN 5 MG PO TABS
5.0000 mg | ORAL_TABLET | Freq: Two times a day (BID) | ORAL | 0 refills | Status: DC
  Filled 2021-10-21: qty 60, 30d supply, fill #0

## 2021-10-21 MED ORDER — LABETALOL HCL 200 MG PO TABS
300.0000 mg | ORAL_TABLET | Freq: Once | ORAL | Status: AC
Start: 2021-10-21 — End: 2021-10-21
  Administered 2021-10-21: 300 mg via ORAL
  Filled 2021-10-21: qty 2

## 2021-10-21 MED ORDER — APIXABAN 5 MG PO TABS
5.0000 mg | ORAL_TABLET | Freq: Two times a day (BID) | ORAL | Status: DC
  Administered 2021-10-21: 5 mg via ORAL
  Filled 2021-10-21: qty 1

## 2021-10-21 MED ORDER — METOPROLOL TARTRATE 25 MG PO TABS
25.0000 mg | ORAL_TABLET | Freq: Once | ORAL | Status: AC
  Administered 2021-10-21: 25 mg via ORAL
  Filled 2021-10-21: qty 1

## 2021-10-21 NOTE — ED Provider Triage Note (Signed)
Emergency Medicine Provider Triage Evaluation Note  Danny Fuller , a 46 y.o. male  was evaluated in triage.  Pt complains of sudden onset chest pain and some shortness of breath and hour ago.  Unprovoked.  Chest pain described more as uncomfortable sensations with palpitations.  Located centrally, changes location slightly when breathing.  Denies radiation of pain elsewhere.  Feels fatigued and mildly lightheaded, feels as if he could go to sleep right now.  "Hard to keep my eyes open".  Denies fever, N/V/D, abdominal pain.  Hx DMT2, HTN, hyperlipidemia.  Review of Systems  Positive:  Negative: See above  Physical Exam  BP (!) 126/92 (BP Location: Right Arm)   Pulse 71   Temp 98.2 F (36.8 C) (Oral)   Resp 18   SpO2 98%  Gen:   Awake, no distress, appears fatigued Resp:  Normal effort  MSK:   Moves extremities without difficulty  Other:  Irregularly irregular rhythm, varying between 80 to 140 bpm  Medical Decision Making  Medically screening exam initiated at 11:16 AM.  Appropriate orders placed.  [name] was informed that the remainder of the evaluation will be completed by another provider, this initial triage assessment does not replace that evaluation, and the importance of remaining in the ED until their evaluation is complete.  Discussed patient with triage nurse, plan to be brought back to the next available room for possible new onset A-fib   Prince Rome, Hershal Coria 17/00/17 1119

## 2021-10-21 NOTE — ED Provider Notes (Signed)
[facility] EMERGENCY DEPARTMENT Provider Note   CSN: [id number] Arrival date & time: 10/21/21  1035     History  Chief Complaint  Patient presents with   Palpitations   Chest Pain    Danny Fuller is a 46 y.o. male.  HPI     46 year old male with history of hypertension, hyperlipidemia, obstructive sleep apnea comes in with chief complaint of chest pain.  Patient states that he went to bed feeling well and he woke up this morning feeling well.  Around 9:30 AM, he started having chest pain described as pressure type feeling.  He also Started feeling profoundly weak and fatigued.  Patient can feel palpitations, like his heart is racing.  He has no history of similar symptoms in the past.  Patient denies any substance use disorder, heavy alcohol consumption, history of blood clots in legs, lungs or risk factors associated with those diagnoses such as cancer, long distance travels or recent surgery.  Patient denies having palpitations in the past and has not had any chest pain, shortness of breath or weakness with minimal exertion.  Home Medications Prior to Admission medications   Medication Sig Start Date End Date Taking? Authorizing Provider  apixaban (ELIQUIS) 5 MG TABS tablet Take 1 tablet (5 mg total) by mouth 2 (two) times daily. 10/21/21 11/20/21 Yes Tierre Gerard, MD  amLODipine-benazepril (LOTREL) 10-40 MG capsule Take 1 capsule by mouth daily. 09/28/21   [name], MD  cloNIDine (CATAPRES - DOSED IN MG/24 HR) 0.2 mg/24hr patch Place 1 patch (0.2 mg total) onto the skin once a week. 09/28/21   [name], MD  cyclobenzaprine (FLEXERIL) 10 MG tablet Take 1 tablet (10 mg total) by mouth 3 (three) times daily as needed for muscle spasms. 09/28/21   [name], MD  Dulaglutide (TRULICITY) 1.5 FT/7.3UK [facility] Trulicity 1.5 GU/5.4 mL subcutaneous pen injector 08/03/20   [provider]  glucose blood test strip 1 each by Other route 4 (four)  times daily.     [provider]  insulin aspart (NOVOLOG) 100 UNIT/ML injection Use maximum of 0.6 mL/day in insulin pump 12/23/19   [name], MD  Insulin Disposable Pump (OMNIPOD DASH PODS, GEN 4,) MISC INJECT 1 EACH INTO THE SKIN CONTINUOUS. APPLY 1 OMNIPOD INSULIN PUMP TO BODY DAILY FOR INSULIN 06/25/20   [name], MD  Insulin Pen Needle 31G X 5 MM MISC 1 each by Other route daily.  08/07/16   [provider]  labetalol (NORMODYNE) 200 MG tablet Take 1 tablet (200 mg total) by mouth 2 (two) times daily. 09/28/21   [name], MD  latanoprost (XALATAN) 0.005 % ophthalmic solution Place 1 drop into both eyes daily. 10/24/19   [provider]  LYRICA 150 MG capsule Take 1 capsule (150 mg total) by mouth 3 (three) times daily. 11/02/17   Patel, Arvin Collard K, DO  nortriptyline (PAMELOR) 10 MG capsule TAKE 4 CAPSULES BY MOUTH AT BEDTIME 05/31/21   Patel, Donika K, DO  pravastatin (PRAVACHOL) 40 MG tablet Take 1 tablet (40 mg total) by mouth every evening. 09/28/21   [name], MD  traMADol (ULTRAM) 50 MG tablet Take 2 tablets (100 mg total) by mouth every 6 (six) hours as needed. 09/10/20   [name], MD      Allergies    Augmentin [amoxicillin-pot clavulanate] and Vancomycin    Review of Systems   Review of Systems  All other systems reviewed and  are negative.   Physical Exam Updated Vital Signs BP (!) 141/90   Pulse 83   Temp 98 F (36.7 C) (Oral)   Resp 14   Ht '5\' 11"'$  (1.803 m)   Wt 116.1 kg   SpO2 100%   BMI 35.70 kg/m  Physical Exam Vitals and nursing note reviewed.  Constitutional:      Appearance: He is well-developed.  HENT:     Head: Atraumatic.  Cardiovascular:     Rate and Rhythm: Tachycardia present. Rhythm irregular.  Pulmonary:     Effort: Pulmonary effort is normal.  Musculoskeletal:     Cervical back: Neck supple.  Skin:    General: Skin is warm.  Neurological:     Mental Status: He is alert and oriented to person, place,  and time.     ED Results / Procedures / Treatments   Labs (all labs ordered are listed, but only abnormal results are displayed) Labs Reviewed  BASIC METABOLIC PANEL - Abnormal; Notable for the following components:      Result Value   Glucose, Bld 154 (*)    Creatinine, Ser 1.29 (*)    All other components within normal limits  CBC  TSH  MAGNESIUM  TROPONIN I (HIGH SENSITIVITY)  TROPONIN I (HIGH SENSITIVITY)    EKG EKG Interpretation  Date/Time:  Friday October 21 2021 10:43:16 EDT Ventricular Rate:  141 PR Interval:    QRS Duration: 84 QT Interval:  250 QTC Calculation: 382 R Axis:   108 Text Interpretation: Atrial fibrillation with rapid ventricular response Rightward axis Nonspecific ST abnormality Abnormal QRS-T angle, consider primary T wave abnormality Abnormal ECG When compared with ECG of 03-Apr-2018 20:45, PREVIOUS ECG IS PRESENT afib is new Confirmed by Varney Biles [phone]) on 10/21/2021 11:58:29 AM  Radiology DG Chest 2 View  Result Date: 10/21/2021 CLINICAL DATA:  Chest pain EXAM: CHEST - 2 VIEW COMPARISON:  Chest x-ray Apr 03, 2018 FINDINGS: Low lung volumes. Streaky bibasilar opacities. No confluent consolidation. No visible pleural effusions or pneumothorax. Cardiomediastinal silhouette is within normal limits. IMPRESSION: Streaky bibasilar opacities, favor atelectasis. Electronically Signed   By: Margaretha Sheffield M.D.   On: 10/21/2021 11:39    Procedures .Critical Care  Performed by: Varney Biles, MD Authorized by: Varney Biles, MD   Critical care provider statement:    Critical care time (minutes):  36   Critical care was necessary to treat or prevent imminent or life-threatening deterioration of the following conditions:  Circulatory failure   Critical care was time spent personally by me on the following activities:  Development of treatment plan with patient or surrogate, discussions with consultants, evaluation of patient's response to  treatment, examination of patient, ordering and review of laboratory studies, ordering and review of radiographic studies, ordering and performing treatments and interventions, pulse oximetry, re-evaluation of patient's condition and review of old charts     Medications Ordered in ED Medications  apixaban (ELIQUIS) tablet 5 mg (5 mg Oral Given 10/21/21 1355)  metoprolol tartrate (LOPRESSOR) tablet 25 mg (25 mg Oral Given 10/21/21 1334)  labetalol (NORMODYNE) tablet 300 mg (300 mg Oral Given 10/21/21 1504)    ED Course/ Medical Decision Making/ A&P Clinical Course as of 10/21/21 1609  Fri Oct 21, 2021  1437 Patient converted to sinus rhythm spontaneously.  Initial lab work-up is reassuring.  Repeat EKG shows sinus tachycardia with heart rate 101.  Initial high-sensitivity troponin is normal.  Repeat is pending at this time.  Case discussed with Dr.  Nishan, cardiology.  He recommends increasing patient's labetalol to 300 mg twice daily followed by outpatient follow-up with cardiology.  Patient agreeable with the plan. [AN]    Clinical Course User Index [AN] Varney Biles, MD       CHA2DS2-VASc Score: 2                    Medical Decision Making Amount and/or Complexity of Data Reviewed Labs: ordered. Radiology: ordered.  Risk Prescription drug management.   This patient presents to the ED with chief complaint(s) of chest pain, palpitations, weakness with pertinent past medical history of hypertension, hyperlipidemia, OSA.The complaint involves an extensive differential diagnosis and also carries with it a high risk of complications and morbidity.    Patient noted to be in A-fib with RVR.  This appears to be new onset atrial fibrillation for him.  Mali VASc score is 2  The differential diagnosis includes A-fib secondary to OSA, longstanding hypertension, electrolyte abnormality, hyperthyroidism  The initial plan is to get basic labs.  It appears that patient's symptoms just  started today.  It also appears that he has not been going in and out of A-fib as he has never had palpitations, exertional chest pain, shortness of breath, weakness or dizziness that he can recall.  As such, patient is a candidate for cardioversion and rhythm control.  I have discussed both rate control and rhythm control strategies with the patient.  I have discussed with him that acutely, neither strategies have shown to be superior, however in the long run being in sinus rhythm is considered to be superior than being in A-fib given the remodeling of the heart that occurs with A-fib.  Patient also made aware that either way he will need to be on blood thinners to reduce risk for stroke.  Patient is going to make his decision and 15 to 30 minutes whether to proceed with cardioversion or not.   Additional history obtained: Records reviewed  previous cardiology notes  Independent labs interpretation:  The following labs were independently interpreted: CBC, metabolic profile is normal and reassuring.  Independent visualization and interpretation of imaging: - I independently visualized the following imaging with scope of interpretation limited to determining acute life threatening conditions related to emergency care: X-ray of the chest, which revealed no evidence of pulmonary edema  Treatment and Reassessment: Patient reassessed.  Back in sinus tachycardia.  Labetalol oral ordered. Eliquis counseling completed by pharmacy. Final Clinical Impression(s) / ED Diagnoses Final diagnoses:  Atrial fibrillation with RVR ([place])    Rx / DC Orders ED Discharge Orders          Ordered    Amb referral to AFIB Clinic        10/21/21 1212    apixaban (ELIQUIS) 5 MG TABS tablet  2 times daily        10/21/21 1447              Varney Biles, MD 10/21/21 1610

## 2021-10-21 NOTE — ED Triage Notes (Signed)
Pt states approximately 45 min PTA he began having "faint" midsternal non-radiating CP with palpitations. Pt denies hx of afib.

## 2021-10-21 NOTE — Discharge Instructions (Addendum)
You are seen in the ER for new onset of atrial fibrillation.  This is a kind of arrhythmia that puts you at risk for stroke.   In order to reduce the risk for stroke, we will start you on a blood thinner called Eliquis.  Please read instructions on that medication.  Additionally, your labetalol has to be increased from 200 mg to 300 mg twice a day.  Finally, you will have to follow-up with the A-fib clinic.  They will likely give you a call to set up an appointment, if they do not call you by Monday afternoon, then call the number provided to set up an appointment in 7 to 10 days.  Return to the emergency room if your symptoms get worse.  Information on my medicine - ELIQUIS (apixaban)  This medication education was reviewed with me or my healthcare representative as part of my discharge preparation.  The pharmacist that spoke with me during my hospital stay was:  Heloise Purpura, [facility]  Why was Eliquis prescribed for you? Eliquis was prescribed for you to reduce the risk of a blood clot forming that can cause a stroke if you have a medical condition called atrial fibrillation (a type of irregular heartbeat).  What do You need to know about Eliquis ? Take your Eliquis TWICE DAILY - one tablet in the morning and one tablet in the evening with or without food. If you have difficulty swallowing the tablet whole please discuss with your pharmacist how to take the medication safely.  Take Eliquis exactly as prescribed by your doctor and DO NOT stop taking Eliquis without talking to the doctor who prescribed the medication.  Stopping may increase your risk of developing a stroke.  Refill your prescription before you run out.  After discharge, you should have regular check-up appointments with your healthcare provider that is prescribing your Eliquis.  In the future your dose may need to be changed if your kidney function or weight changes by a significant amount or as you get older.  What  do you do if you miss a dose? If you miss a dose, take it as soon as you remember on the same day and resume taking twice daily.  Do not take more than one dose of ELIQUIS at the same time to make up a missed dose.  Important Safety Information A possible side effect of Eliquis is bleeding. You should call your healthcare provider right away if you experience any of the following: Bleeding from an injury or your nose that does not stop. Unusual colored urine (red or dark brown) or unusual colored stools (red or black). Unusual bruising for unknown reasons. A serious fall or if you hit your head (even if there is no bleeding).  Some medicines may interact with Eliquis and might increase your risk of bleeding or clotting while on Eliquis. To help avoid this, consult your healthcare provider or pharmacist prior to using any new prescription or non-prescription medications, including herbals, vitamins, non-steroidal anti-inflammatory drugs (NSAIDs) and supplements.  This website has more information on Eliquis (apixaban): http://www.eliquis.com/eliquis/home

## 2021-10-21 NOTE — Progress Notes (Signed)
ANTICOAGULATION CONSULT NOTE - Initial Consult  Pharmacy Consult for apixaban Indication: atrial fibrillation  Allergies  Allergen Reactions   Augmentin [Amoxicillin-Pot Clavulanate] Itching and Other (See Comments)    Dizziness   Vancomycin Other (See Comments)    AKI    Patient Measurements: Height: '5\' 11"'$  (180.3 cm) Weight: 116.1 kg (256 lb) IBW/kg (Calculated) : 75.3  Vital Signs: Temp: 98.3 F (36.8 C) (09/08 1151) Temp Source: Oral (09/08 1151) BP: 142/93 (09/08 1330) Pulse Rate: 103 (09/08 1330)  Labs: Recent Labs    10/21/21 1118  HGB 16.9  HCT 50.2  PLT 371  CREATININE 1.29*  TROPONINIHS 7    Estimated Creatinine Clearance: 94.7 mL/min (A) (by C-G formula based on SCr of 1.29 mg/dL (H)).   Medical History: Past Medical History:  Diagnosis Date   Asthma    Back pain    Bulging Disk   Chronic kidney disease    sees Dr. Jamal Maes     Diabetes mellitus    sees [name] NP at [facility]   Diabetic foot ulcers [facility])    sees Dr. Wylene Simmer    GERD (gastroesophageal reflux disease)    Glaucoma    Headache(784.0)    Hyperlipidemia    Hypertension    sees Dr. Percival Spanish    Sleep apnea    CPAP    Medications:  (Not in a hospital admission)  Scheduled:   apixaban  5 mg Oral BID   Infusions:   Assessment: 48 yom pmh of HTN< HLD, OSA w/ cc of cp. Found to be in AF RVR in th ED.  CBC ok SCr 1.29  Goal of Therapy:  Monitor platelets by anticoagulation protocol: Yes   Plan:  START apixaban '5mg'$  BID prior to patient DC from ED Apixaban prescription sent to [facility] and delivered to patient's bedside by this pharmacist Education provided to patient prior to d/c  Heloise Purpura 10/21/2021,1:44 PM

## 2021-10-26 DIAGNOSIS — L84 Corns and callosities: Secondary | ICD-10-CM | POA: Diagnosis not present

## 2021-10-26 DIAGNOSIS — E13621 Other specified diabetes mellitus with foot ulcer: Secondary | ICD-10-CM | POA: Diagnosis not present

## 2021-10-27 ENCOUNTER — Encounter (HOSPITAL_COMMUNITY): Payer: Self-pay | Admitting: Physician Assistant

## 2021-10-27 ENCOUNTER — Ambulatory Visit (HOSPITAL_COMMUNITY)
Admission: RE | Admit: 2021-10-27 | Discharge: 2021-10-27 | Disposition: A | Discharge status: Home / Self Care | Payer: Medicare Other | Source: Ambulatory Visit | Attending: Physician Assistant | Admitting: Physician Assistant

## 2021-10-27 VITALS — BP 160/100 | HR 88 | Ht 71.0 in | Wt 251.2 lb

## 2021-10-27 DIAGNOSIS — Z7901 Long term (current) use of anticoagulants: Secondary | ICD-10-CM | POA: Diagnosis not present

## 2021-10-27 DIAGNOSIS — D6869 Other thrombophilia: Secondary | ICD-10-CM | POA: Diagnosis not present

## 2021-10-27 DIAGNOSIS — Z6835 Body mass index (BMI) 35.0-35.9, adult: Secondary | ICD-10-CM | POA: Insufficient documentation

## 2021-10-27 DIAGNOSIS — E1122 Type 2 diabetes mellitus with diabetic chronic kidney disease: Secondary | ICD-10-CM | POA: Diagnosis not present

## 2021-10-27 DIAGNOSIS — N189 Chronic kidney disease, unspecified: Secondary | ICD-10-CM | POA: Insufficient documentation

## 2021-10-27 DIAGNOSIS — G4733 Obstructive sleep apnea (adult) (pediatric): Secondary | ICD-10-CM | POA: Insufficient documentation

## 2021-10-27 DIAGNOSIS — E669 Obesity, unspecified: Secondary | ICD-10-CM | POA: Diagnosis not present

## 2021-10-27 DIAGNOSIS — I129 Hypertensive chronic kidney disease with stage 1 through stage 4 chronic kidney disease, or unspecified chronic kidney disease: Secondary | ICD-10-CM | POA: Insufficient documentation

## 2021-10-27 DIAGNOSIS — Z79899 Other long term (current) drug therapy: Secondary | ICD-10-CM | POA: Insufficient documentation

## 2021-10-27 DIAGNOSIS — R002 Palpitations: Secondary | ICD-10-CM

## 2021-10-27 DIAGNOSIS — I480 Paroxysmal atrial fibrillation: Secondary | ICD-10-CM | POA: Diagnosis not present

## 2021-10-27 NOTE — Progress Notes (Signed)
Primary Care Physician: [name], MD Primary Cardiologist: Dr Percival Spanish  Primary Electrophysiologist: none Referring Physician: [facility]   Danny Fuller is a 46 y.o. male with a history of HTN, DM, HLD, OSA, atrial fibrillation who presents for consultation in the [facility].  The patient was initially diagnosed with atrial fibrillation 10/21/21 after presenting to the ED with symptoms of weakness, fatigue, and palpitations. ECG showed afib with RVR. He spontaneously converted to SR. Patient was started on Eliquis for a CHADS2VASC score of 2 and his labetalol was increased. Patient decreased his labetalol back to 200 mg BID due to hypotension and dizziness at home. He is in SR today. He does admit to significant snoring with h/o OSA. Infrequent alcohol use.   Today, he denies symptoms of palpitations, chest pain, shortness of breath, orthopnea, PND, lower extremity edema, dizziness, presyncope, syncope, bleeding, or neurologic sequela. The patient is tolerating medications without difficulties and is otherwise without complaint today.    Atrial Fibrillation Risk Factors:  he does have symptoms or diagnosis of sleep apnea. he is not compliant with CPAP therapy. Never received it during [place]. he does not have a history of rheumatic fever. he does not have a history of alcohol use.   he has a BMI of Body mass index is 35.04 kg/m.Marland Kitchen Filed Weights   10/27/21 1059  Weight: 113.9 kg    Family History  Problem Relation Age of Onset   Arthritis Other    Diabetes Other    Hypertension Other    Hyperlipidemia Other    Stroke Other    Sudden death Other    Diabetes Mother    Diabetes Sister    Diabetes Brother    Heart attack Father        Died ate 22, couple of "heart attacks"      Atrial Fibrillation Management history:  Previous antiarrhythmic drugs: none Previous cardioversions: none Previous ablations: none CHADS2VASC score:  2 Anticoagulation history: Eliquis   Past Medical History:  Diagnosis Date   Asthma    Back pain    Bulging Disk   Chronic kidney disease    sees Dr. Jamal Maes     Diabetes mellitus    sees [name] NP at [facility] Endocrinology   Diabetic foot ulcers [facility])    sees Dr. Wylene Simmer    GERD (gastroesophageal reflux disease)    Glaucoma    Headache(784.0)    Hyperlipidemia    Hypertension    sees Dr. Percival Spanish    Sleep apnea    CPAP   Past Surgical History:  Procedure Laterality Date   AMPUTATION Left 07/26/2015   Procedure: AMPUTATION LEFT FIFTH RAY;  Surgeon: [name], MD;  Location: [place];  Service: Orthopedics;  Laterality: Left;   bilateral hip pins placed     INCISION AND DRAINAGE PERIRECTAL ABSCESS Right 03/07/2016   Procedure: IRRIGATION AND DEBRIDEMENT PERIRECTAL ABSCESS;  Surgeon: Alphonsa Overall, MD;  [facility];  Service: General;  Laterality: Right;   INCISION AND DRAINAGE PERIRECTAL ABSCESS Right 03/09/2016   Procedure: EXAM UNDER ANESTHESIA, IRRIGATION AND DEBRIDEMENT PERIRECTAL ABSCESS;  Surgeon: Johnathan Hausen, MD;  Location: [facility];  Service: General;  Laterality: Right;  Open, betadine packed wound   TOE AMPUTATION Left 11/19/2019   Removal of all toes from left foot   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/20/2019   Procedure: TRANSMETATARSAL AMPUTATION LEFT FOOT;  Surgeon: Wylene Simmer, MD;  Location: [place];  Service: Orthopedics;  Laterality: Left;    Current Outpatient Medications  Medication Sig Dispense Refill   amLODipine-benazepril (LOTREL) 10-40 MG capsule Take 1 capsule by mouth daily. 90 capsule 3   apixaban (ELIQUIS) 5 MG TABS tablet Take 1 tablet (5 mg total) by mouth 2 (two) times daily. 60 tablet 0   cloNIDine (CATAPRES - DOSED IN MG/24 HR) 0.2 mg/24hr patch Place 1 patch (0.2 mg total) onto the skin once a week. 12 patch 3   cyclobenzaprine (FLEXERIL) 10 MG tablet Take 1 tablet (10 mg total) by mouth 3 (three) times daily as  needed for muscle spasms. 90 tablet 5   Dulaglutide (TRULICITY) 1.5 ER/1.5QM SOPN Trulicity 1.5 GQ/6.7 mL subcutaneous pen injector     glucose blood test strip 1 each by Other route 4 (four) times daily.      insulin aspart (NOVOLOG) 100 UNIT/ML injection Use maximum of 0.6 mL/day in insulin pump 40 mL 2   Insulin Disposable Pump (OMNIPOD DASH PODS, GEN 4,) MISC INJECT 1 EACH INTO THE SKIN CONTINUOUS. APPLY 1 OMNIPOD INSULIN PUMP TO BODY DAILY FOR INSULIN 30 each 1   Insulin Pen Needle 31G X 5 MM MISC 1 each by Other route daily.      labetalol (NORMODYNE) 200 MG tablet Take 1 tablet (200 mg total) by mouth 2 (two) times daily. 180 tablet 3   latanoprost (XALATAN) 0.005 % ophthalmic solution Place 1 drop into both eyes daily.     LYRICA 150 MG capsule Take 1 capsule (150 mg total) by mouth 3 (three) times daily. 90 capsule 5   nortriptyline (PAMELOR) 10 MG capsule TAKE 4 CAPSULES BY MOUTH AT BEDTIME 360 capsule 3   pravastatin (PRAVACHOL) 40 MG tablet Take 1 tablet (40 mg total) by mouth every evening. 90 tablet 3   traMADol (ULTRAM) 50 MG tablet Take 2 tablets (100 mg total) by mouth every 6 (six) hours as needed. 120 tablet 2   No current facility-administered medications for this encounter.    Allergies  Allergen Reactions   Augmentin [Amoxicillin-Pot Clavulanate] Itching and Other (See Comments)    Dizziness   Vancomycin Other (See Comments)    AKI    Social History   Socioeconomic History   Marital status: Married    Spouse name: Not on file   Number of children: 2   Years of education: 12   Highest education level: 12th grade  Occupational History   Occupation: disability  Tobacco Use   Smoking status: Former    Packs/day: 0.50    Years: 20.00    Total pack years: 10.00    Types: Cigarettes    Quit date: 07/23/2015    Years since quitting: 6.2   Smokeless tobacco: Never   Tobacco comments:    Former smoker 10/27/21  Vaping Use   Vaping Use: Never used  Substance  and Sexual Activity   Alcohol use: Yes    Alcohol/week: 1.0 standard drink of alcohol    Types: 1 Standard drinks or equivalent per week    Comment: 1 drink every 1-3 months 10/27/21   Drug use: Never   Sexual activity: Not on file  Other Topics Concern   Not on file  Social History Narrative   Lives with wife and two children in a one story home.  Not working.  Still trying for disability.     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     Education: high school.    Left handed  One story home   Social Determinants of Health   Financial Resource Strain: Low Risk  (05/03/2021)   Overall Financial Resource Strain (CARDIA)    Difficulty of Paying Living Expenses: Not hard at all  Food Insecurity: No Food Insecurity (05/03/2021)   Hunger Vital Sign    Worried About Running Out of Food in the Last Year: Never true    Ran Out of Food in the Last Year: Never true  Transportation Needs: No Transportation Needs (05/03/2021)   PRAPARE - Hydrologist (Medical): No    Lack of Transportation (Non-Medical): No  Physical Activity: Insufficiently Active (05/03/2021)   Exercise Vital Sign    Days of Exercise per Week: 4 days    Minutes of Exercise per Session: 30 min  Stress: No Stress Concern Present (05/03/2021)   [place]    Feeling of Stress : Not at all  Social Connections: [place] (05/03/2021)   Social Connection and Isolation Panel [NHANES]    Frequency of Communication with Friends and Family: More than three times a week    Frequency of Social Gatherings with Friends and Family: More than three times a week    Attends Religious Services: More than 4 times per year    Active Member of [employer] or Organizations: Yes    Attends Music therapist: More than 4 times per year    Marital Status: Married  Human resources officer Violence: Not At Risk (05/03/2021)    Humiliation, Afraid, Rape, and Kick questionnaire    Fear of Current or Ex-Partner: No    Emotionally Abused: No    Physically Abused: No    Sexually Abused: No     ROS- All systems are reviewed and negative except as per the HPI above.  Physical Exam: Vitals:   10/27/21 1059  BP: (!) 160/100  Pulse: 88  Weight: 113.9 kg  Height: '5\' 11"'$  (1.803 m)    GEN- The patient is a well appearing obese male, alert and oriented x 3 today.   Head- normocephalic, atraumatic Eyes-  Sclera clear, conjunctiva pink Ears- hearing intact Oropharynx- clear Neck- supple  Lungs- Clear to ausculation bilaterally, normal work of breathing Heart- Regular rate and rhythm, no murmurs, rubs or gallops  GI- soft, NT, ND, + BS Extremities- no clubbing, cyanosis, or edema MS- no significant deformity or atrophy Skin- no rash or lesion Psych- euthymic mood, full affect Neuro- strength and sensation are intact  Wt Readings from Last 3 Encounters:  10/27/21 113.9 kg  10/21/21 116.1 kg  09/28/21 109.3 kg    EKG today demonstrates  SR Vent. rate 88 BPM PR interval 168 ms QRS duration 90 ms QT/QTcB 346/418 ms  Echo 04/08/18 demonstrated  1. The left ventricle has normal systolic function with an ejection  fraction of 60-65%. The cavity size was normal. There is moderately  increased left ventricular wall thickness. Left ventricular diastolic  Doppler parameters are consistent with impaired relaxation.   2. The right ventricle has normal systolic function. The cavity was  normal. There is no increase in right ventricular wall thickness.   3. The mitral valve is normal in structure.   4. The tricuspid valve is normal in structure.   5. The aortic valve is tricuspid.   6. The pulmonic valve was normal in structure.   7. The aortic root is normal in size and structure.   8. Normal LV function; moderate LVH; mild  diastolic dysfunction; mild TR with mild pulmonary hypertension.   Epic records are  reviewed at length today  CHA2DS2-VASc Score = 2  The patient's score is based upon: CHF History: 0 HTN History: 1 Diabetes History: 1 Stroke History: 0 Vascular Disease History: 0 Age Score: 0 Gender Score: 0       ASSESSMENT AND PLAN: 1. Paroxysmal Atrial Fibrillation (ICD10:  I48.0) The patient's CHA2DS2-VASc score is 2, indicating a 2.2% annual risk of stroke.   Patient in [place] today. We discussed ablation and AAD if his afib becomes frequent/persistent. Hopefully, treatment of OSA will help his afib.  Continue Eliquis 5 mg BID Continue labetalol 200 mg BID  2. Secondary Hypercoagulable State (ICD10:  D68.69) The patient is at significant risk for stroke/thromboembolism based upon his CHA2DS2-VASc Score of 2.  Continue Apixaban (Eliquis).   3. Obesity Body mass index is 35.04 kg/m. Lifestyle modification was discussed at length including regular exercise and weight reduction. Patient has lost a considerable amount of weight over the years. Was at 370 lbs.   4. Obstructive sleep apnea The importance of adequate treatment of sleep apnea was discussed today in order to improve our ability to maintain sinus rhythm long term. Will reach out to sleep medicine to see if he needs repeat sleep study to get CPAP.   5. HTN Elevated today, patient's clonidine patch fell off, normally well controlled. No changes today.    Follow up in the AF clinic in 3 months. Follow up with Dr Percival Spanish as scheduled.    [facility] [address] [place], [place] [address] [phone] 10/27/2021 11:02 AM

## 2021-10-28 DIAGNOSIS — D6869 Other thrombophilia: Secondary | ICD-10-CM | POA: Insufficient documentation

## 2021-11-01 DIAGNOSIS — I480 Paroxysmal atrial fibrillation: Secondary | ICD-10-CM | POA: Insufficient documentation

## 2021-11-01 NOTE — Progress Notes (Unsigned)
Cardiology Office Note   Date:  11/02/2021   ID:  Danny Fuller, DOB Jan 14, 1976, MRN [id number]  PCP:  [name], MD  Cardiologist:   Minus Breeding, MD    Chief Complaint  Patient presents with   Atrial Fibrillation      History of Present Illness: Danny Fuller is a 46 y.o. male who was referred by Dr. Lorrene Reid years ago for evaluation of palpitations.  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 He had a cardiac event monitor that demonstrated rare PVCs.  He had an echo with normal LV function although there was some moderate pulmonary HTN.   However, echo 202 did not demonstrate pulmonary HTN.   He had chest pain in 2022 with no ischemia.   He had atrial fib earlier this month and was in the ED.  He was seen in the [facility].  He said that it started suddenly.  It was rapid rate with shortness of breath and some dizziness.  He did not describe chest discomfort.  He has not had it before or since.  He did not think there was a particular trigger.  He had a little caffeine that day.  He denies any ongoing chest pressure, neck or arm discomfort.  Has had no new shortness of breath, PND or orthopnea.  He otherwise has not had any recent problems.  He denies any palpitations, presyncope or syncope.  He has had no new chest pressure, neck or arm discomfort.  He has had no weight gain or edema.  He did have to have all of the toes of his left foot amputated since I last saw him because of nonhealing ulcers.  He has since checked his blood sugar much better under control.   Past Medical History:  Diagnosis Date   Asthma    Back pain    Bulging Disk   Chronic kidney disease    Diabetes mellitus    sees [name] NP at [facility]ocrinology   Diabetic foot ulcers [facility])    sees Dr. Wylene Simmer    GERD (gastroesophageal reflux disease)    Glaucoma     Headache(784.0)    Hyperlipidemia    Hypertension    sees Dr. Percival Spanish    Sleep apnea    CPAP    Past Surgical History:  Procedure Laterality Date   AMPUTATION Left 07/26/2015   Procedure: AMPUTATION LEFT FIFTH RAY;  Surgeon: [name], MD;  Location: [place];  Service: Orthopedics;  Laterality: Left;   bilateral hip pins placed     INCISION AND DRAINAGE PERIRECTAL ABSCESS Right 03/07/2016   Procedure: IRRIGATION AND DEBRIDEMENT PERIRECTAL ABSCESS;  Surgeon: Alphonsa Overall, MD;  [facility];  Service: General;  Laterality: Right;   INCISION AND DRAINAGE PERIRECTAL ABSCESS Right 03/09/2016   Procedure: EXAM UNDER ANESTHESIA, IRRIGATION AND DEBRIDEMENT PERIRECTAL ABSCESS;  Surgeon: Johnathan Hausen, MD;  [facility];  Service: General;  Laterality: Right;  Open, betadine packed wound   TOE AMPUTATION Left 11/19/2019   Removal of all toes from left foot   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/20/2019   Procedure: TRANSMETATARSAL AMPUTATION LEFT FOOT;  Surgeon: Wylene Simmer, MD;  Location: [place];  Service: Orthopedics;  Laterality: Left;     Current Outpatient Medications  Medication Sig Dispense Refill   amLODipine-benazepril (LOTREL) 10-40 MG capsule Take  1 capsule by mouth daily. 90 capsule 3   apixaban (ELIQUIS) 5 MG TABS tablet Take 1 tablet (5 mg total) by mouth 2 (two) times daily. 60 tablet 0   cloNIDine (CATAPRES - DOSED IN MG/24 HR) 0.2 mg/24hr patch Place 1 patch (0.2 mg total) onto the skin once a week. 12 patch 3   cyclobenzaprine (FLEXERIL) 10 MG tablet Take 1 tablet (10 mg total) by mouth 3 (three) times daily as needed for muscle spasms. 90 tablet 5   Dulaglutide (TRULICITY) 1.5 KD/3.2IZ SOPN Trulicity 1.5 TI/4.5 mL subcutaneous pen injector     glucose blood test strip 1 each by Other route 4 (four) times daily.      insulin aspart (NOVOLOG) 100 UNIT/ML injection Use maximum of 0.6 mL/day in insulin pump 40 mL 2   Insulin Disposable Pump (OMNIPOD DASH PODS, GEN 4,) MISC  INJECT 1 EACH INTO THE SKIN CONTINUOUS. APPLY 1 OMNIPOD INSULIN PUMP TO BODY DAILY FOR INSULIN 30 each 1   Insulin Pen Needle 31G X 5 MM MISC 1 each by Other route daily.      labetalol (NORMODYNE) 200 MG tablet Take 1 tablet (200 mg total) by mouth 2 (two) times daily. 180 tablet 3   LYRICA 150 MG capsule Take 1 capsule (150 mg total) by mouth 3 (three) times daily. 90 capsule 5   nortriptyline (PAMELOR) 10 MG capsule TAKE 4 CAPSULES BY MOUTH AT BEDTIME 360 capsule 3   pravastatin (PRAVACHOL) 40 MG tablet Take 1 tablet (40 mg total) by mouth every evening. 90 tablet 3   traMADol (ULTRAM) 50 MG tablet Take 2 tablets (100 mg total) by mouth every 6 (six) hours as needed. 120 tablet 2   No current facility-administered medications for this visit.    Allergies:   Augmentin [amoxicillin-pot clavulanate] and Vancomycin   ROS:  Please see the history of present illness.   Otherwise, review of systems are positive for none.   All other systems are reviewed and negative.    PHYSICAL EXAM: VS:  BP (!) 160/96   Pulse 84   Ht '5\' 10"'$  (1.778 m)   Wt 248 lb 3.2 oz (112.6 kg)   SpO2 100%   BMI 35.61 kg/m  , BMI Body mass index is 35.61 kg/m.  GENERAL:  Well appearing NECK:  No jugular venous distention, waveform within normal limits, carotid upstroke brisk and symmetric, no bruits, no thyromegaly LUNGS:  Clear to auscultation bilaterally CHEST:  Unremarkable HEART:  PMI not displaced or sustained,S1 and S2 within normal limits, no S3, no S4, no clicks, no rubs, no murmurs ABD:  Flat, positive bowel sounds normal in frequency in pitch, no bruits, no rebound, no guarding, no midline pulsatile mass, no hepatomegaly, no splenomegaly EXT:  2 plus pulses throughout, mild left leg edema, no cyanosis no clubbing.  Status post left toe amputations  EKG:  EKG is not ordered today. Sinus rhythm, rate 88  RAD, intervals within normal limits, no acute ST-T wave changes.  10/27/2021  Recent Labs: 09/28/2021:  ALT 26 10/21/2021: BUN 15; Creatinine, Ser 1.29; Hemoglobin 16.9; Magnesium 1.9; Platelets 371; Potassium 4.0; Sodium 138; TSH 1.515    Lipid Panel    Component Value Date/Time   CHOL 148 09/28/2021 1112   TRIG 182.0 (H) 09/28/2021 1112   HDL 34.00 (L) 09/28/2021 1112   CHOLHDL 4 09/28/2021 1112   VLDL 36.4 09/28/2021 1112   LDLCALC 78 09/28/2021 1112   LDLDIRECT 85.0 06/05/2019 8099  Wt Readings from Last 3 Encounters:  11/02/21 248 lb 3.2 oz (112.6 kg)  10/27/21 251 lb 3.2 oz (113.9 kg)  10/21/21 256 lb (116.1 kg)      Other studies Reviewed: Additional studies/ records that were reviewed today include:    Labs Review of the above records demonstrates: See elsewhere   ASSESSMENT AND PLAN:   ATRIAL FIB: Patient had 1 episode of paroxysmal atrial fibrillation.  He is on appropriate anticoagulation and tolerating this.  We did talk about potentially taking an extra 50 mg of labetalol if he is in a bout of rapid atrial fibrillation.  For now I do not think he needs an antiarrhythmic or consideration of ablation given this was the first event.  I am however going to check an echocardiogram.  CHEST PAIN:    He has had no further chest discomfort.  No change in therapy.  HTN:   The blood pressure is elevated today but this is unusual.  No change in therapy.  He did not tolerate any increase in his beta-blocker.   DM: His blood sugar is 10.0.  He needs to have close follow-up with [name], MD  DYSLIPIDEMIA:   His last LDL was 78 with an HDL of 34 in August.  No change in therapy.   SLEEP APNEA:   He had mild sleep apnea before and was supposed to get CPAP but COVID intervened.  I am going to send him back for another sleep study so we can get a new prescription for CPAP.   PULMONARY HTN: I will check this at the time of his echocardiogram.   Current medicines are reviewed at length with the patient today.  The patient does not have concerns regarding  medicines.  The following changes have been made:   None  Labs/ tests ordered today include:   Orders Placed This Encounter  Procedures   ECHOCARDIOGRAM COMPLETE   Split night study     Disposition:   FU with me in 6 months   Signed, Minus Breeding, MD  11/02/2021 9:50 AM    [place]

## 2021-11-02 ENCOUNTER — Ambulatory Visit: Payer: Medicare Other | Attending: Cardiology | Admitting: Cardiology

## 2021-11-02 ENCOUNTER — Other Ambulatory Visit (HOSPITAL_COMMUNITY): Payer: Self-pay | Admitting: *Deleted

## 2021-11-02 ENCOUNTER — Telehealth: Payer: Self-pay | Admitting: *Deleted

## 2021-11-02 ENCOUNTER — Encounter: Payer: Self-pay | Admitting: Cardiology

## 2021-11-02 VITALS — BP 160/96 | HR 84 | Ht 70.0 in | Wt 248.2 lb

## 2021-11-02 DIAGNOSIS — I10 Essential (primary) hypertension: Secondary | ICD-10-CM | POA: Diagnosis not present

## 2021-11-02 DIAGNOSIS — R072 Precordial pain: Secondary | ICD-10-CM

## 2021-11-02 DIAGNOSIS — I480 Paroxysmal atrial fibrillation: Secondary | ICD-10-CM | POA: Diagnosis not present

## 2021-11-02 DIAGNOSIS — G4730 Sleep apnea, unspecified: Secondary | ICD-10-CM

## 2021-11-02 DIAGNOSIS — E785 Hyperlipidemia, unspecified: Secondary | ICD-10-CM | POA: Diagnosis not present

## 2021-11-02 NOTE — Telephone Encounter (Signed)
Reached out to [facility] ([facility]) and he states they tried to contact the patient 01/2020 several times by phone several times and by mail but the patient never replied back to them. Finally the patient replied back after several attempts and was set up for an appointment but he no showed he was rescheduled and he no showed again. [facility] tried to contact the patient 4 more times up to a year for setup but no response from the patient. The process has be restarted with Face to face encounter, new sleep study or an in lab titration and new order is needed.

## 2021-11-02 NOTE — Telephone Encounter (Signed)
New order for sleep study placed by Dr. Percival Spanish at visit today.

## 2021-11-02 NOTE — Telephone Encounter (Signed)
-----   Message from Lauralee Evener, [place] sent at 10/27/2021 11:34 AM EDT ----- Regarding: FW: ?sleep study  ----- Message ----- From: Juluis Mire, RN Sent: 10/27/2021  11:26 AM EDT To: [facility] Sleep Studies Subject: ?sleep study                                   Pt had sleep study in 2021 but never received cpap due to supply issues - pt ready to start cpap. Please reach out to pt Thanks Marzetta Board

## 2021-11-02 NOTE — Patient Instructions (Signed)
Medication Instructions:  No changes *If you need a refill on your cardiac medications before your next appointment, please call your pharmacy*   Lab Work: None ordered If you have labs (blood work) drawn today and your tests are completely normal, you will receive your results only by: [place] (if you have MyChart) OR A paper copy in the mail If you have any lab test that is abnormal or we need to change your treatment, we will call you to review the results.   Testing/Procedures: Your physician has requested that you have an echocardiogram. Echocardiography is a painless test that uses sound waves to create images of your heart. It provides your doctor with information about the size and shape of your heart and how well your heart's chambers and valves are working. You may receive an ultrasound enhancing agent through an IV if needed to better visualize your heart during the echo.This procedure takes approximately one hour. There are no restrictions for this procedure. This will take place at the [address], [address].   Your physician has recommended that you have a sleep study. This test records several body functions during sleep, including: brain activity, eye movement, oxygen and carbon dioxide blood levels, heart rate and rhythm, breathing rate and rhythm, the flow of air through your mouth and nose, snoring, body muscle movements, and chest and belly movement. They will call you to set this up.    Follow-Up: At [facility], you and your health needs are our priority.  As part of our continuing mission to provide you with exceptional heart care, we have created designated Provider Care Teams.  These Care Teams include your primary Cardiologist (physician) and Advanced Practice Providers (APPs -  Physician Assistants and Nurse Practitioners) who all work together to provide you with the care you need, when you need it.  We recommend signing up for the patient  portal called "MyChart".  Sign up information is provided on this After Visit Summary.  MyChart is used to connect with patients for Virtual Visits (Telemedicine).  Patients are able to view lab/test results, encounter notes, upcoming appointments, etc.  Non-urgent messages can be sent to your provider as well.   To learn more about what you can do with MyChart, go to [email or web address].    Your next appointment:   6 month(s)  The format for your next appointment:   In Person  Provider:   Minus Breeding, MD

## 2021-11-15 ENCOUNTER — Other Ambulatory Visit (HOSPITAL_COMMUNITY): Payer: Self-pay | Admitting: Emergency Medicine

## 2021-11-16 ENCOUNTER — Other Ambulatory Visit (HOSPITAL_COMMUNITY): Payer: Self-pay

## 2021-11-16 ENCOUNTER — Encounter (HOSPITAL_COMMUNITY): Payer: Self-pay

## 2021-11-16 ENCOUNTER — Ambulatory Visit (HOSPITAL_COMMUNITY): Payer: Medicare Other | Attending: Cardiology

## 2021-11-16 DIAGNOSIS — Z89431 Acquired absence of right foot: Secondary | ICD-10-CM | POA: Diagnosis not present

## 2021-11-16 DIAGNOSIS — L84 Corns and callosities: Secondary | ICD-10-CM | POA: Diagnosis not present

## 2021-11-16 MED ORDER — APIXABAN 5 MG PO TABS
5.0000 mg | ORAL_TABLET | Freq: Two times a day (BID) | ORAL | 0 refills | Status: DC
  Filled 2021-11-16: qty 60, 30d supply, fill #0

## 2021-11-18 ENCOUNTER — Encounter: Payer: Self-pay | Admitting: Family Medicine

## 2021-11-18 ENCOUNTER — Ambulatory Visit (INDEPENDENT_AMBULATORY_CARE_PROVIDER_SITE_OTHER): Payer: Medicare Other | Admitting: Family Medicine

## 2021-11-18 VITALS — BP 160/106 | HR 84 | Temp 97.8°F | Wt 252.0 lb

## 2021-11-18 DIAGNOSIS — I10 Essential (primary) hypertension: Secondary | ICD-10-CM | POA: Diagnosis not present

## 2021-11-18 DIAGNOSIS — L97529 Non-pressure chronic ulcer of other part of left foot with unspecified severity: Secondary | ICD-10-CM | POA: Diagnosis not present

## 2021-11-18 DIAGNOSIS — E1140 Type 2 diabetes mellitus with diabetic neuropathy, unspecified: Secondary | ICD-10-CM | POA: Diagnosis not present

## 2021-11-18 DIAGNOSIS — Z794 Long term (current) use of insulin: Secondary | ICD-10-CM | POA: Diagnosis not present

## 2021-11-18 DIAGNOSIS — E11621 Type 2 diabetes mellitus with foot ulcer: Secondary | ICD-10-CM | POA: Diagnosis not present

## 2021-11-18 DIAGNOSIS — E1142 Type 2 diabetes mellitus with diabetic polyneuropathy: Secondary | ICD-10-CM | POA: Diagnosis not present

## 2021-11-18 DIAGNOSIS — I480 Paroxysmal atrial fibrillation: Secondary | ICD-10-CM | POA: Diagnosis not present

## 2021-11-18 LAB — POCT GLYCOSYLATED HEMOGLOBIN (HGB A1C): Hemoglobin A1C: 9.1 % — AB (ref 4.0–5.6)

## 2021-11-18 MED ORDER — CLONIDINE 0.3 MG/24HR TD PTWK: 0.3000 mg | MEDICATED_PATCH | TRANSDERMAL | 11 refills | Status: DC

## 2021-11-18 MED ORDER — TRULICITY 1.5 MG/0.5ML ~~LOC~~ SOAJ
SUBCUTANEOUS | 1 refills | Status: DC
Start: 2021-11-18 — End: 2021-11-29

## 2021-11-18 MED ORDER — INSULIN ASPART 100 UNIT/ML IJ SOLN: INTRAMUSCULAR | 0 refills | Status: DC

## 2021-11-18 NOTE — Addendum Note (Signed)
Addended by: Wyvonne Lenz on: 11/18/2021 11:00 AM   Modules accepted: Orders

## 2021-11-18 NOTE — Progress Notes (Signed)
   Subjective:    Patient ID: Danny Fuller, male    DOB: 1975/04/12, 46 y.o.   MRN: [id number]  HPI Here for a diabetic foot exam and for other issues. He sees Dr. Wylene Simmer regularly to pare down the calluses on his feet. He last saw him on 11-16-21. His feet have been doing very well lately. His BP has been running high however for the past month. He often gets 150-165 over 100-110 at home. No chest pain or SOB. Finally his diabetes has been out of control. He last saw [name] NP at [facility] on 08-03-20. At that time he was doing well and his A1c was 7.4%.  When we saw Rod on 09-28-21 it was up to 10.0%, in part because he was low on medications. I insisted at that time that he follow up with her asap, but he says the soonest appt he could get with her was in November. She has refused to refill any of his medications until this visit, so he has been out of Trulicity for the past 3 months. He is almost out of Novolog. His A1c today is 9.1%.   Review of Systems  Constitutional: Negative.   Respiratory: Negative.    Cardiovascular: Negative.   Skin: Negative.   Neurological: Negative.        Objective:   Physical Exam Constitutional:      Appearance: Normal appearance.  Cardiovascular:     Rate and Rhythm: Normal rate and regular rhythm.     Pulses: Normal pulses.     Heart sounds: Normal heart sounds.  Pulmonary:     Effort: Pulmonary effort is normal.     Breath sounds: Normal breath sounds.  Skin:    Comments: Both feet are pink and warm. PT and DP pulses are full. The left heel has a callus which is clean. The the right foot has a callus over the first metatarsal head which is clean. Sensation to light touch is diminished on each foot from the ankles down   Neurological:     General: No focal deficit present.     Mental Status: He is alert and oriented to person, place, and time.           Assessment & Plan:  His HTN is not well controlled, so we will  increase the Clinidine patches to 0.3 % once weekly. His diabetes is not well controlled, but this is not surprising since he has been short on medication. We refilled the Novolog and the Trulicity to last until he can see [facility] again.  [name], MD

## 2021-11-28 ENCOUNTER — Encounter: Payer: Self-pay | Admitting: Family Medicine

## 2021-11-29 ENCOUNTER — Other Ambulatory Visit: Payer: Self-pay

## 2021-11-29 ENCOUNTER — Telehealth: Payer: Self-pay | Admitting: Family Medicine

## 2021-11-29 DIAGNOSIS — Z794 Long term (current) use of insulin: Secondary | ICD-10-CM

## 2021-11-29 MED ORDER — TRULICITY 1.5 MG/0.5ML ~~LOC~~ SOAJ: SUBCUTANEOUS | 1 refills | Status: DC

## 2021-11-29 MED ORDER — INSULIN ASPART 100 UNIT/ML IJ SOLN: INTRAMUSCULAR | 0 refills | Status: DC

## 2021-11-29 NOTE — Telephone Encounter (Signed)
Calling because they need clarity on the directions for Dulaglutide (TRULICITY) 1.5 HF/4.1SE SOPN   and they need a maximum daily dosage for the insulin aspart (NOVOLOG) 100 UNIT/ML injection

## 2021-11-29 NOTE — Telephone Encounter (Signed)
The Trulicity dosing is once a week, and the maximum daily dosing of Novolog is 100 units per 24 hours

## 2021-11-30 ENCOUNTER — Ambulatory Visit (HOSPITAL_COMMUNITY): Payer: Medicare Other | Attending: Internal Medicine

## 2021-11-30 DIAGNOSIS — I480 Paroxysmal atrial fibrillation: Secondary | ICD-10-CM

## 2021-11-30 NOTE — Telephone Encounter (Signed)
Spoke with Ronalee Belts at [facility], clarification given concerning medications.

## 2021-12-01 LAB — ECHOCARDIOGRAM COMPLETE
Area-P 1/2: 9.6 cm2
S' Lateral: 2.5 cm

## 2021-12-02 ENCOUNTER — Telehealth: Payer: Self-pay | Admitting: *Deleted

## 2021-12-02 DIAGNOSIS — I517 Cardiomegaly: Secondary | ICD-10-CM

## 2021-12-02 NOTE — Telephone Encounter (Signed)
pt aware of results  Order placed  

## 2021-12-02 NOTE — Telephone Encounter (Signed)
-----   Message from Minus Breeding, MD sent at 12/02/2021  2:14 PM EDT ----- There is no evidence of pulmonary HTN.  However, he has increasing LV wall thickness and I would like for him to have a PYP scan for amyloid.  Call Mr. Dorsainvil with the results and send results to [name], MD

## 2021-12-05 ENCOUNTER — Telehealth: Payer: Self-pay | Admitting: Family Medicine

## 2021-12-05 NOTE — Telephone Encounter (Signed)
Danny Fuller with [facility] and pt would like a new rx for dexcom G7 the transmitter and sensor is cover on his [facility] [id number] - [place], [place] Phone:  [phone]  Fax:  [phone]

## 2021-12-07 ENCOUNTER — Telehealth (HOSPITAL_COMMUNITY): Payer: Self-pay | Admitting: *Deleted

## 2021-12-07 NOTE — Telephone Encounter (Signed)
Let message reminding patient of his upcoming appointment.  Kirstie Peri

## 2021-12-08 MED ORDER — DEXCOM G7 SENSOR MISC: 1.0000 | 3 refills | Status: DC

## 2021-12-08 MED ORDER — DEXCOM G7 RECEIVER DEVI: 1.0000 | Freq: Once | 2 refills | Status: AC

## 2021-12-08 NOTE — Telephone Encounter (Signed)
Pharmacy updated.

## 2021-12-08 NOTE — Telephone Encounter (Signed)
I sent these in  

## 2021-12-09 ENCOUNTER — Encounter (HOSPITAL_BASED_OUTPATIENT_CLINIC_OR_DEPARTMENT_OTHER): Payer: Medicare Other | Admitting: Cardiology

## 2021-12-14 ENCOUNTER — Ambulatory Visit (HOSPITAL_COMMUNITY): Payer: Medicare Other | Attending: Cardiovascular Disease

## 2021-12-14 DIAGNOSIS — I517 Cardiomegaly: Secondary | ICD-10-CM | POA: Insufficient documentation

## 2021-12-14 MED ORDER — TECHNETIUM TC 99M PYROPHOSPHATE
21.3000 | Freq: Once | INTRAVENOUS | Status: AC
  Administered 2021-12-14: 21.3 via INTRAVENOUS

## 2021-12-19 ENCOUNTER — Other Ambulatory Visit: Payer: Self-pay | Admitting: *Deleted

## 2021-12-19 ENCOUNTER — Encounter: Payer: Self-pay | Admitting: *Deleted

## 2021-12-19 DIAGNOSIS — I517 Cardiomegaly: Secondary | ICD-10-CM

## 2021-12-22 ENCOUNTER — Other Ambulatory Visit (HOSPITAL_COMMUNITY): Payer: Self-pay | Admitting: Emergency Medicine

## 2021-12-28 ENCOUNTER — Other Ambulatory Visit: Payer: Self-pay | Admitting: Internal Medicine

## 2021-12-28 ENCOUNTER — Other Ambulatory Visit (HOSPITAL_COMMUNITY): Payer: Self-pay

## 2022-01-03 ENCOUNTER — Ambulatory Visit (HOSPITAL_BASED_OUTPATIENT_CLINIC_OR_DEPARTMENT_OTHER): Payer: Medicare Other | Attending: Cardiology | Admitting: Cardiology

## 2022-01-03 DIAGNOSIS — G4730 Sleep apnea, unspecified: Secondary | ICD-10-CM | POA: Diagnosis present

## 2022-01-03 DIAGNOSIS — G4733 Obstructive sleep apnea (adult) (pediatric): Secondary | ICD-10-CM | POA: Insufficient documentation

## 2022-01-09 NOTE — Procedures (Signed)
     Patient Name: Danny Fuller, Danny Fuller Date: 01/03/2022 Gender: Male D.O.B: 17-Jul-1975 Age (years): 46 Referring Provider: Minus Breeding Height (inches): 75 Interpreting Physician: Fransico Him MD, ABSM Weight (lbs): 250 RPSGT: Jorge Ny BMI: 36 MRN: [id number] Neck Size: 19.50  CLINICAL INFORMATION Sleep Study Type: NPSG  Indication for sleep study: Diabetes, OSA, Re-Evaluation, Snoring  Epworth Sleepiness Score: 8  Most recent polysomnogram dated 01/15/2020 revealed an AHI of 13.6/h and RDI of 21.4/h. Most recent titration study dated 01/15/2020 was optimal at 8cm H2O with an AHI of 0.3/h.  SLEEP STUDY TECHNIQUE As per the AASM Manual for the Scoring of Sleep and Associated Events v2.3 (April 2016) with a hypopnea requiring 4% desaturations.  The channels recorded and monitored were frontal, central and occipital EEG, electrooculogram (EOG), submentalis EMG (chin), nasal and oral airflow, thoracic and abdominal wall motion, anterior tibialis EMG, snore microphone, electrocardiogram, and pulse oximetry.  MEDICATIONS Medications self-administered by patient taken the night of the study : N/A  SLEEP ARCHITECTURE The study was initiated at 9:50:33 PM and ended at 4:40:17 AM.  Sleep onset time was 1.3 minutes and the sleep efficiency was 93.7%. The total sleep time was 383.9 minutes.  Stage REM latency was 51.5 minutes.  The patient spent 4.3% of the night in stage N1 sleep, 71.0% in stage N2 sleep, 0.0% in stage N3 and 24.7% in REM.  Alpha intrusion was absent.  Supine sleep was 79.42%.  RESPIRATORY PARAMETERS The overall apnea/hypopnea index (AHI) was 7.0 per hour. There were 2 total apneas, including 1 obstructive, 0 central and 1 mixed apneas. There were 43 hypopneas and 13 RERAs.  The AHI during Stage REM sleep was 20.8 per hour.  AHI while supine was 8.3 per hour.  The mean oxygen saturation was 95.3%. The minimum SpO2 during sleep was 83.0%.  loud  snoring was noted during this study.  CARDIAC DATA The 2 lead EKG demonstrated sinus rhythm. The mean heart rate was 82.9 beats per minute. Other EKG findings include: None.  LEG MOVEMENT DATA The total PLMS were 0 with a resulting PLMS index of 0.0. Associated arousal with leg movement index was 0.0 .  IMPRESSIONS - Mild obstructive sleep apnea occurred during this study (AHI = 7.0/h). - Mild oxygen desaturation was noted during this study (Min O2 = 83.0%). - The patient snored with loud snoring volume. - No cardiac abnormalities were noted during this study. - Clinically significant periodic limb movements did not occur during sleep. No significant associated arousals.  DIAGNOSIS - Obstructive Sleep Apnea (G47.33)  RECOMMENDATIONS - Recommend a trial of auto CPAP from 4 to 15cm H2O with heated humidity and mask of choice. - Positional therapy avoiding supine position during sleep. - Avoid alcohol, sedatives and other CNS depressants that may worsen sleep apnea and disrupt normal sleep architecture. - Sleep hygiene should be reviewed to assess factors that may improve sleep quality. - Weight management and regular exercise should be initiated or continued if appropriate. - Followup in sleep clinic in 6 weeks.  [Electronically signed] 01/09/2022 12:47 PM  Fransico Him MD, ABSM Diplomate, American Board of Sleep Medicine

## 2022-01-12 ENCOUNTER — Telehealth: Payer: Self-pay | Admitting: *Deleted

## 2022-01-12 DIAGNOSIS — I10 Essential (primary) hypertension: Secondary | ICD-10-CM

## 2022-01-12 DIAGNOSIS — I272 Pulmonary hypertension, unspecified: Secondary | ICD-10-CM

## 2022-01-12 DIAGNOSIS — G4730 Sleep apnea, unspecified: Secondary | ICD-10-CM

## 2022-01-12 NOTE — Telephone Encounter (Signed)
The patient has been notified of the result and verbalized understanding.  All questions (if any) were answered. Danny Fuller, [name] 01/12/2022 5:08 PM    Upon patient request DME selection is [facility]. Patient understands he will be contacted by [facility] to set up his cpap. Patient understands to call if [facility] does not contact him with new setup in a timely manner. Patient understands they will be called once confirmation has been received from [facility]/ that they have received their new machine to schedule 10 week follow up appointment.   [facility] notified of new cpap order  Please add to airview Patient was grateful for the call and thanked me.

## 2022-01-12 NOTE — Telephone Encounter (Signed)
-----   Message from Lauralee Evener, [place] sent at 01/09/2022  1:52 PM EST -----  ----- Message ----- From: Sueanne Margarita, MD Sent: 01/09/2022  12:48 PM EST To: Cv Div Sleep Studies  Please let patient know that they have sleep apnea and recommend treating with CPAP.  Please order an auto CPAP from 4-15cm H2O with heated humidity and mask of choice.  Order overnight pulse ox on CPAP.  Followup with me in 6 weeks.

## 2022-01-24 DIAGNOSIS — Z89432 Acquired absence of left foot: Secondary | ICD-10-CM | POA: Diagnosis not present

## 2022-01-24 DIAGNOSIS — E1165 Type 2 diabetes mellitus with hyperglycemia: Secondary | ICD-10-CM | POA: Diagnosis not present

## 2022-01-24 DIAGNOSIS — I10 Essential (primary) hypertension: Secondary | ICD-10-CM | POA: Diagnosis not present

## 2022-01-24 DIAGNOSIS — E1140 Type 2 diabetes mellitus with diabetic neuropathy, unspecified: Secondary | ICD-10-CM | POA: Diagnosis not present

## 2022-01-25 NOTE — Progress Notes (Signed)
Primary Care Physician: [name], MD Primary Cardiologist: Dr Percival Spanish  Primary Electrophysiologist: none Referring Physician: [facility]   Danny Fuller is a 46 y.o. male with a history of HTN, DM, HLD, OSA, atrial fibrillation who presents for follow up in the [facility].  The patient was initially diagnosed with atrial fibrillation 10/21/21 after presenting to the ED with symptoms of weakness, fatigue, and palpitations. ECG showed afib with RVR. He spontaneously converted to SR. Patient was started on Eliquis for a CHADS2VASC score of 2 and his labetalol was increased. Patient decreased his labetalol back to 200 mg BID due to hypotension and dizziness at home.   On follow up today, ***  Today, he denies symptoms of ***palpitations, chest pain, shortness of breath, orthopnea, PND, lower extremity edema, dizziness, presyncope, syncope, bleeding, or neurologic sequela. The patient is tolerating medications without difficulties and is otherwise without complaint today.    Atrial Fibrillation Risk Factors:  he does have symptoms or diagnosis of sleep apnea. he is not compliant with CPAP therapy. *** he does not have a history of rheumatic fever. he does not have a history of alcohol use.   he has a BMI of There is no height or weight on file to calculate BMI.. There were no vitals filed for this visit.   Family History  Problem Relation Age of Onset   Arthritis Other    Diabetes Other    Hypertension Other    Hyperlipidemia Other    Stroke Other    Sudden death Other    Diabetes Mother    Diabetes Sister    Diabetes Brother    Heart attack Father        Died ate 62, couple of "heart attacks"      Atrial Fibrillation Management history:  Previous antiarrhythmic drugs: none Previous cardioversions: none Previous ablations: none CHADS2VASC score: 2 Anticoagulation history: Eliquis   Past Medical History:  Diagnosis Date    Asthma    Back pain    Bulging Disk   Chronic kidney disease    Diabetes mellitus    sees [name] NP at [facility]   Diabetic foot ulcers [facility])    sees Dr. Wylene Simmer    GERD (gastroesophageal reflux disease)    Glaucoma    Headache(784.0)    Hyperlipidemia    Hypertension    sees Dr. Percival Spanish    Sleep apnea    CPAP   Past Surgical History:  Procedure Laterality Date   AMPUTATION Left 07/26/2015   Procedure: AMPUTATION LEFT FIFTH RAY;  Surgeon: [name], MD;  Location: [facility];  Service: Orthopedics;  Laterality: Left;   bilateral hip pins placed     INCISION AND DRAINAGE PERIRECTAL ABSCESS Right 03/07/2016   Procedure: IRRIGATION AND DEBRIDEMENT PERIRECTAL ABSCESS;  Surgeon: Alphonsa Overall, MD;  Location: [facility];  Service: General;  Laterality: Right;   INCISION AND DRAINAGE PERIRECTAL ABSCESS Right 03/09/2016   Procedure: EXAM UNDER ANESTHESIA, IRRIGATION AND DEBRIDEMENT PERIRECTAL ABSCESS;  Surgeon: Johnathan Hausen, MD;  Location: [facility];  Service: General;  Laterality: Right;  Open, betadine packed wound   TOE AMPUTATION Left 11/19/2019   Removal of all toes from left foot   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/20/2019   Procedure: TRANSMETATARSAL AMPUTATION LEFT FOOT;  Surgeon: Wylene Simmer, MD;  Location: [facility];  Service: Orthopedics;  Laterality: Left;    Current Outpatient Medications  Medication Sig Dispense Refill   amLODipine-benazepril (  LOTREL) 10-40 MG capsule Take 1 capsule by mouth daily. 90 capsule 3   apixaban (ELIQUIS) 5 MG TABS tablet Take 1 tablet (5 mg total) by mouth 2 (two) times daily. 60 tablet 0   cloNIDine (CATAPRES - DOSED IN MG/24 HR) 0.3 mg/24hr patch Place 1 patch (0.3 mg total) onto the skin once a week. 4 patch 11   Continuous Blood Gluc Sensor (DEXCOM G7 SENSOR) MISC 1 Application by Does not apply route every 14 (fourteen) days. 12 each 3   cyclobenzaprine (FLEXERIL) 10 MG tablet Take 1 tablet (10 mg total) by mouth 3 (three)  times daily as needed for muscle spasms. 90 tablet 5   Dulaglutide (TRULICITY) 1.5 ZO/1.0RU SOPN Trulicity 1.5 EA/5.4 mL subcutaneous pen injector 2 mL 1   glucose blood test strip 1 each by Other route 4 (four) times daily.      insulin aspart (NOVOLOG) 100 UNIT/ML injection Administer as directed to insulin pump 60 mL 0   Insulin Disposable Pump (OMNIPOD DASH PODS, GEN 4,) MISC INJECT 1 EACH INTO THE SKIN CONTINUOUS. APPLY 1 OMNIPOD INSULIN PUMP TO BODY DAILY FOR INSULIN 30 each 1   Insulin Pen Needle 31G X 5 MM MISC 1 each by Other route daily.      labetalol (NORMODYNE) 200 MG tablet Take 1 tablet (200 mg total) by mouth 2 (two) times daily. 180 tablet 3   LYRICA 150 MG capsule Take 1 capsule (150 mg total) by mouth 3 (three) times daily. 90 capsule 5   nortriptyline (PAMELOR) 10 MG capsule TAKE 4 CAPSULES BY MOUTH AT BEDTIME 360 capsule 3   pravastatin (PRAVACHOL) 40 MG tablet Take 1 tablet (40 mg total) by mouth every evening. 90 tablet 3   traMADol (ULTRAM) 50 MG tablet Take 2 tablets (100 mg total) by mouth every 6 (six) hours as needed. 120 tablet 2   No current facility-administered medications for this visit.    Allergies  Allergen Reactions   Augmentin [Amoxicillin-Pot Clavulanate] Itching and Other (See Comments)    Dizziness   Vancomycin Other (See Comments)    AKI    Social History   Socioeconomic History   Marital status: Married    Spouse name: Not on file   Number of children: 2   Years of education: 12   Highest education level: 12th grade  Occupational History   Occupation: disability  Tobacco Use   Smoking status: Former    Packs/day: 0.50    Years: 20.00    Total pack years: 10.00    Types: Cigarettes    Quit date: 07/23/2015    Years since quitting: 6.5   Smokeless tobacco: Never   Tobacco comments:    Former smoker 10/27/21  Vaping Use   Vaping Use: Never used  Substance and Sexual Activity   Alcohol use: Yes    Alcohol/week: 1.0 standard drink of  alcohol    Types: 1 Standard drinks or equivalent per week    Comment: 1 drink every 1-3 months 10/27/21   Drug use: Never   Sexual activity: Not on file  Other Topics Concern   Not on file  Social History Narrative   Lives with wife and two children in a one story home.  Not working.  Still trying for disability.     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     Education: high school.    Left handed   One story home   Social Determinants of Health  Financial Resource Strain: Low Risk  (05/03/2021)   Overall Financial Resource Strain (CARDIA)    Difficulty of Paying Living Expenses: Not hard at all  Food Insecurity: No Food Insecurity (05/03/2021)   Hunger Vital Sign    Worried About Running Out of Food in the Last Year: Never true    Ran Out of Food in the Last Year: Never true  Transportation Needs: No Transportation Needs (05/03/2021)   PRAPARE - Hydrologist (Medical): No    Lack of Transportation (Non-Medical): No  Physical Activity: Insufficiently Active (05/03/2021)   Exercise Vital Sign    Days of Exercise per Week: 4 days    Minutes of Exercise per Session: 30 min  Stress: No Stress Concern Present (05/03/2021)   Worth    Feeling of Stress : Not at all  Social Connections: [place] (05/03/2021)   Social Connection and Isolation Panel [NHANES]    Frequency of Communication with Friends and Family: More than three times a week    Frequency of Social Gatherings with Friends and Family: More than three times a week    Attends Religious Services: More than 4 times per year    Active Member of [employer] or Organizations: Yes    Attends Music therapist: More than 4 times per year    Marital Status: Married  Human resources officer Violence: Not At Risk (05/03/2021)   Humiliation, Afraid, Rape, and Kick questionnaire    Fear of Current or  Ex-Partner: No    Emotionally Abused: No    Physically Abused: No    Sexually Abused: No     ROS- All systems are reviewed and negative except as per the HPI above.  Physical Exam: There were no vitals filed for this visit.   GEN- The patient is a well appearing *** {Desc; male/male:[id number]}, alert and oriented x 3 today.   HEENT-head normocephalic, atraumatic, sclera clear, conjunctiva pink, hearing intact, trachea midline. Lungs- Clear to ausculation bilaterally, normal work of breathing Heart- ***Regular rate and rhythm, no murmurs, rubs or gallops  GI- soft, NT, ND, + BS Extremities- no clubbing, cyanosis, or edema MS- no significant deformity or atrophy Skin- no rash or lesion Psych- euthymic mood, full affect Neuro- strength and sensation are intact   Wt Readings from Last 3 Encounters:  01/03/22 113.4 kg  11/18/21 114.3 kg  11/02/21 112.6 kg    EKG today demonstrates  ***  Echo 12/14/21 demonstrated   1. Strain attempted. Tracking was not accurate in all segments to provide  accurate number, but overall global longitudinal strain appears mildly  decreased. Would consider MRI to further evaluate myocardium/myocardial  performance.. Left ventricular  ejection fraction, by estimation, is 60 to 65%. The left ventricle has  normal function. The left ventricle has no regional wall motion  abnormalities. There is severe concentric left ventricular hypertrophy.  Left ventricular diastolic parameters are  consistent with Grade I diastolic dysfunction (impaired relaxation).   2. Right ventricular systolic function is normal. The right ventricular  size is normal. There is normal pulmonary artery systolic pressure.   3. The mitral valve is normal in structure. Mild mitral valve  regurgitation.   4. The aortic valve is normal in structure. Aortic valve regurgitation is  not visualized.   5. The inferior vena cava is normal in size with greater than 50%  respiratory  variability, suggesting right atrial pressure of 3 mmHg.  Epic records are reviewed at length today  CHA2DS2-VASc Score = 2  The patient's score is based upon: CHF History: 0 HTN History: 1 Diabetes History: 1 Stroke History: 0 Vascular Disease History: 0 Age Score: 0 Gender Score: 0       ASSESSMENT AND PLAN: 1. Paroxysmal Atrial Fibrillation (ICD10:  I48.0) The patient's CHA2DS2-VASc score is 2, indicating a 2.2% annual risk of stroke.   *** We discussed ablation and AAD if his afib becomes frequent/persistent.  Continue Eliquis 5 mg BID Continue labetalol 200 mg BID  2. Secondary Hypercoagulable State (ICD10:  D68.69) The patient is at significant risk for stroke/thromboembolism based upon his CHA2DS2-VASc Score of 2.  Continue Apixaban (Eliquis).   3. Obesity There is no height or weight on file to calculate BMI. Patient has lost a considerable amount of weight over the years. Was at 370 lbs.  Lifestyle modification was discussed and encouraged including regular physical activity and weight reduction. ***  4. Obstructive sleep apnea Repeat sleep study 01/03/22 showed mild OSA ***  5. HTN Stable, no changes today. ***   Follow up ***with Dr Percival Spanish per recall.    [facility] [address] [place], [place] [address] [phone] 01/25/2022 3:10 PM

## 2022-01-26 ENCOUNTER — Encounter (HOSPITAL_COMMUNITY): Payer: Self-pay | Admitting: Physician Assistant

## 2022-01-26 ENCOUNTER — Ambulatory Visit (HOSPITAL_COMMUNITY)
Admission: RE | Admit: 2022-01-26 | Discharge: 2022-01-26 | Disposition: A | Discharge status: Home / Self Care | Payer: Medicare Other | Source: Ambulatory Visit | Attending: Physician Assistant | Admitting: Physician Assistant

## 2022-01-26 VITALS — BP 170/110 | HR 99 | Ht 70.0 in | Wt 248.8 lb

## 2022-01-26 DIAGNOSIS — E119 Type 2 diabetes mellitus without complications: Secondary | ICD-10-CM | POA: Insufficient documentation

## 2022-01-26 DIAGNOSIS — Z6835 Body mass index (BMI) 35.0-35.9, adult: Secondary | ICD-10-CM | POA: Insufficient documentation

## 2022-01-26 DIAGNOSIS — Z7985 Long-term (current) use of injectable non-insulin antidiabetic drugs: Secondary | ICD-10-CM | POA: Insufficient documentation

## 2022-01-26 DIAGNOSIS — G4733 Obstructive sleep apnea (adult) (pediatric): Secondary | ICD-10-CM | POA: Diagnosis not present

## 2022-01-26 DIAGNOSIS — Z8249 Family history of ischemic heart disease and other diseases of the circulatory system: Secondary | ICD-10-CM | POA: Insufficient documentation

## 2022-01-26 DIAGNOSIS — Z87891 Personal history of nicotine dependence: Secondary | ICD-10-CM | POA: Insufficient documentation

## 2022-01-26 DIAGNOSIS — Z833 Family history of diabetes mellitus: Secondary | ICD-10-CM | POA: Insufficient documentation

## 2022-01-26 DIAGNOSIS — E785 Hyperlipidemia, unspecified: Secondary | ICD-10-CM | POA: Diagnosis not present

## 2022-01-26 DIAGNOSIS — Z794 Long term (current) use of insulin: Secondary | ICD-10-CM | POA: Insufficient documentation

## 2022-01-26 DIAGNOSIS — I10 Essential (primary) hypertension: Secondary | ICD-10-CM | POA: Insufficient documentation

## 2022-01-26 DIAGNOSIS — E669 Obesity, unspecified: Secondary | ICD-10-CM | POA: Diagnosis not present

## 2022-01-26 DIAGNOSIS — D6869 Other thrombophilia: Secondary | ICD-10-CM | POA: Diagnosis not present

## 2022-01-26 DIAGNOSIS — I480 Paroxysmal atrial fibrillation: Secondary | ICD-10-CM | POA: Diagnosis not present

## 2022-01-26 MED ORDER — APIXABAN 5 MG PO TABS: 5.0000 mg | ORAL_TABLET | Freq: Two times a day (BID) | ORAL | 11 refills | Status: DC

## 2022-02-02 DIAGNOSIS — G4733 Obstructive sleep apnea (adult) (pediatric): Secondary | ICD-10-CM | POA: Diagnosis not present

## 2022-02-17 DIAGNOSIS — E1165 Type 2 diabetes mellitus with hyperglycemia: Secondary | ICD-10-CM | POA: Diagnosis not present

## 2022-02-17 DIAGNOSIS — Z7985 Long-term (current) use of injectable non-insulin antidiabetic drugs: Secondary | ICD-10-CM | POA: Diagnosis not present

## 2022-02-17 DIAGNOSIS — Z83511 Family history of glaucoma: Secondary | ICD-10-CM | POA: Diagnosis not present

## 2022-02-17 DIAGNOSIS — H40013 Open angle with borderline findings, low risk, bilateral: Secondary | ICD-10-CM | POA: Diagnosis not present

## 2022-02-17 DIAGNOSIS — Z794 Long term (current) use of insulin: Secondary | ICD-10-CM | POA: Diagnosis not present

## 2022-02-20 DIAGNOSIS — Z794 Long term (current) use of insulin: Secondary | ICD-10-CM | POA: Diagnosis not present

## 2022-02-20 DIAGNOSIS — E1165 Type 2 diabetes mellitus with hyperglycemia: Secondary | ICD-10-CM | POA: Diagnosis not present

## 2022-02-20 DIAGNOSIS — Z7985 Long-term (current) use of injectable non-insulin antidiabetic drugs: Secondary | ICD-10-CM | POA: Diagnosis not present

## 2022-02-20 DIAGNOSIS — Z9641 Presence of insulin pump (external) (internal): Secondary | ICD-10-CM | POA: Diagnosis not present

## 2022-02-28 ENCOUNTER — Other Ambulatory Visit: Payer: Self-pay | Admitting: Cardiology

## 2022-02-28 ENCOUNTER — Encounter: Payer: Self-pay | Admitting: Pharmacist

## 2022-02-28 ENCOUNTER — Ambulatory Visit: Payer: 59 | Attending: Cardiology | Admitting: Pharmacist

## 2022-02-28 VITALS — BP 184/117 | HR 100

## 2022-02-28 DIAGNOSIS — I10 Essential (primary) hypertension: Secondary | ICD-10-CM | POA: Diagnosis not present

## 2022-02-28 DIAGNOSIS — Z794 Long term (current) use of insulin: Secondary | ICD-10-CM

## 2022-02-28 DIAGNOSIS — E1140 Type 2 diabetes mellitus with diabetic neuropathy, unspecified: Secondary | ICD-10-CM

## 2022-02-28 MED ORDER — AMLODIPINE-OLMESARTAN 10-40 MG PO TABS: 1.0000 | ORAL_TABLET | Freq: Every day | ORAL | 1 refills | Status: DC

## 2022-02-28 MED ORDER — HYDRALAZINE HCL 25 MG PO TABS: 25.0000 mg | ORAL_TABLET | Freq: Three times a day (TID) | ORAL | 1 refills | Status: DC

## 2022-02-28 NOTE — Progress Notes (Signed)
Patient ID: Danny Fuller                 DOB: 09/05/1975                      MRN: [id number]     HPI: Danny Fuller is a 46 y.o. male referred by Dr. Clint Fuller to HTN clinic. PMH is significant for A Fib, uncontrolled T2DM, HLD, OSA, and uncontrolled HTN.   Patient seen in afib clinic on 12/14 and BP elevated at 170/110. Had run out of many medications including insulin and Eliquis. Clonidine patches had previously been increased to 0.3mg weekly however BP remained elevated.  Patient presents today in good spirits. Reports blood sugar has started to come down now that he is back on his insulin and he feels better. However BP has remained variable. Forgot to bring home log but believes his reading this mornign was 150/102.  Has considerably reduced salt intake and has switched to decaf coffee which which he does not care for. Has pain in lower extremities due to neuropathy. Has a ten year old child and two older children so reports stress and worry.  Thinks he has been on his current antihypertensive regimen for too long and medications are no longer working. Was previously on hydralazine which he reports was effective. Reports compliance but occasionally forgets evening dose of labetalol.  Scr 1.29 on 9/23 which eGFR >60.  Current HTN meds:  Amlodipine/Benazepril 10/40mg daily Clonidine pathc 0.3mg weekly Labetalol 200mg BID  BP goal: <130/80  Wt Readings from Last 3 Encounters:  01/26/22 248 lb 12.8 oz (112.9 kg)  01/03/22 250 lb (113.4 kg)  11/18/21 252 lb (114.3 kg)   BP Readings from Last 3 Encounters:  02/28/22 (!) 184/117  01/26/22 (!) 170/110  11/18/21 (!) 160/106   Pulse Readings from Last 3 Encounters:  02/28/22 100  01/26/22 99  11/18/21 84    Renal function: CrCl cannot be calculated (Patient's most recent lab result is older than the maximum 21 days allowed.).  Past Medical History:  Diagnosis Date   Asthma    Back pain    Bulging Disk   Chronic  kidney disease    Diabetes mellitus    sees Danny Huffman NP at [facility]   Diabetic foot ulcers (HCC)    sees Dr. John Fuller    GERD (gastroesophageal reflux disease)    Glaucoma    Headache(784.0)    Hyperlipidemia    Hypertension    sees Dr. Hochrein    Sleep apnea    CPAP    Current Outpatient Medications on File Prior to Visit  Medication Sig Dispense Refill   apixaban (ELIQUIS) 5 MG TABS tablet Take 1 tablet (5 mg total) by mouth 2 (two) times daily. 60 tablet 11   cloNIDine (CATAPRES - DOSED IN MG/24 HR) 0.3 mg/24hr patch Place 1 patch (0.3 mg total) onto the skin once a week. 4 patch 11   Continuous Blood Gluc Sensor (DEXCOM G7 SENSOR) MISC 1 Application by Does not apply route every 14 (fourteen) days. 12 each 3   cyclobenzaprine (FLEXERIL) 10 MG tablet Take 1 tablet (10 mg total) by mouth 3 (three) times daily as needed for muscle spasms. 90 tablet 5   Dulaglutide (TRULICITY) 1.5 MG/0.5ML SOPN Trulicity 1.5 mg/0.5 mL subcutaneous pen injector 2 mL 1   glucose blood test strip 1 each by Other route 4 (four) times daily.        HUMALOG 100 UNIT/ML injection Inject into the skin as directed.     Insulin Disposable Pump (OMNIPOD DASH PODS, GEN 4,) MISC INJECT 1 EACH INTO THE SKIN CONTINUOUS. APPLY 1 OMNIPOD INSULIN PUMP TO BODY DAILY FOR INSULIN 30 each 1   Insulin Pen Needle 31G X 5 MM MISC 1 each by Other route daily.      labetalol (NORMODYNE) 200 MG tablet Take 1 tablet (200 mg total) by mouth 2 (two) times daily. 180 tablet 3   LYRICA 150 MG capsule Take 1 capsule (150 mg total) by mouth 3 (three) times daily. 90 capsule 5   nortriptyline (PAMELOR) 10 MG capsule TAKE 4 CAPSULES BY MOUTH AT BEDTIME 360 capsule 3   pravastatin (PRAVACHOL) 40 MG tablet Take 1 tablet (40 mg total) by mouth every evening. 90 tablet 3   traMADol (ULTRAM) 50 MG tablet Take 2 tablets (100 mg total) by mouth every 6 (six) hours as needed. 120 tablet 2   No current  facility-administered medications on file prior to visit.    Allergies  Allergen Reactions   Augmentin [Amoxicillin-Pot Clavulanate] Itching and Other (See Comments)    Dizziness   Vancomycin Other (See Comments)    AKI     Assessment/Plan:  1. Hypertension -  HYPERTENSION CONTROL Vitals:   02/28/22 0912 02/28/22 0920  BP: (!) 197/126 (!) 184/117    The patient's blood pressure is elevated above target today.  In order to address the patient's elevated BP: A current anti-hypertensive medication was adjusted today.; A new medication was prescribed today.    Patient BP in room elevated today at 184/117 which is similar to recent readings. Will switch amlodipine/benazapril to amlodipine/olmesartan for better control and add on hydralazine 25mg TID. Check BMP in 2 weeks. Advised patient to send in home readings through mychart and hydralazine dose can be titrated. May also need addition of thiazide depending on renal function or switch from labetalol to different BB.  Patient voiced understanding.  Continue clonidine 0.3mg weekly Continue labetalol 200mg BID Stop amlodipine/benazepril Start amlodipine/olmesartan 10/40mg daily Start hydralazine 25mg TID Check BMP in 2 weeks Recheck in clinic in 4 weeks  Danny Fuller, PharmD, BCACP, CDCES, CPP [address], [address] [place], [place], [address] Phone: [phone], Fax: [phone]  

## 2022-02-28 NOTE — Patient Instructions (Addendum)
It was nice meeting you today  We would like your blood pressure to be less than 130/80  Please continue your labetalol '200mg'$  twice a day and your clonidine patches weekly  We can stop amlodipine/benazapril at this time and will switch to amlodipine/olmesartan once daily.  Please recheck your labwork in about 2 weeks  We will also start a low dose hydralazine at 25 mg three times a day  Please send over your home readings and we can adjust your medications  Karren Cobble, PharmD, [place], [place], [place], [place], [place], [address] Phone: [phone], Fax: [phone]

## 2022-03-08 DIAGNOSIS — E1165 Type 2 diabetes mellitus with hyperglycemia: Secondary | ICD-10-CM | POA: Diagnosis not present

## 2022-03-16 DIAGNOSIS — G4733 Obstructive sleep apnea (adult) (pediatric): Secondary | ICD-10-CM | POA: Diagnosis not present

## 2022-03-17 ENCOUNTER — Other Ambulatory Visit (HOSPITAL_COMMUNITY): Payer: Self-pay

## 2022-03-17 DIAGNOSIS — Z89432 Acquired absence of left foot: Secondary | ICD-10-CM | POA: Diagnosis not present

## 2022-03-17 DIAGNOSIS — L84 Corns and callosities: Secondary | ICD-10-CM | POA: Diagnosis not present

## 2022-03-17 DIAGNOSIS — E1142 Type 2 diabetes mellitus with diabetic polyneuropathy: Secondary | ICD-10-CM | POA: Diagnosis not present

## 2022-03-17 MED ORDER — APIXABAN 5 MG PO TABS: 5.0000 mg | ORAL_TABLET | Freq: Two times a day (BID) | ORAL | 0 refills | Status: DC

## 2022-03-22 ENCOUNTER — Other Ambulatory Visit (HOSPITAL_COMMUNITY): Payer: Self-pay | Admitting: *Deleted

## 2022-03-22 ENCOUNTER — Telehealth (HOSPITAL_COMMUNITY): Payer: Self-pay | Admitting: *Deleted

## 2022-03-22 DIAGNOSIS — I10 Essential (primary) hypertension: Secondary | ICD-10-CM

## 2022-03-22 NOTE — Telephone Encounter (Signed)
Reaching out to patient to offer assistance regarding upcoming cardiac imaging study; pt verbalizes understanding of appt date/time, parking situation and where to check in, and verified current allergies; name and call back number provided for further questions should they arise  Gordy Clement RN Navigator Cardiac Imaging [facility] [phone] office [phone] cell  Patient aware to obtain labs prior to MRI and to avoid his Dexcom monitor until after MRI. He denies metal or claustrophobia.

## 2022-03-22 NOTE — Telephone Encounter (Signed)
Attempted to call patient regarding upcoming cardiac MRI appointment and reminder for blood work. Left message on voicemail with name and callback number  Gordy Clement RN Navigator Cardiac [facility] [phone] Office [phone] Cell

## 2022-03-23 DIAGNOSIS — I10 Essential (primary) hypertension: Secondary | ICD-10-CM | POA: Diagnosis not present

## 2022-03-24 ENCOUNTER — Ambulatory Visit (HOSPITAL_COMMUNITY)
Admission: RE | Admit: 2022-03-24 | Discharge: 2022-03-24 | Disposition: A | Discharge status: Home / Self Care | Payer: 59 | Source: Ambulatory Visit | Attending: Cardiology | Admitting: Cardiology

## 2022-03-24 ENCOUNTER — Other Ambulatory Visit: Payer: Self-pay | Admitting: Cardiology

## 2022-03-24 DIAGNOSIS — I517 Cardiomegaly: Secondary | ICD-10-CM

## 2022-03-24 LAB — HEMOGLOBIN AND HEMATOCRIT, BLOOD
Hematocrit: 49.3 % (ref 37.5–51.0)
Hemoglobin: 16.6 g/dL (ref 13.0–17.7)

## 2022-03-24 MED ORDER — GADOBUTROL 1 MMOL/ML IV SOLN
10.0000 mL | Freq: Once | INTRAVENOUS | Status: AC | PRN
  Administered 2022-03-24: 10 mL via INTRAVENOUS

## 2022-03-30 ENCOUNTER — Encounter: Payer: Self-pay | Admitting: *Deleted

## 2022-03-31 ENCOUNTER — Ambulatory Visit: Payer: 59 | Attending: Internal Medicine

## 2022-03-31 NOTE — Progress Notes (Deleted)
Patient ID: Danny Fuller                 DOB: 1975/06/25                      MRN: [id number]     HPI: Danny Fuller is a 47 y.o. male referred by Dr. Malka So to HTN clinic. PMH is significant for A Fib, uncontrolled T2DM, HLD, OSA, and uncontrolled HTN.   Patient seen in afib clinic on 12/14 and BP elevated at 170/110. Had run out of many medications including insulin and Eliquis. Clonidine patches had previously been increased to 0.74m weekly however BP remained elevated.  Patient presents today in good spirits. Reports blood sugar has started to come down now that he is back on his insulin and he feels better. However BP has remained variable. Forgot to bring home log but believes his reading this mornign was 150/102.  Has considerably reduced salt intake and has switched to decaf coffee which which he does not care for. Has pain in lower extremities due to neuropathy. Has a t41year old child and two older children so reports stress and worry.  Thinks he has been on his current antihypertensive regimen for too long and medications are no longer working. Was previously on hydralazine which he reports was effective. Reports compliance but occasionally forgets evening dose of labetalol.  Scr 1.29 on 9/23 which eGFR >60.  Current HTN meds:  Amlodipine/Benazepril 10/441mdaily Clonidine pathc 0.46m37meekly Labetalol 200m28mD  BP goal: <130/80  Wt Readings from Last 3 Encounters:  01/26/22 248 lb 12.8 oz (112.9 kg)  01/03/22 250 lb (113.4 kg)  11/18/21 252 lb (114.3 kg)   BP Readings from Last 3 Encounters:  02/28/22 (!) 184/117  01/26/22 (!) 170/110  11/18/21 (!) 160/106   Pulse Readings from Last 3 Encounters:  02/28/22 100  01/26/22 99  11/18/21 84    Renal function: CrCl cannot be calculated (Patient's most recent lab result is older than the maximum 21 days allowed.).  Past Medical History:  Diagnosis Date   Asthma    Back pain    Bulging Disk   Chronic  kidney disease    Diabetes mellitus    [facility]   [facility]. JohnWylene SimmerGERD (gastroesophageal reflux disease)    Glaucoma    Headache(784.0)    Hyperlipidemia    Hypertension    sees Dr. HochPercival SpanishSleep apnea    CPAP    Current Outpatient Medications on File Prior to Visit  Medication Sig Dispense Refill   apixaban (ELIQUIS) 5 MG TABS tablet Take 1 tablet (5 mg total) by mouth 2 (two) times daily. 60 tablet 11   cloNIDine (CATAPRES - DOSED IN MG/24 HR) 0.3 mg/24hr patch Place 1 patch (0.3 mg total) onto the skin once a week. 4 patch 11   Continuous Blood Gluc Sensor (DEXCOM G7 SENSOR) MISC 1 Application by Does not apply route every 14 (fourteen) days. 12 each 3   cyclobenzaprine (FLEXERIL) 10 MG tablet Take 1 tablet (10 mg total) by mouth 3 (three) times daily as needed for muscle spasms. 90 tablet 5   Dulaglutide (TRULICITY) 1.5 MG/00000000N Trulicity 1.5 mg/099991111subcutaneous pen injector 2 mL 1   glucose blood test strip 1 each by Other route 4 (four) times daily.  HUMALOG 100 UNIT/ML injection Inject into the skin as directed.     Insulin Disposable Pump (OMNIPOD DASH PODS, GEN 4,) MISC INJECT 1 EACH INTO THE SKIN CONTINUOUS. APPLY 1 OMNIPOD INSULIN PUMP TO BODY DAILY FOR INSULIN 30 each 1   Insulin Pen Needle 31G X 5 MM MISC 1 each by Other route daily.      labetalol (NORMODYNE) 200 MG tablet Take 1 tablet (200 mg total) by mouth 2 (two) times daily. 180 tablet 3   LYRICA 150 MG capsule Take 1 capsule (150 mg total) by mouth 3 (three) times daily. 90 capsule 5   nortriptyline (PAMELOR) 10 MG capsule TAKE 4 CAPSULES BY MOUTH AT BEDTIME 360 capsule 3   pravastatin (PRAVACHOL) 40 MG tablet Take 1 tablet (40 mg total) by mouth every evening. 90 tablet 3   traMADol (ULTRAM) 50 MG tablet Take 2 tablets (100 mg total) by mouth every 6 (six) hours as needed. 120 tablet 2   No current  facility-administered medications on file prior to visit.    Allergies  Allergen Reactions   Augmentin [Amoxicillin-Pot Clavulanate] Itching and Other (See Comments)    Dizziness   Vancomycin Other (See Comments)    AKI     Assessment/Plan:  1. Hypertension -  HYPERTENSION CONTROL Vitals:   02/28/22 0912 02/28/22 0920  BP: (!) 197/126 (!) 184/117    The patient's blood pressure is elevated above target today.  In order to address the patient's elevated BP: A current anti-hypertensive medication was adjusted today.; A new medication was prescribed today.    Patient BP in room elevated today at 184/117 which is similar to recent readings. Will switch amlodipine/benazapril to amlodipine/olmesartan for better control and add on hydralazine 41m TID. Check BMP in 2 weeks. Advised patient to send in home readings through mychart and hydralazine dose can be titrated. May also need addition of thiazide depending on renal function or switch from labetalol to different BB.  Patient voiced understanding.  Continue clonidine 0.339mweekly Continue labetalol 20025mID Stop amlodipine/benazepril Start amlodipine/olmesartan 10/36m15mily Start hydralazine 25mg89m Check BMP in 2 weeks Recheck in clinic in 4 weeks  ChrisKarren CobblermD, BCACPFordvilleESPonderosa Pine 3[id number]: [phone]

## 2022-04-28 ENCOUNTER — Telehealth: Payer: Self-pay | Admitting: Family Medicine

## 2022-04-28 NOTE — Telephone Encounter (Signed)
Contacted Danny Fuller to schedule their annual wellness visit. Appointment made for 05/09/22.  Danny Fuller AWV direct phone # [phone]   NHA out of office on 3/22  r/s appt to 3/26 pt aware of appt date/time change

## 2022-05-02 ENCOUNTER — Telehealth: Payer: 59 | Admitting: Cardiology

## 2022-05-04 DIAGNOSIS — G4733 Obstructive sleep apnea (adult) (pediatric): Secondary | ICD-10-CM | POA: Diagnosis not present

## 2022-05-09 ENCOUNTER — Telehealth (INDEPENDENT_AMBULATORY_CARE_PROVIDER_SITE_OTHER): Payer: 59 | Admitting: Family Medicine

## 2022-05-09 ENCOUNTER — Encounter: Payer: Self-pay | Admitting: Family Medicine

## 2022-05-09 VITALS — BP 191/96 | Wt 246.0 lb

## 2022-05-09 DIAGNOSIS — Z0000 Encounter for general adult medical examination without abnormal findings: Secondary | ICD-10-CM | POA: Diagnosis not present

## 2022-05-09 NOTE — Progress Notes (Signed)
PATIENT CHECK-IN and HEALTH RISK ASSESSMENT QUESTIONNAIRE:  -completed by phone/video for upcoming Medicare Preventive Visit  Pre-Visit Check-in: 1)Vitals (height, wt, BP, etc) - record in vitals section for visit on day of visit 2)Review and Update Medications, Allergies PMH, Surgeries, Social history in Epic 3)Hospitalizations in the last year with date/reason?   4)Review and Update Care Team (patient's specialists) in Epic 5) Complete PHQ9 in Epic  6) Complete Fall Screening in Epic 7)Review all Health Maintenance Due and order under PCP if not done.  Medicare Wellness Patient Questionnaire:  Answer theses question about your habits: Do you drink alcohol? Rarely with holidays If yes, how many drinks do you have a day? Have you ever smoked?yes Quit date if applicable? 2019-approximately 3-4 years ago  How many packs a day do/did you smoke? 1 pack a day previously Do you use smokeless tobacco?no Do you use an illicit drugs?no Do you exercises? Yes IF so, what type and how many days/minutes per week? Walking on track-3x a week - 30-40 minutes walking Are you sexually active? Yes Number of partners?1 Typical breakfast-Nutrigrain bar, boiled egg and [place] bacon Typical lunch-salads Typical dinner-baked chicken Typical snacks:-popcorn  Beverages: Gatorlyte beverages, tea  Answer theses question about you: Can you perform most household chores?yes Do you find it hard to follow a conversation in a noisy room?no Do you often ask people to speak up or repeat themselves?no Do you feel that you have a problem with memory?no Do you balance your checkbook and or bank acounts?yes Do you feel safe at home?yes Last dentist visit?3 years ago Do you need assistance with any of the following: Please note if so   Driving?no  Feeding yourself?no  Getting from bed to chair?no  Getting to the toilet?no  Bathing or showering?no  Dressing yourself?no  Managing money?no  Climbing a flight of  stairs-no  Preparing meals? no    Do you have Advanced Directives in place (Living Will, Healthcare Power or Attorney)? no   Last eye Exam and location?December 2023-[facility] you currently use prescribed or non-prescribed narcotic or opioid pain medications?yes  Do you have a history or close family history of breast, ovarian, tubal or peritoneal cancer or a family member with BRCA (breast cancer susceptibility 1 and 2) gene mutations? no  Nurse/Assistant Credentials/time stamp: Luellen Pucker   ----------------------------------------------------------------------------------------------------------------------------------------------------------------------------------------------------------------------    [place] (Welcome to Medicare, initial annual wellness or annual wellness exam)  Virtual Visit via Video Note  I connected with Danny Fuller on 05/09/22  by  a video enabled telemedicine application and verified that I am speaking with the correct person using two identifiers.  Location patient: home Location provider:work or home office Persons participating in the virtual visit: patient, provider  Concerns and/or follow up today: none, reports all is stable   See HM section in Epic for other details of completed HM.    ROS: negative for report of fevers, unintentional weight loss, vision changes, vision loss, hearing loss or change, chest pain, sob, hemoptysis, melena, hematochezia, hematuria, falls, bleeding or bruising, loc, thoughts of suicide or self harm, memory loss  Patient-completed extensive health risk assessment - reviewed and discussed with the patient: See Health Risk Assessment completed with patient prior to the visit either above or in recent phone note. This was reviewed in detailed with the patient today and appropriate recommendations, orders and referrals were placed as needed per  Summary below and patient instructions.  Review of Medical History: -PMH, PSH, Family History and current specialty and care providers reviewed and updated and listed below   Patient Care Team: [name], MD as PCP - General (Family Medicine) Minus Breeding, MD as PCP - Cardiology (Cardiology) Alda Berthold, DO as Consulting Physician (Neurology)   Past Medical History:  Diagnosis Date   Asthma    Back pain    Bulging Disk   Chronic kidney disease    Diabetes mellitus    sees [name] NP at [facility] End[facility]ology   Diabetic foot ulcers [facility])    sees Dr. Wylene Simmer    GERD (gastroesophageal reflux disease)    Glaucoma    Headache(784.0)    Hyperlipidemia    Hypertension    sees Dr. Percival Spanish    Sleep apnea    CPAP    Past Surgical History:  Procedure Laterality Date   AMPUTATION Left 07/26/2015   Procedure: AMPUTATION LEFT FIFTH RAY;  Surgeon: [name], MD;  Location: [facility];  Service: Orthopedics;  Laterality: Left;   bilateral hip pins placed     INCISION AND DRAINAGE PERIRECTAL ABSCESS Right 03/07/2016   Procedure: IRRIGATION AND DEBRIDEMENT PERIRECTAL ABSCESS;  Surgeon: Alphonsa Overall, MD;  Location: [facility];  Service: General;  Laterality: Right;   INCISION AND DRAINAGE PERIRECTAL ABSCESS Right 03/09/2016   Procedure: EXAM UNDER ANESTHESIA, IRRIGATION AND DEBRIDEMENT PERIRECTAL ABSCESS;  Surgeon: Johnathan Hausen, MD;  Location: [facility];  Service: General;  Laterality: Right;  Open, betadine packed wound   TOE AMPUTATION Left 11/19/2019   Removal of all toes from left foot   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/20/2019   Procedure: TRANSMETATARSAL AMPUTATION LEFT FOOT;  Surgeon: Wylene Simmer, MD;  Location: [facility];  Service: Orthopedics;  Laterality: Left;    Social History   Socioeconomic History   Marital status: Married    Spouse name: Not on file   Number of children: 2   Years of education: 12   Highest education level: 12th grade  Occupational  History   Occupation: disability  Tobacco Use   Smoking status: Former    Packs/day: 0.50    Years: 20.00    Additional pack years: 0.00    Total pack years: 10.00    Types: Cigarettes    Quit date: 07/23/2015    Years since quitting: 6.8   Smokeless tobacco: Never   Tobacco comments:    Former smoker 10/27/21  Vaping Use   Vaping Use: Never used  Substance and Sexual Activity   Alcohol use: Yes    Alcohol/week: 1.0 standard drink of alcohol    Types: 1 Standard drinks or equivalent per week    Comment: 1 drink every 1-3 months 10/27/21   Drug use: Never   Sexual activity: Not on file  Other Topics Concern   Not on file  Social History Narrative   Lives with wife and two children in a one story home.  Not working.  Still trying for disability.     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     Education: high school.    Left handed   One story home   Social Determinants of Health   Financial Resource Strain: Low Risk  (05/03/2021)   Overall Financial Resource Strain (CARDIA)    Difficulty of Paying Living Expenses: Not hard at all  Food Insecurity: No Food Insecurity (05/03/2021)   Hunger Vital Sign    Worried About Running Out of Food in the  Last Year: Never true    [place] in the Last Year: Never true  Transportation Needs: No Transportation Needs (05/03/2021)   PRAPARE - Hydrologist (Medical): No    Lack of Transportation (Non-Medical): No  Physical Activity: Insufficiently Active (05/03/2021)   Exercise Vital Sign    Days of Exercise per Week: 4 days    Minutes of Exercise per Session: 30 min  Stress: No Stress Concern Present (05/03/2021)   [place]    Feeling of Stress : Not at all  Social Connections: [place] (05/03/2021)   Social Connection and Isolation Panel [NHANES]    Frequency of Communication with Friends and Family: More than  three times a week    Frequency of Social Gatherings with Friends and Family: More than three times a week    Attends Religious Services: More than 4 times per year    Active Member of [employer] or Organizations: Yes    Attends Music therapist: More than 4 times per year    Marital Status: Married  Human resources officer Violence: Not At Risk (05/03/2021)   Humiliation, Afraid, Rape, and Kick questionnaire    Fear of Current or Ex-Partner: No    Emotionally Abused: No    Physically Abused: No    Sexually Abused: No    Family History  Problem Relation Age of Onset   Arthritis Other    Diabetes Other    Hypertension Other    Hyperlipidemia Other    Stroke Other    Sudden death Other    Diabetes Mother    Diabetes Sister    Diabetes Brother    Heart attack Father        Died ate 20, couple of "heart attacks"     Current Outpatient Medications on File Prior to Visit  Medication Sig Dispense Refill   amLODipine-olmesartan (AZOR) 10-40 MG tablet Take 1 tablet by mouth daily. 90 tablet 1   apixaban (ELIQUIS) 5 MG TABS tablet Take 1 tablet (5 mg total) by mouth 2 (two) times daily. 14 tablet 0   cloNIDine (CATAPRES - DOSED IN MG/24 HR) 0.3 mg/24hr patch Place 1 patch (0.3 mg total) onto the skin once a week. 4 patch 11   cyclobenzaprine (FLEXERIL) 10 MG tablet Take 1 tablet (10 mg total) by mouth 3 (three) times daily as needed for muscle spasms. 90 tablet 5   Dulaglutide (TRULICITY) 1.5 0000000 SOPN Trulicity 1.5 99991111 mL subcutaneous pen injector 2 mL 1   furosemide (LASIX) 20 MG tablet 20 mg by oral route.     glucose blood test strip 1 each by Other route 4 (four) times daily.      HUMALOG 100 UNIT/ML injection Inject into the skin as directed.     hydrALAZINE (APRESOLINE) 25 MG tablet Take 1 tablet (25 mg total) by mouth 3 (three) times daily. 180 tablet 5   HYDROcodone-acetaminophen (NORCO) 10-325 MG tablet TAKE 1 TABLET BY MOUTH EVERY 4 TO 6 HOURS FOR 5 DAYS AS NEEDED      insulin aspart (NOVOLOG) 100 UNIT/ML injection USE MAX OF 0.6 MLS PER DAY IN INSULIN PUMP     Insulin Disposable Pump (OMNIPOD DASH PODS, GEN 4,) MISC INJECT 1 EACH INTO THE SKIN CONTINUOUS. APPLY 1 OMNIPOD INSULIN PUMP TO BODY DAILY FOR INSULIN 30 each 1   Insulin Pen Needle 31G X 5 MM MISC 1 each by Other route daily.  labetalol (NORMODYNE) 200 MG tablet Take 1 tablet (200 mg total) by mouth 2 (two) times daily. 180 tablet 3   LYRICA 150 MG capsule Take 1 capsule (150 mg total) by mouth 3 (three) times daily. 90 capsule 5   nortriptyline (PAMELOR) 10 MG capsule TAKE 4 CAPSULES BY MOUTH AT BEDTIME 360 capsule 3   pravastatin (PRAVACHOL) 40 MG tablet Take 1 tablet (40 mg total) by mouth every evening. 90 tablet 3   traMADol (ULTRAM) 50 MG tablet Take 2 tablets (100 mg total) by mouth every 6 (six) hours as needed. 120 tablet 2   No current facility-administered medications on file prior to visit.    Allergies  Allergen Reactions   Augmentin [Amoxicillin-Pot Clavulanate] Itching and Other (See Comments)    Dizziness   Vancomycin Other (See Comments)    AKI       Physical Exam Vitals:   05/09/22 1133  BP: (!) 191/96   Estimated body mass index is 35.3 kg/m as calculated from the following:   Height as of 01/26/22: 5\' 10"  (1.778 m).   Weight as of this encounter: 246 lb (111.6 kg).  EKG (optional): deferred due to virtual visit  GENERAL: alert, oriented, no acute distress detected; full vision exam deferred due to pandemic and/or virtual encounter  HEENT: atraumatic, conjunttiva clear, no obvious abnormalities on inspection of external nose and ears  NECK: normal movements of the head and neck  LUNGS: on inspection no signs of respiratory distress, breathing rate appears normal, no obvious gross SOB, gasping or wheezing  CV: no obvious cyanosis  MS: moves all visible extremities without noticeable abnormality  PSYCH/NEURO: pleasant and cooperative, no obvious depression  or anxiety, speech and thought processing grossly intact, Cognitive function grossly intact  Flowsheet Row Video Visit from 05/09/2022 in [facility]  PHQ-9 Total Score 2           05/09/2022   11:35 AM 11/18/2021    9:06 AM 09/28/2021   10:39 AM 05/03/2021    9:53 AM 02/04/2020    9:56 AM  Depression screen PHQ 2/9  Decreased Interest 1 1 1  0 0  Down, Depressed, Hopeless 0 0 0 0 0  PHQ - 2 Score 1 1 1  0 0  Altered sleeping 0 0 0    Tired, decreased energy 1 1 1     Change in appetite 0 0 0    Feeling bad or failure about yourself  0 0 0    Trouble concentrating 0 0 0    Moving slowly or fidgety/restless 0 0 0    Suicidal thoughts 0 0 0    PHQ-9 Score 2 2 2     Difficult doing work/chores  Not difficult at all Not difficult at all  Not difficult at all       05/03/2021    9:55 AM 09/28/2021   10:38 AM 10/21/2021   11:14 AM 11/18/2021    9:06 AM 05/09/2022   11:36 AM  Fall Risk  Falls in the past year? 0 1  1 1   Was there an injury with Fall? 0 0  1 0  Fall Risk Category Calculator 0 1  3 2   Fall Risk Category (Retired) Low Low  High   (RETIRED) Patient Fall Risk Level Low fall risk Low fall risk Moderate fall risk Low fall risk   Patient at Risk for Falls Due to No Fall Risks No Fall Risks  No Fall Risks History of fall(s)  Fall risk Follow up  Falls evaluation completed  Falls evaluation completed Falls evaluation completed     SUMMARY AND PLAN:  Encounter for Medicare annual wellness exam   Discussed applicable health maintenance/preventive health measures and advised and referred or ordered per patient preferences:  Health Maintenance  Topic Date Due   Hepatitis C Screening  Never done, discussed, he is considering, advised to check with his insurance on coverage if wishes to do   Diabetic kidney evaluation - Urine ACR  06/04/2020 - he reports he will see his kidney doctor soon   INFLUENZA VACCINE  05/14/2022 (Originally 09/13/2021) -  can get at pharmacy or office yearly   COVID-19 Vaccine (3 - 2023-24 season) 05/25/2022 (Originally 10/14/2021) - can get at pharmacy when due   HEMOGLOBIN A1C  05/20/2022   Diabetic kidney evaluation - eGFR measurement  10/22/2022   OPHTHALMOLOGY EXAM  01/14/2023   FOOT EXAM  03/17/2023   Medicare Annual Wellness (AWV)  05/09/2023   DTaP/Tdap/Td (2 - Td or Tdap) 05/08/2029   HIV Screening  Completed   HPV VACCINES  Aged [employer] and counseling on the following was provided based on the above review of health and a plan/checklist for the patient, along with additional information discussed, was provided for the patient in the patient instructions :  -Advised on importance of and resources for completing advanced directives -Advised and counseled on maintaining healthy weight and healthy lifestyle - including the importance of a healthy diet, regular physical activity, social connections and stress management. -Advised and counseled on a whole foods based healthy diet - A summary of a healthy diet was provided in the Patient Instructions - Recommended regular exercise and discussed options within the community. Exercise guidelines and cautions reviewed and provided in patient instructions. -Advised yearly dental visits at minimum and regular eye exams -Advised and counseled on alcohol use and limits. Advised to schedule regular follow up with PCP and sent message to PCP office.   Follow up: see patient instructions   Patient Instructions  I really enjoyed getting to talk with you today! I am available on Tuesdays and Thursdays for virtual visits if you have any questions or concerns, or if I can be of any further assistance.   CHECKLIST FROM ANNUAL WELLNESS VISIT:  -Follow up (please call to schedule if not scheduled after visit):  -Inperson visit with your Primary Doctor office: Please schedule a follow up with your doctor in the office in the next 1-2 months -yearly for annual  wellness visit with primary care office  Here is a list of your preventive care/health maintenance measures and the plan for each if any are due:  Health Maintenance  Topic Date Due   Hepatitis C Screening  Never done, can request with labs if you wish to do   Diabetic kidney evaluation - Urine ACR  06/04/2020, see your kidney doctor as planned   INFLUENZA VACCINE  Can get theses at the pharmacy when due   COVID-19 Vaccine (3 - 2023-24 season) Can get theses at the pharmacy when due   HEMOGLOBIN A1C  05/20/2022   Diabetic kidney evaluation - eGFR measurement  10/22/2022   OPHTHALMOLOGY EXAM  01/14/2023   FOOT EXAM  03/17/2023   Medicare Annual Wellness (AWV)  05/09/2023   DTaP/Tdap/Td (2 - Td or Tdap) 05/08/2029   HIV Screening  Completed   HPV VACCINES  Aged Out    -See a dentist at least yearly  -Get your  eyes checked and then per your eye specialist's recommendations  -Other issues addressed today:   -I have included below further information regarding a healthy whole foods based diet, physical activity guidelines for adults, stress management and opportunities for social connections. I hope you find this information useful.   -----------------------------------------------------------------------------------------------------------------------------------------------------------------------------------------------------------------------------------------------------------  NUTRITION: -eat real food: lots of colorful vegetables (half the plate) and fruits -5-7 servings of vegetables and fruits per day (fresh or steamed is best), exp. 2 servings of vegetables with lunch and dinner and 2 servings of fruit per day. Berries and greens such as kale and collards are great choices.  -consume on a regular basis: whole grains (make sure first ingredient on label contains the word "whole"), fresh fruits, fish, nuts, seeds, healthy oils (such as olive oil, avocado oil, grape seed  oil) -may eat small amounts of dairy and lean meat on occasion, but avoid processed meats such as ham, bacon, lunch meat, etc. -drink water -try to avoid fast food and pre-packaged foods, processed meat -most experts advise limiting sodium to < 2300mg  per day, should limit further is any chronic conditions such as high blood pressure, heart disease, diabetes, etc. The American Heart Association advised that < 1500mg  is is ideal -try to avoid foods that contain any ingredients with names you do not recognize  -try to avoid sugar/sweets (except for the natural sugar that occurs in fresh fruit) -try to avoid sweet drinks -try to avoid white rice, white bread, pasta (unless whole grain), white or yellow potatoes  EXERCISE GUIDELINES FOR ADULTS: -if you wish to increase your physical activity, do so gradually and with the approval of your doctor -STOP and seek medical care immediately if you have any chest pain, chest discomfort or trouble breathing when starting or increasing exercise  -move and stretch your body, legs, feet and arms when sitting for long periods -Physical activity guidelines for optimal health in adults: -least 150 minutes per week of aerobic exercise (can talk, but not sing) once approved by your doctor, 20-30 minutes of sustained activity or two 10 minute episodes of sustained activity every day.  -resistance training at least 2 days per week if approved by your doctor -balance exercises 3+ days per week:   Stand somewhere where you have something sturdy to hold onto if you lose balance.    1) lift up on toes, start with 5x per day and work up to 20x   2) stand and lift on leg straight out to the side so that foot is a few inches of the floor, start with 5x each side and work up to 20x each side   3) stand on one foot, start with 5 seconds each side and work up to 20 seconds on each side  If you need ideas or help with getting more active:  -YouTube has lots of exercise  videos for different ages and abilities as well  -Walk with a Doc: [email or web address]-[email or web address]    STRESS MANAGEMENT: -can try meditating, or just sitting quietly with deep breathing while intentionally relaxing all parts of your body for 5 minutes daily -if you need further help with stress, anxiety or depression please follow up with your primary doctor or contact the wonderful folks at [place]: [place]: -options in [place] if you wish to engage in more social and exercise related activities:  -Walk with a Doc: [email or web address]-[email or web address]  -[place] (a variety of indoor and outdoor inperson activities for adults). [phone]. [address].  -consider volunteering at a school, hospice center, church, senior center or elsewhere           Lucretia Kern, DO

## 2022-05-09 NOTE — Patient Instructions (Addendum)
I really enjoyed getting to talk with you today! I am available on Tuesdays and Thursdays for virtual visits if you have any questions or concerns, or if I can be of any further assistance.   CHECKLIST FROM ANNUAL WELLNESS VISIT:  -Follow up (please call to schedule if not scheduled after visit):  -Inperson visit with your Primary Doctor office: Please schedule a follow up with your doctor in the office in the next 1-2 months -yearly for annual wellness visit with primary care office  Here is a list of your preventive care/health maintenance measures and the plan for each if any are due:  Health Maintenance  Topic Date Due   Hepatitis C Screening  Never done, can request with labs if you wish to do   Diabetic kidney evaluation - Urine ACR  06/04/2020, see your kidney doctor as planned   INFLUENZA VACCINE  Can get theses at the pharmacy when due   COVID-19 Vaccine (3 - 2023-24 season) Can get theses at the pharmacy when due   HEMOGLOBIN A1C  05/20/2022   Diabetic kidney evaluation - eGFR measurement  10/22/2022   OPHTHALMOLOGY EXAM  01/14/2023   FOOT EXAM  03/17/2023   Medicare Annual Wellness (AWV)  05/09/2023   DTaP/Tdap/Td (2 - Td or Tdap) 05/08/2029   HIV Screening  Completed   HPV VACCINES  Aged Out    -See a dentist at least yearly  -Get your eyes checked and then per your eye specialist's recommendations  -Other issues addressed today:   -I have included below further information regarding a healthy whole foods based diet, physical activity guidelines for adults, stress management and opportunities for social connections. I hope you find this information useful.   -----------------------------------------------------------------------------------------------------------------------------------------------------------------------------------------------------------------------------------------------------------  NUTRITION: -eat real food: lots of colorful vegetables (half  the plate) and fruits -5-7 servings of vegetables and fruits per day (fresh or steamed is best), exp. 2 servings of vegetables with lunch and dinner and 2 servings of fruit per day. Berries and greens such as kale and collards are great choices.  -consume on a regular basis: whole grains (make sure first ingredient on label contains the word "whole"), fresh fruits, fish, nuts, seeds, healthy oils (such as olive oil, avocado oil, grape seed oil) -may eat small amounts of dairy and lean meat on occasion, but avoid processed meats such as ham, bacon, lunch meat, etc. -drink water -try to avoid fast food and pre-packaged foods, processed meat -most experts advise limiting sodium to < 2300mg  per day, should limit further is any chronic conditions such as high blood pressure, heart disease, diabetes, etc. The American Heart Association advised that < 1500mg  is is ideal -try to avoid foods that contain any ingredients with names you do not recognize  -try to avoid sugar/sweets (except for the natural sugar that occurs in fresh fruit) -try to avoid sweet drinks -try to avoid white rice, white bread, pasta (unless whole grain), white or yellow potatoes  EXERCISE GUIDELINES FOR ADULTS: -if you wish to increase your physical activity, do so gradually and with the approval of your doctor -STOP and seek medical care immediately if you have any chest pain, chest discomfort or trouble breathing when starting or increasing exercise  -move and stretch your body, legs, feet and arms when sitting for long periods -Physical activity guidelines for optimal health in adults: -least 150 minutes per week of aerobic exercise (can talk, but not sing) once approved by your doctor, 20-30 minutes of sustained activity or two 10  minute episodes of sustained activity every day.  -resistance training at least 2 days per week if approved by your doctor -balance exercises 3+ days per week:   Stand somewhere where you have  something sturdy to hold onto if you lose balance.    1) lift up on toes, start with 5x per day and work up to 20x   2) stand and lift on leg straight out to the side so that foot is a few inches of the floor, start with 5x each side and work up to 20x each side   3) stand on one foot, start with 5 seconds each side and work up to 20 seconds on each side  If you need ideas or help with getting more active:  -YouTube has lots of exercise videos for different ages and abilities as well  -Walk with a Doc: [email or web address]-[email or web address]    STRESS MANAGEMENT: -can try meditating, or just sitting quietly with deep breathing while intentionally relaxing all parts of your body for 5 minutes daily -if you need further help with stress, anxiety or depression please follow up with your primary doctor or contact the wonderful folks at [place]: [place]: -options in [place] if you wish to engage in more social and exercise related activities:  -Walk with a Doc: [email or web address]-[email or web address]  -[place] (a variety of indoor and outdoor inperson activities for adults). [phone]. [phone].  -consider volunteering at a school, hospice center, church, senior center or elsewhere

## 2022-05-15 DIAGNOSIS — E13621 Other specified diabetes mellitus with foot ulcer: Secondary | ICD-10-CM | POA: Diagnosis not present

## 2022-05-15 DIAGNOSIS — L84 Corns and callosities: Secondary | ICD-10-CM | POA: Diagnosis not present

## 2022-05-15 DIAGNOSIS — Z89439 Acquired absence of unspecified foot: Secondary | ICD-10-CM | POA: Diagnosis not present

## 2022-05-18 ENCOUNTER — Encounter: Payer: Self-pay | Admitting: Family Medicine

## 2022-05-18 ENCOUNTER — Ambulatory Visit (INDEPENDENT_AMBULATORY_CARE_PROVIDER_SITE_OTHER): Payer: 59 | Admitting: Family Medicine

## 2022-05-18 VITALS — BP 142/102 | HR 85 | Temp 98.3°F | Wt 257.0 lb

## 2022-05-18 DIAGNOSIS — I480 Paroxysmal atrial fibrillation: Secondary | ICD-10-CM

## 2022-05-18 DIAGNOSIS — L84 Corns and callosities: Secondary | ICD-10-CM

## 2022-05-18 DIAGNOSIS — F418 Other specified anxiety disorders: Secondary | ICD-10-CM

## 2022-05-18 DIAGNOSIS — E1140 Type 2 diabetes mellitus with diabetic neuropathy, unspecified: Secondary | ICD-10-CM

## 2022-05-18 DIAGNOSIS — I10 Essential (primary) hypertension: Secondary | ICD-10-CM

## 2022-05-18 DIAGNOSIS — Z794 Long term (current) use of insulin: Secondary | ICD-10-CM

## 2022-05-18 DIAGNOSIS — G63 Polyneuropathy in diseases classified elsewhere: Secondary | ICD-10-CM

## 2022-05-18 DIAGNOSIS — M25572 Pain in left ankle and joints of left foot: Secondary | ICD-10-CM | POA: Diagnosis not present

## 2022-05-18 LAB — POCT GLYCOSYLATED HEMOGLOBIN (HGB A1C): Hemoglobin A1C: 8.2 % — AB (ref 4.0–5.6)

## 2022-05-18 MED ORDER — SERTRALINE HCL 50 MG PO TABS: 50.0000 mg | ORAL_TABLET | Freq: Every day | ORAL | 3 refills | Status: DC

## 2022-05-18 MED ORDER — TRAMADOL HCL 50 MG PO TABS: 100.0000 mg | ORAL_TABLET | Freq: Four times a day (QID) | ORAL | 5 refills | Status: DC | PRN

## 2022-05-18 NOTE — Progress Notes (Signed)
   Subjective:    Patient ID: Danny Fuller, male    DOB: 07-17-1975, 47 y.o.   MRN: [id number]  HPI Here for several issues. He has been seeing [name] FNP at [facility] for his diabetes. He had some difficulty getting his Trulicity due to supply problems, but he is on all his medications now. His A1c today is 8.2% (down from 9.1% on 11-18-21). He is seeing Dr. Percival Spanish and the [facility] for his HTN, and this seems to be well controlled. He says his readings at home are mostly in the 130's over 80's. He notes that he has not taken his meds yet this morning. He is seeing Dr. Doran Durand regularly, and they are keeping an eye on a large callus over the great metatarsal head on the right foot. This was trimmed down yesterday. He also has been dealing with a pain over the left lateral ankle. This started about a month ago. He has ben taking Tramadol as needed. He knows he cannot take oral NSAID's. Finally he has been dealing with a lot of stress the past few months, mostly over family issues. He worries about things and this affects his sleep. He knows this makes his BP go up.    Review of Systems  Constitutional: Negative.   Respiratory: Negative.    Cardiovascular: Negative.   Musculoskeletal:  Positive for arthralgias.  Psychiatric/Behavioral:  Positive for dysphoric mood and sleep disturbance. Negative for agitation, behavioral problems, confusion, decreased concentration, hallucinations, self-injury and suicidal ideas. The patient is nervous/anxious.        Objective:   Physical Exam Constitutional:      General: He is not in acute distress.    Appearance: Normal appearance.  Cardiovascular:     Rate and Rhythm: Normal rate and regular rhythm.     Pulses: Normal pulses.     Heart sounds: Normal heart sounds.  Pulmonary:     Effort: Pulmonary effort is normal.     Breath sounds: Normal breath sounds.  Skin:    Comments: There is a large hard non-tender callus over the  right great metatarsal head. No redness or warmth or drainage   Neurological:     Mental Status: He is alert.           Assessment & Plan:  His HTN and diabetes are fairly well managed. He will follow up with Dr. Doran Durand for the callus. He seems to have a tendonitis in the left ankle, and I suggested he try Voltaren gel for this. Finally he will try Zoloft 50 mg daily to help him deal with the stress in his life. He will report back to [name] in 3-4 weeks.  [name], MD

## 2022-05-24 ENCOUNTER — Ambulatory Visit: Payer: 59 | Admitting: Cardiology

## 2022-05-24 DIAGNOSIS — Z794 Long term (current) use of insulin: Secondary | ICD-10-CM | POA: Diagnosis not present

## 2022-05-24 DIAGNOSIS — Z9641 Presence of insulin pump (external) (internal): Secondary | ICD-10-CM | POA: Diagnosis not present

## 2022-05-24 DIAGNOSIS — E1165 Type 2 diabetes mellitus with hyperglycemia: Secondary | ICD-10-CM | POA: Diagnosis not present

## 2022-05-24 DIAGNOSIS — Z7985 Long-term (current) use of injectable non-insulin antidiabetic drugs: Secondary | ICD-10-CM | POA: Diagnosis not present

## 2022-05-25 DIAGNOSIS — E13621 Other specified diabetes mellitus with foot ulcer: Secondary | ICD-10-CM | POA: Diagnosis not present

## 2022-05-25 DIAGNOSIS — Z89439 Acquired absence of unspecified foot: Secondary | ICD-10-CM | POA: Diagnosis not present

## 2022-05-25 DIAGNOSIS — L84 Corns and callosities: Secondary | ICD-10-CM | POA: Diagnosis not present

## 2022-06-05 DIAGNOSIS — G4733 Obstructive sleep apnea (adult) (pediatric): Secondary | ICD-10-CM | POA: Diagnosis not present

## 2022-06-07 DIAGNOSIS — E1165 Type 2 diabetes mellitus with hyperglycemia: Secondary | ICD-10-CM | POA: Diagnosis not present

## 2022-06-09 DIAGNOSIS — E13621 Other specified diabetes mellitus with foot ulcer: Secondary | ICD-10-CM | POA: Diagnosis not present

## 2022-06-09 DIAGNOSIS — L84 Corns and callosities: Secondary | ICD-10-CM | POA: Diagnosis not present

## 2022-06-09 DIAGNOSIS — Z89439 Acquired absence of unspecified foot: Secondary | ICD-10-CM | POA: Diagnosis not present

## 2022-06-19 DIAGNOSIS — E13621 Other specified diabetes mellitus with foot ulcer: Secondary | ICD-10-CM | POA: Diagnosis not present

## 2022-06-19 DIAGNOSIS — Z89439 Acquired absence of unspecified foot: Secondary | ICD-10-CM | POA: Diagnosis not present

## 2022-06-19 DIAGNOSIS — L84 Corns and callosities: Secondary | ICD-10-CM | POA: Diagnosis not present

## 2022-06-22 ENCOUNTER — Other Ambulatory Visit (HOSPITAL_COMMUNITY): Payer: Self-pay | Admitting: *Deleted

## 2022-06-22 ENCOUNTER — Other Ambulatory Visit: Payer: Self-pay

## 2022-06-22 DIAGNOSIS — E1140 Type 2 diabetes mellitus with diabetic neuropathy, unspecified: Secondary | ICD-10-CM

## 2022-06-22 DIAGNOSIS — I10 Essential (primary) hypertension: Secondary | ICD-10-CM

## 2022-06-22 MED ORDER — AMLODIPINE-OLMESARTAN 10-40 MG PO TABS: 1.0000 | ORAL_TABLET | Freq: Every day | ORAL | 1 refills | Status: DC

## 2022-06-22 MED ORDER — APIXABAN 5 MG PO TABS: 5.0000 mg | ORAL_TABLET | Freq: Two times a day (BID) | ORAL | 1 refills | Status: DC

## 2022-06-23 ENCOUNTER — Ambulatory Visit (INDEPENDENT_AMBULATORY_CARE_PROVIDER_SITE_OTHER): Payer: 59 | Admitting: Family Medicine

## 2022-06-23 ENCOUNTER — Encounter: Payer: Self-pay | Admitting: Family Medicine

## 2022-06-23 VITALS — BP 120/80 | HR 86 | Temp 97.5°F | Wt 257.0 lb

## 2022-06-23 DIAGNOSIS — Z89432 Acquired absence of left foot: Secondary | ICD-10-CM | POA: Diagnosis not present

## 2022-06-23 DIAGNOSIS — I10 Essential (primary) hypertension: Secondary | ICD-10-CM

## 2022-06-23 DIAGNOSIS — Z794 Long term (current) use of insulin: Secondary | ICD-10-CM | POA: Diagnosis not present

## 2022-06-23 DIAGNOSIS — E1140 Type 2 diabetes mellitus with diabetic neuropathy, unspecified: Secondary | ICD-10-CM

## 2022-06-23 NOTE — Progress Notes (Signed)
   Subjective:    Patient ID: Danny Fuller, male    DOB: 1975/06/04, 47 y.o.   MRN: [id number]  HPI Here for several issues. First he is seeing Alfredo Martinez PA for podiatric care, and he is being fitted with orthopedic shoes. He needs a diabetic foot exam for this. Also he thinks his BP has been dropping low. At times he feels weak and lightheaded, then if he drinks some coffee he feels better. His glucoses have been stable.    Review of Systems  Constitutional: Negative.   Respiratory: Negative.    Cardiovascular: Negative.   Neurological:  Positive for light-headedness.       Objective:   Physical Exam Constitutional:      Appearance: Normal appearance.  Cardiovascular:     Rate and Rhythm: Normal rate and regular rhythm.  Pulmonary:     Effort: Pulmonary effort is normal.     Breath sounds: Normal breath sounds.  Musculoskeletal:     Comments: The right foot appears normal. DP and PT pulses are full. Skin is warm and pink. The left foot is S/P a partial amputation. DP and PT pulses are absent. Skin is warm and pink. Both feet have decreased sensation to light touch below the ankles. There is a shallow pressure ulcer over the 1st metatarsal head on the right foot, and there is a larger ulcer over the left heel   Neurological:     General: No focal deficit present.     Mental Status: He is alert and oriented to person, place, and time.           Assessment & Plan:  We did the diabetic foot exam on both feet to support the use of orthopedic shoes. The other issue is apparent spells of low BP. We will decrease the Hydralazine from TID to BID. He will purchase a home BP cuff so he can track the BP closely.  Gershon Crane, MD

## 2022-06-26 ENCOUNTER — Encounter: Payer: Self-pay | Admitting: Family Medicine

## 2022-06-26 DIAGNOSIS — E11621 Type 2 diabetes mellitus with foot ulcer: Secondary | ICD-10-CM | POA: Diagnosis not present

## 2022-06-26 DIAGNOSIS — L97512 Non-pressure chronic ulcer of other part of right foot with fat layer exposed: Secondary | ICD-10-CM | POA: Diagnosis not present

## 2022-06-26 DIAGNOSIS — E1142 Type 2 diabetes mellitus with diabetic polyneuropathy: Secondary | ICD-10-CM | POA: Diagnosis not present

## 2022-06-26 DIAGNOSIS — Z794 Long term (current) use of insulin: Secondary | ICD-10-CM | POA: Diagnosis not present

## 2022-06-26 DIAGNOSIS — Z89432 Acquired absence of left foot: Secondary | ICD-10-CM | POA: Diagnosis not present

## 2022-06-27 ENCOUNTER — Other Ambulatory Visit: Payer: Self-pay

## 2022-06-27 DIAGNOSIS — I10 Essential (primary) hypertension: Secondary | ICD-10-CM

## 2022-06-27 MED ORDER — HYDRALAZINE HCL 25 MG PO TABS: 25.0000 mg | ORAL_TABLET | Freq: Three times a day (TID) | ORAL | 2 refills | Status: DC

## 2022-06-28 ENCOUNTER — Other Ambulatory Visit: Payer: Self-pay

## 2022-06-28 DIAGNOSIS — Z794 Long term (current) use of insulin: Secondary | ICD-10-CM

## 2022-06-28 DIAGNOSIS — H04123 Dry eye syndrome of bilateral lacrimal glands: Secondary | ICD-10-CM | POA: Diagnosis not present

## 2022-06-28 DIAGNOSIS — L97512 Non-pressure chronic ulcer of other part of right foot with fat layer exposed: Secondary | ICD-10-CM | POA: Diagnosis not present

## 2022-06-28 DIAGNOSIS — H40013 Open angle with borderline findings, low risk, bilateral: Secondary | ICD-10-CM | POA: Diagnosis not present

## 2022-06-28 MED ORDER — CLONIDINE 0.3 MG/24HR TD PTWK: 0.3000 mg | MEDICATED_PATCH | TRANSDERMAL | 11 refills | Status: DC

## 2022-06-28 MED ORDER — CYCLOBENZAPRINE HCL 10 MG PO TABS: 10.0000 mg | ORAL_TABLET | Freq: Three times a day (TID) | ORAL | 0 refills | Status: DC | PRN

## 2022-06-28 MED ORDER — TRULICITY 1.5 MG/0.5ML ~~LOC~~ SOAJ: SUBCUTANEOUS | 2 refills | Status: DC

## 2022-06-28 MED ORDER — PRAVASTATIN SODIUM 40 MG PO TABS: 40.0000 mg | ORAL_TABLET | Freq: Every evening | ORAL | 1 refills | Status: DC

## 2022-06-28 MED ORDER — SERTRALINE HCL 50 MG PO TABS: 50.0000 mg | ORAL_TABLET | Freq: Every day | ORAL | 0 refills | Status: DC

## 2022-07-03 DIAGNOSIS — L97512 Non-pressure chronic ulcer of other part of right foot with fat layer exposed: Secondary | ICD-10-CM | POA: Diagnosis not present

## 2022-07-03 DIAGNOSIS — L84 Corns and callosities: Secondary | ICD-10-CM | POA: Diagnosis not present

## 2022-07-03 DIAGNOSIS — Z89432 Acquired absence of left foot: Secondary | ICD-10-CM | POA: Diagnosis not present

## 2022-07-03 DIAGNOSIS — E13621 Other specified diabetes mellitus with foot ulcer: Secondary | ICD-10-CM | POA: Diagnosis not present

## 2022-07-03 DIAGNOSIS — E11621 Type 2 diabetes mellitus with foot ulcer: Secondary | ICD-10-CM | POA: Diagnosis not present

## 2022-07-03 DIAGNOSIS — Z89439 Acquired absence of unspecified foot: Secondary | ICD-10-CM | POA: Diagnosis not present

## 2022-07-03 DIAGNOSIS — E1142 Type 2 diabetes mellitus with diabetic polyneuropathy: Secondary | ICD-10-CM | POA: Diagnosis not present

## 2022-07-03 DIAGNOSIS — Z794 Long term (current) use of insulin: Secondary | ICD-10-CM | POA: Diagnosis not present

## 2022-07-06 ENCOUNTER — Other Ambulatory Visit: Payer: Self-pay

## 2022-07-13 ENCOUNTER — Telehealth: Payer: Self-pay | Admitting: Family Medicine

## 2022-07-13 NOTE — Telephone Encounter (Signed)
Prescription Request  07/13/2022  LOV: 06/23/2022  What is the name of the medication or equipment?  traMADol (ULTRAM) 50 MG tablet   Have you contacted your pharmacy to request a refill? Yes   Erica at [facility] called to request this refill.  Which pharmacy would you like this sent to?   [facility]) [place], [place] - [address] [place] Phone: [phone]  Fax: [phone]      Patient notified that their request is being sent to the clinical staff for review and that they should receive a response within 2 business days.   Please advise at  Mobile [phone] (mobile)

## 2022-07-14 NOTE — Telephone Encounter (Signed)
Pt last refill was sent to [facility] on 05/18/22 Pt request Rx to go to [facility] Please advise

## 2022-07-15 DIAGNOSIS — G4733 Obstructive sleep apnea (adult) (pediatric): Secondary | ICD-10-CM | POA: Diagnosis not present

## 2022-07-18 ENCOUNTER — Other Ambulatory Visit: Payer: Self-pay

## 2022-07-18 MED ORDER — TRAMADOL HCL 50 MG PO TABS: 100.0000 mg | ORAL_TABLET | Freq: Four times a day (QID) | ORAL | 1 refills | Status: DC | PRN

## 2022-07-18 NOTE — Telephone Encounter (Signed)
Done

## 2022-07-18 NOTE — Telephone Encounter (Signed)
Pt notified via My Chart

## 2022-07-19 MED ORDER — TRAMADOL HCL 50 MG PO TABS: 100.0000 mg | ORAL_TABLET | Freq: Four times a day (QID) | ORAL | 1 refills | Status: DC | PRN

## 2022-07-19 NOTE — Telephone Encounter (Signed)
Done. Please cancel the one at [facility]

## 2022-07-26 DIAGNOSIS — Z89439 Acquired absence of unspecified foot: Secondary | ICD-10-CM | POA: Diagnosis not present

## 2022-07-26 DIAGNOSIS — E13621 Other specified diabetes mellitus with foot ulcer: Secondary | ICD-10-CM | POA: Diagnosis not present

## 2022-07-26 DIAGNOSIS — L84 Corns and callosities: Secondary | ICD-10-CM | POA: Diagnosis not present

## 2022-07-27 ENCOUNTER — Ambulatory Visit (HOSPITAL_COMMUNITY): Payer: 59 | Admitting: Physician Assistant

## 2022-07-27 ENCOUNTER — Encounter (HOSPITAL_COMMUNITY): Payer: Self-pay

## 2022-08-09 DIAGNOSIS — L84 Corns and callosities: Secondary | ICD-10-CM | POA: Diagnosis not present

## 2022-08-09 DIAGNOSIS — E13621 Other specified diabetes mellitus with foot ulcer: Secondary | ICD-10-CM | POA: Diagnosis not present

## 2022-08-17 ENCOUNTER — Other Ambulatory Visit (HOSPITAL_COMMUNITY): Payer: Self-pay | Admitting: Physician Assistant

## 2022-08-28 ENCOUNTER — Other Ambulatory Visit: Payer: Self-pay | Admitting: Cardiology

## 2022-08-28 DIAGNOSIS — E1140 Type 2 diabetes mellitus with diabetic neuropathy, unspecified: Secondary | ICD-10-CM

## 2022-08-28 DIAGNOSIS — I10 Essential (primary) hypertension: Secondary | ICD-10-CM

## 2022-08-30 DIAGNOSIS — E13621 Other specified diabetes mellitus with foot ulcer: Secondary | ICD-10-CM | POA: Diagnosis not present

## 2022-08-30 DIAGNOSIS — L84 Corns and callosities: Secondary | ICD-10-CM | POA: Diagnosis not present

## 2022-09-04 DIAGNOSIS — G4733 Obstructive sleep apnea (adult) (pediatric): Secondary | ICD-10-CM | POA: Diagnosis not present

## 2022-09-05 ENCOUNTER — Other Ambulatory Visit: Payer: Self-pay | Admitting: Family Medicine

## 2022-09-08 DIAGNOSIS — E1122 Type 2 diabetes mellitus with diabetic chronic kidney disease: Secondary | ICD-10-CM | POA: Diagnosis not present

## 2022-09-08 DIAGNOSIS — R809 Proteinuria, unspecified: Secondary | ICD-10-CM | POA: Diagnosis not present

## 2022-09-08 DIAGNOSIS — N2581 Secondary hyperparathyroidism of renal origin: Secondary | ICD-10-CM | POA: Diagnosis not present

## 2022-09-08 DIAGNOSIS — D631 Anemia in chronic kidney disease: Secondary | ICD-10-CM | POA: Diagnosis not present

## 2022-09-08 DIAGNOSIS — I129 Hypertensive chronic kidney disease with stage 1 through stage 4 chronic kidney disease, or unspecified chronic kidney disease: Secondary | ICD-10-CM | POA: Diagnosis not present

## 2022-09-08 DIAGNOSIS — N182 Chronic kidney disease, stage 2 (mild): Secondary | ICD-10-CM | POA: Diagnosis not present

## 2022-09-09 LAB — LAB REPORT - SCANNED
Albumin, Urine POC: 552.7
Albumin/Creatinine Ratio, Urine, POC: 320
Creatinine, POC: 172.7 mg/dL
EGFR: 85

## 2022-09-11 ENCOUNTER — Ambulatory Visit: Payer: 59 | Attending: Cardiology | Admitting: Cardiology

## 2022-09-11 ENCOUNTER — Encounter: Payer: Self-pay | Admitting: Cardiology

## 2022-09-16 ENCOUNTER — Other Ambulatory Visit: Payer: Self-pay | Admitting: Family Medicine

## 2022-09-20 DIAGNOSIS — L84 Corns and callosities: Secondary | ICD-10-CM | POA: Diagnosis not present

## 2022-09-20 DIAGNOSIS — E13621 Other specified diabetes mellitus with foot ulcer: Secondary | ICD-10-CM | POA: Diagnosis not present

## 2022-09-26 ENCOUNTER — Other Ambulatory Visit: Payer: Self-pay | Admitting: Cardiology

## 2022-09-26 DIAGNOSIS — I10 Essential (primary) hypertension: Secondary | ICD-10-CM

## 2022-09-26 DIAGNOSIS — E1140 Type 2 diabetes mellitus with diabetic neuropathy, unspecified: Secondary | ICD-10-CM

## 2022-10-09 DIAGNOSIS — E1165 Type 2 diabetes mellitus with hyperglycemia: Secondary | ICD-10-CM | POA: Diagnosis not present

## 2022-10-10 DIAGNOSIS — Z794 Long term (current) use of insulin: Secondary | ICD-10-CM | POA: Diagnosis not present

## 2022-10-10 DIAGNOSIS — E1165 Type 2 diabetes mellitus with hyperglycemia: Secondary | ICD-10-CM | POA: Diagnosis not present

## 2022-10-11 DIAGNOSIS — E1142 Type 2 diabetes mellitus with diabetic polyneuropathy: Secondary | ICD-10-CM | POA: Diagnosis not present

## 2022-10-11 DIAGNOSIS — Z89422 Acquired absence of other left toe(s): Secondary | ICD-10-CM | POA: Diagnosis not present

## 2022-10-23 DIAGNOSIS — E13621 Other specified diabetes mellitus with foot ulcer: Secondary | ICD-10-CM | POA: Diagnosis not present

## 2022-10-23 DIAGNOSIS — L84 Corns and callosities: Secondary | ICD-10-CM | POA: Diagnosis not present

## 2022-11-04 ENCOUNTER — Other Ambulatory Visit (HOSPITAL_COMMUNITY): Payer: Self-pay | Admitting: Physician Assistant

## 2022-11-15 DIAGNOSIS — L84 Corns and callosities: Secondary | ICD-10-CM | POA: Diagnosis not present

## 2022-11-15 DIAGNOSIS — E13621 Other specified diabetes mellitus with foot ulcer: Secondary | ICD-10-CM | POA: Diagnosis not present

## 2022-11-15 DIAGNOSIS — M25472 Effusion, left ankle: Secondary | ICD-10-CM | POA: Diagnosis not present

## 2022-11-29 DIAGNOSIS — M25572 Pain in left ankle and joints of left foot: Secondary | ICD-10-CM | POA: Diagnosis not present

## 2022-11-29 DIAGNOSIS — M25472 Effusion, left ankle: Secondary | ICD-10-CM | POA: Diagnosis not present

## 2022-11-29 DIAGNOSIS — L02612 Cutaneous abscess of left foot: Secondary | ICD-10-CM | POA: Diagnosis not present

## 2022-11-29 DIAGNOSIS — E13621 Other specified diabetes mellitus with foot ulcer: Secondary | ICD-10-CM | POA: Diagnosis not present

## 2022-11-29 DIAGNOSIS — L84 Corns and callosities: Secondary | ICD-10-CM | POA: Diagnosis not present

## 2022-12-06 DIAGNOSIS — L02612 Cutaneous abscess of left foot: Secondary | ICD-10-CM | POA: Diagnosis not present

## 2022-12-20 DIAGNOSIS — E13621 Other specified diabetes mellitus with foot ulcer: Secondary | ICD-10-CM | POA: Diagnosis not present

## 2022-12-29 DIAGNOSIS — E13621 Other specified diabetes mellitus with foot ulcer: Secondary | ICD-10-CM | POA: Diagnosis not present

## 2023-01-05 ENCOUNTER — Encounter: Payer: Self-pay | Admitting: Podiatry

## 2023-01-05 ENCOUNTER — Ambulatory Visit (INDEPENDENT_AMBULATORY_CARE_PROVIDER_SITE_OTHER): Payer: 59 | Admitting: Podiatry

## 2023-01-05 DIAGNOSIS — E1140 Type 2 diabetes mellitus with diabetic neuropathy, unspecified: Secondary | ICD-10-CM | POA: Diagnosis not present

## 2023-01-05 DIAGNOSIS — L97511 Non-pressure chronic ulcer of other part of right foot limited to breakdown of skin: Secondary | ICD-10-CM | POA: Diagnosis not present

## 2023-01-05 DIAGNOSIS — L84 Corns and callosities: Secondary | ICD-10-CM | POA: Diagnosis not present

## 2023-01-05 DIAGNOSIS — Z89432 Acquired absence of left foot: Secondary | ICD-10-CM

## 2023-01-05 DIAGNOSIS — Z794 Long term (current) use of insulin: Secondary | ICD-10-CM

## 2023-01-05 NOTE — Progress Notes (Unsigned)
Subjective:   Patient ID: Danny Fuller, male   DOB: 47 y.o.   MRN: [id number]   HPI Chief Complaint  Patient presents with   Foot Ulcer    RM#11 Right foot and Left heel area ulcer needs shaving/nail trim.   47 year old male presents the office today for concerns of ulcers, calluses bilaterally.  He previously underwent a transmetatarsal amputation with Dr. Victorino Dike on November 20, 2019.  He has not suffered from calluses, ulcerations of both feet.  He does not report any drainage or pus.  No fevers or chills.   Review of Systems  All other systems reviewed and are negative.  Past Medical History:  Diagnosis Date   Asthma    Back pain    Bulging Disk   Chronic kidney disease    Diabetes mellitus    sees Fredia Sorrow NP at [facility]   Diabetic foot ulcers [facility])    sees Dr. Toni Arthurs    GERD (gastroesophageal reflux disease)    Glaucoma    Headache(784.0)    Hyperlipidemia    Hypertension    sees Dr. Antoine Poche    Sleep apnea    CPAP    Past Surgical History:  Procedure Laterality Date   AMPUTATION Left 07/26/2015   Procedure: AMPUTATION LEFT FIFTH RAY;  Surgeon: Nadara Mustard, MD;  Location: [facility] OR;  Service: Orthopedics;  Laterality: Left;   bilateral hip pins placed     INCISION AND DRAINAGE PERIRECTAL ABSCESS Right 03/07/2016   Procedure: IRRIGATION AND DEBRIDEMENT PERIRECTAL ABSCESS;  Surgeon: Ovidio Kin, MD;  Location: [facility] ORS;  Service: General;  Laterality: Right;   INCISION AND DRAINAGE PERIRECTAL ABSCESS Right 03/09/2016   Procedure: EXAM UNDER ANESTHESIA, IRRIGATION AND DEBRIDEMENT PERIRECTAL ABSCESS;  Surgeon: Luretha Murphy, MD;  Location: [facility] ORS;  Service: General;  Laterality: Right;  Open, betadine packed wound   TOE AMPUTATION Left 11/19/2019   Removal of all toes from left foot   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/20/2019   Procedure: TRANSMETATARSAL AMPUTATION LEFT FOOT;  Surgeon: Toni Arthurs, MD;  Location: [facility] OR;  Service: Orthopedics;   Laterality: Left;     Current Outpatient Medications:    amLODipine-olmesartan (AZOR) 10-40 MG tablet, TAKE 1 TABLET BY MOUTH DAILY, Disp: 100 tablet, Rfl: 2   cloNIDine (CATAPRES - DOSED IN MG/24 HR) 0.3 mg/24hr patch, Place 1 patch (0.3 mg total) onto the skin once a week., Disp: 4 patch, Rfl: 11   cyclobenzaprine (FLEXERIL) 10 MG tablet, Take 1 tablet (10 mg total) by mouth 3 (three) times daily as needed for muscle spasms., Disp: 90 tablet, Rfl: 0   Dulaglutide (TRULICITY) 1.5 MG/0.5ML SOPN, Trulicity 1.5 mg/0.5 mL subcutaneous pen injector, Disp: 2 mL, Rfl: 2   ELIQUIS 5 MG TABS tablet, TAKE 1 TABLET BY MOUTH TWICE  DAILY, Disp: 200 tablet, Rfl: 2   furosemide (LASIX) 20 MG tablet, 20 mg by oral route., Disp: , Rfl:    glucose blood test strip, 1 each by Other route 4 (four) times daily. , Disp: , Rfl:    HUMALOG 100 UNIT/ML injection, Inject into the skin as directed., Disp: , Rfl:    hydrALAZINE (APRESOLINE) 25 MG tablet, Take 1 tablet (25 mg total) by mouth 3 (three) times daily., Disp: 180 tablet, Rfl: 2   HYDROcodone-acetaminophen (NORCO) 10-325 MG tablet, TAKE 1 TABLET BY MOUTH EVERY 4 TO 6 HOURS FOR 5 DAYS AS NEEDED, Disp: , Rfl:    insulin aspart (NOVOLOG) 100 UNIT/ML injection, USE MAX  OF 0.6 MLS PER DAY IN INSULIN PUMP, Disp: , Rfl:    Insulin Disposable Pump (OMNIPOD DASH PODS, GEN 4,) MISC, INJECT 1 EACH INTO THE SKIN CONTINUOUS. APPLY 1 OMNIPOD INSULIN PUMP TO BODY DAILY FOR INSULIN, Disp: 30 each, Rfl: 1   Insulin Pen Needle 31G X 5 MM MISC, 1 each by Other route daily. , Disp: , Rfl:    labetalol (NORMODYNE) 100 MG tablet, 100 mg., Disp: , Rfl:    LYRICA 150 MG capsule, Take 1 capsule (150 mg total) by mouth 3 (three) times daily., Disp: 90 capsule, Rfl: 5   nortriptyline (PAMELOR) 10 MG capsule, TAKE 4 CAPSULES BY MOUTH AT BEDTIME, Disp: 360 capsule, Rfl: 3   pravastatin (PRAVACHOL) 40 MG tablet, TAKE 1 TABLET BY MOUTH EVERY  EVENING, Disp: 100 tablet, Rfl: 0   sertraline  (ZOLOFT) 50 MG tablet, TAKE 1 TABLET BY MOUTH DAILY, Disp: 100 tablet, Rfl: 1   traMADol (ULTRAM) 50 MG tablet, Take 2 tablets (100 mg total) by mouth every 6 (six) hours as needed for moderate pain., Disp: 360 tablet, Rfl: 1  Allergies  Allergen Reactions   Augmentin [Amoxicillin-Pot Clavulanate] Itching and Other (See Comments)    Dizziness   Vancomycin Other (See Comments)    AKI          Objective:  Physical Exam  General: AAO x3, NAD  Dermatological: Thick hyperkeratotic lesion noted right foot submetatarsal 1 with small superficial granular wound present without any probing to [place] or tunneling.  There is also a callus noted submetatarsal 5 on the right foot as well as the plantar aspect of the left foot.  There are some dried blood on the area of the left foot callus but there is no open lesion identified at this time otherwise.  There is no fluctuation or crepitation.  There is no malodor.  The nails on the right foot are hypertrophic, dystrophic with brown discoloration and several debris is present.  Vascular: Dorsalis Pedis artery and Posterior Tibial artery pedal pulses are palpable bilateral with immedate capillary fill time. Pedal hair growth present. No varicosities and no lower extremity edema present bilateral. There is no pain with calf compression, swelling, warmth, erythema.   Neruologic: Sensation decreased  Musculoskeletal: Left transmetatarsal amputation.      Assessment:   Superficial ulceration right foot, preulcerative calluses    Plan:  -Treatment options discussed including all alternatives, risks, and complications -Etiology of symptoms were discussed -Test of the debrided hyperkeratotic lesions bilaterally.  Superficial ulceration noted along the right foot submetatarsal 1.  Minimal bleeding occurred.  Clean the area with saline.  Small amount of antibiotic ointment and a bandage.  Discussed with him daily dressing changes, offloading. -I do think  benefit from custom orthotics to help offload as well as a toe filler. I will have him scheduled to see Nicki Guadalajara, pedorthist for this. -Monitor for any clinical signs or symptoms of infection and directed to call the office immediately should any occur or go to the ER.  Return in about 2 weeks (around 01/19/2023).  Vivi Barrack DPM

## 2023-01-19 ENCOUNTER — Encounter: Payer: Self-pay | Admitting: Podiatry

## 2023-01-19 ENCOUNTER — Ambulatory Visit (INDEPENDENT_AMBULATORY_CARE_PROVIDER_SITE_OTHER): Payer: 59 | Admitting: Podiatry

## 2023-01-19 DIAGNOSIS — Z794 Long term (current) use of insulin: Secondary | ICD-10-CM | POA: Diagnosis not present

## 2023-01-19 DIAGNOSIS — E1140 Type 2 diabetes mellitus with diabetic neuropathy, unspecified: Secondary | ICD-10-CM | POA: Diagnosis not present

## 2023-01-19 DIAGNOSIS — L97511 Non-pressure chronic ulcer of other part of right foot limited to breakdown of skin: Secondary | ICD-10-CM | POA: Diagnosis not present

## 2023-01-19 DIAGNOSIS — L84 Corns and callosities: Secondary | ICD-10-CM | POA: Diagnosis not present

## 2023-01-22 NOTE — Progress Notes (Signed)
Subjective:   Patient ID: Danny Fuller, male   DOB: 47 y.o.   MRN: [id number]   HPI Chief Complaint  Patient presents with   Callouses    Patient states he is here to get his callouses cut , last swelling patient states , " whatever he did last time helped"    47 year old male presents the office today for Evaluation of ulceration of the calluses bilaterally.  States that he is doing better and it did help after this appointment.  Denies any open lesions or any drainage or pus.  Denies any.  Chills.  No other concerns today.     He previously underwent a transmetatarsal amputation with Dr. Victorino Dike on November 20, 2019.  He has suffered from calluses, ulcerations of both feet.   Review of Systems  All other systems reviewed and are negative.      Objective:  Physical Exam  General: AAO x3, NAD  Dermatological: Thick hyperkeratotic lesion noted right foot submetatarsal 1 without any definitive skin breakdown identified to this area today.  Area is preulcerative.  Present lesion present also on the plantar aspect left foot.  There is an area of scabbing, dried blood centrally.  There is no definitive skin breakdown identified today.  with small superficial granular wound present without any probing to [place] or tunneling.  There is also a callus noted submetatarsal 5 on the right foot as well as the plantar aspect of the left foot.  There are some dried blood on the area of the left foot callus but there is no open lesion identified at this time otherwise.  There is no fluctuation or crepitation.  There is no malodor.  The nails on the right foot are hypertrophic, dystrophic with brown discoloration and several debris is present.  Vascular: Dorsalis Pedis artery and Posterior Tibial artery pedal pulses are palpable bilateral with immedate capillary fill time. Pedal hair growth present. No varicosities and no lower extremity edema present bilateral. There is no pain with calf compression,  swelling, warmth, erythema.   Neruologic: Sensation decreased  Musculoskeletal: Left transmetatarsal amputation.      Assessment:   Superficial ulceration right foot, preulcerative calluses    Plan:  -Treatment options discussed including all alternatives, risks, and complications -Etiology of symptoms were discussed -Sharply debrided hyperkeratotic lesions bilaterally.  No ulceration on the right foot today.  There is no bleeding today during debridement on the right foot Small no bleeding occurred on the left foot today.  Antibiotic ointment is applied followed by dressing.  Discussed continue moisturizer, offloading and wearing an older style croc.  Discussed different types of shoes to help decrease pressure.  Discussed inserts to help offload.  -Monitor for any clinical signs or symptoms of infection and directed to call the office immediately should any occur or go to the ER.  Return in about 3 weeks (around 02/09/2023) for pre-ulcerative callus.  Vivi Barrack DPM

## 2023-02-09 ENCOUNTER — Encounter: Payer: Self-pay | Admitting: Podiatry

## 2023-02-09 ENCOUNTER — Ambulatory Visit (INDEPENDENT_AMBULATORY_CARE_PROVIDER_SITE_OTHER): Payer: 59 | Admitting: Podiatry

## 2023-02-09 DIAGNOSIS — L84 Corns and callosities: Secondary | ICD-10-CM | POA: Diagnosis not present

## 2023-02-12 NOTE — Progress Notes (Signed)
Subjective:   Patient ID: Danny Fuller, male   DOB: 47 y.o.   MRN: [id number]   HPI Chief Complaint  Patient presents with   Callouses    RM#13 Follow up on right foot  and left heel calluses.     47 year old male presents the office today for follow-up evaluation of ulceration of the calluses bilaterally.  States that he is doing better and it did help after this appointment.  He said overall he is doing better.  Does not see any drainage or any bleeding.  No swelling or redness.  No fevers or chills.   He previously underwent a transmetatarsal amputation with Dr. Victorino Dike on November 20, 2019.  He has suffered from calluses, ulcerations of both feet.   Review of Systems  All other systems reviewed and are negative.      Objective:  Physical Exam  General: AAO x3, NAD  Dermatological: Thick hyperkeratotic lesion noted right foot submetatarsal 1 without any definitive skin breakdown identified to this area today.  Area is preulcerative, although clinically appears to be improved today.  Present lesion present also on the plantar aspect left foot.  There is an area of scabbing, dried blood centrally, however again appears to be improved.  There is no definitive skin breakdown identified today.  Vascular: Dorsalis Pedis artery and Posterior Tibial artery pedal pulses are palpable bilateral with immedate capillary fill time. Pedal hair growth present. No varicosities and no lower extremity edema present bilateral. There is no pain with calf compression, swelling, warmth, erythema.   Neruologic: Sensation decreased  Musculoskeletal: Left transmetatarsal amputation.  Prominent submetatarsal 1 right foot.      Assessment:   Bilateral preulcerative calluses    Plan:  -Treatment options discussed including all alternatives, risks, and complications -Etiology of symptoms were discussed -Sharply debrided hyperkeratotic lesions bilaterally.  Ulcerations appear to have healed however they  are still preulcerative he is monitor very closely.  Continue offloading.  He did get new pair of shoes but I do think you will also benefit from a custom accommodative orthotic.  He previous had inserts made by [employer] but he states they were hard. I am going to have him follow up with Nicki Guadalajara, pedorthist for evaluation of new inserts.  Return in about 3 weeks (around 03/02/2023).  Vivi Barrack DPM

## 2023-03-01 ENCOUNTER — Encounter: Payer: Self-pay | Admitting: Podiatry

## 2023-03-01 ENCOUNTER — Ambulatory Visit (INDEPENDENT_AMBULATORY_CARE_PROVIDER_SITE_OTHER): Payer: 59 | Admitting: Podiatry

## 2023-03-01 DIAGNOSIS — L84 Corns and callosities: Secondary | ICD-10-CM | POA: Diagnosis not present

## 2023-03-01 DIAGNOSIS — Z794 Long term (current) use of insulin: Secondary | ICD-10-CM

## 2023-03-01 DIAGNOSIS — E1140 Type 2 diabetes mellitus with diabetic neuropathy, unspecified: Secondary | ICD-10-CM | POA: Diagnosis not present

## 2023-03-06 ENCOUNTER — Other Ambulatory Visit: Payer: Self-pay | Admitting: Family Medicine

## 2023-03-06 ENCOUNTER — Other Ambulatory Visit: Payer: 59

## 2023-03-06 NOTE — Progress Notes (Signed)
Subjective:   Patient ID: Danny Fuller, male   DOB: 48 y.o.   MRN: [id number]   HPI Chief Complaint  Patient presents with   Foot Ulcer    RM#11 Right foot ulcer patient states doing well just minor soreness.     48 year old male presents the office today for follow-up evaluation of ulceration of the calluses bilaterally.  This is some occasional discomfort but overall he has been feeling well.  No open lesions or any drainage or any bleeding.  He has no new concerns.  No fevers or chills.  Review of Systems  All other systems reviewed and are negative.      Objective:  Physical Exam  General: AAO x3, NAD  Dermatological: Thick hyperkeratotic lesion noted right foot submetatarsal 1 without any definitive skin breakdown identified to this area today.  Hyperkeratotic lesion also noted to the left heel.  Overall but the lesions appear to be doing better.  There is no skin breakdown.  There is no significant scab, dried blood today but the calluses are still thick.  There is no open lesions identified.  There is no areas of fluctuation or crepitation.  Vascular: Dorsalis Pedis artery and Posterior Tibial artery pedal pulses are palpable bilateral with immedate capillary fill time. Pedal hair growth present. No varicosities and no lower extremity edema present bilateral. There is no pain with calf compression, swelling, warmth, erythema.   Neruologic: Sensation decreased  Musculoskeletal: Left transmetatarsal amputation.  Prominent submetatarsal 1 right foot.      Assessment:   Bilateral preulcerative calluses    Plan:  -Treatment options discussed including all alternatives, risks, and complications -Etiology of symptoms were discussed -Today, I sharply debrided the preulcerative hyperkeratotic lesions bilateral without any complications or bleeding.  Continue moisturizer.  I do think a benefit from diabetic shoes, accommodative orthotics. He is scheduled to get measured for  these.  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 -Daily foot inspection.   Return in about 3 weeks (around 03/22/2023) for pre-ulcerative callus.  Vivi Barrack DPM

## 2023-03-08 ENCOUNTER — Ambulatory Visit: Payer: 59

## 2023-03-22 ENCOUNTER — Ambulatory Visit: Payer: 59 | Admitting: Podiatry

## 2023-03-29 NOTE — Progress Notes (Signed)
NA

## 2023-04-01 ENCOUNTER — Other Ambulatory Visit: Payer: Self-pay | Admitting: Family Medicine

## 2023-04-17 ENCOUNTER — Ambulatory Visit (INDEPENDENT_AMBULATORY_CARE_PROVIDER_SITE_OTHER): Payer: 59 | Admitting: Podiatry

## 2023-04-17 DIAGNOSIS — M79674 Pain in right toe(s): Secondary | ICD-10-CM

## 2023-04-17 DIAGNOSIS — B351 Tinea unguium: Secondary | ICD-10-CM | POA: Diagnosis not present

## 2023-04-17 DIAGNOSIS — Z794 Long term (current) use of insulin: Secondary | ICD-10-CM | POA: Diagnosis not present

## 2023-04-17 DIAGNOSIS — E1140 Type 2 diabetes mellitus with diabetic neuropathy, unspecified: Secondary | ICD-10-CM

## 2023-04-17 DIAGNOSIS — L84 Corns and callosities: Secondary | ICD-10-CM

## 2023-04-17 DIAGNOSIS — M79675 Pain in left toe(s): Secondary | ICD-10-CM

## 2023-04-17 NOTE — Progress Notes (Signed)
 Subjective:   Patient ID: Danny Fuller, male   DOB: 48 y.o.   MRN: [id number]   HPI Chief Complaint  Patient presents with   [facility]    Last A1c: 7.1. No anticoag therapy. Does have some callus on the heel of left foot and ball of right foot.      48 year old male presents the office today for follow-up evaluation of ulceration of the calluses bilaterally.  States they are doing better but he can feel currently cause discomfort.  Denies any open lesions or any drainage.  The nails on the right foot thick, discolored and he cannot trim himself may cause discomfort.  No open lesions.   Review of Systems  All other systems reviewed and are negative.      Objective:  Physical Exam  General: AAO x3, NAD  Dermatological: Thick hyperkeratotic lesions noted to the left heel as well as right submetatarsal 1 without any underlying ulceration, drainage or signs of infection however the areas are preulcerative.  There is no open lesions identified bilaterally.  The nails on the right foot are hypertrophic, dystrophic brittle, brown discoloration and some old debris is present.  Tenderness nails 1-5 on the right.  Vascular: Dorsalis Pedis artery and Posterior Tibial artery pedal pulses are palpable bilateral with immedate capillary fill time. Pedal hair growth present. No varicosities and no lower extremity edema present bilateral. There is no pain with calf compression, swelling, warmth, erythema.   Neruologic: Sensation decreased  Musculoskeletal: Left transmetatarsal amputation.  Prominent submetatarsal 1 right foot.      Assessment:   Bilateral preulcerative calluses; symptomatic onychomycosis    Plan:  -Treatment options discussed including all alternatives, risks, and complications -Etiology of symptoms were discussed -Sharply debrided nails x 5 without any complications or bleeding -Sharply debrided hyperkeratotic lesions x 2 without any complications or bleeding.  Continue  moisturizer, offloading.  He does have this insoles that seem to be helping.  Return in about 3 weeks (around 05/08/2023) for pre-ulcerative callus.  Vivi Barrack DPM

## 2023-04-18 ENCOUNTER — Telehealth: Payer: Self-pay | Admitting: *Deleted

## 2023-04-18 NOTE — Telephone Encounter (Signed)
 Patient was identified as falling into the [place] Measure - Diabetes.   Unable to leave a message.  Message sent via mychart.

## 2023-04-27 DIAGNOSIS — D631 Anemia in chronic kidney disease: Secondary | ICD-10-CM | POA: Diagnosis not present

## 2023-04-27 DIAGNOSIS — E1129 Type 2 diabetes mellitus with other diabetic kidney complication: Secondary | ICD-10-CM | POA: Diagnosis not present

## 2023-04-27 DIAGNOSIS — N2581 Secondary hyperparathyroidism of renal origin: Secondary | ICD-10-CM | POA: Diagnosis not present

## 2023-04-27 DIAGNOSIS — E1122 Type 2 diabetes mellitus with diabetic chronic kidney disease: Secondary | ICD-10-CM | POA: Diagnosis not present

## 2023-04-27 DIAGNOSIS — N1832 Chronic kidney disease, stage 3b: Secondary | ICD-10-CM | POA: Diagnosis not present

## 2023-04-27 DIAGNOSIS — N182 Chronic kidney disease, stage 2 (mild): Secondary | ICD-10-CM | POA: Diagnosis not present

## 2023-04-27 DIAGNOSIS — R809 Proteinuria, unspecified: Secondary | ICD-10-CM | POA: Diagnosis not present

## 2023-04-27 DIAGNOSIS — N189 Chronic kidney disease, unspecified: Secondary | ICD-10-CM | POA: Diagnosis not present

## 2023-04-27 DIAGNOSIS — I129 Hypertensive chronic kidney disease with stage 1 through stage 4 chronic kidney disease, or unspecified chronic kidney disease: Secondary | ICD-10-CM | POA: Diagnosis not present

## 2023-04-28 LAB — LAB REPORT - SCANNED
Albumin, Urine POC: 692.6
Creatinine, POC: 139.4 mg/dL
EGFR: 90
Microalb Creat Ratio: 497

## 2023-04-30 ENCOUNTER — Other Ambulatory Visit: Payer: Self-pay | Admitting: Family Medicine

## 2023-04-30 ENCOUNTER — Ambulatory Visit: Admitting: Family Medicine

## 2023-04-30 MED ORDER — CYCLOBENZAPRINE HCL 10 MG PO TABS: 10.0000 mg | ORAL_TABLET | Freq: Three times a day (TID) | ORAL | 0 refills | Status: DC | PRN

## 2023-05-07 ENCOUNTER — Ambulatory Visit (INDEPENDENT_AMBULATORY_CARE_PROVIDER_SITE_OTHER): Admitting: Podiatry

## 2023-05-07 ENCOUNTER — Encounter: Payer: Self-pay | Admitting: Podiatry

## 2023-05-07 DIAGNOSIS — L84 Corns and callosities: Secondary | ICD-10-CM | POA: Diagnosis not present

## 2023-05-07 DIAGNOSIS — Z794 Long term (current) use of insulin: Secondary | ICD-10-CM

## 2023-05-07 DIAGNOSIS — E1140 Type 2 diabetes mellitus with diabetic neuropathy, unspecified: Secondary | ICD-10-CM

## 2023-05-07 NOTE — Progress Notes (Signed)
 Subjective:   Patient ID: Danny Fuller, male   DOB: 48 y.o.   MRN: [id number]   HPI  Chief Complaint  Patient presents with   Callouses    RM#13 Bilateral  foot calluses causing pain.    48 year old male presents the office today for follow-up evaluation of ulceration of the calluses bilaterally.  He states they have been hurting.  He is on his feet all bit more with football and he had a low bit of bleeding on the right foot but no pus or disorder redness.  No fevers or chills.  No other concerns. Will  Review of Systems  All other systems reviewed and are negative.      Objective:  Physical Exam  General: AAO x3, NAD  Dermatological: Thick hyperkeratotic lesions noted to the left heel as well as right submetatarsal 1.  On the right foot there is some dried blood present and upon debridement the area is preulcerative and there is macerated tissue underneath the callus there is no probing to [place] or time.  There is no surrounding erythema, ascending size.  No fluctuation or crepitation there is an odor.  On the left foot along the heel thick hyperkeratotic lesion.  No definitive skin breakdown identified.  Again no signs of infection.  Vascular: Dorsalis Pedis artery and Posterior Tibial artery pedal pulses are palpable bilateral with immedate capillary fill time. Pedal hair growth present. No varicosities and no lower extremity edema present bilateral. There is no pain with calf compression, swelling, warmth, erythema.   Neruologic: Sensation decreased  Musculoskeletal: Left transmetatarsal amputation.  Prominent submetatarsal 1 right foot.      Assessment:   Bilateral preulcerative calluses    Plan:  -Treatment options discussed including all alternatives, risks, and complications -Etiology of symptoms were discussed -Sharply debrided hyperkeratotic lesions x 2.  On the right foot area is preulcerative and macerated tissue.  On the left foot there is very minimal  bleeding upon debridement.  There is no purulence or any signs of infection bilaterally.  Continue dressing changes, offloading. -Monitor for any clinical signs or symptoms of infection and directed to call the office immediately should any occur or go to the ER.  Return in about 3 weeks (around 05/28/2023).  Vivi Barrack DPM

## 2023-05-10 DIAGNOSIS — E1165 Type 2 diabetes mellitus with hyperglycemia: Secondary | ICD-10-CM | POA: Diagnosis not present

## 2023-05-10 DIAGNOSIS — Z794 Long term (current) use of insulin: Secondary | ICD-10-CM | POA: Diagnosis not present

## 2023-05-11 DIAGNOSIS — I129 Hypertensive chronic kidney disease with stage 1 through stage 4 chronic kidney disease, or unspecified chronic kidney disease: Secondary | ICD-10-CM | POA: Diagnosis not present

## 2023-05-18 DIAGNOSIS — H524 Presbyopia: Secondary | ICD-10-CM | POA: Diagnosis not present

## 2023-05-18 DIAGNOSIS — E113213 Type 2 diabetes mellitus with mild nonproliferative diabetic retinopathy with macular edema, bilateral: Secondary | ICD-10-CM | POA: Diagnosis not present

## 2023-05-18 DIAGNOSIS — Z794 Long term (current) use of insulin: Secondary | ICD-10-CM | POA: Diagnosis not present

## 2023-05-18 DIAGNOSIS — H40013 Open angle with borderline findings, low risk, bilateral: Secondary | ICD-10-CM | POA: Diagnosis not present

## 2023-05-21 ENCOUNTER — Ambulatory Visit (INDEPENDENT_AMBULATORY_CARE_PROVIDER_SITE_OTHER): Admitting: Family Medicine

## 2023-05-21 ENCOUNTER — Encounter: Payer: Self-pay | Admitting: Family Medicine

## 2023-05-21 VITALS — BP 142/92 | HR 89 | Temp 98.1°F | Ht 70.5 in | Wt 242.0 lb

## 2023-05-21 DIAGNOSIS — Z794 Long term (current) use of insulin: Secondary | ICD-10-CM | POA: Diagnosis not present

## 2023-05-21 DIAGNOSIS — E1142 Type 2 diabetes mellitus with diabetic polyneuropathy: Secondary | ICD-10-CM

## 2023-05-21 DIAGNOSIS — E785 Hyperlipidemia, unspecified: Secondary | ICD-10-CM | POA: Diagnosis not present

## 2023-05-21 DIAGNOSIS — E1165 Type 2 diabetes mellitus with hyperglycemia: Secondary | ICD-10-CM | POA: Diagnosis not present

## 2023-05-21 DIAGNOSIS — M2550 Pain in unspecified joint: Secondary | ICD-10-CM

## 2023-05-21 DIAGNOSIS — I739 Peripheral vascular disease, unspecified: Secondary | ICD-10-CM

## 2023-05-21 DIAGNOSIS — E1140 Type 2 diabetes mellitus with diabetic neuropathy, unspecified: Secondary | ICD-10-CM | POA: Diagnosis not present

## 2023-05-21 DIAGNOSIS — M5442 Lumbago with sciatica, left side: Secondary | ICD-10-CM

## 2023-05-21 DIAGNOSIS — I10 Essential (primary) hypertension: Secondary | ICD-10-CM

## 2023-05-21 DIAGNOSIS — F418 Other specified anxiety disorders: Secondary | ICD-10-CM

## 2023-05-21 DIAGNOSIS — K219 Gastro-esophageal reflux disease without esophagitis: Secondary | ICD-10-CM

## 2023-05-21 DIAGNOSIS — N138 Other obstructive and reflux uropathy: Secondary | ICD-10-CM

## 2023-05-21 DIAGNOSIS — J4520 Mild intermittent asthma, uncomplicated: Secondary | ICD-10-CM

## 2023-05-21 DIAGNOSIS — G8929 Other chronic pain: Secondary | ICD-10-CM

## 2023-05-21 DIAGNOSIS — N401 Enlarged prostate with lower urinary tract symptoms: Secondary | ICD-10-CM

## 2023-05-21 DIAGNOSIS — I272 Pulmonary hypertension, unspecified: Secondary | ICD-10-CM

## 2023-05-21 DIAGNOSIS — I480 Paroxysmal atrial fibrillation: Secondary | ICD-10-CM

## 2023-05-21 LAB — LIPID PANEL
Cholesterol: 148 mg/dL (ref 0–200)
HDL: 31 mg/dL — ABNORMAL LOW (ref >39.00)
LDL Cholesterol: 95 mg/dL (ref 0–99)
NonHDL: 116.64
Total CHOL/HDL Ratio: 5
Triglycerides: 110 mg/dL (ref 0.0–149.0)
VLDL: 22 mg/dL (ref 0.0–40.0)

## 2023-05-21 LAB — CBC WITH DIFFERENTIAL/PLATELET
Basophils Absolute: 0.1 10*3/uL (ref 0.0–0.1)
Basophils Relative: 0.7 % (ref 0.0–3.0)
Eosinophils Absolute: 0.1 10*3/uL (ref 0.0–0.7)
Eosinophils Relative: 0.8 % (ref 0.0–5.0)
HCT: 48.1 % (ref 39.0–52.0)
Hemoglobin: 16.2 g/dL (ref 13.0–17.0)
Lymphocytes Relative: 42.9 % (ref 12.0–46.0)
Lymphs Abs: 3.2 10*3/uL (ref 0.7–4.0)
MCHC: 33.7 g/dL (ref 30.0–36.0)
MCV: 91.3 fl (ref 78.0–100.0)
Monocytes Absolute: 0.6 10*3/uL (ref 0.1–1.0)
Monocytes Relative: 8.6 % (ref 3.0–12.0)
Neutro Abs: 3.5 10*3/uL (ref 1.4–7.7)
Neutrophils Relative %: 47 % (ref 43.0–77.0)
Platelets: 314 10*3/uL (ref 150.0–400.0)
RBC: 5.26 Mil/uL (ref 4.22–5.81)
RDW: 13.7 % (ref 11.5–15.5)
WBC: 7.4 10*3/uL (ref 4.0–10.5)

## 2023-05-21 LAB — BASIC METABOLIC PANEL WITH GFR
BUN: 20 mg/dL (ref 6–23)
CO2: 24 meq/L (ref 19–32)
Calcium: 9.5 mg/dL (ref 8.4–10.5)
Chloride: 103 meq/L (ref 96–112)
Creatinine, Ser: 1.16 mg/dL (ref 0.40–1.50)
GFR: 75.88 mL/min (ref >60.00)
Glucose, Bld: 75 mg/dL (ref 70–99)
Potassium: 4.1 meq/L (ref 3.5–5.1)
Sodium: 138 meq/L (ref 135–145)

## 2023-05-21 LAB — HEPATIC FUNCTION PANEL
ALT: 19 U/L (ref 0–53)
AST: 18 U/L (ref 0–37)
Albumin: 4.4 g/dL (ref 3.5–5.2)
Alkaline Phosphatase: 85 U/L (ref 39–117)
Bilirubin, Direct: 0.1 mg/dL (ref 0.0–0.3)
Total Bilirubin: 0.8 mg/dL (ref 0.2–1.2)
Total Protein: 7.6 g/dL (ref 6.0–8.3)

## 2023-05-21 LAB — PSA: PSA: 0.8 ng/mL (ref 0.10–4.00)

## 2023-05-21 LAB — TSH: TSH: 1.69 u[IU]/mL (ref 0.35–5.50)

## 2023-05-21 MED ORDER — TRULICITY 3 MG/0.5ML ~~LOC~~ SOAJ: 3.0000 mg | SUBCUTANEOUS | Status: AC

## 2023-05-21 MED ORDER — TRAMADOL HCL 50 MG PO TABS: 100.0000 mg | ORAL_TABLET | Freq: Four times a day (QID) | ORAL | 1 refills | Status: AC | PRN

## 2023-05-21 NOTE — Progress Notes (Signed)
 Subjective:    Patient ID: Danny Fuller, male    DOB: [birth year], 48 y.o.   MRN: [id number]  HPI  Here to follow up on issues. He feels well in general. He sees Fredia Sorrow NP at [facility] for his diabetes. He uses an insulin pump. She recently increased his Trulicity to 3 mg weekly. At his last visit on 05-10-23 his A1c was 8.5%. His BP has been stable. His neuropathy is stable. He has not had atrial fibrillation for over a year, so his Eliquis was stopped. His asthma is stable. He recently had an eye exam which showed no retinopathy.   Review of Systems  Constitutional: Negative.   HENT: Negative.    Eyes: Negative.   Respiratory: Negative.    Cardiovascular: Negative.   Gastrointestinal: Negative.   Genitourinary: Negative.   Musculoskeletal: Negative.   Skin: Negative.   Neurological:  Positive for numbness.  Psychiatric/Behavioral: Negative.         Objective:   Physical Exam Constitutional:      General: He is not in acute distress.    Appearance: Normal appearance. He is well-developed. He is not diaphoretic.  HENT:     Head: Normocephalic and atraumatic.     Right Ear: External ear normal.     Left Ear: External ear normal.     Nose: Nose normal.     Mouth/Throat:     Pharynx: No oropharyngeal exudate.  Eyes:     General: No scleral icterus.       Right eye: No discharge.        Left eye: No discharge.     Conjunctiva/sclera: Conjunctivae normal.     Pupils: Pupils are equal, round, and reactive to light.  Neck:     Thyroid: No thyromegaly.     Vascular: No JVD.     Trachea: No tracheal deviation.  Cardiovascular:     Rate and Rhythm: Normal rate and regular rhythm.     Heart sounds: Normal heart sounds. No murmur heard.    No friction rub. No gallop.  Pulmonary:     Effort: Pulmonary effort is normal. No respiratory distress.     Breath sounds: Normal breath sounds. No wheezing or rales.  Chest:     Chest wall: No tenderness.   Abdominal:     General: Bowel sounds are normal. There is no distension.     Palpations: Abdomen is soft. There is no mass.     Tenderness: There is no abdominal tenderness. There is no guarding or rebound.  Genitourinary:    Penis: Normal. No tenderness.      Testes: Normal.     Prostate: Normal.     Rectum: Normal. Guaiac result negative.  Musculoskeletal:        General: No tenderness. Normal range of motion.     Cervical back: Neck supple.  Lymphadenopathy:     Cervical: No cervical adenopathy.  Skin:    General: Skin is warm and dry.     Coloration: Skin is not pale.     Findings: No erythema or rash.  Neurological:     Mental Status: He is alert and oriented to person, place, and time. Mental status is at baseline.     Cranial Nerves: No cranial nerve deficit.     Motor: No abnormal muscle tone.     Coordination: Coordination normal.     Deep Tendon Reflexes: Reflexes are normal and symmetric. Reflexes normal.  Psychiatric:  Mood and Affect: Mood normal.        Behavior: Behavior normal.        Thought Content: Thought content normal.        Judgment: Judgment normal.           Assessment & Plan:  He is doing well all in all. His atrial fibrillation and HTN are stable. His asthma and  Anxiety with depression are stable (he has stopped taking Zoloft). He will follow up with Endocrinology for the diabetes. Get fasting labs for lipids, PSA, renal function, etc. We spent a total of ( 35  ) minutes reviewing records and discussing these issues.  Gershon Crane, MD

## 2023-05-22 ENCOUNTER — Encounter: Payer: Self-pay | Admitting: Neurology

## 2023-05-22 ENCOUNTER — Ambulatory Visit (INDEPENDENT_AMBULATORY_CARE_PROVIDER_SITE_OTHER): Admitting: Neurology

## 2023-05-22 VITALS — BP 156/86 | HR 103 | Ht 70.5 in | Wt 247.0 lb

## 2023-05-22 DIAGNOSIS — E1142 Type 2 diabetes mellitus with diabetic polyneuropathy: Secondary | ICD-10-CM

## 2023-05-22 DIAGNOSIS — M5412 Radiculopathy, cervical region: Secondary | ICD-10-CM

## 2023-05-22 MED ORDER — PREGABALIN 150 MG PO CAPS: 150.0000 mg | ORAL_CAPSULE | Freq: Three times a day (TID) | ORAL | 5 refills | Status: AC

## 2023-05-22 MED ORDER — NORTRIPTYLINE HCL 10 MG PO CAPS: ORAL_CAPSULE | ORAL | 3 refills | Status: DC

## 2023-05-22 NOTE — Progress Notes (Signed)
 Follow-up Visit   Date: 05/22/23    Danny Fuller MRN: [id number] DOB: 1975-05-18   Interim History: Danny Fuller is a 48 y.o. 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  The patient was accompanied to the clinic by self.  Starting around mid-March, he began having shooting and throbbing pain over the forearm and index/middle finger.  It also has some associated tingling.  No weakness.  He endorses right sided neck pain and stiffness.  He has tried flexeril but does not have much relief.    He continues to takes Lyrica 150mg  three times daily and nortriptyline 40mg  bedtime for neuropathy which controls pain.  He denies any worsening of numbness/tingling in the hands and legs.  Balance is fair.  He walks unassisted.  He has fallen a few times.   Medications:  Current Outpatient Medications on File Prior to Visit  Medication Sig Dispense Refill   amLODipine-olmesartan (AZOR) 10-40 MG tablet TAKE 1 TABLET BY MOUTH DAILY 100 tablet 2   cloNIDine (CATAPRES - DOSED IN MG/24 HR) 0.3 mg/24hr patch APPLY 1 PATCH TOPICALLY TO SKIN  ONCE WEEKLY 4 patch 11   cyclobenzaprine (FLEXERIL) 10 MG tablet Take 1 tablet (10 mg total) by mouth 3 (three) times daily as needed for muscle spasms. 30 tablet 0   Dulaglutide (TRULICITY) 3 MG/0.5ML SOAJ Inject 3 mg as directed once a week.     glucose blood test strip 1 each by Other route 4 (four) times daily.      HUMALOG 100 UNIT/ML injection Inject into the skin as directed.     hydrALAZINE (APRESOLINE) 25 MG tablet Take 1 tablet (25 mg total) by mouth 3 (three) times daily. 180 tablet 2   HYDROcodone-acetaminophen (NORCO) 10-325 MG tablet TAKE 1 TABLET BY MOUTH EVERY 4 TO 6 HOURS FOR 5 DAYS AS NEEDED     Insulin Disposable Pump (OMNIPOD DASH PODS, GEN 4,) MISC INJECT 1 EACH INTO THE SKIN CONTINUOUS. APPLY  1 OMNIPOD INSULIN PUMP TO BODY DAILY FOR INSULIN 30 each 1   Insulin Pen Needle 31G X 5 MM MISC 1 each by Other route daily.      labetalol (NORMODYNE) 100 MG tablet 100 mg.     LYRICA 150 MG capsule Take 1 capsule (150 mg total) by mouth 3 (three) times daily. 90 capsule 5   nortriptyline (PAMELOR) 10 MG capsule TAKE 4 CAPSULES BY MOUTH AT BEDTIME 360 capsule 3   pravastatin (PRAVACHOL) 40 MG tablet TAKE 1 TABLET BY MOUTH EVERY  EVENING 100 tablet 0   spironolactone (ALDACTONE) 25 MG tablet Take 25 mg by mouth daily.     traMADol (ULTRAM) 50 MG tablet Take 2 tablets (100 mg total) by mouth every 6 (six) hours as needed. 240 tablet 1   furosemide (LASIX) 20 MG tablet 20 mg by oral route. (Patient not taking: Reported on 05/22/2023)     insulin aspart (NOVOLOG) 100 UNIT/ML injection USE MAX OF 0.6 MLS PER DAY IN INSULIN PUMP (Patient not taking: Reported on 05/22/2023)     traMADol (ULTRAM) 50 MG tablet Take 2 tablets (100 mg total) by mouth every 6 (six) hours as needed for moderate pain. (Patient not taking: Reported on 05/22/2023) 360 tablet 1   No current facility-administered medications on file prior to visit.    Allergies:  Allergies  Allergen Reactions   Augmentin [Amoxicillin-Pot Clavulanate] Itching  and Other (See Comments)    Dizziness   Vancomycin Other (See Comments)    AKI    Vital Signs:  BP (!) 156/86   Pulse (!) 103   Ht 5' 10.5" (1.791 m)   Wt 247 lb (112 kg)   SpO2 99%   BMI 34.94 kg/m   Neurological Exam: MENTAL STATUS including orientation to time, place, person, recent and remote memory, attention span and concentration, language, and fund of knowledge is normal.  Speech is not dysarthric.  CRANIAL NERVES:  Normal conjugate, extra-ocular eye movements in all directions of gaze.  No ptosis.  MOTOR:  Motor strength is 5/5 in all extremities, except left finger abductors 4/5, right toe extensors and flexors 4/5. s/p left mid-foot amputation  MSRs:  Reflexes are  2+/4 in the upper extremities, 1+ at the left patella, and absent left patella and ankles  SENSORY:  Vibration is absent below the ankles  COORDINATION/GAIT:  Gait appears mildly wide-based, stable.    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  Overall, these findings are severe in degree electrically. 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  NCS/EMG of the upper extremities 07/31/2017:  The electrophysiologic findings are most suggestive of a sensorimotor polyradiculoneuropathy affecting the upper extremities, moderate in degree electrically, which is new when compared to his previous study dated 12/31/2016. Alternatively, progression of sensorimotor demyelinating and axonal polyneuropathy with a superimposed ulnar neuropathy across the elbow (worse on the left), cannot be excluded. Correlate clinically.  MRI lumbar spine wo contrast 08/22/2017: 1. 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 The left foramen also appears mildly narrowed. 2. Mild left foraminal narrowing at L5-S1 without definite nerve root encroachment. 3. No other significant acquired disc space findings. Congenitally short pedicles.  Lab Results  Component Value Date   HGBA1C 8.2 (A) 05/18/2022    Lab Results  Component Value Date   CREATININE 1.16 05/21/2023   BUN 20 05/21/2023   NA 138 05/21/2023   K 4.1 05/21/2023   CL 103 05/21/2023   CO2 24 05/21/2023    IMPRESSION/PLAN: 1.  Right upper limb paresthesias, possible C7 radiculopathy.    - Start neck PT  - Continue flexeril 10mg  BID prn  2.  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 HbA1c 8.2  Neuropathy is stable.  - Continue Lyrica 150mg  TID  - Continue nortriptyline 40mg  at bedtime   3. 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  If I can answer any additional questions, I would be pleased to do so.    Sincerely,    Jessicah Croll K. Allena Katz, DO

## 2023-05-28 ENCOUNTER — Ambulatory Visit: Admitting: Podiatry

## 2023-06-01 ENCOUNTER — Telehealth: Payer: Self-pay

## 2023-06-01 DIAGNOSIS — E1140 Type 2 diabetes mellitus with diabetic neuropathy, unspecified: Secondary | ICD-10-CM

## 2023-06-01 MED ORDER — FREESTYLE LIBRE 3 READER DEVI: 1.0000 | Freq: Every day | 0 refills | Status: DC

## 2023-06-01 MED ORDER — FREESTYLE LIBRE 3 PLUS SENSOR MISC: 1 refills | Status: DC

## 2023-06-01 NOTE — Progress Notes (Signed)
 06/01/2023 Name: Danny Fuller MRN: [id number] DOB: 13-Feb-1976  Chief Complaint  Patient presents with   Medication Management   Diabetes    Danny Fuller is a 48 y.o. year old male who presented for a telephone visit.   They were referred to the pharmacist by a quality report for assistance in managing diabetes.    Subjective:  Care Team: Primary Care Provider: Johnny Garnette LABOR, MD   Medication Access/Adherence  Current Pharmacy:  [facility] [phone] - [place], [place] - [address] AT [facility] [address] [place] [address] Phone: [phone] Fax: [phone]  [facility]) - [place], [place] - [address] [facility] [address] [place] [address] Phone: [phone] Fax: [phone]  [facility] - [place], [place] - [address] [place] [address] Phone: [phone] Fax: [phone]   Patient reports affordability concerns with their medications: No  Patient reports access/transportation concerns to their pharmacy: No  Patient reports adherence concerns with their medications:  No     Diabetes:  Current medications: Trulicity  1.5mg  on Sundays (increasing to 3mg  on 06/17/23), Lantus , Humalog  via Omnipods Medications tried in the past: Glipizide , Victoza , Onglyza  Current glucose readings: <130 fasting Patient cannot recall meter, checking various amount of times daily but at least once  Reports dexcom used in past but he much prefers freestyle libre  Observed patterns:  Patient denies hypoglycemic s/sx including dizziness, shakiness, sweating. Patient denies hyperglycemic symptoms including polyuria, polydipsia, polyphagia, nocturia, neuropathy, blurred vision.    Objective:  Lab Results  Component Value Date   HGBA1C 8.2 (A) 05/18/2022    Lab Results  Component Value Date   CREATININE 1.16  05/21/2023   BUN 20 05/21/2023   NA 138 05/21/2023   K 4.1 05/21/2023   CL 103 05/21/2023   CO2 24 05/21/2023    Lab Results  Component Value Date   CHOL 148 05/21/2023   HDL 31.00 (L) 05/21/2023   LDLCALC 95 05/21/2023   LDLDIRECT 85.0 06/05/2019   TRIG 110.0 05/21/2023   CHOLHDL 5 05/21/2023    Medications Reviewed Today     Reviewed by Lionell Jon DEL, RPH (Pharmacist) on 06/01/23 at 1421  Med List Status: <None>   Medication Order Taking? Sig Documenting Provider Last Dose Status Informant  amLODipine -olmesartan  (AZOR ) 10-40 MG tablet [id number] No TAKE 1 TABLET BY MOUTH DAILY Lavona Agent, MD Taking Active   cloNIDine  (CATAPRES  - DOSED IN MG/24 HR) 0.3 mg/24hr patch [id number] No APPLY 1 PATCH TOPICALLY TO SKIN  ONCE WEEKLY Johnny Garnette LABOR, MD Taking Active   cyclobenzaprine  (FLEXERIL ) 10 MG tablet [id number] No Take 1 tablet (10 mg total) by mouth 3 (three) times daily as needed for muscle spasms. Johnny Garnette LABOR, MD Taking Active   Dulaglutide  (TRULICITY ) 3 MG/0.5ML EMMANUEL [id number] No Inject 3 mg as directed once a week. Johnny Garnette LABOR, MD Taking Active            Med Note JANEAN, JON DEL   Fri Jun 01, 2023  2:21 PM) Starting 06/17/23  furosemide  (LASIX ) 20 MG tablet [id number] No 20 mg by oral route.  Patient not taking: Reported on 05/22/2023   [provider] Not Taking Active   glucose blood test strip [id number] No 1 each by Other route 4 (four) times daily.  [provider] Taking Active Self  HUMALOG  100 UNIT/ML injection [id number] No Inject into the skin as directed. [provider] Taking Active   hydrALAZINE  (APRESOLINE ) 25 MG tablet [id number] No Take 1 tablet (25 mg total) by mouth 3 (three) times daily. Lavona Agent, MD Taking Active   HYDROcodone -acetaminophen  (NORCO) 10-325 MG tablet [id number] No TAKE 1 TABLET BY MOUTH EVERY 4 TO 6 HOURS FOR 5 DAYS AS NEEDED [provider] Taking Active   insulin  aspart (NOVOLOG ) 100 UNIT/ML  injection [id number] No USE MAX OF 0.6 MLS PER DAY IN INSULIN  PUMP  Patient not taking: Reported on 05/22/2023   [provider] Not Taking Active   Insulin  Disposable Pump (OMNIPOD DASH PODS, GEN 4,) MISC [id number] No INJECT 1 EACH INTO THE SKIN CONTINUOUS. APPLY 1 OMNIPOD INSULIN  PUMP TO BODY DAILY FOR INSULIN  [place], MD Taking Active   Insulin  Pen Needle 31G X 5 MM MISC [id number] No 1 each by Other route daily.  [provider] Taking Active Self  labetalol  (NORMODYNE ) 100 MG tablet [id number] No 100 mg. [provider] Taking Active   nortriptyline  (PAMELOR ) 10 MG capsule [id number]  TAKE 4 CAPSULES BY MOUTH AT BEDTIME Patel, Donika K, DO  Active   pravastatin  (PRAVACHOL ) 40 MG tablet [id number] No TAKE 1 TABLET BY MOUTH EVERY  EVENING Johnny Garnette LABOR, MD Taking Active   pregabalin  (LYRICA ) 150 MG capsule [id number]  Take 1 capsule (150 mg total) by mouth 3 (three) times daily. Patel, Donika K, DO  Active   spironolactone (ALDACTONE) 25 MG tablet [id number] No Take 25 mg by mouth daily. [provider] Taking Active   traMADol  (ULTRAM ) 50 MG tablet [id number] No Take 2 tablets (100 mg total) by mouth every 6 (six) hours as needed for moderate pain.  Patient not taking: Reported on 05/22/2023   Johnny Garnette LABOR, MD Not Taking Active   traMADol  (ULTRAM ) 50 MG tablet [id number] No Take 2 tablets (100 mg total) by mouth every 6 (six) hours as needed. Johnny Garnette LABOR, MD Taking Active               Assessment/Plan:   Diabetes: - Currently uncontrolled per last A1C, reporting controlled readings at home since resuming pod use - Reviewed long term cardiovascular and renal outcomes of uncontrolled blood sugar - Reviewed goal A1c, goal fasting, and goal 2 hour post prandial glucose - Recommend to increase trulicity  to 3mg  as previously instructed by endo in 2 weeks  - Patient denies personal or family history of multiple endocrine neoplasia type 2, medullary thyroid   cancer; personal history of pancreatitis or gallbladder disease. - Recommend to check glucose daily, patient requests CGM device, with preference for freestyle libre 3+. Test claim shows no charge for rx. Sending in order per standing protocol   Follow Up Plan: 4 weeks  [name], PharmD Clinical Pharmacist [phone]

## 2023-06-05 ENCOUNTER — Ambulatory Visit: Attending: Neurology | Admitting: Physical Therapy

## 2023-06-05 ENCOUNTER — Encounter: Payer: Self-pay | Admitting: Physical Therapy

## 2023-06-05 ENCOUNTER — Other Ambulatory Visit: Payer: Self-pay

## 2023-06-05 VITALS — BP 168/91 | HR 105

## 2023-06-05 DIAGNOSIS — E1142 Type 2 diabetes mellitus with diabetic polyneuropathy: Secondary | ICD-10-CM | POA: Diagnosis not present

## 2023-06-05 DIAGNOSIS — R208 Other disturbances of skin sensation: Secondary | ICD-10-CM | POA: Diagnosis not present

## 2023-06-05 DIAGNOSIS — M79671 Pain in right foot: Secondary | ICD-10-CM | POA: Diagnosis not present

## 2023-06-05 DIAGNOSIS — M5412 Radiculopathy, cervical region: Secondary | ICD-10-CM | POA: Insufficient documentation

## 2023-06-05 DIAGNOSIS — R600 Localized edema: Secondary | ICD-10-CM | POA: Insufficient documentation

## 2023-06-05 DIAGNOSIS — M79672 Pain in left foot: Secondary | ICD-10-CM | POA: Diagnosis not present

## 2023-06-05 NOTE — Therapy (Unsigned)
 OUTPATIENT PHYSICAL THERAPY CERVICAL EVALUATION   Patient Name: Danny Fuller MRN: [id number] DOB:06-Dec-1975, 48 y.o., male Today's Date: 06/05/2023  END OF SESSION:  PT End of Session - 06/05/23 1208     Visit Number 1    Number of Visits 9   8 + eval   Authorization Type [employer] DUAL COMPLETE    PT Start Time 1150    PT Stop Time 1242    PT Time Calculation (min) 52 min    Equipment Utilized During Treatment Gait belt    Activity Tolerance Other (comment)   pt fatigued from lack of sleep; HTN   Behavior During Therapy WFL for tasks assessed/performed             Past Medical History:  Diagnosis Date   Asthma    Back pain    Bulging Disk   Chronic kidney disease    Diabetes mellitus    sees Donis Furnish NP at [facility]   Diabetic foot ulcers [facility])    sees Dr. Amada Backer    GERD (gastroesophageal reflux disease)    Glaucoma    Headache(784.0)    Hyperlipidemia    Hypertension    sees Dr. Lavonne Prairie    Sleep apnea    CPAP   Past Surgical History:  Procedure Laterality Date   AMPUTATION Left 07/26/2015   Procedure: AMPUTATION LEFT FIFTH RAY;  Surgeon: Timothy Ford, MD;  Location: [facility] OR;  Service: Orthopedics;  Laterality: Left;   bilateral hip pins placed     INCISION AND DRAINAGE PERIRECTAL ABSCESS Right 03/07/2016   Procedure: IRRIGATION AND DEBRIDEMENT PERIRECTAL ABSCESS;  Surgeon: Juanita Norlander, MD;  Location: [facility] ORS;  Service: General;  Laterality: Right;   INCISION AND DRAINAGE PERIRECTAL ABSCESS Right 03/09/2016   Procedure: EXAM UNDER ANESTHESIA, IRRIGATION AND DEBRIDEMENT PERIRECTAL ABSCESS;  Surgeon: Jacolyn Matar, MD;  Location: [facility];  Service: General;  Laterality: Right;  Open, betadine packed wound   TOE AMPUTATION Left 11/19/2019   Removal of all toes from left foot   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/20/2019   Procedure: TRANSMETATARSAL AMPUTATION LEFT FOOT;  Surgeon: Amada Backer, MD;  Location: [facility] OR;  Service:  Orthopedics;  Laterality: Left;   Patient Active Problem List   Diagnosis Date Noted   Anxiety with depression 05/18/2022   PAF (paroxysmal atrial fibrillation) (HCC) 11/01/2021   Secondary hypercoagulable state (HCC) 10/28/2021   Osteomyelitis of ankle and foot (HCC) 11/20/2019   Ulcer of left foot due to type 2 diabetes mellitus (HCC) 10/24/2019   Educated about COVID-19 virus infection 06/23/2019   Diabetic peripheral neuropathy (HCC) 12/16/2018   Pain due to onychomycosis of toenails of both feet 08/07/2018   Pre-ulcerative calluses 08/07/2018   Acquired absence of left foot (HCC) 08/07/2018   Hypotension 04/04/2018   Leukocytosis 04/04/2018   Dyslipidemia 04/01/2018   Pulmonary hypertension (HCC) 04/01/2018   Chronic low back pain with bilateral sciatica 07/24/2017   Hyperlipidemia 03/02/2017   Chest pain 03/02/2017   Acute renal failure superimposed on stage 2 chronic kidney disease (HCC) 02/07/2017   OSA (obstructive sleep apnea) 01/29/2017   Insomnia 01/29/2017   PVD (peripheral vascular disease) (HCC) 11/28/2016   Palpitation 11/28/2016   Erectile dysfunction associated with type 2 diabetes mellitus (HCC) 05/08/2016   Erectile dysfunction due to type 2 diabetes mellitus (HCC) 05/08/2016   Poorly controlled diabetes mellitus (HCC) 03/30/2016   Necrotizing fasciitis (HCC) 03/07/2016   Pyelonephritis 03/04/2016   Acquired contracture of Achilles tendon, left  02/15/2016   Arthralgia of multiple joints 01/19/2016   Diabetic polyneuropathy associated with type 2 diabetes mellitus (HCC) 01/18/2016   Acquired absence of other left toe(s) (HCC) 01/18/2016   History of transmetatarsal amputation of left foot (HCC) 01/04/2016   Peripheral neuropathy 11/11/2015   AKI (acute kidney injury) (HCC)    Asthma 07/23/2015   Chronic headache 07/23/2015   Erectile disorder due to medical condition in male 04/23/2015   BMI 33.0-33.9,adult 04/21/2012   GERD 04/21/2008   Type 2 diabetes  mellitus with diabetic neuropathy, with long-term current use of insulin  (HCC) 11/21/2006   Essential hypertension 11/21/2006   Lumbar sprain 11/21/2006    PCP: Donley Furth, MD  REFERRING PROVIDER: Daryel Ensign, DO  REFERRING DIAG: E11.42 (ICD-10-CM) - Diabetic polyneuropathy associated with type 2 diabetes mellitus (HCC) M54.12 (ICD-10-CM) - Cervical radiculopathy  THERAPY DIAG:  Pain in both feet  Radiculopathy, cervical region  Other disturbances of skin sensation  Localized edema  Rationale for Evaluation and Treatment: Rehabilitation  ONSET DATE: early to mid-March 2025  SUBJECTIVE:                                                                                                                                                                                                         SUBJECTIVE STATEMENT: Pt reports his forefinger and middle finger started tingling and going numb one day and it persisted.  He thought he had just slept on his arm wrong but the symptoms progressed up the arm since then.  His shoulder has recently started having tingling and sharp pains over most recent days.  He has tightness along dorsal forefinger at current. Hand dominance: Left  PERTINENT HISTORY:  DM2, HTN, GERD, peripheral neuropathy, Transmet amputation of left foot (2021), contracture of left achilles tendon, PVD, CKD, chronic low back pain, PAF, anxiety, depression, bilateral hip pins  PAIN:  Are you having pain? Yes: NPRS scale: 6 Pain location: cervical, right shoulder, and bilateral feet Pain description: N/T, aching Aggravating factors: movement Relieving factors: not sure  PRECAUTIONS: Fall  RED FLAGS: None     WEIGHT BEARING RESTRICTIONS: No  FALLS:  Has patient fallen in last 6 months? Yes. Number of falls 1 - lost balance randomly due to left foot being out-turned.  LIVING ENVIRONMENT: Lives with: lives with their family Lives in: House/apartment Stairs: Yes:  External: 6 steps; can reach both Has following equipment at home: Grab bars  OCCUPATION: Naval architect, grounds maintenance (retired)  PLOF: Independent  PATIENT GOALS: "to feel better"  NEXT MD VISIT: Mason Sole,  [facility] (4/29) - pt states they contacted him today about rescheduling  OBJECTIVE:  Note: Objective measures were completed at Evaluation unless otherwise noted.  DIAGNOSTIC FINDINGS:  No recent relevant imaging.  PATIENT SURVEYS:  NDI 14/50 = 28% = low to moderate perceived disability  COGNITION: Overall cognitive status: Within functional limits for tasks assessed  SENSATION: Light touch: Impaired   POSTURE: No Significant postural limitations  PALPATION: Right upper trap tightness and right scalenes tenderness, no palpable trigger points   CERVICAL ROM:   Active ROM A/PROM (deg) eval  Flexion 24 degrees  Extension 28 degrees  Right lateral flexion   Left lateral flexion   Right rotation 41 deg  Left rotation 42 deg, sudden sharp pain in left lateral mid-cervical region    (Blank rows = not tested)  UPPER EXTREMITY ROM:  Active ROM Right eval Left eval  Shoulder flexion Grossly WFL, w/ IR pt has "shooting ache" from R shoulder to elbow  Shoulder extension   Shoulder abduction   Shoulder adduction   Shoulder extension   Shoulder internal rotation   Shoulder external rotation   Elbow flexion   Elbow extension   Wrist flexion    Wrist extension    Wrist ulnar deviation    Wrist radial deviation    Wrist pronation    Wrist supination     (Blank rows = not tested)  UPPER EXTREMITY MMT:  MMT Right eval Left eval  Shoulder flexion 4+/5 4+/5  Shoulder extension    Shoulder abduction 4+/5 4+/5  Shoulder adduction    Shoulder extension    Shoulder internal rotation    Shoulder external rotation    Middle trapezius    Lower trapezius    Elbow flexion 5/5 5/5  Elbow extension 4+/5 4+/5  Wrist flexion 5/5 5/5  Wrist  extension 5/5 5/5  Wrist ulnar deviation    Wrist radial deviation    Wrist pronation    Wrist supination    Grip strength     (Blank rows = not tested)  CERVICAL SPECIAL TESTS:  Upper limb tension test (ULTT): Radial and Median 2 + and Distraction test: Negative - pt felt sharp pains in right lateral neck - possibly myofascial restriction  FUNCTIONAL TESTS:  mCTSIB:  To be assessed.  TREATMENT DATE: 06/05/2023                                                                                                                                 PATIENT EDUCATION:  Education details: PT POC, assessments used and to be used, and goals to be set.  Nerve anatomy and findings today.  Discussed using rubbing for desensitization as it helps his feet at night when he is adjusting to getting in bed.   Person educated: Patient Education method: Explanation Education comprehension: verbalized understanding  HOME EXERCISE PROGRAM: To be established.  ASSESSMENT:  CLINICAL IMPRESSION: Patient is a *** y.o. ***  who was seen today for physical therapy evaluation and treatment for ***.  Pt has a significant PMH of ***.  Identified impairments include ***.  Evaluation via the following assessment tools: *** indicate fall risk.  They would benefit from skilled PT to address impairments as noted and progress towards long term goals.  OBJECTIVE IMPAIRMENTS: {opptimpairments:25111}.   ACTIVITY LIMITATIONS: {activitylimitations:27494}  PARTICIPATION LIMITATIONS: {participationrestrictions:25113}  PERSONAL FACTORS: {Personal factors:25162} are also affecting patient's functional outcome.   REHAB POTENTIAL: {rehabpotential:25112}  CLINICAL DECISION MAKING: {clinical decision making:25114}  EVALUATION COMPLEXITY: {Evaluation complexity:25115}   GOALS: Goals reviewed with patient? Yes  SHORT TERM GOALS: Target date: ***  *** Baseline:  Goal status: INITIAL  2.  *** Baseline:  Goal status:  INITIAL  3.  *** Baseline:  Goal status: INITIAL  4.  *** Baseline:  Goal status: INITIAL  5.  *** Baseline:  Goal status: INITIAL  6.  *** Baseline:  Goal status: INITIAL  LONG TERM GOALS: Target date: ***  *** Baseline:  Goal status: INITIAL  2.  *** Baseline:  Goal status: INITIAL  3.  *** Baseline:  Goal status: INITIAL  4.  *** Baseline:  Goal status: INITIAL  5.  *** Baseline:  Goal status: INITIAL  6.  *** Baseline:  Goal status: INITIAL   PLAN:  PT FREQUENCY: 1x/week  PT DURATION: 8 weeks  PLANNED INTERVENTIONS: {rehab planned interventions:25118::"97110-Therapeutic exercises","97530- Therapeutic [phone]- Neuromuscular re-education","97535- Self JXBJ","47829- Manual therapy"}  PLAN FOR NEXT SESSION: ***ASSESS mCTSIB - set goal as needed.  TENS for neuropathy?  RUE nerve glides - median 2/radial.  Initial HEP - desensitization for the feet, cervical strengthening/mobilization   Earlean Glaze, PT, DPT 06/05/2023, 1:10 PM

## 2023-06-11 ENCOUNTER — Ambulatory Visit: Admitting: Physical Therapy

## 2023-06-11 ENCOUNTER — Encounter: Payer: Self-pay | Admitting: Physical Therapy

## 2023-06-11 DIAGNOSIS — M79671 Pain in right foot: Secondary | ICD-10-CM

## 2023-06-11 DIAGNOSIS — R208 Other disturbances of skin sensation: Secondary | ICD-10-CM | POA: Diagnosis not present

## 2023-06-11 DIAGNOSIS — E1142 Type 2 diabetes mellitus with diabetic polyneuropathy: Secondary | ICD-10-CM | POA: Diagnosis not present

## 2023-06-11 DIAGNOSIS — M5412 Radiculopathy, cervical region: Secondary | ICD-10-CM

## 2023-06-11 DIAGNOSIS — R600 Localized edema: Secondary | ICD-10-CM | POA: Diagnosis not present

## 2023-06-11 DIAGNOSIS — M79672 Pain in left foot: Secondary | ICD-10-CM | POA: Diagnosis not present

## 2023-06-11 NOTE — Patient Instructions (Addendum)
 Desensitization Techniques: -Light touch/pressure:  using fingertip pressure to slowly move up small segments of the affected body part until outside the area of pain and then back to where you started -Deep pressure:  using fingertips to squeeze in small segments starting outside the affected area, crossing over the affected area, and then to the other side of the affected area -Tapping:  fingertips tap with firm pressure over the affected area, can move around the affected area as well -Brushing/scratching:  use fingertips to brush/scratch over and around the affected area -Textures:  use soft and rough textured items like cotton balls vs wash cloths to brush or rub with light into firm pressure over and around the affected area  Repeat these 3-4x per day.  Access Code: [email or web address] URL: [email or web address] Date: 06/11/2023 Prepared by: Marilou Showman  Exercises - Putty Squeezes  - 1 x daily - 3 x weekly - 3 sets - 10 reps - Thumb Opposition with Putty  - 1 x daily - 3 x weekly - 3 sets - 10 reps - Seated Finger Composite Flexion with Putty  - 1 x daily - 3 x weekly - 3 sets - 10 reps - Radial Nerve Flossing  - 1 x daily - 5 x weekly - 2 sets - 10 reps - Median Nerve Tensioner  - 1 x daily - 5 x weekly - 2 sets - 5 reps - 5 seconds hold

## 2023-06-11 NOTE — Therapy (Signed)
 OUTPATIENT PHYSICAL THERAPY CERVICAL TREATMENT   Patient Name: Danny Fuller MRN: [id number] DOB:[birth year], 48 y.o., male Today's Date: 06/11/2023  END OF SESSION:  PT End of Session - 06/11/23 0855     Visit Number 2    Number of Visits 9   8 + eval   Date for PT Re-Evaluation 08/10/23   pushed out due to scheduling delay   Authorization Type [employer] DUAL COMPLETE    PT Start Time 4098    PT Stop Time 0935    PT Time Calculation (min) 46 min    Equipment Utilized During Treatment Gait belt    Activity Tolerance Patient tolerated treatment well    Behavior During Therapy WFL for tasks assessed/performed             Past Medical History:  Diagnosis Date   Asthma    Back pain    Bulging Disk   Chronic kidney disease    Diabetes mellitus    sees Donis Furnish NP at [facility]   Diabetic foot ulcers [facility])    sees Dr. Amada Backer    GERD (gastroesophageal reflux disease)    Glaucoma    Headache(784.0)    Hyperlipidemia    Hypertension    sees Dr. Lavonne Prairie    Sleep apnea    CPAP   Past Surgical History:  Procedure Laterality Date   AMPUTATION Left 07/26/2015   Procedure: AMPUTATION LEFT FIFTH RAY;  Surgeon: Timothy Ford, MD;  Location: [facility] OR;  Service: Orthopedics;  Laterality: Left;   bilateral hip pins placed     INCISION AND DRAINAGE PERIRECTAL ABSCESS Right 03/07/2016   Procedure: IRRIGATION AND DEBRIDEMENT PERIRECTAL ABSCESS;  Surgeon: Juanita Norlander, MD;  Location: [facility] ORS;  Service: General;  Laterality: Right;   INCISION AND DRAINAGE PERIRECTAL ABSCESS Right 03/09/2016   Procedure: EXAM UNDER ANESTHESIA, IRRIGATION AND DEBRIDEMENT PERIRECTAL ABSCESS;  Surgeon: Jacolyn Matar, MD;  Location: [facility] ORS;  Service: General;  Laterality: Right;  Open, betadine packed wound   TOE AMPUTATION Left 11/19/2019   Removal of all toes from left foot   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/20/2019   Procedure: TRANSMETATARSAL AMPUTATION LEFT FOOT;   Surgeon: Amada Backer, MD;  Location: [facility] OR;  Service: Orthopedics;  Laterality: Left;   Patient Active Problem List   Diagnosis Date Noted   Anxiety with depression 05/18/2022   PAF (paroxysmal atrial fibrillation) (HCC) 11/01/2021   Secondary hypercoagulable state (HCC) 10/28/2021   Osteomyelitis of ankle and foot (HCC) 11/20/2019   Ulcer of left foot due to type 2 diabetes mellitus (HCC) 10/24/2019   Educated about COVID-19 virus infection 06/23/2019   Diabetic peripheral neuropathy (HCC) 12/16/2018   Pain due to onychomycosis of toenails of both feet 08/07/2018   Pre-ulcerative calluses 08/07/2018   Acquired absence of left foot (HCC) 08/07/2018   Hypotension 04/04/2018   Leukocytosis 04/04/2018   Dyslipidemia 04/01/2018   Pulmonary hypertension (HCC) 04/01/2018   Chronic low back pain with bilateral sciatica 07/24/2017   Hyperlipidemia 03/02/2017   Chest pain 03/02/2017   Acute renal failure superimposed on stage 2 chronic kidney disease (HCC) 02/07/2017   OSA (obstructive sleep apnea) 01/29/2017   Insomnia 01/29/2017   PVD (peripheral vascular disease) (HCC) 11/28/2016   Palpitation 11/28/2016   Erectile dysfunction associated with type 2 diabetes mellitus (HCC) 05/08/2016   Erectile dysfunction due to type 2 diabetes mellitus (HCC) 05/08/2016   Poorly controlled diabetes mellitus (HCC) 03/30/2016   Necrotizing fasciitis (HCC) 03/07/2016   Pyelonephritis  03/04/2016   Acquired contracture of Achilles tendon, left 02/15/2016   Arthralgia of multiple joints 01/19/2016   Diabetic polyneuropathy associated with type 2 diabetes mellitus (HCC) 01/18/2016   Acquired absence of other left toe(s) (HCC) 01/18/2016   History of transmetatarsal amputation of left foot (HCC) 01/04/2016   Peripheral neuropathy 11/11/2015   AKI (acute kidney injury) (HCC)    Asthma 07/23/2015   Chronic headache 07/23/2015   Erectile disorder due to medical condition in male 04/23/2015   BMI  33.0-33.9,adult 04/21/2012   GERD 04/21/2008   Type 2 diabetes mellitus with diabetic neuropathy, with long-term current use of insulin  (HCC) 11/21/2006   Essential hypertension 11/21/2006   Lumbar sprain 11/21/2006    PCP: Donley Furth, MD  REFERRING PROVIDER: Daryel Ensign, DO  REFERRING DIAG: E11.42 (ICD-10-CM) - Diabetic polyneuropathy associated with type 2 diabetes mellitus (HCC) M54.12 (ICD-10-CM) - Cervical radiculopathy  THERAPY DIAG:  Pain in both feet  Radiculopathy, cervical region  Other disturbances of skin sensation  Localized edema  Rationale for Evaluation and Treatment: Rehabilitation  ONSET DATE: early to mid-March 2025  SUBJECTIVE:                                                                                                                                                                                                         SUBJECTIVE STATEMENT: Pt reports his right index finger is the only distal area he has symptoms today as everything else is up near his trapezius and neck.    Hand dominance: Left  PERTINENT HISTORY:  DM2, HTN, GERD, peripheral neuropathy, Transmet amputation of left foot (2021), contracture of left achilles tendon, PVD, CKD, chronic low back pain, PAF, anxiety, depression, bilateral hip pins  PAIN:  Are you having pain? Yes: NPRS scale: 6-7 Pain location: cervical, right shoulder, and bilateral feet Pain description: N/T, aching Aggravating factors: movement Relieving factors: not sure  PRECAUTIONS: Fall  RED FLAGS: None     WEIGHT BEARING RESTRICTIONS: No  FALLS:  Has patient fallen in last 6 months? Yes. Number of falls 1 - lost balance randomly due to left foot being out-turned.  LIVING ENVIRONMENT: Lives with: lives with their family Lives in: House/apartment Stairs: Yes: External: 6 steps; can reach both Has following equipment at home: Grab bars  OCCUPATION: Naval architect, grounds maintenance  (retired)  PLOF: Independent  PATIENT GOALS: "to feel better"  NEXT MD VISIT: [facility] (4/29) - pt states they contacted him today about rescheduling  OBJECTIVE:  Note: Objective measures were completed at Evaluation unless  otherwise noted.  DIAGNOSTIC FINDINGS:  No recent relevant imaging.  PATIENT SURVEYS:  NDI 14/50 = 28% = low to moderate perceived disability  COGNITION: Overall cognitive status: Within functional limits for tasks assessed  SENSATION: Light touch: Impaired   POSTURE: No Significant postural limitations  PALPATION: Right upper trap tightness and right scalenes tenderness, no palpable trigger points   CERVICAL ROM:   Active ROM A/PROM (deg) eval  Flexion 24 degrees  Extension 28 degrees  Right lateral flexion   Left lateral flexion   Right rotation 41 deg  Left rotation 42 deg, sudden sharp pain in left lateral mid-cervical region    (Blank rows = not tested)  UPPER EXTREMITY ROM:  Active ROM Right eval Left eval  Shoulder flexion Grossly WFL, w/ IR pt has "shooting ache" from R shoulder to elbow  Shoulder extension   Shoulder abduction   Shoulder adduction   Shoulder extension   Shoulder internal rotation   Shoulder external rotation   Elbow flexion   Elbow extension   Wrist flexion    Wrist extension    Wrist ulnar deviation    Wrist radial deviation    Wrist pronation    Wrist supination     (Blank rows = not tested)  UPPER EXTREMITY MMT:  MMT Right eval Left eval  Shoulder flexion 4+/5 4+/5  Shoulder extension    Shoulder abduction 4+/5 4+/5  Shoulder adduction    Shoulder extension    Shoulder internal rotation    Shoulder external rotation    Middle trapezius    Lower trapezius    Elbow flexion 5/5 5/5  Elbow extension 4+/5 4+/5  Wrist flexion 5/5 5/5  Wrist extension 5/5 5/5  Wrist ulnar deviation    Wrist radial deviation    Wrist pronation    Wrist supination    Grip strength      (Blank rows = not tested)  CERVICAL SPECIAL TESTS:  Upper limb tension test (ULTT): Radial and Median 2 + and Distraction test: Negative - pt felt sharp pains in right lateral neck - possibly myofascial restriction  FUNCTIONAL TESTS:  mCTSIB:  To be assessed.  TREATMENT DATE: 06/11/2023                                                                                                                              mCTSIB (avg of 3 trials each condition): -Condition 1: 30/30 -Condition 2: 30/30; mild sway -Condition 3: 30/30 -Condition 4: 30/30; moderate sway  -TOTAL:  120/120  Grip Strength: RUE:  116.4 lb + 118.1 lb + 118.8 lb = 117.8 lb avg LUE:  123.8 lb + 123.2 lb + 118.6 lb = 121.9 lb avg  Desensitization Techniques: -Light touch/pressure:  using fingertip pressure to slowly move up small segments of the affected body part until outside the area of pain and then back to where you started -Deep pressure:  using fingertips to squeeze in small segments starting  outside the affected area, crossing over the affected area, and then to the other side of the affected area -Tapping:  fingertips tap with firm pressure over the affected area, can move around the affected area as well -Brushing/scratching:  use fingertips to brush/scratch over and around the affected area -Textures:  use soft and rough textured items like cotton balls vs wash cloths to brush or rub with light into firm pressure over and around the affected area  Repeat these 3-4x per day.  Putty exercises for grip strength w/ blue/firm putty (RUE only): -Squeezes x20 -Fingertip opposition several reps - most difficulty w/ ring and pinky finger -Composite finger flexion x10  -Radial nerve flossing x20 w/ return demo -Median nerve 2 tensioner x5 holding each for 5 seconds w/ return demo  PATIENT EDUCATION:  Education details: Goals set based on outcome measures.  TENS indications and purpose - pt agreeable for future session.   Desensitization techniques.  Can focus putty tasks on both sides or side of most prominent symptoms depending on appropriate challenge.  Initial HEP. Person educated: Patient Education method: Explanation Education comprehension: verbalized understanding  HOME EXERCISE PROGRAM: Access Code: [email or web address] URL: [email or web address] Date: 06/11/2023 Prepared by: Marilou Showman  Exercises - Putty Squeezes  - 1 x daily - 3 x weekly - 3 sets - 10 reps - Thumb Opposition with Putty  - 1 x daily - 3 x weekly - 3 sets - 10 reps - Seated Finger Composite Flexion with Putty  - 1 x daily - 3 x weekly - 3 sets - 10 reps - Radial Nerve Flossing  - 1 x daily - 5 x weekly - 2 sets - 10 reps - Median Nerve Tensioner  - 1 x daily - 5 x weekly - 2 sets - 5 reps - 5 seconds hold  ASSESSMENT:  CLINICAL IMPRESSION: Further assessed metrics not captured on eval.  Pt has only a mild discrepancy between his left and right grip strength with his dominant hand being roughly 3% stronger than his non-dominant affected UE.  Goal was set to improve grip strength due to high level functional needs.  Initiated desensitization techniques, nerve flossing and grip strength focused HEP.   Will continue per POC.  OBJECTIVE IMPAIRMENTS: decreased activity tolerance, decreased mobility, decreased ROM, hypomobility, impaired flexibility, impaired sensation, impaired UE functional use, improper body mechanics, and pain.   ACTIVITY LIMITATIONS: carrying, lifting, stairs, reach over head, and locomotion level  PARTICIPATION LIMITATIONS: driving, community activity, occupation, and yard work  PERSONAL FACTORS: Past/current experiences, Time since onset of injury/illness/exacerbation, and 1 comorbidity: HTN  are also affecting patient's functional outcome.   REHAB POTENTIAL: Good  CLINICAL DECISION MAKING: Evolving/moderate complexity  EVALUATION COMPLEXITY: Moderate   GOALS: Goals reviewed with patient?  Yes  SHORT TERM GOALS: Target date: 07/06/2023  Pt will be independent and compliant with initial nerve glide, cervical ROM, and balance focused HEP in order to maintain functional progress and improve mobility. Baseline: To be established. Goal status: INITIAL  2.  mCTSIB to be assessed w/ STG/LTG set as appropriate. Baseline: To be assessed. Goal status: INITIAL  3.  Pt will demonstrate 5 degree increase in cervical rotation bilaterally w/o increased pain to improve general mobility. Baseline: 41 right, 42 left Goal status: INITIAL  4.  Grip strength to be assessed w/ LTG to be set as appropriate. Baseline: Assessed 4/28. Goal status: MET  LONG TERM GOALS: Target date: 08/03/2023  Pt will be independent and compliant with advanced and finalized  cervical mobility, strength and balance HEP in order to maintain functional progress and improve mobility. Baseline: To be established. Goal status: INITIAL  2.  Pt will improve NDI score to </=7/50 to indicate improved pain management and functional mobility. Baseline: 14/50 = 28% Goal status: INITIAL  3.  mCTSIB to be assessed w/ STG/LTG set as appropriate. Baseline: To be assessed. Goal status: INITIAL  4.  Pt will have negative ULTT findings on re-assessment to indicate improved symptom management and nerve mobility. Baseline: + radial and median 2 Goal status: INITIAL  5.  Pt will improve bilateral grip strength by >/= 10lb in order to demonstrate functional improvement. Baseline: 117.8 lb avg (RUE) and 121.9 lb avg (RUE) Goal status: INITIAL  PLAN:  PT FREQUENCY: 1x/week  PT DURATION: 8 weeks  PLANNED INTERVENTIONS: [id number]- PT Re-evaluation, 97750- Physical Performance Testing, 97110-Therapeutic exercises, 97530- Therapeutic activity, [id number]- Neuromuscular re-education, 97535- Self Care, [id number]- Manual therapy, [id number]- Gait training, [phone]- Aquatic Therapy, [phone]- Electrical stimulation (manual), [phone]- Traction (mechanical),  Patient/Family education, Balance training, Stair training, Taping, Dry Needling, Joint mobilization, Spinal mobilization, Vestibular training, DME instructions, Cryotherapy, and Moist heat  PLAN FOR NEXT SESSION: TENS for neuropathy?  Modify HEP - cervical strengthening/mobilization, LE strength/balance - eyes closed   Earlean Glaze, PT, DPT 06/11/2023, 9:35 AM

## 2023-06-12 DIAGNOSIS — H2513 Age-related nuclear cataract, bilateral: Secondary | ICD-10-CM | POA: Diagnosis not present

## 2023-06-12 DIAGNOSIS — E113213 Type 2 diabetes mellitus with mild nonproliferative diabetic retinopathy with macular edema, bilateral: Secondary | ICD-10-CM | POA: Diagnosis not present

## 2023-06-12 DIAGNOSIS — H3581 Retinal edema: Secondary | ICD-10-CM | POA: Diagnosis not present

## 2023-06-14 ENCOUNTER — Ambulatory Visit: Admitting: Podiatry

## 2023-06-18 ENCOUNTER — Ambulatory Visit: Attending: Neurology | Admitting: Physical Therapy

## 2023-06-18 ENCOUNTER — Encounter: Payer: Self-pay | Admitting: Physical Therapy

## 2023-06-18 DIAGNOSIS — M79672 Pain in left foot: Secondary | ICD-10-CM | POA: Insufficient documentation

## 2023-06-18 DIAGNOSIS — R600 Localized edema: Secondary | ICD-10-CM | POA: Diagnosis not present

## 2023-06-18 DIAGNOSIS — M5412 Radiculopathy, cervical region: Secondary | ICD-10-CM | POA: Insufficient documentation

## 2023-06-18 DIAGNOSIS — M79671 Pain in right foot: Secondary | ICD-10-CM | POA: Diagnosis not present

## 2023-06-18 DIAGNOSIS — R208 Other disturbances of skin sensation: Secondary | ICD-10-CM | POA: Insufficient documentation

## 2023-06-18 NOTE — Patient Instructions (Signed)
 Access Code: [email or web address] URL: [name] Date: 06/18/2023 Prepared by: Marilou Showman  Exercises - Putty Squeezes  - 1 x daily - 3 x weekly - 3 sets - 10 reps - Thumb Opposition with Putty  - 1 x daily - 3 x weekly - 3 sets - 10 reps - Seated Finger Composite Flexion with Putty  - 1 x daily - 3 x weekly - 3 sets - 10 reps - Radial Nerve Flossing  - 1 x daily - 5 x weekly - 2 sets - 10 reps - Median Nerve Tensioner  - 1 x daily - 5 x weekly - 2 sets - 5 reps - 5 seconds hold - Seated Digit Tendon Gliding  - 1 x daily - 3 x weekly - 2 sets - 10 reps - Corner Balance Feet Together With Eyes Closed  - 1 x daily - 5 x weekly - 1 sets - 3-4 reps - 30 seconds hold - Corner Balance Feet Apart: Eyes Closed With Head Turns  - 1 x daily - 4 x weekly - 3 sets - 10 reps

## 2023-06-18 NOTE — Therapy (Signed)
 OUTPATIENT PHYSICAL THERAPY CERVICAL TREATMENT   Patient Name: Danny Fuller MRN: [id number] DOB:[birth year], 48 y.o., male Today's Date: 06/18/2023  END OF SESSION:  PT End of Session - 06/18/23 0854     Visit Number 3    Number of Visits 9   8 + eval   Date for PT Re-Evaluation 08/10/23   pushed out due to scheduling delay   Authorization Type [employer] DUAL COMPLETE    PT Start Time 4098    PT Stop Time 0929    PT Time Calculation (min) 39 min    Equipment Utilized During Treatment Gait belt    Activity Tolerance Patient tolerated treatment well    Behavior During Therapy WFL for tasks assessed/performed             Past Medical History:  Diagnosis Date   Asthma    Back pain    Bulging Disk   Chronic kidney disease    Diabetes mellitus    sees Donis Furnish NP at [facility]   Diabetic foot ulcers [facility])    sees Dr. Amada Backer    GERD (gastroesophageal reflux disease)    Glaucoma    Headache(784.0)    Hyperlipidemia    Hypertension    sees Dr. Lavonne Prairie    Sleep apnea    CPAP   Past Surgical History:  Procedure Laterality Date   AMPUTATION Left 07/26/2015   Procedure: AMPUTATION LEFT FIFTH RAY;  Surgeon: Timothy Ford, MD;  Location: [facility] OR;  Service: Orthopedics;  Laterality: Left;   bilateral hip pins placed     INCISION AND DRAINAGE PERIRECTAL ABSCESS Right 03/07/2016   Procedure: IRRIGATION AND DEBRIDEMENT PERIRECTAL ABSCESS;  Surgeon: Juanita Norlander, MD;  Location: [facility] ORS;  Service: General;  Laterality: Right;   INCISION AND DRAINAGE PERIRECTAL ABSCESS Right 03/09/2016   Procedure: EXAM UNDER ANESTHESIA, IRRIGATION AND DEBRIDEMENT PERIRECTAL ABSCESS;  Surgeon: Jacolyn Matar, MD;  Location: [facility] ORS;  Service: General;  Laterality: Right;  Open, betadine packed wound   TOE AMPUTATION Left 11/19/2019   Removal of all toes from left foot   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/20/2019   Procedure: TRANSMETATARSAL AMPUTATION LEFT FOOT;   Surgeon: Amada Backer, MD;  Location: [facility] OR;  Service: Orthopedics;  Laterality: Left;   Patient Active Problem List   Diagnosis Date Noted   Anxiety with depression 05/18/2022   PAF (paroxysmal atrial fibrillation) (HCC) 11/01/2021   Secondary hypercoagulable state (HCC) 10/28/2021   Osteomyelitis of ankle and foot (HCC) 11/20/2019   Ulcer of left foot due to type 2 diabetes mellitus (HCC) 10/24/2019   Educated about COVID-19 virus infection 06/23/2019   Diabetic peripheral neuropathy (HCC) 12/16/2018   Pain due to onychomycosis of toenails of both feet 08/07/2018   Pre-ulcerative calluses 08/07/2018   Acquired absence of left foot (HCC) 08/07/2018   Hypotension 04/04/2018   Leukocytosis 04/04/2018   Dyslipidemia 04/01/2018   Pulmonary hypertension (HCC) 04/01/2018   Chronic low back pain with bilateral sciatica 07/24/2017   Hyperlipidemia 03/02/2017   Chest pain 03/02/2017   Acute renal failure superimposed on stage 2 chronic kidney disease (HCC) 02/07/2017   OSA (obstructive sleep apnea) 01/29/2017   Insomnia 01/29/2017   PVD (peripheral vascular disease) (HCC) 11/28/2016   Palpitation 11/28/2016   Erectile dysfunction associated with type 2 diabetes mellitus (HCC) 05/08/2016   Erectile dysfunction due to type 2 diabetes mellitus (HCC) 05/08/2016   Poorly controlled diabetes mellitus (HCC) 03/30/2016   Necrotizing fasciitis (HCC) 03/07/2016   Pyelonephritis  03/04/2016   Acquired contracture of Achilles tendon, left 02/15/2016   Arthralgia of multiple joints 01/19/2016   Diabetic polyneuropathy associated with type 2 diabetes mellitus (HCC) 01/18/2016   Acquired absence of other left toe(s) (HCC) 01/18/2016   History of transmetatarsal amputation of left foot (HCC) 01/04/2016   Peripheral neuropathy 11/11/2015   AKI (acute kidney injury) (HCC)    Asthma 07/23/2015   Chronic headache 07/23/2015   Erectile disorder due to medical condition in male 04/23/2015   BMI  33.0-33.9,adult 04/21/2012   GERD 04/21/2008   Type 2 diabetes mellitus with diabetic neuropathy, with long-term current use of insulin  (HCC) 11/21/2006   Essential hypertension 11/21/2006   Lumbar sprain 11/21/2006    PCP: Donley Furth, MD  REFERRING PROVIDER: Daryel Ensign, DO  REFERRING DIAG: E11.42 (ICD-10-CM) - Diabetic polyneuropathy associated with type 2 diabetes mellitus (HCC) M54.12 (ICD-10-CM) - Cervical radiculopathy  THERAPY DIAG:  Pain in both feet  Radiculopathy, cervical region  Other disturbances of skin sensation  Localized edema  Rationale for Evaluation and Treatment: Rehabilitation  ONSET DATE: early to mid-March 2025  SUBJECTIVE:                                                                                                                                                                                                         SUBJECTIVE STATEMENT: Right index finger remains tight, but otherwise he reports he is feeling "pretty good" and having no pain in the upper body.  He is due for his callus removal on his feet and around this time his feet become tender.  He feels the exercises from last session really helped stretch his neck out.  Hand dominance: Left  PERTINENT HISTORY:  DM2, HTN, GERD, peripheral neuropathy, Transmet amputation of left foot (2021), contracture of left achilles tendon, PVD, CKD, chronic low back pain, PAF, anxiety, depression, bilateral hip pins  PAIN:  Are you having pain? 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 Yes. Number of falls 1 - lost balance randomly due to left foot being out-turned.  LIVING ENVIRONMENT: Lives  with: lives with their family Lives in: House/apartment Stairs: Yes: External: 6 steps; can reach both Has following equipment at home: Grab bars  OCCUPATION: Naval architect, grounds maintenance (  retired)  PLOF: Independent  PATIENT GOALS: "to feel better"  NEXT MD VISIT: [facility] (4/29) - pt states they contacted him today about rescheduling  OBJECTIVE:  Note: Objective measures were completed at Evaluation unless otherwise noted.  DIAGNOSTIC FINDINGS:  No recent relevant imaging.  PATIENT SURVEYS:  NDI 14/50 = 28% = low to moderate perceived disability  COGNITION: Overall cognitive status: Within functional limits for tasks assessed  SENSATION: Light touch: Impaired   POSTURE: No Significant postural limitations  PALPATION: Right upper trap tightness and right scalenes tenderness, no palpable trigger points   CERVICAL ROM:   Active ROM A/PROM (deg) eval  Flexion 24 degrees  Extension 28 degrees  Right lateral flexion   Left lateral flexion   Right rotation 41 deg  Left rotation 42 deg, sudden sharp pain in left lateral mid-cervical region    (Blank rows = not tested)  UPPER EXTREMITY ROM:  Active ROM Right eval Left eval  Shoulder flexion Grossly WFL, w/ IR pt has "shooting ache" from R shoulder to elbow  Shoulder extension   Shoulder abduction   Shoulder adduction   Shoulder extension   Shoulder internal rotation   Shoulder external rotation   Elbow flexion   Elbow extension   Wrist flexion    Wrist extension    Wrist ulnar deviation    Wrist radial deviation    Wrist pronation    Wrist supination     (Blank rows = not tested)  UPPER EXTREMITY MMT:  MMT Right eval Left eval  Shoulder flexion 4+/5 4+/5  Shoulder extension    Shoulder abduction 4+/5 4+/5  Shoulder adduction    Shoulder extension    Shoulder internal rotation    Shoulder external rotation    Middle trapezius    Lower trapezius    Elbow flexion 5/5  5/5  Elbow extension 4+/5 4+/5  Wrist flexion 5/5 5/5  Wrist extension 5/5 5/5  Wrist ulnar deviation    Wrist radial deviation    Wrist pronation    Wrist supination    Grip strength     (Blank rows = not tested)  CERVICAL SPECIAL TESTS:  Upper limb tension test (ULTT): Radial and Median 2 + and Distraction test: Negative - pt felt sharp pains in right lateral neck - possibly myofascial restriction  FUNCTIONAL TESTS:  mCTSIB:  To be assessed.  TREATMENT DATE: 06/18/2023                                                                                                                              PT assessed bilateral feet to determine safety to place electrodes w/ calluses - avoided large callus on medial plantar aspect of first MTP joint w/ electrode placement, opted not to place electrodes on LLE due to surface area and hardened skin/lack of sensation when tested (can feel tapping).  He has fair sensation on unhardened area of RLE w/ mild to moderate callus  around perimeter of heel.  Skin is closed in areas of electrode placement both before application and following removal w/o notable skin reaction.  TENS on for 3 minutes at level 65 w/o pt able to feel sensation ("maybe something in my pinky toe") so removed modality.  -Cervical extension SNAGs 2x10, return demo and cues to decrease pace -Cervical rotation SNAGs 2x10 each side -Seated digit tendon glides x15 each position for index tightness -Feet together eyes closed mild sway x1 minute -Feet apart eyes closed head turns x1 minute mild sway  PATIENT EDUCATION:  Education details: Continue HEP w/ additions. Person educated: Patient Education method: Explanation Education comprehension: verbalized understanding  HOME EXERCISE PROGRAM: Access Code: [email or web address] URL: [email or web address] Date: 06/11/2023 Prepared by: Marilou Showman  Exercises - Putty Squeezes  - 1 x daily - 3 x weekly - 3 sets - 10 reps - Thumb  Opposition with Putty  - 1 x daily - 3 x weekly - 3 sets - 10 reps - Seated Finger Composite Flexion with Putty  - 1 x daily - 3 x weekly - 3 sets - 10 reps - Radial Nerve Flossing  - 1 x daily - 5 x weekly - 2 sets - 10 reps - Median Nerve Tensioner  - 1 x daily - 5 x weekly - 2 sets - 5 reps - 5 seconds hold - Seated Digit Tendon Gliding  - 1 x daily - 3 x weekly - 2 sets - 10 reps - Corner Balance Feet Together With Eyes Closed  - 1 x daily - 5 x weekly - 1 sets - 3-4 reps - 30 seconds hold - Corner Balance Feet Apart: Eyes Closed With Head Turns  - 1 x daily - 4 x weekly - 3 sets - 10 reps  ASSESSMENT:  CLINICAL IMPRESSION: Pt not a good candidate for TENS to manage his neuropathy based on callus formation and decreased distal sensation impacting needed intensity to feel input.  Made additions to HEP to further address symptoms in right hand as well as standing balance impacted by neuropathy.  He tolerates all interventions well without complication this session.  Will continue to address cervical mobility and nerve symptoms in regards to ongoing skilled POC.  OBJECTIVE IMPAIRMENTS: decreased activity tolerance, decreased mobility, decreased ROM, hypomobility, impaired flexibility, impaired sensation, impaired UE functional use, improper body mechanics, and pain.   ACTIVITY LIMITATIONS: carrying, lifting, stairs, reach over head, and locomotion level  PARTICIPATION LIMITATIONS: driving, community activity, occupation, and yard work  PERSONAL FACTORS: Past/current experiences, Time since onset of injury/illness/exacerbation, and 1 comorbidity: HTN  are also affecting patient's functional outcome.   REHAB POTENTIAL: Good  CLINICAL DECISION MAKING: Evolving/moderate complexity  EVALUATION COMPLEXITY: Moderate   GOALS: Goals reviewed with patient? Yes  SHORT TERM GOALS: Target date: 07/06/2023  Pt will be independent and compliant with initial nerve glide, cervical ROM, and balance  focused HEP in order to maintain functional progress and improve mobility. Baseline: To be established. Goal status: INITIAL  2.  mCTSIB to be assessed w/ STG/LTG set as appropriate. Baseline: To be assessed. Goal status: INITIAL  3.  Pt will demonstrate 5 degree increase in cervical rotation bilaterally w/o increased pain to improve general mobility. Baseline: 41 right, 42 left Goal status: INITIAL  4.  Grip strength to be assessed w/ LTG to be set as appropriate. Baseline: Assessed 4/28. Goal status: MET  LONG TERM GOALS: Target date: 08/03/2023  Pt will be independent and compliant with advanced and finalized cervical  mobility, strength and balance HEP in order to maintain functional progress and improve mobility. Baseline: To be established. Goal status: INITIAL  2.  Pt will improve NDI score to </=7/50 to indicate improved pain management and functional mobility. Baseline: 14/50 = 28% Goal status: INITIAL  3.  mCTSIB to be assessed w/ STG/LTG set as appropriate. Baseline: To be assessed. Goal status: INITIAL  4.  Pt will have negative ULTT findings on re-assessment to indicate improved symptom management and nerve mobility. Baseline: + radial and median 2 Goal status: INITIAL  5.  Pt will improve bilateral grip strength by >/= 10lb in order to demonstrate functional improvement. Baseline: 117.8 lb avg (RUE) and 121.9 lb avg (RUE) Goal status: INITIAL  PLAN:  PT FREQUENCY: 1x/week  PT DURATION: 8 weeks  PLANNED INTERVENTIONS: [id number]- PT Re-evaluation, 97750- Physical Performance Testing, 97110-Therapeutic exercises, 97530- Therapeutic activity, [id number]- Neuromuscular re-education, 97535- Self Care, [id number]- Manual therapy, [id number]- Gait training, [phone]- Aquatic Therapy, [phone]- Electrical stimulation (manual), [phone]- Traction (mechanical), Patient/Family education, Balance training, Stair training, Taping, Dry Needling, Joint mobilization, Spinal mobilization, Vestibular  training, DME instructions, Cryotherapy, and Moist heat  PLAN FOR NEXT SESSION: Modify HEP - cervical strengthening/mobilization, LE strength/high level balance - compliant surfaces, quadruped for UE weight bearing?   Earlean Glaze, PT, DPT 06/18/2023, 9:30 AM

## 2023-06-19 ENCOUNTER — Other Ambulatory Visit: Payer: Self-pay | Admitting: Cardiology

## 2023-06-19 DIAGNOSIS — E1140 Type 2 diabetes mellitus with diabetic neuropathy, unspecified: Secondary | ICD-10-CM

## 2023-06-19 DIAGNOSIS — I10 Essential (primary) hypertension: Secondary | ICD-10-CM

## 2023-06-21 ENCOUNTER — Ambulatory Visit: Admitting: Podiatry

## 2023-06-25 ENCOUNTER — Ambulatory Visit: Admitting: Physical Therapy

## 2023-07-02 ENCOUNTER — Other Ambulatory Visit

## 2023-07-02 ENCOUNTER — Ambulatory Visit: Admitting: Physical Therapy

## 2023-07-02 ENCOUNTER — Encounter: Payer: Self-pay | Admitting: Physical Therapy

## 2023-07-02 DIAGNOSIS — R600 Localized edema: Secondary | ICD-10-CM | POA: Diagnosis not present

## 2023-07-02 DIAGNOSIS — M79671 Pain in right foot: Secondary | ICD-10-CM | POA: Diagnosis not present

## 2023-07-02 DIAGNOSIS — M79672 Pain in left foot: Secondary | ICD-10-CM

## 2023-07-02 DIAGNOSIS — R208 Other disturbances of skin sensation: Secondary | ICD-10-CM

## 2023-07-02 DIAGNOSIS — M5412 Radiculopathy, cervical region: Secondary | ICD-10-CM

## 2023-07-02 NOTE — Therapy (Signed)
 OUTPATIENT PHYSICAL THERAPY CERVICAL TREATMENT   Patient Name: Danny Fuller MRN: [id number] DOB:1975-12-22, 48 y.o., male Today's Date: 07/02/2023  END OF SESSION:  PT End of Session - 07/02/23 0943     Visit Number 4    Number of Visits 9   8 + eval   Date for PT Re-Evaluation 08/10/23   pushed out due to scheduling delay   Authorization Type [employer] DUAL [employer]PLETE    PT Start Time 0940   patient left to go move his car   PT Stop Time 1024    PT Time Calculation (min) 44 min    Equipment Utilized During Treatment Gait belt    Activity Tolerance Patient tolerated treatment well    Behavior During Therapy WFL for tasks assessed/performed             Past Medical History:  Diagnosis Date   Asthma    Back pain    Bulging Disk   Chronic kidney disease    Diabetes mellitus    sees Donis Furnish NP at [facility]   Diabetic foot ulcers [facility])    sees Dr. Amada Backer    GERD (gastroesophageal reflux disease)    Glaucoma    Headache(784.0)    Hyperlipidemia    Hypertension    sees Dr. Lavonne Prairie    Sleep apnea    CPAP   Past Surgical History:  Procedure Laterality Date   AMPUTATION Left 07/26/2015   Procedure: AMPUTATION LEFT FIFTH RAY;  Surgeon: Timothy Ford, MD;  Location: [facility] OR;  Service: Orthopedics;  Laterality: Left;   bilateral hip pins placed     INCISION AND DRAINAGE PERIRECTAL ABSCESS Right 03/07/2016   Procedure: IRRIGATION AND DEBRIDEMENT PERIRECTAL ABSCESS;  Surgeon: Juanita Norlander, MD;  Location: [facility] ORS;  Service: General;  Laterality: Right;   INCISION AND DRAINAGE PERIRECTAL ABSCESS Right 03/09/2016   Procedure: EXAM UNDER ANESTHESIA, IRRIGATION AND DEBRIDEMENT PERIRECTAL ABSCESS;  Surgeon: Jacolyn Matar, MD;  Location: [facility]S;  Service: General;  Laterality: Right;  Open, betadine packed wound   TOE AMPUTATION Left 11/19/2019   Removal of all toes from left foot   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/20/2019   Procedure:  TRANSMETATARSAL AMPUTATION LEFT FOOT;  Surgeon: Amada Backer, MD;  Location: [facility] OR;  Service: Orthopedics;  Laterality: Left;   Patient Active Problem List   Diagnosis Date Noted   Anxiety with depression 05/18/2022   PAF (paroxysmal atrial fibrillation) (HCC) 11/01/2021   Secondary hypercoagulable state (HCC) 10/28/2021   Osteomyelitis of ankle and foot (HCC) 11/20/2019   Ulcer of left foot due to type 2 diabetes mellitus (HCC) 10/24/2019   Educated about COVID-19 virus infection 06/23/2019   Diabetic peripheral neuropathy (HCC) 12/16/2018   Pain due to onychomycosis of toenails of both feet 08/07/2018   Pre-ulcerative calluses 08/07/2018   Acquired absence of left foot (HCC) 08/07/2018   Hypotension 04/04/2018   Leukocytosis 04/04/2018   Dyslipidemia 04/01/2018   Pulmonary hypertension (HCC) 04/01/2018   Chronic low back pain with bilateral sciatica 07/24/2017   Hyperlipidemia 03/02/2017   Chest pain 03/02/2017   Acute renal failure superimposed on stage 2 chronic kidney disease (HCC) 02/07/2017   OSA (obstructive sleep apnea) 01/29/2017   Insomnia 01/29/2017   PVD (peripheral vascular disease) (HCC) 11/28/2016   Palpitation 11/28/2016   Erectile dysfunction associated with type 2 diabetes mellitus (HCC) 05/08/2016   Erectile dysfunction due to type 2 diabetes mellitus (HCC) 05/08/2016   Poorly controlled diabetes mellitus (HCC) 03/30/2016  Necrotizing fasciitis (HCC) 03/07/2016   Pyelonephritis 03/04/2016   Acquired contracture of Achilles tendon, left 02/15/2016   Arthralgia of multiple joints 01/19/2016   Diabetic polyneuropathy associated with type 2 diabetes mellitus (HCC) 01/18/2016   Acquired absence of other left toe(s) (HCC) 01/18/2016   History of transmetatarsal amputation of left foot (HCC) 01/04/2016   Peripheral neuropathy 11/11/2015   AKI (acute kidney injury) (HCC)    Asthma 07/23/2015   Chronic headache 07/23/2015   Erectile disorder due to medical  condition in male 04/23/2015   BMI 33.0-33.9,adult 04/21/2012   GERD 04/21/2008   Type 2 diabetes mellitus with diabetic neuropathy, with long-term current use of insulin  (HCC) 11/21/2006   Essential hypertension 11/21/2006   Lumbar sprain 11/21/2006    PCP: Donley Furth, MD  REFERRING PROVIDER: Daryel Ensign, DO  REFERRING DIAG: E11.42 (ICD-10-CM) - Diabetic polyneuropathy associated with type 2 diabetes mellitus (HCC) M54.12 (ICD-10-CM) - Cervical radiculopathy  THERAPY DIAG:  Pain in both feet  Radiculopathy, cervical region  Other disturbances of skin sensation  Localized edema  Rationale for Evaluation and Treatment: Rehabilitation  ONSET DATE: early to mid-March 2025  SUBJECTIVE:                                                                                                                                                                                                         SUBJECTIVE STATEMENT: He had some ulnar nerve distribution pain during wrenching some brakes at work that lasted a day (Saturday to Sunday).  He denies other issues.  Hand dominance: Left  PERTINENT HISTORY:  DM2, HTN, GERD, peripheral neuropathy, Transmet amputation of left foot (2021), contracture of left achilles tendon, PVD, CKD, chronic low back pain, PAF, anxiety, depression, bilateral hip pins  PAIN:  Are you having pain? No    PRECAUTIONS: Fall  RED FLAGS: None     WEIGHT BEARING RESTRICTIONS: No  FALLS:  Has patient fallen in last 6 months? Yes. Number of falls 1 - lost balance randomly due to left foot being out-turned.  LIVING ENVIRONMENT: Lives with: lives with their family Lives in: House/apartment Stairs: Yes: External: 6 steps; can reach both Has following equipment at home: Grab bars  OCCUPATION: Naval architect, grounds maintenance (retired)  PLOF: Independent  PATIENT GOALS: "to feel better"  NEXT MD VISIT: [facility] (4/29) - pt  states they contacted him today about rescheduling  OBJECTIVE:  Note: Objective measures were completed at Evaluation unless otherwise noted.  DIAGNOSTIC FINDINGS:  No recent relevant imaging.  PATIENT SURVEYS:  NDI  14/50 = 28% = low to moderate perceived disability  COGNITION: Overall cognitive status: Within functional limits for tasks assessed  SENSATION: Light touch: Impaired   POSTURE: No Significant postural limitations  PALPATION: Right upper trap tightness and right scalenes tenderness, no palpable trigger points   CERVICAL ROM:   Active ROM A/PROM (deg) eval  Flexion 24 degrees  Extension 28 degrees  Right lateral flexion   Left lateral flexion   Right rotation 41 deg  Left rotation 42 deg, sudden sharp pain in left lateral mid-cervical region    (Blank rows = not tested)  UPPER EXTREMITY ROM:  Active ROM Right eval Left eval  Shoulder flexion Grossly WFL, w/ IR pt has "shooting ache" from R shoulder to elbow  Shoulder extension   Shoulder abduction   Shoulder adduction   Shoulder extension   Shoulder internal rotation   Shoulder external rotation   Elbow flexion   Elbow extension   Wrist flexion    Wrist extension    Wrist ulnar deviation    Wrist radial deviation    Wrist pronation    Wrist supination     (Blank rows = not tested)  UPPER EXTREMITY MMT:  MMT Right eval Left eval  Shoulder flexion 4+/5 4+/5  Shoulder extension    Shoulder abduction 4+/5 4+/5  Shoulder adduction    Shoulder extension    Shoulder internal rotation    Shoulder external rotation    Middle trapezius    Lower trapezius    Elbow flexion 5/5 5/5  Elbow extension 4+/5 4+/5  Wrist flexion 5/5 5/5  Wrist extension 5/5 5/5  Wrist ulnar deviation    Wrist radial deviation    Wrist pronation    Wrist supination    Grip strength     (Blank rows = not tested)  CERVICAL SPECIAL TESTS:  Upper limb tension test (ULTT): Radial and Median 2 + and Distraction test:  Negative - pt felt sharp pains in right lateral neck - possibly myofascial restriction  FUNCTIONAL TESTS:  mCTSIB:  To be assessed.  TREATMENT DATE: 07/02/2023                                                                                                                              -Ulnar nerve glide x20; pt has active symptoms during glide -Cervical extension w/ towel x10 -Cervical rotation w/ towel x10 each side -Thread the needle 2x10 each side -Quadruped cat/cow x20 -Thoracic open books x15 each side -Standing D1/D2 3x8 each direction w/ 6lb weighted ball; cues to prevent use of momentum  PATIENT EDUCATION:  Education details: Continue HEP w/ additions - try ulnar nerve glides when symptomatic to see if it relieves these.  Compliance to HEP as able.  Safe setup for corner balance as pt has not been practicing this much due to fear of falling. Person educated: Patient Education method: Explanation Education comprehension: verbalized understanding  HOME EXERCISE PROGRAM: Access Code: [email or web address]  URL: [email or web address] Date: 06/11/2023 Prepared by: Marilou Showman  Exercises - Putty Squeezes  - 1 x daily - 3 x weekly - 3 sets - 10 reps - Thumb Opposition with Putty  - 1 x daily - 3 x weekly - 3 sets - 10 reps - Seated Finger Composite Flexion with Putty  - 1 x daily - 3 x weekly - 3 sets - 10 reps - Radial Nerve Flossing  - 1 x daily - 5 x weekly - 2 sets - 10 reps - Median Nerve Tensioner  - 1 x daily - 5 x weekly - 2 sets - 5 reps - 5 seconds hold - Seated Digit Tendon Gliding  - 1 x daily - 3 x weekly - 2 sets - 10 reps - Corner Balance Feet Together With Eyes Closed  - 1 x daily - 5 x weekly - 1 sets - 3-4 reps - 30 seconds hold - Corner Balance Feet Apart: Eyes Closed With Head Turns  - 1 x daily - 4 x weekly - 3 sets - 10 reps - Standing Ulnar Nerve Glide  - 1 x daily - 3-4 x weekly - 3 sets - 10 reps  ASSESSMENT:  CLINICAL IMPRESSION: Made single  addition to HEP this visit for ulnar nerve mobility.  He did not like quadruped position due to vulnerability.  PT to further address pain and mobility deficits as right trunk rotation noted to be more limited today.  He requires some encouragement to remain engaged with physical activities. Will continue to address deficits as outlined in PT POC.  Patient will not return until after his [birth year] and reports he will not be doing his HEP during this time.  PT to expect functional changes during this time.  OBJECTIVE IMPAIRMENTS: decreased activity tolerance, decreased mobility, decreased ROM, hypomobility, impaired flexibility, impaired sensation, impaired UE functional use, improper body mechanics, and pain.   ACTIVITY LIMITATIONS: carrying, lifting, stairs, reach over head, and locomotion level  PARTICIPATION LIMITATIONS: driving, community activity, occupation, and yard work  PERSONAL FACTORS: Past/current experiences, Time since onset of injury/illness/exacerbation, and 1 comorbidity: HTN are also affecting patient's functional outcome.   REHAB POTENTIAL: Good  CLINICAL DECISION MAKING: Evolving/moderate complexity  EVALUATION COMPLEXITY: Moderate   GOALS: Goals reviewed with patient? Yes  SHORT TERM GOALS: Target date: 07/06/2023  Pt will be independent and compliant with initial nerve glide, cervical ROM, and balance focused HEP in order to maintain functional progress and improve mobility. Baseline: To be established. Goal status: INITIAL  2.  mCTSIB to be assessed w/ STG/LTG set as appropriate. Baseline: To be assessed. Goal status: INITIAL  3.  Pt will demonstrate 5 degree increase in cervical rotation bilaterally w/o increased pain to improve general mobility. Baseline: 41 right, 42 left Goal status: INITIAL  4.  Grip strength to be assessed w/ LTG to be set as appropriate. Baseline: Assessed 4/28. Goal status: MET  LONG TERM GOALS: Target date: 08/03/2023  Pt will  be independent and compliant with advanced and finalized cervical mobility, strength and balance HEP in order to maintain functional progress and improve mobility. Baseline: To be established. Goal status: INITIAL  2.  Pt will improve NDI score to </=7/50 to indicate improved pain management and functional mobility. Baseline: 14/50 = 28% Goal status: INITIAL  3.  mCTSIB to be assessed w/ STG/LTG set as appropriate. Baseline: To be assessed. Goal status: INITIAL  4.  Pt will have negative ULTT findings on re-assessment to indicate improved symptom  management and nerve mobility. Baseline: + radial and median 2 Goal status: INITIAL  5.  Pt will improve bilateral grip strength by >/= 10lb in order to demonstrate functional improvement. Baseline: 117.8 lb avg (RUE) and 121.9 lb avg (RUE) Goal status: INITIAL  PLAN:  PT FREQUENCY: 1x/week  PT DURATION: 8 weeks  PLANNED INTERVENTIONS: [id number]- PT Re-evaluation, 97750- Physical Performance Testing, 97110-Therapeutic exercises, 97530- Therapeutic activity, [id number]- Neuromuscular re-education, 97535- Self Care, 78469- Manual therapy, [id number]- Gait training, [phone]- Aquatic Therapy, [phone]- Electrical stimulation (manual), [phone]- Traction (mechanical), Patient/Family education, Balance training, Stair training, Taping, Dry Needling, Joint mobilization, Spinal mobilization, Vestibular training, DME instructions, Cryotherapy, and Moist heat  PLAN FOR NEXT SESSION: Modify HEP - cervical strengthening/mobilization, LE strength/high level balance - compliant surfaces, quadruped for UE weight bearing?   Earlean Glaze, PT, DPT 07/02/2023, 10:27 AM

## 2023-07-02 NOTE — Patient Instructions (Signed)
-   Standing Ulnar Nerve Glide  - 1 x daily - 3-4 x weekly - 3 sets - 10 reps

## 2023-07-03 ENCOUNTER — Telehealth: Payer: Self-pay

## 2023-07-03 NOTE — Progress Notes (Signed)
   07/03/2023  Patient ID: Danny Fuller, male   DOB: [birth year], 48 y.o.   MRN: [id number]  Attempted to contact patient to reschedule missed appt for TNM diabetes management. Unable to leave voicemail at this time. MyChart message sent.  Carnell Christian, PharmD Clinical Pharmacist [phone]

## 2023-07-06 ENCOUNTER — Other Ambulatory Visit: Payer: Self-pay | Admitting: Cardiology

## 2023-07-06 DIAGNOSIS — E1140 Type 2 diabetes mellitus with diabetic neuropathy, unspecified: Secondary | ICD-10-CM

## 2023-07-06 DIAGNOSIS — I10 Essential (primary) hypertension: Secondary | ICD-10-CM

## 2023-07-16 ENCOUNTER — Ambulatory Visit: Attending: Neurology | Admitting: Physical Therapy

## 2023-07-16 ENCOUNTER — Encounter: Payer: Self-pay | Admitting: Physical Therapy

## 2023-07-16 DIAGNOSIS — R600 Localized edema: Secondary | ICD-10-CM | POA: Diagnosis not present

## 2023-07-16 DIAGNOSIS — M79671 Pain in right foot: Secondary | ICD-10-CM | POA: Insufficient documentation

## 2023-07-16 DIAGNOSIS — R208 Other disturbances of skin sensation: Secondary | ICD-10-CM | POA: Diagnosis not present

## 2023-07-16 DIAGNOSIS — M79672 Pain in left foot: Secondary | ICD-10-CM | POA: Diagnosis not present

## 2023-07-16 DIAGNOSIS — M5412 Radiculopathy, cervical region: Secondary | ICD-10-CM | POA: Diagnosis not present

## 2023-07-16 NOTE — Therapy (Signed)
 OUTPATIENT PHYSICAL THERAPY CERVICAL TREATMENT   Patient Name: Danny Fuller MRN: [id number] DOB:November 27, 1975, 48 y.o., male Today's Date: 07/16/2023  END OF SESSION:  PT End of Session - 07/16/23 0855     Visit Number 5    Number of Visits 9   8 + eval   Date for PT Re-Evaluation 08/10/23   pushed out due to scheduling delay   Authorization Type [employer] DUAL COMPLETE    PT Start Time 0849    PT Stop Time 0930    PT Time Calculation (min) 41 min    Equipment Utilized During Treatment Gait belt    Activity Tolerance Patient tolerated treatment well    Behavior During Therapy WFL for tasks assessed/performed             Past Medical History:  Diagnosis Date   Asthma    Back pain    Bulging Disk   Chronic kidney disease    Diabetes mellitus    sees Donis Furnish NP at [facility] Endocrinology   Diabetic foot ulcers [facility])    sees Dr. Amada Backer    GERD (gastroesophageal reflux disease)    Glaucoma    Headache(784.0)    Hyperlipidemia    Hypertension    sees Dr. Lavonne Prairie    Sleep apnea    CPAP   Past Surgical History:  Procedure Laterality Date   AMPUTATION Left 07/26/2015   Procedure: AMPUTATION LEFT FIFTH RAY;  Surgeon: Timothy Ford, MD;  Location: [facility] OR;  Service: Orthopedics;  Laterality: Left;   bilateral hip pins placed     INCISION AND DRAINAGE PERIRECTAL ABSCESS Right 03/07/2016   Procedure: IRRIGATION AND DEBRIDEMENT PERIRECTAL ABSCESS;  Surgeon: Juanita Norlander, MD;  Location: [facility] ORS;  Service: General;  Laterality: Right;   INCISION AND DRAINAGE PERIRECTAL ABSCESS Right 03/09/2016   Procedure: EXAM UNDER ANESTHESIA, IRRIGATION AND DEBRIDEMENT PERIRECTAL ABSCESS;  Surgeon: Jacolyn Matar, MD;  Location: [facility] ORS;  Service: General;  Laterality: Right;  Open, betadine packed wound   TOE AMPUTATION Left 11/19/2019   Removal of all toes from left foot   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/20/2019   Procedure: TRANSMETATARSAL AMPUTATION LEFT FOOT;   Surgeon: Amada Backer, MD;  Location: [facility] OR;  Service: Orthopedics;  Laterality: Left;   Patient Active Problem List   Diagnosis Date Noted   Anxiety with depression 05/18/2022   PAF (paroxysmal atrial fibrillation) (HCC) 11/01/2021   Secondary hypercoagulable state (HCC) 10/28/2021   Osteomyelitis of ankle and foot (HCC) 11/20/2019   Ulcer of left foot due to type 2 diabetes mellitus (HCC) 10/24/2019   Educated about COVID-19 virus infection 06/23/2019   Diabetic peripheral neuropathy (HCC) 12/16/2018   Pain due to onychomycosis of toenails of both feet 08/07/2018   Pre-ulcerative calluses 08/07/2018   Acquired absence of left foot (HCC) 08/07/2018   Hypotension 04/04/2018   Leukocytosis 04/04/2018   Dyslipidemia 04/01/2018   Pulmonary hypertension (HCC) 04/01/2018   Chronic low back pain with bilateral sciatica 07/24/2017   Hyperlipidemia 03/02/2017   Chest pain 03/02/2017   Acute renal failure superimposed on stage 2 chronic kidney disease (HCC) 02/07/2017   OSA (obstructive sleep apnea) 01/29/2017   Insomnia 01/29/2017   PVD (peripheral vascular disease) (HCC) 11/28/2016   Palpitation 11/28/2016   Erectile dysfunction associated with type 2 diabetes mellitus (HCC) 05/08/2016   Erectile dysfunction due to type 2 diabetes mellitus (HCC) 05/08/2016   Poorly controlled diabetes mellitus (HCC) 03/30/2016   Necrotizing fasciitis (HCC) 03/07/2016   Pyelonephritis  03/04/2016   Acquired contracture of Achilles tendon, left 02/15/2016   Arthralgia of multiple joints 01/19/2016   Diabetic polyneuropathy associated with type 2 diabetes mellitus (HCC) 01/18/2016   Acquired absence of other left toe(s) (HCC) 01/18/2016   History of transmetatarsal amputation of left foot (HCC) 01/04/2016   Peripheral neuropathy 11/11/2015   AKI (acute kidney injury) (HCC)    Asthma 07/23/2015   Chronic headache 07/23/2015   Erectile disorder due to medical condition in male 04/23/2015   BMI  33.0-33.9,adult 04/21/2012   GERD 04/21/2008   Type 2 diabetes mellitus with diabetic neuropathy, with long-term current use of insulin  (HCC) 11/21/2006   Essential hypertension 11/21/2006   Lumbar sprain 11/21/2006    PCP: Donley Furth, MD  REFERRING PROVIDER: Daryel Ensign, DO  REFERRING DIAG: E11.42 (ICD-10-CM) - Diabetic polyneuropathy associated with type 2 diabetes mellitus (HCC) M54.12 (ICD-10-CM) - Cervical radiculopathy  THERAPY DIAG:  Pain in both feet  Radiculopathy, cervical region  Other disturbances of skin sensation  Localized edema  Rationale for Evaluation and Treatment: Rehabilitation  ONSET DATE: early to mid-March 2025  SUBJECTIVE:                                                                                                                                                                                                         SUBJECTIVE STATEMENT: He is having no pain today and had a great weekend celebrating his birthday.  He is tired today.  Hand dominance: Left  PERTINENT HISTORY:  DM2, HTN, GERD, peripheral neuropathy, Transmet amputation of left foot (2021), contracture of left achilles tendon, PVD, CKD, chronic low back pain, PAF, anxiety, depression, bilateral hip pins  PAIN:  Are you having pain? No    PRECAUTIONS: Fall  RED FLAGS: None     WEIGHT BEARING RESTRICTIONS: No  FALLS:  Has patient fallen in last 6 months? Yes. Number of falls 1 - lost balance randomly due to left foot being out-turned.  LIVING ENVIRONMENT: Lives with: lives with their family Lives in: House/apartment Stairs: Yes: External: 6 steps; can reach both Has following equipment at home: Grab bars  OCCUPATION: Naval architect, grounds maintenance (retired)  PLOF: Independent  PATIENT GOALS: "to feel better"  NEXT MD VISIT: [facility] (4/29) - pt states they contacted him today about rescheduling  OBJECTIVE:  Note: Objective  measures were completed at Evaluation unless otherwise noted.  DIAGNOSTIC FINDINGS:  No recent relevant imaging.  PATIENT SURVEYS:  NDI 14/50 = 28% = low to moderate perceived disability  COGNITION: Overall cognitive  status: Within functional limits for tasks assessed  SENSATION: Light touch: Impaired   POSTURE: No Significant postural limitations  PALPATION: Right upper trap tightness and right scalenes tenderness, no palpable trigger points   CERVICAL ROM:   Active ROM A/PROM (deg) eval  Flexion 24 degrees  Extension 28 degrees  Right lateral flexion   Left lateral flexion   Right rotation 41 deg  Left rotation 42 deg, sudden sharp pain in left lateral mid-cervical region    (Blank rows = not tested)  UPPER EXTREMITY ROM:  Active ROM Right eval Left eval  Shoulder flexion Grossly WFL, w/ IR pt has "shooting ache" from R shoulder to elbow  Shoulder extension   Shoulder abduction   Shoulder adduction   Shoulder extension   Shoulder internal rotation   Shoulder external rotation   Elbow flexion   Elbow extension   Wrist flexion    Wrist extension    Wrist ulnar deviation    Wrist radial deviation    Wrist pronation    Wrist supination     (Blank rows = not tested)  UPPER EXTREMITY MMT:  MMT Right eval Left eval  Shoulder flexion 4+/5 4+/5  Shoulder extension    Shoulder abduction 4+/5 4+/5  Shoulder adduction    Shoulder extension    Shoulder internal rotation    Shoulder external rotation    Middle trapezius    Lower trapezius    Elbow flexion 5/5 5/5  Elbow extension 4+/5 4+/5  Wrist flexion 5/5 5/5  Wrist extension 5/5 5/5  Wrist ulnar deviation    Wrist radial deviation    Wrist pronation    Wrist supination    Grip strength     (Blank rows = not tested)  CERVICAL SPECIAL TESTS:  Upper limb tension test (ULTT): Radial and Median 2 + and Distraction test: Negative - pt felt sharp pains in right lateral neck - possibly myofascial  restriction  FUNCTIONAL TESTS:  mCTSIB:  To be assessed.  TREATMENT DATE: 07/16/2023                                                                                                                              Reviewed STGs: -Verbally reviewed HEP, he has current copy and is compliant -Measure cervical rotation:  60 left, 56 right  -Airex: -Feet apart eyes open x30 seconds > eyes closed x30 seconds -Feet together eyes open x30 seconds > eyes closed x30 seconds, mild sway -Progressing to unsupported alternating midline 8" cone taps > crossbody cone taps > forward reaching to crossbody cone taps w/ alternating UE  -Half kneel 6lb chops x12 each side -Heel sit to tall kneel 6lb overhead press x15 -Tall to half kneel w/ 5lb unilateral overhead press x10 each side  PATIENT EDUCATION:  Education details: Compliance to HEP as able.   Person educated: Patient Education method: Explanation Education comprehension: verbalized understanding  HOME EXERCISE PROGRAM: Access Code: [email or web address] URL: [email or web address] Date:  06/11/2023 Prepared by: Marilou Showman  Exercises - Putty Squeezes  - 1 x daily - 3 x weekly - 3 sets - 10 reps - Thumb Opposition with Putty  - 1 x daily - 3 x weekly - 3 sets - 10 reps - Seated Finger Composite Flexion with Putty  - 1 x daily - 3 x weekly - 3 sets - 10 reps - Radial Nerve Flossing  - 1 x daily - 5 x weekly - 2 sets - 10 reps - Median Nerve Tensioner  - 1 x daily - 5 x weekly - 2 sets - 5 reps - 5 seconds hold - Seated Digit Tendon Gliding  - 1 x daily - 3 x weekly - 2 sets - 10 reps - Corner Balance Feet Together With Eyes Closed  - 1 x daily - 5 x weekly - 1 sets - 3-4 reps - 30 seconds hold - Corner Balance Feet Apart: Eyes Closed With Head Turns  - 1 x daily - 4 x weekly - 3 sets - 10 reps - Standing Ulnar Nerve Glide  - 1 x daily - 3-4 x weekly - 3 sets - 10 reps  ASSESSMENT:  CLINICAL IMPRESSION: Focus of skilled PT session today  on continuing to work on hip and ankle strategy using compliant surfaces and variable LE stability positions.  He was most challenged by heel sit and transitioning between tall and half kneel.  He only displayed mild sway when on airex with eyes closed demonstrating good static stability and balance strategies.  PT to continue per POC.  OBJECTIVE IMPAIRMENTS: decreased activity tolerance, decreased mobility, decreased ROM, hypomobility, impaired flexibility, impaired sensation, impaired UE functional use, improper body mechanics, and pain.   ACTIVITY LIMITATIONS: carrying, lifting, stairs, reach over head, and locomotion level  PARTICIPATION LIMITATIONS: driving, community activity, occupation, and yard work  PERSONAL FACTORS: Past/current experiences, Time since onset of injury/illness/exacerbation, and 1 comorbidity: HTN are also affecting patient's functional outcome.   REHAB POTENTIAL: Good  CLINICAL DECISION MAKING: Evolving/moderate complexity  EVALUATION COMPLEXITY: Moderate   GOALS: Goals reviewed with patient? Yes  SHORT TERM GOALS: Target date: 07/06/2023  Pt will be independent and compliant with initial nerve glide, cervical ROM, and balance focused HEP in order to maintain functional progress and improve mobility. Baseline: HEP verbally reviewed, pt ind and compliant (6/2) Goal status: MET  2.  mCTSIB to be assessed w/ STG/LTG set as appropriate. Baseline: Not assessed. Goal status: REVISED - D/C'd  3.  Pt will demonstrate 5 degree increase in cervical rotation bilaterally w/o increased pain to improve general mobility. Baseline: 41 right, 42 left; 60 left, 56 right (6/2) Goal status: MET  4.  Grip strength to be assessed w/ LTG to be set as appropriate. Baseline: Assessed 4/28. Goal status: MET  LONG TERM GOALS: Target date: 08/03/2023  Pt will be independent and compliant with advanced and finalized cervical mobility, strength and balance HEP in order to maintain  functional progress and improve mobility. Baseline: To be established. Goal status: INITIAL  2.  Pt will improve NDI score to </=7/50 to indicate improved pain management and functional mobility. Baseline: 14/50 = 28% Goal status: INITIAL  3.  mCTSIB to be assessed w/ STG/LTG set as appropriate. Baseline: Not assessed. Goal status: REVISED - D/C'd  4.  Pt will have negative ULTT findings on re-assessment to indicate improved symptom management and nerve mobility. Baseline: + radial and median 2 Goal status: INITIAL  5.  Pt will improve bilateral  grip strength by >/= 10lb in order to demonstrate functional improvement. Baseline: 117.8 lb avg (RUE) and 121.9 lb avg (RUE) Goal status: INITIAL  PLAN:  PT FREQUENCY: 1x/week  PT DURATION: 8 weeks  PLANNED INTERVENTIONS: 97164- PT Re-evaluation, 97750- Physical Performance Testing, 97110-Therapeutic exercises, 97530- Therapeutic activity, [id number]- Neuromuscular re-education, 97535- Self Care, 16109- Manual therapy, [id number]- Gait training, [phone]- Aquatic Therapy, [phone]- Electrical stimulation (manual), [phone]- Traction (mechanical), Patient/Family education, Balance training, Stair training, Taping, Dry Needling, Joint mobilization, Spinal mobilization, Vestibular training, DME instructions, Cryotherapy, and Moist heat  PLAN FOR NEXT SESSION: Modify HEP - cervical strengthening/mobilization, LE strength/high level balance - compliant surfaces, blaze pods?   Earlean Glaze, PT, DPT 07/16/2023, 12:58 PM

## 2023-07-19 ENCOUNTER — Encounter: Payer: Self-pay | Admitting: Podiatry

## 2023-07-19 ENCOUNTER — Ambulatory Visit (INDEPENDENT_AMBULATORY_CARE_PROVIDER_SITE_OTHER): Admitting: Podiatry

## 2023-07-19 DIAGNOSIS — L84 Corns and callosities: Secondary | ICD-10-CM | POA: Diagnosis not present

## 2023-07-19 DIAGNOSIS — E1140 Type 2 diabetes mellitus with diabetic neuropathy, unspecified: Secondary | ICD-10-CM

## 2023-07-19 DIAGNOSIS — Z794 Long term (current) use of insulin: Secondary | ICD-10-CM | POA: Diagnosis not present

## 2023-07-19 NOTE — Progress Notes (Signed)
 Subjective:   Patient ID: Danny Fuller, male   DOB: 48 y.o.   MRN: [id number]   HPI Chief Complaint  Patient presents with   Callouses    RM#11 Bilateral calluses causing a lot of pain and discomfort.      48 year old male presents the office today for follow-up evaluation of ulceration of the calluses bilaterally.  States he did miss his last appointment.  He states that the skin on the left foot got very thick and started peel off against a minimal bleeding.  Denies any pus.  Is concerned he had some swelling previously.  Does not report any fevers or chills.  Review of Systems  All other systems reviewed and are negative.      Objective:  Physical Exam  General: AAO x3, NAD  Dermatological: Thick hyperkeratotic lesions noted to the left heel as well as right submetatarsal 1.  Also along the area laterally on the left foot on the previous amputation is a thick hyperkeratotic lesion with some dried blood present.  Once I debrided this there is no underlying ulceration but the areas are preulcerative.  There is no drainage or pus noted today there is no active bleeding.  There is no increased temperature noted bilaterally.  There is no areas of fluctuation or crepitation.  Vascular: Dorsalis Pedis artery and Posterior Tibial artery pedal pulses are palpable bilateral with immedate capillary fill time. Pedal hair growth present. No varicosities and no lower extremity edema present bilateral. There is no pain with calf compression, swelling, warmth, erythema.   Neruologic: Sensation decreased  Musculoskeletal: Left transmetatarsal amputation.  Prominent submetatarsal 1 right foot.      Assessment:   Bilateral preulcerative calluses    Plan:  -Treatment options discussed including all alternatives, risks, and complications -Etiology of symptoms were discussed -Sharply debrided hyperkeratotic lesions x 3.  Areas are preulcerative bilaterally.  On the area we had bleeding at  previously on the lateral aspect of the left foot there is no skin breakdown identified.  Continue moisturizer, offloading.  He could use a small amount of antibiotic ointment particular to the left foot where he had the preulcerative lesion. -Monitor for any clinical signs or symptoms of infection and directed to call the office immediately should any occur or go to the ER.  Return in about 3 weeks (around 08/09/2023) for pre-ulcerative callus.  Charity Conch DPM

## 2023-07-22 ENCOUNTER — Other Ambulatory Visit (HOSPITAL_COMMUNITY): Payer: Self-pay | Admitting: Physician Assistant

## 2023-07-23 ENCOUNTER — Ambulatory Visit: Admitting: Podiatry

## 2023-07-24 ENCOUNTER — Telehealth: Payer: Self-pay | Admitting: Physical Therapy

## 2023-07-24 ENCOUNTER — Ambulatory Visit: Admitting: Family Medicine

## 2023-07-24 ENCOUNTER — Ambulatory Visit: Admitting: Physical Therapy

## 2023-07-24 NOTE — Telephone Encounter (Signed)
 This is to document my attempt to call patient after no-show for PT appt this AM.  This is patient's # 1 missed appt.   Primary phone number(s) was used in efforts to contact the patient.   Unable to leave message. Voice mail box not set up.  Marilou Showman, PT, DPT

## 2023-07-30 ENCOUNTER — Ambulatory Visit: Admitting: Physical Therapy

## 2023-07-30 ENCOUNTER — Telehealth: Payer: Self-pay

## 2023-07-30 ENCOUNTER — Encounter: Payer: Self-pay | Admitting: Physical Therapy

## 2023-07-30 DIAGNOSIS — M79672 Pain in left foot: Secondary | ICD-10-CM | POA: Diagnosis not present

## 2023-07-30 DIAGNOSIS — R208 Other disturbances of skin sensation: Secondary | ICD-10-CM | POA: Diagnosis not present

## 2023-07-30 DIAGNOSIS — R600 Localized edema: Secondary | ICD-10-CM

## 2023-07-30 DIAGNOSIS — M79671 Pain in right foot: Secondary | ICD-10-CM | POA: Diagnosis not present

## 2023-07-30 DIAGNOSIS — M5412 Radiculopathy, cervical region: Secondary | ICD-10-CM | POA: Diagnosis not present

## 2023-07-30 NOTE — Progress Notes (Signed)
   07/30/2023  Patient ID: Danny Fuller, male   DOB: May 27, 1975, 48 y.o.   MRN: [id number]  Attempted to contact patient to reschedule missed appt for TNM diabetes management. Unable to leave voicemail at this time.   Carnell Christian, PharmD Clinical Pharmacist [phone]

## 2023-07-30 NOTE — Therapy (Signed)
 OUTPATIENT PHYSICAL THERAPY CERVICAL TREATMENT - DISCHARGE SUMMARY   Patient Name: Danny Fuller MRN: [id number] DOB:Oct 19, 1975, 48 y.o., male Today's Date: 07/30/2023  PHYSICAL THERAPY DISCHARGE SUMMARY  Visits from Start of Care: 6  Current functional level related to goals / functional outcomes: See clinical impression statement.   Remaining deficits: Intermittent variable upper body pain - positioning dependent   Education / Equipment: Continue HEP.  Progress towards goals.  Process for discharge today and process for returning w/ new referral.  Business card for ongoing questions.  Discussed need to continue working on functional grip activities at home and putty exercises.  Discussed postural and sleep positioning for comfort - pt has difficulty with unmanly positions like pillows between knees (he is a side sleeper) or supporting chest or back at 45 degree angle vs laying directly on shoulder.    Patient agrees to discharge. Patient goals were partially met. Patient is being discharged due to maximized rehab potential.    END OF SESSION:  PT End of Session - 07/30/23 0852     Visit Number 6    Number of Visits 9   8 + eval   Date for PT Re-Evaluation 08/10/23   pushed out due to scheduling delay   Authorization Type [employer] DUAL [employer]PLETE    PT Start Time 0849    PT Stop Time 0928    PT Time Calculation (min) 39 min    Equipment Utilized During Treatment Gait belt    Activity Tolerance Patient tolerated treatment well    Behavior During Therapy WFL for tasks assessed/performed          Past Medical History:  Diagnosis Date   Asthma    Back pain    Bulging Disk   Chronic kidney disease    Diabetes mellitus    sees Donis Furnish NP at [facility]   Diabetic foot ulcers [facility])    sees Dr. Amada Backer    GERD (gastroesophageal reflux disease)    Glaucoma    Headache(784.0)    Hyperlipidemia    Hypertension    sees Dr. Lavonne Prairie     Sleep apnea    CPAP   Past Surgical History:  Procedure Laterality Date   AMPUTATION Left 07/26/2015   Procedure: AMPUTATION LEFT FIFTH RAY;  Surgeon: Timothy Ford, MD;  Location: [facility] OR;  Service: Orthopedics;  Laterality: Left;   bilateral hip pins placed     INCISION AND DRAINAGE PERIRECTAL ABSCESS Right 03/07/2016   Procedure: IRRIGATION AND DEBRIDEMENT PERIRECTAL ABSCESS;  Surgeon: Juanita Norlander, MD;  Location: [facility] ORS;  Service: General;  Laterality: Right;   INCISION AND DRAINAGE PERIRECTAL ABSCESS Right 03/09/2016   Procedure: EXAM UNDER ANESTHESIA, IRRIGATION AND DEBRIDEMENT PERIRECTAL ABSCESS;  Surgeon: Jacolyn Matar, MD;  Location: [facility] ORS;  Service: General;  Laterality: Right;  Open, betadine packed wound   TOE AMPUTATION Left 11/19/2019   Removal of all toes from left foot   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/20/2019   Procedure: TRANSMETATARSAL AMPUTATION LEFT FOOT;  Surgeon: Amada Backer, MD;  Location: [facility] OR;  Service: Orthopedics;  Laterality: Left;   Patient Active Problem List   Diagnosis Date Noted   Anxiety with depression 05/18/2022   PAF (paroxysmal atrial fibrillation) (HCC) 11/01/2021   Secondary hypercoagulable state (HCC) 10/28/2021   Osteomyelitis of ankle and foot (HCC) 11/20/2019   Ulcer of left foot due to type 2 diabetes mellitus (HCC) 10/24/2019   Educated about COVID-19 virus infection 06/23/2019   Diabetic peripheral neuropathy (  HCC) 12/16/2018   Pain due to onychomycosis of toenails of both feet 08/07/2018   Pre-ulcerative calluses 08/07/2018   Acquired absence of left foot (HCC) 08/07/2018   Hypotension 04/04/2018   Leukocytosis 04/04/2018   Dyslipidemia 04/01/2018   Pulmonary hypertension (HCC) 04/01/2018   Chronic low back pain with bilateral sciatica 07/24/2017   Hyperlipidemia 03/02/2017   Chest pain 03/02/2017   Acute renal failure superimposed on stage 2 chronic kidney disease (HCC) 02/07/2017   OSA (obstructive sleep apnea) 01/29/2017    Insomnia 01/29/2017   PVD (peripheral vascular disease) (HCC) 11/28/2016   Palpitation 11/28/2016   Erectile dysfunction associated with type 2 diabetes mellitus (HCC) 05/08/2016   Erectile dysfunction due to type 2 diabetes mellitus (HCC) 05/08/2016   Poorly controlled diabetes mellitus (HCC) 03/30/2016   Necrotizing fasciitis (HCC) 03/07/2016   Pyelonephritis 03/04/2016   Acquired contracture of Achilles tendon, left 02/15/2016   Arthralgia of multiple joints 01/19/2016   Diabetic polyneuropathy associated with type 2 diabetes mellitus (HCC) 01/18/2016   Acquired absence of other left toe(s) (HCC) 01/18/2016   History of transmetatarsal amputation of left foot (HCC) 01/04/2016   Peripheral neuropathy 11/11/2015   AKI (acute kidney injury) (HCC)    Asthma 07/23/2015   Chronic headache 07/23/2015   Erectile disorder due to medical condition in male 04/23/2015   BMI 33.0-33.9,adult 04/21/2012   GERD 04/21/2008   Type 2 diabetes mellitus with diabetic neuropathy, with long-term current use of insulin  (HCC) 11/21/2006   Essential hypertension 11/21/2006   Lumbar sprain 11/21/2006    PCP: Donley Furth, MD  REFERRING PROVIDER: Daryel Ensign, DO  REFERRING DIAG: E11.42 (ICD-10-CM) - Diabetic polyneuropathy associated with type 2 diabetes mellitus (HCC) M54.12 (ICD-10-CM) - Cervical radiculopathy  THERAPY DIAG:  Pain in both feet  Radiculopathy, cervical region  Other disturbances of skin sensation  Localized edema  Rationale for Evaluation and Treatment: Rehabilitation  ONSET DATE: early to mid-March 2025  SUBJECTIVE:                                                                                                                                                                                                         SUBJECTIVE STATEMENT: He had a headache when he woke up, but this has resolved.  He has some pain last Monday on the left side, but he thought this was related  to how he slept because it went away.  He typically does he HEP in the morning/around lunchtime and these do help.  He is fine with discharging today.  Hand dominance: Left  PERTINENT HISTORY:  DM2, HTN, GERD, peripheral neuropathy, Transmet amputation of left foot (2021), contracture of left achilles tendon, PVD, CKD, chronic low back pain, PAF, anxiety, depression, bilateral hip pins  PAIN:  Are you having pain? No    PRECAUTIONS: Fall  RED FLAGS: None     WEIGHT BEARING RESTRICTIONS: No  FALLS:  Has patient fallen in last 6 months? Yes. Number of falls 1 - lost balance randomly due to left foot being out-turned.  LIVING ENVIRONMENT: Lives with: lives with their family Lives in: House/apartment Stairs: Yes: External: 6 steps; can reach both Has following equipment at home: Grab bars  OCCUPATION: Naval architect, grounds maintenance (retired)  PLOF: Independent  PATIENT GOALS: to feel better  NEXT MD VISIT: [facility] (4/29) - pt states they contacted him today about rescheduling  OBJECTIVE:  Note: Objective measures were completed at Evaluation unless otherwise noted.  DIAGNOSTIC FINDINGS:  No recent relevant imaging.  PATIENT SURVEYS:  NDI 14/50 = 28% = low to moderate perceived disability  COGNITION: Overall cognitive status: Within functional limits for tasks assessed  SENSATION: Light touch: Impaired   POSTURE: No Significant postural limitations  PALPATION: Right upper trap tightness and right scalenes tenderness, no palpable trigger points   CERVICAL ROM:   Active ROM A/PROM (deg) eval  Flexion 24 degrees  Extension 28 degrees  Right lateral flexion   Left lateral flexion   Right rotation 41 deg  Left rotation 42 deg, sudden sharp pain in left lateral mid-cervical region    (Blank rows = not tested)  UPPER EXTREMITY ROM:  Active ROM Right eval Left eval  Shoulder flexion Grossly WFL, w/ IR pt has shooting ache  from R shoulder to elbow  Shoulder extension   Shoulder abduction   Shoulder adduction   Shoulder extension   Shoulder internal rotation   Shoulder external rotation   Elbow flexion   Elbow extension   Wrist flexion    Wrist extension    Wrist ulnar deviation    Wrist radial deviation    Wrist pronation    Wrist supination     (Blank rows = not tested)  UPPER EXTREMITY MMT:  MMT Right eval Left eval  Shoulder flexion 4+/5 4+/5  Shoulder extension    Shoulder abduction 4+/5 4+/5  Shoulder adduction    Shoulder extension    Shoulder internal rotation    Shoulder external rotation    Middle trapezius    Lower trapezius    Elbow flexion 5/5 5/5  Elbow extension 4+/5 4+/5  Wrist flexion 5/5 5/5  Wrist extension 5/5 5/5  Wrist ulnar deviation    Wrist radial deviation    Wrist pronation    Wrist supination    Grip strength     (Blank rows = not tested)  CERVICAL SPECIAL TESTS:  Upper limb tension test (ULTT): Radial and Median 2 + and Distraction test: Negative - pt felt sharp pains in right lateral neck - possibly myofascial restriction  FUNCTIONAL TESTS:  mCTSIB:  To be assessed.  TREATMENT DATE: 07/30/2023  LTGs:   -Confirmed he has current updated HEP - no concerns at this time. -NDI:  7/50 = 14% -Radial nerve:  Negative -Median 1 and 2:  Negative -Assessed mCTSIB to determine impaired balance/ongoing balance needs:  120/120 -R grip:  107 lbs, 110lbs, 111 lbs = 109.33 lbs (avg) -L grip: 117 lbs, 106 lbs, 106 lbs = 109.67 lbs (avg)  PATIENT EDUCATION:  Education details: Continue HEP.  Progress towards goals.  Process for discharge today and process for returning w/ new referral.  Business card for ongoing questions.  Discussed need to continue working on functional grip activities at home and putty exercises.  Discussed postural and sleep  positioning for comfort - pt has difficulty with unmanly positions like pillows between knees (he is a side sleeper) or supporting chest or back at 45 degree angle vs laying directly on shoulder.   Person educated: Patient Education method: Explanation Education comprehension: verbalized understanding  HOME EXERCISE PROGRAM: Access Code: [email or web address] URL: [email or web address] Date: 06/11/2023 Prepared by: Marilou Showman  Exercises - Putty Squeezes  - 1 x daily - 3 x weekly - 3 sets - 10 reps - Thumb Opposition with Putty  - 1 x daily - 3 x weekly - 3 sets - 10 reps - Seated Finger Composite Flexion with Putty  - 1 x daily - 3 x weekly - 3 sets - 10 reps - Radial Nerve Flossing  - 1 x daily - 5 x weekly - 2 sets - 10 reps - Median Nerve Tensioner  - 1 x daily - 5 x weekly - 2 sets - 5 reps - 5 seconds hold - Seated Digit Tendon Gliding  - 1 x daily - 3 x weekly - 2 sets - 10 reps - Corner Balance Feet Together With Eyes Closed  - 1 x daily - 5 x weekly - 1 sets - 3-4 reps - 30 seconds hold - Corner Balance Feet Apart: Eyes Closed With Head Turns  - 1 x daily - 4 x weekly - 3 sets - 10 reps - Standing Ulnar Nerve Glide  - 1 x daily - 3-4 x weekly - 3 sets - 10 reps  ASSESSMENT:  CLINICAL IMPRESSION: Assessed LTGs this visit with pt meeting 3 of 4 goals as written.  His grip strength decreased some, but pt has not been working on putty or other functional grip exercises at home and continues to have left ulnar nerve symptoms intermittently.  We discussed sleep positioning, but similar to other positions used in therapy, he was not open to suggestions as they were unmanly.  He may benefit from another episode of PT in the future should his symptoms progress or change.  At current he is in agreement to discharge with continued use of HEP for management of symptoms.  Will continue with discharge.  OBJECTIVE IMPAIRMENTS: decreased activity tolerance, decreased mobility,  decreased ROM, hypomobility, impaired flexibility, impaired sensation, impaired UE functional use, improper body mechanics, and pain.   ACTIVITY LIMITATIONS: carrying, lifting, stairs, reach over head, and locomotion level  PARTICIPATION LIMITATIONS: driving, community activity, occupation, and yard work  PERSONAL FACTORS: Past/current experiences, Time since onset of injury/illness/exacerbation, and 1 comorbidity: HTN are also affecting patient's functional outcome.   REHAB POTENTIAL: Good  CLINICAL DECISION MAKING: Evolving/moderate complexity  EVALUATION COMPLEXITY: Moderate   GOALS: Goals reviewed with patient? Yes  SHORT TERM GOALS: Target date: 07/06/2023  Pt will be independent and compliant with initial nerve glide, cervical ROM, and balance focused HEP in order  to maintain functional progress and improve mobility. Baseline: HEP verbally reviewed, pt ind and compliant (6/2) Goal status: MET  2.  mCTSIB to be assessed w/ STG/LTG set as appropriate. Baseline: Not assessed. Goal status: REVISED - D/C'd  3.  Pt will demonstrate 5 degree increase in cervical rotation bilaterally w/o increased pain to improve general mobility. Baseline: 41 right, 42 left; 60 left, 56 right (6/2) Goal status: MET  4.  Grip strength to be assessed w/ LTG to be set as appropriate. Baseline: Assessed 4/28. Goal status: MET  LONG TERM GOALS: Target date: 08/03/2023  Pt will be independent and compliant with advanced and finalized cervical mobility, strength and balance HEP in order to maintain functional progress and improve mobility. Baseline: IND and compliant (6/16) Goal status: MET  2.  Pt will improve NDI score to </=7/50 to indicate improved pain management and functional mobility. Baseline: 14/50 = 28%; 7/50 = 14% (6/16) Goal status: MET  3.  mCTSIB to be assessed w/ STG/LTG set as appropriate. Baseline: Not assessed. Goal status: REVISED - D/C'd  4.  Pt will have negative ULTT  findings on re-assessment to indicate improved symptom management and nerve mobility. Baseline: + radial and median 2; both negative (6/16) Goal status: MET  5.  Pt will improve bilateral grip strength by >/= 10lb in order to demonstrate functional improvement. Baseline: 117.8 lb avg (RUE) and 121.9 lb avg (RUE); R 109.33 lbs (avg) and  L 109.67 lbs (avg) (6/16) Goal status: NOT MET  PLAN:  PT FREQUENCY: 1x/week  PT DURATION: 8 weeks  PLANNED INTERVENTIONS: 78469- PT Re-evaluation, 97750- Physical Performance Testing, 97110-Therapeutic exercises, 97530- Therapeutic activity, [id number]- Neuromuscular re-education, 97535- Self Care, 62952- Manual therapy, [id number]- Gait training, [phone]- Aquatic Therapy, [phone]- Electrical stimulation (manual), [phone]- Traction (mechanical), Patient/Family education, Balance training, Stair training, Taping, Dry Needling, Joint mobilization, Spinal mobilization, Vestibular training, DME instructions, Cryotherapy, and Moist heat  PLAN FOR NEXT SESSION: N/A   Earlean Glaze, PT, DPT 07/30/2023, 9:32 AM

## 2023-07-31 ENCOUNTER — Ambulatory Visit (INDEPENDENT_AMBULATORY_CARE_PROVIDER_SITE_OTHER): Admitting: Family Medicine

## 2023-07-31 DIAGNOSIS — Z0000 Encounter for general adult medical examination without abnormal findings: Secondary | ICD-10-CM

## 2023-07-31 NOTE — Progress Notes (Signed)
 PATIENT CHECK-IN and HEALTH RISK ASSESSMENT QUESTIONNAIRE:  -completed by phone/video for upcoming Medicare Preventive Visit   Pre-Visit Check-in: 1)Vitals (height, wt, BP, etc) - record in vitals section for visit on day of visit Request home vitals (wt, BP, etc.) and enter into vitals, THEN update Vital Signs SmartPhrase below at the top of the HPI. See below.  2)Review and Update Medications, Allergies PMH, Surgeries, Social history in Epic 3)Hospitalizations in the last year with date/reason? n  4)Review and Update Care Team (patient's specialists) in Epic 5) Complete PHQ9 in Epic  6) Complete Fall Screening in Epic 7)Review all Health Maintenance Due and order under PCP if not done.  Medicare Wellness Patient Questionnaire:  Answer theses question about your habits: How often do you have a drink containing alcohol? Monthly or less How many drinks containing alcohol do you have on a typical day when you are drinking?1-2 How often do you have six or more drinks on one occasion?never Have you ever smoked?n  How many packs a day do/did you smoke? n Do you use smokeless tobacco?n Do you use an illicit drugs?n On average, how many days per week do you engage in moderate to strenuous exercise (like a brisk walk)?1  On average, how many minutes do you engage in exercise at this level?20  Are you sexually active? Sexually active, no concerns for STI Typical diet: lots of veggies, grilled meats - chicken, tries to avoid fried food. Drinks a lot of water. Good social connection more than 3 times per week  Answer theses question about your everyday activities: Can you perform most household chores?y Are you deaf or have significant trouble hearing?n Do you feel that you have a problem with memory?n Do you feel safe at home?y Last dentist visit?just went 2 months ago 8. Do you have any difficulty performing your everyday activities?n Are you having any difficulty walking, taking  medications on your own, and or difficulty managing daily home needs?n Do you have difficulty walking or climbing stairs?n Do you have difficulty dressing or bathing?n Do you have difficulty doing errands alone such as visiting a doctor's office or shopping?n Do you currently have any difficulty preparing food and eating?n Do you currently have any difficulty using the toilet?n Do you have any difficulty managing your finances?n Do you have any difficulties with housekeeping of managing your housekeeping?n   Do you have Advanced Directives in place (Living Will, Healthcare Power or Attorney)? N, considering   Last eye Exam and location? [place], reports went about 6 months ago, sees Dr. Mason Sole   Do you currently use prescribed or non-prescribed narcotic or opioid pain medications?n  Do you have a history or close family history of breast, ovarian, tubal or peritoneal cancer or a family member with BRCA (breast cancer susceptibility 1 and 2) gene mutations?n     ----------------------------------------------------------------------------------------------------------------------------------------------------------------------------------------------------------------------  Because this visit was a virtual/telehealth visit, some criteria may be missing or patient reported. Any vitals not documented were not able to be obtained and vitals that have been documented are patient reported.    MEDICARE ANNUAL PREVENTIVE CARE VISIT WITH PROVIDER (Welcome to Medicare, initial annual wellness or annual wellness exam)  Virtual Visit via Phone Note  I connected with Annamary Barrio on 07/31/23  by phone and verified that I am speaking with the correct person using two identifiers. Prefers phone.   Location patient: home Location provider:work or home office Persons participating in the virtual visit: patient, provider  Concerns and/or follow  up today: reports all is stable, was doing  some PT for shoulder and now is improving.    See HM section in Epic for other details of completed HM.    ROS: negative for report of fevers, unintentional weight loss, vision changes, vision loss, hearing loss or change, chest pain, sob, hemoptysis, melena, hematochezia, hematuria, falls, bleeding or bruising, thoughts of suicide or self harm, memory loss  Patient-completed extensive health risk assessment - reviewed and discussed with the patient: See Health Risk Assessment completed with patient prior to the visit either above or in recent phone note. This was reviewed in detailed with the patient today and appropriate recommendations, orders and referrals were placed as needed per Summary below and patient instructions.   Review of Medical History: -PMH, PSH, Family History and current specialty and care providers reviewed and updated and listed below   Patient Care Team: Donley Furth, MD as PCP - General (Family Medicine) Eilleen Grates, MD as PCP - Cardiology (Cardiology) Daryel Ensign, DO as Consulting Physician (Neurology) Zilphia Hilt, Daria Eddy, [facility] (Pharmacist)   Past Medical History:  Diagnosis Date   Asthma    Back pain    Bulging Disk   Chronic kidney disease    Diabetes mellitus    sees Donis Furnish NP at [facility]   Diabetic foot ulcers [facility])    sees Dr. Amada Backer    GERD (gastroesophageal reflux disease)    Glaucoma    Headache(784.0)    Hyperlipidemia    Hypertension    sees Dr. Lavonne Prairie    Sleep apnea    CPAP    Past Surgical History:  Procedure Laterality Date   AMPUTATION Left 07/26/2015   Procedure: AMPUTATION LEFT FIFTH RAY;  Surgeon: Timothy Ford, MD;  Location: [facility] OR;  Service: Orthopedics;  Laterality: Left;   bilateral hip pins placed     INCISION AND DRAINAGE PERIRECTAL ABSCESS Right 03/07/2016   Procedure: IRRIGATION AND DEBRIDEMENT PERIRECTAL ABSCESS;  Surgeon: Juanita Norlander, MD;  Location: [facility] ORS;  Service: General;   Laterality: Right;   INCISION AND DRAINAGE PERIRECTAL ABSCESS Right 03/09/2016   Procedure: EXAM UNDER ANESTHESIA, IRRIGATION AND DEBRIDEMENT PERIRECTAL ABSCESS;  Surgeon: Jacolyn Matar, MD;  Location: [facility] ORS;  Service: General;  Laterality: Right;  Open, betadine packed wound   TOE AMPUTATION Left 11/19/2019   Removal of all toes from left foot   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/20/2019   Procedure: TRANSMETATARSAL AMPUTATION LEFT FOOT;  Surgeon: Amada Backer, MD;  Location: [facility] OR;  Service: Orthopedics;  Laterality: Left;    Social History   Socioeconomic History   Marital status: Married    Spouse name: Not on file   Number of children: 2   Years of education: 12   Highest education level: 12th grade  Occupational History   Occupation: disability  Tobacco Use   Smoking status: Former    Current packs/day: 0.00    Average packs/day: 0.5 packs/day for 20.0 years (10.0 ttl pk-yrs)    Types: Cigarettes    Start date: 07/23/1995    Quit date: 07/23/2015    Years since quitting: 8.0   Smokeless tobacco: Never   Tobacco comments:    Former smoker 10/27/21  Vaping Use   Vaping status: Never Used  Substance and Sexual Activity   Alcohol use: Yes    Alcohol/week: 1.0 standard drink of alcohol    Types: 1 Standard drinks or equivalent per week    Comment: 1 drink every 1-3  months 10/27/21   Drug use: Never   Sexual activity: Not on file  Other Topics Concern   Not on file  Social History Narrative   Lives with wife and two children in a one story home.  Not working.  Still trying for disability.     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     Education: high school.    Left handed   One story home   Social Drivers of Health   Financial Resource Strain: Medium Risk (07/30/2023)   Overall Financial Resource Strain (CARDIA)    Difficulty of Paying Living Expenses: Somewhat hard  Food Insecurity: Food Insecurity Present (07/30/2023)   Hunger Vital Sign     Worried About Running Out of Food in the Last Year: Never true    Ran Out of Food in the Last Year: Sometimes true  Transportation Needs: No Transportation Needs (07/30/2023)   PRAPARE - Administrator, Civil Service (Medical): No    Lack of Transportation (Non-Medical): No  Physical Activity: Insufficiently Active (07/30/2023)   Exercise Vital Sign    Days of Exercise per Week: 1 day    Minutes of Exercise per Session: 20 min  Stress: No Stress Concern Present (07/30/2023)   [employer] of Occupational Health - Occupational Stress Questionnaire    Feeling of Stress: Not at all  Social Connections: Socially Integrated (07/30/2023)   Social Connection and Isolation Panel    Frequency of Communication with Friends and Family: More than three times a week    Frequency of Social Gatherings with Friends and Family: Three times a week    Attends Religious Services: More than 4 times per year    Active Member of Clubs or Organizations: Yes    Attends Banker Meetings: More than 4 times per year    Marital Status: Married  Catering manager Violence: Not At Risk (05/03/2021)   Humiliation, Afraid, Rape, and Kick questionnaire    Fear of Current or Ex-Partner: No    Emotionally Abused: No    Physically Abused: No    Sexually Abused: No    Family History  Problem Relation Age of Onset   Arthritis Other    Diabetes Other    Hypertension Other    Hyperlipidemia Other    Stroke Other    Sudden death Other    Diabetes Mother    Diabetes Sister    Diabetes Brother    Heart attack Father        Died ate 85, couple of heart attacks     Current Outpatient Medications on File Prior to Visit  Medication Sig Dispense Refill   amLODipine -olmesartan  (AZOR ) 10-40 MG tablet Take 1 tablet by mouth daily. Please call office to schedule an appt for further refills. Thank you 30 tablet 0   cloNIDine  (CATAPRES  - DOSED IN MG/24 HR) 0.3 mg/24hr patch APPLY 1 PATCH TOPICALLY  TO SKIN  ONCE WEEKLY 4 patch 11   Continuous Glucose Receiver (FREESTYLE LIBRE 3 READER) DEVI 1 each by Does not apply route daily. Use to check blood glucose continuously 1 each 0   Continuous Glucose Sensor (FREESTYLE LIBRE 3 PLUS SENSOR) MISC Use to monitor blood glucose continously. Change sensor every 15 days. 6 each 1   cyclobenzaprine  (FLEXERIL ) 10 MG tablet Take 1 tablet (10 mg total) by mouth 3 (three) times daily as needed for muscle spasms. 30 tablet 0   Dulaglutide  (TRULICITY ) 3 MG/0.5ML SOAJ Inject 3 mg as  directed once a week.     furosemide  (LASIX ) 20 MG tablet      glucose blood test strip 1 each by Other route 4 (four) times daily.      HUMALOG  100 UNIT/ML injection Inject into the skin as directed.     hydrALAZINE  (APRESOLINE ) 25 MG tablet Take 1 tablet (25 mg total) by mouth 3 (three) times daily. 180 tablet 2   HYDROcodone -acetaminophen  (NORCO) 10-325 MG tablet TAKE 1 TABLET BY MOUTH EVERY 4 TO 6 HOURS FOR 5 DAYS AS NEEDED     insulin  aspart (NOVOLOG ) 100 UNIT/ML injection      Insulin  Disposable Pump (OMNIPOD DASH PODS, GEN 4,) MISC INJECT 1 EACH INTO THE SKIN CONTINUOUS. APPLY 1 OMNIPOD INSULIN  PUMP TO BODY DAILY FOR INSULIN  30 each 1   Insulin  Pen Needle 31G X 5 MM MISC 1 each by Other route daily.      labetalol  (NORMODYNE ) 100 MG tablet 100 mg.     nortriptyline  (PAMELOR ) 10 MG capsule TAKE 4 CAPSULES BY MOUTH AT BEDTIME 360 capsule 3   pravastatin  (PRAVACHOL ) 40 MG tablet TAKE 1 TABLET BY MOUTH EVERY  EVENING 100 tablet 0   pregabalin  (LYRICA ) 150 MG capsule Take 1 capsule (150 mg total) by mouth 3 (three) times daily. 90 capsule 5   spironolactone (ALDACTONE) 25 MG tablet Take 25 mg by mouth daily.     traMADol  (ULTRAM ) 50 MG tablet Take 2 tablets (100 mg total) by mouth every 6 (six) hours as needed for moderate pain. 360 tablet 1   traMADol  (ULTRAM ) 50 MG tablet Take 2 tablets (100 mg total) by mouth every 6 (six) hours as needed. 240 tablet 1   No current  facility-administered medications on file prior to visit.    Allergies  Allergen Reactions   Augmentin  [Amoxicillin -Pot Clavulanate] Itching and Other (See Comments)    Dizziness   Vancomycin  Other (See Comments)    AKI       Physical Exam Vitals requested from patient and listed below if patient had equipment and was able to obtain at home for this virtual visit: There were no vitals filed for this visit. Estimated body mass index is 34.94 kg/m as calculated from the following:   Height as of 05/22/23: 5' 10.5 (1.791 m).   Weight as of 05/22/23: 247 lb (112 kg).  EKG (optional): deferred due to virtual visit  GENERAL: alert, oriented, no acute distress detected; full vision exam deferred due to pandemic and/or virtual encounter   PSYCH/NEURO: pleasant and cooperative, no obvious depression or anxiety, speech and thought processing grossly intact, Cognitive function grossly intact  Flowsheet Row Office Visit from 05/18/2022 in [facility]  PHQ-9 Total Score 2        07/31/2023    5:59 PM 05/18/2022    9:19 AM 05/09/2022   11:35 AM 11/18/2021    9:06 AM 09/28/2021   10:39 AM  Depression screen PHQ 2/9  Decreased Interest 0 1 1 1 1   Down, Depressed, Hopeless 0 0 0 0 0  PHQ - 2 Score 0 1 1 1 1   Altered sleeping  0 0 0 0  Tired, decreased energy  1 1 1 1   Change in appetite  0 0 0 0  Feeling bad or failure about yourself   0 0 0 0  Trouble concentrating  0 0 0 0  Moving slowly or fidgety/restless  0 0 0 0  Suicidal thoughts  0 0 0 0  PHQ-9 Score  2 2 2 2   Difficult doing work/chores  Somewhat difficult  Not difficult at all Not difficult at all       11/18/2021    9:06 AM 05/09/2022   11:36 AM 05/18/2022    9:19 AM 05/22/2023    1:02 PM 07/31/2023    5:59 PM  Fall Risk  Falls in the past year? 1 1 1 1  0  Was there an injury with Fall? 1 0 0 0 0  Fall Risk Category Calculator 3 2 1 2  0  Fall Risk Category (Retired) High       (RETIRED)  Patient Fall Risk Level Low fall risk       Patient at Risk for Falls Due to No Fall Risks History of fall(s) No Fall Risks    Fall risk Follow up Falls evaluation completed  Falls evaluation completed Falls evaluation completed Falls evaluation completed Falls evaluation completed     Data saved with a previous flowsheet row definition     SUMMARY AND PLAN:  Encounter for Medicare annual wellness exam   Discussed applicable health maintenance/preventive health measures and advised and referred or ordered per patient preferences: -discussed vaccine recs/risks - he knows can get at the pharmacy -updated eye exam on record -advised of labs due and he reports he will do with Dr. Alyne Babinski -he declined colonscopy, but agrees to discuss further with Dr. Alyne Babinski  Health Maintenance  Topic Date Due   Hepatitis C Screening  Never done   Pneumococcal Vaccine 13-53 Years old (1 of 2 - PCV) Never done   Colonoscopy  Never done   COVID-19 Vaccine (3 - 2024-25 season) 10/15/2022   HEMOGLOBIN A1C  11/17/2022   INFLUENZA VACCINE  09/14/2023   FOOT EXAM  02/29/2024   Diabetic kidney evaluation - Urine ACR  04/27/2024   Diabetic kidney evaluation - eGFR measurement  05/20/2024   OPHTHALMOLOGY EXAM  06/11/2024   Medicare Annual Wellness (AWV)  07/30/2024   DTaP/Tdap/Td (2 - Td or Tdap) 05/08/2029   HIV Screening  Completed   HPV VACCINES  Aged Out   Meningococcal B Vaccine  Aged Out     Education and counseling on the following was provided based on the above review of health and a plan/checklist for the patient, along with additional information discussed, was provided for the patient in the patient instructions :  -Advised on importance of completing advanced directives, discussed options for completing and provided information in patient instructions as well -Advised and counseled on a healthy lifestyle - including the importance of a healthy diet, regular physical activity, social connections and  stress management. -Reviewed patient's current diet. Advised and counseled on a whole foods based healthy diet. A summary of a healthy diet was provided in the Patient Instructions.  -reviewed patient's current physical activity level and discussed exercise guidelines for adults. Discussed community resources and ideas for safe exercise at home to assist in meeting exercise guideline recommendations in a safe and healthy way.  -Advise yearly dental visits at minimum and regular eye exams -advised regular follow up with Dr. Alyne Babinski soon in the next month or so, sent message to schedulers to assist and he agrees to call in the morning to schedule. Follow up: see patient instructions   Patient Instructions  I really enjoyed getting to talk with you today! I am available on Tuesdays and Thursdays for virtual visits if you have any questions or concerns, or if I can be of any further  assistance.   CHECKLIST FROM ANNUAL WELLNESS VISIT:  -Follow up (please call to schedule if not scheduled after visit):   -yearly for annual wellness visit with primary care office  Here is a list of your preventive care/health maintenance measures and the plan for each if any are due:  PLAN For any measures below that may be due:   You are due for colon cancer screening. If you change your mind please call so that we can schedule. There are various options including the colonsocopy, cologuard and stool cards.   2.  You can get the vaccines at the pharmacy, please let us  know when you do so that we can update your record.  3. Call to schedule if office visit in the next 1-2 months with Dr. Alyne Babinski. Can get labs then.   Health Maintenance  Topic Date Due   Hepatitis C Screening  Never done   Pneumococcal Vaccine 75-36 Years old (1 of 2 - PCV) Never done   Colonoscopy  Never done   COVID-19 Vaccine (3 - 2024-25 season) 10/15/2022   HEMOGLOBIN A1C  11/17/2022   INFLUENZA VACCINE  09/14/2023   FOOT EXAM  02/29/2024    Diabetic kidney evaluation - Urine ACR  04/27/2024   Diabetic kidney evaluation - eGFR measurement  05/20/2024   OPHTHALMOLOGY EXAM  06/11/2024   Medicare Annual Wellness (AWV)  07/30/2024   DTaP/Tdap/Td (2 - Td or Tdap) 05/08/2029   HIV Screening  Completed   HPV VACCINES  Aged Out   Meningococcal B Vaccine  Aged Out    -See a dentist at least yearly  -Get your eyes checked and then per your eye specialist's recommendations  -Other issues addressed today:   -I have included below further information regarding a healthy whole foods based diet, physical activity guidelines for adults, stress management and opportunities for social connections. I hope you find this information useful.   -----------------------------------------------------------------------------------------------------------------------------------------------------------------------------------------------------------------------------------------------------------    NUTRITION: -eat real food: lots of colorful vegetables (half the plate) and fruits -5-7 servings of vegetables and fruits per day (fresh or steamed is best), exp. 2 servings of vegetables with lunch and dinner and 2 servings of fruit per day. Berries and greens such as kale and collards are great choices.  -consume on a regular basis:  fresh fruits, fresh veggies, fish, nuts, seeds, healthy oils (such as olive oil, avocado oil), whole grains (make sure for bread/pasta/crackers/etc., that the first ingredient on label contains the word whole), legumes. -can eat small amounts of dairy and lean meat (no larger than the palm of your hand), but avoid processed meats such as ham, bacon, lunch meat, etc. -drink water -try to avoid fast food and pre-packaged foods, processed meat, ultra processed foods/beverages (donuts, candy, etc.) -most experts advise limiting sodium to < 2300mg  per day, should limit further is any chronic conditions such as high blood  pressure, heart disease, diabetes, etc. The American Heart Association advised that < 1500mg  is is ideal -try to avoid foods/beverages that contain any ingredients with names you do not recognize  -try to avoid foods/beverages  with added sugar or sweeteners/sweets  -try to avoid sweet drinks (including diet drinks): soda, juice, Gatorade, sweet tea, power drinks, diet drinks -try to avoid white rice, white bread, pasta (unless whole grain)  EXERCISE GUIDELINES FOR ADULTS: -if you wish to increase your physical activity, do so gradually and with the approval of your doctor -STOP and seek medical care immediately if you have any chest pain, chest discomfort  or trouble breathing when starting or increasing exercise  -move and stretch your body, legs, feet and arms when sitting for long periods -Physical activity guidelines for optimal health in adults: -get at least 150 minutes per week of moderate exercise (can talk, but not sing); this is about 20-30 minutes of sustained activity 5-7 days per week or two 10-15 minute episodes of sustained activity 5-7 days per week -do some muscle building/resistance training/strength training at least 2 days per week  -balance exercises 3+ days per week:   Stand somewhere where you have something sturdy to hold onto if you lose balance    1) lift up on toes, then back down, start with 5x per day and work up to 20x   2) stand and lift one leg straight out to the side so that foot is a few inches of the floor, start with 5x each side and work up to 20x each side   3) stand on one foot, start with 5 seconds each side and work up to 20 seconds on each side  If you need ideas or help with getting more active:  -Silver sneakers https://tools.silversneakers.com  -Walk with a Doc: http://www.duncan-williams.com/  -try to include resistance (weight lifting/strength building) and balance exercises twice per week: or the following link for  ideas: http://castillo-powell.com/  BuyDucts.dk  STRESS MANAGEMENT: -can try meditating, or just sitting quietly with deep breathing while intentionally relaxing all parts of your body for 5 minutes daily -if you need further help with stress, anxiety or depression please follow up with your primary doctor or contact the wonderful folks at [facility]: [phone]  SOCIAL CONNECTIONS: -options in [place] if you wish to engage in more social and exercise related activities:  -Silver sneakers https://tools.silversneakers.com  -Walk with a Doc: http://www.duncan-williams.com/  -Check out the [facility] Active Adults 50+ section on the [place] of [employer] (hiking clubs, book clubs, cards and games, chess, exercise classes, aquatic classes and much more) - see the website for details: https://www.[place].gov/departments/parks-recreation/active-adults50  -YouTube has lots of exercise videos for different ages and abilities as well  -[facility] (a variety of indoor and outdoor inperson activities for adults). [phone]. [address].  -Virtual Online Classes (a variety of topics): see seniorplanet.org or call [phone]  -consider volunteering at a school, hospice center, church, senior center or elsewhere          ADVANCED HEALTHCARE DIRECTIVES:  [place] Advanced Directives assistance:   ExpressWeek.com.cy  Everyone should have advanced health care directives in place. This is so that you get the care you want, should you ever be in a situation where you are unable to make your own medical decisions.   From the [place] Advanced Directive Website: Advance Health Care Directives are legal documents in which you give written instructions about your health care if, in the future, you cannot  speak for yourself.   A health care power of attorney allows you to name a person you trust to make your health care decisions if you cannot make them yourself. A declaration of a desire for a natural death (or living will) is document, which states that you desire not to have your life prolonged by extraordinary measures if you have a terminal or incurable illness or if you are in a vegetative state. An advance instruction for mental health treatment makes a declaration of instructions, information and preferences regarding your mental health treatment. It also states that you are aware that the advance instruction authorizes  a mental health treatment provider to act according to your wishes. It may also outline your consent or refusal of mental health treatment. A declaration of an anatomical gift allows anyone over the age of 20 to make a gift by will, organ donor card or other document.   Please see the following website or an elder law attorney for forms, FAQs and for completion of advanced directives: [place]  Print production planner Health Care Directives Advance Health Care Directives ([email or web address]  Or copy and paste the following to your web browser: [email or web address]    Maurie Southern, DO

## 2023-07-31 NOTE — Patient Instructions (Signed)
 I really enjoyed getting to talk with you today! I am available on Tuesdays and Thursdays for virtual visits if you have any questions or concerns, or if I can be of any further assistance.   CHECKLIST FROM ANNUAL WELLNESS VISIT:  -Follow up (please call to schedule if not scheduled after visit):   -yearly for annual wellness visit with primary care office  Here is a list of your preventive care/health maintenance measures and the plan for each if any are due:  PLAN For any measures below that may be due:   You are due for colon cancer screening. If you change your mind please call so that we can schedule. There are various options including the colonsocopy, cologuard and stool cards.   2.  You can get the vaccines at the pharmacy, please let us  know when you do so that we can update your record.  3. Call to schedule if office visit in the next 1-2 months with Dr. Alyne Babinski. Can get labs then.   Health Maintenance  Topic Date Due   Hepatitis C Screening  Never done   Pneumococcal Vaccine 53-49 Years old (1 of 2 - PCV) Never done   Colonoscopy  Never done   COVID-19 Vaccine (3 - 2024-25 season) 10/15/2022   HEMOGLOBIN A1C  11/17/2022   INFLUENZA VACCINE  09/14/2023   FOOT EXAM  02/29/2024   Diabetic kidney evaluation - Urine ACR  04/27/2024   Diabetic kidney evaluation - eGFR measurement  05/20/2024   OPHTHALMOLOGY EXAM  06/11/2024   Medicare Annual Wellness (AWV)  07/30/2024   DTaP/Tdap/Td (2 - Td or Tdap) 05/08/2029   HIV Screening  Completed   HPV VACCINES  Aged Out   Meningococcal B Vaccine  Aged Out    -See a dentist at least yearly  -Get your eyes checked and then per your eye specialist's recommendations  -Other issues addressed today:   -I have included below further information regarding a healthy whole foods based diet, physical activity guidelines for adults, stress management and opportunities for social connections. I hope you find this information useful.    -----------------------------------------------------------------------------------------------------------------------------------------------------------------------------------------------------------------------------------------------------------    NUTRITION: -eat real food: lots of colorful vegetables (half the plate) and fruits -5-7 servings of vegetables and fruits per day (fresh or steamed is best), exp. 2 servings of vegetables with lunch and dinner and 2 servings of fruit per day. Berries and greens such as kale and collards are great choices.  -consume on a regular basis:  fresh fruits, fresh veggies, fish, nuts, seeds, healthy oils (such as olive oil, avocado oil), whole grains (make sure for bread/pasta/crackers/etc., that the first ingredient on label contains the word whole), legumes. -can eat small amounts of dairy and lean meat (no larger than the palm of your hand), but avoid processed meats such as ham, bacon, lunch meat, etc. -drink water -try to avoid fast food and pre-packaged foods, processed meat, ultra processed foods/beverages (donuts, candy, etc.) -most experts advise limiting sodium to < 2300mg  per day, should limit further is any chronic conditions such as high blood pressure, heart disease, diabetes, etc. The American Heart Association advised that < 1500mg  is is ideal -try to avoid foods/beverages that contain any ingredients with names you do not recognize  -try to avoid foods/beverages  with added sugar or sweeteners/sweets  -try to avoid sweet drinks (including diet drinks): soda, juice, Gatorade, sweet tea, power drinks, diet drinks -try to avoid white rice, white bread, pasta (unless whole grain)  EXERCISE GUIDELINES FOR  ADULTS: -if you wish to increase your physical activity, do so gradually and with the approval of your doctor -STOP and seek medical care immediately if you have any chest pain, chest discomfort or trouble breathing when starting or  increasing exercise  -move and stretch your body, legs, feet and arms when sitting for long periods -Physical activity guidelines for optimal health in adults: -get at least 150 minutes per week of moderate exercise (can talk, but not sing); this is about 20-30 minutes of sustained activity 5-7 days per week or two 10-15 minute episodes of sustained activity 5-7 days per week -do some muscle building/resistance training/strength training at least 2 days per week  -balance exercises 3+ days per week:   Stand somewhere where you have something sturdy to hold onto if you lose balance    1) lift up on toes, then back down, start with 5x per day and work up to 20x   2) stand and lift one leg straight out to the side so that foot is a few inches of the floor, start with 5x each side and work up to 20x each side   3) stand on one foot, start with 5 seconds each side and work up to 20 seconds on each side  If you need ideas or help with getting more active:  -Silver sneakers https://tools.silversneakers.com  -Walk with a Doc: http://www.duncan-[email or web address]/  -try to include resistance (weight lifting/strength building) and balance exercises twice per week: or the following link for ideas: http://castillo-powell.com/  BuyDucts.dk  STRESS MANAGEMENT: -can try meditating, or just sitting quietly with deep breathing while intentionally relaxing all parts of your body for 5 minutes daily -if you need further help with stress, anxiety or depression please follow up with your primary doctor or contact the wonderful folks at [facility]: [phone]  SOCIAL CONNECTIONS: -options in [place] if you wish to engage in more social and exercise related activities:  -Silver sneakers https://tools.silversneakers.com  -Walk with a Doc: http://www.duncan-williams.[email or web address]/  -Check out the [facility] Active Adults 50+  section on the [place] [employer] (hiking clubs, book clubs, cards and games, chess, exercise classes, aquatic classes and much more) - see the website for details: https://www.[place].gov/departments/parks-recreation/active-adults50  -YouTube has lots of exercise videos for different ages and abilities as well  -[facility] (a variety of indoor and outdoor inperson activities for adults). [phone]. [address]  -Virtual Online Classes (a variety of topics): see seniorplanet.org or call [phone]  -consider volunteering at a school, hospice center, church, senior center or elsewhere          ADVANCED HEALTHCARE DIRECTIVES:  [place] Advanced Directives assistance:   ExpressWeek.com.cy  Everyone should have advanced health care directives in place. This is so that you get the care you want, should you ever be in a situation where you are unable to make your own medical decisions.   From the [place] Advanced Directive Website: Advance Health Care Directives are legal documents in which you give written instructions about your health care if, in the future, you cannot speak for yourself.   A health care power of attorney allows you to name a person you trust to make your health care decisions if you cannot make them yourself. A declaration of a desire for a natural death (or living will) is document, which states that you desire not to have your life prolonged by extraordinary measures if you have a terminal or incurable illness or if you are in a vegetative  state. An advance instruction for mental health treatment makes a declaration of instructions, information and preferences regarding your mental health treatment. It also states that you are aware that the advance instruction authorizes a mental health treatment provider to act according to your wishes. It may also outline your consent or  refusal of mental health treatment. A declaration of an anatomical gift allows anyone over the age of 35 to make a gift by will, organ donor card or other document.   Please see the following website or an elder law attorney for forms, FAQs and for completion of advanced directives:   Print production planner Health Care Directives Advance Health Care Directives ([email or web address]  Or copy and paste the following to your web browser: [email or web address]

## 2023-08-09 ENCOUNTER — Ambulatory Visit (INDEPENDENT_AMBULATORY_CARE_PROVIDER_SITE_OTHER): Admitting: Podiatry

## 2023-08-09 ENCOUNTER — Other Ambulatory Visit (HOSPITAL_COMMUNITY): Payer: Self-pay | Admitting: Physician Assistant

## 2023-08-09 ENCOUNTER — Other Ambulatory Visit: Payer: Self-pay | Admitting: Family Medicine

## 2023-08-09 DIAGNOSIS — E1140 Type 2 diabetes mellitus with diabetic neuropathy, unspecified: Secondary | ICD-10-CM | POA: Diagnosis not present

## 2023-08-09 DIAGNOSIS — Z794 Long term (current) use of insulin: Secondary | ICD-10-CM | POA: Diagnosis not present

## 2023-08-09 DIAGNOSIS — L84 Corns and callosities: Secondary | ICD-10-CM

## 2023-08-10 ENCOUNTER — Telehealth: Payer: Self-pay

## 2023-08-10 NOTE — Progress Notes (Signed)
   08/10/2023  Patient ID: Danny Fuller, male   DOB: [birth year], 48 y.o.   MRN: [id number]  Attempted to contact patient to reschedule missed appt for TNM diabetes management. Unable to leave voicemail at this time.   [name], PharmD Clinical Pharmacist [phone]

## 2023-08-11 NOTE — Progress Notes (Signed)
 Subjective:   Patient ID: Danny Fuller, male   DOB: 48 y.o.   MRN: [id number]   HPI Chief Complaint  Patient presents with   Foot Ulcer    RM#13 Patient states has some callus build up causing some tenderness.      48 year old male presents the office today for follow-up evaluation of ulceration of the calluses bilaterally.  States the calluses get thick and it causes him to walk differently.  He states on the skin started to peel off on the left heel so he removed it himself.  It did not cause any bleeding.  Denies any d  Last A1c was 9.7 on May 10, 2023     Objective:  Physical Exam  General: AAO x3, NAD  Dermatological: Thick hyperkeratotic lesions noted to the left heel as well as right submetatarsal 1.  Also along the area laterally on the left foot on the previous amputation is a thick hyperkeratotic lesion with some dried blood present.  Once I debrided this there is no underlying ulceration but the areas are preulcerative.  There is no drainage or pus noted today there is no active bleeding.  There is no increased temperature noted bilaterally.  There is no areas of fluctuation or crepitation.  Vascular: Dorsalis Pedis artery and Posterior Tibial artery pedal pulses are palpable bilateral with immedate capillary fill time. Pedal hair growth present. No varicosities and no lower extremity edema present bilateral. There is no pain with calf compression, swelling, warmth, erythema.   Neruologic: Sensation decreased  Musculoskeletal: Left transmetatarsal amputation.  Prominent submetatarsal 1 right foot.  He gets tenderness on the hyperkeratotic lesions.      Assessment:   Bilateral preulcerative calluses    Plan:  -Treatment options discussed including all alternatives, risks, and complications -Etiology of symptoms were discussed -Sharply debrided hyperkeratotic lesions x 3.  Areas are preulcerative bilaterally.  There is a minimal dried blood present underneath the  callus on the left foot on the heel.  There is no skin breakdown.  Discussed moisturizer and continue.  Shoes, insoles to help decrease pressure.  Moisturizer.  -Daily foot inspection -Monitor for any clinical signs or symptoms of infection and directed to call the office immediately should any occur or go to the ER.  Return in about 3 weeks (around 08/30/2023) for pre-ulcerative calluses.  Danny Fuller DPM

## 2023-08-13 ENCOUNTER — Encounter (HOSPITAL_COMMUNITY): Payer: Self-pay

## 2023-08-22 ENCOUNTER — Ambulatory Visit (INDEPENDENT_AMBULATORY_CARE_PROVIDER_SITE_OTHER): Admitting: Neurology

## 2023-08-22 ENCOUNTER — Encounter: Payer: Self-pay | Admitting: Neurology

## 2023-08-22 VITALS — BP 183/115 | HR 96 | Ht 70.5 in | Wt 241.0 lb

## 2023-08-22 DIAGNOSIS — E1142 Type 2 diabetes mellitus with diabetic polyneuropathy: Secondary | ICD-10-CM

## 2023-08-22 NOTE — Patient Instructions (Signed)
Please follow up with your primary care doctor for blood pressure management.

## 2023-08-22 NOTE — Progress Notes (Signed)
 Follow-up Visit   Date: 08/22/23    Danny Fuller MRN: [id number] DOB: 23-Jan-1976   Interim History: Danny Fuller is a 48 y.o. 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  The patient was accompanied to the clinic by self.  He completed neck PT which has resolved his right arm shooting pain. He no longer has tingling or pain.    He feels that his Lyrica  is not as effective as it previously was.  His is still able to tolerate pain.  He takes Lyrica  150mg  three times daily and nortriptyline  40mg  at bedtime.  He denies any new numbness/tingling or falls.  His balance has improved with PT.   His blood pressure is very high today 190/117, rechecked at 183/115.  He says that he has been like this for sometime.  He denies chest pain, shortness of breath, or new headache.   Medications:  Current Outpatient Medications on File Prior to Visit  Medication Sig Dispense Refill   amLODipine -olmesartan  (AZOR ) 10-40 MG tablet Take 1 tablet by mouth daily. Please call office to schedule an appt for further refills. Thank you 30 tablet 0   apixaban  (ELIQUIS ) 5 MG TABS tablet Take 1 tablet (5 mg total) by mouth 2 (two) times daily. Past due for appt [phone] 60 tablet 0   cloNIDine  (CATAPRES  - DOSED IN MG/24 HR) 0.3 mg/24hr patch APPLY 1 PATCH TOPICALLY TO SKIN  ONCE WEEKLY 4 patch 11   Continuous Glucose Receiver (FREESTYLE LIBRE 3 READER) DEVI 1 each by Does not apply route daily. Use to check blood glucose continuously 1 each 0   Continuous Glucose Sensor (FREESTYLE LIBRE 3 PLUS SENSOR) MISC USE TO MONITOR BLOOD GLUCOSE  CONTINOUSLY. CHANGE SENSOR EVERY 15 DAYS 7 each 2   cyclobenzaprine  (FLEXERIL ) 10 MG tablet Take 1 tablet (10 mg total) by mouth 3 (three) times daily as needed for muscle spasms. 30 tablet 0   Dulaglutide  (TRULICITY ) 3  MG/0.5ML SOAJ Inject 3 mg as directed once a week.     furosemide  (LASIX ) 20 MG tablet      glucose blood test strip 1 each by Other route 4 (four) times daily.      HUMALOG  100 UNIT/ML injection Inject into the skin as directed.     hydrALAZINE  (APRESOLINE ) 25 MG tablet Take 1 tablet (25 mg total) by mouth 3 (three) times daily. 180 tablet 2   HYDROcodone -acetaminophen  (NORCO) 10-325 MG tablet TAKE 1 TABLET BY MOUTH EVERY 4 TO 6 HOURS FOR 5 DAYS AS NEEDED     insulin  aspart (NOVOLOG ) 100 UNIT/ML injection      Insulin  Disposable Pump (OMNIPOD DASH PODS, GEN 4,) MISC INJECT 1 EACH INTO THE SKIN CONTINUOUS. APPLY 1 OMNIPOD INSULIN  PUMP TO BODY DAILY FOR INSULIN  30 each 1   Insulin  Pen Needle 31G X 5 MM MISC 1 each by Other route daily.      labetalol  (NORMODYNE ) 100 MG tablet 100 mg.     nortriptyline  (PAMELOR ) 10 MG capsule TAKE 4 CAPSULES BY MOUTH AT BEDTIME 360 capsule 3   pravastatin  (PRAVACHOL ) 40 MG tablet TAKE 1 TABLET BY MOUTH EVERY  EVENING 100 tablet 0   pregabalin  (LYRICA ) 150 MG capsule Take 1 capsule (150 mg total) by mouth 3 (three) times daily. 90 capsule 5   spironolactone (ALDACTONE) 25 MG tablet Take 25 mg by mouth daily.  traMADol  (ULTRAM ) 50 MG tablet Take 2 tablets (100 mg total) by mouth every 6 (six) hours as needed for moderate pain. 360 tablet 1   traMADol  (ULTRAM ) 50 MG tablet Take 2 tablets (100 mg total) by mouth every 6 (six) hours as needed. (Patient not taking: Reported on 08/22/2023) 240 tablet 1   No current facility-administered medications on file prior to visit.    Allergies:  Allergies  Allergen Reactions   Augmentin  [Amoxicillin -Pot Clavulanate] Itching and Other (See Comments)    Dizziness   Vancomycin  Other (See Comments)    AKI    Vital Signs:  BP (!) 183/115   Pulse 96   Ht 5' 10.5 (1.791 m)   Wt 241 lb (109.3 kg)   SpO2 99%   BMI 34.09 kg/m   Neurological Exam: MENTAL STATUS including orientation to time, place, person, recent and  remote memory, attention span and concentration, language, and fund of knowledge is normal.  Speech is not dysarthric.  CRANIAL NERVES:  Normal conjugate, extra-ocular eye movements in all directions of gaze.  No ptosis.  MOTOR:  Motor strength is 5/5 in all extremities, except left finger abductors 4/5, right toe extensors and flexors 4/5. s/p left mid-foot amputation  MSRs:  Reflexes are 2+/4 in the upper extremities, 1+ at the left patella, and absent left patella and ankles  SENSORY:  Vibration is absent below the ankles  COORDINATION/GAIT:  Gait appears mildly wide-based, stable.    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  Overall, these findings are severe in degree electrically. 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  NCS/EMG of the upper extremities 07/31/2017:  The electrophysiologic findings are most suggestive of a sensorimotor polyradiculoneuropathy affecting the upper extremities, moderate in degree electrically, which is new when compared to his previous study dated 12/31/2016. Alternatively, progression of sensorimotor demyelinating and axonal polyneuropathy with a superimposed ulnar neuropathy across the elbow (worse on the left), cannot be excluded. Correlate clinically.  MRI lumbar spine wo contrast 08/22/2017: 1. 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 The left foramen also appears mildly narrowed. 2. Mild left foraminal narrowing at L5-S1 without definite nerve root encroachment. 3. No other significant acquired disc space findings. Congenitally short pedicles.  Lab Results  Component Value Date    HGBA1C 8.2 (A) 05/18/2022    Lab Results  Component Value Date   CREATININE 1.16 05/21/2023   BUN 20 05/21/2023   NA 138 05/21/2023   K 4.1 05/21/2023   CL 103 05/21/2023   CO2 24 05/21/2023    IMPRESSION/PLAN: 1.  Right arm paresthesias, possibly C7 radiculopathy.  Resolved with neck PT.  2.  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 HbA1c 8.2  Neuropathy is stable.  - Continue Lyrica  150mg  TID  - Continue nortriptyline  40mg  at bedtime   3. 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  Elevated blood pressure with history of refractory hypertension.  This seems to be chronic issue, he is asymptomatic.  Recommended that patient follow-up with PCP.  Return to clinic in 6 months   Thank you for allowing me to participate in patient's care.  If  I can answer any additional questions, I would be pleased to do so.    Sincerely,    Zaynah Chawla K. Tobie, DO

## 2023-08-23 ENCOUNTER — Ambulatory Visit (HOSPITAL_COMMUNITY)
Admission: RE | Admit: 2023-08-23 | Discharge: 2023-08-23 | Disposition: A | Discharge status: Home / Self Care | Source: Ambulatory Visit | Attending: Physician Assistant | Admitting: Physician Assistant

## 2023-08-23 ENCOUNTER — Encounter (HOSPITAL_COMMUNITY): Payer: Self-pay | Admitting: Physician Assistant

## 2023-08-23 VITALS — BP 164/98 | HR 89 | Ht 70.5 in | Wt 240.0 lb

## 2023-08-23 DIAGNOSIS — D6869 Other thrombophilia: Secondary | ICD-10-CM | POA: Diagnosis not present

## 2023-08-23 DIAGNOSIS — I480 Paroxysmal atrial fibrillation: Secondary | ICD-10-CM

## 2023-08-23 NOTE — Progress Notes (Signed)
 Primary Care Physician: Johnny Garnette LABOR, MD Primary Cardiologist: Dr Lavona  Primary Electrophysiologist: none Referring Physician: J[facility]   Danny Fuller Counter is a 48 y.o. male with a history of HTN, DM, HLD, OSA, atrial fibrillation who presents for follow up in the [facility].  The patient was initially diagnosed with atrial fibrillation 10/21/21 after presenting to the ED with symptoms of weakness, fatigue, and palpitations. ECG showed afib with RVR. He spontaneously converted to SR. Patient was started on Eliquis  for stroke prevention and his labetalol  was increased. Patient decreased his labetalol  back to 200 mg BID due to dizziness.   Patient returns for follow up for atrial fibrillation. He reports that he has done well with no interim symptoms of afib. No bleeding issues on anticoagulation. He is not using his CPAP.   Today, he  denies symptoms of palpitations, chest pain, shortness of breath, orthopnea, PND, lower extremity edema, dizziness, presyncope, syncope, bleeding, or neurologic sequela. The patient is tolerating medications without difficulties and is otherwise without complaint today.    Atrial Fibrillation Risk Factors:  he does have symptoms or diagnosis of sleep apnea. he does not have a history of rheumatic fever. he does not have a history of alcohol use.   Atrial Fibrillation Management history:  Previous antiarrhythmic drugs: none Previous cardioversions: none Previous ablations: none Anticoagulation history: Eliquis    Past Medical History:  Diagnosis Date   Asthma    Back pain    Bulging Disk   Chronic kidney disease    Diabetes mellitus    sees Warren Batty NP at [facility]   Diabetic foot ulcers [facility])    sees Dr. Norleen Armor    GERD (gastroesophageal reflux disease)    Glaucoma    Headache(784.0)    Hyperlipidemia    Hypertension    sees Dr. Lavona    Sleep apnea    CPAP    Current  Outpatient Medications  Medication Sig Dispense Refill   amLODipine -olmesartan  (AZOR ) 10-40 MG tablet Take 1 tablet by mouth daily. Please call office to schedule an appt for further refills. Thank you 30 tablet 0   apixaban  (ELIQUIS ) 5 MG TABS tablet Take 1 tablet (5 mg total) by mouth 2 (two) times daily. Past due for appt [phone] 60 tablet 0   cloNIDine  (CATAPRES  - DOSED IN MG/24 HR) 0.3 mg/24hr patch APPLY 1 PATCH TOPICALLY TO SKIN  ONCE WEEKLY 4 patch 11   Continuous Glucose Receiver (FREESTYLE LIBRE 3 READER) DEVI 1 each by Does not apply route daily. Use to check blood glucose continuously 1 each 0   Continuous Glucose Sensor (FREESTYLE LIBRE 3 PLUS SENSOR) MISC USE TO MONITOR BLOOD GLUCOSE  CONTINOUSLY. CHANGE SENSOR EVERY 15 DAYS 7 each 2   cyclobenzaprine  (FLEXERIL ) 10 MG tablet Take 1 tablet (10 mg total) by mouth 3 (three) times daily as needed for muscle spasms. 30 tablet 0   Dulaglutide  (TRULICITY ) 3 MG/0.5ML SOAJ Inject 3 mg as directed once a week.     furosemide  (LASIX ) 20 MG tablet      glucose blood test strip 1 each by Other route 4 (four) times daily.      HUMALOG  100 UNIT/ML injection Inject into the skin as directed.     hydrALAZINE  (APRESOLINE ) 25 MG tablet Take 1 tablet (25 mg total) by mouth 3 (three) times daily. 180 tablet 2   HYDROcodone -acetaminophen  (NORCO) 10-325 MG tablet TAKE 1 TABLET BY MOUTH EVERY 4 TO  6 HOURS FOR 5 DAYS AS NEEDED     insulin  aspart (NOVOLOG ) 100 UNIT/ML injection      Insulin  Disposable Pump (OMNIPOD DASH PODS, GEN 4,) MISC INJECT 1 EACH INTO THE SKIN CONTINUOUS. APPLY 1 OMNIPOD INSULIN  PUMP TO BODY DAILY FOR INSULIN  30 each 1   Insulin  Pen Needle 31G X 5 MM MISC 1 each by Other route daily.      labetalol  (NORMODYNE ) 100 MG tablet 100 mg.     nortriptyline  (PAMELOR ) 10 MG capsule TAKE 4 CAPSULES BY MOUTH AT BEDTIME 360 capsule 3   pravastatin  (PRAVACHOL ) 40 MG tablet TAKE 1 TABLET BY MOUTH EVERY  EVENING 100 tablet 0   pregabalin  (LYRICA )  150 MG capsule Take 1 capsule (150 mg total) by mouth 3 (three) times daily. 90 capsule 5   spironolactone (ALDACTONE) 25 MG tablet Take 25 mg by mouth daily.     traMADol  (ULTRAM ) 50 MG tablet Take 2 tablets (100 mg total) by mouth every 6 (six) hours as needed for moderate pain. 360 tablet 1   traMADol  (ULTRAM ) 50 MG tablet Take 2 tablets (100 mg total) by mouth every 6 (six) hours as needed. 240 tablet 1   No current facility-administered medications for this encounter.    ROS- All systems are reviewed and negative except as per the HPI above.  Physical Exam: Vitals:   08/23/23 0940  BP: (!) 164/98  Pulse: 89  Weight: 108.9 kg  Height: 5' 10.5 (1.791 m)    GEN: Well nourished, well developed in no acute distress CARDIAC: Regular rate and rhythm, no murmurs, rubs, gallops RESPIRATORY:  Clear to auscultation without rales, wheezing or rhonchi  ABDOMEN: Soft, non-tender, non-distended EXTREMITIES:  No edema; No deformity    Wt Readings from Last 3 Encounters:  08/23/23 108.9 kg  08/22/23 109.3 kg  05/22/23 112 kg    EKG today demonstrates  SR, LPFB Vent. rate 89 BPM PR interval 156 ms QRS duration 92 ms QT/QTcB 346/420 ms   Echo 12/14/21 demonstrated   1. Strain attempted. Tracking was not accurate in all segments to provide  accurate number, but overall global longitudinal strain appears mildly  decreased. Would consider MRI to further evaluate myocardium/myocardial  performance.. Left ventricular ejection fraction, by estimation, is 60 to 65%. The left ventricle has normal function. The left ventricle has no regional wall motion abnormalities. There is severe concentric left ventricular hypertrophy. Left ventricular diastolic parameters are  consistent with Grade I diastolic dysfunction (impaired relaxation).   2. Right ventricular systolic function is normal. The right ventricular  size is normal. There is normal pulmonary artery systolic pressure.   3. The mitral  valve is normal in structure. Mild mitral valve  regurgitation.   4. The aortic valve is normal in structure. Aortic valve regurgitation is  not visualized.   5. The inferior vena cava is normal in size with greater than 50%  respiratory variability, suggesting right atrial pressure of 3 mmHg.    Epic records are reviewed at length today  CHA2DS2-VASc Score = 2  The patient's score is based upon: CHF History: 0 HTN History: 1 Diabetes History: 1 Stroke History: 0 Vascular Disease History: 0 Age Score: 0 Gender Score: 0       ASSESSMENT AND PLAN: Paroxysmal Atrial Fibrillation (ICD10:  I48.0) The patient's CHA2DS2-VASc score is 2, indicating a 2.2% annual risk of stroke.   Patient appears to be maintaining SR Continue Eliquis  5 mg BID Continue labetalol  100 mg daily  Secondary Hypercoagulable State (ICD10:  D68.69) The patient is at significant risk for stroke/thromboembolism based upon his CHA2DS2-VASc Score of 2.  Continue Apixaban  (Eliquis ). No bleeding issues.   Obesity Body mass index is 33.95 kg/m.  Has lost a considerable amount of weight over the years, previously 370 lbs. Encouraged lifestyle modification   OSA  Repeat sleep study 01/03/22 showed mild OSA He returned his CPAP, not interested in resuming.   HTN Elevated today, historically difficult to control. We discussed potential adverse events of uncontrolled HTN and findings of severe LVH on echo.  His nephrologist and PCP have been adjusting his medications, defer further changes to them.     Follow up in the AF clinic as needed. Patient is overdue for follow up with Dr Lavona, will request appointment.    Daril Kicks PA-C Afib Clinic [facility] [address] [place], [place] [address] [phone] 08/23/2023 9:45 AM

## 2023-08-24 ENCOUNTER — Other Ambulatory Visit (HOSPITAL_COMMUNITY): Payer: Self-pay | Admitting: Physician Assistant

## 2023-09-03 ENCOUNTER — Ambulatory Visit (INDEPENDENT_AMBULATORY_CARE_PROVIDER_SITE_OTHER): Admitting: Podiatry

## 2023-09-03 ENCOUNTER — Encounter: Payer: Self-pay | Admitting: Podiatry

## 2023-09-03 DIAGNOSIS — E1140 Type 2 diabetes mellitus with diabetic neuropathy, unspecified: Secondary | ICD-10-CM | POA: Diagnosis not present

## 2023-09-03 DIAGNOSIS — Z794 Long term (current) use of insulin: Secondary | ICD-10-CM

## 2023-09-03 DIAGNOSIS — M79675 Pain in left toe(s): Secondary | ICD-10-CM

## 2023-09-03 DIAGNOSIS — M79674 Pain in right toe(s): Secondary | ICD-10-CM

## 2023-09-03 DIAGNOSIS — L84 Corns and callosities: Secondary | ICD-10-CM | POA: Diagnosis not present

## 2023-09-03 DIAGNOSIS — B351 Tinea unguium: Secondary | ICD-10-CM

## 2023-09-04 NOTE — Progress Notes (Signed)
 Subjective:   Patient ID: Danny Fuller, male   DOB: 47 y.o.   MRN: [id number]   HPI Chief Complaint  Patient presents with   Foot Ulcer    pre-ulcerative calluses Right foot sub met 1 and 5. Left foot heel ulcer. 5 pain. Bag balm ointment for wounds. IDDM A1C 31.56.     48 year old male presents the office today for follow-up evaluation of pre-ulcerative calluses bilaterally.  He states that as they get thick dystrophic cause pain he walks differently.  He does not report any open lesions or any drainage.  No concerns or chills.  Also states he needs his nails trimmed as they are thickened elongated and they cause discomfort.  Last A1c was 9.7 on May 10, 2023     Objective:  Physical Exam  General: AAO x3, NAD  Dermatological: Thick hyperkeratotic lesions noted to the left heel as well as right submetatarsal 1.  After debridement there is no underlying ulceration, drainage or any signs of infection but the areas are preulcerative.  There is no dried blood.  Hypertrophic, dystrophic and elongated nails 1 through 5 on the right foot. Tenderness to nails 1-5 on the right. No open lesions.   Vascular: Dorsalis Pedis artery and Posterior Tibial artery pedal pulses are palpable bilateral with immedate capillary fill time. Pedal hair growth present. No varicosities and no lower extremity edema present bilateral. There is no pain with calf compression, swelling, warmth, erythema.   Neruologic: Sensation decreased  Musculoskeletal: Left transmetatarsal amputation.  Prominent submetatarsal 1 right foot.  He gets tenderness on the hyperkeratotic lesions.      Assessment:   Bilateral preulcerative calluses; symptomatic onychomycosis     Plan:  -Treatment options discussed including all alternatives, risks, and complications -Etiology of symptoms were discussed -Sharply debrided hyperkeratotic lesions x 2.  Areas are preulcerative bilaterally.  Continue moisturizer, offloading. -Sharply  debrided nails x 5 without any complications or bleeding. -Daily foot inspection -Monitor for any clinical signs or symptoms of infection and directed to call the office immediately should any occur or go to the ER.  Return in about 3 weeks (around 09/24/2023).  [name] DPM

## 2023-09-14 ENCOUNTER — Other Ambulatory Visit

## 2023-09-14 ENCOUNTER — Telehealth: Payer: Self-pay

## 2023-09-14 NOTE — Progress Notes (Signed)
   09/14/2023  Patient ID: Danny Fuller Counter, male   DOB: 02/28/1975, 48 y.o.   MRN: [id number]  Contacted patient via telephone to follow up on diabetes management. Patient asked for callback at 1pm. Attempted to call at scheduled time x 2, unable to leave voicemail.  [name], PharmD Clinical Pharmacist [phone]

## 2023-09-24 ENCOUNTER — Ambulatory Visit (INDEPENDENT_AMBULATORY_CARE_PROVIDER_SITE_OTHER): Admitting: Podiatry

## 2023-09-24 DIAGNOSIS — E1140 Type 2 diabetes mellitus with diabetic neuropathy, unspecified: Secondary | ICD-10-CM | POA: Diagnosis not present

## 2023-09-24 DIAGNOSIS — L84 Corns and callosities: Secondary | ICD-10-CM

## 2023-09-24 DIAGNOSIS — Z794 Long term (current) use of insulin: Secondary | ICD-10-CM | POA: Diagnosis not present

## 2023-09-24 NOTE — Patient Instructions (Signed)
 Monitor for any signs/symptoms of infection. Call the office immediately if any occur or go directly to the emergency room. Call with any questions/concerns.

## 2023-09-24 NOTE — Progress Notes (Signed)
 Subjective:   Patient ID: Danny Fuller, male   DOB: 48 y.o.   MRN: [id number]   HPI Chief Complaint  Patient presents with   Pre-ulcerative calluses    Pt stated that things are going okay     48 year old male presents the office today for follow-up evaluation of pre-ulcerative calluses bilaterally.  He states that couple days ago in the rain his shoe and sock got wet and he noticed some skin turning white and started peeling the right foot and he was concerned about he has not seen any drainage or pus.  No increase in swelling from baseline to either his feet no redness or warmth that he reports.  Does not have any fevers or chills or other concerns today.    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  Last A1c was 9.7 on May 10, 2023     Objective:  Physical Exam  General: AAO x3, NAD  Dermatological: Thick hyperkeratotic lesions noted to the left heel as well as right submetatarsal 1.  On the left heel today there is more dried blood present under the callus but there is no definitive skin breakdown but the areas are quite preulcerative.  On the right foot there was some macerated tissue under the callus and against the ulcer.  Both of the lesions do appear to be thicker compared to previous there is no drainage or pus.  No surrounding erythema, ascending cellulitis.  No fluctuation medication.  No malodor.  Vascular: Dorsalis Pedis artery and Posterior Tibial artery pedal pulses are palpable bilateral with immedate capillary fill time. Pedal hair growth present. No varicosities and no lower extremity edema present bilateral. There is no pain with calf compression, swelling, warmth, erythema.   Neruologic: Sensation decreased  Musculoskeletal: Left transmetatarsal amputation.  Prominent submetatarsal 1 right foot.  He gets tenderness on the hyperkeratotic lesions.      Assessment:   Bilateral preulcerative calluses; symptomatic onychomycosis      Plan:  -Treatment options discussed including all alternatives, risks, and complications -Etiology of symptoms were discussed -Sharply debrided hyperkeratotic lesions x 2.  Areas are preulcerative bilaterally.  They seem to be thicker today.  Small amount of Betadine applied to the ulcerated area right foot submetatarsal 1.  - Discussed need to monitor closely for any signs or symptoms of any worsening or infection and discussed trying to limit his activity. - Monitor for any clinical signs or symptoms of infection and directed to call the office immediately should any occur or go to the ER.  Return in about 3 weeks (around 10/15/2023) for pre-ulcerative calluses.  [name] DPM

## 2023-10-01 ENCOUNTER — Telehealth: Payer: Self-pay | Admitting: Pharmacy Technician

## 2023-10-01 NOTE — Progress Notes (Signed)
   10/01/2023  Patient ID: Danny Fuller, male   DOB: 03/28/1975, 48 y.o.   MRN: [id number]  Patient engaged with clinical pharmacist for management of diabetes on 06/01/2023. Outreach by [employer] technician was requested.   Outreached patient to discuss diabetes medication management. Voicemail unable to be left as message came on stating voicemail box not set up yet.  [name], CPhT [facility] Office: [phone] Email: [email or web address]

## 2023-10-03 ENCOUNTER — Telehealth: Payer: Self-pay | Admitting: Pharmacy Technician

## 2023-10-03 NOTE — Progress Notes (Signed)
   10/03/2023  Patient ID: Danny Fuller Counter, male   DOB: 1975/12/05, 48 y.o.   MRN: [id number]  Patient engaged with clinical pharmacist for management of diabetes on 06/01/2023. Outreach by [employer] technician was requested.   Outreached patient to discuss diabetes medication management. Received message stating voicemail box is not set up.  Gissella Niblack, CPhT [facility] Office: [phone] Email: [email or web address]

## 2023-10-05 ENCOUNTER — Telehealth: Payer: Self-pay | Admitting: Pharmacy Technician

## 2023-10-05 NOTE — Progress Notes (Signed)
   10/05/2023  Patient ID: Pearley KATHEE Counter, male   DOB: Oct 29, 1975, 48 y.o.   MRN: [id number]  Patient engaged with clinical pharmacist for management of diabetes on 06/01/2023. Outreach by [employer] technician was requested.   Outreached patient to discuss diabetes medication management. Received message stating voicemail box not set up. Will send patient mychart message.  Tavaughn Silguero, CPhT [facility] Office: [phone] Email: [email or web address]

## 2023-10-09 DIAGNOSIS — E118 Type 2 diabetes mellitus with unspecified complications: Secondary | ICD-10-CM | POA: Insufficient documentation

## 2023-10-09 NOTE — Progress Notes (Deleted)
 Cardiology Office Note:   Date:  10/09/2023  ID:  Danny Fuller, DOB July 27, 1975, MRN [id number] PCP: Danny Garnette LABOR, MD  [facility] Providers Cardiologist:  Lynwood Schilling, MD {  History of Present Illness:   Danny Fuller is a 48 y.o. male who was referred by Dr. Betsey years ago for evaluation of palpitations.  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 He had a cardiac event monitor that demonstrated rare PVCs.  He had an echo with normal LV function although there was some moderate pulmonary HTN.   However, echo 2023 did not demonstrate pulmonary HTN.   He had chest pain in 2022 with no ischemia.  He has had atrial fib and has been followed in the Atrial Fib Clinic.     ***    ***  He had atrial fib earlier this month and was in the ED.  He was seen in the A Fib Clinic.  He said that it started suddenly.  It was rapid rate with shortness of breath and some dizziness.  He did not describe chest discomfort.  He has not had it before or since.  He did not think there was a particular trigger.  He had a little caffeine that day.  He denies any ongoing chest pressure, neck or arm discomfort.  Has had no new shortness of breath, PND or orthopnea.   He otherwise has not had any recent problems.  He denies any palpitations, presyncope or syncope.  He has had no new chest pressure, neck or arm discomfort.  He has had no weight gain or edema.  He did have to have all of the toes of his left foot amputated since I last saw him because of nonhealing ulcers.  He has since checked his blood sugar much better under control.  ROS: ***  Studies Reviewed:    EKG:       ***  Risk Assessment/Calculations:   {Does this patient have ATRIAL FIBRILLATION?:[phone]} No BP recorded.  {Refresh Note OR Click here to enter BP  :1}***   STOP-Bang Score:     { Consider Dx Sleep Disordered  Breathing or Sleep Apnea  ICD G47.33          :1}     Physical Exam:   VS:  There were no vitals taken for this visit.   Wt Readings from Last 3 Encounters:  08/23/23 240 lb (108.9 kg)  08/22/23 241 lb (109.3 kg)  05/22/23 247 lb (112 kg)     GEN: Well nourished, well developed in no acute distress NECK: No JVD; No carotid bruits CARDIAC: ***RR, *** murmurs, rubs, gallops RESPIRATORY:  Clear to auscultation without rales, wheezing or rhonchi  ABDOMEN: Soft, non-tender, non-distended EXTREMITIES:  No edema; No deformity   ASSESSMENT AND PLAN:   ATRIAL FIB:  ***  Patient had 1 episode of paroxysmal atrial fibrillation.  He is on appropriate anticoagulation and tolerating this.  We did talk about potentially taking an extra 50 mg of labetalol  if he is in a bout of rapid atrial fibrillation.  For now I do not think he needs an antiarrhythmic or consideration of ablation given this was the first event.  I am however going to check an echocardiogram.   CHEST PAIN:    ***  He has had no further chest discomfort.  No change in therapy.   HTN:  The blood pressure is ***  elevated today but this is unusual.  No change in therapy.  He did not tolerate any increase in his beta-blocker.    DM: His A1c is *** 10.0.  He needs to have close follow-up with Danny Garnette LABOR, MD   DYSLIPIDEMIA:   His last LDL was ***  66 with an HDL of 34 in August.  No change in therapy.    SLEEP APNEA:   ***  He had mild sleep apnea before and was supposed to get CPAP but COVID intervened.  I am going to send him back for another sleep study so we can get a new prescription for CPAP.    PULMONARY HTN: I will check this at the time of his echocardiogram.     Follow up ***  Signed, Lynwood Schilling, MD

## 2023-10-11 ENCOUNTER — Ambulatory Visit: Attending: Cardiology | Admitting: Cardiology

## 2023-10-11 DIAGNOSIS — I10 Essential (primary) hypertension: Secondary | ICD-10-CM

## 2023-10-11 DIAGNOSIS — E118 Type 2 diabetes mellitus with unspecified complications: Secondary | ICD-10-CM

## 2023-10-11 DIAGNOSIS — E785 Hyperlipidemia, unspecified: Secondary | ICD-10-CM

## 2023-10-11 DIAGNOSIS — G4730 Sleep apnea, unspecified: Secondary | ICD-10-CM

## 2023-10-11 DIAGNOSIS — I480 Paroxysmal atrial fibrillation: Secondary | ICD-10-CM

## 2023-10-12 ENCOUNTER — Telehealth: Payer: Self-pay

## 2023-10-12 NOTE — Progress Notes (Addendum)
   10/12/2023  Patient ID: Danny Fuller, male   DOB: 1975/12/29, 48 y.o.   MRN: [id number]  Pharmacy Quality Measure Review  This patient is appearing on a report for being at risk of failing the adherence measure for hypertension (ACEi/ARB) medications this calendar year.   Medication: Amlodipine -Olmesartan  10-40mg  Last fill date: 06/24/23 for 30 day supply  Attempted to contact patient. Unable to leave voicemail.  Chart review shows he is past due for a follow up with cardio in order to get a refill. Appointment scheduled with cardio for 8/28 was not completed.   [name], PharmD Clinical Pharmacist [phone]

## 2023-10-19 ENCOUNTER — Encounter: Payer: Self-pay | Admitting: Family Medicine

## 2023-10-19 ENCOUNTER — Ambulatory Visit (INDEPENDENT_AMBULATORY_CARE_PROVIDER_SITE_OTHER): Admitting: Family Medicine

## 2023-10-19 VITALS — BP 186/98 | HR 96 | Temp 98.5°F | Wt 233.0 lb

## 2023-10-19 DIAGNOSIS — I10 Essential (primary) hypertension: Secondary | ICD-10-CM | POA: Diagnosis not present

## 2023-10-19 DIAGNOSIS — Z794 Long term (current) use of insulin: Secondary | ICD-10-CM | POA: Diagnosis not present

## 2023-10-19 DIAGNOSIS — E1140 Type 2 diabetes mellitus with diabetic neuropathy, unspecified: Secondary | ICD-10-CM

## 2023-10-19 LAB — POCT GLYCOSYLATED HEMOGLOBIN (HGB A1C): Hemoglobin A1C: 9.8 % — AB (ref 4.0–5.6)

## 2023-10-19 MED ORDER — HYDRALAZINE HCL 50 MG PO TABS: 50.0000 mg | ORAL_TABLET | Freq: Three times a day (TID) | ORAL | 5 refills | Status: DC

## 2023-10-19 MED ORDER — CYCLOBENZAPRINE HCL 10 MG PO TABS: 10.0000 mg | ORAL_TABLET | Freq: Three times a day (TID) | ORAL | 5 refills | Status: AC | PRN

## 2023-10-19 MED ORDER — LABETALOL HCL 100 MG PO TABS: 100.0000 mg | ORAL_TABLET | Freq: Every day | ORAL | 5 refills | Status: DC

## 2023-10-19 NOTE — Progress Notes (Signed)
   Subjective:    Patient ID: Danny Fuller, male    DOB: Jun 17, 1975, 48 y.o.   MRN: [id number]  HPI Here to follow up on HTN. He has been feeling very fatigued lately ,and he thinks his BP is high. No chest pain or SOB. No ankle swelling. He admits that he has run out of several of his medications. His A1c today is 9.8%. he sees Warren Batty NP at [facility] for his diabetes.    Review of Systems  Constitutional:  Positive for fatigue.  Respiratory: Negative.    Cardiovascular: Negative.   Neurological: Negative.        Objective:   Physical Exam Constitutional:      Appearance: Normal appearance.  Cardiovascular:     Rate and Rhythm: Normal rate and regular rhythm.     Pulses: Normal pulses.     Heart sounds: Normal heart sounds.  Pulmonary:     Effort: Pulmonary effort is normal.     Breath sounds: Normal breath sounds.  Musculoskeletal:     Right lower leg: No edema.     Left lower leg: No edema.  Neurological:     Mental Status: He is alert.           Assessment & Plan:  His HTN and his diabetes are out of control. He will follow up with his Endocrinologist for the diabetes. For the HTN, we will restart his Labetalol  100 mg daily and we will increase the Hydralazine  to 50 mg TID. Recheck in 3 weeks.  Garnette Olmsted, MD

## 2023-10-22 ENCOUNTER — Ambulatory Visit (INDEPENDENT_AMBULATORY_CARE_PROVIDER_SITE_OTHER): Admitting: Podiatry

## 2023-10-22 DIAGNOSIS — Z794 Long term (current) use of insulin: Secondary | ICD-10-CM

## 2023-10-22 DIAGNOSIS — E1140 Type 2 diabetes mellitus with diabetic neuropathy, unspecified: Secondary | ICD-10-CM | POA: Diagnosis not present

## 2023-10-22 DIAGNOSIS — L84 Corns and callosities: Secondary | ICD-10-CM

## 2023-10-22 MED ORDER — DOXYCYCLINE HYCLATE 100 MG PO TABS: 100.0000 mg | ORAL_TABLET | Freq: Two times a day (BID) | ORAL | 0 refills | Status: DC

## 2023-10-22 MED ORDER — SILVER SULFADIAZINE 1 % EX CREA: 1.0000 | TOPICAL_CREAM | Freq: Every day | CUTANEOUS | 2 refills | Status: DC

## 2023-10-22 NOTE — Progress Notes (Signed)
 Subjective:   Patient ID: Pearley KATHEE Counter, male   DOB: 48 y.o.   MRN: [id number]   HPI Chief Complaint  Patient presents with   Callouses    Pt stated that he is doing okay he stated that the spot did have some light bleeding the other day     48 year old male presents the office today for follow-up evaluation of pre-ulcerative calluses bilaterally.  He did recently have a fall but did not injure his feet.  He denies a small amount of blood on the bandage on the left foot.  He denies any increase in swelling or redness or any drainage or pus.  He does not report any fevers or chills.  No other treatments thus far and lastly has no other concerns today.    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  He tries stay off of his feet more recently since he noticed the drainage.  Last A1c was 9.8 on May 10, 2023     Objective:  Physical Exam  General: AAO x3, NAD  Dermatological: Thick hyperkeratotic lesions noted to the left heel as well as right submetatarsal 1.  On the right foot submetatarsal 1 there is hyperkeratotic tissue but not as significant as what it was previously.  On the left foot on the heel thick hyperkeratotic lesion and upon debridement there is areas preulcerative and 1 very small superficial area of skin breakdown.  There is no active bleeding or any drainage.  There is no purulence.  There is slight malodor but I think is coming from the buildup, tissue present underneath the callus.  There is no surrounding erythema, ascending cellulitis.  There is no areas of fluctuation or crepitation.  Vascular: Dorsalis Pedis artery and Posterior Tibial artery pedal pulses are palpable bilateral with immedate capillary fill time. Pedal hair growth present. No varicosities and no lower extremity edema present bilateral. There is no pain with calf compression, swelling, warmth, erythema.   Neruologic: Sensation decreased  Musculoskeletal: Left  transmetatarsal amputation.  Prominent submetatarsal 1 right foot.  He gets tenderness on the hyperkeratotic lesions.      Assessment:   Bilateral preulcerative calluses; symptomatic onychomycosis     Plan:  -Treatment options discussed including all alternatives, risks, and complications -Etiology of symptoms were discussed -Sharply debrided hyperkeratotic lesions x 2.  Left foot is preulcerative and there is 1 small superficial area of skin breakdown.  Prescribed Silvadene  cream.  Given malodor prescribe doxycycline .  Needs to stay off his foot and elevation. -Debrided callus on the right foot and complications of bleeding.  No skin breakdown. -Monitor for any clinical signs or symptoms of infection and directed to call the office immediately should any occur or go to the ER.  Return for left heel ulcer in 2-3 weeks.  [name] DPM

## 2023-11-03 ENCOUNTER — Emergency Department (HOSPITAL_COMMUNITY)

## 2023-11-03 ENCOUNTER — Inpatient Hospital Stay (HOSPITAL_COMMUNITY)
Admission: EM | Admit: 2023-11-03 | Discharge: 2023-11-05 | DRG: 309 | Disposition: A | Discharge status: Home / Self Care | Attending: Internal Medicine | Admitting: Internal Medicine

## 2023-11-03 ENCOUNTER — Encounter (HOSPITAL_COMMUNITY): Payer: Self-pay

## 2023-11-03 ENCOUNTER — Other Ambulatory Visit: Payer: Self-pay

## 2023-11-03 DIAGNOSIS — J45909 Unspecified asthma, uncomplicated: Secondary | ICD-10-CM | POA: Diagnosis not present

## 2023-11-03 DIAGNOSIS — Z91199 Patient's noncompliance with other medical treatment and regimen due to unspecified reason: Secondary | ICD-10-CM | POA: Diagnosis not present

## 2023-11-03 DIAGNOSIS — I672 Cerebral atherosclerosis: Secondary | ICD-10-CM | POA: Diagnosis not present

## 2023-11-03 DIAGNOSIS — T383X6A Underdosing of insulin and oral hypoglycemic [antidiabetic] drugs, initial encounter: Secondary | ICD-10-CM | POA: Diagnosis present

## 2023-11-03 DIAGNOSIS — Z91148 Patient's other noncompliance with medication regimen for other reason: Secondary | ICD-10-CM

## 2023-11-03 DIAGNOSIS — S0990XA Unspecified injury of head, initial encounter: Secondary | ICD-10-CM | POA: Diagnosis not present

## 2023-11-03 DIAGNOSIS — K5730 Diverticulosis of large intestine without perforation or abscess without bleeding: Secondary | ICD-10-CM | POA: Diagnosis not present

## 2023-11-03 DIAGNOSIS — E1122 Type 2 diabetes mellitus with diabetic chronic kidney disease: Secondary | ICD-10-CM | POA: Diagnosis not present

## 2023-11-03 DIAGNOSIS — I129 Hypertensive chronic kidney disease with stage 1 through stage 4 chronic kidney disease, or unspecified chronic kidney disease: Secondary | ICD-10-CM | POA: Diagnosis not present

## 2023-11-03 DIAGNOSIS — R55 Syncope and collapse: Secondary | ICD-10-CM | POA: Diagnosis not present

## 2023-11-03 DIAGNOSIS — F39 Unspecified mood [affective] disorder: Secondary | ICD-10-CM | POA: Diagnosis present

## 2023-11-03 DIAGNOSIS — I480 Paroxysmal atrial fibrillation: Principal | ICD-10-CM | POA: Diagnosis present

## 2023-11-03 DIAGNOSIS — I4891 Unspecified atrial fibrillation: Secondary | ICD-10-CM

## 2023-11-03 DIAGNOSIS — Z9641 Presence of insulin pump (external) (internal): Secondary | ICD-10-CM | POA: Diagnosis not present

## 2023-11-03 DIAGNOSIS — Z89422 Acquired absence of other left toe(s): Secondary | ICD-10-CM

## 2023-11-03 DIAGNOSIS — Z7985 Long-term (current) use of injectable non-insulin antidiabetic drugs: Secondary | ICD-10-CM

## 2023-11-03 DIAGNOSIS — Z794 Long term (current) use of insulin: Secondary | ICD-10-CM

## 2023-11-03 DIAGNOSIS — I959 Hypotension, unspecified: Secondary | ICD-10-CM

## 2023-11-03 DIAGNOSIS — Z8261 Family history of arthritis: Secondary | ICD-10-CM

## 2023-11-03 DIAGNOSIS — Z87891 Personal history of nicotine dependence: Secondary | ICD-10-CM | POA: Diagnosis not present

## 2023-11-03 DIAGNOSIS — E1165 Type 2 diabetes mellitus with hyperglycemia: Secondary | ICD-10-CM | POA: Diagnosis present

## 2023-11-03 DIAGNOSIS — Z6834 Body mass index (BMI) 34.0-34.9, adult: Secondary | ICD-10-CM

## 2023-11-03 DIAGNOSIS — Z880 Allergy status to penicillin: Secondary | ICD-10-CM

## 2023-11-03 DIAGNOSIS — Z8249 Family history of ischemic heart disease and other diseases of the circulatory system: Secondary | ICD-10-CM | POA: Diagnosis not present

## 2023-11-03 DIAGNOSIS — I499 Cardiac arrhythmia, unspecified: Secondary | ICD-10-CM | POA: Diagnosis not present

## 2023-11-03 DIAGNOSIS — N179 Acute kidney failure, unspecified: Secondary | ICD-10-CM | POA: Diagnosis not present

## 2023-11-03 DIAGNOSIS — Z833 Family history of diabetes mellitus: Secondary | ICD-10-CM | POA: Diagnosis not present

## 2023-11-03 DIAGNOSIS — E1140 Type 2 diabetes mellitus with diabetic neuropathy, unspecified: Secondary | ICD-10-CM | POA: Diagnosis not present

## 2023-11-03 DIAGNOSIS — E785 Hyperlipidemia, unspecified: Secondary | ICD-10-CM | POA: Diagnosis present

## 2023-11-03 DIAGNOSIS — K5900 Constipation, unspecified: Secondary | ICD-10-CM | POA: Diagnosis not present

## 2023-11-03 DIAGNOSIS — Z79899 Other long term (current) drug therapy: Secondary | ICD-10-CM

## 2023-11-03 DIAGNOSIS — Z881 Allergy status to other antibiotic agents status: Secondary | ICD-10-CM

## 2023-11-03 DIAGNOSIS — E66812 Obesity, class 2: Secondary | ICD-10-CM | POA: Diagnosis present

## 2023-11-03 DIAGNOSIS — N280 Ischemia and infarction of kidney: Secondary | ICD-10-CM | POA: Diagnosis present

## 2023-11-03 DIAGNOSIS — E1142 Type 2 diabetes mellitus with diabetic polyneuropathy: Secondary | ICD-10-CM

## 2023-11-03 DIAGNOSIS — Z823 Family history of stroke: Secondary | ICD-10-CM

## 2023-11-03 DIAGNOSIS — Z89412 Acquired absence of left great toe: Secondary | ICD-10-CM

## 2023-11-03 DIAGNOSIS — R079 Chest pain, unspecified: Secondary | ICD-10-CM | POA: Diagnosis not present

## 2023-11-03 DIAGNOSIS — Z7901 Long term (current) use of anticoagulants: Secondary | ICD-10-CM | POA: Diagnosis not present

## 2023-11-03 DIAGNOSIS — R531 Weakness: Secondary | ICD-10-CM | POA: Diagnosis not present

## 2023-11-03 DIAGNOSIS — I491 Atrial premature depolarization: Secondary | ICD-10-CM | POA: Diagnosis not present

## 2023-11-03 DIAGNOSIS — R61 Generalized hyperhidrosis: Secondary | ICD-10-CM | POA: Diagnosis not present

## 2023-11-03 DIAGNOSIS — G4733 Obstructive sleep apnea (adult) (pediatric): Secondary | ICD-10-CM | POA: Diagnosis present

## 2023-11-03 DIAGNOSIS — R42 Dizziness and giddiness: Secondary | ICD-10-CM | POA: Diagnosis not present

## 2023-11-03 DIAGNOSIS — K429 Umbilical hernia without obstruction or gangrene: Secondary | ICD-10-CM | POA: Diagnosis not present

## 2023-11-03 LAB — COMPREHENSIVE METABOLIC PANEL WITH GFR
ALT: 17 U/L (ref 0–44)
AST: 19 U/L (ref 15–41)
Albumin: 3.6 g/dL (ref 3.5–5.0)
Alkaline Phosphatase: 72 U/L (ref 38–126)
Anion gap: 14 (ref 5–15)
BUN: 17 mg/dL (ref 6–20)
CO2: 20 mmol/L — ABNORMAL LOW (ref 22–32)
Calcium: 9.1 mg/dL (ref 8.9–10.3)
Chloride: 105 mmol/L (ref 98–111)
Creatinine, Ser: 2.04 mg/dL — ABNORMAL HIGH (ref 0.61–1.24)
GFR, Estimated: 40 mL/min — ABNORMAL LOW (ref >60)
Glucose, Bld: 283 mg/dL — ABNORMAL HIGH (ref 70–99)
Potassium: 5.1 mmol/L (ref 3.5–5.1)
Sodium: 139 mmol/L (ref 135–145)
Total Bilirubin: 0.7 mg/dL (ref 0.0–1.2)
Total Protein: 6.9 g/dL (ref 6.5–8.1)

## 2023-11-03 LAB — CBC WITH DIFFERENTIAL/PLATELET
Abs Immature Granulocytes: 0.05 K/uL (ref 0.00–0.07)
Basophils Absolute: 0.1 K/uL (ref 0.0–0.1)
Basophils Relative: 1 %
Eosinophils Absolute: 0.1 K/uL (ref 0.0–0.5)
Eosinophils Relative: 1 %
HCT: 43.3 % (ref 39.0–52.0)
Hemoglobin: 14.7 g/dL (ref 13.0–17.0)
Immature Granulocytes: 1 %
Lymphocytes Relative: 29 %
Lymphs Abs: 2.9 K/uL (ref 0.7–4.0)
MCH: 30.6 pg (ref 26.0–34.0)
MCHC: 33.9 g/dL (ref 30.0–36.0)
MCV: 90.2 fL (ref 80.0–100.0)
Monocytes Absolute: 0.8 K/uL (ref 0.1–1.0)
Monocytes Relative: 7 %
Neutro Abs: 6.3 K/uL (ref 1.7–7.7)
Neutrophils Relative %: 61 %
Platelets: 297 K/uL (ref 150–400)
RBC: 4.8 MIL/uL (ref 4.22–5.81)
RDW: 12.5 % (ref 11.5–15.5)
WBC: 10.1 K/uL (ref 4.0–10.5)
nRBC: 0 % (ref 0.0–0.2)

## 2023-11-03 LAB — I-STAT VENOUS BLOOD GAS, ED
Acid-base deficit: 2 mmol/L (ref 0.0–2.0)
Bicarbonate: 23.2 mmol/L (ref 20.0–28.0)
Calcium, Ion: 1.14 mmol/L — ABNORMAL LOW (ref 1.15–1.40)
HCT: 43 % (ref 39.0–52.0)
Hemoglobin: 14.6 g/dL (ref 13.0–17.0)
O2 Saturation: 63 %
Potassium: 5 mmol/L (ref 3.5–5.1)
Sodium: 140 mmol/L (ref 135–145)
TCO2: 24 mmol/L (ref 22–32)
pCO2, Ven: 41 mmHg — ABNORMAL LOW (ref 44–60)
pH, Ven: 7.362 (ref 7.25–7.43)
pO2, Ven: 34 mmHg (ref 32–45)

## 2023-11-03 LAB — MAGNESIUM: Magnesium: 1.7 mg/dL (ref 1.7–2.4)

## 2023-11-03 LAB — TROPONIN I (HIGH SENSITIVITY)
Troponin I (High Sensitivity): 5 ng/L (ref <18)
Troponin I (High Sensitivity): 8 ng/L

## 2023-11-03 LAB — I-STAT CHEM 8, ED
BUN: 19 mg/dL (ref 6–20)
Calcium, Ion: 1.13 mmol/L — ABNORMAL LOW (ref 1.15–1.40)
Chloride: 107 mmol/L (ref 98–111)
Creatinine, Ser: 2 mg/dL — ABNORMAL HIGH (ref 0.61–1.24)
Glucose, Bld: 287 mg/dL — ABNORMAL HIGH (ref 70–99)
HCT: 44 % (ref 39.0–52.0)
Hemoglobin: 15 g/dL (ref 13.0–17.0)
Potassium: 5 mmol/L (ref 3.5–5.1)
Sodium: 140 mmol/L (ref 135–145)
TCO2: 21 mmol/L — ABNORMAL LOW (ref 22–32)

## 2023-11-03 MED ORDER — APIXABAN 5 MG PO TABS
5.0000 mg | ORAL_TABLET | Freq: Two times a day (BID) | ORAL | Status: DC
  Administered 2023-11-04 – 2023-11-05 (×4): 5 mg via ORAL
  Filled 2023-11-03 (×4): qty 1

## 2023-11-03 MED ORDER — LACTATED RINGERS IV SOLN: INTRAVENOUS | Status: DC

## 2023-11-03 MED ORDER — LABETALOL HCL 5 MG/ML IV SOLN
20.0000 mg | Freq: Once | INTRAVENOUS | Status: AC
Start: 2023-11-03 — End: 2023-11-03
  Administered 2023-11-03: 20 mg via INTRAVENOUS
  Filled 2023-11-03: qty 4

## 2023-11-03 MED ORDER — METOCLOPRAMIDE HCL 5 MG/ML IJ SOLN
10.0000 mg | Freq: Once | INTRAMUSCULAR | Status: AC
  Administered 2023-11-03: 10 mg via INTRAVENOUS
  Filled 2023-11-03: qty 2

## 2023-11-03 MED ORDER — FENTANYL CITRATE PF 50 MCG/ML IJ SOSY
50.0000 ug | PREFILLED_SYRINGE | Freq: Once | INTRAMUSCULAR | Status: AC
  Administered 2023-11-03: 50 ug via INTRAVENOUS
  Filled 2023-11-03: qty 1

## 2023-11-03 MED ORDER — ONDANSETRON HCL 4 MG/2ML IJ SOLN
4.0000 mg | Freq: Once | INTRAMUSCULAR | Status: AC
Start: 2023-11-03 — End: 2023-11-03
  Administered 2023-11-03: 4 mg via INTRAVENOUS
  Filled 2023-11-03: qty 2

## 2023-11-03 MED ORDER — POLYETHYLENE GLYCOL 3350 17 G PO PACK: 17.0000 g | PACK | Freq: Every day | ORAL | Status: DC | PRN

## 2023-11-03 MED ORDER — MELATONIN 5 MG PO TABS: 5.0000 mg | ORAL_TABLET | Freq: Every evening | ORAL | Status: DC | PRN

## 2023-11-03 MED ORDER — DILTIAZEM HCL-DEXTROSE 125-5 MG/125ML-% IV SOLN (PREMIX)
5.0000 mg/h | INTRAVENOUS | Status: DC
  Administered 2023-11-03: 5 mg/h via INTRAVENOUS
  Filled 2023-11-03 (×2): qty 125

## 2023-11-03 MED ORDER — ACETAMINOPHEN 500 MG PO TABS
1000.0000 mg | ORAL_TABLET | Freq: Once | ORAL | Status: AC
  Administered 2023-11-03: 1000 mg via ORAL
  Filled 2023-11-03: qty 2

## 2023-11-03 MED ORDER — PROCHLORPERAZINE EDISYLATE 10 MG/2ML IJ SOLN: 5.0000 mg | Freq: Four times a day (QID) | INTRAMUSCULAR | Status: DC | PRN

## 2023-11-03 MED ORDER — IOHEXOL 350 MG/ML SOLN
100.0000 mL | Freq: Once | INTRAVENOUS | Status: AC | PRN
  Administered 2023-11-03: 100 mL via INTRAVENOUS

## 2023-11-03 MED ORDER — ACETAMINOPHEN 325 MG PO TABS: 650.0000 mg | ORAL_TABLET | Freq: Four times a day (QID) | ORAL | Status: DC | PRN

## 2023-11-03 NOTE — ED Notes (Signed)
 Pt with episode of large amount of emesis s/p reglan  administration. MD to bedside.

## 2023-11-03 NOTE — ED Notes (Signed)
 Pt brought to CT w/ RN/NT. Returned to room. Remains on continuous cardiac monitoring/Zoll

## 2023-11-03 NOTE — ED Notes (Signed)
 MD at bedside.

## 2023-11-03 NOTE — ED Triage Notes (Signed)
 Pt BIBA from football practice s/p syncopal episode. Per EMS pt reports dehydration today, hypotension, afib w/ runs of vtach enroute. Pt AAO on arrival. Pt endorses hx of afib, is not on blood thinners, Pt received 900 ml LR via EMS.

## 2023-11-03 NOTE — ED Notes (Addendum)
 Pt c/o left side facial numbness and HA. Brought to CT via RN. Returned to room. Pt c/o nausea/vomiting, MD aware.

## 2023-11-03 NOTE — ED Provider Notes (Signed)
 [place] EMERGENCY DEPARTMENT AT [facility] Provider Note   CSN: [id number] Arrival date & time: 11/03/23  8096     Patient presents with: Loss of Consciousness   Danny Fuller is a 48 y.o. male.   Patient is a 48 year old male with a history of asthma, diabetes on an insulin  pump, chronic kidney disease, hypertension, paroxysmal atrial fibrillation who is no longer on Eliquis  for months now who is presenting today after a syncopal episode [place] the [place] field.  Patient reports that at 1 AM this morning his insulin  pump ran out of insulin  and stopped working so he took it off and continue to use his Trulicity .  He was [place] the football field all day today and reports he was feeling fine until he was getting ready to leave and he got up and started feeling lightheaded and dizzy.  He remembers bending over and that is all he remembers.  They report that he syncopized on the field.  Initially they gave him juice thinking his blood sugar was low but when EMS arrived they found patient to be in atrial fibrillation with RVR rates up to 200 with a blood pressure in the 70s.  He also was having intermittent runs of V. tach.  His blood sugar was in the 200s, upon their arrival patient was alert and mentating normally but just complaining of upper back pain.  Upon arrival here patient's blood pressure is better after receiving 1 L of fluid from EMS.  Pressures in the low 100s.  He continues to complain of pain between his shoulder blades but denies any shortness of breath, nausea or vomiting.  He has no chest or abdominal pain.  He does also complain of palpitations which he reports noticing the last time he was in A-fib as well.  He is still compliant with his labetalol  and took it this morning.  He denies any unilateral leg pain or swelling.  No recent infectious symptoms.  The history is provided by the patient, the EMS personnel and medical records.  Loss of Consciousness      Prior to  Admission medications   Medication Sig Start Date End Date Taking? Authorizing Provider  amLODipine -olmesartan  (AZOR ) 10-40 MG tablet Take 1 tablet by mouth daily. Please call office to schedule an appt for further refills. Thank you 06/20/23   Lavona Agent, MD  apixaban  (ELIQUIS ) 5 MG TABS tablet Take 1 tablet (5 mg total) by mouth 2 (two) times daily. 08/27/23   Lavona Agent, MD  cloNIDine  (CATAPRES  - DOSED IN MG/24 HR) 0.3 mg/24hr patch APPLY 1 PATCH TOPICALLY TO SKIN  ONCE WEEKLY 04/02/23   Johnny Garnette LABOR, MD  Continuous Glucose Receiver (FREESTYLE LIBRE 3 READER) DEVI 1 each by Does not apply route daily. Use to check blood glucose continuously 06/01/23   Johnny Garnette LABOR, MD  Continuous Glucose Sensor (FREESTYLE LIBRE 3 PLUS SENSOR) MISC USE TO MONITOR BLOOD GLUCOSE  CONTINOUSLY. CHANGE SENSOR EVERY 15 DAYS 08/09/23   Johnny Garnette LABOR, MD  cyclobenzaprine  (FLEXERIL ) 10 MG tablet Take 1 tablet (10 mg total) by mouth 3 (three) times daily as needed for muscle spasms. 10/19/23   Johnny Garnette LABOR, MD  doxycycline  (VIBRA -TABS) 100 MG tablet Take 1 tablet (100 mg total) by mouth 2 (two) times daily. 10/22/23   Gershon Donnice SAUNDERS, DPM  Dulaglutide  (TRULICITY ) 3 MG/0.5ML SOAJ Inject 3 mg as directed once a week. 05/21/23   Johnny Garnette LABOR, MD  glucose blood test strip 1  each by Other route 4 (four) times daily.     [provider]  HUMALOG  100 UNIT/ML injection Inject into the skin as directed. 01/24/22   [provider]  hydrALAZINE  (APRESOLINE ) 50 MG tablet Take 1 tablet (50 mg total) by mouth 3 (three) times daily. 10/19/23   Johnny Garnette LABOR, MD  HYDROcodone -acetaminophen  (NORCO) 10-325 MG tablet TAKE 1 TABLET BY MOUTH EVERY 4 TO 6 HOURS FOR 5 DAYS AS NEEDED    [provider]  insulin  aspart (NOVOLOG ) 100 UNIT/ML injection     [provider]  Insulin  Disposable Pump (OMNIPOD DASH PODS, GEN 4,) MISC INJECT 1 EACH INTO THE SKIN CONTINUOUS. APPLY 1 OMNIPOD INSULIN  PUMP TO BODY  DAILY FOR INSULIN  06/25/20   Von [place], MD  Insulin  Pen Needle 31G X 5 MM MISC 1 each by Other route daily.  08/07/16   [provider]  labetalol  (NORMODYNE ) 100 MG tablet Take 1 tablet (100 mg total) by mouth daily. 10/19/23   Johnny Garnette LABOR, MD  nortriptyline  (PAMELOR ) 10 MG capsule TAKE 4 CAPSULES BY MOUTH AT BEDTIME 05/22/23   Patel, Donika K, DO  pravastatin  (PRAVACHOL ) 40 MG tablet TAKE 1 TABLET BY MOUTH EVERY  EVENING 09/05/22   Johnny Garnette LABOR, MD  pregabalin  (LYRICA ) 150 MG capsule Take 1 capsule (150 mg total) by mouth 3 (three) times daily. 05/22/23   Patel, Donika K, DO  silver  sulfADIAZINE  (SILVADENE ) 1 % cream Apply 1 Application topically daily. 10/22/23   Gershon Donnice SAUNDERS, DPM  spironolactone (ALDACTONE) 25 MG tablet Take 25 mg by mouth daily. 05/11/23   [provider]  traMADol  (ULTRAM ) 50 MG tablet Take 2 tablets (100 mg total) by mouth every 6 (six) hours as needed for moderate pain. 07/19/22   Johnny Garnette LABOR, MD  traMADol  (ULTRAM ) 50 MG tablet Take 2 tablets (100 mg total) by mouth every 6 (six) hours as needed. 05/21/23   Johnny Garnette LABOR, MD    Allergies: Augmentin  [amoxicillin -pot clavulanate] and Vancomycin     Review of Systems  Cardiovascular:  Positive for syncope.    Updated Vital Signs BP (!) 145/69   Pulse (!) 146   Temp (!) 96.9 F (36.1 C) (Axillary)   Resp 18   Ht 5' 9 (1.753 m)   Wt 106.6 kg   SpO2 94%   BMI 34.70 kg/m   Physical Exam Vitals and nursing note reviewed.  Constitutional:      Appearance: He is well-developed. He is ill-appearing.  HENT:     Head: Normocephalic and atraumatic.     Mouth/Throat:     Mouth: Mucous membranes are dry.  Eyes:     Conjunctiva/sclera: Conjunctivae normal.     Pupils: Pupils are equal, round, and reactive to light.  Cardiovascular:     Rate and Rhythm: Tachycardia present. Rhythm irregular. Frequent Extrasystoles are present.    Pulses: Normal pulses.     Heart sounds: No murmur heard.     Comments: Intermittent runs of V. tach no more than 4-5 beats at a time Pulmonary:     Effort: Pulmonary effort is normal. No respiratory distress.     Breath sounds: Normal breath sounds. No wheezing or rales.  Abdominal:     General: There is no distension.     Palpations: Abdomen is soft.     Tenderness: There is no abdominal tenderness. There is no guarding or rebound.  Musculoskeletal:        General: No tenderness. Normal range of  motion.     Cervical back: Normal range of motion and neck supple.     Right lower leg: No edema.     Left lower leg: No edema.  Skin:    General: Skin is warm and dry.     Findings: No erythema or rash.  Neurological:     Mental Status: He is alert and oriented to person, place, and time. Mental status is at baseline.  Psychiatric:        Behavior: Behavior normal.     (all labs ordered are listed, but only abnormal results are displayed) Labs Reviewed  COMPREHENSIVE METABOLIC PANEL WITH GFR - Abnormal; Notable for the following components:      Result Value   CO2 20 (*)    Glucose, Bld 283 (*)    Creatinine, Ser 2.04 (*)    GFR, Estimated 40 (*)    All other components within normal limits  I-STAT VENOUS BLOOD GAS, ED - Abnormal; Notable for the following components:   pCO2, Ven 41.0 (*)    Calcium , Ion 1.14 (*)    All other components within normal limits  I-STAT CHEM 8, ED - Abnormal; Notable for the following components:   Creatinine, Ser 2.00 (*)    Glucose, Bld 287 (*)    Calcium , Ion 1.13 (*)    TCO2 21 (*)    All other components within normal limits  CBC WITH DIFFERENTIAL/PLATELET  MAGNESIUM   TROPONIN I (HIGH SENSITIVITY)  TROPONIN I (HIGH SENSITIVITY)    EKG: EKG Interpretation Date/Time:  Saturday November 03 2023 19:12:09 EDT Ventricular Rate:  145 PR Interval:    QRS Duration:  85 QT Interval:  311 QTC Calculation: 483 R Axis:   110  Text Interpretation: recurrent Atrial fibrillation Ventricular premature  complex Right axis deviation ST depr, consider ischemia, inferior leads Borderline prolonged QT interval Confirmed by Doretha Folks ([id number]) on 11/03/2023 7:29:04 PM  Radiology: CT Head Wo Contrast Result Date: 11/03/2023 EXAM: CT HEAD WITHOUT CONTRAST 11/03/2023 09:28:53 PM TECHNIQUE: CT of the head was performed without the administration of intravenous contrast. Automated exposure control, iterative reconstruction, and/or weight based adjustment of the mA/kV was utilized to reduce the radiation dose to as low as reasonably achievable. COMPARISON: 04/05/2018 CLINICAL HISTORY: Head trauma, moderate-severe. FINDINGS: BRAIN AND VENTRICLES: No acute hemorrhage. No evidence of acute infarct. No hydrocephalus. No extra-axial collection. No mass effect or midline shift. Mild intracranial atherosclerosis. ORBITS: No acute abnormality. SINUSES: No acute abnormality. SOFT TISSUES AND SKULL: No acute soft tissue abnormality. No skull fracture. IMPRESSION: 1. No acute intracranial abnormality. Electronically signed by: Pinkie Pebbles MD 11/03/2023 09:34 PM EDT RP Workstation: [id number]   CT Angio Chest/Abd/Pel for Dissection W and/or Wo Contrast Result Date: 11/03/2023 CLINICAL DATA:  Syncopal episode, loss of consciousness, upper back pain and hypotension. Acute aortic syndrome suspected. EXAM: CT ANGIOGRAPHY CHEST, ABDOMEN AND PELVIS TECHNIQUE: Non-contrast CT of the chest was initially obtained. Multidetector CT imaging through the chest, abdomen and pelvis was performed using the standard protocol during bolus administration of intravenous contrast. Multiplanar reconstructed images and MIPs were obtained and reviewed to evaluate the vascular anatomy. RADIATION DOSE REDUCTION: This exam was performed according to the departmental dose-optimization program which includes automated exposure control, adjustment of the mA and/or kV according to patient size and/or use of iterative reconstruction technique.  CONTRAST:  OMNIPAQUE  IOHEXOL  350 MG/ML SOLN COMPARISON:  Portable chest from today, PA and lateral chest 10/21/2021, abdomen and pelvis CT no contrast 01/11/2017. No prior  chest CT or CTA. FINDINGS: CTA CHEST FINDINGS Cardiovascular: There is mild cardiomegaly with left ventricular and septal wall hypertrophy. Central pulmonary veins are mildly prominent. No pericardial effusion. There are no visible coronary artery calcifications. There is an enlarged pulmonary trunk measuring 3.4 cm but without findings of acute right heart strain. No arterial embolus is seen through the segmental divisions on this nontailored study. There is a small caliber but patent ductus arteriosus at the junction of the aortic isthmus and descending segment, filling with contrast on series 6 axial images 46-49 and coronal reconstruction series 9 images 70-79. Possible this could cause a degree of left-to-right shunt, but it is only 3 mm wide at most. There is a small amount of calcific plaque in the proximal descending aorta. Thoracic aorta is otherwise unremarkable without aneurysm, stenosis or dissection. The great vessels are clear with normal branching. There are small scattered air pockets in the left subclavian vein, trace air in both distal IJ veins, probably either injected with the contrast or at the time of IV insertion. Mediastinum/Nodes: No enlarged mediastinal, hilar, or axillary lymph nodes. The lower poles of the thyroid  gland, trachea, and esophagus demonstrate no significant findings. Lungs/Pleura: Low inspiration on exam. There is slight elevation of the right hemidiaphragm. There is posterior atelectasis in both lungs. Central airways are clear. There is no consolidation, nodule, effusion or pneumothorax. Musculoskeletal: Mild thoracic dextroscoliosis. No acute regional skeletal findings. There is mild anterior wedging of the T11 and T12 vertebrae with chronic appearance, mild thoracic spondylosis and degenerative  disc change. Bilateral discoid gynecomastia. Review of the MIP images confirms the above findings. CTA ABDOMEN AND PELVIS FINDINGS VASCULAR Aorta: Moderate calcific plaque in the infrarenal segment, more than usually seen at this age. No aneurysm, stenosis, penetrating ulcer or dissection. Celiac: Normal.  No branch occlusion. SMA: Normal.  No branch occlusion. Renals: Both are single. Both are normal. No hilar branch occlusion is seen. IMA: Widely patent. Inflow: There are patchy calcific plaques in the common iliac arteries, small amount in the proximal internal iliac arteries. There is no flow-limiting inflow vessel stenosis, occlusion, aneurysm or dissection. Both external iliac arteries are plaque free. Veins: No obvious venous abnormality within the limitations of this arterial phase study. Review of the MIP images confirms the above findings. NON-VASCULAR Hepatobiliary: 20.4 cm length, mildly steatotic. No mass enhancement. Unremarkable gallbladder and bile ducts. Pancreas: Mild fatty infiltration. No mass. No ductal dilatation or inflammation. Spleen: Normal. Adrenals/Urinary Tract: Stable slight nodular thickening both adrenal glands. Symmetric perinephric stranding appears similar. On the right there is subtle cortical hypoenhancement in the posterosuperior kidney on series 6 axial 155-162, unclear if this is bolus phase related, due to pyelonephritis or ischemic but no hilar branch occlusion is suspected. There is a Bosniak 1 cyst in the inferior pole of the left kidney measuring 5.1 cm, 18 Hounsfield units. Previously 3.1 cm. No follow-up imaging recommended. There is no mass enhancement in either kidney, no hydronephrosis or stone disease. There is mild bladder thickening versus underdistention. Stomach/Bowel: There is fluid filling of the stomach without overt wall thickening. Unremarkable unopacified small bowel. No inflammatory change. Normal appendix. Mild-to-moderate fecal stasis noted, scattered  descending and sigmoid diverticulosis without evidence of diverticulitis. Lymphatic: No lymphadenopathy is seen. Reproductive: Enlarged prostate, 5 cm transverse, previously 4.3 cm. Suggest clinical correlation. Multiple pelvic phleboliths. Other: Small umbilical and inguinal fat hernias. No incarcerated hernia. No free fluid, free hemorrhage or free air. Musculoskeletal: There is degenerative disc disease and spondylosis  of the lowest 2 lumbar levels with Schmorl's nodes, slight lumbar levoscoliosis. Moderate right and mild left hip DJD with bilateral hip pins chronically in place. Ankylosis over the superior left SI joint. No acute or other significant osseous findings. Review of the MIP images confirms the above findings. IMPRESSION: 1. No acute CTA findings. 2. Aortic atherosclerosis without aneurysm, stenosis or dissection. Calcific deposits in the infrarenal aorta are greater than expected for age. 3. Small caliber but patent ductus arteriosus at the junction of the aortic isthmus and descending segment. Possible this could cause a degree of left-to-right shunt, but it is only 3 mm wide at most. 4. Cardiomegaly with left ventricular and septal wall hypertrophy, and mildly prominent central pulmonary veins. No pulmonary edema. 5. Enlarged pulmonary trunk but no findings of acute right heart strain. 6. Subtle cortical hypoenhancement in the posterosuperior right kidney, unclear if this is bolus phase related, due to pyelonephritis or ischemic but no hilar branch occlusion is suspected. 7. Cystitis versus bladder nondistention. 8. Constipation and diverticulosis. 9. Umbilical and inguinal fat hernias. 10. Prostatomegaly increased from 2018. Suggest clinical correlation. Aortic Atherosclerosis (ICD10-I70.0). Electronically Signed   By: Francis Quam M.D.   On: 11/03/2023 20:47   DG Chest Port 1 View Result Date: 11/03/2023 EXAM: 1 VIEW XRAY OF THE CHEST 11/03/2023 07:42:57 PM COMPARISON: 10/21/2021 CLINICAL  HISTORY: Syncope. Loss of Consciousness. FINDINGS: LINES, TUBES AND DEVICES: Defibrillator pad overlying the left hemithorax. LUNGS AND PLEURA: No focal pulmonary opacity. No pulmonary edema. No pleural effusion. No pneumothorax. HEART AND MEDIASTINUM: No acute abnormality of the cardiac and mediastinal silhouettes. BONES AND SOFT TISSUES: No acute osseous abnormality. IMPRESSION: 1. No acute abnormalities. Electronically signed by: Pinkie Pebbles MD 11/03/2023 07:50 PM EDT RP Workstation: [id number]     Procedures   Medications Ordered in the ED  lactated ringers  infusion ( Intravenous New Bag/Given 11/03/23 2153)  diltiazem  (CARDIZEM ) 125 mg in dextrose  5% 125 mL (1 mg/mL) infusion (5 mg/hr Intravenous New Bag/Given 11/03/23 2135)  labetalol  (NORMODYNE ) injection 20 mg (20 mg Intravenous Given 11/03/23 1933)  fentaNYL  (SUBLIMAZE ) injection 50 mcg (50 mcg Intravenous Given 11/03/23 1930)  ondansetron  (ZOFRAN ) injection 4 mg (4 mg Intravenous Given 11/03/23 1930)  iohexol  (OMNIPAQUE ) 350 MG/ML injection 100 mL (100 mLs Intravenous Contrast Given 11/03/23 2007)  acetaminophen  (TYLENOL ) tablet 1,000 mg (1,000 mg Oral Given 11/03/23 2131)  metoCLOPramide  (REGLAN ) injection 10 mg (10 mg Intravenous Given 11/03/23 2131)                                    Medical Decision Making Amount and/or Complexity of Data Reviewed Independent Historian: EMS External Data Reviewed: notes. Labs: ordered. Decision-making details documented in ED Course. Radiology: ordered and independent interpretation performed. Decision-making details documented in ED Course. ECG/medicine tests: ordered and independent interpretation performed. Decision-making details documented in ED Course.  Risk OTC drugs. Prescription drug management. Decision regarding hospitalization.   Pt with multiple medical problems and comorbidities and presenting today with a complaint that caries a high risk for morbidity and mortality.  Here  today after a syncopal event.  Concern for dissection, PE, A-fib RVR with blood pressure dysregulation and hypotension causing syncope.  Also given patient's insulin  pump stopped working and he has not been wearing it all day and has been outside concern for electrolyte abnormalities, AKI, DKA. Upon arrival patient is mentating normally.  Is complaining of upper back pain and  also concern for atypical ACS.  EKG shows atrial fibrillation with RVR and significant ectopy.  He is on continuous cardiac monitoring which I independently interpreted and shows A-fib RVR but does have frequent bouts of V. tach with 5-6 beats which spontaneously terminates.  Patient's blood pressure is soft but mostly in the 100s.  He was given a second bolus of IV fluid.  He was given labetalol  and pain medication.  Did improve patient's heart rate to the low 100s with persistent A-fib.  Patient is not a candidate for cardioversion as he has been off Eliquis  for months.  Last episode of A-fib was approximately 2023.  I independently interpreted patient's labs and VBG without evidence of DKA with normal pH and bicarb, Chem-8 with new AKI with creatinine of 2.0 from his baseline of 1.16 and blood sugar of 286, CBC is within normal limits.  Troponin was normal, magnesium  within normal limits, TSH done within the last 6 months was normal.  I have independently visualized and interpreted pt's images today.  Chest x-ray within normal limits.  Patient was made a code medical and went over for CTA to evaluate for dissection.  No obvious dissection was seen.  Radiology report no evidence of dissection at this time.  Patient's wife is now present and reports that she thinks somebody lowered him to the ground and she does not think he fell and hit his head but he is now complaining of a left-sided headache and is now started having some nausea and vomiting.  Pupils are reactive bilaterally.  Patient is moving upper and lower extremities without  difficulty.  No evidence of facial droop.  Will do a CT to ensure no evidence of injury.  CT was negative of the head for acute bleed.  Patient continues in atrial fibrillation but blood pressure is now improved.  He was given antiemetics, Cardizem  drip was started.  Will admit patient for further care to stepdown to the hospitalist service.  Discussed all this with the patient and his family.  They are comfortable with this plan.  He reports the pain in his back is also improved.  CRITICAL CARE Performed by: Shareeka Yim Total critical care time: 55 minutes Critical care time was exclusive of separately billable procedures and treating other patients. Critical care was necessary to treat or prevent imminent or life-threatening deterioration. Critical care was time spent personally by me on the following activities: development of treatment plan with patient and/or surrogate as well as nursing, discussions with consultants, evaluation of patient's response to treatment, examination of patient, obtaining history from patient or surrogate, ordering and performing treatments and interventions, ordering and review of laboratory studies, ordering and review of radiographic studies, pulse oximetry and re-evaluation of patient's condition.      Final diagnoses:  Syncope and collapse  Atrial fibrillation with RVR (HCC)  AKI (acute kidney injury) (HCC)  Hypotension, unspecified hypotension type    ED Discharge Orders     None          Doretha Folks, MD 11/03/23 2159

## 2023-11-03 NOTE — H&P (Incomplete)
 History and Physical  Danny Fuller FMW:[id number] DOB: 01-01-1976 DOA: 11/03/2023  Referring physician: Dr. Doretha, EDP  PCP: Johnny Garnette LABOR, MD  Outpatient Specialists: Cardiology. Patient coming from: Home.  Chief Complaint: Passed out.  HPI: Danny Fuller is a 48 y.o. 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  Endorses his insulin  pump stopped working around 1 AM today.  He uses Trulicity  due to elevated blood sugars.  He was at the football field all day and when he was getting ready to leave he suddenly felt dizzy and passed out.  After he regained consciousness he had some palpitations.  EMS was activated.  The patient was found to be in A-fib with RVR with rates in the 200s and low blood  pressures with systolic in the 70s.  Endorses he stopped taking his home Eliquis  for months although he is receiving refills from his provider.  In the ER, tachycardic with heart rate in the 160s.  Diltiazem  drip was initiated with improvement of his heart rate.  Denies any overt bleeding, Eliquis  was restarted.  Creatinine was elevated above baseline, received IV fluid in the ER.  Admitted for high risk syncope.  ED Course: Temperature 98.4.  BP 99/61, pulse 77, respiration rate 17, O2 saturation 99% on room air.  Review of Systems: Review of systems as noted in the HPI. All other systems reviewed and are negative.   Past Medical History:  Diagnosis Date   Asthma    Back pain    Bulging Disk   Chronic kidney disease    Diabetes mellitus    sees Warren Batty NP at [facility]   Diabetic foot ulcers [facility])    sees Dr. Norleen Armor    GERD (gastroesophageal reflux disease)    Glaucoma    Headache(784.0)    Hyperlipidemia    Hypertension    sees Dr. Lavona     Sleep apnea    CPAP   Past Surgical History:  Procedure Laterality Date   AMPUTATION Left 07/26/2015   Procedure: AMPUTATION LEFT FIFTH RAY;  Surgeon: Jerona LULLA Sage, MD;  Location: [facility] OR;  Service: Orthopedics;  Laterality: Left;   bilateral hip pins placed     INCISION AND DRAINAGE PERIRECTAL ABSCESS Right 03/07/2016   Procedure: IRRIGATION AND DEBRIDEMENT PERIRECTAL ABSCESS;  Surgeon: Alm Angle, MD;  Location: [facility] ORS;  Service: General;  Laterality: Right;   INCISION AND DRAINAGE PERIRECTAL ABSCESS Right 03/09/2016   Procedure: EXAM UNDER ANESTHESIA, IRRIGATION AND DEBRIDEMENT PERIRECTAL ABSCESS;  Surgeon: Donnice Lunger, MD;  Location: [facility] ORS;  Service: General;  Laterality: Right;  Open, betadine packed wound   TOE AMPUTATION Left 11/19/2019   Removal of all toes from left foot   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/20/2019   Procedure: TRANSMETATARSAL AMPUTATION LEFT FOOT;  Surgeon: Armor Norleen, MD;  Location: [facility] OR;  Service: Orthopedics;  Laterality: Left;    Social History:  reports that he quit smoking about 8 years ago. His smoking use included cigarettes. He started smoking about 28 years ago. He has a 10 pack-year smoking history. He has never used smokeless tobacco. He reports current alcohol use of about 1.0 standard drink of alcohol per week. He reports that he does not use drugs.   Allergies  Allergen Reactions   Augmentin  [Amoxicillin -Pot Clavulanate] Itching and Other (  See Comments)    Dizziness   Vancomycin  Other (See Comments)    AKI    Family History  Problem Relation Age of Onset   Arthritis Other    Diabetes Other    Hypertension Other    Hyperlipidemia Other    Stroke Other    Sudden death Other    Diabetes Mother    Diabetes Sister    Diabetes Brother    Heart attack Father        Died ate 89, couple of heart attacks       Prior to Admission medications   Medication Sig Start Date End Date Taking? Authorizing Provider  sertraline  (ZOLOFT ) 50 MG  tablet Take 50 mg by mouth daily. 09/18/23  Yes [provider]  amLODipine -olmesartan  (AZOR ) 10-40 MG tablet Take 1 tablet by mouth daily. Please call office to schedule an appt for further refills. Thank you 06/20/23   Lavona Agent, MD  apixaban  (ELIQUIS ) 5 MG TABS tablet Take 1 tablet (5 mg total) by mouth 2 (two) times daily. 08/27/23   Lavona Agent, MD  cloNIDine  (CATAPRES  - DOSED IN MG/24 HR) 0.3 mg/24hr patch APPLY 1 PATCH TOPICALLY TO SKIN  ONCE WEEKLY 04/02/23   Johnny Garnette LABOR, MD  cyclobenzaprine  (FLEXERIL ) 10 MG tablet Take 1 tablet (10 mg total) by mouth 3 (three) times daily as needed for muscle spasms. 10/19/23   Johnny Garnette LABOR, MD  doxycycline  (VIBRA -TABS) 100 MG tablet Take 1 tablet (100 mg total) by mouth 2 (two) times daily. 10/22/23   Gershon Donnice SAUNDERS, DPM  Dulaglutide  (TRULICITY ) 3 MG/0.5ML SOAJ Inject 3 mg as directed once a week. 05/21/23   Johnny Garnette LABOR, MD  HUMALOG  100 UNIT/ML injection Inject into the skin as directed. 01/24/22   [provider]  hydrALAZINE  (APRESOLINE ) 50 MG tablet Take 1 tablet (50 mg total) by mouth 3 (three) times daily. 10/19/23   Johnny Garnette LABOR, MD  HYDROcodone -acetaminophen  (NORCO) 10-325 MG tablet TAKE 1 TABLET BY MOUTH EVERY 4 TO 6 HOURS FOR 5 DAYS AS NEEDED    [provider]  insulin  aspart (NOVOLOG ) 100 UNIT/ML injection     [provider]  Insulin  Disposable Pump (OMNIPOD DASH PODS, GEN 4,) MISC INJECT 1 EACH INTO THE SKIN CONTINUOUS. APPLY 1 OMNIPOD INSULIN  PUMP TO BODY DAILY FOR INSULIN  06/25/20   Von [place], MD  labetalol  (NORMODYNE ) 100 MG tablet Take 1 tablet (100 mg total) by mouth daily. 10/19/23   Johnny Garnette LABOR, MD  nortriptyline  (PAMELOR ) 10 MG capsule TAKE 4 CAPSULES BY MOUTH AT BEDTIME 05/22/23   Patel, Donika K, DO  pravastatin  (PRAVACHOL ) 40 MG tablet TAKE 1 TABLET BY MOUTH EVERY  EVENING 09/05/22   Johnny Garnette LABOR, MD  pregabalin  (LYRICA ) 150 MG capsule Take 1 capsule (150 mg total) by mouth 3 (three)  times daily. 05/22/23   Patel, Donika K, DO  silver  sulfADIAZINE  (SILVADENE ) 1 % cream Apply 1 Application topically daily. 10/22/23   Gershon Donnice SAUNDERS, DPM  spironolactone (ALDACTONE) 25 MG tablet Take 25 mg by mouth daily. 05/11/23   [provider]  traMADol  (ULTRAM ) 50 MG tablet Take 2 tablets (100 mg total) by mouth every 6 (six) hours as needed. 05/21/23   Johnny Garnette LABOR, MD    Physical Exam: BP 130/74 (BP Location: Left Arm)   Pulse (!) 131   Temp (!) 96.9 F (36.1 C) (Axillary)   Resp (!) 21   Ht 5' 9 (1.753 m)   Wt 106.6  kg   SpO2 97%   BMI 34.70 kg/m   General: 49 y.o. year-old male well developed well nourished in no acute distress.  Alert and oriented x3. Cardiovascular: Irregular rate and rhythm with no rubs or gallops.  No thyromegaly or JVD noted.  No lower extremity edema. 2/4 pulses in all 4 extremities. Respiratory: Clear to auscultation with no wheezes or rales. Good inspiratory effort. Abdomen: Soft nontender nondistended with normal bowel sounds x4 quadrants. Muskuloskeletal: No cyanosis, clubbing or edema noted bilaterally Neuro: CN II-XII intact, strength, sensation, reflexes Skin: No ulcerative lesions noted or rashes Psychiatry: Judgement and insight appear normal. Mood is appropriate for condition and setting          Labs on Admission:  Basic Metabolic Panel: Recent Labs  Lab 11/03/23 1937 11/03/23 1942  NA 139 140  140  K 5.1 5.0  5.0  CL 105 107  CO2 20*  --   GLUCOSE 283* 287*  BUN 17 19  CREATININE 2.04* 2.00*  CALCIUM  9.1  --   MG 1.7  --    Liver Function Tests: Recent Labs  Lab 11/03/23 1937  AST 19  ALT 17  ALKPHOS 72  BILITOT 0.7  PROT 6.9  ALBUMIN  3.6   No results for input(s): LIPASE, AMYLASE in the last 168 hours. No results for input(s): AMMONIA in the last 168 hours. CBC: Recent Labs  Lab 11/03/23 1937 11/03/23 1942  WBC 10.1  --   NEUTROABS 6.3  --   HGB 14.7 15.0  14.6  HCT 43.3 44.0  43.0   MCV 90.2  --   PLT 297  --    Cardiac Enzymes: No results for input(s): CKTOTAL, CKMB, CKMBINDEX, TROPONINI in the last 168 hours.  BNP (last 3 results) No results for input(s): BNP in the last 8760 hours.  ProBNP (last 3 results) No results for input(s): PROBNP in the last 8760 hours.  CBG: No results for input(s): GLUCAP in the last 168 hours.  Radiological Exams on Admission: CT Head Wo Contrast Result Date: 11/03/2023 EXAM: CT HEAD WITHOUT CONTRAST 11/03/2023 09:28:53 PM TECHNIQUE: CT of the head was performed without the administration of intravenous contrast. Automated exposure control, iterative reconstruction, and/or weight based adjustment of the mA/kV was utilized to reduce the radiation dose to as low as reasonably achievable. COMPARISON: 04/05/2018 CLINICAL HISTORY: Head trauma, moderate-severe. FINDINGS: BRAIN AND VENTRICLES: No acute hemorrhage. No evidence of acute infarct. No hydrocephalus. No extra-axial collection. No mass effect or midline shift. Mild intracranial atherosclerosis. ORBITS: No acute abnormality. SINUSES: No acute abnormality. SOFT TISSUES AND SKULL: No acute soft tissue abnormality. No skull fracture. IMPRESSION: 1. No acute intracranial abnormality. Electronically signed by: Pinkie Pebbles MD 11/03/2023 09:34 PM EDT RP Workstation: [id number]   CT Angio Chest/Abd/Pel for Dissection W and/or Wo Contrast Result Date: 11/03/2023 CLINICAL DATA:  Syncopal episode, loss of consciousness, upper back pain and hypotension. Acute aortic syndrome suspected. EXAM: CT ANGIOGRAPHY CHEST, ABDOMEN AND PELVIS TECHNIQUE: Non-contrast CT of the chest was initially obtained. Multidetector CT imaging through the chest, abdomen and pelvis was performed using the standard protocol during bolus administration of intravenous contrast. Multiplanar reconstructed images and MIPs were obtained and reviewed to evaluate the vascular anatomy. RADIATION DOSE REDUCTION: This  exam was performed according to the departmental dose-optimization program which includes automated exposure control, adjustment of the mA and/or kV according to patient size and/or use of iterative reconstruction technique. CONTRAST:  OMNIPAQUE  IOHEXOL  350 MG/ML SOLN COMPARISON:  Portable chest  from today, PA and lateral chest 10/21/2021, abdomen and pelvis CT no contrast 01/11/2017. No prior chest CT or CTA. FINDINGS: CTA CHEST FINDINGS Cardiovascular: There is mild cardiomegaly with left ventricular and septal wall hypertrophy. Central pulmonary veins are mildly prominent. No pericardial effusion. There are no visible coronary artery calcifications. There is an enlarged pulmonary trunk measuring 3.4 cm but without findings of acute right heart strain. No arterial embolus is seen through the segmental divisions on this nontailored study. There is a small caliber but patent ductus arteriosus at the junction of the aortic isthmus and descending segment, filling with contrast on series 6 axial images 46-49 and coronal reconstruction series 9 images 70-79. Possible this could cause a degree of left-to-right shunt, but it is only 3 mm wide at most. There is a small amount of calcific plaque in the proximal descending aorta. Thoracic aorta is otherwise unremarkable without aneurysm, stenosis or dissection. The great vessels are clear with normal branching. There are small scattered air pockets in the left subclavian vein, trace air in both distal IJ veins, probably either injected with the contrast or at the time of IV insertion. Mediastinum/Nodes: No enlarged mediastinal, hilar, or axillary lymph nodes. The lower poles of the thyroid  gland, trachea, and esophagus demonstrate no significant findings. Lungs/Pleura: Low inspiration on exam. There is slight elevation of the right hemidiaphragm. There is posterior atelectasis in both lungs. Central airways are clear. There is no consolidation, nodule, effusion or  pneumothorax. Musculoskeletal: Mild thoracic dextroscoliosis. No acute regional skeletal findings. There is mild anterior wedging of the T11 and T12 vertebrae with chronic appearance, mild thoracic spondylosis and degenerative disc change. Bilateral discoid gynecomastia. Review of the MIP images confirms the above findings. CTA ABDOMEN AND PELVIS FINDINGS VASCULAR Aorta: Moderate calcific plaque in the infrarenal segment, more than usually seen at this age. No aneurysm, stenosis, penetrating ulcer or dissection. Celiac: Normal.  No branch occlusion. SMA: Normal.  No branch occlusion. Renals: Both are single. Both are normal. No hilar branch occlusion is seen. IMA: Widely patent. Inflow: There are patchy calcific plaques in the common iliac arteries, small amount in the proximal internal iliac arteries. There is no flow-limiting inflow vessel stenosis, occlusion, aneurysm or dissection. Both external iliac arteries are plaque free. Veins: No obvious venous abnormality within the limitations of this arterial phase study. Review of the MIP images confirms the above findings. NON-VASCULAR Hepatobiliary: 20.4 cm length, mildly steatotic. No mass enhancement. Unremarkable gallbladder and bile ducts. Pancreas: Mild fatty infiltration. No mass. No ductal dilatation or inflammation. Spleen: Normal. Adrenals/Urinary Tract: Stable slight nodular thickening both adrenal glands. Symmetric perinephric stranding appears similar. On the right there is subtle cortical hypoenhancement in the posterosuperior kidney on series 6 axial 155-162, unclear if this is bolus phase related, due to pyelonephritis or ischemic but no hilar branch occlusion is suspected. There is a Bosniak 1 cyst in the inferior pole of the left kidney measuring 5.1 cm, 18 Hounsfield units. Previously 3.1 cm. No follow-up imaging recommended. There is no mass enhancement in either kidney, no hydronephrosis or stone disease. There is mild bladder thickening versus  underdistention. Stomach/Bowel: There is fluid filling of the stomach without overt wall thickening. Unremarkable unopacified small bowel. No inflammatory change. Normal appendix. Mild-to-moderate fecal stasis noted, scattered descending and sigmoid diverticulosis without evidence of diverticulitis. Lymphatic: No lymphadenopathy is seen. Reproductive: Enlarged prostate, 5 cm transverse, previously 4.3 cm. Suggest clinical correlation. Multiple pelvic phleboliths. Other: Small umbilical and inguinal fat hernias. No incarcerated hernia.  No free fluid, free hemorrhage or free air. Musculoskeletal: There is degenerative disc disease and spondylosis of the lowest 2 lumbar levels with Schmorl's nodes, slight lumbar levoscoliosis. Moderate right and mild left hip DJD with bilateral hip pins chronically in place. Ankylosis over the superior left SI joint. No acute or other significant osseous findings. Review of the MIP images confirms the above findings. IMPRESSION: 1. No acute CTA findings. 2. Aortic atherosclerosis without aneurysm, stenosis or dissection. Calcific deposits in the infrarenal aorta are greater than expected for age. 3. Small caliber but patent ductus arteriosus at the junction of the aortic isthmus and descending segment. Possible this could cause a degree of left-to-right shunt, but it is only 3 mm wide at most. 4. Cardiomegaly with left ventricular and septal wall hypertrophy, and mildly prominent central pulmonary veins. No pulmonary edema. 5. Enlarged pulmonary trunk but no findings of acute right heart strain. 6. Subtle cortical hypoenhancement in the posterosuperior right kidney, unclear if this is bolus phase related, due to pyelonephritis or ischemic but no hilar branch occlusion is suspected. 7. Cystitis versus bladder nondistention. 8. Constipation and diverticulosis. 9. Umbilical and inguinal fat hernias. 10. Prostatomegaly increased from 2018. Suggest clinical correlation. Aortic  Atherosclerosis (ICD10-I70.0). Electronically Signed   By: Francis Quam M.D.   On: 11/03/2023 20:47   DG Chest Port 1 View Result Date: 11/03/2023 EXAM: 1 VIEW XRAY OF THE CHEST 11/03/2023 07:42:57 PM COMPARISON: 10/21/2021 CLINICAL HISTORY: Syncope. Loss of Consciousness. FINDINGS: LINES, TUBES AND DEVICES: Defibrillator pad overlying the left hemithorax. LUNGS AND PLEURA: No focal pulmonary opacity. No pulmonary edema. No pleural effusion. No pneumothorax. HEART AND MEDIASTINUM: No acute abnormality of the cardiac and mediastinal silhouettes. BONES AND SOFT TISSUES: No acute osseous abnormality. IMPRESSION: 1. No acute abnormalities. Electronically signed by: Pinkie Pebbles MD 11/03/2023 07:50 PM EDT RP Workstation: [id number]    EKG: I independently viewed the EKG done and my findings are as followed: Atrial fibrillation rate of 145.  Nonspecific ST changes.  QTc 483.  Occasional PVCs.  Assessment/Plan Present on Admission:  Syncope and collapse  Principal Problem:   Syncope and collapse  Syncope and collapse Received IV fluid in the ER Orthostatic vital signs every shift x 2 Follow-up 2D echo  Paroxysmal atrial fibrillation with RVR Home Eliquis  restarted, took himself off the medication for several months. Currently on diltiazem  drip Continue to monitor on telemetry  OSA, noncompliant with CPAP Counseled on the importance of compliance with CPAP to avoid cardiac complications CPAP nightly.  Type 2 diabetes with hyperglycemia Insulin  pump ran out of insulin  Continue subcu insulin  coverage Heart healthy carb modified diet. Last hemoglobin A1c 9.8 on 10/19/2023  Hyperlipidemia Resume home regimen.  Obesity BMI 34 Recommend weight loss outpatient regular physical activity and healthy dieting.   Critical care time: 65 minutes.   DVT prophylaxis: Eliquis   Code Status: Full code.  Family Communication: Wife at bedside.  Disposition Plan: Admitted to progressive  care unit.  Consults called: None.  Admission status: Inpatient status.   Status is: Inpatient The patient requires at least 2 midnights for further evaluation and treatment of present condition.   Terry LOISE Hurst MD [facility] Pager [phone]  If 7PM-7AM, please contact night-coverage [email or web address] Password [email or web address]  11/03/2023, 10:16 PM

## 2023-11-04 ENCOUNTER — Inpatient Hospital Stay (HOSPITAL_COMMUNITY)

## 2023-11-04 DIAGNOSIS — I4891 Unspecified atrial fibrillation: Secondary | ICD-10-CM

## 2023-11-04 DIAGNOSIS — R55 Syncope and collapse: Secondary | ICD-10-CM | POA: Diagnosis not present

## 2023-11-04 LAB — ECHOCARDIOGRAM COMPLETE
Area-P 1/2: 4.77 cm2
Height: 69 in
S' Lateral: 2.29 cm
Single Plane A4C EF: 65.5 %
Weight: 3760 [oz_av]

## 2023-11-04 LAB — BASIC METABOLIC PANEL WITH GFR
Anion gap: 10 (ref 5–15)
BUN: 19 mg/dL (ref 6–20)
CO2: 21 mmol/L — ABNORMAL LOW (ref 22–32)
Calcium: 8.4 mg/dL — ABNORMAL LOW (ref 8.9–10.3)
Chloride: 104 mmol/L (ref 98–111)
Creatinine, Ser: 1.95 mg/dL — ABNORMAL HIGH (ref 0.61–1.24)
GFR, Estimated: 42 mL/min — ABNORMAL LOW (ref >60)
Glucose, Bld: 322 mg/dL — ABNORMAL HIGH (ref 70–99)
Potassium: 4.2 mmol/L (ref 3.5–5.1)
Sodium: 135 mmol/L (ref 135–145)

## 2023-11-04 LAB — GLUCOSE, CAPILLARY
Glucose-Capillary: 286 mg/dL — ABNORMAL HIGH (ref 70–99)
Glucose-Capillary: 311 mg/dL — ABNORMAL HIGH (ref 70–99)
Glucose-Capillary: 197 mg/dL — ABNORMAL HIGH (ref 70–99)
Glucose-Capillary: 239 mg/dL — ABNORMAL HIGH (ref 70–99)
Glucose-Capillary: 233 mg/dL — ABNORMAL HIGH (ref 70–99)

## 2023-11-04 LAB — PHOSPHORUS: Phosphorus: 4.2 mg/dL (ref 2.5–4.6)

## 2023-11-04 LAB — MAGNESIUM: Magnesium: 1.9 mg/dL (ref 1.7–2.4)

## 2023-11-04 LAB — HIV ANTIBODY (ROUTINE TESTING W REFLEX): HIV Screen 4th Generation wRfx: NONREACTIVE

## 2023-11-04 MED ORDER — NORTRIPTYLINE HCL 10 MG PO CAPS
40.0000 mg | ORAL_CAPSULE | Freq: Every day | ORAL | Status: DC
  Administered 2023-11-04: 40 mg via ORAL
  Filled 2023-11-04 (×2): qty 4

## 2023-11-04 MED ORDER — LACTATED RINGERS IV SOLN: INTRAVENOUS | Status: DC

## 2023-11-04 MED ORDER — PRAVASTATIN SODIUM 40 MG PO TABS
40.0000 mg | ORAL_TABLET | Freq: Every evening | ORAL | Status: DC
  Administered 2023-11-04: 40 mg via ORAL
  Filled 2023-11-04: qty 1

## 2023-11-04 MED ORDER — ONDANSETRON HCL 4 MG/2ML IJ SOLN: 4.0000 mg | Freq: Four times a day (QID) | INTRAMUSCULAR | Status: DC | PRN

## 2023-11-04 MED ORDER — INSULIN ASPART 100 UNIT/ML IJ SOLN
0.0000 [IU] | Freq: Three times a day (TID) | INTRAMUSCULAR | Status: DC
  Administered 2023-11-04: 4 [IU] via SUBCUTANEOUS
  Administered 2023-11-04: 15 [IU] via SUBCUTANEOUS
  Administered 2023-11-04 – 2023-11-05 (×2): 7 [IU] via SUBCUTANEOUS

## 2023-11-04 MED ORDER — INSULIN ASPART 100 UNIT/ML IJ SOLN
0.0000 [IU] | Freq: Every day | INTRAMUSCULAR | Status: DC
  Administered 2023-11-04: 2 [IU] via SUBCUTANEOUS
  Administered 2023-11-04: 3 [IU] via SUBCUTANEOUS

## 2023-11-04 MED ORDER — CYCLOBENZAPRINE HCL 10 MG PO TABS: 10.0000 mg | ORAL_TABLET | Freq: Three times a day (TID) | ORAL | Status: DC | PRN

## 2023-11-04 MED ORDER — SENNOSIDES-DOCUSATE SODIUM 8.6-50 MG PO TABS
1.0000 | ORAL_TABLET | Freq: Two times a day (BID) | ORAL | Status: DC
  Administered 2023-11-04 – 2023-11-05 (×2): 1 via ORAL
  Filled 2023-11-04 (×2): qty 1

## 2023-11-04 MED ORDER — INSULIN GLARGINE 100 UNIT/ML ~~LOC~~ SOLN
20.0000 [IU] | Freq: Every day | SUBCUTANEOUS | Status: DC
  Administered 2023-11-04 – 2023-11-05 (×2): 20 [IU] via SUBCUTANEOUS
  Filled 2023-11-04 (×2): qty 0.2

## 2023-11-04 MED ORDER — PROCHLORPERAZINE EDISYLATE 10 MG/2ML IJ SOLN: 10.0000 mg | Freq: Four times a day (QID) | INTRAMUSCULAR | Status: DC | PRN

## 2023-11-04 MED ORDER — DILTIAZEM HCL ER COATED BEADS 180 MG PO CP24
180.0000 mg | ORAL_CAPSULE | Freq: Every day | ORAL | Status: DC
  Administered 2023-11-04 – 2023-11-05 (×2): 180 mg via ORAL
  Filled 2023-11-04 (×2): qty 1

## 2023-11-04 NOTE — Progress Notes (Signed)
 [facility] Progress Note Patient: Danny Fuller FMW:[id number] DOB: 1975/10/24  DOA: 11/03/2023 DOS: the patient was seen and examined on 11/04/2023  Brief Hospital Course: Patient with PMH of PAF, OSA, HTN, HLD, type II DM on insulin  pump present to the hospital with complaints of passing out event. Patient was coaching at a football field.  Felt a little dizzy and tired and a fluttering in his heart.  And then passed out. At the time of my evaluation he feels fatigue and tired without any other acute complaint.  Assessment and Plan: Paroxysmal atrial fibrillation with RVR. Syncope and collapse. Presented with a passing out event secondary to paroxysmal A-fib. On labetalol  at home.  Started on Cardizem  drip.  Rate is currently controlled and actually currently in sinus rhythm. Echocardiogram shows preserved EF no valvular abnormality. Switch to oral Cardizem  and monitor.  Patient was recommended to be on Eliquis .  Patient tells me that he has not been taking Eliquis  since he never felt he is in A-fib in last 6 months. Eliquis  is currently resumed.  Monitor.  AKI. Possible renal infarct. In April patient had a normal renal function. Current serum creatinine is 1.95. On admission serum creatinine was more than 2. CT abdomen and pelvis with contrast shows evidence of possible right cortical enhancement concerning for an ischemic injury. Could be related to patient's noncompliant with his A-fib anticoagulation. Check urinary workup. Continue with IV fluid.  Diabetes mellitus, uncontrolled with hyperglycemia with long-term insulin  use now with CKD. Suspect the patient has CKD. Uses insulin  pump at home. Will continue with sliding scale insulin  and Lantus  for now.  HTN. Patient is on amlodipine  and Azor . Was recently started on hydralazine . Also on labetalol . For now we will be switching to Cardizem . May require visit to review to be better we will be holding off on  Norvasc  and labetalol  going forward.  HLD. Continue statin.  Mood disorder. Resuming home regimen.  Neuropathy. Patient is on Lyrica  although currently does not take this medication.  Obesity Class 2 Body mass index is 34.7 kg/m.  Placing the pt at higher risk of poor outcomes. On Trulicity .   Subjective: No nausea no vomiting no fever no chills.  Feeling tired and fatigue.  No bleeding.  Physical Exam: Clear to auscultation S1-S2 present Bowel sound. No abdominal tenderness. No edema.  Data Reviewed: I have Reviewed nursing notes, Vitals, and Lab results. Since last encounter, pertinent lab results CBC and BMP   . I have ordered test including CBC and BMP  .   Disposition: Status is: Inpatient Remains inpatient appropriate because: Monitor for improved renal function apixaban  (ELIQUIS ) tablet 5 mg    Family Communication: Family at bedside Level of care: Progressive   Vitals:   11/04/23 0700 11/04/23 0802 11/04/23 1131 11/04/23 1554  BP: 132/74 130/80 (!) 147/90 (!) 151/83  Pulse: 75 72 76 81  Resp: 14 18 17 16   Temp: 98.1 F (36.7 C)  97.6 F (36.4 C) 98.2 F (36.8 C)  TempSrc: Oral  Oral Oral  SpO2: 99% 100% 100% 100%  Weight:      Height:         Author: Yetta Blanch, MD 11/04/2023 6:54 PM  Please look on [email or web address] to find out who is on call.

## 2023-11-04 NOTE — Plan of Care (Signed)

## 2023-11-04 NOTE — Hospital Course (Signed)
 Patient with PMH of PAF, OSA, HTN, HLD, type II DM on insulin  pump present to the hospital with complaints of passing out event. Patient was coaching at a football field.  Felt a little dizzy and tired and a fluttering in his heart.  And then passed out. At the time of my evaluation he feels fatigue and tired without any other acute complaint.  Assessment and Plan: Paroxysmal atrial fibrillation with RVR. Syncope and collapse. Presented with a passing out event secondary to paroxysmal A-fib. On labetalol  at home.  Started on Cardizem  drip.  Rate is currently controlled and actually currently in sinus rhythm. Echocardiogram shows preserved EF no valvular abnormality. Switch to oral Cardizem  and monitor.  Patient was recommended to be on Eliquis .  Patient tells me that he has not been taking Eliquis  since he never felt he is in A-fib in last 6 months. Eliquis  is currently resumed.  Monitor.  AKI. Possible renal infarct. In April patient had a normal renal function. Current serum creatinine is 1.95. On admission serum creatinine was more than 2. CT abdomen and pelvis with contrast shows evidence of possible right cortical enhancement concerning for an ischemic injury. Could be related to patient's noncompliant with his A-fib anticoagulation. Check urinary workup. Continue with IV fluid.  Diabetes mellitus, uncontrolled with hyperglycemia with long-term insulin  use now with CKD. Suspect the patient has CKD. Uses insulin  pump at home. Will continue with sliding scale insulin  and Lantus  for now.  HTN. Patient is on amlodipine  and Azor . Was recently started on hydralazine . Also on labetalol . For now we will be switching to Cardizem . May require visit to review to be better we will be holding off on Norvasc  and labetalol  going forward.  HLD. Continue statin.  Mood disorder. Resuming home regimen.  Neuropathy. Patient is on Lyrica  although currently does not take this  medication.  Obesity Class 2 Body mass index is 34.7 kg/m.  Placing the pt at higher risk of poor outcomes. On Trulicity .

## 2023-11-04 NOTE — Plan of Care (Signed)

## 2023-11-04 NOTE — Progress Notes (Signed)
Pt. Refused cpap. 

## 2023-11-04 NOTE — ED Notes (Signed)
 Report given to [facility]. Pt to transport to IP unit w/ RN

## 2023-11-04 NOTE — Progress Notes (Signed)
   11/04/23 2145  BiPAP/CPAP/SIPAP  BiPAP/CPAP/SIPAP Pt Type Adult  Reason BIPAP/CPAP not in use Non-compliant

## 2023-11-05 ENCOUNTER — Other Ambulatory Visit (HOSPITAL_COMMUNITY): Payer: Self-pay

## 2023-11-05 DIAGNOSIS — R55 Syncope and collapse: Secondary | ICD-10-CM | POA: Diagnosis not present

## 2023-11-05 LAB — URINALYSIS, COMPLETE (UACMP) WITH MICROSCOPIC
Bacteria, UA: NONE SEEN
Bilirubin Urine: NEGATIVE
Glucose, UA: 50 mg/dL — AB
Hgb urine dipstick: NEGATIVE
Ketones, ur: NEGATIVE mg/dL
Leukocytes,Ua: NEGATIVE
Nitrite: NEGATIVE
Protein, ur: NEGATIVE mg/dL
Specific Gravity, Urine: 1.008 (ref 1.005–1.030)
pH: 7 (ref 5.0–8.0)

## 2023-11-05 LAB — RENAL FUNCTION PANEL
Albumin: 3 g/dL — ABNORMAL LOW (ref 3.5–5.0)
Anion gap: 10 (ref 5–15)
BUN: 11 mg/dL (ref 6–20)
CO2: 23 mmol/L (ref 22–32)
Calcium: 8.8 mg/dL — ABNORMAL LOW (ref 8.9–10.3)
Chloride: 103 mmol/L (ref 98–111)
Creatinine, Ser: 1.05 mg/dL (ref 0.61–1.24)
GFR, Estimated: >60 mL/min (ref >60)
Glucose, Bld: 183 mg/dL — ABNORMAL HIGH (ref 70–99)
Phosphorus: 3.1 mg/dL (ref 2.5–4.6)
Potassium: 4.1 mmol/L (ref 3.5–5.1)
Sodium: 136 mmol/L (ref 135–145)

## 2023-11-05 LAB — CREATININE, URINE, RANDOM: Creatinine, Urine: 42 mg/dL

## 2023-11-05 LAB — SODIUM, URINE, RANDOM: Sodium, Ur: 122 mmol/L

## 2023-11-05 LAB — GLUCOSE, CAPILLARY: Glucose-Capillary: 213 mg/dL — ABNORMAL HIGH (ref 70–99)

## 2023-11-05 LAB — OSMOLALITY, URINE: Osmolality, Ur: 358 mosm/kg (ref 300–900)

## 2023-11-05 MED ORDER — APIXABAN 5 MG PO TABS
5.0000 mg | ORAL_TABLET | Freq: Two times a day (BID) | ORAL | 0 refills | Status: AC
  Filled 2023-11-05: qty 60, 30d supply, fill #0

## 2023-11-05 MED ORDER — OLMESARTAN MEDOXOMIL 40 MG PO TABS
40.0000 mg | ORAL_TABLET | Freq: Every day | ORAL | 0 refills | Status: DC
  Filled 2023-11-05: qty 30, 30d supply, fill #0

## 2023-11-05 MED ORDER — DILTIAZEM HCL ER COATED BEADS 180 MG PO CP24
180.0000 mg | ORAL_CAPSULE | Freq: Every day | ORAL | 0 refills | Status: DC
  Filled 2023-11-05: qty 30, 30d supply, fill #0

## 2023-11-05 NOTE — Plan of Care (Signed)
 Problem: Education: Goal: Ability to describe self-care measures that may prevent or decrease complications (Diabetes Survival Skills Education) will improve 11/05/2023 1153 by Gary Michaelis, RN Outcome: Adequate for Discharge 11/05/2023 1038 by Gary Michaelis, RN Outcome: Progressing Goal: Individualized Educational Video(s) 11/05/2023 1153 by Gary Michaelis, RN Outcome: Adequate for Discharge 11/05/2023 1038 by Gary Michaelis, RN Outcome: Progressing   Problem: Coping: Goal: Ability to adjust to condition or change in health will improve 11/05/2023 1153 by Gary Michaelis, RN Outcome: Adequate for Discharge 11/05/2023 1038 by Gary Michaelis, RN Outcome: Progressing   Problem: Fluid Volume: Goal: Ability to maintain a balanced intake and output will improve 11/05/2023 1153 by Gary Michaelis, RN Outcome: Adequate for Discharge 11/05/2023 1038 by Gary Michaelis, RN Outcome: Progressing   Problem: Health Behavior/Discharge Planning: Goal: Ability to identify and utilize available resources and services will improve 11/05/2023 1153 by Gary Michaelis, RN Outcome: Adequate for Discharge 11/05/2023 1038 by Gary Michaelis, RN Outcome: Progressing Goal: Ability to manage health-related needs will improve 11/05/2023 1153 by Gary Michaelis, RN Outcome: Adequate for Discharge 11/05/2023 1038 by Gary Michaelis, RN Outcome: Progressing   Problem: Metabolic: Goal: Ability to maintain appropriate glucose levels will improve 11/05/2023 1153 by Gary Michaelis, RN Outcome: Adequate for Discharge 11/05/2023 1038 by Gary Michaelis, RN Outcome: Progressing   Problem: Nutritional: Goal: Maintenance of adequate nutrition will improve 11/05/2023 1153 by Gary Michaelis, RN Outcome: Adequate for Discharge 11/05/2023 1038 by Gary Michaelis, RN Outcome: Progressing Goal: Progress toward achieving an optimal weight will improve 11/05/2023 1153 by Gary Michaelis, RN Outcome: Adequate for Discharge 11/05/2023 1038 by Gary Michaelis, RN Outcome: Progressing   Problem: Skin Integrity: Goal: Risk for impaired skin integrity will decrease 11/05/2023 1153 by Gary Michaelis, RN Outcome: Adequate for Discharge 11/05/2023 1038 by Gary Michaelis, RN Outcome: Progressing   Problem: Tissue Perfusion: Goal: Adequacy of tissue perfusion will improve 11/05/2023 1153 by Gary Michaelis, RN Outcome: Adequate for Discharge 11/05/2023 1038 by Gary Michaelis, RN Outcome: Progressing   Problem: Education: Goal: Knowledge of General Education information will improve Description: Including pain rating scale, medication(s)/side effects and non-pharmacologic comfort measures 11/05/2023 1153 by Gary Michaelis, RN Outcome: Adequate for Discharge 11/05/2023 1038 by Gary Michaelis, RN Outcome: Progressing   Problem: Health Behavior/Discharge Planning: Goal: Ability to manage health-related needs will improve 11/05/2023 1153 by Gary Michaelis, RN Outcome: Adequate for Discharge 11/05/2023 1038 by Gary Michaelis, RN Outcome: Progressing   Problem: Clinical Measurements: Goal: Ability to maintain clinical measurements within normal limits will improve 11/05/2023 1153 by Gary Michaelis, RN Outcome: Adequate for Discharge 11/05/2023 1038 by Gary Michaelis, RN Outcome: Progressing Goal: Will remain free from infection 11/05/2023 1153 by Gary Michaelis, RN Outcome: Adequate for Discharge 11/05/2023 1038 by Gary Michaelis, RN Outcome: Progressing Goal: Diagnostic test results will improve 11/05/2023 1153 by Gary Michaelis, RN Outcome: Adequate for Discharge 11/05/2023 1038 by Gary Michaelis, RN Outcome: Progressing Goal: Respiratory complications will improve 11/05/2023 1153 by Gary Michaelis, RN Outcome: Adequate for Discharge 11/05/2023 1038 by Gary Michaelis, RN Outcome: Progressing Goal: Cardiovascular  complication will be avoided 11/05/2023 1153 by Gary Michaelis, RN Outcome: Adequate for Discharge 11/05/2023 1038 by Gary Michaelis, RN Outcome: Progressing   Problem: Activity: Goal: Risk for activity intolerance will decrease 11/05/2023 1153 by Gary Michaelis, RN Outcome: Adequate for Discharge 11/05/2023 1038 by Gary Michaelis, RN Outcome: Progressing   Problem: Nutrition: Goal: Adequate nutrition will be maintained 11/05/2023 1153 by Gary Michaelis, RN Outcome: Adequate for Discharge 11/05/2023 1038 by Gary Michaelis, RN Outcome: Progressing  Problem: Coping: Goal: Level of anxiety will decrease 11/05/2023 1153 by Gary Michaelis, RN Outcome: Adequate for Discharge 11/05/2023 1038 by Gary Michaelis, RN Outcome: Progressing   Problem: Elimination: Goal: Will not experience complications related to bowel motility 11/05/2023 1153 by Gary Michaelis, RN Outcome: Adequate for Discharge 11/05/2023 1038 by Gary Michaelis, RN Outcome: Progressing Goal: Will not experience complications related to urinary retention 11/05/2023 1153 by Gary Michaelis, RN Outcome: Adequate for Discharge 11/05/2023 1038 by Gary Michaelis, RN Outcome: Progressing   Problem: Pain Managment: Goal: General experience of comfort will improve and/or be controlled 11/05/2023 1153 by Gary Michaelis, RN Outcome: Adequate for Discharge 11/05/2023 1038 by Gary Michaelis, RN Outcome: Progressing   Problem: Safety: Goal: Ability to remain free from injury will improve 11/05/2023 1153 by Gary Michaelis, RN Outcome: Adequate for Discharge 11/05/2023 1038 by Gary Michaelis, RN Outcome: Progressing   Problem: Skin Integrity: Goal: Risk for impaired skin integrity will decrease 11/05/2023 1153 by Gary Michaelis, RN Outcome: Adequate for Discharge 11/05/2023 1038 by Gary Michaelis, RN Outcome: Progressing

## 2023-11-05 NOTE — Plan of Care (Signed)

## 2023-11-05 NOTE — Progress Notes (Signed)
   11/05/23 1013  TOC Brief Assessment  Insurance and Status Reviewed  Patient has primary care physician Yes  Home environment has been reviewed home w/ spouse  Prior level of function: independent  Prior/Current Home Services No current home services  Social Drivers of Health Review SDOH reviewed no interventions necessary  Readmission risk has been reviewed Yes  Transition of care needs no transition of care needs at this time    Pt stable for transition home today, no HH or DME needs noted, no inpt CM interventions needed on discharge.

## 2023-11-06 ENCOUNTER — Telehealth: Payer: Self-pay | Admitting: *Deleted

## 2023-11-06 LAB — UREA NITROGEN, URINE: Urea Nitrogen, Ur: 233 mg/dL

## 2023-11-06 NOTE — Transitions of Care (Post Inpatient/ED Visit) (Signed)
   11/06/2023  Name: Danny Fuller MRN: [id number] DOB: April 02, 1975  Today's TOC FU Call Status: Today's TOC FU Call Status:: Unsuccessful Call (1st Attempt) Unsuccessful Call (1st Attempt) Date: 11/06/23  Attempted to reach the patient regarding the most recent Inpatient/ED visit.  Follow Up Plan: Additional outreach attempts will be made to reach the patient to complete the Transitions of Care (Post Inpatient/ED visit) call.   Cathlean Headland BSN RN [facility] Health Care Management Coordinator [email or web address] Direct Dial: [phone]  Fax: [phone] Website: [email or web address]

## 2023-11-07 ENCOUNTER — Telehealth: Payer: Self-pay

## 2023-11-07 NOTE — Discharge Summary (Signed)
 Physician Discharge Summary   Patient: Danny Fuller MRN: [id number] DOB: [birth year]  Admit date:     11/03/2023  Discharge date: 11/05/2023  Discharge Physician: Yetta Blanch  PCP: Johnny Garnette LABOR, MD  Recommendations at discharge: Follow-up with PCP in 1 week. Follow-up with cardiology in the clinic as recommended. Repeat CBC and BMP in 1 week.   Follow-up Information     Johnny Garnette LABOR, MD. Schedule an appointment as soon as possible for a visit in 1 week(s).   Specialty: Family Medicine Why: To discuss blood pressure meds, With blood sugar log, with BMP lab to look at kidney/electrolyte numbers, with CBC lab to look at blood counts Contact information: [address] [place] [address] [phone]         [facility] A Dept of [employer] Follow up in 2 week(s).   Specialty: Cardiology Why: afib clinic Contact information: [address] [place]  [address] [phone]               Hospital Course: Patient with PMH of PAF, OSA, HTN, HLD, type II DM on insulin  pump present to the hospital with complaints of passing out event. Patient was coaching at a football field.  Felt a little dizzy and tired and a fluttering in his heart.  And then passed out. At the time of my evaluation he feels fatigue and tired without any other acute complaint.  Assessment and Plan: Paroxysmal atrial fibrillation with RVR. Syncope and collapse. Presented with a passing out event secondary to paroxysmal A-fib. On labetalol  at home.  Started on Cardizem  drip.  Rate is currently controlled and actually currently in sinus rhythm. Echocardiogram shows preserved EF no valvular abnormality. Switch to oral Cardizem .  Heart rate remained stable.  Patient was recommended to be on Eliquis .  Patient tells me that he has not been taking Eliquis  since he never felt he is in A-fib in last 6 months. Eliquis  is currently resumed.  Monitor  outpatient  AKI. Possible renal infarct. In April patient had a normal renal function. Current serum creatinine is 1.95. On admission serum creatinine was more than 2. CT abdomen and pelvis with contrast shows evidence of possible right cortical enhancement concerning for an ischemic injury. Could be related to patient's noncompliant with his A-fib anticoagulation. Renal function improved significantly after IV fluid. Recommend close observation outpatient.  Diabetes mellitus, uncontrolled with hyperglycemia with long-term insulin  use, no complication. Uses insulin  pump at home.  HTN. Patient is on amlodipine  and Azor . Was recently started on hydralazine . Also on labetalol . For now we will be switching to Cardizem . Discontinue Norvasc  and discontinue on labetalol .  Continue olmesartan .  HLD. Continue statin.  Mood disorder. Resuming home regimen.  Neuropathy. Patient is on Lyrica  although currently does not take this medication.  Obesity Class 2 Body mass index is 34.7 kg/m.  Placing the pt at higher risk of poor outcomes. On Trulicity .  Consultants:  None  Procedures performed:  Echocardiogram  DISCHARGE MEDICATION: Allergies as of 11/05/2023       Reactions   Augmentin  [amoxicillin -pot Clavulanate] Itching, Other (See Comments)   Dizziness   Vancomycin  Other (See Comments)   AKI        Medication List     PAUSE taking these medications    hydrALAZINE  50 MG tablet Wait to take this until your doctor or other care provider tells you to start again. Commonly known as: APRESOLINE  Take 1 tablet (  50 mg total) by mouth 3 (three) times daily. What changed: when to take this   labetalol  100 MG tablet Wait to take this until your doctor or other care provider tells you to start again. Commonly known as: NORMODYNE  Take 1 tablet (100 mg total) by mouth daily.   pregabalin  150 MG capsule Wait to take this until your doctor or other care provider tells you  to start again. Commonly known as: LYRICA  Take 1 capsule (150 mg total) by mouth 3 (three) times daily. What changed: when to take this       STOP taking these medications    amLODipine -olmesartan  10-40 MG tablet Commonly known as: AZOR    doxycycline  100 MG tablet Commonly known as: VIBRA -TABS   insulin  lispro 100 UNIT/ML cartridge Commonly known as: HUMALOG        TAKE these medications    cyclobenzaprine  10 MG tablet Commonly known as: FLEXERIL  Take 1 tablet (10 mg total) by mouth 3 (three) times daily as needed for muscle spasms.   diltiazem  180 MG 24 hr capsule Commonly known as: CARDIZEM  CD Take 1 capsule (180 mg total) by mouth daily.   Eliquis  5 MG Tabs tablet Generic drug: apixaban  Take 1 tablet (5 mg total) by mouth 2 (two) times daily.   insulin  aspart 100 UNIT/ML injection Commonly known as: novoLOG    nortriptyline  10 MG capsule Commonly known as: PAMELOR  TAKE 4 CAPSULES BY MOUTH AT BEDTIME   olmesartan  40 MG tablet Commonly known as: BENICAR  Take 1 tablet (40 mg total) by mouth daily.   Omnipod DASH Pods (Gen 4) Misc INJECT 1 EACH INTO THE SKIN CONTINUOUS. APPLY 1 OMNIPOD INSULIN  PUMP TO BODY DAILY FOR INSULIN    pravastatin  40 MG tablet Commonly known as: PRAVACHOL  TAKE 1 TABLET BY MOUTH EVERY  EVENING   silver  sulfADIAZINE  1 % cream Commonly known as: Silvadene  Apply 1 Application topically daily.   traMADol  50 MG tablet Commonly known as: ULTRAM  Take 2 tablets (100 mg total) by mouth every 6 (six) hours as needed.   Trulicity  3 MG/0.5ML Soaj Generic drug: Dulaglutide  Inject 3 mg as directed once a week. What changed:  how to take this additional instructions       Disposition: Home Diet recommendation: Carb modified diet  Discharge Exam: Vitals:   11/04/23 2030 11/04/23 2100 11/05/23 0400 11/05/23 0800  BP:  (!) 165/93 (!) 151/94 (!) 169/99  Pulse: 82 87 80 86  Resp: 15 17 13 14   Temp:  98 F (36.7 C) 98.2 F (36.8 C)  98.4 F (36.9 C)  TempSrc:  Oral Oral Oral  SpO2: 100% 99% 99% 100%  Weight:      Height:       Clear to auscultation. S1-S2 present bronchial bowel sound present Trace edema. Filed Weights   11/03/23 1914  Weight: 106.6 kg   Condition at discharge: stable  The results of significant diagnostics from this hospitalization (including imaging, microbiology, ancillary and laboratory) are listed below for reference.   Imaging Studies: ECHOCARDIOGRAM COMPLETE Result Date: 11/04/2023    ECHOCARDIOGRAM REPORT   Patient Name:   MAKIH STEFANKO Date of Exam: 11/04/2023 Medical Rec #:  [id number]        Height:       69.0 in Accession #:    [id number]       Weight:       235.0 lb Date of Birth:  07-29-1975        BSA:  2.213 m Patient Age:    48 years         BP:           132/74 mmHg Patient Gender: M                HR:           85 bpm. Exam Location:  Inpatient Procedure: 2D Echo and Strain Analysis (Both Spectral and Color Flow Doppler            were utilized during procedure). Indications:    Atrial Fibrillation  History:        Patient has prior history of Echocardiogram examinations. Risk                 Factors:Hypertension.  Sonographer:    Charmaine Gaskins Referring Phys: [id number] CAROLE N HALL IMPRESSIONS  1. Left ventricular ejection fraction, by estimation, is 60 to 65%. The left ventricle has normal function. The left ventricle has no regional wall motion abnormalities. There is moderate concentric left ventricular hypertrophy. Left ventricular diastolic parameters were normal. The average left ventricular global longitudinal strain is -15.6 %. The global longitudinal strain is abnormal.  2. Right ventricular systolic function is normal. The right ventricular size is normal. There is mildly elevated pulmonary artery systolic pressure. The estimated right ventricular systolic pressure is 35.0 mmHg.  3. Left atrial size was mildly dilated.  4. The mitral valve is normal in structure. Mild  mitral valve regurgitation.  5. Tricuspid valve regurgitation is mild to moderate.  6. The aortic valve is tricuspid. Aortic valve regurgitation is not visualized. No aortic stenosis is present.  7. The inferior vena cava is dilated in size with >50% respiratory variability, suggesting right atrial pressure of 8 mmHg. Comparison(s): Prior images reviewed side by side. Mean left atrial pressure is higher. Right atrial pressure is higher. FINDINGS  Left Ventricle: Left ventricular ejection fraction, by estimation, is 60 to 65%. The left ventricle has normal function. The left ventricle has no regional wall motion abnormalities. The average left ventricular global longitudinal strain is -15.6 %. Strain was performed and the global longitudinal strain is abnormal. The left ventricular internal cavity size was normal in size. There is moderate concentric left ventricular hypertrophy. Left ventricular diastolic parameters were normal. Right Ventricle: The right ventricular size is normal. No increase in right ventricular wall thickness. Right ventricular systolic function is normal. There is mildly elevated pulmonary artery systolic pressure. The tricuspid regurgitant velocity is 2.60  m/s, and with an assumed right atrial pressure of 8 mmHg, the estimated right ventricular systolic pressure is 35.0 mmHg. Left Atrium: Left atrial size was mildly dilated. Right Atrium: Right atrial size was normal in size. Pericardium: There is no evidence of pericardial effusion. Mitral Valve: The mitral valve is normal in structure. Mild mitral valve regurgitation, with centrally-directed jet. Tricuspid Valve: The tricuspid valve is normal in structure. Tricuspid valve regurgitation is mild to moderate. Aortic Valve: The aortic valve is tricuspid. Aortic valve regurgitation is not visualized. No aortic stenosis is present. Pulmonic Valve: The pulmonic valve was normal in structure. Pulmonic valve regurgitation is trivial. No evidence of  pulmonic stenosis. Aorta: The aortic root and ascending aorta are structurally normal, with no evidence of dilitation. Venous: The inferior vena cava is dilated in size with greater than 50% respiratory variability, suggesting right atrial pressure of 8 mmHg. IAS/Shunts: There is redundancy of the interatrial septum. No atrial level shunt detected by color flow Doppler.  LEFT  VENTRICLE PLAX 2D LVIDd:         4.33 cm     Diastology LVIDs:         2.29 cm     LV e' medial:    10.20 cm/s LV PW:         1.55 cm     LV E/e' medial:  8.6 LV IVS:        1.61 cm     LV e' lateral:   8.05 cm/s LVOT diam:     2.42 cm     LV E/e' lateral: 10.9 LVOT Area:     4.60 cm                            2D Longitudinal Strain                            2D Strain GLS Avg:     -15.6 % LV Volumes (MOD) LV vol d, MOD A4C: 88.2 ml LV vol s, MOD A4C: 30.4 ml LV SV MOD A4C:     88.2 ml RIGHT VENTRICLE RV Basal diam:  2.53 cm RV Mid diam:    2.69 cm RV S prime:     12.30 cm/s LEFT ATRIUM             Index        RIGHT ATRIUM           Index LA diam:        3.91 cm 1.77 cm/m   RA Area:     12.90 cm LA Vol (A2C):   57.1 ml 25.81 ml/m  RA Volume:   26.60 ml  12.02 ml/m LA Vol (A4C):   74.2 ml 33.54 ml/m LA Biplane Vol: 64.8 ml 29.29 ml/m   AORTA Ao Asc diam: 3.70 cm MITRAL VALVE               TRICUSPID VALVE MV Area (PHT): 4.77 cm    TR Peak grad:   27.0 mmHg MV Decel Time: 159 msec    TR Vmax:        260.00 cm/s MV E velocity: 87.50 cm/s MV A velocity: 91.20 cm/s  SHUNTS MV E/A ratio:  0.96        Systemic Diam: 2.42 cm Mihai Croitoru MD Electronically signed by Jerel Balding MD Signature Date/Time: 11/04/2023/2:42:35 PM    Final    CT Head Wo Contrast Result Date: 11/03/2023 EXAM: CT HEAD WITHOUT CONTRAST 11/03/2023 09:28:53 PM TECHNIQUE: CT of the head was performed without the administration of intravenous contrast. Automated exposure control, iterative reconstruction, and/or weight based adjustment of the mA/kV was utilized to  reduce the radiation dose to as low as reasonably achievable. COMPARISON: 04/05/2018 CLINICAL HISTORY: Head trauma, moderate-severe. FINDINGS: BRAIN AND VENTRICLES: No acute hemorrhage. No evidence of acute infarct. No hydrocephalus. No extra-axial collection. No mass effect or midline shift. Mild intracranial atherosclerosis. ORBITS: No acute abnormality. SINUSES: No acute abnormality. SOFT TISSUES AND SKULL: No acute soft tissue abnormality. No skull fracture. IMPRESSION: 1. No acute intracranial abnormality. Electronically signed by: Pinkie Pebbles MD 11/03/2023 09:34 PM EDT RP Workstation: [id number]   CT Angio Chest/Abd/Pel for Dissection W and/or Wo Contrast Result Date: 11/03/2023 CLINICAL DATA:  Syncopal episode, loss of consciousness, upper back pain and hypotension. Acute aortic syndrome suspected. EXAM: CT ANGIOGRAPHY CHEST, ABDOMEN AND PELVIS TECHNIQUE: Non-contrast CT of the chest was  initially obtained. Multidetector CT imaging through the chest, abdomen and pelvis was performed using the standard protocol during bolus administration of intravenous contrast. Multiplanar reconstructed images and MIPs were obtained and reviewed to evaluate the vascular anatomy. RADIATION DOSE REDUCTION: This exam was performed according to the departmental dose-optimization program which includes automated exposure control, adjustment of the mA and/or kV according to patient size and/or use of iterative reconstruction technique. CONTRAST:  OMNIPAQUE  IOHEXOL  350 MG/ML SOLN COMPARISON:  Portable chest from today, PA and lateral chest 10/21/2021, abdomen and pelvis CT no contrast 01/11/2017. No prior chest CT or CTA. FINDINGS: CTA CHEST FINDINGS Cardiovascular: There is mild cardiomegaly with left ventricular and septal wall hypertrophy. Central pulmonary veins are mildly prominent. No pericardial effusion. There are no visible coronary artery calcifications. There is an enlarged pulmonary trunk measuring 3.4 cm  but without findings of acute right heart strain. No arterial embolus is seen through the segmental divisions on this nontailored study. There is a small caliber but patent ductus arteriosus at the junction of the aortic isthmus and descending segment, filling with contrast on series 6 axial images 46-49 and coronal reconstruction series 9 images 70-79. Possible this could cause a degree of left-to-right shunt, but it is only 3 mm wide at most. There is a small amount of calcific plaque in the proximal descending aorta. Thoracic aorta is otherwise unremarkable without aneurysm, stenosis or dissection. The great vessels are clear with normal branching. There are small scattered air pockets in the left subclavian vein, trace air in both distal IJ veins, probably either injected with the contrast or at the time of IV insertion. Mediastinum/Nodes: No enlarged mediastinal, hilar, or axillary lymph nodes. The lower poles of the thyroid  gland, trachea, and esophagus demonstrate no significant findings. Lungs/Pleura: Low inspiration on exam. There is slight elevation of the right hemidiaphragm. There is posterior atelectasis in both lungs. Central airways are clear. There is no consolidation, nodule, effusion or pneumothorax. Musculoskeletal: Mild thoracic dextroscoliosis. No acute regional skeletal findings. There is mild anterior wedging of the T11 and T12 vertebrae with chronic appearance, mild thoracic spondylosis and degenerative disc change. Bilateral discoid gynecomastia. Review of the MIP images confirms the above findings. CTA ABDOMEN AND PELVIS FINDINGS VASCULAR Aorta: Moderate calcific plaque in the infrarenal segment, more than usually seen at this age. No aneurysm, stenosis, penetrating ulcer or dissection. Celiac: Normal.  No branch occlusion. SMA: Normal.  No branch occlusion. Renals: Both are single. Both are normal. No hilar branch occlusion is seen. IMA: Widely patent. Inflow: There are patchy calcific  plaques in the common iliac arteries, small amount in the proximal internal iliac arteries. There is no flow-limiting inflow vessel stenosis, occlusion, aneurysm or dissection. Both external iliac arteries are plaque free. Veins: No obvious venous abnormality within the limitations of this arterial phase study. Review of the MIP images confirms the above findings. NON-VASCULAR Hepatobiliary: 20.4 cm length, mildly steatotic. No mass enhancement. Unremarkable gallbladder and bile ducts. Pancreas: Mild fatty infiltration. No mass. No ductal dilatation or inflammation. Spleen: Normal. Adrenals/Urinary Tract: Stable slight nodular thickening both adrenal glands. Symmetric perinephric stranding appears similar. On the right there is subtle cortical hypoenhancement in the posterosuperior kidney on series 6 axial 155-162, unclear if this is bolus phase related, due to pyelonephritis or ischemic but no hilar branch occlusion is suspected. There is a Bosniak 1 cyst in the inferior pole of the left kidney measuring 5.1 cm, 18 Hounsfield units. Previously 3.1 cm. No follow-up imaging recommended. There  is no mass enhancement in either kidney, no hydronephrosis or stone disease. There is mild bladder thickening versus underdistention. Stomach/Bowel: There is fluid filling of the stomach without overt wall thickening. Unremarkable unopacified small bowel. No inflammatory change. Normal appendix. Mild-to-moderate fecal stasis noted, scattered descending and sigmoid diverticulosis without evidence of diverticulitis. Lymphatic: No lymphadenopathy is seen. Reproductive: Enlarged prostate, 5 cm transverse, previously 4.3 cm. Suggest clinical correlation. Multiple pelvic phleboliths. Other: Small umbilical and inguinal fat hernias. No incarcerated hernia. No free fluid, free hemorrhage or free air. Musculoskeletal: There is degenerative disc disease and spondylosis of the lowest 2 lumbar levels with Schmorl's nodes, slight lumbar  levoscoliosis. Moderate right and mild left hip DJD with bilateral hip pins chronically in place. Ankylosis over the superior left SI joint. No acute or other significant osseous findings. Review of the MIP images confirms the above findings. IMPRESSION: 1. No acute CTA findings. 2. Aortic atherosclerosis without aneurysm, stenosis or dissection. Calcific deposits in the infrarenal aorta are greater than expected for age. 3. Small caliber but patent ductus arteriosus at the junction of the aortic isthmus and descending segment. Possible this could cause a degree of left-to-right shunt, but it is only 3 mm wide at most. 4. Cardiomegaly with left ventricular and septal wall hypertrophy, and mildly prominent central pulmonary veins. No pulmonary edema. 5. Enlarged pulmonary trunk but no findings of acute right heart strain. 6. Subtle cortical hypoenhancement in the posterosuperior right kidney, unclear if this is bolus phase related, due to pyelonephritis or ischemic but no hilar branch occlusion is suspected. 7. Cystitis versus bladder nondistention. 8. Constipation and diverticulosis. 9. Umbilical and inguinal fat hernias. 10. Prostatomegaly increased from 2018. Suggest clinical correlation. Aortic Atherosclerosis (ICD10-I70.0). Electronically Signed   By: Francis Quam M.D.   On: 11/03/2023 20:47   DG Chest Port 1 View Result Date: 11/03/2023 EXAM: 1 VIEW XRAY OF THE CHEST 11/03/2023 07:42:57 PM COMPARISON: 10/21/2021 CLINICAL HISTORY: Syncope. Loss of Consciousness. FINDINGS: LINES, TUBES AND DEVICES: Defibrillator pad overlying the left hemithorax. LUNGS AND PLEURA: No focal pulmonary opacity. No pulmonary edema. No pleural effusion. No pneumothorax. HEART AND MEDIASTINUM: No acute abnormality of the cardiac and mediastinal silhouettes. BONES AND SOFT TISSUES: No acute osseous abnormality. IMPRESSION: 1. No acute abnormalities. Electronically signed by: Pinkie Pebbles MD 11/03/2023 07:50 PM EDT RP  Workstation: [id number]    Microbiology: Results for orders placed or performed during the hospital encounter of 11/20/19  Respiratory Panel by RT PCR (Flu A&B, Covid) - Nasopharyngeal Swab     Status: None   Collection Time: 11/20/19 10:51 AM   Specimen: Nasopharyngeal Swab  Result Value Ref Range Status   SARS Coronavirus 2 by RT PCR NEGATIVE NEGATIVE Final    Comment: (NOTE) SARS-CoV-2 target nucleic acids are NOT DETECTED.  The SARS-CoV-2 RNA is generally detectable in upper respiratoy specimens during the acute phase of infection. The lowest concentration of SARS-CoV-2 viral copies this assay can detect is 131 copies/mL. A negative result does not preclude SARS-Cov-2 infection and should not be used as the sole basis for treatment or other patient management decisions. A negative result may occur with  improper specimen collection/handling, submission of specimen other than nasopharyngeal swab, presence of viral mutation(s) within the areas targeted by this assay, and inadequate number of viral copies (<131 copies/mL). A negative result must be combined with clinical observations, patient history, and epidemiological information. The expected result is Negative.  Fact Sheet for Patients:  https://www.moore.com/  Fact Sheet for Healthcare Providers:  https://www.young.biz/  This test is no t yet approved or cleared by the [place]  FDA and  has been authorized for detection and/or diagnosis of SARS-CoV-2 by FDA under an Emergency Use Authorization (EUA). This EUA will remain  in effect (meaning this test can be used) for the duration of the COVID-19 declaration under Section 564(b)(1) of the Act, 21 U.S.C. section 360bbb-3(b)(1), unless the authorization is terminated or revoked sooner.     Influenza A by PCR NEGATIVE NEGATIVE Final   Influenza B by PCR NEGATIVE NEGATIVE Final    Comment: (NOTE) The Xpert Xpress  SARS-CoV-2/FLU/RSV assay is intended as an aid in  the diagnosis of influenza from Nasopharyngeal swab specimens and  should not be used as a sole basis for treatment. Nasal washings and  aspirates are unacceptable for Xpert Xpress SARS-CoV-2/FLU/RSV  testing.  Fact Sheet for Patients: https://www.moore.com/  Fact Sheet for Healthcare Providers: https://www.young.biz/  This test is not yet approved or cleared by the [place]  FDA and  has been authorized for detection and/or diagnosis of SARS-CoV-2 by  FDA under an Emergency Use Authorization (EUA). This EUA will remain  in effect (meaning this test can be used) for the duration of the  Covid-19 declaration under Section 564(b)(1) of the Act, 21  U.S.C. section 360bbb-3(b)(1), unless the authorization is  terminated or revoked. Performed at [facility], [address] [place], [place] [address]   Aerobic/Anaerobic Culture (surgical/deep wound)     Status: None   Collection Time: 11/20/19  1:39 PM   Specimen: Soft Tissue, Other  Result Value Ref Range Status   Specimen Description WOUND  Final   Special Requests LEFT TRANSMETARSAL  Final   Gram Stain   Final    RARE WBC PRESENT,BOTH PMN AND MONONUCLEAR NO ORGANISMS SEEN    Culture   Final    RARE PREVOTELLA BIVIA RARE GRANULICATELLA ADIACENS BETA LACTAMASE NEGATIVE FOR PREVOTELLA Performed at [facility], [address] [place], [place] [address]    Report Status 11/23/2019 FINAL  Final   Labs: CBC: Recent Labs  Lab 11/03/23 1937 11/03/23 1942  WBC 10.1  --   NEUTROABS 6.3  --   HGB 14.7 15.0  14.6  HCT 43.3 44.0  43.0  MCV 90.2  --   PLT 297  --    Basic Metabolic Panel: Recent Labs  Lab 11/03/23 1937 11/03/23 1942 11/04/23 0552 11/05/23 0803  NA 139 140  140 135 136  K 5.1 5.0  5.0 4.2 4.1  CL 105 107 104 103  CO2 20*  --  21* 23  GLUCOSE 283* 287* 322* 183*  BUN 17 19 19 11   CREATININE  2.04* 2.00* 1.95* 1.05  CALCIUM  9.1  --  8.4* 8.8*  MG 1.7  --  1.9  --   PHOS  --   --  4.2 3.1   Liver Function Tests: Recent Labs  Lab 11/03/23 1937 11/05/23 0803  AST 19  --   ALT 17  --   ALKPHOS 72  --   BILITOT 0.7  --   PROT 6.9  --   ALBUMIN  3.6 3.0*   CBG: Recent Labs  Lab 11/04/23 0602 11/04/23 1134 11/04/23 1606 11/04/23 2055 11/05/23 0559  GLUCAP 311* 197* 239* 233* 213*    Discharge time spent: greater than 30 minutes.  Author: Yetta Blanch, MD  [facility] 11/05/2023

## 2023-11-08 ENCOUNTER — Ambulatory Visit (INDEPENDENT_AMBULATORY_CARE_PROVIDER_SITE_OTHER): Admitting: Podiatry

## 2023-11-08 VITALS — Ht 69.0 in | Wt 235.0 lb

## 2023-11-08 DIAGNOSIS — Z794 Long term (current) use of insulin: Secondary | ICD-10-CM | POA: Diagnosis not present

## 2023-11-08 DIAGNOSIS — L97422 Non-pressure chronic ulcer of left heel and midfoot with fat layer exposed: Secondary | ICD-10-CM

## 2023-11-08 DIAGNOSIS — L84 Corns and callosities: Secondary | ICD-10-CM

## 2023-11-08 DIAGNOSIS — E1140 Type 2 diabetes mellitus with diabetic neuropathy, unspecified: Secondary | ICD-10-CM | POA: Diagnosis not present

## 2023-11-08 MED ORDER — DOXYCYCLINE HYCLATE 100 MG PO TABS: 100.0000 mg | ORAL_TABLET | Freq: Two times a day (BID) | ORAL | 0 refills | Status: DC

## 2023-11-09 ENCOUNTER — Ambulatory Visit: Admitting: Family Medicine

## 2023-11-09 NOTE — Telephone Encounter (Signed)
 Noted

## 2023-11-11 NOTE — Progress Notes (Signed)
 Subjective:   Patient ID: Danny Fuller, male   DOB: 48 y.o.   MRN: [id number]   HPI Chief Complaint  Patient presents with   Callouses    Rm 12 Patient is here for a pre-ulcerative callus of the left heel.    48 year old male presents the office today for follow-up evaluation of pre-ulcerative calluses bilaterally.  States he has been doing well does not report any fevers or chills.  He gets some discomfort in the area of the calluses mostly on the left side.  No fevers or chills.  Last A1c was 9.8 on May 10, 2023     Objective:  Physical Exam  General: AAO x3, NAD  Dermatological: Thick hyperkeratotic lesions noted to the left heel as well as right submetatarsal 1.  On the right foot submetatarsal 1 area is preulcerative there is no skin breakdown.  The left foot along the heel there was a skin breakdown roughly 2 cm in diameter and macerated tissue.  There is no probing to bone, undermining or tunneling.  There is some malodor coming from the wound bed and this is coming more from the macerated tissue.  There is no fluctuation or crepitation.   Vascular: Dorsalis Pedis artery and Posterior Tibial artery pedal pulses are palpable bilateral with immedate capillary fill time.  There is no pain with calf compression, swelling, warmth, erythema.   Neruologic: Sensation decreased  Musculoskeletal: Left transmetatarsal amputation.  Prominent submetatarsal 1 right foot.  He gets tenderness on the hyperkeratotic lesions.      Assessment:   Bilateral preulcerative calluses; symptomatic onychomycosis     Plan:  -Treatment options discussed including all alternatives, risks, and complications -Etiology of symptoms were discussed -Sharply debrided hyperkeratotic lesions x 2.  On left foot I sharply debrided the hyperkeratotic lesion to reveal underlying wound as noted below.  Aquacel was applied followed by dressing given supplies and recommend daily dressing changes.  Given his  history of TMA and a wound now on the heel we will referred him to the wound care center. Offloading and limit activity.  Doxycyline. -On the right sharply debrided the hyperkeratotic lesion any complications or bleeding.  Moisturizer, offloading.  Procedure: Excisional Debridement of Wound Tool: Sharp #312 chisel blade/tissue nipper Type of Debridement: Sharp Excisional Frequency: @periodically  until appropriately healed.  Dressing is to be changed daily/keeping the wound clean and dry Rationale: Removal of non-viable soft tissue from the wound to promote healing.  Anesthesia: none Pre-Debridement Wound Measurements: Unable to measure as it was covered with callus Post-Debridement Wound Measurements: 2 cm x 2 cm x 0.2 cm. Area devitalized tissue removed(nonviable tissue only): 2 cm x 2 cm.  Blood loss: Minimal (<50cc) Depth of Debridement: with fat layer exposed Description of tissue removed: Non-viable tissue Technique: The wound and the surrounding skin were prepped and draped in usual aseptic fashion.  Aseptic technique was maintained throughout the procedure.  Using #312 blade/tissue nipper sharp debridement of necrotic/nonviable tissue was performed until healthy bleeding wound bed was achieved.  No underlying bone or tendon was exposed during debridement.  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  Debridement removed 2 cm x 2 cm of the necrotic tissue and subcutaneous tissue and none amount of purulent drainage was not present. Presence/absence of tissue: Necrotic tissue/nonviable tissue present at the base of the wound.  Sharp debridement was performed  to remove the necrotic tissue/nonviable tissue back to viable tissue.  No devitalized/nonviable tissue present postdebridement.  Wound appeared clean and clear of infection No material  in the wound was present that was identified to be inhibiting healing. Dressing: Dry, sterile, compression dressing. Disposition: Patient tolerated procedure well. Patient to return in 1 week for follow-up or as listed above.  [name] DPM

## 2023-11-12 ENCOUNTER — Ambulatory Visit (HOSPITAL_BASED_OUTPATIENT_CLINIC_OR_DEPARTMENT_OTHER): Admitting: General Surgery

## 2023-11-22 ENCOUNTER — Ambulatory Visit (HOSPITAL_COMMUNITY)
Admission: RE | Admit: 2023-11-22 | Discharge: 2023-11-22 | Disposition: A | Discharge status: Home / Self Care | Source: Ambulatory Visit | Attending: Physician Assistant | Admitting: Physician Assistant

## 2023-11-22 ENCOUNTER — Encounter (HOSPITAL_COMMUNITY): Payer: Self-pay | Admitting: Physician Assistant

## 2023-11-22 VITALS — BP 170/90 | HR 86 | Ht 69.0 in | Wt 237.8 lb

## 2023-11-22 DIAGNOSIS — I480 Paroxysmal atrial fibrillation: Secondary | ICD-10-CM

## 2023-11-22 DIAGNOSIS — I10 Essential (primary) hypertension: Secondary | ICD-10-CM | POA: Diagnosis not present

## 2023-11-22 DIAGNOSIS — I4891 Unspecified atrial fibrillation: Secondary | ICD-10-CM

## 2023-11-22 DIAGNOSIS — D6869 Other thrombophilia: Secondary | ICD-10-CM | POA: Diagnosis not present

## 2023-11-22 NOTE — Progress Notes (Signed)
 Primary Care Physician: Johnny Garnette LABOR, MD Primary Cardiologist: Dr Lavona  Primary Electrophysiologist: none Referring Physician: [facility]   Danny Fuller Counter is a 48 y.o. male with a history of HTN, DM, HLD, OSA, aortic atherosclerosis, atrial fibrillation who presents for follow up in the [facility].  The patient was initially diagnosed with atrial fibrillation 10/21/21 after presenting to the ED with symptoms of weakness, fatigue, and palpitations. ECG showed afib with RVR. He spontaneously converted to SR. Patient was started on Eliquis  for stroke prevention and his labetalol  was increased. Patient decreased his labetalol  back to 200 mg BID due to dizziness.   Patient returns for follow up for atrial fibrillation. He was seen at the ED 11/03/23 for a syncopal event. He reported that his insulin  pump stopped working around 1 AM that day. He was [place] the [place] all day and when he was getting ready to leave he suddenly felt dizzy and passed out. After he regained consciousness he had some palpitations. EMS was activated. The patient was found to be in A-fib with RVR with rates in the 200s and low blood pressures with systolic in the 70s. Endorses he stopped taking his home Eliquis  for months. He was started on IV diltiazem  and spontaneously converted to SR. Of note, his Cr had increased from 1.16 to 2.04. CT of abdomen showed evidence of possible right cortical enhancement concerning for ischemic injury. He was discharged on PO diltiazem  and Eliquis  was resumed. Today, patient is in SR and feels well. No bleeding issues on anticoagulation.   Today, he  denies symptoms of palpitations, chest pain, shortness of breath, orthopnea, PND, lower extremity edema, dizziness, presyncope, syncope, snoring, daytime somnolence, bleeding, or neurologic sequela. The patient is tolerating medications without difficulties and is otherwise without complaint today.    Atrial  Fibrillation Risk Factors:  he does have symptoms or diagnosis of sleep apnea. he does not have a history of rheumatic fever. he does not have a history of alcohol use.   Atrial Fibrillation Management history:  Previous antiarrhythmic drugs: none Previous cardioversions: none Previous ablations: none Anticoagulation history: Eliquis    Past Medical History:  Diagnosis Date   Asthma    Back pain    Bulging Disk   Chronic kidney disease    Diabetes mellitus    sees Warren Batty NP at [facility]   Diabetic foot ulcers [facility])    sees Dr. Norleen Armor    GERD (gastroesophageal reflux disease)    Glaucoma    Headache(784.0)    Hyperlipidemia    Hypertension    sees Dr. Lavona    Sleep apnea    CPAP    Current Outpatient Medications  Medication Sig Dispense Refill   apixaban  (ELIQUIS ) 5 MG TABS tablet Take 1 tablet (5 mg total) by mouth 2 (two) times daily. 180 tablet 0   cloNIDine  (CATAPRES  - DOSED IN MG/24 HR) 0.3 mg/24hr patch Place 0.3 mg onto the skin once a week.     cyclobenzaprine  (FLEXERIL ) 10 MG tablet Take 1 tablet (10 mg total) by mouth 3 (three) times daily as needed for muscle spasms. 90 tablet 5   diltiazem  (CARDIZEM  CD) 180 MG 24 hr capsule Take 1 capsule (180 mg total) by mouth daily. 30 capsule 0   Dulaglutide  (TRULICITY ) 3 MG/0.5ML SOAJ Inject 3 mg as directed once a week.     insulin  aspart (NOVOLOG ) 100 UNIT/ML injection      Insulin   Disposable Pump (OMNIPOD DASH PODS, GEN 4,) MISC INJECT 1 EACH INTO THE SKIN CONTINUOUS. APPLY 1 OMNIPOD INSULIN  PUMP TO BODY DAILY FOR INSULIN  30 each 1   nortriptyline  (PAMELOR ) 10 MG capsule TAKE 4 CAPSULES BY MOUTH AT BEDTIME 360 capsule 3   olmesartan  (BENICAR ) 40 MG tablet Take 1 tablet (40 mg total) by mouth daily. 30 tablet 0   pravastatin  (PRAVACHOL ) 40 MG tablet TAKE 1 TABLET BY MOUTH EVERY  EVENING 100 tablet 0   silver  sulfADIAZINE  (SILVADENE ) 1 % cream Apply 1 Application topically daily. 50 g 2    traMADol  (ULTRAM ) 50 MG tablet Take 2 tablets (100 mg total) by mouth every 6 (six) hours as needed. (Patient taking differently: Take 100 mg by mouth as needed.) 240 tablet 1   [Paused] hydrALAZINE  (APRESOLINE ) 50 MG tablet Take 1 tablet (50 mg total) by mouth 3 (three) times daily. (Patient not taking: Reported on 11/22/2023) 90 tablet 5   [Paused] labetalol  (NORMODYNE ) 100 MG tablet Take 1 tablet (100 mg total) by mouth daily. (Patient not taking: Reported on 11/22/2023) 30 tablet 5   [Paused] pregabalin  (LYRICA ) 150 MG capsule Take 1 capsule (150 mg total) by mouth 3 (three) times daily. (Patient not taking: Reported on 11/22/2023) 90 capsule 5   No current facility-administered medications for this encounter.    ROS- All systems are reviewed and negative except as per the HPI above.  Physical Exam: Vitals:   11/22/23 0956  BP: (!) 170/90  Pulse: 86  Weight: 107.9 kg  Height: 5' 9 (1.753 m)     GEN: Well nourished, well developed in no acute distress CARDIAC: Regular rate and rhythm, no murmurs, rubs, gallops RESPIRATORY:  Clear to auscultation without rales, wheezing or rhonchi  ABDOMEN: Soft, non-tender, non-distended EXTREMITIES:  No edema; No deformity    Wt Readings from Last 3 Encounters:  11/22/23 107.9 kg  11/08/23 106.6 kg  11/03/23 106.6 kg    EKG today demonstrates  SR, LVH, LPFB Vent. rate 86 BPM PR interval 174 ms QRS duration 90 ms QT/QTcB 334/399 ms   Echo 11/04/23 demonstrated   1. Left ventricular ejection fraction, by estimation, is 60 to 65%. The  left ventricle has normal function. The left ventricle has no regional  wall motion abnormalities. There is moderate concentric left ventricular  hypertrophy. Left ventricular diastolic parameters were normal. The average left ventricular global longitudinal strain is -15.6 %. The global longitudinal strain is  abnormal.   2. Right ventricular systolic function is normal. The right ventricular  size is  normal. There is mildly elevated pulmonary artery systolic  pressure. The estimated right ventricular systolic pressure is 35.0 mmHg.   3. Left atrial size was mildly dilated.   4. The mitral valve is normal in structure. Mild mitral valve  regurgitation.   5. Tricuspid valve regurgitation is mild to moderate.   6. The aortic valve is tricuspid. Aortic valve regurgitation is not  visualized. No aortic stenosis is present.   7. The inferior vena cava is dilated in size with >50% respiratory  variability, suggesting right atrial pressure of 8 mmHg.   Comparison(s): Prior images reviewed side by side. Mean left atrial  pressure is higher. Right atrial pressure is higher.     Epic records are reviewed at length today   CHA2DS2-VASc Score = 5  The patient's score is based upon: CHF History: 0 HTN History: 1 Diabetes History: 1 Stroke History: 2 (possible renal infacrt) Vascular Disease History: 1 Age Score:  0 Gender Score: 0       ASSESSMENT AND PLAN: Paroxysmal Atrial Fibrillation (ICD10:  I48.0) The patient's CHA2DS2-VASc score is 5, indicating a 7.2% annual risk of stroke.   Patient in SR today but had highly symptomatic episode with rates in 200's associated with syncope.  We discussed rhythm control options today including AAD (Multaq, dofetilide, amiodarone) and ablation. He is agreeable to discussing ablation with EP. He plans to discuss options with his wife if he would like to start AAD as a bridge to ablation.  Continue Eliquis  5 mg BID Continue diltiazem  180 mg daily  Secondary Hypercoagulable State (ICD10:  D68.69) The patient is at significant risk for stroke/thromboembolism based upon his CHA2DS2-VASc Score of 5.  Continue Apixaban  (Eliquis ). No bleeding issues.   Obesity Body mass index is 35.12 kg/m.  Encouraged lifestyle modification Has lost a considerable amount of weight over the years, previously 370 lbs.   OSA  Repeat sleep study 12/2021 showed mild  OSA He no longer had a CPAP and is not interested in resuming.  HTN Remains elevated today. Historically difficult to control.  Will refer him to the HTN clinic for medication titration.    Follow up with EP to establish care and discuss ablation. Follow up in AF clinic pending decision regarding AAD.    Daril Kicks PA-C [facility] [address] [place], [place] [address] [phone] 11/22/2023 11:38 AM

## 2023-11-23 ENCOUNTER — Other Ambulatory Visit: Payer: Self-pay | Admitting: Family Medicine

## 2023-11-23 ENCOUNTER — Ambulatory Visit (INDEPENDENT_AMBULATORY_CARE_PROVIDER_SITE_OTHER): Admitting: Podiatry

## 2023-11-23 ENCOUNTER — Encounter: Payer: Self-pay | Admitting: Podiatry

## 2023-11-23 DIAGNOSIS — E1140 Type 2 diabetes mellitus with diabetic neuropathy, unspecified: Secondary | ICD-10-CM | POA: Diagnosis not present

## 2023-11-23 DIAGNOSIS — L84 Corns and callosities: Secondary | ICD-10-CM | POA: Diagnosis not present

## 2023-11-23 DIAGNOSIS — L97421 Non-pressure chronic ulcer of left heel and midfoot limited to breakdown of skin: Secondary | ICD-10-CM

## 2023-11-23 DIAGNOSIS — Z794 Long term (current) use of insulin: Secondary | ICD-10-CM

## 2023-11-25 NOTE — Progress Notes (Signed)
 Subjective:   Patient ID: Danny Fuller, male   DOB: 48 y.o.   MRN: [id number]   HPI Chief Complaint  Patient presents with   Foot Ulcer    F/U ulcer Left foot heel. Transmet amputation. Right foot sub 1st met. 75% better. 1 pain. IDDM A1C 8.1.     48 year old male presents the office today for follow-up evaluation of pre-ulcerative calluses bilaterally.  States he does have an appointment wound care center.  Denies any drainage or pus.  Overall he is feeling better.  No swelling or redness.  No fevers or chills.  No other concerns.    Last A1c was 9.8 on May 10, 2023     Objective:  Physical Exam  General: AAO x3, NAD  Dermatological: Thick hyperkeratotic lesions noted to the left heel as well as right submetatarsal 1.  Upon debridement of right foot submetatarsal 1 the area is preulcerative.  Upon debridement of the left heel there is a superficial area of skin breakdown but the center is in diameter still present.  There is no surrounding erythema, ascending cellulitis.  No drainage or pus.  No fluctuation or crepitation.  There is no malodor.  No other open lesions are identified.  Vascular: Dorsalis Pedis artery and Posterior Tibial artery pedal pulses are palpable bilateral with immedate capillary fill time.  There is no pain with calf compression, swelling, warmth, erythema.   Neruologic: Sensation decreased  Musculoskeletal: Left transmetatarsal amputation.  Prominent submetatarsal 1 right foot.  He gets tenderness on the hyperkeratotic lesions.      Assessment:   Bilateral preulcerative calluses; symptomatic onychomycosis     Plan:  -Treatment options discussed including all alternatives, risks, and complications -Etiology of symptoms were discussed -Sharply debrided hyperkeratotic lesions x 2.  Superficial wound still present in the left foot.  There is no signs of infection today so we will hold off on further antibiotics.  Recommended Silvadene  dressing changes  daily which he already has.  For the right side discussed urea  cream.  Once the wound is on the left heel he can also use the urea . -Monitor for any clinical signs or symptoms of infection and directed to call the office immediately should any occur or go to the ER.  Return in about 2 weeks (around 12/07/2023) for ulcer left heel .  [name] DPM

## 2023-11-27 ENCOUNTER — Encounter (HOSPITAL_BASED_OUTPATIENT_CLINIC_OR_DEPARTMENT_OTHER): Attending: Internal Medicine | Admitting: Internal Medicine

## 2023-11-27 DIAGNOSIS — Z09 Encounter for follow-up examination after completed treatment for conditions other than malignant neoplasm: Secondary | ICD-10-CM | POA: Diagnosis present

## 2023-11-27 DIAGNOSIS — L84 Corns and callosities: Secondary | ICD-10-CM | POA: Insufficient documentation

## 2023-11-27 DIAGNOSIS — Z872 Personal history of diseases of the skin and subcutaneous tissue: Secondary | ICD-10-CM | POA: Insufficient documentation

## 2023-11-27 DIAGNOSIS — Z89432 Acquired absence of left foot: Secondary | ICD-10-CM | POA: Diagnosis not present

## 2023-11-27 DIAGNOSIS — E1165 Type 2 diabetes mellitus with hyperglycemia: Secondary | ICD-10-CM | POA: Insufficient documentation

## 2023-12-04 ENCOUNTER — Telehealth: Payer: Self-pay

## 2023-12-04 NOTE — Telephone Encounter (Signed)
 Patient was identified as falling into the [place] Measure - Diabetes.   Patient was: Appointment already scheduled for:  04/03/2024 with Dr. Warren Batty. Patient was referred to Endocrinology by Dr. Johnny on 10/19/2023.

## 2023-12-06 ENCOUNTER — Encounter (HOSPITAL_BASED_OUTPATIENT_CLINIC_OR_DEPARTMENT_OTHER): Payer: Self-pay | Admitting: Family

## 2023-12-06 ENCOUNTER — Ambulatory Visit (INDEPENDENT_AMBULATORY_CARE_PROVIDER_SITE_OTHER): Admitting: Family

## 2023-12-06 VITALS — BP 174/92 | HR 84 | Ht 70.0 in | Wt 245.4 lb

## 2023-12-06 DIAGNOSIS — D6869 Other thrombophilia: Secondary | ICD-10-CM | POA: Diagnosis not present

## 2023-12-06 DIAGNOSIS — Z794 Long term (current) use of insulin: Secondary | ICD-10-CM

## 2023-12-06 DIAGNOSIS — G4733 Obstructive sleep apnea (adult) (pediatric): Secondary | ICD-10-CM

## 2023-12-06 DIAGNOSIS — E1140 Type 2 diabetes mellitus with diabetic neuropathy, unspecified: Secondary | ICD-10-CM

## 2023-12-06 DIAGNOSIS — I10 Essential (primary) hypertension: Secondary | ICD-10-CM | POA: Diagnosis not present

## 2023-12-06 DIAGNOSIS — I480 Paroxysmal atrial fibrillation: Secondary | ICD-10-CM

## 2023-12-06 MED ORDER — OLMESARTAN MEDOXOMIL 40 MG PO TABS: 40.0000 mg | ORAL_TABLET | Freq: Every day | ORAL | 2 refills | Status: DC

## 2023-12-06 MED ORDER — COMFORT TOUCH BP CUFF/LARGE MISC: 1.0000 [IU] | Freq: Every day | 0 refills | Status: AC

## 2023-12-06 MED ORDER — DILTIAZEM HCL ER COATED BEADS 240 MG PO CP24: 240.0000 mg | ORAL_CAPSULE | Freq: Every day | ORAL | 2 refills | Status: DC

## 2023-12-06 NOTE — Progress Notes (Addendum)
 [facility] Initial Assessment:    Date:  12/06/2023   ID:  Danny Fuller, DOB 1975-11-09, MRN [id number]  PCP:  Danny Garnette LABOR, MD  Cardiologist:  Danny Schilling, MD  Nephrologist:  Referring MD: Danny Quita SAUNDERS, PA   CC: Hypertension  History of Present Illness:    Discussed the use of AI scribe software for clinical note transcription with the patient, who gave verbal consent to proceed.  History of Present Illness Danny Fuller is a 48 year old male with hypertension, DM 2, OSA, HLD, atrial fibrillation who presents for management of uncontrolled hypertension.  Echo 11/2021 LVEF 60 to 65%, due to strain recommended for follow-up testing.  Amyloid study equivocal.  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  This is secondary to PAF.  Diltiazem  drip started and discharged on p.o. diltiazem .  Echo preserved LVEF, moderate LVH, mildly elevated PASP, mild MR, mild to moderate TR.  Eliquis  was resumed.  He had AKI, CT abdomen pelvis possible right cortical enhancement concerning for ischemic injury, renal function improved after IVF.  He was discharged on Cardizem , olmesartan .  Labetalol  and hydralazine  were discontinued.  Presents today to establish with [facility].  Hypertension was diagnosed at age 48, and he struggles with control despite lifestyle changes. He experiences side effects from antihypertensive medications, including sluggishness and fatigue, leading to occasional non-adherence. He is currently on olmesartan  and a clonidine  patch but has run out of diltiazem  as of yesterday and is not using hydralazine  or labetalol . He often forgets doses and prefers a simplified regimen.  Family  history includes heart disease and hypertension in his father and maternal grandfather. He has tried multiple medications, including lisinopril , amlodipine , and chlorthalidone , with adverse effects such as dehydration and fatigue.  Atrial fibrillation is managed with Eliquis , and he is scheduled for a specialist consultation for potential ablation. He last took diltiazem  yesterday and sometimes forgets to replace the clonidine  patch.  A CT scan in September 2025 ruled out pheochromocytoma. An ultrasound of the kidney arteries has not been performed. Thyroid  function was normal in April 2025.    Previous antihypertensives: Labetolol - felt funny Amlodipine  -  Lisinopril  -  Hydralazine  -  Olmesartan -hydrochlorothiazide -  Chlorthalidone  - dehydrated, always thirsty Lasix  - stopped due to kidney function  Past Medical History:  Diagnosis Date   Arrhythmia 10/14/2017   Arthritis    Asthma    Back pain    Bulging Disk   Chronic kidney disease    Diabetes mellitus    sees Danny Batty NP at [facility]   Diabetic foot ulcers [facility])    sees Dr. Norleen Fuller    GERD (gastroesophageal reflux disease)    Glaucoma    Headache(784.0)    Hyperlipidemia    Hypertension    sees [facility]    Neuromuscular disorder (HCC) 07/2015   Sleep apnea    CPAP    Past Surgical History:  Procedure Laterality Date   AMPUTATION Left 07/26/2015   Procedure: AMPUTATION LEFT FIFTH RAY;  Surgeon: Danny LULLA Sage, MD;  Location: [facility] OR;  Service: Orthopedics;  Laterality: Left;   bilateral hip pins placed     INCISION AND DRAINAGE PERIRECTAL ABSCESS Right 03/07/2016  Procedure: IRRIGATION AND DEBRIDEMENT PERIRECTAL ABSCESS;  Surgeon: Danny Angle, MD;  Location: [facility] ORS;  Service: General;  Laterality: Right;   INCISION AND DRAINAGE PERIRECTAL ABSCESS Right 03/09/2016   Procedure: EXAM UNDER ANESTHESIA, IRRIGATION AND DEBRIDEMENT PERIRECTAL ABSCESS;  Surgeon: Danny Lunger, MD;  Location: [facility]  ORS;  Service: General;  Laterality: Right;  Open, betadine packed wound   TOE AMPUTATION Left 11/19/2019   Removal of all toes from left foot   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/20/2019   Procedure: TRANSMETATARSAL AMPUTATION LEFT FOOT;  Surgeon: Danny Rush, MD;  Location: [facility];  Service: Orthopedics;  Laterality: Left;    Current Medications: Current Meds  Medication Sig   apixaban  (ELIQUIS ) 5 MG TABS tablet Take 1 tablet (5 mg total) by mouth 2 (two) times daily.   Blood Pressure Monitoring (COMFORT TOUCH BP CUFF/LARGE) MISC 1 Units by Does not apply route daily.   cloNIDine  (CATAPRES  - DOSED IN MG/24 HR) 0.3 mg/24hr patch Place 0.3 mg onto the skin once a week.   cyclobenzaprine  (FLEXERIL ) 10 MG tablet Take 1 tablet (10 mg total) by mouth 3 (three) times daily as needed for muscle spasms.   Dulaglutide  (TRULICITY ) 3 MG/0.5ML SOAJ Inject 3 mg as directed once a week.   insulin  aspart (NOVOLOG ) 100 UNIT/ML injection    Insulin  Disposable Pump (OMNIPOD DASH PODS, GEN 4,) MISC INJECT 1 EACH INTO THE SKIN CONTINUOUS. APPLY 1 OMNIPOD INSULIN  PUMP TO BODY DAILY FOR INSULIN    nortriptyline  (PAMELOR ) 10 MG capsule TAKE 4 CAPSULES BY MOUTH AT BEDTIME   pravastatin  (PRAVACHOL ) 40 MG tablet TAKE 1 TABLET BY MOUTH EVERY  EVENING   silver  sulfADIAZINE  (SILVADENE ) 1 % cream Apply 1 Application topically daily.   traMADol  (ULTRAM ) 50 MG tablet Take 2 tablets (100 mg total) by mouth every 6 (six) hours as needed. (Patient taking differently: Take 100 mg by mouth as needed.)   [DISCONTINUED] diltiazem  (CARDIZEM  CD) 180 MG 24 hr capsule Take 1 capsule (180 mg total) by mouth daily.   [DISCONTINUED] olmesartan  (BENICAR ) 40 MG tablet Take 1 tablet (40 mg total) by mouth daily.     Allergies:   Augmentin  [amoxicillin -pot clavulanate] and Vancomycin    Social History   Socioeconomic History   Marital status: Married    Spouse name: Not on file   Number of children: 2   Years of education: 12    Highest education level: 12th grade  Occupational History   Occupation: disability  Tobacco Use   Smoking status: Former    Current packs/day: 0.00    Average packs/day: 0.5 packs/day for 28.2 years (14.1 ttl pk-yrs)    Types: Cigarettes    Start date: 07/23/1995    Quit date: 10/15/2023    Years since quitting: 0.1    Passive exposure: Past   Smokeless tobacco: Never   Tobacco comments:    Former smoker 10/27/21, quit smoking 10/2023  Vaping Use   Vaping status: Never Used  Substance and Sexual Activity   Alcohol use: Yes    Alcohol/week: 3.0 standard drinks of alcohol    Types: 2 Cans of beer, 1 Standard drinks or equivalent per week    Comment: 1 drink every 1-3 months 10/27/21   Drug use: Never   Sexual activity: Yes    Birth control/protection: Condom  Other Topics Concern   Not on file  Social History Narrative   Lives with wife and two children in a one story home.  Not working.  Still trying for disability.  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     Education: high school.    Left handed   One story home   Social Drivers of Health   Financial Resource Strain: Medium Risk (07/30/2023)   Overall Financial Resource Strain (CARDIA)    Difficulty of Paying Living Expenses: Somewhat hard  Food Insecurity: No Food Insecurity (11/04/2023)   Hunger Vital Sign    Worried About Running Out of Food in the Last Year: Never true    Ran Out of Food in the Last Year: Never true  Transportation Needs: No Transportation Needs (11/04/2023)   PRAPARE - Administrator, Civil Service (Medical): No    Lack of Transportation (Non-Medical): No  Physical Activity: Insufficiently Active (07/30/2023)   Exercise Vital Sign    Days of Exercise per Week: 1 day    Minutes of Exercise per Session: 20 min  Stress: No Stress Concern Present (07/30/2023)   [employer] of Occupational Health - Occupational Stress Questionnaire    Feeling of Stress: Not at all   Social Connections: Socially Integrated (07/30/2023)   Social Connection and Isolation Panel    Frequency of Communication with Friends and Family: More than three times a week    Frequency of Social Gatherings with Friends and Family: Three times a week    Attends Religious Services: More than 4 times per year    Active Member of Clubs or Organizations: Yes    Attends Engineer, Structural: More than 4 times per year    Marital Status: Married     Family History: The patient's family history includes Diabetes in his brother, mother, and sister; Heart attack in his father; Hypertension in his father and maternal grandfather.  ROS:   Please see the history of present illness.     All other systems reviewed and are negative.  EKGs/Labs/Other Studies Reviewed:         Recent Labs: 05/21/2023: TSH 1.69 11/03/2023: ALT 17; Hemoglobin 14.6; Hemoglobin 15.0; Platelets 297 11/04/2023: Magnesium  1.9 11/05/2023: BUN 11; Creatinine, Ser 1.05; Potassium 4.1; Sodium 136   Recent Lipid Panel    Component Value Date/Time   CHOL 148 05/21/2023 1006   TRIG 110.0 05/21/2023 1006   HDL 31.00 (L) 05/21/2023 1006   CHOLHDL 5 05/21/2023 1006   VLDL 22.0 05/21/2023 1006   LDLCALC 95 05/21/2023 1006   LDLDIRECT 85.0 06/05/2019 0832    Physical Exam:   VS:  BP (!) 174/92 (BP Location: Left Arm, Patient Position: Sitting, Cuff Size: Large)   Pulse 84   Ht 5' 10 (1.778 m)   Wt 245 lb 6.4 oz (111.3 kg)   SpO2 97%   BMI 35.21 kg/m  , BMI Body mass index is 35.21 kg/m. GENERAL:  Well appearing HEENT: Pupils equal round and reactive, fundi not visualized, oral mucosa unremarkable NECK:  No jugular venous distention, waveform within normal limits, carotid upstroke brisk and symmetric, no bruits, no thyromegaly LYMPHATICS:  No cervical adenopathy LUNGS:  Clear to auscultation bilaterally HEART:  RRR.  PMI not displaced or sustained,S1 and S2 within normal limits, no S3, no S4, no clicks, no  rubs, no murmurs ABD:  Flat, positive bowel sounds normal in frequency in pitch, no bruits, no rebound, no guarding, no midline pulsatile mass, no hepatomegaly, no splenomegaly EXT:  2 plus pulses throughout, no edema, no cyanosis no clubbing SKIN:  No rashes no nodules NEURO:  Cranial nerves II through XII grossly intact, motor grossly intact  throughout [facility]:  Cognitively intact, oriented to person place and time   ASSESSMENT/PLAN:    Assessment & Plan Difficult-to-control hypertension Hypertension difficult to control despite multiple medications. Considered renal artery stenosis, hyperaldosteronism, and sleep apnea as contributing factors. Discussed renal denervation as a future option. - Refill olmesartan  40mg  daily and clonidine  0.3mg  weekly patch. - Increase diltiazem  dose from 180 mg to 240 mg. - Order blood work for hyperaldosteronism. - Order renal artery ultrasound. - Encouraged to purchase upper arm cuff. Discussed to monitor BP at home at least 2 hours after medications and sitting for 5-10 minutes.   Mild obstructive sleep apnea Mild obstructive sleep apnea may contribute to hypertension if untreated. Discussed CPAP benefits in lowering blood pressure. Has not used in some time. Sleeps well, does not snore.  - Consult Dr. Shlomo for CPAP use input. -Encouraged to resume CPAP Addendum 12/11/23 - [place] home sleep study ordered. Recommended for re-evaluation by Dr. Shlomo  Atrial fibrillation Chronic atrial fibrillation managed with Eliquis  and diltiazem . RRR by auscultation - Continue Eliquis  5mg  bid, denies bleeding complications - Increase diltiazem  dose from 180 mg to 240 mg daily for BP control - Follow up with Dr. Nancey on November 14th as scheduled to discuss possible ablation     Screening for Secondary Hypertension:     Relevant Labs/Studies:    Latest Ref Rng & Units 11/05/2023    8:03 AM 11/04/2023    5:52 AM 11/03/2023    7:42 PM  Basic Labs  Sodium  135 - 145 mmol/L 136  135  140    140   Potassium 3.5 - 5.1 mmol/L 4.1  4.2  5.0    5.0   Creatinine 0.61 - 1.24 mg/dL 8.94  8.04  7.99        Latest Ref Rng & Units 05/21/2023   10:06 AM 10/21/2021   11:18 AM  Thyroid    TSH 0.35 - 5.50 uIU/mL 1.69  1.515                 12/06/2023    9:32 AM  Renovascular   Renal Artery US  Completed Yes       Disposition:    FU with MD/APP/PharmD in 6 weeks   Medication Adjustments/Labs and Tests Ordered: Current medicines are reviewed at length with the patient today.  Concerns regarding medicines are outlined above.  Orders Placed This Encounter  Procedures   Aldosterone + renin activity w/ ratio   VAS US  RENAL ARTERY DUPLEX   Meds ordered this encounter  Medications   Blood Pressure Monitoring (COMFORT TOUCH BP CUFF/LARGE) MISC    Sig: 1 Units by Does not apply route daily.    Dispense:  1 each    Refill:  0    Supervising Provider:   LONNI SLAIN [[id number]]   diltiazem  (CARDIZEM  CD) 240 MG 24 hr capsule    Sig: Take 1 capsule (240 mg total) by mouth daily.    Dispense:  30 capsule    Refill:  2    Supervising Provider:   CHRISTOPHER, BRIDGETTE [[id number]]   olmesartan  (BENICAR ) 40 MG tablet    Sig: Take 1 tablet (40 mg total) by mouth daily.    Dispense:  30 tablet    Refill:  2    Supervising Provider:   LONNI SLAIN [[id number]]     Signed, Reche GORMAN Finder, NP  12/06/2023 2:12 PM    [facility]

## 2023-12-06 NOTE — Patient Instructions (Addendum)
 Medication Instructions:   CHANGE Diltiazem  to 240mg  once per day  CONTINUE Clonidine  0.3mg  patch once per week  CONTINUE Olmesartan  40mg  once per day   Labwork: Your physician recommends that you return for lab work today: renin-aldosterone ratio   Testing/Procedures: Your physician has requested that you have a renal artery duplex. During this test, an ultrasound is used to evaluate blood flow to the kidneys. Allow one hour for this exam. Do not eat after midnight the day before and avoid carbonated beverages. Take your medications as you usually do.    Follow-Up: Please follow up as scheduled in November   Special Instructions:    Please check blood pressure once per day and keep a log to bring to your next office visit.   Please resume using CPAP as this can help your blood pressure.

## 2023-12-08 DIAGNOSIS — G4733 Obstructive sleep apnea (adult) (pediatric): Secondary | ICD-10-CM | POA: Diagnosis not present

## 2023-12-10 ENCOUNTER — Encounter (HOSPITAL_BASED_OUTPATIENT_CLINIC_OR_DEPARTMENT_OTHER): Payer: Self-pay

## 2023-12-10 ENCOUNTER — Encounter (HOSPITAL_BASED_OUTPATIENT_CLINIC_OR_DEPARTMENT_OTHER): Admitting: General Surgery

## 2023-12-10 ENCOUNTER — Other Ambulatory Visit: Payer: Self-pay | Admitting: Family Medicine

## 2023-12-11 ENCOUNTER — Ambulatory Visit (INDEPENDENT_AMBULATORY_CARE_PROVIDER_SITE_OTHER): Admitting: Podiatry

## 2023-12-11 ENCOUNTER — Encounter: Payer: Self-pay | Admitting: Podiatry

## 2023-12-11 DIAGNOSIS — E1140 Type 2 diabetes mellitus with diabetic neuropathy, unspecified: Secondary | ICD-10-CM

## 2023-12-11 DIAGNOSIS — L84 Corns and callosities: Secondary | ICD-10-CM | POA: Diagnosis not present

## 2023-12-11 DIAGNOSIS — Z794 Long term (current) use of insulin: Secondary | ICD-10-CM | POA: Diagnosis not present

## 2023-12-11 NOTE — Progress Notes (Signed)
 Subjective:   Patient ID: [facility], male   DOB: 48 y.o.   MRN: [id number]   HPI Chief Complaint  Patient presents with   Diabetic Ulcer    Rm12 F/u Ulceration bilateral feet/ neuropathy /A110c 21.28    48 year old male presents the office today for follow-up evaluation of pre-ulcerative calluses bilaterally.  He states he has some soreness to the area but otherwise he has not seen any open wounds.  He did go to the wound care center by the time the wounds are already healed.  He does not report any recent injuries or changes.  Last A1c was 9.8 on May 10, 2023     Objective:  Physical Exam  General: AAO x3, NAD  Dermatological: Thick hyperkeratotic lesions noted to the left heel as well as right submetatarsal 1.  Upon debridement there was very minimal pinpoint bleeding noted to both lesions as there is some dried blood present the callus but after debridement there is no other ulceration identified at this time.  The callus is not as thick as what it had been previously.  There is no surrounding erythema, ascending cellulitis.  No drainage or pus.  No fluctuation or crepitation.  No malodor.  Vascular: Dorsalis Pedis artery and Posterior Tibial artery pedal pulses are palpable bilateral with immedate capillary fill time.  There is no pain with calf compression, swelling, warmth, erythema.   Neruologic: Sensation decreased  Musculoskeletal: Left transmetatarsal amputation.  Prominent submetatarsal 1 right foot.  He gets tenderness on the hyperkeratotic lesions.      Assessment:   Bilateral preulcerative calluses; symptomatic onychomycosis     Plan:  -Treatment options discussed including all alternatives, risks, and complications -Etiology of symptoms were discussed -Sharply debrided hyperkeratotic lesions x 2.  Very minimal bleeding occurred today.  Small amount of antibiotic ointment was applied followed by dressing.  Discussed daily dressing use, offloading.  Discussed  urea  cream to both calluses.  He has tried shoes, inserts but he states it feels like makes his foot roll. I am going to have him follow-up with Lolita, pedorthist for possible bracing particularly on the left side. -Monitor for any clinical signs or symptoms of infection and directed to call the office immediately should any occur or go to the ER.  Danny [facility] DPM

## 2023-12-11 NOTE — Patient Instructions (Signed)
 Monitor for any signs/symptoms of infection. Call the office immediately if any occur or go directly to the emergency room. Call with any questions/concerns.

## 2023-12-11 NOTE — Addendum Note (Signed)
 Addended by: Aydeen Blume S on: 12/11/2023 07:29 AM   Modules accepted: Orders

## 2023-12-12 ENCOUNTER — Ambulatory Visit (HOSPITAL_BASED_OUTPATIENT_CLINIC_OR_DEPARTMENT_OTHER): Payer: Self-pay | Admitting: Family

## 2023-12-12 LAB — ALDOSTERONE + RENIN ACTIVITY W/ RATIO
Aldos/Renin Ratio: 4.1 (ref 0.0–30.0)
Aldosterone: 2.9 ng/dL (ref 0.0–30.0)
Renin Activity, Plasma: 0.716 ng/mL/h (ref 0.167–5.380)

## 2023-12-18 ENCOUNTER — Other Ambulatory Visit: Payer: Self-pay | Admitting: Family Medicine

## 2023-12-24 ENCOUNTER — Ambulatory Visit

## 2023-12-24 DIAGNOSIS — M2141 Flat foot [pes planus] (acquired), right foot: Secondary | ICD-10-CM

## 2023-12-24 DIAGNOSIS — L97422 Non-pressure chronic ulcer of left heel and midfoot with fat layer exposed: Secondary | ICD-10-CM

## 2023-12-24 DIAGNOSIS — L84 Corns and callosities: Secondary | ICD-10-CM

## 2023-12-24 DIAGNOSIS — Z89432 Acquired absence of left foot: Secondary | ICD-10-CM

## 2023-12-24 DIAGNOSIS — E1140 Type 2 diabetes mellitus with diabetic neuropathy, unspecified: Secondary | ICD-10-CM

## 2023-12-26 LAB — LAB REPORT - SCANNED
Albumin, Urine POC: 366.8
Creatinine, POC: 156.9 mg/dL
EGFR: 86
Microalb Creat Ratio: 234

## 2023-12-27 NOTE — Progress Notes (Signed)
 Patient presents to the office today for diabetic shoe and insole measuring.  Patient was measured with brannock device to determine size and width for 1 pair of extra depth shoes, 3RT inserts, LT toefiller and custom [place]  Optima / richie style ankle brace   Documentation of medical necessity will be sent to patient's treating diabetic doctor to verify and sign.   Patient's diabetic provider: Harlene Batty / Pt. Took ppw to Dr as well   Shoes and insoles will be ordered at that time and patient will be notified for an appointment for fitting when they arrive.  [id number] [id number]  Custom inserts 3 right and Left TF   Lolita Schultze CPed

## 2023-12-27 NOTE — Progress Notes (Unsigned)
 Electrophysiology Office Note:    Date:  12/28/2023   ID:  [facility], DOB 09-Oct-1975, MRN [id number]  PCP:  Johnny Garnette LABOR, MD   [facility] Providers Cardiologist:  Lynwood Schilling, MD     Referring MD: Nellene Quita SAUNDERS, PA   History of Present Illness:    [facility] is a 48 y.o. male with a medical history significant for atrial fibrillation, HTN, DM, OSA referred for arrhythmia management.      Discussed the use of AI scribe software for clinical note transcription with the patient, who gave verbal consent to proceed.  History of Present Illness [facility] is a 48 year old male with atrial fibrillation who presents with recurrent atrial fibrillation episodes.  He experienced his first atrial fibrillation episode approximately two years ago. On November 08, 2023, he presented to the ER with palpitations and syncope, found to be in atrial fibrillation with a rapid ventricular response at about 200 beats per minute. This episode resulted in loss of consciousness, though he does not recall it. No further episodes have occurred since that ER visit.  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 Hydralazine  had recently been initiated for blood pressure management, after which he noticed a change in how he felt, though he cannot definitively link it to the medication. 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  He has diabetes and sleep apnea. Spironolactone was recently added to his medication regimen but has not been started as it is not yet available at the pharmacy. He monitors his blood pressure at home but requires a more accurate machine.         Today, he reports he feels well and has no complaints  EKGs/Labs/Other Studies Reviewed Today:     Echocardiogram:  TTE November 04, 2023 LVEF 60 to 65%.  Moderate concentric LVH.   Mildly dilated left atrium.   Advanced imaging:  Cardiac MRI February 2024 Severe concentric LVH, EF 41%.  No evidence of amyloidosis.  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   EKG:   EKG Interpretation Date/Time:  Friday December 28 2023 09:03:12 EST Ventricular Rate:  97 PR Interval:  174 QRS Duration:  88 QT Interval:  326 QTC Calculation: 414 R Axis:   109  Text Interpretation: Normal sinus rhythm Rightward axis When compared with ECG of 22-Nov-2023 10:06, No significant change was found Confirmed by Nancey Scotts [phone]) on 12/28/2023 9:11:33 AM     Physical Exam:    VS:  BP (!) 150/100 (BP Location: Right Arm, Patient Position: Sitting, Cuff Size: Large)   Pulse 97   Ht 5' 10 (1.778 m)   Wt 244 lb (110.7 kg)   SpO2 98%   BMI 35.01 kg/m     Wt Readings from Last 3 Encounters:  12/28/23 244 lb (110.7 kg)  12/06/23 245 lb 6.4 oz (111.3 kg)  11/22/23 237 lb 12.8 oz (107.9 kg)     GEN: Well nourished, well developed in no acute distress CARDIAC: RRR, no murmurs, rubs, gallops RESPIRATORY:  Normal work of breathing MUSCULOSKELETAL: no edema    ASSESSMENT & PLAN:     Paroxysmal atrial fibrillation Recurrent, symptomatic Episodes with extreme RVR We discussed management options and using a shared decision making approach decided to schedule ablation. I explained risks including minor bleeding at access site, complications requiring prolonged hospital stay, surgery,  pacemaker, or outcome  resulting in stroke heart attack or death.  Secondary hypercoagulable state CHA2DS2-VASc score of 5 Continue apixaban  5 mg twice daily  Obstructive sleep apnea We discussed the importance of CPAP for maintaining sinus rhythm  Hypertension BP significantly elevated today Continue clonidine  patch, diltiazem  to 40, olmesartan  40 mg Reports that spironolactone was recently prescribed by his nephrologist but he has not yet picked it up I advised him to go and start  taking the spironolactone  Type 2 diabetes On insulin  pump    Signed, [name], MD  12/28/2023 9:13 AM    [facility]

## 2023-12-28 ENCOUNTER — Ambulatory Visit: Attending: Cardiovascular Disease | Admitting: Cardiovascular Disease

## 2023-12-28 ENCOUNTER — Encounter: Payer: Self-pay | Admitting: Cardiovascular Disease

## 2023-12-28 VITALS — BP 150/100 | HR 97 | Ht 70.0 in | Wt 244.0 lb

## 2023-12-28 DIAGNOSIS — Z01812 Encounter for preprocedural laboratory examination: Secondary | ICD-10-CM | POA: Diagnosis not present

## 2023-12-28 DIAGNOSIS — I480 Paroxysmal atrial fibrillation: Secondary | ICD-10-CM | POA: Diagnosis not present

## 2023-12-28 NOTE — Patient Instructions (Signed)
 Medication Instructions:  Your physician recommends that you continue on your current medications as directed. Please refer to the Current Medication list given to you today.  *If you need a refill on your cardiac medications before your next appointment, please call your pharmacy*  Testing/Procedures: Ablation Your physician has recommended that you have an ablation. Catheter ablation is a medical procedure used to treat some cardiac arrhythmias (irregular heartbeats). During catheter ablation, a long, thin, flexible tube is put into a blood vessel in your groin (upper thigh), or neck. This tube is called an ablation catheter. It is then guided to your heart through the blood vessel. Radio frequency waves destroy small areas of heart tissue where abnormal heartbeats may cause an arrhythmia to start.   You are scheduled for Atrial Fibrillation Ablation on Tuesday, January 6 with Dr. Dr. Nancey. Please arrive at the Main Entrance A at [facility]: [address] [place], [place] [address] at 9:30 AM   What To Expect:  Labs: you will need to have lab work drawn within 30 days of your procedure. Please go to any [facility] location to have these drawn - no appointment is needed. Cardiac CT Scan: this will be done about 3-4 weeks prior to your procedure. You will be contacted to schedule this test. You will receive procedure instructions either through MyChart or in the mail 4-6 week prior to your procedure.  After your procedure we recommend no driving for 4 days, no lifting over 5 lbs for 7 days, and no work or strenuous activity for 7 days.  Please contact our office at [phone] if you have any questions.    Follow-Up: We will contact you to schedule your post-procedure appointments.  Your Cardiac CT has been scheduled on                     at the following location:  [facility] [address]  [place], [place] [address]  Please enter the parking lot  using the [address] entrance and use the FREE valet service at the patient drop-off area. Enter the buidling and check-in with registration on the main floor.  Please follow these instructions carefully (unless otherwise directed):  An IV will be required for this test.  On the Night Before the Test: Be sure to Drink plenty of water. Do not consume any caffeinated/decaffeinated beverages or chocolate 12 hours prior to your test. Do not take any antihistamines 12 hours prior to your test.  On the Day of the Test: Drink plenty of water until 1 hour prior to the test. Do not eat any food 1 hour prior to test. You may take your regular medications prior to the test.  If you take Furosemide /Hydrochlorothiazide/Spironolactone/Chlorthalidone , please HOLD on the morning of the test. Patients who wear a continuous glucose monitor MUST remove the device prior to scanning. FEMALES- please wear underwire-free bra if available, avoid dresses & tight clothing  After the Test: Drink plenty of water. After receiving IV contrast, you may experience a mild flushed feeling. This is normal. On occasion, you may experience a mild rash up to 24 hours after the test. This is not dangerous. If this occurs, you can take Benadryl 25 mg, Zyrtec, Claritin, or Allegra and increase your fluid intake. (Patients taking Tikosyn should avoid Benadryl, and may take Zyrtec, Claritin, or Allegra) If you experience trouble breathing, this can be serious. If it is severe call 911 IMMEDIATELY. If it is  mild, please call our office.  For more information and frequently asked questions, please visit our website : [email or web address]  For non-scheduling related questions, please contact the cardiac imaging nurse navigator should you have any questions/concerns: Cardiac Imaging Nurse Navigators Direct Office Dial: [phone]   For scheduling needs, including cancellations and rescheduling, please call  Brittany, [phone].

## 2024-01-01 ENCOUNTER — Ambulatory Visit: Admitting: Podiatry

## 2024-01-01 ENCOUNTER — Encounter: Payer: Self-pay | Admitting: Podiatry

## 2024-01-01 DIAGNOSIS — E1140 Type 2 diabetes mellitus with diabetic neuropathy, unspecified: Secondary | ICD-10-CM

## 2024-01-01 DIAGNOSIS — L84 Corns and callosities: Secondary | ICD-10-CM | POA: Diagnosis not present

## 2024-01-01 DIAGNOSIS — Z89432 Acquired absence of left foot: Secondary | ICD-10-CM | POA: Diagnosis not present

## 2024-01-01 DIAGNOSIS — Z794 Long term (current) use of insulin: Secondary | ICD-10-CM | POA: Diagnosis not present

## 2024-01-01 NOTE — Progress Notes (Signed)
 Subjective:   Patient ID: Danny Fuller, male   DOB: 48 y.o.   MRN: [id number]   HPI Chief Complaint  Patient presents with   Diabetic Ulcer    Rm12 Follow up ulcerations bilateral feet.    48 year old male presents the office today for follow-up evaluation of pre-ulcerative calluses bilaterally.  He states he has not been as sores previously noted.  He does not report any open lesions or any areas of drainage or bleeding.  He does not report any fevers or chills.  He has no other concerns today.    Still waiting on new diabetic shoes, insoles.  Last A1c was 9.8 on October 19, 2023     Objective:  Physical Exam  General: AAO x3, NAD  Dermatological: Thick hyperkeratotic lesions noted to the left heel as well as right submetatarsal 1.  Upon debridement there was very minimal pinpoint bleeding noted to both lesions as there is some dried blood present the callus but after debridement there is no other ulceration identified at this time.  The callus is not as thick as what it had been previously.  There is no surrounding erythema, ascending cellulitis.  No drainage or pus.  No fluctuation or crepitation.  No malodor.  Vascular: Dorsalis Pedis artery and Posterior Tibial artery pedal pulses are palpable bilateral with immedate capillary fill time.  There is no pain with calf compression, swelling, warmth, erythema.   Neruologic: Sensation decreased  Musculoskeletal: Left transmetatarsal amputation.  Prominent submetatarsal 1 right foot.  He gets tenderness on the hyperkeratotic lesions.      Assessment:   Bilateral preulcerative calluses; symptomatic onychomycosis     Plan:  -Treatment options discussed including all alternatives, risks, and complications -Etiology of symptoms were discussed -Sharply debrided hyperkeratotic lesions x 2.  Again very minimal bleeding noted.  Although the calluses are still thick they are not quite as thick as what they have been previously.   Continue with moisturizer, offloading.  Will place to get new diabetic shoes with insoles this will help decrease pressure.     Return in about 3 weeks (around 01/22/2024).  [name] DPM

## 2024-01-01 NOTE — Progress Notes (Addendum)
 Danny Fuller                                          MRN: [id number]   01/01/2024   The [facility] Quality Team Specialist reviewed this patient medical record for the purposes of chart review for care gap closure. The following were reviewed: chart review for care gap closure-glycemic status assessment.  Chart reviewed for Col screening, no record found.  02/13/2024- No GSD to close.  03/05/2024- no GSD to close 2025   [facility] Quality Team

## 2024-01-03 ENCOUNTER — Encounter (HOSPITAL_BASED_OUTPATIENT_CLINIC_OR_DEPARTMENT_OTHER)

## 2024-01-07 ENCOUNTER — Encounter (HOSPITAL_BASED_OUTPATIENT_CLINIC_OR_DEPARTMENT_OTHER): Payer: Self-pay | Admitting: Family

## 2024-01-07 ENCOUNTER — Ambulatory Visit (INDEPENDENT_AMBULATORY_CARE_PROVIDER_SITE_OTHER): Admitting: Family

## 2024-01-07 ENCOUNTER — Ambulatory Visit (HOSPITAL_COMMUNITY)
Admission: RE | Admit: 2024-01-07 | Discharge: 2024-01-07 | Disposition: A | Discharge status: Home / Self Care | Source: Ambulatory Visit | Attending: Family | Admitting: Family

## 2024-01-07 VITALS — BP 160/96 | HR 63 | Ht 70.0 in | Wt 243.0 lb

## 2024-01-07 DIAGNOSIS — D6859 Other primary thrombophilia: Secondary | ICD-10-CM | POA: Diagnosis not present

## 2024-01-07 DIAGNOSIS — I1A0 Resistant hypertension: Secondary | ICD-10-CM

## 2024-01-07 DIAGNOSIS — I10 Essential (primary) hypertension: Secondary | ICD-10-CM | POA: Insufficient documentation

## 2024-01-07 DIAGNOSIS — G4733 Obstructive sleep apnea (adult) (pediatric): Secondary | ICD-10-CM

## 2024-01-07 DIAGNOSIS — I480 Paroxysmal atrial fibrillation: Secondary | ICD-10-CM | POA: Diagnosis not present

## 2024-01-07 NOTE — Patient Instructions (Addendum)
 Medication Instructions:   Start Kerendia as discussed with nephrology  This medication helps to lower blood pressure, protect your kidney function, and reduce your risk of heart attack.    Testing/Procedures: Proceed with renal artery duplex later today as scheduled.    Follow-Up: Please follow up in 2-3 months in [facility] with Dr. Raford, Reche Finder, NP or Allean Mink PharmD    Special Instructions:   We have sent a prescription for a blood pressure cuff to [facility]. Some Medicare plans have an over the counter benefit it cover BP cuffs. If your insurance does not cover the BP cuff, please let us  know.

## 2024-01-07 NOTE — Progress Notes (Signed)
 [facility] Assessment:    Date:  01/07/2024   ID:  KAJ VASIL, DOB 1975-11-22, MRN [id number]  PCP:  Johnny Garnette LABOR, MD  Cardiologist:  Lynwood Schilling, MD  Nephrologist: Dr. Ephriam Stank  Referring MD: Johnny Garnette LABOR, MD   CC: Hypertension  History of Present Illness:    Discussed the use of AI scribe software for clinical note transcription with the patient, who gave verbal consent to proceed.  History of Present Illness Danny Fuller is a 48 year old male with hypertension, DM 2, OSA, HLD, atrial fibrillation who presents for management of uncontrolled hypertension. Family history includes heart disease and hypertension in his father and maternal grandfather.   Echo 11/2021 LVEF 60 to 65%, due to strain recommended for follow-up testing.  Amyloid study equivocal.  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  This is secondary to PAF.  Diltiazem  drip started and discharged on p.o. diltiazem .  Echo preserved LVEF, moderate LVH, mildly elevated PASP, mild MR, mild to moderate TR.  Eliquis  was resumed.  He had AKI, CT abdomen pelvis possible right cortical enhancement concerning for ischemic injury, renal function improved after IVF.  He was discharged on Cardizem , olmesartan .  Labetalol  and hydralazine  were discontinued.  Established with [facility] 12/06/23. Hypertension was diagnosed at age 15. He experienced side effects from antihypertensive medications, including sluggishness and fatigue, leading to occasional non-adherence. He was taking olmesartan  and a clonidine  patch but has run not diltiazem  nor hydralazine  nor labetalol .   A CT scan in September 2025 ruled out pheochromocytoma. Thyroid   function was normal in April 2025.  At initial visit 12/06/2023 diltiazem  increased to 180 to 240mg  daily. Olmesartan  40mg  daily and clonidine  0.3mg  weekly patch continued. Renin-aldosterone ratio with no hyperaldosteronism. Renal artery duplex scheduled for 01/07/24. He was encouraged to purchase a home BP cuff. Home sleep study ordered, not yet performed.   He saw Dr. Nancey of EP 12/28/23 and is planning for ablation. Nephrologist, Dr. Stank, initiated Kerendia.   Presents for follow-up regarding his cardiac health and medication management.  He experiences a 'little flutter' in his heart, which he considers normal and not disruptive. He denies chest pain, breathing difficulties, or palpitations.  He takes clonidine  via a patch, olmesartan , diltiazem . Has not yet started Kerendia, plans to pick up today. He forgot to take olmesartan  and diltiazem  this morning. He takes Eliquis  twice daily but sometimes misses the evening dose due to a busy schedule. He has started eating with his medications in the morning to prevent nausea.  His blood pressure was 150/100 at a recent visit EP visit. In clinic today it improved from 170/92 ? 160/96 without intervention. Plans to take his Diltiazem  and Olmesartan  upon leaving clinic. He plans to check his blood pressure at a pharmacy as he does not have a blood pressure cuff at home. Rx previously sent for BP cuff to [facility], encouraged to pick up and contact us  if insurance will not cover. He experiences occasional blurry vision around midday, which he associates with his medication intake, but this is tolerable compared to prior adverse effects. Notes that his blood pressure readings have been lower since starting the medication.  Previous antihypertensives: Labetolol - felt funny Amlodipine  - now  on Diltiazem  for atrial fibrillation Lisinopril  -  Hydralazine  -  Olmesartan -hydrochlorothiazide -  Chlorthalidone  - dehydrated, always thirsty Lasix  - stopped  due to kidney function  Past Medical History:  Diagnosis Date   Arrhythmia 10/14/2017   Arthritis    Asthma    Back pain    Bulging Disk   Chronic kidney disease    Diabetes mellitus    sees Warren Batty NP at [facility]   Diabetic foot ulcers [facility])    sees Dr. Norleen Armor    GERD (gastroesophageal reflux disease)    Glaucoma    Headache(784.0)    Hyperlipidemia    Hypertension    sees Dr. Lavona    Neuromuscular disorder (HCC) 07/2015   Sleep apnea    CPAP    Past Surgical History:  Procedure Laterality Date   AMPUTATION Left 07/26/2015   Procedure: AMPUTATION LEFT FIFTH RAY;  Surgeon: Jerona LULLA Sage, MD;  Location: [facility] OR;  Service: Orthopedics;  Laterality: Left;   bilateral hip pins placed     INCISION AND DRAINAGE PERIRECTAL ABSCESS Right 03/07/2016   Procedure: IRRIGATION AND DEBRIDEMENT PERIRECTAL ABSCESS;  Surgeon: Alm Angle, MD;  Location: [facility] ORS;  Service: General;  Laterality: Right;   INCISION AND DRAINAGE PERIRECTAL ABSCESS Right 03/09/2016   Procedure: EXAM UNDER ANESTHESIA, IRRIGATION AND DEBRIDEMENT PERIRECTAL ABSCESS;  Surgeon: Donnice Lunger, MD;  Location: [facility] ORS;  Service: General;  Laterality: Right;  Open, betadine packed wound   TOE AMPUTATION Left 11/19/2019   Removal of all toes from left foot   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/20/2019   Procedure: TRANSMETATARSAL AMPUTATION LEFT FOOT;  Surgeon: Armor Norleen, MD;  Location: [facility] OR;  Service: Orthopedics;  Laterality: Left;    Current Medications: Current Meds  Medication Sig   apixaban  (ELIQUIS ) 5 MG TABS tablet Take 1 tablet (5 mg total) by mouth 2 (two) times daily.   Blood Pressure Monitoring (COMFORT TOUCH BP CUFF/LARGE) MISC 1 Units by Does not apply route daily.   cloNIDine  (CATAPRES  - DOSED IN MG/24 HR) 0.3 mg/24hr patch APPLY 1 PATCH TOPICALLY ONCE A  WEEK   cyclobenzaprine  (FLEXERIL ) 10 MG tablet Take 1 tablet (10 mg total) by mouth 3 (three) times daily as needed for  muscle spasms.   diltiazem  (CARDIZEM  CD) 240 MG 24 hr capsule Take 1 capsule (240 mg total) by mouth daily.   Dulaglutide  (TRULICITY ) 3 MG/0.5ML SOAJ Inject 3 mg as directed once a week.   insulin  aspart (NOVOLOG ) 100 UNIT/ML injection    Insulin  Disposable Pump (OMNIPOD DASH PODS, GEN 4,) MISC INJECT 1 EACH INTO THE SKIN CONTINUOUS. APPLY 1 OMNIPOD INSULIN  PUMP TO BODY DAILY FOR INSULIN    nortriptyline  (PAMELOR ) 10 MG capsule TAKE 4 CAPSULES BY MOUTH AT BEDTIME   olmesartan  (BENICAR ) 40 MG tablet Take 1 tablet (40 mg total) by mouth daily.   pravastatin  (PRAVACHOL ) 40 MG tablet TAKE 1 TABLET BY MOUTH EVERY  EVENING   [Paused] pregabalin  (LYRICA ) 150 MG capsule Take 1 capsule (150 mg total) by mouth 3 (three) times daily.   sertraline  (ZOLOFT ) 50 MG tablet TAKE 1 TABLET BY MOUTH DAILY   silver  sulfADIAZINE  (SILVADENE ) 1 % cream Apply 1 Application topically daily.   traMADol  (ULTRAM ) 50 MG tablet Take 2 tablets (100 mg total) by mouth every 6 (six) hours as needed. (Patient taking differently: Take 100 mg by mouth as needed.)     Allergies:   Augmentin  [amoxicillin -pot clavulanate] and Vancomycin    Social History   Socioeconomic History  Marital status: Married    Spouse name: Not on file   Number of children: 2   Years of education: 48   Highest education level: 12th grade  Occupational History   Occupation: disability  Tobacco Use   Smoking status: Former    Current packs/day: 0.00    Average packs/day: 0.5 packs/day for 28.2 years (14.1 ttl pk-yrs)    Types: Cigarettes    Start date: 07/23/1995    Quit date: 10/15/2023    Years since quitting: 0.2    Passive exposure: Past   Smokeless tobacco: Never   Tobacco comments:    Former smoker 10/27/21, quit smoking 10/2023  Vaping Use   Vaping status: Never Used  Substance and Sexual Activity   Alcohol use: Yes    Alcohol/week: 3.0 standard drinks of alcohol    Types: 2 Cans of beer, 1 Standard drinks or equivalent per week     Comment: 1 drink every 1-3 months 10/27/21   Drug use: Never   Sexual activity: Yes    Birth control/protection: Condom  Other Topics Concern   Not on file  Social History Narrative   Lives with wife and two children in a one story home.  Not working.  Still trying for disability.     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     Education: high school.    Left handed   One story home   Social Drivers of Health   Financial Resource Strain: Medium Risk (07/30/2023)   Overall Financial Resource Strain (CARDIA)    Difficulty of Paying Living Expenses: Somewhat hard  Food Insecurity: No Food Insecurity (01/07/2024)   Hunger Vital Sign    Worried About Running Out of Food in the Last Year: Never true    Ran Out of Food in the Last Year: Never true  Transportation Needs: No Transportation Needs (11/04/2023)   PRAPARE - Administrator, Civil Service (Medical): No    Lack of Transportation (Non-Medical): No  Physical Activity: Insufficiently Active (07/30/2023)   Exercise Vital Sign    Days of Exercise per Week: 1 day    Minutes of Exercise per Session: 20 min  Stress: No Stress Concern Present (07/30/2023)   [employer] of Occupational Health - Occupational Stress Questionnaire    Feeling of Stress: Not at all  Social Connections: Socially Integrated (07/30/2023)   Social Connection and Isolation Panel    Frequency of Communication with Friends and Family: More than three times a week    Frequency of Social Gatherings with Friends and Family: Three times a week    Attends Religious Services: More than 4 times per year    Active Member of Clubs or Organizations: Yes    Attends Engineer, Structural: More than 4 times per year    Marital Status: Married     Family History: The patient's family history includes Diabetes in his brother, mother, and sister; Heart attack in his father; Hypertension in his father and maternal grandfather.  ROS:    Please see the history of present illness.     All other systems reviewed and are negative.  EKGs/Labs/Other Studies Reviewed:         Recent Labs: 05/21/2023: TSH 1.69 11/03/2023: ALT 17; Hemoglobin 14.6; Hemoglobin 15.0; Platelets 297 11/04/2023: Magnesium  1.9 11/05/2023: BUN 11; Creatinine, Ser 1.05; Potassium 4.1; Sodium 136   Recent Lipid Panel    Component Value Date/Time   CHOL 148 05/21/2023 1006   TRIG  110.0 05/21/2023 1006   HDL 31.00 (L) 05/21/2023 1006   CHOLHDL 5 05/21/2023 1006   VLDL 22.0 05/21/2023 1006   LDLCALC 95 05/21/2023 1006   LDLDIRECT 85.0 06/05/2019 0832    Physical Exam:   VS:  BP (!) 160/96   Pulse 63   Ht 5' 10 (1.778 m)   Wt 243 lb (110.2 kg)   SpO2 99%   BMI 34.87 kg/m  , BMI Body mass index is 34.87 kg/m.  Vitals:   01/07/24 0830 01/07/24 0845  BP: (!) 170/92 (!) 160/96  Pulse: 63   Height: 5' 10 (1.778 m)   Weight: 243 lb (110.2 kg)   SpO2: 99%   BMI (Calculated): 34.87     GENERAL:  Well appearing HEENT: Pupils equal round and reactive, fundi not visualized, oral mucosa unremarkable NECK:  No jugular venous distention, waveform within normal limits, carotid upstroke brisk and symmetric, no bruits, no thyromegaly LYMPHATICS:  No cervical adenopathy LUNGS:  Clear to auscultation bilaterally HEART:  RRR.  PMI not displaced or sustained,S1 and S2 within normal limits, no S3, no S4, no clicks, no rubs, no murmurs ABD:  Flat, positive bowel sounds normal in frequency in pitch, no bruits, no rebound, no guarding, no midline pulsatile mass, no hepatomegaly, no splenomegaly EXT:  2 plus pulses throughout, no edema, no cyanosis no clubbing SKIN:  No rashes no nodules NEURO:  Cranial nerves II through XII grossly intact, motor grossly intact throughout PSYCH:  Cognitively intact, oriented to person place and time   ASSESSMENT/PLAN:    Assessment & Plan Difficult-to-control hypertension Hypertension difficult to control despite  multiple medications. Previously discussed renal denervation as a future option. - Continue olmesartan  40mg  daily, clonidine  0.3mg  weekly patch, Diltiazem  240mg  daily.  -BP elevated today in setting of not yet taking Diltiazem  or Olmesartan . Planning to add Kerendia as recommended by nephrology, to avoid changing multiple agents at once, defer further antihypertensive agent changes until assess response to Kerendia. Previously when multiple meds were changed at once intolerances were subsequently reported. -Renal artery duplex as scheduled later today.  - Encouraged to purchase upper arm cuff. Rx previously sent to pharmacy. i Discussed to monitor BP at home at least 2 hours after medications and sitting for 5-10 minutes.   Mild obstructive sleep apnea Mild obstructive sleep apnea may contribute to hypertension if untreated. Discussed CPAP benefits in lowering blood pressure. Has not used in some time. Sleeps well, does not snore.  - 12/11/23 [facility] sleep study ordered for re-evaluation, awaiting prior authorization.   Atrial fibrillation Chronic atrial fibrillation managed with Eliquis  and diltiazem . RRR by auscultation - Continue Eliquis  5mg  bid, denies bleeding complications - Continue diltiazem  240 mg daily  - Upcoming ablation 02/19/24 with Dr. Nancey on November 14th      Screening for Secondary Hypertension:     Relevant Labs/Studies:    Latest Ref Rng & Units 11/05/2023    8:03 AM 11/04/2023    5:52 AM 11/03/2023    7:42 PM  Basic Labs  Sodium 135 - 145 mmol/L 136  135  140    140   Potassium 3.5 - 5.1 mmol/L 4.1  4.2  5.0    5.0   Creatinine 0.61 - 1.24 mg/dL 8.94  8.04  7.99        Latest Ref Rng & Units 05/21/2023   10:06 AM 10/21/2021   11:18 AM  Thyroid    TSH 0.35 - 5.50 uIU/mL 1.69  1.515  Latest Ref Rng & Units 12/06/2023    9:46 AM  Renin/Aldosterone   Aldosterone 0.0 - 30.0 ng/dL 2.9   Aldos/Renin Ratio 0.0 - 30.0 4.1              12/06/2023    9:32  AM  Renovascular   Renal Artery US  Completed Yes     Disposition:    FU with MD/APP/PharmD in 2-3 months  Medication Adjustments/Labs and Tests Ordered: Current medicines are reviewed at length with the patient today.  Concerns regarding medicines are outlined above.  No orders of the defined types were placed in this encounter.  No orders of the defined types were placed in this encounter.    Signed, Reche GORMAN Finder, NP  01/07/2024 8:47 AM    [facility]

## 2024-01-23 ENCOUNTER — Ambulatory Visit

## 2024-01-23 DIAGNOSIS — E1140 Type 2 diabetes mellitus with diabetic neuropathy, unspecified: Secondary | ICD-10-CM

## 2024-01-23 DIAGNOSIS — L84 Corns and callosities: Secondary | ICD-10-CM

## 2024-01-23 DIAGNOSIS — M2141 Flat foot [pes planus] (acquired), right foot: Secondary | ICD-10-CM

## 2024-01-23 DIAGNOSIS — Z89432 Acquired absence of left foot: Secondary | ICD-10-CM

## 2024-01-23 NOTE — Progress Notes (Signed)
° °  The patient presented to the office to day to pick up diabetic shoes and 3 pr diabetic custom inserts for the right and 1 insert with toe filler for the left.     inserts were put in the shoes and the shoes were fitted to the patient.   The foot ortheses offered full contact with plantar surface and contoured the arch well.   The shoes fit well with no heel slippage or areas of pressure concern.   Instructions for break in and wear were dispensed as well as instructions for changing out the diabetic insoles every 4 months.  The  delivery documentation and break in instruction forms were signed and a copy of the paperwork was given to the patient.    Patient advised to contact us  if any problems arise.  Patient also advised on how to report any issues  Also waiting on [facility]FO [place]  brace-  He comes back in on December 12 to see Dr. Gershon-   I spoke with Lolita after the visit and she said she will have to re cast him for the brace due to swelling having gone down from the previous scan.  This was requested via the [place]  brace folks.    I will ask Dr. Gershon to let Rod know-  I will put his name on a list for Lolita to contact him when she returns so she can re cast for a new brace

## 2024-01-25 ENCOUNTER — Ambulatory Visit: Admitting: Podiatry

## 2024-01-25 VITALS — Ht 70.0 in | Wt 243.0 lb

## 2024-01-25 DIAGNOSIS — R2681 Unsteadiness on feet: Secondary | ICD-10-CM | POA: Diagnosis not present

## 2024-01-25 DIAGNOSIS — Z89432 Acquired absence of left foot: Secondary | ICD-10-CM | POA: Diagnosis not present

## 2024-01-25 DIAGNOSIS — Z794 Long term (current) use of insulin: Secondary | ICD-10-CM | POA: Diagnosis not present

## 2024-01-25 DIAGNOSIS — L84 Corns and callosities: Secondary | ICD-10-CM | POA: Diagnosis not present

## 2024-01-25 DIAGNOSIS — E1140 Type 2 diabetes mellitus with diabetic neuropathy, unspecified: Secondary | ICD-10-CM | POA: Diagnosis not present

## 2024-01-25 NOTE — Patient Instructions (Signed)
 Monitor for any signs/symptoms of infection. Call the office immediately if any occur or go directly to the emergency room. Call with any questions/concerns.

## 2024-01-25 NOTE — Progress Notes (Signed)
 Subjective:   Patient ID: Danny Fuller, male   DOB: 48 y.o.   MRN: [id number]   HPI Chief Complaint  Patient presents with   Peripheral Neuropathy    RM 13 Patient is here for diabetic neuropathy and callus trim ( bilateral).    48 year old male presents the office today for follow-up evaluation of pre-ulcerative calluses bilaterally.  States they have not been as bad has been off of his feet more. The diabetic shoes and inserts which is been helping.  He needs to be refitted for the brace on the left side.  No open lesions that he reports.  No drainage.  No acute swelling or redness.  Pain is experiencing previously has improved.  Last A1c was 9.8 on October 19, 2023     Objective:  Physical Exam  General: AAO x3, NAD  Dermatological: Thick hyperkeratotic lesions noted to the left heel as well as right submetatarsal 1.  Minimal bleeding occurred but there is no definitive skin breakdown otherwise.  There is no surrounding erythema, ascending cellulitis.  No fluctuation or crepitation.  There is no malodor.  Vascular: Dorsalis Pedis artery and Posterior Tibial artery pedal pulses are palpable bilateral with immedate capillary fill time.  There is no pain with calf compression, swelling, warmth, erythema.   Neruologic: Sensation decreased  Musculoskeletal: Left transmetatarsal amputation.  Prominent submetatarsal 1 right foot.  He gets tenderness on the hyperkeratotic lesions.      Assessment:   Bilateral preulcerative calluses; symptomatic onychomycosis     Plan:  -Treatment options discussed including all alternatives, risks, and complications -Etiology of symptoms were discussed -Sharply debrided hyperkeratotic lesions x 2.  Very minimal bleeding occurred.  I recommend spot followed by dressing.  Continue moisturizer, offloading.  Continue diabetic shoes, inserts.  I do think you will benefit from an AFO brace on the left side to help further decrease the pressure.  We will  send an order to [facility].   Return in about 3 weeks (around 02/15/2024).  [name] DPM

## 2024-01-29 ENCOUNTER — Ambulatory Visit (HOSPITAL_COMMUNITY)
Admission: RE | Admit: 2024-01-29 | Discharge: 2024-01-29 | Disposition: A | Source: Ambulatory Visit | Attending: Internal Medicine | Admitting: Internal Medicine

## 2024-01-29 ENCOUNTER — Encounter (HOSPITAL_COMMUNITY): Payer: Self-pay

## 2024-01-29 ENCOUNTER — Telehealth (HOSPITAL_COMMUNITY): Payer: Self-pay

## 2024-01-29 DIAGNOSIS — I480 Paroxysmal atrial fibrillation: Secondary | ICD-10-CM | POA: Insufficient documentation

## 2024-01-29 DIAGNOSIS — E1140 Type 2 diabetes mellitus with diabetic neuropathy, unspecified: Secondary | ICD-10-CM

## 2024-01-29 DIAGNOSIS — Z794 Long term (current) use of insulin: Secondary | ICD-10-CM | POA: Insufficient documentation

## 2024-01-29 LAB — POCT I-STAT CREATININE: Creatinine, Ser: 1.2 mg/dL (ref 0.61–1.24)

## 2024-01-29 MED ORDER — IOHEXOL 350 MG/ML SOLN
80.0000 mL | Freq: Once | INTRAVENOUS | Status: AC | PRN
  Administered 2024-01-29: 10:00:00 80 mL via INTRAVENOUS

## 2024-01-29 NOTE — Telephone Encounter (Addendum)
 Spoke with patient to complete pre-procedure call.     Health status review:  Any new medical conditions, recent signs of acute illness or been started on antibiotics? No Any recent hospitalizations or surgeries? No Any new medications started since pre-op visit? No  Follow all medication instructions prior to procedure or the procedure may be rescheduled:    Continue taking Eliquis  (Apixaban ) twice daily without missing any doses before procedure. Essential chronic medications:  No medication should be continued, unless told otherwise.  HOLD: Dulaglutide  (Trulicity ) for 1 week prior to the procedure. Last dose on Sunday, December 28.  Novolog  (Insulin  Pump): You will need to contact your PCP or Endocrinologist to get clarification on how you need to adjust this for your procedure.  On the morning of your procedure DO NOT take any medication., including Eliquis  (Apixaban ).  Nothing to eat or drink after midnight prior to your procedure.  Pre-procedure testing scheduled: CT and lab work completed on December 16.  Confirmed patient is scheduled for Atrial Fibrillation Ablation on Tuesday, January 6 with Dr. Nancey. Instructed patient to arrive at the Main Entrance A at [facility]: [address] [place], [place] [address] and check in at Admitting at 9:30 AM.  Plan to go home the same day, you will only stay overnight if medically necessary. You MUST have a responsible adult to drive you home and MUST be with you the first 24 hours after you arrive home or your procedure could be cancelled.  Informed a nurse may call a day before the procedure to confirm arrival time and ensure instructions are followed.  Patient verbalized understanding to information provided and is agreeable to proceed with procedure.   Advised to contact RN Navigator at [phone], to inform of any new medications started after call or concerns prior to procedure.

## 2024-01-30 LAB — CBC
Hematocrit: 45.3 % (ref 37.5–51.0)
Hemoglobin: 15.4 g/dL (ref 13.0–17.7)
MCH: 30.7 pg (ref 26.6–33.0)
MCHC: 34 g/dL (ref 31.5–35.7)
MCV: 90 fL (ref 79–97)
Platelets: 283 x10E3/uL (ref 150–450)
RBC: 5.02 x10E6/uL (ref 4.14–5.80)
RDW: 12.8 % (ref 11.6–15.4)
WBC: 8 x10E3/uL (ref 3.4–10.8)

## 2024-01-30 LAB — BASIC METABOLIC PANEL WITH GFR
BUN/Creatinine Ratio: 14 (ref 9–20)
BUN: 16 mg/dL (ref 6–24)
CO2: 21 mmol/L (ref 20–29)
Calcium: 9.2 mg/dL (ref 8.7–10.2)
Chloride: 101 mmol/L (ref 96–106)
Creatinine, Ser: 1.14 mg/dL (ref 0.76–1.27)
Glucose: 143 mg/dL — ABNORMAL HIGH (ref 70–99)
Potassium: 3.8 mmol/L (ref 3.5–5.2)
Sodium: 136 mmol/L (ref 134–144)
eGFR: 80 mL/min/1.73 (ref >59)

## 2024-01-31 ENCOUNTER — Ambulatory Visit: Payer: Self-pay | Admitting: Cardiovascular Disease

## 2024-02-08 ENCOUNTER — Telehealth: Payer: Self-pay

## 2024-02-08 NOTE — Progress Notes (Signed)
" ° °  02/08/2024  Patient ID: Danny Fuller Counter, male   DOB: 1975-09-10, 48 y.o.   MRN: [id number]  Pharmacy Quality Measure Review  This patient is appearing on a report for being at risk of failing the Controlling Blood Pressure measure this calendar year.   Last documented BP 160/96 on 01/07/24  Spoke with patient via telephone. He does not have a BP machine device to check at home. Has an ablation scheduled for next week and would like a way to check. Will attempt to coordinate a BP machine for patient at office. Patient aware I will reach back out when in office on Monday to discuss further.  [name], PharmD Clinical Pharmacist [phone]    "

## 2024-02-11 ENCOUNTER — Telehealth: Payer: Self-pay

## 2024-02-11 DIAGNOSIS — I10 Essential (primary) hypertension: Secondary | ICD-10-CM

## 2024-02-11 NOTE — Progress Notes (Signed)
" ° °  02/11/2024  Patient ID: Danny Fuller, male   DOB: 12/23/1975, 48 y.o.   MRN: [id number]  The patients has a diagnosis of hypertension and it is medically necessary for them to have access to a home device to monitor blood pressure.  The patient does not have readily available insurance access to a device and cannot afford to purchase a device at this time.  The patient has been counseled that they do not need to continue to receive services from [facility] to receive a device.  The patient will be given a device free of charge.   Notified patient that machine is available for pick up at front office at his convenience.  [name], PharmD Clinical Pharmacist [phone]  "

## 2024-02-15 ENCOUNTER — Ambulatory Visit: Admitting: Podiatry

## 2024-02-18 NOTE — Pre-Procedure Instructions (Signed)
 Instructed patient on the following items: Arrival time 0930 Nothing to eat or drink after midnight No meds AM of procedure Responsible person to drive you home and stay with you for 24 hrs  Have you missed any doses of anti-coagulant Eliquis - takes twice a day, missed a dose in the last week.  Don't take dose morning of procedure.  EKG morning of procedure

## 2024-02-19 ENCOUNTER — Ambulatory Visit (HOSPITAL_COMMUNITY)
Admission: RE | Admit: 2024-02-19 | Discharge: 2024-02-19 | Disposition: A | Attending: Cardiovascular Disease | Admitting: Cardiovascular Disease

## 2024-02-19 ENCOUNTER — Ambulatory Visit (HOSPITAL_COMMUNITY)
Admission: RE | Disposition: A | Discharge status: Home / Self Care | Payer: Self-pay | Source: Home / Self Care | Attending: Cardiovascular Disease

## 2024-02-19 ENCOUNTER — Ambulatory Visit (HOSPITAL_COMMUNITY): Admitting: Anesthesiology

## 2024-02-19 ENCOUNTER — Other Ambulatory Visit: Payer: Self-pay

## 2024-02-19 DIAGNOSIS — Z79899 Other long term (current) drug therapy: Secondary | ICD-10-CM | POA: Insufficient documentation

## 2024-02-19 DIAGNOSIS — I480 Paroxysmal atrial fibrillation: Secondary | ICD-10-CM | POA: Diagnosis present

## 2024-02-19 DIAGNOSIS — J45909 Unspecified asthma, uncomplicated: Secondary | ICD-10-CM

## 2024-02-19 DIAGNOSIS — Z87891 Personal history of nicotine dependence: Secondary | ICD-10-CM | POA: Insufficient documentation

## 2024-02-19 DIAGNOSIS — Z9641 Presence of insulin pump (external) (internal): Secondary | ICD-10-CM | POA: Diagnosis not present

## 2024-02-19 DIAGNOSIS — Z7901 Long term (current) use of anticoagulants: Secondary | ICD-10-CM | POA: Diagnosis not present

## 2024-02-19 DIAGNOSIS — Z794 Long term (current) use of insulin: Secondary | ICD-10-CM | POA: Diagnosis not present

## 2024-02-19 DIAGNOSIS — G4733 Obstructive sleep apnea (adult) (pediatric): Secondary | ICD-10-CM | POA: Insufficient documentation

## 2024-02-19 DIAGNOSIS — I10 Essential (primary) hypertension: Secondary | ICD-10-CM

## 2024-02-19 DIAGNOSIS — E1151 Type 2 diabetes mellitus with diabetic peripheral angiopathy without gangrene: Secondary | ICD-10-CM | POA: Insufficient documentation

## 2024-02-19 DIAGNOSIS — D6869 Other thrombophilia: Secondary | ICD-10-CM | POA: Insufficient documentation

## 2024-02-19 HISTORY — PX: ATRIAL FIBRILLATION ABLATION: EP1191

## 2024-02-19 LAB — GLUCOSE, CAPILLARY
Glucose-Capillary: 223 mg/dL — ABNORMAL HIGH (ref 70–99)
Glucose-Capillary: 238 mg/dL — ABNORMAL HIGH (ref 70–99)

## 2024-02-19 LAB — POCT ACTIVATED CLOTTING TIME: Activated Clotting Time: 245 s

## 2024-02-19 MED ORDER — HEPARIN SODIUM (PORCINE) 1000 UNIT/ML IJ SOLN
INTRAMUSCULAR | Status: DC | PRN
  Administered 2024-02-19: 4000 [IU] via INTRAVENOUS
  Administered 2024-02-19: 18000 [IU] via INTRAVENOUS

## 2024-02-19 MED ORDER — LIDOCAINE 2% (20 MG/ML) 5 ML SYRINGE
INTRAMUSCULAR | Status: DC | PRN
  Administered 2024-02-19: 100 mg via INTRAVENOUS

## 2024-02-19 MED ORDER — SODIUM CHLORIDE 0.9% FLUSH: 3.0000 mL | INTRAVENOUS | Status: DC | PRN

## 2024-02-19 MED ORDER — ACETAMINOPHEN 325 MG PO TABS: 650.0000 mg | ORAL_TABLET | ORAL | Status: DC | PRN

## 2024-02-19 MED ORDER — PHENYLEPHRINE HCL-NACL 20-0.9 MG/250ML-% IV SOLN
INTRAVENOUS | Status: DC | PRN
  Administered 2024-02-19: 20 ug/min via INTRAVENOUS

## 2024-02-19 MED ORDER — MIDAZOLAM HCL 2 MG/2ML IJ SOLN
INTRAMUSCULAR | Status: AC
  Filled 2024-02-19: qty 2

## 2024-02-19 MED ORDER — FENTANYL CITRATE (PF) 250 MCG/5ML IJ SOLN
INTRAMUSCULAR | Status: DC | PRN
  Administered 2024-02-19: 100 ug via INTRAVENOUS

## 2024-02-19 MED ORDER — SODIUM CHLORIDE 0.9 % IV SOLN: 250.0000 mL | INTRAVENOUS | Status: DC | PRN

## 2024-02-19 MED ORDER — SODIUM CHLORIDE 0.9 % IV SOLN: INTRAVENOUS | Status: DC

## 2024-02-19 MED ORDER — FENTANYL CITRATE (PF) 100 MCG/2ML IJ SOLN
INTRAMUSCULAR | Status: AC
  Filled 2024-02-19: qty 2

## 2024-02-19 MED ORDER — DEXAMETHASONE SOD PHOSPHATE PF 10 MG/ML IJ SOLN
INTRAMUSCULAR | Status: DC | PRN
  Administered 2024-02-19: 5 mg via INTRAVENOUS

## 2024-02-19 MED ORDER — PROTAMINE SULFATE 10 MG/ML IV SOLN
INTRAVENOUS | Status: DC | PRN
  Administered 2024-02-19: 50 mg via INTRAVENOUS

## 2024-02-19 MED ORDER — HEPARIN (PORCINE) IN NACL 1000-0.9 UT/500ML-% IV SOLN
INTRAVENOUS | Status: DC | PRN
  Administered 2024-02-19 (×3): 500 mL

## 2024-02-19 MED ORDER — SUGAMMADEX SODIUM 200 MG/2ML IV SOLN
INTRAVENOUS | Status: DC | PRN
  Administered 2024-02-19: 400 mg via INTRAVENOUS

## 2024-02-19 MED ORDER — PROPOFOL 10 MG/ML IV BOLUS
INTRAVENOUS | Status: DC | PRN
  Administered 2024-02-19: 200 mg via INTRAVENOUS

## 2024-02-19 MED ORDER — ONDANSETRON HCL 4 MG/2ML IJ SOLN: 4.0000 mg | Freq: Four times a day (QID) | INTRAMUSCULAR | Status: DC | PRN

## 2024-02-19 MED ORDER — PHENYLEPHRINE 80 MCG/ML (10ML) SYRINGE FOR IV PUSH (FOR BLOOD PRESSURE SUPPORT)
PREFILLED_SYRINGE | INTRAVENOUS | Status: DC | PRN
  Administered 2024-02-19: 80 ug via INTRAVENOUS

## 2024-02-19 MED ORDER — HEPARIN SODIUM (PORCINE) 1000 UNIT/ML IJ SOLN
INTRAMUSCULAR | Status: AC
  Filled 2024-02-19: qty 10

## 2024-02-19 MED ORDER — ONDANSETRON HCL 4 MG/2ML IJ SOLN
INTRAMUSCULAR | Status: DC | PRN
  Administered 2024-02-19: 4 mg via INTRAVENOUS

## 2024-02-19 MED ORDER — MIDAZOLAM HCL (PF) 2 MG/2ML IJ SOLN
INTRAMUSCULAR | Status: DC | PRN
  Administered 2024-02-19: 2 mg via INTRAVENOUS

## 2024-02-19 MED ORDER — ROCURONIUM BROMIDE 10 MG/ML (PF) SYRINGE
PREFILLED_SYRINGE | INTRAVENOUS | Status: DC | PRN
  Administered 2024-02-19: 70 mg via INTRAVENOUS
  Administered 2024-02-19: 30 mg via INTRAVENOUS

## 2024-02-19 MED ORDER — ATROPINE SULFATE 1 MG/ML IV SOLN
INTRAVENOUS | Status: DC | PRN
  Administered 2024-02-19: 1 mg via INTRAVENOUS

## 2024-02-19 NOTE — Discharge Instructions (Signed)

## 2024-02-19 NOTE — Progress Notes (Signed)
 Patient started endorsing a 7/10 chest pain, MD notified, EKG performed and reviewed by MD, patient ambulated per MD request. Patients chest pain decreased to a 2/10. Per MD ok to discharge.    Pt ambulated in the hallway, was able to void without difficulty. Spoke with MD. Incision site remains clean dry and intact. No s/s of complications. PT escorted from the unit via wheel chair to personal vehicle.

## 2024-02-19 NOTE — H&P (Signed)
 " Electrophysiology Office Note:    Date:  02/19/2024   ID:  Danny Fuller, DOB [birth year], MRN [id number]  PCP:  Johnny Garnette LABOR, MD   [facility] Providers Cardiologist:  Lynwood Schilling, MD     Referring MD: No ref. provider found   History of Present Illness:    Danny Fuller is a 49 y.o. male with a medical history significant for atrial fibrillation, HTN, DM, OSA referred for arrhythmia management.      Discussed the use of AI scribe software for clinical note transcription with the patient, who gave verbal consent to proceed.  History of Present Illness Danny Fuller Rod is a 49 year old male with atrial fibrillation who presents with recurrent atrial fibrillation episodes.  He experienced his first atrial fibrillation episode approximately two years ago. On November 08, 2023, he presented to the ER with palpitations and syncope, found to be in atrial fibrillation with a rapid ventricular response at about 200 beats per minute. This episode resulted in loss of consciousness, though he does not recall it. No further episodes have occurred since that ER visit.  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 Hydralazine  had recently been initiated for blood pressure management, after which he noticed a change in how he felt, though he cannot definitively link it to the medication. 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  He has diabetes and sleep apnea. Spironolactone was recently added to his medication regimen but has not been started as it is not yet available at the pharmacy. He monitors his blood pressure at home but requires a more accurate machine.         Today, he reports he feels well and has no complaints. SABRAaemh  EKGs/Labs/Other Studies Reviewed Today:     Echocardiogram:  TTE November 04, 2023 LVEF 60 to 65%.  Moderate  concentric LVH.  Mildly dilated left atrium.   Advanced imaging:  Cardiac MRI February 2024 Severe concentric LVH, EF 41%.  No evidence of amyloidosis.  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   EKG:         Physical Exam:    VS:  BP (!) 188/101   Pulse 91   Temp 98.4 F (36.9 C)   Ht 5' 10 (1.778 m)   Wt 109.8 kg   SpO2 99%   BMI 34.72 kg/m     Wt Readings from Last 3 Encounters:  02/19/24 109.8 kg  01/25/24 110.2 kg  01/07/24 110.2 kg     GEN: Well nourished, well developed in no acute distress CARDIAC: RRR, no murmurs, rubs, gallops RESPIRATORY:  Normal work of breathing MUSCULOSKELETAL: no edema    ASSESSMENT & PLAN:     Paroxysmal atrial fibrillation Recurrent, symptomatic Episodes with extreme RVR We discussed management options and using a shared decision making approach decided to schedule ablation. I explained risks including minor bleeding at access site, complications requiring prolonged hospital stay, surgery, pacemaker, or outcome  resulting in stroke heart attack or death.  Secondary hypercoagulable state CHA2DS2-VASc score of 5 Continue apixaban  5 mg twice daily  Obstructive sleep apnea We discussed the importance of CPAP for maintaining sinus rhythm  Hypertension BP significantly elevated today Continue clonidine  patch, diltiazem  to 40, olmesartan  40 mg Reports that spironolactone was recently prescribed by his nephrologist but he has not yet picked it up I advised him  to go and start taking the spironolactone  Type 2 diabetes On insulin  pump    Signed, [name], MD  02/19/2024 10:41 AM    [facility]  "

## 2024-02-19 NOTE — Anesthesia Preprocedure Evaluation (Addendum)
 "                                  Anesthesia Evaluation  Patient identified by MRN, date of birth, ID band Patient awake    Reviewed: Allergy & Precautions, NPO status , Patient's Chart, lab work & pertinent test results  Airway Mallampati: III  TM Distance: >3 FB Neck ROM: Full    Dental no notable dental hx. (+) Poor Dentition, Missing, Edentulous Upper   Pulmonary asthma , sleep apnea and Continuous Positive Airway Pressure Ventilation , former smoker   Pulmonary exam normal        Cardiovascular hypertension, Pt. on medications + Peripheral Vascular Disease  + dysrhythmias Atrial Fibrillation  Rhythm:Irregular Rate:Normal  ECHO: IMPRESSIONS   1. Left ventricular ejection fraction, by estimation, is 60 to 65%. The  left ventricle has normal function. The left ventricle has no regional  wall motion abnormalities. There is moderate concentric left ventricular  hypertrophy. Left ventricular  diastolic parameters were normal. The average left ventricular global  longitudinal strain is -15.6 %. The global longitudinal strain is  abnormal.   2. Right ventricular systolic function is normal. The right ventricular  size is normal. There is mildly elevated pulmonary artery systolic  pressure. The estimated right ventricular systolic pressure is 35.0 mmHg.   3. Left atrial size was mildly dilated.   4. The mitral valve is normal in structure. Mild mitral valve  regurgitation.   5. Tricuspid valve regurgitation is mild to moderate.   6. The aortic valve is tricuspid. Aortic valve regurgitation is not  visualized. No aortic stenosis is present.   7. The inferior vena cava is dilated in size with >50% respiratory  variability, suggesting right atrial pressure of 8 mmHg.   Comparison(s): Prior images reviewed side by side. Mean left atrial  pressure is higher. Right atrial pressure is higher.     Neuro/Psych  Headaches  Anxiety Depression       GI/Hepatic Neg  liver ROS,GERD  ,,  Endo/Other  diabetes, Type 2, Insulin  Dependent    Renal/GU Renal disease  negative genitourinary   Musculoskeletal  (+) Arthritis , Osteoarthritis,    Abdominal Normal abdominal exam  (+)   Peds  Hematology Lab Results      Component                Value               Date                      WBC                      8.0                 01/29/2024                HGB                      15.4                01/29/2024                HCT                      45.3  01/29/2024                MCV                      90                  01/29/2024                PLT                      283                 01/29/2024             Lab Results      Component                Value               Date                      NA                       136                 01/29/2024                K                        3.8                 01/29/2024                CO2                      21                  01/29/2024                GLUCOSE                  143 (H)             01/29/2024                BUN                      16                  01/29/2024                CREATININE               1.14                01/29/2024                CALCIUM                   9.2                 01/29/2024                GFR                      75.88               05/21/2023  EGFR                     80                  01/29/2024                GFRNONAA                 >60                 11/05/2023              Anesthesia Other Findings   Reproductive/Obstetrics                              Anesthesia Physical Anesthesia Plan  ASA: 3  Anesthesia Plan: General   Post-op Pain Management:    Induction: Intravenous  PONV Risk Score and Plan: 2 and Ondansetron , Dexamethasone , Midazolam  and Treatment may vary due to age or medical condition  Airway Management Planned: Mask, Oral ETT and Video Laryngoscope  Planned  Additional Equipment: None  Intra-op Plan:   Post-operative Plan: Extubation in OR  Informed Consent: I have reviewed the patients History and Physical, chart, labs and discussed the procedure including the risks, benefits and alternatives for the proposed anesthesia with the patient or authorized representative who has indicated his/her understanding and acceptance.     Dental advisory given  Plan Discussed with: CRNA  Anesthesia Plan Comments:          Anesthesia Quick Evaluation  "

## 2024-02-19 NOTE — Transfer of Care (Signed)
 Immediate Anesthesia Transfer of Care Note  Patient: Pearley KATHEE Counter  Procedure(s) Performed: ATRIAL FIBRILLATION ABLATION  Patient Location: Cath Lab  Anesthesia Type:General  Level of Consciousness: awake, alert , and oriented  Airway & Oxygen Therapy: Patient Spontanous Breathing and Patient connected to nasal cannula oxygen  Post-op Assessment: Report given to RN and Post -op Vital signs reviewed and stable  Post vital signs: Reviewed and stable  Last Vitals:  Vitals Value Taken Time  BP 144/88 02/19/24 13:25  Temp    Pulse 99 02/19/24 13:30  Resp 0 02/19/24 13:30  SpO2 94 % 02/19/24 13:30  Vitals shown include unfiled device data.  Last Pain:  Vitals:   02/19/24 1004  PainSc: 0-No pain         Complications: There were no known notable events for this encounter.

## 2024-02-19 NOTE — Anesthesia Procedure Notes (Addendum)
 Procedure Name: Intubation Date/Time: 02/19/2024 11:27 AM  Performed by: Hedy Jarred, CRNAPre-anesthesia Checklist: Patient identified, Emergency Drugs available, Suction available and Patient being monitored Patient Re-evaluated:Patient Re-evaluated prior to induction Oxygen Delivery Method: Circle system utilized Preoxygenation: Pre-oxygenation with 100% oxygen Induction Type: IV induction Ventilation: Two handed mask ventilation required and Oral airway inserted - appropriate to patient size Laryngoscope Size: Glidescope and 4 Grade View: Grade I Tube type: Oral Tube size: 7.5 mm Number of attempts: 1 Airway Equipment and Method: Oral airway and Rigid stylet Placement Confirmation: ETT inserted through vocal cords under direct vision, positive ETCO2 and breath sounds checked- equal and bilateral Secured at: 23 cm Tube secured with: Tape Dental Injury: Teeth and Oropharynx as per pre-operative assessment

## 2024-02-19 NOTE — Anesthesia Postprocedure Evaluation (Signed)
"   Anesthesia Post Note  Patient: Danny Fuller  Procedure(s) Performed: ATRIAL FIBRILLATION ABLATION     Patient location during evaluation: PACU Anesthesia Type: General Level of consciousness: awake and alert Pain management: pain level controlled Vital Signs Assessment: post-procedure vital signs reviewed and stable Respiratory status: spontaneous breathing, nonlabored ventilation, respiratory function stable and patient connected to nasal cannula oxygen Cardiovascular status: blood pressure returned to baseline and stable Postop Assessment: no apparent nausea or vomiting Anesthetic complications: no   There were no known notable events for this encounter.  Last Vitals:  Vitals:   02/19/24 1445 02/19/24 1600  BP: (!) 163/101 (!) 157/94  Pulse: 96 94  Resp:  18  Temp:    SpO2: 96% 97%    Last Pain:  Vitals:   02/19/24 1411  PainSc: 0-No pain                 Cordella P Linsi Humann      "

## 2024-02-20 ENCOUNTER — Telehealth (HOSPITAL_COMMUNITY): Payer: Self-pay

## 2024-02-20 ENCOUNTER — Encounter (HOSPITAL_COMMUNITY): Payer: Self-pay | Admitting: Cardiovascular Disease

## 2024-02-20 MED FILL — Fentanyl Citrate Preservative Free (PF) Inj 100 MCG/2ML: INTRAMUSCULAR | Qty: 2 | Status: AC

## 2024-02-20 NOTE — Telephone Encounter (Signed)
 Attempted to reach patient to follow up with procedure completed on 02/19/24, no answer. Unable to leave message- VM not set up.

## 2024-02-21 ENCOUNTER — Institutional Professional Consult (permissible substitution) (HOSPITAL_BASED_OUTPATIENT_CLINIC_OR_DEPARTMENT_OTHER): Admitting: Family

## 2024-02-25 ENCOUNTER — Ambulatory Visit (INDEPENDENT_AMBULATORY_CARE_PROVIDER_SITE_OTHER): Admitting: Neurology

## 2024-02-25 ENCOUNTER — Encounter: Payer: Self-pay | Admitting: Neurology

## 2024-02-25 ENCOUNTER — Other Ambulatory Visit (HOSPITAL_BASED_OUTPATIENT_CLINIC_OR_DEPARTMENT_OTHER): Payer: Self-pay | Admitting: Family

## 2024-02-25 ENCOUNTER — Ambulatory Visit (INDEPENDENT_AMBULATORY_CARE_PROVIDER_SITE_OTHER): Admitting: Podiatry

## 2024-02-25 VITALS — BP 202/110 | HR 104 | Ht 70.0 in | Wt 259.0 lb

## 2024-02-25 DIAGNOSIS — L84 Corns and callosities: Secondary | ICD-10-CM

## 2024-02-25 DIAGNOSIS — B351 Tinea unguium: Secondary | ICD-10-CM

## 2024-02-25 DIAGNOSIS — I10 Essential (primary) hypertension: Secondary | ICD-10-CM

## 2024-02-25 DIAGNOSIS — E1140 Type 2 diabetes mellitus with diabetic neuropathy, unspecified: Secondary | ICD-10-CM

## 2024-02-25 DIAGNOSIS — Z7901 Long term (current) use of anticoagulants: Secondary | ICD-10-CM | POA: Diagnosis not present

## 2024-02-25 DIAGNOSIS — E1142 Type 2 diabetes mellitus with diabetic polyneuropathy: Secondary | ICD-10-CM

## 2024-02-25 DIAGNOSIS — Z794 Long term (current) use of insulin: Secondary | ICD-10-CM | POA: Diagnosis not present

## 2024-02-25 MED ORDER — HYDRALAZINE HCL 25 MG PO TABS: 25.0000 mg | ORAL_TABLET | Freq: Three times a day (TID) | ORAL | 0 refills | Status: DC | PRN

## 2024-02-25 NOTE — Progress Notes (Signed)
 "   Follow-up Visit   Date: 02/25/2024    Danny Fuller MRN: [id number] DOB: 01-03-1976   Interim History: Danny Fuller is a 49 y.o. 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  The patient was accompanied to the clinic by self.  Discussed the use of AI scribe software for clinical note transcription with the patient, who gave verbal consent to proceed.  History of Present Illness He underwent an ablation procedure last Tuesday for atrial fibrillation. He was informed that he might experience an irregular or accelerated heartbeat for a week post-procedure, but he feels the same as before and has not experienced any fibrillation since the procedure. He restarted his medications yesterday after a delay due to misreading instructions. He was taking Eliquis  but had paused his blood pressure medication and diltiazem . His blood pressure is markedly elevated today, 211/127. He attributes the recent high reading to being off his medications since last Tuesday. No vision changes or headaches. No chest pain or shortness of breath.  He experiences pain at the wound site in the groin from the procedure, describing it as feeling like 'a rubber band stretching' when he moves. The pain has improved over the past few days but remains significant, especially when moving around.  He was told to hold Lyrica  and nortriptyline  since his last week.  He has not noticed increased pain in the feet. He notes increased aches at night since stopping Lyrica  and experiences numbness in his feet, particularly in the right foot, with occasional tingling in the left leg. He has a history of neuropathy. He used to experience more pain, but now primarily feels numbness, especially in the right foot.   Medications:  Current Outpatient Medications on File Prior to Visit   Medication Sig Dispense Refill   apixaban  (ELIQUIS ) 5 MG TABS tablet Take 1 tablet (5 mg total) by mouth 2 (two) times daily. 180 tablet 0   Blood Pressure Monitoring (COMFORT TOUCH BP CUFF/LARGE) MISC 1 Units by Does not apply route daily. 1 each 0   Chlorphen-Phenyleph-ASA (ALKA-SELTZER PLUS COLD PO) Take 1 tablet by mouth daily as needed (Cold).     cloNIDine  (CATAPRES  - DOSED IN MG/24 HR) 0.3 mg/24hr patch APPLY 1 PATCH TOPICALLY ONCE A  WEEK 12 patch 3   cyclobenzaprine  (FLEXERIL ) 10 MG tablet Take 1 tablet (10 mg total) by mouth 3 (three) times daily as needed for muscle spasms. 90 tablet 5   diltiazem  (CARDIZEM  CD) 240 MG 24 hr capsule Take 1 capsule (240 mg total) by mouth daily. 30 capsule 2   Dulaglutide  (TRULICITY ) 3 MG/0.5ML SOAJ Inject 3 mg as directed once a week.     HUMALOG  100 UNIT/ML injection Inject into the skin See admin instructions. Insulin  pump     insulin  aspart (NOVOLOG ) 100 UNIT/ML injection daily as needed for high blood sugar.     Insulin  Disposable Pump (OMNIPOD DASH PODS, GEN 4,) MISC INJECT 1 EACH INTO THE SKIN CONTINUOUS. APPLY 1 OMNIPOD INSULIN  PUMP TO BODY DAILY FOR INSULIN  30 each 1   KERENDIA 10 MG TABS Take 10 mg by mouth daily.     pravastatin  (PRAVACHOL ) 40 MG tablet TAKE 1 TABLET BY MOUTH EVERY  EVENING 100 tablet 0   traMADol  (ULTRAM ) 50 MG tablet Take 2 tablets (100 mg total) by mouth every 6 (six) hours as needed. 240 tablet 1   nortriptyline  (  PAMELOR ) 10 MG capsule TAKE 4 CAPSULES BY MOUTH AT BEDTIME (Patient not taking: Reported on 02/25/2024) 360 capsule 3   olmesartan  (BENICAR ) 40 MG tablet Take 1 tablet (40 mg total) by mouth daily. (Patient not taking: Reported on 02/25/2024) 30 tablet 2   [Paused] pregabalin  (LYRICA ) 150 MG capsule Take 1 capsule (150 mg total) by mouth 3 (three) times daily. (Patient not taking: Reported on 02/25/2024) 90 capsule 5   sertraline  (ZOLOFT ) 50 MG tablet TAKE 1 TABLET BY MOUTH DAILY (Patient not taking: Reported on  02/25/2024) 100 tablet 2   silver  sulfADIAZINE  (SILVADENE ) 1 % cream Apply 1 Application topically daily. (Patient not taking: Reported on 02/25/2024) 50 g 2   No current facility-administered medications on file prior to visit.    Allergies:  Allergies  Allergen Reactions   Augmentin  [Amoxicillin -Pot Clavulanate] Itching and Other (See Comments)    Dizziness   Vancomycin  Other (See Comments)    AKI    Vital Signs:  BP (!) 211/127   Pulse (!) 104   Ht 5' 10 (1.778 m)   Wt 259 lb (117.5 kg)   SpO2 100%   BMI 37.16 kg/m   Neurological Exam: MENTAL STATUS including orientation to time, place, person, recent and remote memory, attention span and concentration, language, and fund of knowledge is normal.  Speech is not dysarthric.  CRANIAL NERVES:  Normal conjugate, extra-ocular eye movements in all directions of gaze.  No ptosis.  MOTOR:  Motor strength is 5/5 in all extremities, except left finger abductors 4/5, right toe extensors and flexors 4/5. s/p left mid-foot amputation  MSRs:  Reflexes are 2+/4 in the upper extremities, 1+ at the left patella, and absent left patella and ankles  SENSORY:  Vibration is absent below the ankles  COORDINATION/GAIT:  Gait appears mildly wide-based, stable.    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  Overall, these findings are severe in degree electrically. 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  NCS/EMG of the upper extremities 07/31/2017:  The electrophysiologic findings are most suggestive of a sensorimotor polyradiculoneuropathy affecting the upper extremities, moderate in degree electrically, which is new when compared to his previous study dated 12/31/2016. Alternatively, progression of sensorimotor demyelinating and axonal  polyneuropathy with a superimposed ulnar neuropathy across the elbow (worse on the left), cannot be excluded. Correlate clinically.  MRI lumbar spine wo contrast 08/22/2017: 1. 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 The left foramen also appears mildly narrowed. 2. Mild left foraminal narrowing at L5-S1 without definite nerve root encroachment. 3. No other significant acquired disc space findings. Congenitally short pedicles.  Lab Results  Component Value Date   HGBA1C 9.8 (A) 10/19/2023    Lab Results  Component Value Date   CREATININE 1.14 01/29/2024   BUN 16 01/29/2024   NA 136 01/29/2024   K 3.8 01/29/2024   CL 101 01/29/2024   CO2 21 01/29/2024    IMPRESSION/PLAN: 1.  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  Neuropathy is stable and has transition to more numbness than pain.  - Continue to monitor symptoms off nortriptyline  and Lyrica  as he reports more numbness and less pain -  Patient educated on daily foot inspection, fall prevention, and safety precautions around the home.   3. 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  Elevated blood pressure (211/127, 202/110)  with history of refractory hypertension.  Patient's cardiology team was contacted who recommended that patient restart all BP medications, as he did not take clonidine .  They have sent hydralazine  to help lower BP today and were able to arrange follow-up tomorrow.  Fortunately, he is asymptomatic.     Return to clinic in 6 months   Thank you for allowing me to participate in patient's care.  If I can answer any additional questions, I would be pleased to do so.    Sincerely,    Kayelyn Lemon K. Tobie, DO   "

## 2024-02-25 NOTE — Progress Notes (Unsigned)
 Subjective:   Patient ID: Danny Fuller, male   DOB: 49 y.o.   MRN: [id number]   HPI Chief Complaint  Patient presents with   [facility]    IDDM Patient with an A1c of 9 presents today for [facility] and callous maintenance , patient  denies tingling       49 year old male presents the office today for follow-up evaluation of pre-ulcerative calluses bilaterally.  States he has been doing better.  He does not report any open lesions, bleeding or any swelling or redness.  Denies any fevers or chills.   Nails on the right foot are also thickened and long and they tend to rub.  No open lesions.   Last A1c was 9.8 on October 19, 2023  He is on Eliquis      Objective:  Physical Exam  General: AAO x3, NAD  Dermatological: Thick hyperkeratotic lesions noted to the left heel as well as right submetatarsal 1.  Minimal bleeding occurred but there is no definitive skin breakdown otherwise.  There is no surrounding erythema, ascending cellulitis.  No fluctuation or crepitation.  There is no malodor. Nails are hypertrophic, dystrophic, brittle, discolored, elongated 5. No surrounding redness or drainage. Tenderness nails 1-5 on the right. No open lesions or pre-ulcerative lesions are identified today.   Vascular: Dorsalis Pedis artery and Posterior Tibial artery pedal pulses are palpable bilateral with immedate capillary fill time.  There is no pain with calf compression, swelling, warmth, erythema.   Neruologic: Sensation decreased  Musculoskeletal: Left transmetatarsal amputation.  Prominent submetatarsal 1 right foot.  He gets tenderness on the hyperkeratotic lesions.      Assessment:   Bilateral preulcerative calluses; symptomatic onychomycosis     Plan:  -Treatment options discussed including all alternatives, risks, and complications -Etiology of symptoms were discussed -Sharply debrided hyperkeratotic lesions x 2.  Very minimal bleeding occurred.  I recommend spot followed by dressing.   Continue moisturizer, offloading.  Continue diabetic shoes, inserts.  I do think you will benefit from an AFO brace on the left side to help further decrease the pressure.  Order previously sent to [facility] - Sharply debrided nails x 5 without complications or bleeding  Return in about 3 weeks (around 03/17/2024).  [name] DPM

## 2024-02-25 NOTE — Patient Instructions (Addendum)
 Please take all your blood pressure medications and pick up hydralazine  from pharmacy today.  You will see your cardiologist tomorrow for follow-up at 10:30 am. Your cardiologist has sent in a prescription for Hydralazine  25mg  to be taken PRN today for SBP >180. sent to [facility].  OK to stop nortriptyline  and Lyrica .

## 2024-02-26 ENCOUNTER — Encounter (HOSPITAL_BASED_OUTPATIENT_CLINIC_OR_DEPARTMENT_OTHER): Payer: Self-pay | Admitting: Family

## 2024-02-26 ENCOUNTER — Ambulatory Visit (INDEPENDENT_AMBULATORY_CARE_PROVIDER_SITE_OTHER): Admitting: Family

## 2024-02-26 VITALS — BP 168/108 | HR 97 | Ht 71.0 in | Wt 251.4 lb

## 2024-02-26 DIAGNOSIS — D6859 Other primary thrombophilia: Secondary | ICD-10-CM | POA: Diagnosis not present

## 2024-02-26 DIAGNOSIS — I480 Paroxysmal atrial fibrillation: Secondary | ICD-10-CM

## 2024-02-26 DIAGNOSIS — I10 Essential (primary) hypertension: Secondary | ICD-10-CM | POA: Diagnosis not present

## 2024-02-26 DIAGNOSIS — G4733 Obstructive sleep apnea (adult) (pediatric): Secondary | ICD-10-CM | POA: Diagnosis not present

## 2024-02-26 MED ORDER — DILTIAZEM HCL ER COATED BEADS 300 MG PO CP24: 300.0000 mg | ORAL_CAPSULE | Freq: Every day | ORAL | 3 refills | Status: AC

## 2024-02-26 NOTE — Patient Instructions (Signed)
 Medication Instructions:   CHANGE Diltiazem  CD one (1) tablet by mouth ( 300 mg) daily.   *If you need a refill on your cardiac medications before your next appointment, please call your pharmacy*  Lab Work:  None ordered.  If you have labs (blood work) drawn today and your tests are completely normal, you will receive your results only by: MyChart Message (if you have MyChart) OR A paper copy in the mail If you have any lab test that is abnormal or we need to change your treatment, we will call you to review the results.  Testing/Procedures:  None ordered.  Follow-Up: At [facility], you and your health needs are our priority.  As part of our continuing mission to provide you with exceptional heart care, our providers are all part of one team.  This team includes your primary Cardiologist (physician) and Advanced Practice Providers or APPs (Physician Assistants and Nurse Practitioners) who all work together to provide you with the care you need, when you need it.  Your next appointment:   1 month(s)  Provider:   Reche Finder, NP    We recommend signing up for the patient portal called MyChart.  Sign up information is provided on this After Visit Summary.  MyChart is used to connect with patients for Virtual Visits (Telemedicine).  Patients are able to view lab/test results, encounter notes, upcoming appointments, etc.  Non-urgent messages can be sent to your provider as well.   To learn more about what you can do with MyChart, go to [email or web address].   Other Instructions

## 2024-02-26 NOTE — Progress Notes (Unsigned)
 "  [facility] Assessment:    Date:  02/26/2024   ID:  Danny Fuller, DOB 10-05-1975, MRN [id number]  PCP:  Johnny Garnette LABOR, MD  Cardiologist:  Lynwood Schilling, MD  Nephrologist: Dr. Ephriam Stank  Referring MD: Johnny Garnette LABOR, MD   CC: Hypertension  History of Present Illness:    Discussed the use of AI scribe software for clinical note transcription with the patient, who gave verbal consent to proceed.  History of Present Illness Danny Fuller is a 49 year old male with hypertension, DM 2, OSA, HLD, atrial fibrillation who presents for management of uncontrolled hypertension. Family history includes heart disease and hypertension in his father and maternal grandfather.   Echo 11/2021 LVEF 60 to 65%, due to strain recommended for follow-up testing.  Amyloid study equivocal.  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  This is secondary to PAF.  Diltiazem  drip started and discharged on p.o. diltiazem .  Echo preserved LVEF, moderate LVH, mildly elevated PASP, mild MR, mild to moderate TR.  Eliquis  was resumed.  He had AKI, CT abdomen pelvis possible right cortical enhancement concerning for ischemic injury, renal function improved after IVF.  He was discharged on Cardizem , olmesartan .  Labetalol  and hydralazine  were discontinued.  Established with [facility] 12/06/23. Hypertension was diagnosed at age 61. He experienced side effects from antihypertensive medications, including sluggishness and fatigue, leading to occasional non-adherence. He was taking olmesartan  and a clonidine  patch but has run not diltiazem  nor hydralazine  nor labetalol .   A CT scan in September 2025 ruled out pheochromocytoma. Thyroid   function was normal in April 2025.  At initial visit 12/06/2023 diltiazem  increased to 180 to 240mg  daily. Olmesartan  40mg  daily and clonidine  0.3mg  weekly patch continued. Renin-aldosterone ratio with no hyperaldosteronism. Renal artery duplex scheduled for 01/07/24. He was encouraged to purchase a home BP cuff. Home sleep study ordered, not yet performed.   He saw Dr. Nancey of EP 12/28/23 and is planning for ablation. Nephrologist, Dr. Stank, initiated Kerendia.   Presents for follow-up regarding his cardiac health and medication management.  He experiences a 'little flutter' in his heart, which he considers normal and not disruptive. He denies chest pain, breathing difficulties, or palpitations.  He takes clonidine  via a patch, olmesartan , diltiazem . Has not yet started Kerendia, plans to pick up today. He forgot to take olmesartan  and diltiazem  this morning. He takes Eliquis  twice daily but sometimes misses the evening dose due to a busy schedule. He has started eating with his medications in the morning to prevent nausea.  His blood pressure was 150/100 at a recent visit EP visit. In clinic today it improved from 170/92 ? 160/96 without intervention. Plans to take his Diltiazem  and Olmesartan  upon leaving clinic. He plans to check his blood pressure at a pharmacy as he does not have a blood pressure cuff at home. Rx previously sent for BP cuff to [facility], encouraged to pick up and contact us  if insurance will not cover. He experiences occasional blurry vision around midday, which he associates with his medication intake, but this is tolerable compared to prior adverse effects. Notes that his blood pressure readings have been lower since starting the medication.  ?Kerendia  Had green beans with fatback which  were high in sodium.   Note mistaking eliquis  for kerendia.  He did miss  couple days of kerendia, peeing a little bit more, discussed that likely due to resuming kerendia.   ***Send  sleep message to sleep team  Prior to ablation bp at home usually 160s/90s.   Heart rate at home most often 90s. No history of bradycardia.    Previous antihypertensives: Labetolol - felt funny Amlodipine  - now on Diltiazem  for atrial fibrillation Lisinopril  -  Hydralazine  -  Olmesartan -hydrochlorothiazide -  Chlorthalidone  - dehydrated, always thirsty Lasix  - stopped due to kidney function  Past Medical History:  Diagnosis Date   Arrhythmia 10/14/2017   Arthritis    Asthma    Back pain    Bulging Disk   Chronic kidney disease    Diabetes mellitus    sees Warren Batty NP at [facility]   Diabetic foot ulcers [facility])    sees Dr. Norleen Armor    GERD (gastroesophageal reflux disease)    Glaucoma    Headache(784.0)    Hyperlipidemia    Hypertension    sees Dr. Lavona    Neuromuscular disorder (HCC) 07/2015   Sleep apnea    CPAP    Past Surgical History:  Procedure Laterality Date   AMPUTATION Left 07/26/2015   Procedure: AMPUTATION LEFT FIFTH RAY;  Surgeon: Jerona LULLA Sage, MD;  Location: [facility] OR;  Service: Orthopedics;  Laterality: Left;   ATRIAL FIBRILLATION ABLATION N/A 02/19/2024   Procedure: ATRIAL FIBRILLATION ABLATION;  Surgeon: Nancey Eulas BRAVO, MD;  Location: [facility] INVASIVE CV LAB;  Service: Cardiovascular;  Laterality: N/A;   bilateral hip pins placed     INCISION AND DRAINAGE PERIRECTAL ABSCESS Right 03/07/2016   Procedure: IRRIGATION AND DEBRIDEMENT PERIRECTAL ABSCESS;  Surgeon: Alm Angle, MD;  Location: [facility] ORS;  Service: General;  Laterality: Right;   INCISION AND DRAINAGE PERIRECTAL ABSCESS Right 03/09/2016   Procedure: EXAM UNDER ANESTHESIA, IRRIGATION AND DEBRIDEMENT PERIRECTAL ABSCESS;  Surgeon: Donnice Lunger, MD;  Location: [facility] ORS;  Service: General;  Laterality: Right;  Open, betadine packed wound   TOE AMPUTATION Left 11/19/2019   Removal of all toes from left foot   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/20/2019   Procedure: TRANSMETATARSAL  AMPUTATION LEFT FOOT;  Surgeon: Armor Norleen, MD;  Location: [facility] OR;  Service: Orthopedics;  Laterality: Left;    Current Medications: Current Meds  Medication Sig   apixaban  (ELIQUIS ) 5 MG TABS tablet Take 1 tablet (5 mg total) by mouth 2 (two) times daily.   Blood Pressure Monitoring (COMFORT TOUCH BP CUFF/LARGE) MISC 1 Units by Does not apply route daily.   Chlorphen-Phenyleph-ASA (ALKA-SELTZER PLUS COLD PO) Take 1 tablet by mouth daily as needed (Cold).   cloNIDine  (CATAPRES  - DOSED IN MG/24 HR) 0.3 mg/24hr patch APPLY 1 PATCH TOPICALLY ONCE A  WEEK   cyclobenzaprine  (FLEXERIL ) 10 MG tablet Take 1 tablet (10 mg total) by mouth 3 (three) times daily as needed for muscle spasms.   diltiazem  (CARDIZEM  CD) 240 MG 24 hr capsule Take 1 capsule (240 mg total) by mouth daily.   Dulaglutide  (TRULICITY ) 3 MG/0.5ML SOAJ Inject 3 mg as directed once a week.   HUMALOG  100 UNIT/ML injection Inject into the skin See admin instructions. Insulin  pump   hydrALAZINE  (APRESOLINE ) 25 MG tablet Take 1 tablet (25 mg total) by mouth every 8 (eight) hours as needed. For systolic blood pressure more than 180.   insulin  aspart (NOVOLOG ) 100 UNIT/ML injection daily as needed for high blood sugar.  Insulin  Disposable Pump (OMNIPOD DASH PODS, GEN 4,) MISC INJECT 1 EACH INTO THE SKIN CONTINUOUS. APPLY 1 OMNIPOD INSULIN  PUMP TO BODY DAILY FOR INSULIN    KERENDIA 10 MG TABS Take 10 mg by mouth daily.   olmesartan  (BENICAR ) 40 MG tablet Take 1 tablet (40 mg total) by mouth daily.   pravastatin  (PRAVACHOL ) 40 MG tablet TAKE 1 TABLET BY MOUTH EVERY  EVENING   traMADol  (ULTRAM ) 50 MG tablet Take 2 tablets (100 mg total) by mouth every 6 (six) hours as needed.     Allergies:   Augmentin  [amoxicillin -pot clavulanate] and Vancomycin    Social History   Socioeconomic History   Marital status: Married    Spouse name: Not on file   Number of children: 2   Years of education: 12   Highest education level: 12th grade   Occupational History   Occupation: disability  Tobacco Use   Smoking status: Former    Current packs/day: 0.00    Average packs/day: 0.5 packs/day for 28.2 years (14.1 ttl pk-yrs)    Types: Cigarettes    Start date: 07/23/1995    Quit date: 10/15/2023    Years since quitting: 0.3    Passive exposure: Past   Smokeless tobacco: Never   Tobacco comments:    Former smoker 10/27/21, quit smoking 10/2023  Vaping Use   Vaping status: Never Used  Substance and Sexual Activity   Alcohol use: Yes    Alcohol/week: 3.0 standard drinks of alcohol    Types: 2 Cans of beer, 1 Standard drinks or equivalent per week    Comment: 1 drink every 1-3 months 10/27/21   Drug use: Never   Sexual activity: Yes    Birth control/protection: Condom  Other Topics Concern   Not on file  Social History Narrative   Lives with wife and two children in a one story home.  Not working.  Still trying for disability.     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     Education: high school.    Left handed   One story home   Social Drivers of Health   Tobacco Use: Medium Risk (02/26/2024)   Patient History    Smoking Tobacco Use: Former    Smokeless Tobacco Use: Never    Passive Exposure: Past  Physicist, Medical Strain: Medium Risk (07/30/2023)   Overall Financial Resource Strain (CARDIA)    Difficulty of Paying Living Expenses: Somewhat hard  Food Insecurity: No Food Insecurity (01/07/2024)   Epic    Worried About Programme Researcher, Broadcasting/film/video in the Last Year: Never true    Ran Out of Food in the Last Year: Never true  Transportation Needs: No Transportation Needs (11/04/2023)   Epic    Lack of Transportation (Medical): No    Lack of Transportation (Non-Medical): No  Physical Activity: Insufficiently Active (07/30/2023)   Exercise Vital Sign    Days of Exercise per Week: 1 day    Minutes of Exercise per Session: 20 min  Stress: No Stress Concern Present (07/30/2023)   [employer] of  Occupational Health - Occupational Stress Questionnaire    Feeling of Stress: Not at all  Social Connections: Socially Integrated (07/30/2023)   Social Connection and Isolation Panel    Frequency of Communication with Friends and Family: More than three times a week    Frequency of Social Gatherings with Friends and Family: Three times a week    Attends Religious Services: More than 4 times per year  Active Member of Clubs or Organizations: Yes    Attends Banker Meetings: More than 4 times per year    Marital Status: Married  Depression (PHQ2-9): Low Risk (11/07/2023)   Depression (PHQ2-9)    PHQ-2 Score: 0  Alcohol Screen: Low Risk (07/30/2023)   Alcohol Screen    Last Alcohol Screening Score (AUDIT): 1  Housing: Low Risk (11/04/2023)   Epic    Unable to Pay for Housing in the Last Year: No    Number of Times Moved in the Last Year: 0    Homeless in the Last Year: No  Utilities: Not At Risk (11/04/2023)   Epic    Threatened with loss of utilities: No  Health Literacy: Adequate Health Literacy (12/06/2023)   B1300 Health Literacy    Frequency of need for help with medical instructions: Never     Family History: The patient's family history includes Diabetes in his brother, mother, and sister; Heart attack in his father; Hypertension in his father and maternal grandfather.  ROS:   Please see the history of present illness.     All other systems reviewed and are negative.  EKGs/Labs/Other Studies Reviewed:         Recent Labs: 05/21/2023: TSH 1.69 11/03/2023: ALT 17 11/04/2023: Magnesium  1.9 01/29/2024: BUN 16; Creatinine, Ser 1.14; Hemoglobin 15.4; Platelets 283; Potassium 3.8; Sodium 136   Recent Lipid Panel    Component Value Date/Time   CHOL 148 05/21/2023 1006   TRIG 110.0 05/21/2023 1006   HDL 31.00 (L) 05/21/2023 1006   CHOLHDL 5 05/21/2023 1006   VLDL 22.0 05/21/2023 1006   LDLCALC 95 05/21/2023 1006   LDLDIRECT 85.0 06/05/2019 0832    Physical  Exam:   VS:  BP (!) 168/108 (BP Location: Right Arm, Patient Position: Sitting, Cuff Size: Large)   Pulse 97   Ht 5' 11 (1.803 m)   Wt 251 lb 6.4 oz (114 kg)   SpO2 99%   BMI 35.06 kg/m  , BMI Body mass index is 35.06 kg/m.  Vitals:   02/26/24 1041  BP: (!) 168/108  Pulse: 97  Height: 5' 11 (1.803 m)  Weight: 251 lb 6.4 oz (114 kg)  SpO2: 99%  BMI (Calculated): 35.08    GENERAL:  Well appearing HEENT: Pupils equal round and reactive, fundi not visualized, oral mucosa unremarkable NECK:  No jugular venous distention, waveform within normal limits, carotid upstroke brisk and symmetric, no bruits, no thyromegaly LYMPHATICS:  No cervical adenopathy LUNGS:  Clear to auscultation bilaterally HEART:  RRR.  PMI not displaced or sustained,S1 and S2 within normal limits, no S3, no S4, no clicks, no rubs, no murmurs ABD:  Flat, positive bowel sounds normal in frequency in pitch, no bruits, no rebound, no guarding, no midline pulsatile mass, no hepatomegaly, no splenomegaly EXT:  2 plus pulses throughout, no edema, no cyanosis no clubbing SKIN:  No rashes no nodules NEURO:  Cranial nerves II through XII grossly intact, motor grossly intact throughout PSYCH:  Cognitively intact, oriented to person place and time   ASSESSMENT/PLAN:    Assessment & Plan Difficult-to-control hypertension Hypertension difficult to control despite multiple medications. Previously discussed renal denervation as a future option. - Continue olmesartan  40mg  daily, clonidine  0.3mg  weekly patch, Diltiazem  240mg  daily.  -BP elevated today in setting of not yet taking Diltiazem  or Olmesartan . Planning to add Kerendia as recommended by nephrology, to avoid changing multiple agents at once, defer further antihypertensive agent changes until assess response to Kerendia. Previously  when multiple meds were changed at once intolerances were subsequently reported. -Renal artery duplex as scheduled later today.  -  Encouraged to purchase upper arm cuff. Rx previously sent to pharmacy. i Discussed to monitor BP at home at least 2 hours after medications and sitting for 5-10 minutes. *** -CHANGE Diltiazem  to 300mg d aily R-Rx   Mild obstructive sleep apnea Mild obstructive sleep apnea may contribute to hypertension if untreated. Discussed CPAP benefits in lowering blood pressure. Has not used in some time. Sleeps well, does not snore.  - 12/11/23 [place] sleep study ordered for re-evaluation, awaiting prior authorization.   Atrial fibrillation Chronic atrial fibrillation managed with Eliquis  and diltiazem . RRR by auscultation - Continue Eliquis  5mg  bid, denies bleeding complications - Continue diltiazem  240 mg daily  - Upcoming ablation 02/19/24 with Dr. Nancey on November 14th    Screening for Secondary Hypertension:     Relevant Labs/Studies:    Latest Ref Rng & Units 01/29/2024    9:55 AM 01/29/2024    9:24 AM 11/05/2023    8:03 AM  Basic Labs  Sodium 134 - 144 mmol/L 136   136   Potassium 3.5 - 5.2 mmol/L 3.8   4.1   Creatinine 0.76 - 1.27 mg/dL 8.85  8.79  8.94        Latest Ref Rng & Units 05/21/2023   10:06 AM 10/21/2021   11:18 AM  Thyroid    TSH 0.35 - 5.50 uIU/mL 1.69  1.515        Latest Ref Rng & Units 12/06/2023    9:46 AM  Renin/Aldosterone   Aldosterone 0.0 - 30.0 ng/dL 2.9   Aldos/Renin Ratio 0.0 - 30.0 4.1              01/07/2024   10:33 AM  Renovascular   Renal Artery US  Completed Yes     Disposition:    FU with MD/APP/PharmD in *** months  Medication Adjustments/Labs and Tests Ordered: Current medicines are reviewed at length with the patient today.  Concerns regarding medicines are outlined above.  No orders of the defined types were placed in this encounter.  No orders of the defined types were placed in this encounter.    Signed, Reche GORMAN Finder, NP  02/26/2024 10:49 AM    [facility] "

## 2024-02-27 ENCOUNTER — Encounter (HOSPITAL_BASED_OUTPATIENT_CLINIC_OR_DEPARTMENT_OTHER): Payer: Self-pay | Admitting: Family

## 2024-03-03 ENCOUNTER — Ambulatory Visit: Admitting: Podiatry

## 2024-03-05 ENCOUNTER — Encounter (HOSPITAL_COMMUNITY): Payer: Self-pay

## 2024-03-11 ENCOUNTER — Encounter (HOSPITAL_COMMUNITY): Payer: Self-pay

## 2024-03-13 ENCOUNTER — Other Ambulatory Visit: Payer: Self-pay

## 2024-03-13 ENCOUNTER — Telehealth: Payer: Self-pay

## 2024-03-13 MED ORDER — PRAVASTATIN SODIUM 40 MG PO TABS: 40.0000 mg | ORAL_TABLET | Freq: Every evening | ORAL | 1 refills | Status: AC

## 2024-03-13 NOTE — Progress Notes (Signed)
" ° °  03/13/2024  Patient ID: Danny Fuller, male   DOB: [birth year], 49 y.o.   MRN: [id number]  Pharmacy Quality Measure Review  This patient is appearing on the insurance-providing list for being at risk of failing the adherence measure for Statin Use in Persons with Diabetes (SUPD) medications this calendar year.  Pravastatin  on medication list but is out of refills. Confirmed patient is still taking. Will coordinate refill request with PCP.  [name], PharmD Clinical Pharmacist [phone]  "

## 2024-03-17 ENCOUNTER — Encounter (HOSPITAL_COMMUNITY): Payer: Self-pay

## 2024-03-18 ENCOUNTER — Ambulatory Visit (HOSPITAL_COMMUNITY): Admitting: Physician Assistant

## 2024-03-19 NOTE — Progress Notes (Unsigned)
 "  [facility] Assessment:    Date:  03/19/2024   ID:  Danny Fuller, DOB Nov 02, 1975, MRN [id number]  PCP:  Johnny Garnette LABOR, MD  Cardiologist:  Lynwood Schilling, MD  Nephrologist: Dr. Ephriam Stank  Referring MD: Johnny Garnette LABOR, MD   CC: Hypertension  History of Present Illness:    Danny Fuller is a 49 y.o. male with a hx of hypertension, DM 2, OSA, HLD, atrial fibrillation. Family history includes heart disease and hypertension in his father and maternal grandfather.   Echo 11/2021 LVEF 60 to 65%, due to strain recommended for follow-up testing.  Amyloid study equivocal.  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  This is secondary to PAF.  Diltiazem  drip started and discharged on p.o. diltiazem .  Echo preserved LVEF, moderate LVH, mildly elevated PASP, mild MR, mild to moderate TR.  Eliquis  was resumed.  He had AKI, CT abdomen pelvis possible right cortical enhancement concerning for ischemic injury, renal function improved after IVF.  He was discharged on Cardizem , olmesartan .  Labetalol  and hydralazine  were discontinued.  [facility]ablished with [facility] 12/06/23. Hypertension was diagnosed at age 68. He experienced side effects from antihypertensive medications, including sluggishness and fatigue, leading to occasional non-adherence. He was taking olmesartan  and a clonidine  patch but has run not diltiazem  nor hydralazine  nor labetalol .   A CT scan in September 2025 ruled out pheochromocytoma. Thyroid  function was normal in April 2025.  At initial visit 12/06/2023 diltiazem  increased to 180 to 240mg  daily. Olmesartan  40mg  daily and clonidine  0.3mg  weekly patch continued. Renin-aldosterone ratio with no  hyperaldosteronism. Renal artery duplex scheduled for 01/07/24. He was encouraged to purchase a home BP cuff. Home sleep study ordered, not yet performed.   He saw Dr. Nancey of EP 12/28/23 and is planning for ablation. Nephrologist, Dr. Stank, initiated Kerendia.   At visit 01/07/24 with [facility] regimen of olmesartan  40mg  daily, clonidine  0.3mg  weekly patch, diltiazem  240mg  daily continued. He was encouraged to start Kerendia. To avoid multiple mediation changes at once which had previously led to reports of side effects, further medication changes were deferred.  He underwent atrial fibrillation ablation 02/19/24.   Seen 02/26/24. Had misunderstood directions, stopped all antihypertensives after ablation. He had resumed Olmesartan , Clonidine  patch, Diltiazem  the day prior. Diltiazem  increased to 300mg  daily.  Presents today for follow up independently and has been doing well since his last visit. Today his blood pressure is 130/76, he reports seeing similar numbers in the mornings at home. However, he starts trending in the 160s/90s and some 101 diastolic readings in the afternoons and evenings. He has been taking reduced dose Hydralazine  25mg  TID since his ablation, unclear when reduced.   Danny Fuller has has limited activity since his ablation but did shovel snow this past week and reports no issues with increased activity. Recent weight gain due to decreased activity with motivation to improve weight and activity levels.   Reports no shortness of breath nor dyspnea on exertion. Reports no chest pain, pressure, or tightness. No edema, orthopnea, PND. Reports no palpitations.    Previous antihypertensives: Labetolol - felt funny Amlodipine  - now on Diltiazem  for atrial fibrillation Lisinopril  -  Hydralazine  -  Olmesartan -hydrochlorothiazide -  Chlorthalidone  - dehydrated, always thirsty  Lasix  - stopped due to kidney function  Past Medical History:  Diagnosis Date    Arrhythmia 10/14/2017   Arthritis    Asthma    Back pain    Bulging Disk   Chronic kidney disease    Diabetes mellitus    sees Warren Batty NP at [facility]   Diabetic foot ulcers [facility])    sees Dr. Norleen Armor    GERD (gastroesophageal reflux disease)    Glaucoma    Headache(784.0)    Hyperlipidemia    Hypertension    sees Dr. Lavona    Neuromuscular disorder (HCC) 07/2015   Sleep apnea    CPAP    Past Surgical History:  Procedure Laterality Date   AMPUTATION Left 07/26/2015   Procedure: AMPUTATION LEFT FIFTH RAY;  Surgeon: Jerona LULLA Sage, MD;  Location: [facility] OR;  Service: Orthopedics;  Laterality: Left;   ATRIAL FIBRILLATION ABLATION N/A 02/19/2024   Procedure: ATRIAL FIBRILLATION ABLATION;  Surgeon: Nancey Eulas BRAVO, MD;  Location: [facility] INVASIVE CV LAB;  Service: Cardiovascular;  Laterality: N/A;   bilateral hip pins placed     INCISION AND DRAINAGE PERIRECTAL ABSCESS Right 03/07/2016   Procedure: IRRIGATION AND DEBRIDEMENT PERIRECTAL ABSCESS;  Surgeon: Alm Angle, MD;  Location: [facility] ORS;  Service: General;  Laterality: Right;   INCISION AND DRAINAGE PERIRECTAL ABSCESS Right 03/09/2016   Procedure: EXAM UNDER ANESTHESIA, IRRIGATION AND DEBRIDEMENT PERIRECTAL ABSCESS;  Surgeon: Donnice Lunger, MD;  Location: [facility] ORS;  Service: General;  Laterality: Right;  Open, betadine packed wound   TOE AMPUTATION Left 11/19/2019   Removal of all toes from left foot   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/20/2019   Procedure: TRANSMETATARSAL AMPUTATION LEFT FOOT;  Surgeon: Armor Norleen, MD;  Location: [facility] OR;  Service: Orthopedics;  Laterality: Left;    Current Medications: No outpatient medications have been marked as taking for the 03/20/24 encounter (Appointment) with Vannie Reche RAMAN, NP.     Allergies:   Augmentin  [amoxicillin -pot clavulanate] and Vancomycin    Social History   Socioeconomic History   Marital status: Married    Spouse name: Not on file   Number of children:  2   Years of education: 12   Highest education level: 12th grade  Occupational History   Occupation: disability  Tobacco Use   Smoking status: Former    Current packs/day: 0.00    Average packs/day: 0.5 packs/day for 28.2 years (14.1 ttl pk-yrs)    Types: Cigarettes    Start date: 07/23/1995    Quit date: 10/15/2023    Years since quitting: 0.4    Passive exposure: Past   Smokeless tobacco: Never   Tobacco comments:    Former smoker 10/27/21, quit smoking 10/2023  Vaping Use   Vaping status: Never Used  Substance and Sexual Activity   Alcohol use: Yes    Alcohol/week: 3.0 standard drinks of alcohol    Types: 2 Cans of beer, 1 Standard drinks or equivalent per week    Comment: 1 drink every 1-3 months 10/27/21   Drug use: Never   Sexual activity: Yes    Birth control/protection: Condom  Other Topics Concern   Not on file  Social History Narrative   Lives with wife and two children in a one story home.  Not working.  Still trying for disability.     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     Education: high school.    Left handed   One story home   Social Drivers  of Health   Tobacco Use: Medium Risk (02/27/2024)   Patient History    Smoking Tobacco Use: Former    Smokeless Tobacco Use: Never    Passive Exposure: Past  Physicist, Medical Strain: Medium Risk (07/30/2023)   Overall Financial Resource Strain (CARDIA)    Difficulty of Paying Living Expenses: Somewhat hard  Food Insecurity: No Food Insecurity (01/07/2024)   Epic    Worried About Programme Researcher, Broadcasting/film/video in the Last Year: Never true    Ran Out of Food in the Last Year: Never true  Transportation Needs: No Transportation Needs (11/04/2023)   Epic    Lack of Transportation (Medical): No    Lack of Transportation (Non-Medical): No  Physical Activity: Insufficiently Active (07/30/2023)   Exercise Vital Sign    Days of Exercise per Week: 1 day    Minutes of Exercise per Session: 20 min  Stress: No  Stress Concern Present (07/30/2023)   [employer] of Occupational Health - Occupational Stress Questionnaire    Feeling of Stress: Not at all  Social Connections: Socially Integrated (07/30/2023)   Social Connection and Isolation Panel    Frequency of Communication with Friends and Family: More than three times a week    Frequency of Social Gatherings with Friends and Family: Three times a week    Attends Religious Services: More than 4 times per year    Active Member of Clubs or Organizations: Yes    Attends Banker Meetings: More than 4 times per year    Marital Status: Married  Depression (PHQ2-9): Low Risk (11/07/2023)   Depression (PHQ2-9)    PHQ-2 Score: 0  Alcohol Screen: Low Risk (07/30/2023)   Alcohol Screen    Last Alcohol Screening Score (AUDIT): 1  Housing: Low Risk (11/04/2023)   Epic    Unable to Pay for Housing in the Last Year: No    Number of Times Moved in the Last Year: 0    Homeless in the Last Year: No  Utilities: Not At Risk (11/04/2023)   Epic    Threatened with loss of utilities: No  Health Literacy: Adequate Health Literacy (12/06/2023)   B1300 Health Literacy    Frequency of need for help with medical instructions: Never     Family History: The patient's family history includes Diabetes in his brother, mother, and sister; Heart attack in his father; Hypertension in his father and maternal grandfather.  ROS:   Please see the history of present illness.     All other systems reviewed and are negative.  EKGs/Labs/Other Studies Reviewed:         Recent Labs: 05/21/2023: TSH 1.69 11/03/2023: ALT 17 11/04/2023: Magnesium  1.9 01/29/2024: BUN 16; Creatinine, Ser 1.14; Hemoglobin 15.4; Platelets 283; Potassium 3.8; Sodium 136   Recent Lipid Panel    Component Value Date/Time   CHOL 148 05/21/2023 1006   TRIG 110.0 05/21/2023 1006   HDL 31.00 (L) 05/21/2023 1006   CHOLHDL 5 05/21/2023 1006   VLDL 22.0 05/21/2023 1006   LDLCALC 95  05/21/2023 1006   LDLDIRECT 85.0 06/05/2019 0832    Physical Exam:    Vitals:   03/20/24 0847  BP: 130/76  Pulse: 93  SpO2: 98%      GENERAL:  Well appearing NECK:  No jugular venous distention, waveform within normal limits, carotid upstroke brisk and symmetric, no bruits, no thyromegaly LYMPHATICS:  No cervical adenopathy LUNGS:  Clear to auscultation bilaterally HEART:  RRR.  PMI not displaced or sustained,S1 and  S2 within normal limits, no S3, no S4, no clicks, no rubs, no murmurs EXT:  2 plus pulses throughout, no edema, no cyanosis no clubbing.  SKIN:  No rashes no nodules on exposed skin  PSYCH:  Cognitively intact, oriented to person place and time   ASSESSMENT/PLAN:    Resistant HTN - Office reading 130/76 in the office with similar readings at home but trending higher in the afternoons and evenings..  Increase hydralazine  to 50 mg TID Continue olmesartan  40 mg daily, Diltiazem  300 mg daily, Clonidine  0.3mg  24 hour patch Recommend aiming for 150 minutes of moderate intensity activity per week and following a heart healthy diet.  Discussed to monitor BP at home at least 2 hours after medications and sitting for 5-10 minutes.    Mild OSA - [place] previously ordered for re-evaluation, has been routed to sleep team previously for review of prior auth.   Atrial fibrillation s/p ablation / Chronic anticoagulation - RRR today by auscultation. CHA2DS2-VASc Score = 5 [CHF History: 0, HTN History: 1, Diabetes History: 1, Stroke History: 2 (possible renal infacrt), Vascular Disease History: 1, Age Score: 0, Gender Score: 0].  Therefore, the patient's annual risk of stroke is 7.2 %.   Continue eliquis  5mg  BID, denies bleeding complications. Continue Diltaizem 300mg  daily (previously increased for BP control)   CKD stage 3b - followed by [facility]. Careful titration of diuretic and antihypertensive.     Screening for Secondary Hypertension:  Click here to document  screening for secondary causes of HTN  :[id number]}     02/26/2024    5:00 PM  Causes  Drugs/Herbals Screened     - Comments no OTC agents, some difficulty with adherence  Renovascular HTN Screened     - Comments 12/2023 duplex with no RAS  Sleep Apnea Screened     - Comments sleep study ordered  Thyroid  Disease Screened     - Comments normal TSH  Hyperaldosteronism Screened     - Comments unremarkable plasma renin-aldosterone ratio  Pheochromocytoma Screened     - Comments CT 2025 with normal adrenals    Relevant Labs/Studies:    Latest Ref Rng & Units 01/29/2024    9:55 AM 01/29/2024    9:24 AM 11/05/2023    8:03 AM  Basic Labs  Sodium 134 - 144 mmol/L 136   136   Potassium 3.5 - 5.2 mmol/L 3.8   4.1   Creatinine 0.76 - 1.27 mg/dL 8.85  8.79  8.94        Latest Ref Rng & Units 05/21/2023   10:06 AM 10/21/2021   11:18 AM  Thyroid    TSH 0.35 - 5.50 uIU/mL 1.69  1.515        Latest Ref Rng & Units 12/06/2023    9:46 AM  Renin/Aldosterone   Aldosterone 0.0 - 30.0 ng/dL 2.9   Aldos/Renin Ratio 0.0 - 30.0 4.1              01/07/2024   10:33 AM  Renovascular   Renal Artery US  Completed Yes     Disposition:    FU with MD/APP/PharmD in 3-4 months  Medication Adjustments/Labs and Tests Ordered: Current medicines are reviewed at length with the patient today.  Concerns regarding medicines are outlined above.  No orders of the defined types were placed in this encounter.  No orders of the defined types were placed in this encounter.    Signed, Reche GORMAN Finder, NP  03/19/2024 7:10 PM    [facility] "

## 2024-03-20 ENCOUNTER — Ambulatory Visit (INDEPENDENT_AMBULATORY_CARE_PROVIDER_SITE_OTHER): Admitting: Family

## 2024-03-20 ENCOUNTER — Encounter (HOSPITAL_BASED_OUTPATIENT_CLINIC_OR_DEPARTMENT_OTHER): Payer: Self-pay | Admitting: Family

## 2024-03-20 VITALS — BP 130/76 | HR 93 | Ht 71.0 in | Wt 255.8 lb

## 2024-03-20 DIAGNOSIS — G4733 Obstructive sleep apnea (adult) (pediatric): Secondary | ICD-10-CM

## 2024-03-20 DIAGNOSIS — I480 Paroxysmal atrial fibrillation: Secondary | ICD-10-CM

## 2024-03-20 DIAGNOSIS — D6859 Other primary thrombophilia: Secondary | ICD-10-CM | POA: Diagnosis not present

## 2024-03-20 DIAGNOSIS — I10 Essential (primary) hypertension: Secondary | ICD-10-CM | POA: Diagnosis not present

## 2024-03-20 MED ORDER — OLMESARTAN MEDOXOMIL 40 MG PO TABS: 40.0000 mg | ORAL_TABLET | Freq: Every day | ORAL | 1 refills | Status: AC

## 2024-03-20 MED ORDER — HYDRALAZINE HCL 50 MG PO TABS: 50.0000 mg | ORAL_TABLET | Freq: Three times a day (TID) | ORAL | 1 refills | Status: AC

## 2024-03-20 NOTE — Patient Instructions (Signed)
 Medication Instructions:   CHANGE Hydralazine  to 50mg  three times per day   Follow-Up: Please follow up in 3-4 months in [facility] with Dr. Raford, Reche Finder, NP or Allean Mink PharmD    Special Instructions:

## 2024-03-21 ENCOUNTER — Ambulatory Visit: Admitting: Podiatry

## 2024-03-25 ENCOUNTER — Ambulatory Visit (HOSPITAL_COMMUNITY): Admitting: Physician Assistant

## 2024-05-19 ENCOUNTER — Ambulatory Visit: Admitting: Pulmonary Disease

## 2024-06-30 ENCOUNTER — Encounter (HOSPITAL_BASED_OUTPATIENT_CLINIC_OR_DEPARTMENT_OTHER): Admitting: Family

## 2024-08-25 ENCOUNTER — Ambulatory Visit: Payer: Self-pay | Admitting: Neurology
